# Patient Record
Sex: Female | Born: 1935 | Race: White | Hispanic: No | Smoking: Former smoker
Health system: Southern US, Community
[De-identification: ages and names within clinical notes are randomized; demographics above are authoritative.]

## PROBLEM LIST (undated history)

## (undated) DIAGNOSIS — K279 Peptic ulcer, site unspecified, unspecified as acute or chronic, without hemorrhage or perforation: Secondary | ICD-10-CM

## (undated) DIAGNOSIS — I639 Cerebral infarction, unspecified: Secondary | ICD-10-CM

## (undated) DIAGNOSIS — M4726 Other spondylosis with radiculopathy, lumbar region: Secondary | ICD-10-CM

## (undated) DIAGNOSIS — K449 Diaphragmatic hernia without obstruction or gangrene: Secondary | ICD-10-CM

## (undated) DIAGNOSIS — K219 Gastro-esophageal reflux disease without esophagitis: Secondary | ICD-10-CM

## (undated) DIAGNOSIS — M797 Fibromyalgia: Secondary | ICD-10-CM

## (undated) DIAGNOSIS — M1990 Unspecified osteoarthritis, unspecified site: Secondary | ICD-10-CM

## (undated) DIAGNOSIS — H409 Unspecified glaucoma: Secondary | ICD-10-CM

## (undated) DIAGNOSIS — IMO0001 Reserved for inherently not codable concepts without codable children: Secondary | ICD-10-CM

## (undated) DIAGNOSIS — R42 Dizziness and giddiness: Secondary | ICD-10-CM

## (undated) DIAGNOSIS — M4800 Spinal stenosis, site unspecified: Secondary | ICD-10-CM

## (undated) DIAGNOSIS — G459 Transient cerebral ischemic attack, unspecified: Secondary | ICD-10-CM

## (undated) HISTORY — DX: Spinal stenosis, site unspecified: M48.00

## (undated) HISTORY — DX: Reserved for inherently not codable concepts without codable children: IMO0001

## (undated) HISTORY — DX: Dizziness and giddiness: R42

## (undated) HISTORY — DX: Peptic ulcer, site unspecified, unspecified as acute or chronic, without hemorrhage or perforation: K27.9

## (undated) HISTORY — DX: Transient cerebral ischemic attack, unspecified: G45.9

## (undated) HISTORY — DX: Other spondylosis with radiculopathy, lumbar region: M47.26

## (undated) HISTORY — PX: APPENDECTOMY: SHX54

## (undated) HISTORY — PX: ABDOMINAL HYSTERECTOMY: SHX81

## (undated) HISTORY — DX: Diaphragmatic hernia without obstruction or gangrene: K44.9

## (undated) HISTORY — DX: Fibromyalgia: M79.7

## (undated) HISTORY — DX: Gastro-esophageal reflux disease without esophagitis: K21.9

## (undated) HISTORY — DX: Unspecified glaucoma: H40.9

## (undated) HISTORY — DX: Cerebral infarction, unspecified: I63.9

## (undated) HISTORY — DX: Unspecified osteoarthritis, unspecified site: M19.90

---

## 2006-01-07 ENCOUNTER — Ambulatory Visit: Payer: Self-pay | Admitting: Family Medicine

## 2007-11-16 ENCOUNTER — Ambulatory Visit: Payer: Self-pay | Admitting: Family Medicine

## 2008-05-18 ENCOUNTER — Ambulatory Visit: Payer: Self-pay | Admitting: Family Medicine

## 2008-07-12 ENCOUNTER — Ambulatory Visit: Payer: Self-pay | Admitting: Pain Medicine

## 2008-07-20 ENCOUNTER — Ambulatory Visit: Payer: Self-pay | Admitting: Pain Medicine

## 2008-08-03 ENCOUNTER — Ambulatory Visit: Payer: Self-pay | Admitting: Physician Assistant

## 2008-08-10 ENCOUNTER — Ambulatory Visit: Payer: Self-pay | Admitting: Pain Medicine

## 2008-08-30 ENCOUNTER — Ambulatory Visit: Payer: Self-pay | Admitting: Physician Assistant

## 2008-08-31 ENCOUNTER — Ambulatory Visit: Payer: Self-pay | Admitting: Pain Medicine

## 2008-09-20 ENCOUNTER — Ambulatory Visit: Payer: Self-pay | Admitting: Physician Assistant

## 2009-03-21 ENCOUNTER — Ambulatory Visit: Payer: Self-pay | Admitting: Family Medicine

## 2010-01-15 ENCOUNTER — Emergency Department: Payer: Self-pay | Admitting: Internal Medicine

## 2010-05-17 ENCOUNTER — Ambulatory Visit: Payer: Self-pay | Admitting: Family Medicine

## 2010-07-04 ENCOUNTER — Ambulatory Visit: Payer: Self-pay

## 2010-07-17 ENCOUNTER — Ambulatory Visit: Payer: Self-pay | Admitting: Pain Medicine

## 2010-07-18 ENCOUNTER — Ambulatory Visit: Payer: Self-pay | Admitting: Pain Medicine

## 2010-08-05 ENCOUNTER — Ambulatory Visit: Payer: Self-pay | Admitting: Pain Medicine

## 2010-08-08 ENCOUNTER — Ambulatory Visit: Payer: Self-pay | Admitting: Pain Medicine

## 2010-09-16 ENCOUNTER — Ambulatory Visit: Payer: Self-pay | Admitting: Pain Medicine

## 2010-09-19 ENCOUNTER — Ambulatory Visit: Payer: Self-pay | Admitting: Pain Medicine

## 2010-10-07 ENCOUNTER — Ambulatory Visit: Payer: Self-pay | Admitting: Pain Medicine

## 2010-12-10 ENCOUNTER — Ambulatory Visit: Payer: Self-pay | Admitting: Pain Medicine

## 2010-12-23 ENCOUNTER — Ambulatory Visit: Payer: Self-pay | Admitting: Pain Medicine

## 2010-12-31 ENCOUNTER — Ambulatory Visit: Payer: Self-pay | Admitting: Pain Medicine

## 2011-01-28 ENCOUNTER — Ambulatory Visit: Payer: Self-pay | Admitting: Pain Medicine

## 2011-02-10 ENCOUNTER — Ambulatory Visit: Payer: Self-pay | Admitting: Pain Medicine

## 2011-04-22 ENCOUNTER — Ambulatory Visit: Payer: Self-pay | Admitting: Pain Medicine

## 2011-05-20 ENCOUNTER — Ambulatory Visit: Payer: Self-pay | Admitting: Pain Medicine

## 2011-10-08 ENCOUNTER — Ambulatory Visit: Payer: Self-pay | Admitting: Pain Medicine

## 2011-10-09 ENCOUNTER — Ambulatory Visit: Payer: Self-pay | Admitting: Pain Medicine

## 2011-10-27 ENCOUNTER — Ambulatory Visit: Payer: Self-pay | Admitting: Pain Medicine

## 2011-10-30 ENCOUNTER — Ambulatory Visit: Payer: Self-pay | Admitting: Pain Medicine

## 2011-11-26 ENCOUNTER — Ambulatory Visit: Payer: Self-pay | Admitting: Pain Medicine

## 2011-12-04 ENCOUNTER — Ambulatory Visit: Payer: Self-pay | Admitting: Pain Medicine

## 2011-12-22 ENCOUNTER — Ambulatory Visit: Payer: Self-pay | Admitting: Pain Medicine

## 2011-12-30 ENCOUNTER — Inpatient Hospital Stay: Payer: Self-pay | Admitting: Internal Medicine

## 2011-12-30 LAB — PROTIME-INR: Prothrombin Time: 13.9 secs (ref 11.5–14.7)

## 2011-12-30 LAB — COMPREHENSIVE METABOLIC PANEL
Anion Gap: 14 (ref 7–16)
BUN: 14 mg/dL (ref 7–18)
Bilirubin,Total: 0.3 mg/dL (ref 0.2–1.0)
Chloride: 105 mmol/L (ref 98–107)
Co2: 25 mmol/L (ref 21–32)
Creatinine: 0.89 mg/dL (ref 0.60–1.30)
EGFR (African American): >60
Osmolality: 288 (ref 275–301)
Potassium: 3.5 mmol/L (ref 3.5–5.1)
SGPT (ALT): 14 U/L
Total Protein: 7.1 g/dL (ref 6.4–8.2)

## 2011-12-30 LAB — URINALYSIS, COMPLETE
Bacteria: NONE SEEN
Bilirubin,UR: NEGATIVE
Blood: NEGATIVE
Glucose,UR: NEGATIVE mg/dL (ref 0–75)
Ketone: NEGATIVE
Nitrite: NEGATIVE
Ph: 5 (ref 4.5–8.0)
Specific Gravity: 1.029 (ref 1.003–1.030)
Squamous Epithelial: 9
WBC UR: 6 /HPF (ref 0–5)

## 2011-12-30 LAB — TROPONIN I: Troponin-I: <0.02 ng/mL

## 2011-12-30 LAB — CBC
HCT: 28.7 % — ABNORMAL LOW (ref 35.0–47.0)
HGB: 9.1 g/dL — ABNORMAL LOW (ref 12.0–16.0)
MCH: 23.5 pg — ABNORMAL LOW (ref 26.0–34.0)
MCHC: 31.8 g/dL — ABNORMAL LOW (ref 32.0–36.0)
MCV: 74 fL — ABNORMAL LOW (ref 80–100)
Platelet: 334 10*3/uL (ref 150–440)
RBC: 3.88 10*6/uL (ref 3.80–5.20)
WBC: 9.1 10*3/uL (ref 3.6–11.0)

## 2011-12-30 LAB — LIPASE, BLOOD: Lipase: 139 U/L (ref 73–393)

## 2011-12-30 LAB — HEMOGLOBIN: HGB: 7.9 g/dL — ABNORMAL LOW (ref 12.0–16.0)

## 2011-12-31 LAB — BASIC METABOLIC PANEL
Anion Gap: 11 (ref 7–16)
BUN: 8 mg/dL (ref 7–18)
Creatinine: 0.84 mg/dL (ref 0.60–1.30)
EGFR (African American): >60
EGFR (Non-African Amer.): >60
Glucose: 89 mg/dL (ref 65–99)
Osmolality: 283 (ref 275–301)

## 2011-12-31 LAB — CBC WITH DIFFERENTIAL/PLATELET
Basophil %: 0.4 %
Eosinophil #: 0.4 10*3/uL (ref 0.0–0.7)
HGB: 8.9 g/dL — ABNORMAL LOW (ref 12.0–16.0)
Lymphocyte %: 28.8 %
MCH: 24.4 pg — ABNORMAL LOW (ref 26.0–34.0)
Monocyte #: 0.8 10*3/uL — ABNORMAL HIGH (ref 0.0–0.7)
Neutrophil %: 54.6 %
Platelet: 273 10*3/uL (ref 150–440)
RBC: 3.65 10*6/uL — ABNORMAL LOW (ref 3.80–5.20)
WBC: 7.4 10*3/uL (ref 3.6–11.0)

## 2011-12-31 LAB — MAGNESIUM: Magnesium: 1.7 mg/dL — ABNORMAL LOW

## 2012-01-02 LAB — PATHOLOGY REPORT

## 2012-02-16 ENCOUNTER — Ambulatory Visit: Payer: Self-pay | Admitting: Pain Medicine

## 2012-02-19 ENCOUNTER — Ambulatory Visit: Payer: Self-pay | Admitting: Pain Medicine

## 2012-03-04 ENCOUNTER — Ambulatory Visit: Payer: Self-pay | Admitting: Family Medicine

## 2012-03-10 ENCOUNTER — Ambulatory Visit: Payer: Self-pay | Admitting: Pain Medicine

## 2012-04-29 ENCOUNTER — Ambulatory Visit: Payer: Self-pay | Admitting: Pain Medicine

## 2012-05-24 ENCOUNTER — Ambulatory Visit: Payer: Self-pay | Admitting: Pain Medicine

## 2012-09-08 ENCOUNTER — Ambulatory Visit: Payer: Self-pay | Admitting: Pain Medicine

## 2012-09-14 ENCOUNTER — Ambulatory Visit: Payer: Self-pay | Admitting: Pain Medicine

## 2012-10-13 ENCOUNTER — Ambulatory Visit: Payer: Self-pay | Admitting: Pain Medicine

## 2012-11-01 ENCOUNTER — Ambulatory Visit: Payer: Self-pay | Admitting: Internal Medicine

## 2013-03-03 ENCOUNTER — Ambulatory Visit: Payer: Self-pay | Admitting: Pain Medicine

## 2013-03-09 ENCOUNTER — Ambulatory Visit: Payer: Self-pay | Admitting: Pain Medicine

## 2013-06-22 ENCOUNTER — Ambulatory Visit: Payer: Self-pay | Admitting: Gastroenterology

## 2013-07-01 ENCOUNTER — Ambulatory Visit: Payer: Self-pay | Admitting: Pain Medicine

## 2013-07-14 ENCOUNTER — Ambulatory Visit: Payer: Self-pay | Admitting: Pain Medicine

## 2013-08-08 ENCOUNTER — Ambulatory Visit: Payer: Self-pay | Admitting: Pain Medicine

## 2013-10-27 ENCOUNTER — Ambulatory Visit: Payer: Self-pay | Admitting: Pain Medicine

## 2013-12-02 ENCOUNTER — Ambulatory Visit: Payer: Self-pay | Admitting: Pain Medicine

## 2013-12-15 ENCOUNTER — Ambulatory Visit: Payer: Self-pay | Admitting: Pain Medicine

## 2013-12-26 ENCOUNTER — Ambulatory Visit: Payer: Self-pay | Admitting: Pain Medicine

## 2014-03-16 ENCOUNTER — Ambulatory Visit: Payer: Self-pay | Admitting: Pain Medicine

## 2014-03-24 ENCOUNTER — Ambulatory Visit: Payer: Self-pay | Admitting: Pain Medicine

## 2014-04-27 ENCOUNTER — Inpatient Hospital Stay: Payer: Self-pay | Admitting: Internal Medicine

## 2014-04-27 LAB — HEMOGLOBIN
HGB: 12.3 g/dL (ref 12.0–16.0)
HGB: 11.5 g/dL — ABNORMAL LOW (ref 12.0–16.0)

## 2014-04-27 LAB — URINALYSIS, COMPLETE
Bilirubin,UR: NEGATIVE
Blood: NEGATIVE
Glucose,UR: NEGATIVE mg/dL (ref 0–75)
KETONE: NEGATIVE
LEUKOCYTE ESTERASE: NEGATIVE
Nitrite: NEGATIVE
PROTEIN: NEGATIVE
Ph: 7 (ref 4.5–8.0)
RBC,UR: 1 /HPF (ref 0–5)
Specific Gravity: 1.017 (ref 1.003–1.030)
Squamous Epithelial: <1
WBC UR: NONE SEEN /HPF (ref 0–5)

## 2014-04-27 LAB — COMPREHENSIVE METABOLIC PANEL
ANION GAP: 8 (ref 7–16)
AST: 22 U/L (ref 15–37)
Albumin: 3.2 g/dL — ABNORMAL LOW (ref 3.4–5.0)
Alkaline Phosphatase: 92 U/L
BUN: 30 mg/dL — ABNORMAL HIGH (ref 7–18)
Bilirubin,Total: 0.4 mg/dL (ref 0.2–1.0)
CALCIUM: 9.6 mg/dL (ref 8.5–10.1)
CO2: 26 mmol/L (ref 21–32)
Chloride: 106 mmol/L (ref 98–107)
Creatinine: 0.91 mg/dL (ref 0.60–1.30)
EGFR (African American): >60
Glucose: 123 mg/dL — ABNORMAL HIGH (ref 65–99)
OSMOLALITY: 287 (ref 275–301)
POTASSIUM: 4.3 mmol/L (ref 3.5–5.1)
SGPT (ALT): 12 U/L (ref 12–78)
Sodium: 140 mmol/L (ref 136–145)
TOTAL PROTEIN: 7.1 g/dL (ref 6.4–8.2)

## 2014-04-27 LAB — LIPASE, BLOOD: LIPASE: 139 U/L (ref 73–393)

## 2014-04-27 LAB — CBC WITH DIFFERENTIAL/PLATELET
BASOS PCT: 0.6 %
Basophil #: 0.1 10*3/uL (ref 0.0–0.1)
Eosinophil #: 0.3 10*3/uL (ref 0.0–0.7)
Eosinophil %: 1.7 %
HCT: 39.8 % (ref 35.0–47.0)
HGB: 12.9 g/dL (ref 12.0–16.0)
Lymphocyte #: 3.2 10*3/uL (ref 1.0–3.6)
Lymphocyte %: 21.1 %
MCH: 27.2 pg (ref 26.0–34.0)
MCHC: 32.5 g/dL (ref 32.0–36.0)
MCV: 84 fL (ref 80–100)
MONOS PCT: 7.1 %
Monocyte #: 1.1 x10 3/mm — ABNORMAL HIGH (ref 0.2–0.9)
Neutrophil #: 10.5 10*3/uL — ABNORMAL HIGH (ref 1.4–6.5)
Neutrophil %: 69.5 %
Platelet: 341 10*3/uL (ref 150–440)
RBC: 4.75 10*6/uL (ref 3.80–5.20)
RDW: 15.4 % — ABNORMAL HIGH (ref 11.5–14.5)
WBC: 15.1 10*3/uL — AB (ref 3.6–11.0)

## 2014-04-27 LAB — HEMATOCRIT
HCT: 38.1 % (ref 35.0–47.0)
HCT: 38.1 % (ref 35.0–47.0)

## 2014-04-27 LAB — TROPONIN I: Troponin-I: <0.02 ng/mL

## 2014-04-28 LAB — BASIC METABOLIC PANEL
Anion Gap: 5 — ABNORMAL LOW (ref 7–16)
BUN: 21 mg/dL — ABNORMAL HIGH (ref 7–18)
CHLORIDE: 115 mmol/L — AB (ref 98–107)
CO2: 23 mmol/L (ref 21–32)
Calcium, Total: 8 mg/dL — ABNORMAL LOW (ref 8.5–10.1)
Creatinine: 0.89 mg/dL (ref 0.60–1.30)
Glucose: 113 mg/dL — ABNORMAL HIGH (ref 65–99)
Osmolality: 289 (ref 275–301)
Potassium: 4 mmol/L (ref 3.5–5.1)
Sodium: 143 mmol/L (ref 136–145)

## 2014-04-28 LAB — CBC WITH DIFFERENTIAL/PLATELET
BASOS ABS: 0.1 10*3/uL (ref 0.0–0.1)
Basophil %: 0.6 %
Eosinophil #: 0.4 10*3/uL (ref 0.0–0.7)
Eosinophil %: 4.3 %
HCT: 30.3 % — AB (ref 35.0–47.0)
HGB: 10 g/dL — AB (ref 12.0–16.0)
LYMPHS PCT: 32.9 %
Lymphocyte #: 2.9 10*3/uL (ref 1.0–3.6)
MCH: 28.1 pg (ref 26.0–34.0)
MCHC: 33 g/dL (ref 32.0–36.0)
MCV: 85 fL (ref 80–100)
MONOS PCT: 5.8 %
Monocyte #: 0.5 x10 3/mm (ref 0.2–0.9)
NEUTROS ABS: 4.9 10*3/uL (ref 1.4–6.5)
Neutrophil %: 56.4 %
PLATELETS: 257 10*3/uL (ref 150–440)
RBC: 3.56 10*6/uL — AB (ref 3.80–5.20)
RDW: 16 % — AB (ref 11.5–14.5)
WBC: 8.7 10*3/uL (ref 3.6–11.0)

## 2014-06-29 ENCOUNTER — Ambulatory Visit: Payer: Self-pay | Admitting: Pain Medicine

## 2014-07-21 ENCOUNTER — Ambulatory Visit: Payer: Self-pay | Admitting: Pain Medicine

## 2014-09-28 ENCOUNTER — Ambulatory Visit: Payer: Self-pay | Admitting: Pain Medicine

## 2015-03-03 NOTE — H&P (Signed)
PATIENT NAME:  Cynthia Bennett, Cynthia Bennett MR#:  [id number] DATE OF BIRTH:  23-Feb-1933  DATE OF ADMISSION:  04/27/2014  PRIMARY CARE PHYSICIAN: Rhona LeavensJames F. Burnett ShengHedrick, MD   REFERRING PHYSICIAN: Enedina Finnerandolph N. Manson PasseyBrown, MD  CHIEF COMPLAINT: Coffee-ground emesis and melena.   HISTORY OF PRESENT ILLNESS: The patient is a 81 year old female with past medical history of hyperlipidemia, fibromyalgia, diagnosed with reflux esophagitis and GERD in August 2014 via EGD, status post colonoscopy, who is presenting to the ED with a chief complaint of epigastric discomfort associated with burning and soreness and started having coffee-ground emesis and dark-colored stool. Last night, she had 2 episodes of coffee-ground emesis, one at 10:00 p.m. and the other one at 12:00 midnight. The patient and daughter were concerned, and the patient was brought into the ED. She has been diagnosed with upper GI bleed. Initial hemoglobin is stable at around 12. Protonix bolus was given, and Protonix drip is initiated. The patient is started on IV fluids. Hospitalist team is called to admit the patient. During my examination, the patient is resting comfortably. Denies any fresh blood in her stool or vomit. No fevers. Denies any dizziness or loss of consciousness. No other complaints.   PAST MEDICAL HISTORY: Chronic history of glaucoma, osteoarthritis, fibromyalgia, history of TIA.   PAST SURGICAL HISTORY: Appendectomy, partial hysterectomy.   ALLERGIES: No known drug allergies.  PSYCHOSOCIAL HISTORY: Lives at home, lives alone. No history of smoking, alcohol or illicit drug usage.  FAMILY HISTORY: Hyperlipidemia runs in her family.   HOME MEDICATIONS:  1. Atorvastatin 20 mg p.o. at bedtime. 2. Omeprazole 20 mg p.o. Bennett.i.d. 3. Norco 325/5 one tablet p.o. 1 to 3 times a day as needed.  4. Doxepin 100 mg 1 capsule p.o. once daily.    REVIEW OF SYSTEMS: CONSTITUTIONAL: Denies any fevers. Complaining of fatigue and weakness.  EYES: Denies blurry  vision, double vision. Has history of glaucoma. ENT: Denies epistaxis, discharge, tinnitus.  RESPIRATION: Denies cough, COPD.  CARDIOVASCULAR: Denies any chest pain, palpitations, dizziness or peripheral edema.  GASTROINTESTINAL: Complaining of nausea, vomiting coffee-ground blood. Having epigastric abdominal discomfort and noticed black stool.  NEUROLOGIC: Denies any vertigo. Had past history of TIA. No history of stroke.  MUSCULOSKELETAL: Has chronic history of osteoarthritis. Denies any joint pain, swelling. Has chronic pain syndrome.  SKIN: Warm to touch. No rashes and no lesions.  PSYCHIATRIC: Normal mood and affect.   PHYSICAL EXAMINATION:  VITAL SIGNS: Temperature 98.2, pulse 100, respirations 20, blood pressure 124/72, pulse oximetry is 96%.  GENERAL APPEARANCE: Not under acute distress. Moderately built and nourished.  HEENT: Normocephalic, atraumatic. Pupils are equally reacting to light and accommodation. No scleral icterus. No conjunctival injection. No sinus tenderness. No postnasal drip. Moist mucous membranes.  NECK: Supple. No JVD. No thyromegaly. Range of motion is intact.  LUNGS: Clear to auscultation bilaterally. No accessory muscle usage. No anterior chest Gillyard tenderness on palpation.  CARDIAC: S1, S2 normal. Regular rate and rhythm. No murmurs. GASTROINTESTINAL: Soft. Bowel sounds are positive in all 4 quadrants. Minimal tenderness is present in the epigastric area. No rebound tenderness. No masses felt.  NEUROLOGIC: Awake, alert, oriented x3. Cranial nerves II through XI are intact. Motor and sensory are intact. Reflexes are 2+.  EXTREMITIES: No edema. No cyanosis. No clubbing.  SKIN: Warm to touch. Normal turgor. No rashes. No lesions  MUSCULOSKELETAL: No joint effusion, tenderness, erythema.  PSYCHIATRIC: Normal mood and affect.  LABORATORY AND IMAGING STUDIES: Troponin less than 0.02. LFTs are normal except albumin at  3.2. Chem-8: Glucose 123, BUN at 30, the rest of  the Chem-8 is normal. CBC: WBC is elevated at 15.1. The rest of the CBC is normal.  ASSESSMENT AND PLAN: A 81 year old female coming to the ER with a chief complaint of epigastric discomfort associated with coffee-ground emesis and black tarry stool. Will be admitted with the following assessment and plan.   1. Upper gastrointestinal bleed, probably from peptic ulcer disease, status post EGD and colonoscopy in August 2014 which has revealed reflux esophagitis. Will keep her n.p.o. Provide IV fluids and proton pump inhibitor gtt and GI consult. Monitor hemoglobin and hematocrit q.6 hours. Typing and screening is done.  2. History of hyperlipidemia. The patient being n.p.o., home medications are on hold.  3. History of transient ischemic attack. No residual defects. Will hold off aspirin in view of gastrointestinal bleed.  4. Fibromyalgia. Will provide pain management as-needed basis, holding all p.o. medications. 5. Will provide gastrointestinal prophylaxis with Protonix and deep vein thrombosis prophylaxis with SCDs.   CODE STATUS: She is full code. Daughter is the medical power of attorney.   Plan of care discussed in detail with the patient and her daughter at bedside. They both verbalized understanding of the plan.   TOTAL TIME SPENT ON ADMISSION: 50 minutes.   ____________________________ Ramonita Lab, MD ag:lb D: 04/27/2014 06:57:00 ET T: 04/27/2014 07:26:32 ET JOB#: [id number]  cc: Ramonita Lab, MD, <Dictator> Ramonita Lab MD ELECTRONICALLY SIGNED 04/30/2014 0:59

## 2015-03-03 NOTE — Discharge Summary (Signed)
PATIENT NAME:  Cynthia Bennett, Cynthia Bennett MR#:  [id number] DATE OF BIRTH:  06/22/1933  DATE OF ADMISSION:  04/27/2014 DATE OF DISCHARGE:  04/28/2014  ADMITTING DIAGNOSIS:  Hematemesis.   DISCHARGE DIAGNOSES:  1.  Hematemesis status post esophagogastroduodenoscopy on 04/27/2014 by Dr. Bluford Kaufmannh; LA grade C reflux esophagitis noted.  2.  Acute posthemorrhagic anemia. 3.  Hypovolemia, resolved on intravenous fluids.  4.  Hyperlipidemia.   5.  History of glaucoma.  6.  Osteoarthritis. 7.  Fibromyalgia.  8.  Transient ischemic attack. 9.  Upper gastrointestinal bleed in the past. 10.  Colonic polyps.  11.  Diverticulosis.  12.  Spinal stenosis.  13.  History of Barrett's esophagus.  The patient was recommended to repeat esophagogastroduodenoscopy in three years.    MEDICATIONS:  1.  Sertraline 100 mg by mouth twice daily. 2.  Doxepin 100 mg by mouth at bedtime.  3.  Atorvastatin 20 mg by mouth at bedtime.  4.  Dorzolamide/Timolol ophthalmic solution one drop to each affected eye twice daily.  5.  Norco 325/5 mg 1 tablet 3 times daily as needed.  6.  Omeprazole 40 mg by mouth twice daily.  7.  Carafate 1 gram 4 times daily before meals and at bedtime.   HOME OXYGEN:  None.   DIET:  2 gram salt, low-fat, low-cholesterol, mechanical soft.   ACTIVITY LIMITATIONS:  As tolerated.    FOLLOWUP APPOINTMENT:  With Dr. Burnett ShengHedrick in two days after discharge, Dr. Bluford Kaufmannh in one week after discharge.  CONSULTANTS:  Dr. Bluford Kaufmannh.   RADIOLOGIC STUDIES:  None.   HOSPITAL COURSE:  The patient is a 81 year old Caucasian female with past medical history significant for history of GI bleed in the past, dilated esophagus, who presents to the hospital with coffee-ground emesis as well as melena.  Please refer to Dr. Rob HickmanGouru's admission note on 04/27/2014.  On arrival to the hospital, the patient's vital signs, temperature was 98.2, pulse was 100, respiratory rate was 20, blood pressure 124/72, saturation was 96% on room air.  Physical  exam was unremarkable.  The patient's lab data done on arrival to the hospital revealed mild elevation of BUN to 30, glucose 123.  Otherwise BMP was unremarkable.  Lipase level was 139.  Liver enzymes:  Albumin level of 3.2, otherwise unremarkable.  Troponin level was less than 0.02.  White blood cell count was elevated to 15.1, hemoglobin was 12.9, platelet count was 341, absolute neutrophil count was 10.5.  Urinalysis was unremarkable.  EKG showed sinus tach at 119 beats per minute, left axis deviation, nonspecific ST-T changes.  The patient was admitted to the hospital for further evaluation.  Her hemoglobin level was cycled and the patient was rehydrated with rehydration.  Her hemoglobin level dropped down to 10.0 by 04/28/2014.  The patient was evaluated by Dr. Bluford Kaufmannh who felt that the patient would benefit from EGD.  The patient underwent EGD on 04/27/2014 which revealed as mentioned above, LA grade C reflux esophagitis.  The examination was otherwise normal.  Hiatal hernia was noted and normal examined duodenum.  The patient was recommended to resume high doses of proton pump inhibitor to be taken twice a day.  She is to follow up with her primary care physician Dr. Burnett ShengHedrick in the next few days after discharge as well as Dr. Bluford Kaufmannh.  On the day of discharge, the patient's vital signs, temperature was 98.3, pulse of 86, respiration rate was 17, blood pressure 116/80, saturation was 97% to 100% on room air at rest.  TIME SPENT:  40 minutes.     ____________________________ Katharina Caper, MD rv:ea D: 04/28/2014 19:18:04 ET T: 04/29/2014 05:01:06 ET JOB#: [id number]  cc: Katharina Caper, MD, <Dictator> Rhona Leavens. Burnett Sheng, MD Katharina Caper MD ELECTRONICALLY SIGNED 05/06/2014 18:48

## 2015-03-03 NOTE — Consult Note (Signed)
PATIENT NAME:  Cynthia Bennett, Aisling B MR#:  [id number] DATE OF BIRTH:  [birth year]  DATE OF CONSULTATION:  04/27/2014  REFERRING PHYSICIAN:   CONSULTING PHYSICIAN:  Rodman Keyawn S. Harrison, NP  PRIMARY CARE PHYSICIAN:  Dr. Jerl MinaJames Hedrick.  ATTENDING PHYSICIAN: Dr. Almetta LovelyVickute.  DEPARTMENT CONSULTING:  [facility], Rodman Keyawn S. Harrison, NP and Dr. Lutricia FeilPaul Oh.  REASON FOR CONSULTATION: Melena.   HISTORY OF PRESENT ILLNESS:  [name] is a very pleasant 81 year old Caucasian female with a past medical history of glaucoma, osteoarthritis, fibromyalgia, TIA, and upper GI bleed.   [facility] Room after the occurrence of two episodes of coffee-ground-like emesis yesterday evening and one occurrence this morning. This morning's episode was about 100 mL. She denies taking iron or Pepto-Bismol. She is currently not taking any anti-inflammatory therapy. That has been held for several years since previously been told that she had an upper GI bleed. A review of EMR, she had an EGD, as well as a colonoscopy that was done August 2014. EGD was unremarkable. Colonoscopy revealed colonic polyps, as well as diverticulosis.   The patient does states that she ran out of omeprazole 20 mg, which she was taking again twice a day for about 48 hours prior to the onset of symptoms. She was experiencing heartburn, as well as epigastric discomfort. Bowels normally move on average every day. Last bowel movement was yesterday morning. She has had a 40 pound weight loss over the past couple of years. The patient's family members and daughters who are present feel it is possibly related to grieving as she lost her husband a couple of years ago. She does continue to consume foods which are known triggers to reflux. Eats chocolate on a daily basis.   Currently complaining of being fatigued. No chest pain. No shortness of breath and no dizziness.  CURRENT LABORATORY DATA: The patient's hemoglobin has remained stable. In trending, 12.9 on admission  with hematocrit of 39.8. Currently 12.3 with hematocrit of 38.1. White count was elevated at 15.1 on admission.   PAST MEDICAL HISTORY: Chronic, glaucoma, osteoarthritis, fibromyalgia, upper GI bleed, TIA, colonic polyps, diverticulosis, and spinal stenosis.   PAST SURGICAL HISTORY: Appendectomy, partial hysterectomy, colonoscopy August 2014, as well as an upper endoscopy at that time.   ALLERGIES: NONE.   HOME MEDICATIONS: Zoloft 100 mg twice a day, omeprazole 20 mg twice a day,  Norco 325/5 mg 1 tablet 1-3 times a day as needed, average, and it has been twice a day recently. Doxepin 100 mg 1 capsule daily and atorvastatin 20 mg at bedtime.   SOCIAL HISTORY: Resides at home, widow. No tobacco and no alcohol use.   FAMILY HISTORY: Hyperlipidemia.   REVIEW OF SYSTEMS: CONSTITUTIONAL: Significant for fatigue and weakness. No fevers.  EYES: No blurred vision, double vision. History of glaucoma.  ENT: No epistaxis, discharge, or tinnitus.  PULMONARY: No shortness of breath, coughing, or wheezing.  CARDIOVASCULAR: No chest pain or heart palpitations. No syncopal episodes.  GASTROINTESTINAL: See history of present illness.  GENITOURINARY: No dysuria, hematuria, or nocturia.  NEUROLOGIC: No history of CVA. History of TIA. No history of seizure disorder.  MUSCULOSKELETAL: Chronic back pain. History of spinal stenosis.  SKIN: No lesions. No rashes.  PSYCHOLOGY: No anxiety. Possible depression related to grieving process.   PHYSICAL EXAMINATION:  VITAL SIGNS: Temperature is 98.1 with a pulse of 111, respirations are 18, blood pressure is 120/76, pulse oximetry is 97% on room air.  GENERAL: Well-developed, well-nourished, 81 year old Caucasian female in no  acute distress noted. Pleasant.  HEENT: Normocephalic, atraumatic. Pupils equal and reactive to light. Conjunctivae clear. Sclerae anicteric.  NECK: Supple. Trachea midline. No lymphadenopathy or thyromegaly.  PULMONARY: Symmetric rise and  fall of chest. Clear to auscultation throughout.  CARDIOVASCULAR: Regular rhythm, S1, S2. No murmurs, no gallops.  ABDOMEN: Soft, nondistended. Bowel sounds in four quadrants. No bruits. No masses.  RECTAL: Deferred.  MUSCULOSKELETAL: Moving all four extremities. No contractures. No clubbing.  EXTREMITIES: No edema.  PSYCHIATRIC: Alert and oriented x 4. Memory grossly intact. Appropriate affect and mood.  NEUROLOGICAL: No gross neurological deficits.   LABORATORY AND DIAGNOSTIC DATA: CBC results as noted above. Again, white count was elevated at 15.1 on admission. Hepatic panel: Albumin low at 3.2, otherwise, within normal limits. Troponin less than 0.02. Chemistry glucose 123 with a BUN elevated at 30, otherwise within normal limits. Antibody screen is negative. ABO group plus Rh type is O+.   IMPRESSION:  1.  Melena. Known history of upper gastrointestinal bleed.  2.  Known history of reflux and epigastric abdominal discomfort.  PLAN: The patient's presentation will be discussed with Dr. Lutricia Feil. Recommendation to follow in addendum form.   The patient was seen in collaboration with  Dr. Lutricia Feil.     ____________________________ Rodman Key, NP dsh:ts D: 04/27/2014 12:58:34 ET T: 04/27/2014 13:52:15 ET JOB#: [id number]  cc: Rodman Key, NP, <Dictator> Rodman Key MD ELECTRONICALLY SIGNED 04/27/2014 17:23

## 2015-03-03 NOTE — Consult Note (Signed)
Pt seen and examined. See Dawn Harrison's notes. EGD done. No active bleeding. Grade 3-4 reflux esophagitis. Hiatal hernia seen. No bleeding ulcers. Full liquid diet ordered. Make sure to keep patient on PPI BID even after discharge. Due to hx of Barrett's, repeat EGD in 3 yrs. thanks  Electronic Signatures: Lutricia Feilh, Paul (MD)  (Signed on 18-Jun-15 14:22)  Authored  Last Updated: 18-Jun-15 14:22 by Lutricia Feilh, Paul (MD)

## 2015-03-03 NOTE — Consult Note (Signed)
Chief Complaint:  Subjective/Chief Complaint Doing well. No further vomiting. No abd pain. No melena. Normal color stool this AM.   VITAL SIGNS/ANCILLARY NOTES: **Vital Signs.:   19-Jun-15 07:48  Vital Signs Type Q 8hr  Temperature Temperature (F) 98.3  Celsius 36.8  Temperature Source oral  Pulse Pulse 86  Respirations Respirations 17  Systolic BP Systolic BP 244  Diastolic BP (mmHg) Diastolic BP (mmHg) 80  Mean BP 92  Pulse Ox % Pulse Ox % 97  Pulse Ox Activity Level  At rest  Oxygen Delivery Room Air/ 21 %   Brief Assessment:  GEN no acute distress   Cardiac Regular   Respiratory clear BS   Gastrointestinal Normal   Lab Results: Routine Chem:  19-Jun-15 05:41   Glucose, Serum  113  BUN  21  Creatinine (comp) 0.89  Sodium, Serum 143  Potassium, Serum 4.0  Chloride, Serum  115  CO2, Serum 23  Calcium (Total), Serum  8.0  Anion Gap  5  Osmolality (calc) 289  eGFR (African American) >60  eGFR (Non-African American) >60 (eGFR values <93m/min/1.73 m2 may be an indication of chronic kidney disease (CKD). Calculated eGFR is useful in patients with stable renal function. The eGFR calculation will not be reliable in acutely ill patients when serum creatinine is changing rapidly. It is not useful in  patients on dialysis. The eGFR calculation may not be applicable to patients at the low and high extremes of body sizes, pregnant women, and vegetarians.)  Routine Hem:  19-Jun-15 05:41   WBC (CBC) 8.7  RBC (CBC)  3.56  Hemoglobin (CBC)  10.0  Hematocrit (CBC)  30.3  Platelet Count (CBC) 257  MCV 85  MCH 28.1  MCHC 33.0  RDW  16.0  Neutrophil % 56.4  Lymphocyte % 32.9  Monocyte % 5.8  Eosinophil % 4.3  Basophil % 0.6  Neutrophil # 4.9  Lymphocyte # 2.9  Monocyte # 0.5  Eosinophil # 0.4  Basophil # 0.1 (Result(s) reported on 28 Apr 2014 at 06:29AM.)   Assessment/Plan:  Assessment/Plan:  Assessment Esophagitis. Though hgb did drop today, no signs of  further bleeding.   Plan Stable for discharge from GI point of view. Make sure patient stays on PPI BID. Will sign off. Thanks.   Electronic Signatures: OVerdie Shire(MD)  (Signed 19-Jun-15 11:38)  Authored: Chief Complaint, VITAL SIGNS/ANCILLARY NOTES, Brief Assessment, Lab Results, Assessment/Plan   Last Updated: 19-Jun-15 11:38 by OVerdie Shire(MD)

## 2015-03-03 NOTE — Consult Note (Signed)
Brief Consult Note: Diagnosis: Melena, History of upper GI bleed,  Reflux and epigastric abdominal pain.   Consult note dictated.   Comments: Patient's presentation will be discussed with Dr. Lutricia FeilPaul Oh.  Addendum to follow.  Addendum:  Spoke with Dr. Bluford Kaufmannh and plan is to proceed with EGD this afternoon to allow direct luminal evaluation of upper GI tract to assess for source of blood loss.  Procedure, risks versus benefits discussed.  Scheduled for this afternoon.  Order placed.  Remain NPO at this time.  Continue with Protonix gtt. Will continue to monitor condition.  Recommend continued serial monitoring of hemoglobin and transfuse as necessary.  Note error in documented EGD report in H &P. EGD did reveal evidence of mild severe esophagitis..  Electronic Signatures: Rodman KeyHarrison, Dawn S (NP)  (Signed 18-Jun-15 13:24)  Authored: Brief Consult Note   Last Updated: 18-Jun-15 13:24 by Rodman KeyHarrison, Dawn S (NP)

## 2015-03-04 NOTE — Consult Note (Signed)
PATIENT NAME:  Cynthia Bennett, Cynthia Bennett MR#:  [id number] DATE OF BIRTH:  04/14/1933  DATE OF CONSULTATION:  12/30/2011  REFERRING PHYSICIAN:  Dr. Luberta Mutter  CONSULTING PHYSICIAN:  Lurline Del, MD  PRIMARY CARE PHYSICIAN: Jerl Mina, MD   REASON FOR CONSULTATION: Melena, anemia.   HISTORY OF PRESENT ILLNESS: This is a 81 year old female with history of chronic pain, fibromyalgia, chronic anemia, asthma, arthritis, history of transient ischemic attacks, GI bleed in 2008 secondary to ibuprofen use. The patient came to the Emergency Room yesterday with a 2 to 3 day history of black stools as well as coffee-ground emesis. According to the patient, she was in her normal state of health until about four days ago when she started to notice dark black tarry stools. She has recently started to use Mobic. She was seen at [facility] and hemoglobin was 9.3 which was lower than her baseline of 11. Her Mobic was discontinued. She was started on ferrous sulfate and Phenergan and she also takes omeprazole at home. She continued to have dark stool and started to have some nausea with dark vomitus and was admitted with a diagnosis of acute upper GI bleed. The patient has not had any bowel movements since yesterday and the bowel movement she had yesterday was reported to be less dark than before. Apparently she vomited a small amount of coffee-ground material early this morning but does not feel nauseated and has not had any further vomiting since then. She is complaining of a significant amount of burning in the lower retrosternal area and epigastric region. Denies any other significant symptoms.   PAST MEDICAL HISTORY:  1. Chronic anemia. Apparently she had a colonoscopy three to four years ago probably at [facility] and, according to her, nothing was found.  2. History of arthritis. 3. Fibromyalgia. 4. Hemorrhoids. 5. History of pneumonia in the past. 6. Gastroesophageal reflux disease. 7. Transient ischemic  attacks.  8. Glaucoma.  9. Hyperlipidemia.   PAST SURGICAL HISTORY:  1. Appendectomy. 2. Bilateral tubal ligation.  3. Hysterectomy.   4. Lumbar decompression.   ALLERGIES: None.   MEDICATIONS AT HOME:  1. Aspirin 81 mg a day. 2. Doxepin 100 mg a day. 3. Duragesic patch. 4. Ferrous sulfate 325 mg b.i.d.  5. Phenergan as needed.  6. Mobic 15 mg a day which was recently discontinued.  7. Norco p.r.n.   8. Omeprazole 40 mg a day. 9. Sertraline 100 mg b.i.d.  10. Sucralfate. 11. Zolpidem 10 mg at bedtime.   FAMILY HISTORY: Positive for MI and stroke.   SOCIAL HISTORY: She does not smoke or drink.   REVIEW OF SYSTEMS: Quite unremarkable. She is feeling weak. She denies any chest pain or shortness of breath. She did report a presyncopal episode but no syncope. Review of systems is grossly negative.   PHYSICAL EXAMINATION:   GENERAL: Pale appearing female fully awake and alert. Does not appear to be in any acute distress.   VITAL SIGNS: Temperature 98.4, pulse 85, respirations 113/68.   LUNGS: Grossly clear to auscultation bilaterally with fair air entry.   CARDIOVASCULAR: Regular rate and rhythm.   ABDOMEN: Soft. Bowel sounds are positive. Mild epigastric tenderness was noted. No rebound or guarding. No hepatosplenomegaly or ascites.   EXTREMITIES: No edema.   NEUROLOGIC: She is fully awake, alert, and oriented and neurological examination appears to be unremarkable.   LABORATORY, DIAGNOSTIC, AND RADIOLOGICAL DATA: Hemoglobin was 9.1 on admission, was 8.1 this morning and 8 at around lunchtime. Another hemoglobin  is pending for 3 o'clock this afternoon. PT and INR within normal limits. Troponin less than 0.02. BUN and creatinine normal. Serum lipase normal at 139.   ASSESSMENT AND PLAN: The patient is with probable upper GI bleed most likely secondary to Mobic-induced gastric mucosal damage and peptic ulcer disease. There are no signs of active GI bleeding and her  hemoglobin and hematocrit seem to be stable after the initial drop. The patient has been started on IV Protonix. The patient is on a clear liquid diet. I agree with IV PPI. Follow hemoglobin and hematocrit and if her hemoglobin drops any further, I would recommend blood transfusion. Will plan on performing an upper GI endoscopy tomorrow morning as there are no signs of active GI bleeding and I would probably prefer her to have blood transfusion prior to endoscopy as her hemoglobin is expected to drop further. The procedure of upper GI endoscopy has been discussed with the patient in detail and she is in full agreement. Further recommendations to follow. Plan has been discussed with Dr. Luberta MutterKonidena.    Will follow.   ____________________________ Lurline DelShaukat Dezirae Service, MD si:drc D: 12/30/2011 11:59:41 ET T: 12/30/2011 12:09:52 ET JOB#: [id number]  cc: Lurline DelShaukat Odessie Polzin, MD, <Dictator> Rhona LeavensJames F. Burnett ShengHedrick, MD Lurline DelSHAUKAT Ninfa Giannelli MD ELECTRONICALLY SIGNED 12/31/2011 18:37

## 2015-03-04 NOTE — Consult Note (Signed)
Chief Complaint:   Subjective/Chief Complaint EGD showed severe ulcerative esophagitis. No blood or bleeding.  Recommendations: Soft diet. Continue PPI as OP. Anti reflux measures. Avoid Mobic or other NSAID's. Follow up with me in 2 weeks (order written). No further recommendations. Will sign off. Thanks.   Electronic Signatures: Lurline DelIftikhar, Bryceson Grape (MD)  (Signed [phone]-Feb-13 12:02)  Authored: Chief Complaint   Last Updated: 20-Feb-13 12:02 by Lurline DelIftikhar, Rosezetta Balderston (MD)

## 2015-03-04 NOTE — Discharge Summary (Signed)
PATIENT NAME:  Cynthia Bennett, Cynthia Bennett MR#:  [id number] DATE OF BIRTH:  03-23-1933  DATE OF ADMISSION:  12/30/2011 DATE OF DISCHARGE:  12/31/2011  PRIMARY CARE PHYSICIAN: Jerl MinaJames Hedrick, MD   GI DOCTOR: Dr. Niel HummerIftikhar   DISCHARGE DIAGNOSIS: Gastrointestinal bleed, likely upper GI in the setting of nonsteroidal anti-inflammatory use with EGD showing severe ulcerative esophagitis. She was recommended to stop NSAIDs and was started on PPI.   SECONDARY DIAGNOSES:  1. Asthma.  2. Anemia. 3. Arthritis.  4. Fibromyalgia.  5. History of hemorrhoids.  6. Gastroesophageal reflux disease.  7. TIA.  8. Glaucoma.  9. History of GI bleed. 10. Chronic pain.  11. Hyperlipidemia.   CONSULTATION: Dr. Niel HummerIftikhar from GI  PROCEDURE/RADIOLOGY: EGD on February 20th by Dr. Niel HummerIftikhar showed severe ulcerative esophagitis. No bleeding or obvious blood.   MAJOR LABORATORY PANEL: Urinalysis on admission was negative.   HISTORY AND SHORT HOSPITAL COURSE: The patient is a 81 year old female with above-mentioned medical problems who was admitted for possible GI bleed and anemia. She was found to have coffee-ground emesis and melena which was thought to be secondary to underlying acute GI bleed. GI consultation was obtained with Dr. Niel HummerIftikhar who recommended EGD which was performed on February 20th which showed severe erosive esophagitis which was thought to be secondary to her use of Mobic and other nonsteroidal anti-inflammatories for her chronic pain. She was recommended to stop all of those and she was started on twice a day proton pump inhibitor. She tolerated diet well and is being discharged home in stable condition.   On February 20th on the date of discharge, her vital signs are as follows: Temperature 98, heart rate 85 per minute, respirations 18 per minute, blood pressure 140/83 mmHg. She was saturating 93% on room air.   PERTINENT PHYSICAL EXAMINATION: CARDIOVASCULAR: S1, S2 normal. No murmur, rubs, or gallop. LUNGS:  Clear to auscultation bilaterally. No wheezing, rales, rhonchi, or crepitation. ABDOMEN: Soft, benign. NEUROLOGIC: Nonfocal examination. All other physical examination remained at baseline.   DISCHARGE MEDICATIONS:  1. Duragesic 12 one patch transdermally every 72 hours.  2. Norco 325/5 one tablet 1 to 3 times a day as needed.  3. Zolpidem 10 mg p.o. daily.  4. Doxepin 100 mg p.o. daily.  5. Iron sulfate 325 mg p.o. Bennett.i.d.  6. Sertraline 100 mg p.o. Bennett.i.d.  7. Sucralfate 1 gram p.o. 4 times a day.  8. Protonix 40 mg p.o. Bennett.i.d.    DISCHARGE DIET: Low sodium. Eat soft and light diet for the first meal. Avoid any fried and spicy food.   DISCHARGE ACTIVITY: As tolerated.   DISCHARGE INSTRUCTIONS AND FOLLOW-UP:  1. The patient was instructed to avoid any kind of nonsteroidal anti-inflammatory products including aspirin and Mobic until evaluated by her primary care physician or GI doctor, Dr. Niel HummerIftikhar, on follow-up visit. She was also instructed to hold omeprazole while taking Protonix.  2. She will need follow-up with Dr. Burnett ShengHedrick in 1 to 2 weeks and with Dr. Niel HummerIftikhar in 2 to 3 weeks.     TOTAL TIME TAKING CARE OF THIS PATIENT: 55 minutes.   ____________________________ Ellamae SiaVipul S. Sherryll BurgerShah, MD vss:drc D: 12/31/2011 22:34:00 ET T: 01/01/2012 10:58:18 ET JOB#: [id number]  cc: Jewell Ryans S. Sherryll BurgerShah, MD, <Dictator> Rhona LeavensJames F. Burnett ShengHedrick, MD Lurline DelShaukat Iftikhar, MD Ellamae SiaVIPUL S [facility] MD ELECTRONICALLY SIGNED 01/01/2012 18:34

## 2015-03-04 NOTE — H&P (Signed)
PATIENT NAME:  Cynthia Bennett, HEIGHT MR#:  [id number] DATE OF BIRTH:  29-Jun-1933  DATE OF ADMISSION:  12/30/2011  REFERRING PHYSICIAN: Dr. Marilynne Halsted.   PRIMARY CARE PHYSICIAN: Dr. Burnett Sheng.   PRESENTING COMPLAINT: Pain between her breasts and epigastric pain with nausea, black, tarry stools with progression to brown vomitus.   HISTORY OF PRESENT ILLNESS: [name] is a pleasant 81 year old woman with history of chronic pain, fibromyalgia, anemia, asthma, arthritis, history of transient ischemic attack and prior presumed gastrointestinal bleed in 2008 thought to be related to ibuprofen use, who presents with daughter in the room. It appears approximately one week ago the patient was started on Mobic to help with pain by pain clinic. On day four she became weak with increased pallor and the following day developed nausea with black tarry stools. She also reports pain that developed between her breasts and in her epigastric area, worsening with eating or drinking and sometimes with taking deep breaths. The patient presented to [facility] on 12/28/2011. There are concerns of gastrointestinal bleed related to Mobic use as her hemoglobin dropped from baseline of 11 to 9.3. Dr. Silver Huguenin discontinued the Mobic, wrote a prescription for Phenergan and ferrous sulfate and instructed the patient to continue to monitor symptoms. She had a repeat hemoglobin on 02/18 of 9.2. However, this evening she reports worsening symptoms where instead of nausea she had dark vomitus with worsening pain, still having the black, tarry stools. The patient endorses presyncope, but no syncope. No palpitations or shortness of breath.   PAST MEDICAL HISTORY:  1. Asthma.  2. Anemia.  3. Arthritis.  4. Fibromyalgia.  5. Hemorrhoids.  6. Pneumonia in 2000.  7. Gastroesophageal reflux disease.  8. Transient ischemic attack in 1997.  9. Glaucoma.  10. Presumed gastrointestinal bleed in 2008, thought to be related to ibuprofen.   11. Chronic pain followed at the pain clinic.  12. Hyperlipidemia.   PAST SURGICAL HISTORY:  1. Appendectomy.  2. Bilateral tubal ligation.  3. Hysterectomy.  4. Bilateral cataract surgery.  5. Lumbar decompression.   ALLERGIES: No known drug allergies.   MEDICATIONS:  1. Aspirin 81 mg daily.  2. Doxepin 100 mg daily.  3. Duragesic patch 12 mcg every three days.  4. Ferrous sulfate 325 mg b.i.d.  5. Phenergan as needed.  6. Mobic 15 mg daily.  7. Norco 5/325, 1 tablet 1 to 3 times daily as needed.  8. Omeprazole 40 mg daily.  9. Sertraline 100 mg b.i.d.  10. Sucralfate 1 gram q.i.d. before meals and at bedtime. This was also written by Dr. Silver Huguenin on the visit on 12/28/2011.  11. Zolpidem 10 mg at bedtime.   FAMILY HISTORY: Denies any stroke, myocardial infarction or diabetes.   SOCIAL HISTORY: She lives in [place] alone. She has been widowed since October 2012. Denies any tobacco, alcohol or drug use.  REVIEW OF SYSTEMS: CONSTITUTIONAL: No fevers. She reports developing chills today. She has had poor p.o. intake due to her gastrointestinal symptoms. EYES: History of glaucoma and cataracts. ENT: No epistaxis or discharge. She endorses pain with swallowing food and drink. RESPIRATORY: No cough, wheezing, or hemoptysis. CARDIOVASCULAR: Reports pain in between her breasts. No palpitations. She endorses presyncope, but no syncope. GASTROINTESTINAL: Nausea and vomiting as per history of present illness and epigastric abdominal pain. Reports dark vomitus and black, tarry stools. GU: No dysuria or hematuria. ENDOCRINE: No polyuria or polydipsia. HEMATOLOGIC: No easy bleeding. SKIN: No ulcers. MUSCULOSKELETAL: She has chronic back pain  and fibromyalgia. NEUROLOGIC: No one-sided weakness or numbness. History of transient ischemic attacks. PSYCH: Denies any suicidal ideation. She reports depression.   PHYSICAL EXAMINATION:  VITAL SIGNS: Temperature 98.3, pulse 94, respiratory rate 18,  blood pressure 138/66, sating at 94% on room air.   GENERAL: Lying in bed in no apparent distress.   HEENT: Normocephalic, atraumatic. Pupils equal, symmetric. Nares without discharge. Moist mucous membrane.   NECK: Soft and supple. No adenopathy or JVP.   CARDIOVASCULAR: Non-tachy. No murmurs, rubs, or gallops.   LUNGS: Clear to auscultation bilaterally. No use of accessory muscles or increased respiratory effort.   ABDOMEN: Soft. She has tenderness on palpation of the epigastric region and also palpation of chest Cipollone between her breasts.   EXTREMITIES: No edema. Dorsal pedis pulses intact.   MUSCULOSKELETAL: No joint effusion.   SKIN: No ulcers.   NEUROLOGIC: Symmetrical strength. No focal deficits.   PSYCH: She is alert and oriented. The patient is cooperative.   PERTINENT LABS AND STUDIES: WBC 9.1, hemoglobin 9.1, hematocrit 28.7, platelets 334, MCV 74, lipase 139, glucose 107, BUN 14, creatinine 0.89, sodium 144, potassium 3.5, chloride 105, carbon dioxide 25, calcium 8.5. LFTs within normal limits. INR 1, troponin less than 0.02. EKG was normal sinus rate of 88. There is T wave inversion in V1, V2, V3 and aVR. No ST changes.   ASSESSMENT AND PLAN: [name] is a 81 year old woman with history of anemia, transient ischemic attack, asthma, glaucoma, history of prior gastrointestinal bleed, chronic pain and fibromyalgia, who was recently started on Mobic, presenting with pain between her breasts and epigastric pain with p.o. intake, presyncope, black, tarry stools and nausea with progression to brown vomitus.  1. Gastrointestinal bleed, likely upper in the setting of NSAID use, [facility] and had initial hemoglobin of 9.3 with repeat on 02/18 of 9.2. Her presenting hemoglobin is 9.1. Given progression to a dark vomitus, concerning for continued bleeding. Her hemoglobin may not reflect her actual blood count. Will continue to cycle hemoglobin every six hours.  Keep the patient on a clear diet. Continue Protonix drip, Zofran as needed, IV fluids. Type and screen and transfuse if needed. Will obtain gastroenterology consultation.  2. Chronic pain as above. Continue to hold Mobic. Restart Duragesic patch, Norco as needed. Also restart her doxepin and sertraline. The patient discontinued medication since Friday when her symptoms began.  3. History of transient ischemic attacks. Hold aspirin in the setting of gastrointestinal bleed.  4. Glaucoma. Restart her eyedrops.  5. Prophylaxis with Protonix drip, TEDs and SCDs.   TIME SPENT: Approximately 45 minutes were spent on patient care.   ____________________________ Reuel DerbyAlounthith Jacques Willingham, MD ap:ap D: 12/30/2011 04:13:08 ET T: 12/30/2011 07:38:08 ET JOB#: [id number]  cc: Pearlean BrownieAlounthith Takeila Thayne, MD, <Dictator> Rhona LeavensJames F. Burnett ShengHedrick, MD Reuel DerbyALOUNTHITH Presleigh Feldstein MD ELECTRONICALLY SIGNED 01/06/2012 0:49

## 2015-03-04 NOTE — Consult Note (Signed)
Brief Consult Note: Diagnosis: GI bleed and anemia.   Patient was seen by consultant.   Consult note dictated.   Discussed with Attending MD.   Comments: Coffee ground emesis and melena most likely secondarry to PUD caused by NSAID use. No signs of active GI bleed.  Recomendations: Continue PPI infusion. Follow H and H and transfuse if hemoglobin frops any further. EGD in am. Procedure discussed withe her in detail. She is in full agreement. Will follow.  Electronic Signatures: Lurline DelIftikhar, Ethelwyn Gilbertson (MD)  (Signed 19-Feb-13 11:54)  Authored: Brief Consult Note   Last Updated: 19-Feb-13 11:54 by Lurline DelIftikhar, Brody Bonneau (MD)

## 2015-04-16 ENCOUNTER — Ambulatory Visit: Payer: Commercial Managed Care - HMO | Admitting: Anesthesiology

## 2015-04-16 ENCOUNTER — Encounter
Admission: RE | Disposition: A | Discharge status: Home / Self Care | Payer: Self-pay | Source: Ambulatory Visit | Attending: Gastroenterology

## 2015-04-16 ENCOUNTER — Ambulatory Visit
Admission: RE | Admit: 2015-04-16 | Discharge: 2015-04-16 | Disposition: A | Discharge status: Home / Self Care | Payer: Commercial Managed Care - HMO | Source: Ambulatory Visit | Attending: Gastroenterology | Admitting: Gastroenterology

## 2015-04-16 ENCOUNTER — Encounter: Payer: Self-pay | Admitting: Anesthesiology

## 2015-04-16 DIAGNOSIS — M1990 Unspecified osteoarthritis, unspecified site: Secondary | ICD-10-CM | POA: Diagnosis not present

## 2015-04-16 DIAGNOSIS — Z86010 Personal history of colonic polyps: Secondary | ICD-10-CM | POA: Insufficient documentation

## 2015-04-16 DIAGNOSIS — M797 Fibromyalgia: Secondary | ICD-10-CM | POA: Insufficient documentation

## 2015-04-16 DIAGNOSIS — E785 Hyperlipidemia, unspecified: Secondary | ICD-10-CM | POA: Insufficient documentation

## 2015-04-16 DIAGNOSIS — G4700 Insomnia, unspecified: Secondary | ICD-10-CM | POA: Diagnosis not present

## 2015-04-16 DIAGNOSIS — R1310 Dysphagia, unspecified: Secondary | ICD-10-CM | POA: Insufficient documentation

## 2015-04-16 DIAGNOSIS — Z79899 Other long term (current) drug therapy: Secondary | ICD-10-CM | POA: Diagnosis not present

## 2015-04-16 DIAGNOSIS — K5900 Constipation, unspecified: Secondary | ICD-10-CM | POA: Diagnosis not present

## 2015-04-16 DIAGNOSIS — K219 Gastro-esophageal reflux disease without esophagitis: Secondary | ICD-10-CM | POA: Diagnosis present

## 2015-04-16 DIAGNOSIS — K2270 Barrett's esophagus without dysplasia: Secondary | ICD-10-CM | POA: Insufficient documentation

## 2015-04-16 DIAGNOSIS — H409 Unspecified glaucoma: Secondary | ICD-10-CM | POA: Diagnosis not present

## 2015-04-16 DIAGNOSIS — I2510 Atherosclerotic heart disease of native coronary artery without angina pectoris: Secondary | ICD-10-CM | POA: Insufficient documentation

## 2015-04-16 DIAGNOSIS — F17200 Nicotine dependence, unspecified, uncomplicated: Secondary | ICD-10-CM | POA: Diagnosis not present

## 2015-04-16 DIAGNOSIS — Z8673 Personal history of transient ischemic attack (TIA), and cerebral infarction without residual deficits: Secondary | ICD-10-CM | POA: Diagnosis not present

## 2015-04-16 DIAGNOSIS — H209 Unspecified iridocyclitis: Secondary | ICD-10-CM | POA: Insufficient documentation

## 2015-04-16 DIAGNOSIS — Z7901 Long term (current) use of anticoagulants: Secondary | ICD-10-CM | POA: Diagnosis not present

## 2015-04-16 DIAGNOSIS — K5790 Diverticulosis of intestine, part unspecified, without perforation or abscess without bleeding: Secondary | ICD-10-CM | POA: Insufficient documentation

## 2015-04-16 DIAGNOSIS — E669 Obesity, unspecified: Secondary | ICD-10-CM | POA: Insufficient documentation

## 2015-04-16 DIAGNOSIS — J45909 Unspecified asthma, uncomplicated: Secondary | ICD-10-CM | POA: Insufficient documentation

## 2015-04-16 DIAGNOSIS — Z6832 Body mass index (BMI) 32.0-32.9, adult: Secondary | ICD-10-CM | POA: Insufficient documentation

## 2015-04-16 HISTORY — PX: ESOPHAGOGASTRODUODENOSCOPY: SHX5428

## 2015-04-16 SURGERY — EGD (ESOPHAGOGASTRODUODENOSCOPY)
Anesthesia: General

## 2015-04-16 MED ORDER — PROPOFOL INFUSION 10 MG/ML OPTIME
INTRAVENOUS | Status: DC | PRN
  Administered 2015-04-16: 50 ug/kg/min via INTRAVENOUS

## 2015-04-16 MED ORDER — PROPOFOL 10 MG/ML IV BOLUS
INTRAVENOUS | Status: DC | PRN
  Administered 2015-04-16: 50 mg via INTRAVENOUS

## 2015-04-16 MED ORDER — GLYCOPYRROLATE 0.2 MG/ML IJ SOLN
INTRAMUSCULAR | Status: DC | PRN
  Administered 2015-04-16: 0.2 mg via INTRAVENOUS

## 2015-04-16 MED ORDER — SODIUM CHLORIDE 0.9 % IV SOLN
INTRAVENOUS | Status: DC
  Administered 2015-04-16 (×2): via INTRAVENOUS

## 2015-04-16 NOTE — H&P (Signed)
  Date of Initial H&P:04/13/2015  History reviewed, patient examined, no change in status, stable for surgery. 

## 2015-04-16 NOTE — Anesthesia Postprocedure Evaluation (Signed)
  Anesthesia Post-op Note  Patient: Cynthia Bennett  Procedure(s) Performed: Procedure(s): ESOPHAGOGASTRODUODENOSCOPY (EGD) (N/A)  Anesthesia type:General  Patient location: PACU  Post pain: Pain level controlled  Post assessment: Post-op Vital signs reviewed, Patient's Cardiovascular Status Stable, Respiratory Function Stable, Patent Airway and No signs of Nausea or vomiting  Post vital signs: Reviewed and stable  Last Vitals:  Filed Vitals:   04/16/15 1223  BP: 112/67  Pulse:   Temp:   Resp:     Level of consciousness: awake, alert  and patient cooperative  Complications: No apparent anesthesia complications

## 2015-04-16 NOTE — Anesthesia Preprocedure Evaluation (Addendum)
Anesthesia Evaluation  Patient identified by MRN, date of birth, ID band Patient awake    Reviewed: Allergy & Precautions, NPO status , Patient's Chart, lab work & pertinent test results  Airway Mallampati: III  TM Distance: <3 FB Neck ROM: Full    Dental  (+) Partial Lower, Caps   Pulmonary neg pulmonary ROS,  breath sounds clear to auscultation  Pulmonary exam normal       Cardiovascular negative cardio ROS Normal cardiovascular examRhythm:Regular Rate:Normal     Neuro/Psych negative neurological ROS  negative psych ROS   GI/Hepatic Neg liver ROS, CA of tonsil   Endo/Other  negative endocrine ROS  Renal/GU negative Renal ROS  negative genitourinary   Musculoskeletal negative musculoskeletal ROS (+)   Abdominal Normal abdominal exam  (+)   Peds negative pediatric ROS (+)  Hematology negative hematology ROS (+)   Anesthesia Other Findings   Reproductive/Obstetrics                             Anesthesia Physical Anesthesia Plan  ASA: II  Anesthesia Plan: General   Post-op Pain Management:    Induction: Intravenous  Airway Management Planned: Nasal Cannula  Additional Equipment:   Intra-op Plan:   Post-operative Plan:   Informed Consent: I have reviewed the patients History and Physical, chart, labs and discussed the procedure including the risks, benefits and alternatives for the proposed anesthesia with the patient or authorized representative who has indicated his/her understanding and acceptance.   Dental advisory given  Plan Discussed with:   Anesthesia Plan Comments:         Anesthesia Quick Evaluation

## 2015-04-16 NOTE — Transfer of Care (Signed)
Immediate Anesthesia Transfer of Care Note  Patient: Cynthia Bennett  Procedure(s) Performed: Procedure(s): ESOPHAGOGASTRODUODENOSCOPY (EGD) (N/A)  Patient Location: PACU/ENDO  Anesthesia Type:General  Level of Consciousness: Alert, Awake, Oriented  Airway & Oxygen Therapy: Patient Spontanous Breathing  Post-op Assessment: Report given to RN  Post vital signs: Reviewed and stable  Last Vitals:  Filed Vitals:   04/16/15 1201  BP: 99/50  Pulse: 68  Temp: 36.2 C  Resp: 16    Complications: No apparent anesthesia complications

## 2015-04-16 NOTE — Op Note (Signed)
[facility] Gastroenterology Patient Name: Cynthia Bennett Procedure Date: 04/16/2015 11:44 AM MRN: [id number] Account #: [id number] Date of Birth: May 02, 1933 Admit Type: Outpatient Age: 81 Room: [facility] ENDO ROOM 4 Gender: Female Note Status: Finalized Procedure:         Upper GI endoscopy Indications:       Dysphagia, Suspected esophageal reflux, Follow-up of                     Barrett's esophagus Providers:         Ezzard Standing. Bluford Kaufmann, MD Referring MD:      Rhona Leavens. Burnett Sheng, MD (Referring MD) Medicines:         Monitored Anesthesia Care Complications:     No immediate complications. Procedure:         Pre-Anesthesia Assessment:                    - Prior to the procedure, a History and Physical was                     performed, and patient medications, allergies and                     sensitivities were reviewed. The patient's tolerance of                     previous anesthesia was reviewed.                    - The risks and benefits of the procedure and the sedation                     options and risks were discussed with the patient. All                     questions were answered and informed consent was obtained.                    - After reviewing the risks and benefits, the patient was                     deemed in satisfactory condition to undergo the procedure.                    After obtaining informed consent, the endoscope was passed                     under direct vision. Throughout the procedure, the                     patient's blood pressure, pulse, and oxygen saturations                     were monitored continuously. The Olympus GIF-160 endoscope                     (S#. [id number]) was introduced through the mouth, and                     advanced to the second part of duodenum. The upper GI                     endoscopy was accomplished without difficulty. The patient  tolerated the procedure well. Findings:      There were  esophageal mucosal changes consistent with short-segment       Barrett's esophagus present in the lower third of the esophagus. The       maximum longitudinal extent of these mucosal changes was 2 cm in length.       Mucosa was biopsied with a cold forceps for histology. One specimen       bottle was sent to pathology. Mucosa was biopsied with a cold forceps       for histology in 4 quadrants in the lower third of the esophagus. One       specimen bottle was sent to pathology. The scope was withdrawn. Dilation       was performed with a Maloney dilator with mild resistance at 54 Fr.      The exam was otherwise without abnormality.      The entire examined stomach was normal.      The examined duodenum was normal. Impression:        - Esophageal mucosal changes consistent with short-segment                     Barrett's esophagus. Biopsied. Dilated.                    - The examination was otherwise normal.                    - Normal stomach.                    - Normal examined duodenum. Recommendation:    - Discharge patient to home.                    - Observe patient's clinical course.                    - Await pathology results.                    - Continue present medications.                    - The findings and recommendations were discussed with the                     patient. Procedure Code(s): --- Professional ---                    [phone], Esophagogastroduodenoscopy, flexible, transoral;                     with biopsy, single or multiple                    43450, Dilation of esophagus, by unguided sound or bougie,                     single or multiple passes Diagnosis Code(s): --- Professional ---                    K22.70, Barrett's esophagus without dysplasia                    R13.10, Dysphagia, unspecified CPT copyright 2014 American Medical Association. All rights reserved. The codes documented in this report are preliminary and upon coder review may  be revised  to meet current compliance requirements. Renae Fickle  Guadelupe SabinY Iyana Topor, MD 04/16/2015 11:59:10 AM This report has been signed electronically. Number of Addenda: 0 Note Initiated On: 04/16/2015 11:44 AM      [facility]

## 2015-04-17 LAB — SURGICAL PATHOLOGY

## 2015-04-19 ENCOUNTER — Encounter: Payer: Self-pay | Admitting: Gastroenterology

## 2015-05-07 ENCOUNTER — Other Ambulatory Visit: Payer: Self-pay | Admitting: Family Medicine

## 2015-05-07 DIAGNOSIS — Z1231 Encounter for screening mammogram for malignant neoplasm of breast: Secondary | ICD-10-CM

## 2015-05-17 ENCOUNTER — Ambulatory Visit
Admission: RE | Admit: 2015-05-17 | Discharge: 2015-05-17 | Disposition: A | Discharge status: Home / Self Care | Payer: Commercial Managed Care - HMO | Source: Ambulatory Visit | Attending: Family Medicine | Admitting: Family Medicine

## 2015-05-17 ENCOUNTER — Other Ambulatory Visit: Payer: Self-pay | Admitting: Family Medicine

## 2015-05-17 DIAGNOSIS — Z1231 Encounter for screening mammogram for malignant neoplasm of breast: Secondary | ICD-10-CM

## 2015-08-14 ENCOUNTER — Encounter: Payer: Self-pay | Admitting: Pain Medicine

## 2015-08-14 ENCOUNTER — Ambulatory Visit: Payer: Commercial Managed Care - HMO | Attending: Pain Medicine | Admitting: Pain Medicine

## 2015-08-14 VITALS — BP 132/62 | HR 73 | Temp 98.5°F | Resp 26 | Ht 63.0 in | Wt 180.0 lb

## 2015-08-14 DIAGNOSIS — M797 Fibromyalgia: Secondary | ICD-10-CM | POA: Insufficient documentation

## 2015-08-14 DIAGNOSIS — M4726 Other spondylosis with radiculopathy, lumbar region: Secondary | ICD-10-CM

## 2015-08-14 DIAGNOSIS — F17200 Nicotine dependence, unspecified, uncomplicated: Secondary | ICD-10-CM | POA: Diagnosis not present

## 2015-08-14 DIAGNOSIS — E669 Obesity, unspecified: Secondary | ICD-10-CM | POA: Insufficient documentation

## 2015-08-14 DIAGNOSIS — M5442 Lumbago with sciatica, left side: Secondary | ICD-10-CM | POA: Diagnosis not present

## 2015-08-14 DIAGNOSIS — G894 Chronic pain syndrome: Secondary | ICD-10-CM

## 2015-08-14 DIAGNOSIS — M549 Dorsalgia, unspecified: Secondary | ICD-10-CM | POA: Diagnosis present

## 2015-08-14 DIAGNOSIS — M4727 Other spondylosis with radiculopathy, lumbosacral region: Secondary | ICD-10-CM | POA: Diagnosis not present

## 2015-08-14 DIAGNOSIS — Z720 Tobacco use: Secondary | ICD-10-CM | POA: Insufficient documentation

## 2015-08-14 HISTORY — DX: Other spondylosis with radiculopathy, lumbar region: M47.26

## 2015-08-14 MED ORDER — SODIUM CHLORIDE 0.9 % IJ SOLN
INTRAMUSCULAR | Status: AC
  Administered 2015-08-14: 14:00:00
  Filled 2015-08-14: qty 10

## 2015-08-14 MED ORDER — IOHEXOL 180 MG/ML  SOLN
INTRAMUSCULAR | Status: AC
Start: 2015-08-14 — End: 2015-08-14
  Administered 2015-08-14: 14:00:00
  Filled 2015-08-14: qty 20

## 2015-08-14 MED ORDER — TRIAMCINOLONE ACETONIDE 40 MG/ML IJ SUSP
INTRAMUSCULAR | Status: AC
  Administered 2015-08-14: 14:00:00
  Filled 2015-08-14: qty 1

## 2015-08-14 MED ORDER — ROPIVACAINE HCL 2 MG/ML IJ SOLN
INTRAMUSCULAR | Status: AC
  Administered 2015-08-14: 14:00:00
  Filled 2015-08-14: qty 10

## 2015-08-14 MED ORDER — LIDOCAINE HCL (PF) 1 % IJ SOLN
INTRAMUSCULAR | Status: AC
  Administered 2015-08-14: 14:00:00
  Filled 2015-08-14: qty 5

## 2015-08-14 NOTE — Patient Instructions (Signed)

## 2015-08-14 NOTE — Progress Notes (Signed)
Safety precautions to be maintained throughout the outpatient stay will include: orient to surroundings, keep bed in low position, maintain call bell within reach at all times, provide assistance with transfer out of bed and ambulation.  

## 2015-08-15 ENCOUNTER — Telehealth: Payer: Self-pay | Admitting: *Deleted

## 2015-08-15 NOTE — Telephone Encounter (Signed)
Patient verbalizes no complications from procedure.  

## 2015-08-15 NOTE — Telephone Encounter (Signed)
Patient states she is doing well after procedure, no complications or concerns.

## 2015-08-19 DIAGNOSIS — G894 Chronic pain syndrome: Secondary | ICD-10-CM | POA: Insufficient documentation

## 2015-08-19 NOTE — Progress Notes (Signed)
Patient's Name: Cynthia Bennett MRN: [id number] DOB: July 01, 1933 DOS: 08/14/2015  Primary Reason(s) for Visit: Interventional Pain Management Treatment. CC: Back Pain   Pre-Procedure Assessment: Ms. Mathes is a 81 y.o. year old, female patient, seen today for interventional treatment. She has Fibromyalgia; Adiposity; Current tobacco use; Bilateral low back pain with left-sided sciatica; Osteoarthritis of spine with radiculopathy, lumbar region; and Chronic pain syndrome on her problem list.. Her primarily concern today is the Back Pain Verification of the correct person, correct site (including marking of site), and correct procedure were performed and confirmed by the patient. Today's Vitals   08/14/15 1359 08/14/15 1404 08/14/15 1409 08/14/15 1413  BP: 138/65 137/66 140/67 132/62  Pulse: 87 74 75 73  Temp:      TempSrc:      Resp: _0 Height:      Weight:      SpO2: 98% 97% 94% 95%  Calculated BMI: Body mass index is 31.89 kg/(m^2). Allergies: She is allergic to nsaids.. Primary Diagnosis: Bilateral low back pain with left-sided sciatica [M54.42]  Procedure: Type: Palliative Inter-Laminar Epidural Steroid Injection Region: Lumbar Level: L4-5 Level. Laterality: Left Paramedial  Indications: Spondylosis, Lumbosacral Region  Consent: Secured. Under the influence of no sedatives a written informed consent was obtained, after having provided information on the risks and possible complications. To fulfill our ethical and legal obligations, as recommended by the American Medical Association's Code of Ethics, we have provided information to the patient about our clinical impression; the nature and purpose of the treatment or procedure; the risks, benefits, and possible complications of the intervention; alternatives; the risk(s) and benefit(s) of the alternative treatment(s) or procedure(s); and the risk(s) and benefit(s) of doing nothing. The patient was provided information about the  risks and possible complications associated with the procedure. In the case of spinal procedures these may include, but are not limited to, failure to achieve desired goals, infection, bleeding, organ or nerve damage, allergic reactions, paralysis, and death. In addition, the patient was informed that Medicine is not an exact science; therefore, there is also the possibility of unforeseen risks and possible complications that may result in a catastrophic outcome. The patient indicated having understood very clearly. We have given the patient no guarantees and we have made no promises. Enough time was given to the patient to ask questions, all of which were answered to the patient's satisfaction.  Pre-Procedure Preparation: Safety Precautions: Allergies reviewed. Appropriate site, procedure, and patient were confirmed by following the Joint Commission's Universal Protocol (UP.01.01.01), in the form of a "Time Out". The patient was asked to confirm marked site and procedure, before commencing. The patient was asked about blood thinners, or active infections, both of which were denied. Patient was assessed for positional comfort and all pressure points were checked before starting procedure. Monitoring:  As per clinic protocol. Infection Control Precautions: Sterile technique used. Standard Universal Precautions were taken as recommended by the Department of [facility] for Disease Control and Prevention (CDC). Standard pre-surgical skin prep was conducted. Respiratory hygiene and cough etiquette was practiced. Hand hygiene observed. Safe injection practices and needle disposal techniques followed. SDV (single dose vial) medications used. Medications properly checked for expiration dates and contaminants. Personal protective equipment (PPE) used: Surgical mask. Sterile double glove technique. Radiation resistant gloves. Sterile surgical gloves.  Anesthesia, Analgesia, Anxiolysis: Type: Local  Anesthesia Local Anesthetic(s): Lidocaine 1% Route: Subcutaneous IV Access: Declined. Sedation: Declined. Indication(s):Not applicable.  Description of Procedure Process:  Time-out: "Time-out"  completed before starting procedure, as per protocol. Position: Prone Target Area: For Epidural Steroid injections, the target area is the  interlaminar space, initially targeting the lower border of the superior vertebral body lamina. Approach: Posterior approach. Area Prepped: Entire Posterior Lumbosacral Region Prepping solution: ChloraPrep (2% chlorhexidine gluconate and 70% isopropyl alcohol) Safety Precautions: Aspiration looking for blood return was conducted prior to all injections. At no point did we inject any substances, as a needle was being advanced. No attempts were made at seeking any paresthesias. Safe injection practices and needle disposal techniques used. Medications properly checked for expiration dates. SDV (single dose vial) medications used. Description of the Procedure: Protocol guidelines were followed. The patient was placed in position over the fluoroscopy table. The target area was identified and the area prepped in the usual manner. Skin desensitized using vapocoolant spray. Skin & deeper tissues infiltrated with local anesthetic. Appropriate amount of time allowed to pass for local anesthetics to take effect. The procedure needle was introduced through the skin, ipsilateral to the reported pain, and advanced to the target area. Bone was contacted and the needle walked caudad, until the lamina was cleared. The epidural space was identified using "loss-of-resistance technique" with 2-3 ml of PF-NaCl (0.9% NSS), in a 5cc LOR glass syringe. Proper needle placement secured. Negative aspiration confirmed. Solution injected in intermittent fashion, asking for systemic symptoms every 0.5cc of injectate. The needles were then removed and the area cleansed, making sure to leave some of the  prepping solution back to take advantage of its long term bactericidal properties. EBL: None Materials & Medications Used:  Needle(s) Used: 20g - 10cm, Tuohy-style epidural needle Medications Administered today: We administered ropivacaine (PF) 2 mg/ml (0.2%), iohexol, lidocaine (PF), sodium chloride, and triamcinolone acetonide.Please see chart orders for dosing details.  Imaging Guidance:  Type of Imaging Technique: Fluoroscopy Guidance (Spinal) Indication(s): Assistance in needle guidance and placement for procedures requiring needle placement in or near specific anatomical locations not easily accessible without such assistance. Exposure Time: Please see nurses notes. Contrast: Before injecting any contrast, we confirmed that the patient did not have an allergy to iodine, shellfish, or radiological contrast. Once satisfactory needle placement was completed at the desired level, radiological contrast was injected. Injection was conducted under continuous fluoroscopic guidance. Injection of contrast accomplished without complications. See chart for type and volume of contrast used. Fluoroscopic Guidance: I was personally present in the fluoroscopy suite, where the patient was placed in position for the procedure, over the fluoroscopy-compatible table. Fluoroscopy was manipulated, using "Tunnel Vision Technique", to obtain the best possible view of the target area, on the affected side. Parallax error was corrected before commencing the procedure. A "direction-depth-direction" technique was used to introduce the needle under continuous pulsed fluoroscopic guidance. Once the target was reached, antero-posterior, oblique, and lateral fluoroscopic projection views were taken to confirm needle placement in all planes. Permanently recorded images stored by scanning into EMR. Interpretation: Intraoperative imaging interpretation by performing Physician. Adequate needle placement confirmed. Adequate needle  placement confirmed in AP, lateral, & Oblique Views. Appropriate spread of contrast to desired area. No evidence of afferent or efferent intravascular uptake. No intrathecal or subarachnoid spread observed.  Antibiotics:  Type:  Antibiotics Given (last 72 hours)    None      Indication(s): None reported.  Post-operative Assessment:  Complications: No immediate post-treatment complications were observed. Relevant Post-operative Information:  Disposition: Return to clinic for follow-up evaluation. The patient tolerated the entire procedure well. A repeat set of vitals were  taken after the procedure and the patient was kept under observation following institutional policy, for this procedure. Post-procedural neurological assessment was performed, showing return to baseline, prior to discharge. The patient was discharged home, once institutional criteria were met. The patient was provided with post-procedure discharge instructions, including a section on how to identify potential problems. Should any problems arise concerning this procedure, the patient was given instructions to immediately contact us, at any time, without hesitation. In any case, we plan to contact the patient by telephone for a follow-up status report regarding this interventional procedure. Comments:  No additional relevant information.  Primary Care Physician: [name], MD Location: [facility] Outpatient Pain Management Facility Note by: Kathlen Brunswick. Dossie Arbour, M.D, DABA, DABAPM, DABPM, DABIPP, FIPP  Disclaimer:  Medicine is not an exact science. The only guarantee in medicine is that nothing is guaranteed. It is important to note that the decision to proceed with this intervention was based on the information collected from the patient. The Data and conclusions were drawn from the patient's questionnaire, the interview, and the physical examination. Because the information was provided in large part by the patient, it cannot be  guaranteed that it has not been purposely or unconsciously manipulated. Every effort has been made to obtain as much relevant data as possible for this evaluation. It is important to note that the conclusions that lead to this procedure are derived in large part from the available data. Always take into account that the treatment will also be dependent on availability of resources and existing treatment guidelines, considered by other Pain Management Practitioners as being common knowledge and practice, at the time of the intervention. For Medico-Legal purposes, it is also important to point out that variation in procedural techniques and pharmacological choices are the acceptable norm. The indications, contraindications, technique, and results of the above procedure should only be interpreted and judged by a Board-Certified Interventional Pain Specialist with extensive familiarity and expertise in the same exact procedure and technique. Attempts at providing opinions without similar or greater experience and expertise than that of the treating physician will be considered as inappropriate and unethical, and shall result in a formal complaint to the state medical board and applicable specialty societies.

## 2015-08-20 ENCOUNTER — Other Ambulatory Visit: Payer: Self-pay | Admitting: Pain Medicine

## 2015-08-30 ENCOUNTER — Encounter: Payer: Self-pay | Admitting: Pain Medicine

## 2015-08-30 ENCOUNTER — Ambulatory Visit: Payer: Commercial Managed Care - HMO | Attending: Pain Medicine | Admitting: Pain Medicine

## 2015-08-30 VITALS — BP 148/75 | HR 59 | Temp 98.7°F | Resp 16 | Ht 63.0 in | Wt 175.0 lb

## 2015-08-30 DIAGNOSIS — M5416 Radiculopathy, lumbar region: Secondary | ICD-10-CM | POA: Diagnosis not present

## 2015-08-30 DIAGNOSIS — H401190 Primary open-angle glaucoma, unspecified eye, stage unspecified: Secondary | ICD-10-CM | POA: Insufficient documentation

## 2015-08-30 DIAGNOSIS — K219 Gastro-esophageal reflux disease without esophagitis: Secondary | ICD-10-CM | POA: Insufficient documentation

## 2015-08-30 DIAGNOSIS — Z79899 Other long term (current) drug therapy: Secondary | ICD-10-CM

## 2015-08-30 DIAGNOSIS — M47896 Other spondylosis, lumbar region: Secondary | ICD-10-CM | POA: Diagnosis not present

## 2015-08-30 DIAGNOSIS — G459 Transient cerebral ischemic attack, unspecified: Secondary | ICD-10-CM | POA: Insufficient documentation

## 2015-08-30 DIAGNOSIS — M797 Fibromyalgia: Secondary | ICD-10-CM | POA: Diagnosis not present

## 2015-08-30 DIAGNOSIS — Z87891 Personal history of nicotine dependence: Secondary | ICD-10-CM | POA: Diagnosis not present

## 2015-08-30 DIAGNOSIS — M4726 Other spondylosis with radiculopathy, lumbar region: Secondary | ICD-10-CM

## 2015-08-30 DIAGNOSIS — G894 Chronic pain syndrome: Secondary | ICD-10-CM

## 2015-08-30 DIAGNOSIS — F1120 Opioid dependence, uncomplicated: Secondary | ICD-10-CM

## 2015-08-30 DIAGNOSIS — G4700 Insomnia, unspecified: Secondary | ICD-10-CM | POA: Insufficient documentation

## 2015-08-30 DIAGNOSIS — N951 Menopausal and female climacteric states: Secondary | ICD-10-CM | POA: Insufficient documentation

## 2015-08-30 DIAGNOSIS — F1190 Opioid use, unspecified, uncomplicated: Secondary | ICD-10-CM | POA: Diagnosis not present

## 2015-08-30 DIAGNOSIS — E785 Hyperlipidemia, unspecified: Secondary | ICD-10-CM | POA: Insufficient documentation

## 2015-08-30 DIAGNOSIS — I6782 Cerebral ischemia: Secondary | ICD-10-CM | POA: Insufficient documentation

## 2015-08-30 DIAGNOSIS — M549 Dorsalgia, unspecified: Secondary | ICD-10-CM | POA: Insufficient documentation

## 2015-08-30 DIAGNOSIS — G939 Disorder of brain, unspecified: Secondary | ICD-10-CM | POA: Insufficient documentation

## 2015-08-30 DIAGNOSIS — Z5181 Encounter for therapeutic drug level monitoring: Secondary | ICD-10-CM

## 2015-08-30 DIAGNOSIS — Z79891 Long term (current) use of opiate analgesic: Secondary | ICD-10-CM

## 2015-08-30 MED ORDER — HYDROCODONE-ACETAMINOPHEN 5-325 MG PO TABS: 1.0000 | ORAL_TABLET | Freq: Four times a day (QID) | ORAL | Status: DC | PRN

## 2015-08-30 NOTE — Progress Notes (Signed)
Patient's Name: Cynthia Bennett MRN: [id number] DOB: [birth year] DOS: 08/30/2015  Primary Reason(s) for Visit: Encounter for Medication Management. CC: Back Pain   HPI:   Cynthia Bennett is a 81 y.o. year old, female patient, who returns today as an established patient. She has Fibromyalgia; Adiposity; Current tobacco use; Bilateral low back pain with left-sided sciatica; Osteoarthritis of spine with radiculopathy, lumbar region; Chronic pain syndrome; Lumbar radicular pain; Encounter for therapeutic drug level monitoring; Long term current use of opiate analgesic; Long term prescription opiate use; Uncomplicated opioid dependence (HCC); Opiate use; Acid reflux; HLD (hyperlipidemia); Cannot sleep; Primary open angle glaucoma; Post menopausal syndrome; and Temporary cerebral vascular dysfunction on her problem list.. Her primarily concern today is the Back Pain   the patient returns today indicating that the left leg pain is completely gone. However, it is down the right leg that is giving her problems. The pain is going all the way down to her big toe following the L5 dermatomal distribution. In view of this, we will go I'll schedule her to come back as soon as possible for a right-sided L5-S1 lumbar epidural steroid injection under fluoroscopic guidance without sedation. With the rest of her medications, I have provided her with enough refills to last until 11/28/15.  Pharmacotherapy Review: Side-effects or Adverse reactions: None reported. Effectiveness: Described as relatively effective, allowing for increase in activities of daily living (ADL). Onset of action: Within expected pharmacological parameters. Duration of action: Within normal limits for medication. Peak effect: Timing and results are as within normal expected parameters. [place] PMP: Compliant with practice rules and regulations. DST: Compliant with practice rules and regulations. Lab work: No new labs ordered by our practice. Treatment  compliance: Compliant. Substance Use Disorder (SUD) Risk Level: Low Planned course of action: Continue therapy as is.  Post-Procedure Assessment: Side-effects or Adverse reactions: No significant issues reported. Sedation: No sedation used during procedure. Ultra-Short Term Relief (Initial 30-60 minutes after procedure):  100% Short-Term Relief (Initial 6 hours after procedure):  100% Long-Term Relief (Initial 2 weeks after procedure):  100% Current Relief (Now):  100% Interpretation of Results: Non-physiological response to therapy. Ultra-short term relief is a normal physiological response to analgesics and anesthetics provided during the procedure. Short-term relief confirms injected site as etiology of pain. Long term relief is possibly due to sympathetic blockade, or the effects of steroids, if administered during procedure. Persistent relief would suggest effective anti-inflammatory effects from steroids. No short or long term benefit would suggest etiology of pain to be in a different location. No long term benefit would suggest etiology to be non-inflammatory, possibly compressive.  Allergies: Cynthia Bennett is allergic to nsaids.  Meds: The patient has a current medication list which includes the following prescription(s): bupropion, dorzolamide-timolol, doxepin, hydrocodone-acetaminophen, multivitamin with minerals, omeprazole, sertraline, zolpidem, hydrocodone-acetaminophen, and hydrocodone-acetaminophen. Requested Prescriptions   Signed Prescriptions Disp Refills  . HYDROcodone-acetaminophen (NORCO/VICODIN) 5-325 MG tablet 120 tablet 0    Sig: Take 1 tablet by mouth every 6 (six) hours as needed for moderate pain.  Marland Kitchen. HYDROcodone-acetaminophen (NORCO/VICODIN) 5-325 MG tablet 120 tablet 0    Sig: Take 1 tablet by mouth every 6 (six) hours as needed for moderate pain.  Marland Kitchen. HYDROcodone-acetaminophen (NORCO/VICODIN) 5-325 MG tablet 120 tablet 0    Sig: Take 1 tablet by mouth every 6 (six)  hours as needed for moderate pain.    ROS: Constitutional: Afebrile, no chills, well hydrated and well nourished Gastrointestinal: negative Musculoskeletal:negative Neurological: negative Behavioral/Psych: negative  PFSH: Medical:  [name]  has a past medical history of TIA (transient ischemic attack); Spinal stenosis; Osteoarthritis; Fibromyalgia; Glaucoma; Peptic ulcer disease; Stroke (HCC); Reflux; and Hiatal hernia. Family: family history includes COPD in her father; Mental illness in her mother. Surgical:  has past surgical history that includes Esophagogastroduodenoscopy (N/A, 04/16/2015); Appendectomy; and Abdominal hysterectomy. Tobacco:  reports that she has quit smoking. She does not have any smokeless tobacco history on file. Alcohol:  reports that she does not drink alcohol. Drug:  reports that she does not use illicit drugs.  Physical Exam: Vitals:  Today's Vitals   08/30/15 1119  BP: 148/75  Pulse: 59  Temp: 98.7 F (37.1 C)  Resp: 16  Height:  (1.6 m)  Weight: 175 lb (79.379 kg)  SpO2: 95%  PainSc: 8   PainLoc: Back  Calculated BMI: Body mass index is 31.01 kg/(m^2). General appearance: alert, cooperative, appears stated age, mild distress and morbidly obese Eyes: conjunctivae/corneas clear. PERRL, EOM's intact. Fundi benign. Lungs: No evidence respiratory distress, no audible rales or ronchi and no use of accessory muscles of respiration Neck: no adenopathy, no carotid bruit, no JVD, supple, symmetrical, trachea midline and thyroid not enlarged, symmetric, no tenderness/mass/nodules Back: symmetric, no curvature. ROM normal. No CVA tenderness. Extremities: extremities normal, atraumatic, no cyanosis or edema Pulses: 2+ and symmetric Skin: Skin color, texture, turgor normal. No rashes or lesions Neurologic: Gait: Antalgic    Assessment: Encounter Diagnosis:  Primary Diagnosis: Chronic pain syndrome [G89.4]  Plan: Cynthia Bennett was seen today for back  pain.  Diagnoses and all orders for this visit:  Chronic pain syndrome -     HYDROcodone-acetaminophen (NORCO/VICODIN) 5-325 MG tablet; Take 1 tablet by mouth every 6 (six) hours as needed for moderate pain. -     HYDROcodone-acetaminophen (NORCO/VICODIN) 5-325 MG tablet; Take 1 tablet by mouth every 6 (six) hours as needed for moderate pain. -     HYDROcodone-acetaminophen (NORCO/VICODIN) 5-325 MG tablet; Take 1 tablet by mouth every 6 (six) hours as needed for moderate pain.  Osteoarthritis of spine with radiculopathy, lumbar region  Lumbar radicular pain Comments: Bilateral Orders: -     LUMBAR EPIDURAL STEROID INJECTION; Future  Opiate use  Uncomplicated opioid dependence (HCC)  Long term prescription opiate use  Long term current use of opiate analgesic -     Drugs of abuse screen w/o alc, rtn urine-sln; Future  Encounter for therapeutic drug level monitoring     Patient Instructions  Epidural Steroid Injection Patient Information  Description: The epidural space surrounds the nerves as they exit the spinal cord.  In some patients, the nerves can be compressed and inflamed by a bulging disc or a tight spinal canal (spinal stenosis).  By injecting steroids into the epidural space, we can bring irritated nerves into direct contact with a potentially helpful medication.  These steroids act directly on the irritated nerves and can reduce swelling and inflammation which often leads to decreased pain.  Epidural steroids may be injected anywhere along the spine and from the neck to the low back depending upon the location of your pain.   After numbing the skin with local anesthetic (like Novocaine), a small needle is passed into the epidural space slowly.  You may experience a sensation of pressure while this is being done.  The entire block usually last less than 10 minutes.  Conditions which may be treated by epidural steroids:   Low back and leg pain  Neck and arm  pain  Spinal stenosis  Post-laminectomy  syndrome  Herpes zoster (shingles) pain  Pain from compression fractures  Preparation for the injection:  1. Do not eat any solid food or dairy products within 6 hours of your appointment.  2. You may drink clear liquids up to 2 hours before appointment.  Clear liquids include water, black coffee, juice or soda.  No milk or cream please. 3. You may take your regular medication, including pain medications, with a sip of water before your appointment  Diabetics should hold regular insulin (if taken separately) and take 1/2 normal NPH dos the morning of the procedure.  Carry some sugar containing items with you to your appointment. 4. A driver must accompany you and be prepared to drive you home after your procedure.  5. Bring all your current medications with your. 6. An IV may be inserted and sedation may be given at the discretion of the physician.   7. A blood pressure cuff, EKG and other monitors will often be applied during the procedure.  Some patients may need to have extra oxygen administered for a short period. 8. You will be asked to provide medical information, including your allergies, prior to the procedure.  We must know immediately if you are taking blood thinners (like Coumadin/Warfarin)  Or if you are allergic to IV iodine contrast (dye). We must know if you could possible be pregnant.  Possible side-effects:  Bleeding from needle site  Infection (rare, may require surgery)  Nerve injury (rare)  Numbness & tingling (temporary)  Difficulty urinating (rare, temporary)  Spinal headache ( a headache worse with upright posture)  Light -headedness (temporary)  Pain at injection site (several days)  Decreased blood pressure (temporary)  Weakness in arm/leg (temporary)  Pressure sensation in back/neck (temporary)  Call if you experience:  Fever/chills associated with headache or increased back/neck pain.  Headache  worsened by an upright position.  New onset weakness or numbness of an extremity below the injection site  Hives or difficulty breathing (go to the emergency room)  Inflammation or drainage at the infection site  Severe back/neck pain  Any new symptoms which are concerning to you  Please note:  Although the local anesthetic injected can often make your back or neck feel good for several hours after the injection, the pain will likely return.  It takes 3-7 days for steroids to work in the epidural space.  You may not notice any pain relief for at least that one week.  If effective, we will often do a series of three injections spaced 3-6 weeks apart to maximally decrease your pain.  After the initial series, we generally will wait several months before considering a repeat injection of the same type.  If you have any questions, please call [phone] [facility]   Medications discontinued today:  Medications Discontinued During This Encounter  Medication Reason  . HYDROcodone-acetaminophen (NORCO/VICODIN) 5-325 MG per tablet Reorder   Medications administered today:  Cynthia Bennett had no medications administered during this visit.  Primary Care Physician: Jerl Mina, MD Location: [facility] Out[facility] Management Facility Note by: Sydnee Levans. Laban Emperor, M.D, DABA, DABAPM, DABPM, DABIPP, FIPP

## 2015-08-30 NOTE — Patient Instructions (Signed)
Epidural Steroid Injection Patient Information  Description: The epidural space surrounds the nerves as they exit the spinal cord.  In some patients, the nerves can be compressed and inflamed by a bulging disc or a tight spinal canal (spinal stenosis).  By injecting steroids into the epidural space, we can bring irritated nerves into direct contact with a potentially helpful medication.  These steroids act directly on the irritated nerves and can reduce swelling and inflammation which often leads to decreased pain.  Epidural steroids may be injected anywhere along the spine and from the neck to the low back depending upon the location of your pain.   After numbing the skin with local anesthetic (like Novocaine), a small needle is passed into the epidural space slowly.  You may experience a sensation of pressure while this is being done.  The entire block usually last less than 10 minutes.  Conditions which may be treated by epidural steroids:   Low back and leg pain  Neck and arm pain  Spinal stenosis  Post-laminectomy syndrome  Herpes zoster (shingles) pain  Pain from compression fractures  Preparation for the injection:  1. Do not eat any solid food or dairy products within 6 hours of your appointment.  2. You may drink clear liquids up to 2 hours before appointment.  Clear liquids include water, black coffee, juice or soda.  No milk or cream please. 3. You may take your regular medication, including pain medications, with a sip of water before your appointment  Diabetics should hold regular insulin (if taken separately) and take 1/2 normal NPH dos the morning of the procedure.  Carry some sugar containing items with you to your appointment. 4. A driver must accompany you and be prepared to drive you home after your procedure.  5. Bring all your current medications with your. 6. An IV may be inserted and sedation may be given at the discretion of the physician.   7. A blood pressure  cuff, EKG and other monitors will often be applied during the procedure.  Some patients may need to have extra oxygen administered for a short period. 8. You will be asked to provide medical information, including your allergies, prior to the procedure.  We must know immediately if you are taking blood thinners (like Coumadin/Warfarin)  Or if you are allergic to IV iodine contrast (dye). We must know if you could possible be pregnant.  Possible side-effects:  Bleeding from needle site  Infection (rare, may require surgery)  Nerve injury (rare)  Numbness & tingling (temporary)  Difficulty urinating (rare, temporary)  Spinal headache ( a headache worse with upright posture)  Light -headedness (temporary)  Pain at injection site (several days)  Decreased blood pressure (temporary)  Weakness in arm/leg (temporary)  Pressure sensation in back/neck (temporary)  Call if you experience:  Fever/chills associated with headache or increased back/neck pain.  Headache worsened by an upright position.  New onset weakness or numbness of an extremity below the injection site  Hives or difficulty breathing (go to the emergency room)  Inflammation or drainage at the infection site  Severe back/neck pain  Any new symptoms which are concerning to you  Please note:  Although the local anesthetic injected can often make your back or neck feel good for several hours after the injection, the pain will likely return.  It takes 3-7 days for steroids to work in the epidural space.  You may not notice any pain relief for at least that one week.    If effective, we will often do a series of three injections spaced 3-6 weeks apart to maximally decrease your pain.  After the initial series, we generally will wait several months before considering a repeat injection of the same type.  If you have any questions, please call [phone] [facility] 

## 2015-09-04 DIAGNOSIS — M4316 Spondylolisthesis, lumbar region: Secondary | ICD-10-CM | POA: Insufficient documentation

## 2015-09-04 DIAGNOSIS — K279 Peptic ulcer, site unspecified, unspecified as acute or chronic, without hemorrhage or perforation: Secondary | ICD-10-CM | POA: Insufficient documentation

## 2015-09-04 DIAGNOSIS — M25552 Pain in left hip: Secondary | ICD-10-CM

## 2015-09-04 DIAGNOSIS — M5126 Other intervertebral disc displacement, lumbar region: Secondary | ICD-10-CM | POA: Insufficient documentation

## 2015-09-04 DIAGNOSIS — K219 Gastro-esophageal reflux disease without esophagitis: Secondary | ICD-10-CM | POA: Insufficient documentation

## 2015-09-04 DIAGNOSIS — M533 Sacrococcygeal disorders, not elsewhere classified: Secondary | ICD-10-CM

## 2015-09-04 DIAGNOSIS — G8929 Other chronic pain: Secondary | ICD-10-CM | POA: Insufficient documentation

## 2015-09-04 DIAGNOSIS — M47816 Spondylosis without myelopathy or radiculopathy, lumbar region: Secondary | ICD-10-CM | POA: Insufficient documentation

## 2015-09-04 DIAGNOSIS — M545 Low back pain: Secondary | ICD-10-CM

## 2015-09-27 ENCOUNTER — Encounter: Payer: Self-pay | Admitting: Pain Medicine

## 2015-09-27 ENCOUNTER — Other Ambulatory Visit: Payer: Self-pay | Admitting: Pain Medicine

## 2015-09-27 ENCOUNTER — Ambulatory Visit: Payer: Commercial Managed Care - HMO | Attending: Pain Medicine | Admitting: Pain Medicine

## 2015-09-27 VITALS — BP 126/58 | HR 80 | Temp 98.0°F | Resp 16 | Ht 63.0 in | Wt 175.0 lb

## 2015-09-27 DIAGNOSIS — M48061 Spinal stenosis, lumbar region without neurogenic claudication: Secondary | ICD-10-CM

## 2015-09-27 DIAGNOSIS — M797 Fibromyalgia: Secondary | ICD-10-CM | POA: Insufficient documentation

## 2015-09-27 DIAGNOSIS — M4806 Spinal stenosis, lumbar region: Secondary | ICD-10-CM

## 2015-09-27 DIAGNOSIS — M47812 Spondylosis without myelopathy or radiculopathy, cervical region: Secondary | ICD-10-CM | POA: Insufficient documentation

## 2015-09-27 DIAGNOSIS — M5416 Radiculopathy, lumbar region: Secondary | ICD-10-CM

## 2015-09-27 DIAGNOSIS — M4316 Spondylolisthesis, lumbar region: Secondary | ICD-10-CM | POA: Insufficient documentation

## 2015-09-27 DIAGNOSIS — F17200 Nicotine dependence, unspecified, uncomplicated: Secondary | ICD-10-CM | POA: Diagnosis not present

## 2015-09-27 DIAGNOSIS — M47817 Spondylosis without myelopathy or radiculopathy, lumbosacral region: Secondary | ICD-10-CM | POA: Insufficient documentation

## 2015-09-27 DIAGNOSIS — E785 Hyperlipidemia, unspecified: Secondary | ICD-10-CM | POA: Insufficient documentation

## 2015-09-27 DIAGNOSIS — K219 Gastro-esophageal reflux disease without esophagitis: Secondary | ICD-10-CM | POA: Diagnosis not present

## 2015-09-27 DIAGNOSIS — M4726 Other spondylosis with radiculopathy, lumbar region: Secondary | ICD-10-CM

## 2015-09-27 DIAGNOSIS — G8929 Other chronic pain: Secondary | ICD-10-CM | POA: Insufficient documentation

## 2015-09-27 DIAGNOSIS — M47816 Spondylosis without myelopathy or radiculopathy, lumbar region: Secondary | ICD-10-CM | POA: Insufficient documentation

## 2015-09-27 DIAGNOSIS — M549 Dorsalgia, unspecified: Secondary | ICD-10-CM | POA: Diagnosis present

## 2015-09-27 DIAGNOSIS — R937 Abnormal findings on diagnostic imaging of other parts of musculoskeletal system: Secondary | ICD-10-CM

## 2015-09-27 DIAGNOSIS — Z79891 Long term (current) use of opiate analgesic: Secondary | ICD-10-CM | POA: Insufficient documentation

## 2015-09-27 MED ORDER — LIDOCAINE HCL (PF) 1 % IJ SOLN: 10.0000 mL | Freq: Once | INTRAMUSCULAR | Status: DC

## 2015-09-27 MED ORDER — SODIUM CHLORIDE 0.9 % IJ SOLN: 2.0000 mL | Freq: Once | INTRAMUSCULAR | Status: DC

## 2015-09-27 MED ORDER — LIDOCAINE HCL (PF) 1 % IJ SOLN
INTRAMUSCULAR | Status: AC
  Administered 2015-09-27: 09:00:00
  Filled 2015-09-27: qty 5

## 2015-09-27 MED ORDER — ROPIVACAINE HCL 2 MG/ML IJ SOLN: 2.0000 mL | Freq: Once | INTRAMUSCULAR | Status: DC

## 2015-09-27 MED ORDER — TRIAMCINOLONE ACETONIDE 40 MG/ML IJ SUSP: 40.0000 mg | Freq: Once | INTRAMUSCULAR | Status: DC

## 2015-09-27 MED ORDER — ROPIVACAINE HCL 2 MG/ML IJ SOLN
INTRAMUSCULAR | Status: AC
  Administered 2015-09-27: 09:00:00
  Filled 2015-09-27: qty 10

## 2015-09-27 MED ORDER — SODIUM CHLORIDE 0.9 % IJ SOLN
INTRAMUSCULAR | Status: AC
  Administered 2015-09-27: 09:00:00
  Filled 2015-09-27: qty 10

## 2015-09-27 MED ORDER — TRIAMCINOLONE ACETONIDE 40 MG/ML IJ SUSP
INTRAMUSCULAR | Status: AC
  Administered 2015-09-27: 09:00:00
  Filled 2015-09-27: qty 1

## 2015-09-27 NOTE — Progress Notes (Signed)
Patient's Name: Cynthia Bennett MRN: [id number] DOB: [birth year] DOS: 09/27/2015  Primary Reason(s) for Visit: Interventional Pain Management Treatment. CC: Back Pain   Pre-Procedure Assessment: Ms. Bochicchio is a 81 y.o. year old, female patient, seen today for interventional treatment. She has Fibromyalgia; Adiposity; Current tobacco use; Bilateral low back pain with left-sided sciatica; Osteoarthritis of spine with radiculopathy, lumbar region; Chronic pain syndrome; Lumbar radicular pain (Bilateral) (R>L); Encounter for therapeutic drug level monitoring; Long term current use of opiate analgesic; Long term prescription opiate use; Uncomplicated opioid dependence ([place]); Opiate use; Acid reflux; HLD (hyperlipidemia); Cannot sleep; Primary open angle glaucoma; Post menopausal syndrome; Temporary cerebral vascular dysfunction; Chronic low back pain; Grade 1 spondylolisthesis of L4 over L5; Severe (5-6 mm) L4-5 lumbar spinal stenosis and (8 mm) L3-4 spinal stenosis.; Lumbar spine pain; Lumbar facet arthropathy (Bilateral); Lumbar discogenic pain syndrome; Lumbar facet syndrome (Bilateral); Lumbar facet hypertrophy (Bilateral); Chronic left sacroiliac joint pain; Chronic left hip pain; Arthropathy of left hip; Peptic ulcer disease; Morbid obesity ([place]); GERD (gastroesophageal reflux disease); Chronic pain; Encounter for chronic pain management; Opiate analgesic contract exists ([facility]); Lumbar spondylosis; Abnormal MRI, lumbar spine (03/20/2000); and Cervical spondylosis (Anterolisthesis of C5 over C6) on her problem list.. Her primarily concern today is the Back Pain Verification of the correct person, correct site (including marking of site), and correct procedure were performed and confirmed by the patient. Today's Vitals   09/27/15 0837 09/27/15 0838  BP: 129/80   Pulse: 94   Temp: 98 F (36.7 C)   Resp: 16   Height: 5' 3" (1.6 m)   Weight: 175 lb (79.379 kg)   SpO2:  97%   PainSc: 10-Worst pain ever 10-Worst pain ever  Calculated BMI: Body mass index is 31.01 kg/(m^2). Allergies: She is allergic to nsaids.. Primary Diagnosis: Spinal stenosis of lumbar region [M48.06]  Procedure: Type: Palliative Inter-Laminar Epidural Steroid Injection Region: Lumbar Level: L5-S1 Level. Laterality: Right-Sided Paramedial  Indications: Spondylosis, Lumbosacral Region  Consent: Secured. Under the influence of no sedatives a written informed consent was obtained, after having provided information on the risks and possible complications. To fulfill our ethical and legal obligations, as recommended by the American Medical Association's Code of Ethics, we have provided information to the patient about our clinical impression; the nature and purpose of the treatment or procedure; the risks, benefits, and possible complications of the intervention; alternatives; the risk(s) and benefit(s) of the alternative treatment(s) or procedure(s); and the risk(s) and benefit(s) of doing nothing. The patient was provided information about the risks and possible complications associated with the procedure. In the case of spinal procedures these may include, but are not limited to, failure to achieve desired goals, infection, bleeding, organ or nerve damage, allergic reactions, paralysis, and death. In addition, the patient was informed that Medicine is not an exact science; therefore, there is also the possibility of unforeseen risks and possible complications that may result in a catastrophic outcome. The patient indicated having understood very clearly. We have given the patient no guarantees and we have made no promises. Enough time was given to the patient to ask questions, all of which were answered to the patient's satisfaction.  Pre-Procedure Preparation: Safety Precautions: Allergies reviewed. Appropriate site, procedure, and patient were confirmed by following the Joint Commission's  Universal Protocol (UP.01.01.01), in the form of a "Time Out". The patient was asked to confirm marked site and procedure, before commencing. The patient was asked about blood thinners, or active infections, both  of which were denied. Patient was assessed for positional comfort and all pressure points were checked before starting procedure. Monitoring:  As per clinic protocol. Infection Control Precautions: Sterile technique used. Standard Universal Precautions were taken as recommended by the Department of [facility] for Disease Control and Prevention (CDC). Standard pre-surgical skin prep was conducted. Respiratory hygiene and cough etiquette was practiced. Hand hygiene observed. Safe injection practices and needle disposal techniques followed. SDV (single dose vial) medications used. Medications properly checked for expiration dates and contaminants. Personal protective equipment (PPE) used: Surgical mask. Sterile double glove technique. Radiation resistant gloves. Sterile surgical gloves.  Anesthesia, Analgesia, Anxiolysis: Type: Local Anesthesia Local Anesthetic(s): Lidocaine 1% Route: Subcutaneous IV Access: Declined. Sedation: Declined. Indication(s):Not applicable.  Description of Procedure Process:  Time-out: "Time-out" completed before starting procedure, as per protocol. Position: Prone Target Area: For Epidural Steroid injections, the target area is the  interlaminar space, initially targeting the lower border of the superior vertebral body lamina. Approach: Posterior approach. Area Prepped: Entire Posterior Lumbosacral Region Prepping solution: ChloraPrep (2% chlorhexidine gluconate and 70% isopropyl alcohol) Safety Precautions: Aspiration looking for blood return was conducted prior to all injections. At no point did we inject any substances, as a needle was being advanced. No attempts were made at seeking any paresthesias. Safe injection practices and needle disposal  techniques used. Medications properly checked for expiration dates. SDV (single dose vial) medications used. Description of the Procedure: Protocol guidelines were followed. The patient was placed in position over the fluoroscopy table. The target area was identified and the area prepped in the usual manner. Skin desensitized using vapocoolant spray. Skin & deeper tissues infiltrated with local anesthetic. Appropriate amount of time allowed to pass for local anesthetics to take effect. The procedure needle was introduced through the skin, ipsilateral to the reported pain, and advanced to the target area. Bone was contacted and the needle walked caudad, until the lamina was cleared. The epidural space was identified using "loss-of-resistance technique" with 2-3 ml of PF-NaCl (0.9% NSS), in a 5cc LOR glass syringe. Proper needle placement secured. Negative aspiration confirmed. Solution injected in intermittent fashion, asking for systemic symptoms every 0.5cc of injectate. The needles were then removed and the area cleansed, making sure to leave some of the prepping solution back to take advantage of its long term bactericidal properties. EBL: None Materials & Medications Used:  Needle(s) Used: 20g - 10cm, Tuohy-style epidural needle Medications Administered today: Ms. Baumbach had no medications administered during this visit.Please see chart orders for dosing details.  Imaging Guidance:  Type of Imaging Technique: Fluoroscopy Guidance (Spinal) Indication(s): Assistance in needle guidance and placement for procedures requiring needle placement in or near specific anatomical locations not easily accessible without such assistance. Exposure Time: Please see nurses notes. Contrast: None used. Fluoroscopic Guidance: I was personally present in the fluoroscopy suite, where the patient was placed in position for the procedure, over the fluoroscopy-compatible table. Fluoroscopy was manipulated, using "Tunnel Vision  Technique", to obtain the best possible view of the target area, on the affected side. Parallax error was corrected before commencing the procedure. A "direction-depth-direction" technique was used to introduce the needle under continuous pulsed fluoroscopic guidance. Once the target was reached, antero-posterior, oblique, and lateral fluoroscopic projection views were taken to confirm needle placement in all planes. Permanently recorded images stored by scanning into EMR. Interpretation: Intraoperative imaging interpretation by performing Physician. Adequate needle placement confirmed. Adequate needle placement confirmed in AP, lateral, & Oblique Views. No contrast injected.  Antibiotics:  Type:  Antibiotics Given (last 72 hours)    None      Indication(s): No indications identified.  Post-operative Assessment:  Complications: No immediate post-treatment complications were observed. Relevant Post-operative Information:  Disposition: Return to clinic for follow-up evaluation. The patient tolerated the entire procedure well. A repeat set of vitals were taken after the procedure and the patient was kept under observation following institutional policy, for this procedure. Post-procedural neurological assessment was performed, showing return to baseline, prior to discharge. The patient was discharged home, once institutional criteria were met. The patient was provided with post-procedure discharge instructions, including a section on how to identify potential problems. Should any problems arise concerning this procedure, the patient was given instructions to immediately contact us, at any time, without hesitation. In any case, we plan to contact the patient by telephone for a follow-up status report regarding this interventional procedure. Comments:  No additional relevant information.  Primary Care Physician: [name], MD Location: [facility] Note by: Kathlen Brunswick.  Dossie Arbour, M.D, DABA, DABAPM, DABPM, DABIPP, FIPP  Disclaimer:  Medicine is not an exact science. The only guarantee in medicine is that nothing is guaranteed. It is important to note that the decision to proceed with this intervention was based on the information collected from the patient. The Data and conclusions were drawn from the patient's questionnaire, the interview, and the physical examination. Because the information was provided in large part by the patient, it cannot be guaranteed that it has not been purposely or unconsciously manipulated. Every effort has been made to obtain as much relevant data as possible for this evaluation. It is important to note that the conclusions that lead to this procedure are derived in large part from the available data. Always take into account that the treatment will also be dependent on availability of resources and existing treatment guidelines, considered by other Pain Management Practitioners as being common knowledge and practice, at the time of the intervention. For Medico-Legal purposes, it is also important to point out that variation in procedural techniques and pharmacological choices are the acceptable norm. The indications, contraindications, technique, and results of the above procedure should only be interpreted and judged by a Board-Certified Interventional Pain Specialist with extensive familiarity and expertise in the same exact procedure and technique. Attempts at providing opinions without similar or greater experience and expertise than that of the treating physician will be considered as inappropriate and unethical, and shall result in a formal complaint to the state medical board and applicable specialty societies.

## 2015-09-27 NOTE — Progress Notes (Signed)
Safety precautions to be maintained throughout the outpatient stay will include: orient to surroundings, keep bed in low position, maintain call bell within reach at all times, provide assistance with transfer out of bed and ambulation.  

## 2015-09-28 ENCOUNTER — Telehealth: Payer: Self-pay

## 2015-09-28 NOTE — Telephone Encounter (Signed)
Patient states she is doing OK 

## 2015-10-11 ENCOUNTER — Ambulatory Visit: Payer: Commercial Managed Care - HMO | Attending: Pain Medicine | Admitting: Pain Medicine

## 2015-10-11 ENCOUNTER — Encounter: Payer: Self-pay | Admitting: Pain Medicine

## 2015-10-11 VITALS — BP 113/60 | HR 100 | Temp 98.8°F | Resp 18 | Ht 63.0 in | Wt 170.0 lb

## 2015-10-11 DIAGNOSIS — M797 Fibromyalgia: Secondary | ICD-10-CM | POA: Insufficient documentation

## 2015-10-11 DIAGNOSIS — F17200 Nicotine dependence, unspecified, uncomplicated: Secondary | ICD-10-CM | POA: Insufficient documentation

## 2015-10-11 DIAGNOSIS — E785 Hyperlipidemia, unspecified: Secondary | ICD-10-CM | POA: Insufficient documentation

## 2015-10-11 DIAGNOSIS — M549 Dorsalgia, unspecified: Secondary | ICD-10-CM | POA: Diagnosis present

## 2015-10-11 DIAGNOSIS — K219 Gastro-esophageal reflux disease without esophagitis: Secondary | ICD-10-CM | POA: Insufficient documentation

## 2015-10-11 DIAGNOSIS — G8929 Other chronic pain: Secondary | ICD-10-CM

## 2015-10-11 DIAGNOSIS — K2270 Barrett's esophagus without dysplasia: Secondary | ICD-10-CM | POA: Diagnosis not present

## 2015-10-11 DIAGNOSIS — F1190 Opioid use, unspecified, uncomplicated: Secondary | ICD-10-CM | POA: Diagnosis not present

## 2015-10-11 DIAGNOSIS — M48061 Spinal stenosis, lumbar region without neurogenic claudication: Secondary | ICD-10-CM

## 2015-10-11 DIAGNOSIS — M4316 Spondylolisthesis, lumbar region: Secondary | ICD-10-CM

## 2015-10-11 DIAGNOSIS — M5416 Radiculopathy, lumbar region: Secondary | ICD-10-CM | POA: Diagnosis not present

## 2015-10-11 DIAGNOSIS — M4806 Spinal stenosis, lumbar region: Secondary | ICD-10-CM | POA: Diagnosis not present

## 2015-10-11 DIAGNOSIS — M545 Low back pain: Secondary | ICD-10-CM | POA: Insufficient documentation

## 2015-10-11 NOTE — Progress Notes (Signed)
Safety precautions to be maintained throughout the outpatient stay will include: orient to surroundings, keep bed in low position, maintain call bell within reach at all times, provide assistance with transfer out of bed and ambulation.  

## 2015-10-11 NOTE — Progress Notes (Signed)
Patient's Name: Cynthia Bennett MRN: [id number] DOB: 1933/06/06 DOS: 10/11/2015  Primary Reason(s) for Visit: Post-Procedure evaluation CC: Back Pain   HPI:   Cynthia Bennett is a 81 y.o. year old, female patient, who returns today as an established patient. She has Fibromyalgia; Adiposity; Current tobacco use; Bilateral low back pain with left-sided sciatica; Osteoarthritis of spine with radiculopathy, lumbar region; Chronic pain syndrome; Lumbar radicular pain (Bilateral) (R>L); Encounter for therapeutic drug level monitoring; Long term current use of opiate analgesic; Long term prescription opiate use; Uncomplicated opioid dependence ([facility]); Opiate use; Acid reflux; HLD (hyperlipidemia); Cannot sleep; Primary open angle glaucoma; Post menopausal syndrome; Temporary cerebral vascular dysfunction; Chronic low back pain; Grade 1 spondylolisthesis of L4 over L5; Severe (5-6 mm) L4-5 lumbar spinal stenosis and (8 mm) L3-4 spinal stenosis.; Lumbar spine pain; Lumbar facet arthropathy (Bilateral); Lumbar discogenic pain syndrome; Lumbar facet syndrome (Bilateral); Lumbar facet hypertrophy (Bilateral); Chronic left sacroiliac joint pain; Chronic left hip pain; Arthropathy of left hip; Peptic ulcer disease; Morbid obesity ([facility]); GERD (gastroesophageal reflux disease); Chronic pain; Encounter for chronic pain management; Opiate analgesic contract exists [facility]); Lumbar spondylosis; Abnormal MRI, lumbar spine (03/20/2000); and Cervical spondylosis (Anterolisthesis of C5 over C6) on her problem list.. Her primarily concern today is the Back Pain     The patient returns to the clinic today after having had a right-sided L5-S1 lumbar epidural steroid injection under fluoroscopic guidance, without IV sedation. She indicates feeling much better.  Today's Pain Score: 2  Reported level of pain is compatible with clinical observation Pain Type: Chronic pain Pain Location: Back Pain  Orientation: Lower Pain Descriptors / Indicators: Aching, Constant Pain Frequency: Constant  Date of Last Visit: 09/27/15 Service Provided on Last Visit:  (lesi)  Pharmacotherapy Review:   Side-effects or Adverse reactions: None reported. Effectiveness: Described as relatively effective, allowing for increase in activities of daily living (ADL). Onset of action: Within expected pharmacological parameters. Duration of action: Within normal limits for medication. Peak effect: Timing and results are as within normal expected parameters. [place] PMP: Compliant with practice rules and regulations. Medication Assessment Form: Reviewed. Patient indicates being compliant with therapy Treatment compliance: Compliant. Substance Use Disorder (SUD) Risk Level: Low Pharmacologic Plan: Continue therapy as is.  Last Available Lab Work: No visits with results within 3 Month(s) from this visit. Latest known visit with results is:  Admission on 04/16/2015, Discharged on 04/16/2015  Component Date Value Ref Range Status  . SURGICAL PATHOLOGY 04/16/2015    Final                   Value:Surgical Pathology CASE: [id number] PATIENT: Cynthia Bennett Surgical Pathology Report     SPECIMEN SUBMITTED: A. GEJ, cbx  CLINICAL HISTORY: None provided  PRE-OPERATIVE DIAGNOSIS: Dysphagia, Barrett's esophagus without dysplasia, GERD  POST-OPERATIVE DIAGNOSIS: Barrett's esophagus     DIAGNOSIS: A. GE JUNCTION; COLD BIOPSY: - SQUAMOCOLUMNAR MUCOSA WITH REFLUX GASTROESOPHAGITIS. - COLUMNAR MUCOSA WITH GOBLET CELLS. - NEGATIVE FOR DYSPLASIA AND MALIGNANCY.  Note If a characteristic lesion was identified endoscopically within the GE junction, the histologic findings fulfill criteria for Barrett's esophagus.     GROSS DESCRIPTION:  A. Labeled: gej C biopsy Tissue Fragment(s): 4 Measurement: 0.1-0.4 cm Comment: pink  Entirely submitted in cassette(s): 1   Final Diagnosis performed by Glenice Bow, MD.  Electronically signed 04/17/2015 1:59:03PM    The electronic signature indicates that the named Attending Pathologist has evaluated the specime  Chiropractor component performed at [place], [address], [place], [place] [address] Lab: [phone] Dir: Titus Dubin. Cato Mulligan, MD  Professional component performed at [facility], [facility], [address] [place], [place], [place] [address] Lab: [phone] Dir: Georgiann Cocker. Rubinas, MD     Procedure Assessment:  Procedure done on last visit: Right-sided L5-S1 lumbar epidural steroid injection under fluoroscopic guidance, without sedation. Side-effects or Adverse reactions: None reported. Sedation: No sedation used during procedure.  Results: Ultra-Short Term Relief (First 1 hour after procedure): 100 %  Ultra-short term relief is a normal physiological response to analgesics and anesthetics provided during the procedure. Short Term Relief (Initial 4-6 hrs after procedure): 100 % Short-term relief confirms injected site as etiology of pain. Long Term Relief : 90 % (ongoing) Long term relief is possibly due to sympathetic blockade, or the anti-inflammatory effects of steroids, if administered during procedure. Current Relief (Now):  90% Persistent relief would suggest effective anti-inflammatory effects from steroids. Interpretation of Results: Excellent response to the palliative treatment. We will repeat, as needed.  Allergies: Cynthia Bennett is allergic to nsaids.  Meds: The patient has a current medication list which includes the following prescription(s): bupropion, dorzolamide-timolol, doxepin, hydrocodone-acetaminophen, hydrocodone-acetaminophen, hydrocodone-acetaminophen, multivitamin with minerals, omeprazole, sertraline, and zolpidem, and the following Facility-Administered Medications: lidocaine (pf), ropivacaine (pf) 2 mg/ml (0.2%), sodium chloride, and triamcinolone acetonide. Requested  Prescriptions    No prescriptions requested or ordered in this encounter    ROS: Constitutional: Afebrile, no chills, well hydrated and well nourished Gastrointestinal: negative Musculoskeletal:negative Neurological: negative Behavioral/Psych: negative  PFSH: Medical:  Cynthia Bennett  has a past medical history of TIA (transient ischemic attack); Spinal stenosis; Osteoarthritis; Fibromyalgia; Glaucoma; Peptic ulcer disease; Stroke (HCC); Reflux; and Hiatal hernia. Family: family history includes COPD in her father; Mental illness in her mother. Surgical:  has past surgical history that includes Esophagogastroduodenoscopy (N/A, 04/16/2015); Appendectomy; and Abdominal hysterectomy. Tobacco:  reports that she has quit smoking. She does not have any smokeless tobacco history on file. Alcohol:  reports that she does not drink alcohol. Drug:  reports that she does not use illicit drugs.  Physical Exam: Vitals:  Today's Vitals   10/11/15 1048 10/11/15 1050  BP:  113/60  Pulse: 100   Temp: 98.8 F (37.1 C)   Resp: 18   Height:  (1.6 m)   Weight: 170 lb (77.111 kg)   SpO2: 98%   PainSc: 2  2   PainLoc: Back   Calculated BMI: Body mass index is 30.12 kg/(m^2). General appearance: alert, cooperative, appears stated age, no distress and morbidly obese Eyes: conjunctivae/corneas clear. PERRL, EOM's intact. Fundi benign. Lungs: No evidence respiratory distress, no audible rales or ronchi and no use of accessory muscles of respiration Neck: no adenopathy, no carotid bruit, no JVD, supple, symmetrical, trachea midline and thyroid not enlarged, symmetric, no tenderness/mass/nodules Lumbar Spine Palpable Trigger Points: Negative bilaterally Lumbar Hyperextension and rotation: Negative bilaterally Patrick's Maneuver: Negative bilaterally Lower Extremities ROM: Adequate bilaterally Gait: WNL PT & DP Pulses: Palpable bilaterally Skin: Skin color, texture, turgor normal. No rashes or  lesions Neurologic: Grossly normal    Assessment: Encounter Diagnosis:  Primary Diagnosis: Chronic pain [G89.29]  Plan: Cynthia Bennett was seen today for back pain.  Diagnoses and all orders for this visit:  Chronic pain  Grade 1 spondylolisthesis of L4 over L5  Lumbar radicular pain (Bilateral) (R>L)  Severe (5-6 mm) L4-5 lumbar spinal stenosis and (8 mm) L3-4 spinal stenosis.     There are no  Patient Instructions on file for this visit. Medications discontinued today:  There are no discontinued medications. Medications administered today:  Cynthia Bennett had no medications administered during this visit.  Primary Care Physician: Jerl MinaHEDRICK, JAMES, MD [facility] Note by: Sydnee LevansFrancisco A. Laban EmperorNaveira, M.D, DABA, DABAPM, DABPM, DABIPP, FIPP

## 2015-11-13 DIAGNOSIS — F5104 Psychophysiologic insomnia: Secondary | ICD-10-CM | POA: Diagnosis not present

## 2015-11-13 DIAGNOSIS — K219 Gastro-esophageal reflux disease without esophagitis: Secondary | ICD-10-CM | POA: Diagnosis not present

## 2015-11-13 DIAGNOSIS — F329 Major depressive disorder, single episode, unspecified: Secondary | ICD-10-CM | POA: Diagnosis not present

## 2015-11-13 DIAGNOSIS — E785 Hyperlipidemia, unspecified: Secondary | ICD-10-CM | POA: Diagnosis not present

## 2015-11-14 DIAGNOSIS — N390 Urinary tract infection, site not specified: Secondary | ICD-10-CM | POA: Diagnosis not present

## 2015-11-22 DIAGNOSIS — N390 Urinary tract infection, site not specified: Secondary | ICD-10-CM | POA: Diagnosis not present

## 2015-11-28 ENCOUNTER — Encounter: Payer: Commercial Managed Care - HMO | Admitting: Pain Medicine

## 2016-01-23 ENCOUNTER — Ambulatory Visit: Payer: PPO | Attending: Pain Medicine | Admitting: Pain Medicine

## 2016-01-23 ENCOUNTER — Encounter: Payer: Self-pay | Admitting: Pain Medicine

## 2016-01-23 VITALS — BP 168/57 | HR 118 | Temp 98.4°F | Resp 16 | Ht 62.0 in | Wt 175.0 lb

## 2016-01-23 DIAGNOSIS — Z5181 Encounter for therapeutic drug level monitoring: Secondary | ICD-10-CM | POA: Diagnosis not present

## 2016-01-23 DIAGNOSIS — M47816 Spondylosis without myelopathy or radiculopathy, lumbar region: Secondary | ICD-10-CM | POA: Insufficient documentation

## 2016-01-23 DIAGNOSIS — Z87891 Personal history of nicotine dependence: Secondary | ICD-10-CM | POA: Insufficient documentation

## 2016-01-23 DIAGNOSIS — K219 Gastro-esophageal reflux disease without esophagitis: Secondary | ICD-10-CM | POA: Insufficient documentation

## 2016-01-23 DIAGNOSIS — M79605 Pain in left leg: Secondary | ICD-10-CM | POA: Diagnosis not present

## 2016-01-23 DIAGNOSIS — M533 Sacrococcygeal disorders, not elsewhere classified: Secondary | ICD-10-CM | POA: Diagnosis not present

## 2016-01-23 DIAGNOSIS — M5442 Lumbago with sciatica, left side: Secondary | ICD-10-CM

## 2016-01-23 DIAGNOSIS — K279 Peptic ulcer, site unspecified, unspecified as acute or chronic, without hemorrhage or perforation: Secondary | ICD-10-CM | POA: Insufficient documentation

## 2016-01-23 DIAGNOSIS — M25552 Pain in left hip: Secondary | ICD-10-CM | POA: Diagnosis not present

## 2016-01-23 DIAGNOSIS — M4316 Spondylolisthesis, lumbar region: Secondary | ICD-10-CM | POA: Diagnosis not present

## 2016-01-23 DIAGNOSIS — N319 Neuromuscular dysfunction of bladder, unspecified: Secondary | ICD-10-CM | POA: Diagnosis not present

## 2016-01-23 DIAGNOSIS — M797 Fibromyalgia: Secondary | ICD-10-CM | POA: Insufficient documentation

## 2016-01-23 DIAGNOSIS — M4806 Spinal stenosis, lumbar region: Secondary | ICD-10-CM | POA: Insufficient documentation

## 2016-01-23 DIAGNOSIS — M5416 Radiculopathy, lumbar region: Secondary | ICD-10-CM

## 2016-01-23 DIAGNOSIS — H409 Unspecified glaucoma: Secondary | ICD-10-CM | POA: Insufficient documentation

## 2016-01-23 DIAGNOSIS — M1612 Unilateral primary osteoarthritis, left hip: Secondary | ICD-10-CM | POA: Diagnosis not present

## 2016-01-23 DIAGNOSIS — Z8673 Personal history of transient ischemic attack (TIA), and cerebral infarction without residual deficits: Secondary | ICD-10-CM | POA: Diagnosis not present

## 2016-01-23 DIAGNOSIS — M48061 Spinal stenosis, lumbar region without neurogenic claudication: Secondary | ICD-10-CM

## 2016-01-23 DIAGNOSIS — M545 Low back pain: Secondary | ICD-10-CM | POA: Diagnosis not present

## 2016-01-23 DIAGNOSIS — M47812 Spondylosis without myelopathy or radiculopathy, cervical region: Secondary | ICD-10-CM | POA: Insufficient documentation

## 2016-01-23 DIAGNOSIS — E785 Hyperlipidemia, unspecified: Secondary | ICD-10-CM | POA: Diagnosis not present

## 2016-01-23 DIAGNOSIS — G894 Chronic pain syndrome: Secondary | ICD-10-CM

## 2016-01-23 DIAGNOSIS — M549 Dorsalgia, unspecified: Secondary | ICD-10-CM | POA: Diagnosis not present

## 2016-01-23 DIAGNOSIS — Z79891 Long term (current) use of opiate analgesic: Secondary | ICD-10-CM | POA: Diagnosis not present

## 2016-01-23 DIAGNOSIS — M79602 Pain in left arm: Secondary | ICD-10-CM

## 2016-01-23 DIAGNOSIS — G8929 Other chronic pain: Secondary | ICD-10-CM

## 2016-01-23 DIAGNOSIS — R39198 Other difficulties with micturition: Secondary | ICD-10-CM

## 2016-01-23 MED ORDER — HYDROCODONE-ACETAMINOPHEN 5-325 MG PO TABS: 1.0000 | ORAL_TABLET | Freq: Two times a day (BID) | ORAL | Status: DC | PRN

## 2016-01-23 NOTE — Progress Notes (Signed)
Patient's Name: Cynthia Bennett MRN: [id number] DOB: 1933-10-19 DOS: 01/23/2016  Primary Reason(s) for Visit: Post-Procedure evaluation and pharmacological management of her chronic pain. CC: Back Pain   HPI  Ms. Mcclenathan is a 82 y.o. year old, female patient, who returns today as an established patient. She has Fibromyalgia; Adiposity; Current tobacco use; Bilateral low back pain with left-sided sciatica; Chronic pain syndrome; Lumbar radicular pain (Bilateral) (R>L); Encounter for therapeutic drug level monitoring; Long term current use of opiate analgesic; Long term prescription opiate use; Uncomplicated opioid dependence (HCC); Opiate use; Acid reflux; HLD (hyperlipidemia); Cannot sleep; Primary open angle glaucoma; Post menopausal syndrome; Temporary cerebral vascular dysfunction; Chronic low back pain (Location of Primary Source of Pain) (Bilateral) (L>R); Grade 1 spondylolisthesis of L4 over L5; Severe (5-6 mm) L4-5 lumbar spinal stenosis and (8 mm) L3-4 spinal stenosis.; Lumbar discogenic pain syndrome; Lumbar facet syndrome (Location of Primary Source of Pain) (Bilateral) (L>R); Lumbar facet hypertrophy (Bilateral); Chronic sacroiliac joint pain (Left); Chronic hip pain (Left); Peptic ulcer disease; Morbid obesity (HCC); GERD (gastroesophageal reflux disease); Chronic pain; Encounter for chronic pain management; Opiate analgesic contract exists [employer] [facility]); Lumbar spondylosis; Abnormal MRI, lumbar spine (03/20/2000); Cervical spondylosis (Anterolisthesis of C5 over C6); Difficulty voiding; Neurogenic bladder; Chronic lower extremity pain (Location of Secondary source of pain) (Left); Chronic lumbar radicular pain (Location of Secondary source of pain) (Left) (L5 Dermatome); and Osteoarthritis of hip (Left) on her problem list.. Her primarily concern today is the Back Pain   The patient returns to the clinics today after having had a right-sided L5-S1 lumbar epidural  steroid injection under fluoroscopic guidance, without sedation on 09/27/2015. She comes in today for pharmacological management of her chronic pain.  Pain Assessment: Self-Reported Pain Score: 8 , clinically she looks like a 4-5/10. Reported level is compatible with observation Pain Type: Chronic pain Pain Location: Back Pain Orientation: Right, Left, Lower Pain Descriptors / Indicators: Sharp, Aching Pain Frequency: Constant  Date of Last Visit: 10/11/15 Service Provided on Last Visit: Med Refill  Controlled Substance Pharmacotherapy Assessment  Analgesic: Hydrocodone/APAP 5/325 one tablet every 6 hours (20 mg/day) MME/day: 30 mg/day Pharmacokinetics: Onset of action (Liberation/Absorption): Within expected pharmacological parameters Time to Peak effect (Distribution): Timing and results are as within normal expected parameters Duration of action (Metabolism/Excretion): Within normal limits for medication Pharmacodynamics: Analgesic Effect: More than 50% Activity Facilitation: Medication(s) allow patient to sit, stand, walk, and do the basic ADLs Perceived Effectiveness: Described as relatively effective, allowing for increase in activities of daily living (ADL) Side-effects or Adverse reactions: None reported Monitoring: [place] PMP: Compliant with practice rules and regulations UDS Results/interpretation: The patient's last UDS was done on 08/30/2015 and it came back within normal limits with no unexpected results. Medication Assessment Form: Reviewed. Patient indicates being compliant with therapy Treatment compliance: Compliant Risk Assessment: Aberrant Behavior: None observed today Substance Use Disorder (SUD) Risk Level: Low Opioid Risk Tool (ORT) Score: Total Score: 1 Low Risk for SUD (Score <3) Depression Scale Score: PHQ-2: PHQ-2 Total Score: 0 No depression (0) PHQ-9: PHQ-9 Total Score: 0 No depression (0-4)  Pharmacologic Plan: No change in therapy, at this  time   Laboratory Workup  Last ED UDS: No results found for: THCU, COCAINSCRNUR, PCPSCRNUR, MDMA, AMPHETMU, METHADONE, ETOH  Inflammation Markers No results found for: ESRSEDRATE, CRP  Renal Function Lab Results  Component Value Date   BUN 21* 04/28/2014   CREATININE 0.89 04/28/2014   GFRAA >60 04/28/2014   GFRNONAA >60 04/28/2014  Hepatic Function Lab Results  Component Value Date   AST 22 04/27/2014   ALT 12 04/27/2014   ALBUMIN 3.2* 04/27/2014    Electrolytes Lab Results  Component Value Date   NA 143 04/28/2014   K 4.0 04/28/2014   CL 115* 04/28/2014   CALCIUM 8.0* 04/28/2014   MG 1.7* 12/31/2011    Allergies  Ms. Winemiller is allergic to ibuprofen and nsaids.  Meds  The patient has a current medication list which includes the following prescription(s): bupropion, doxepin, hydrocodone-acetaminophen, hydrocodone-acetaminophen, hydrocodone-acetaminophen, multivitamin with minerals, pantoprazole, sertraline, and zolpidem.  Current Outpatient Prescriptions on File Prior to Visit  Medication Sig  . buPROPion (WELLBUTRIN XL) 150 MG 24 hr tablet Take 150 mg by mouth daily.  Marland Kitchen doxepin (SINEQUAN) 100 MG capsule Take 100 mg by mouth at bedtime.  . Multiple Vitamin (MULTIVITAMIN WITH MINERALS) TABS tablet Take 1 tablet by mouth daily.  . sertraline (ZOLOFT) 100 MG tablet Take 100 mg by mouth daily.   No current facility-administered medications on file prior to visit.    ROS  Constitutional: Afebrile, no chills, well hydrated and well nourished Gastrointestinal: negative Musculoskeletal:negative Neurological: negative Behavioral/Psych: negative  PFSH  Medical:  Ms. Orellana  has a past medical history of TIA (transient ischemic attack); Spinal stenosis; Osteoarthritis; Fibromyalgia; Glaucoma; Peptic ulcer disease; Stroke (HCC); Reflux; Hiatal hernia; and Osteoarthritis of spine with radiculopathy, lumbar region (08/14/2015). Family: family history includes COPD in her  father; Mental illness in her mother. Surgical:  has past surgical history that includes Esophagogastroduodenoscopy (N/A, 04/16/2015); Appendectomy; and Abdominal hysterectomy. Tobacco:  reports that she has quit smoking. She does not have any smokeless tobacco history on file. Alcohol:  reports that she does not drink alcohol. Drug:  reports that she does not use illicit drugs.  Physical Exam  Vitals:  Today's Vitals   01/23/16 1023 01/23/16 1024  BP:  168/57  Pulse: 118   Temp: 98.4 F (36.9 C)   Resp: 16   Height: 5\' 2"  (1.575 m)   Weight: 175 lb (79.379 kg)   SpO2: 98%   PainSc: 8  8   PainLoc: Back     Calculated BMI: Body mass index is 32 kg/(m^2). Obese (Class I) (30-34.9 kg/m2) - 68% higher incidence of chronic pain  General appearance: alert, cooperative, appears stated age, mild distress and moderately obese Eyes: PERLA Respiratory: No evidence respiratory distress, no audible rales or ronchi and no use of accessory muscles of respiration  Cervical Spine Inspection: Normal anatomy Alignment: Symetrical ROM: Adequate  Upper Extremities Inspection: No gross anomalies detected ROM: Adequate Sensory: Normal Motor: Unremarkable  Thoracic Spine Inspection: No gross anomalies detected Alignment: Symetrical ROM: Adequate  Lumbar Spine Inspection: No gross anomalies detected Alignment: Symetrical ROM: Decreased  Gait: Antalgic (limping)  Lower Extremities Inspection: No gross anomalies detected ROM: Adequate Sensory:  Normal Motor: Unremarkable  Assessment & Plan  Primary Diagnosis & Pertinent Problem List: The primary encounter diagnosis was Chronic pain. Diagnoses of Encounter for therapeutic drug level monitoring, Long term current use of opiate analgesic, Bilateral low back pain with left-sided sciatica, Chronic pain syndrome, Grade 1 spondylolisthesis of L4 over L5, Severe (5-6 mm) L4-5 lumbar spinal stenosis and (8 mm) L3-4 spinal stenosis., Difficulty  voiding, Neurogenic bladder, Chronic pain of lower extremity, left, Chronic lower extremity pain (Location of Secondary source of pain) (Left), Chronic lumbar radicular pain (Location of Secondary source of pain) (Left) (L5 Dermatome), and Primary osteoarthritis of left hip were also pertinent to this visit.  Visit Diagnosis: 1. Chronic pain   2. Encounter for therapeutic drug level monitoring   3. Long term current use of opiate analgesic   4. Bilateral low back pain with left-sided sciatica   5. Chronic pain syndrome   6. Grade 1 spondylolisthesis of L4 over L5   7. Severe (5-6 mm) L4-5 lumbar spinal stenosis and (8 mm) L3-4 spinal stenosis.   8. Difficulty voiding   9. Neurogenic bladder   10. Chronic pain of lower extremity, left   11. Chronic lower extremity pain (Location of Secondary source of pain) (Left)   12. Chronic lumbar radicular pain (Location of Secondary source of pain) (Left) (L5 Dermatome)   13. Primary osteoarthritis of left hip     Problem-specific Plan(s): No problem-specific assessment & plan notes found for this encounter.   Plan of Care  Pharmacotherapy (Medications Ordered): Meds ordered this encounter  Medications  . HYDROcodone-acetaminophen (NORCO/VICODIN) 5-325 MG tablet    Sig: Take 1 tablet by mouth every 12 (twelve) hours as needed for moderate pain or severe pain.    Dispense:  60 tablet    Refill:  0    Do not place this medication, or any other prescription from our practice, on "Automatic Refill". Patient may have prescription filled one day early if pharmacy is closed on scheduled refill date. Do not fill until: 01/23/16 To last until: 02/22/16  . HYDROcodone-acetaminophen (NORCO/VICODIN) 5-325 MG tablet    Sig: Take 1 tablet by mouth every 12 (twelve) hours as needed for moderate pain or severe pain.    Dispense:  60 tablet    Refill:  0    Do not place this medication, or any other prescription from our practice, on "Automatic Refill".  Patient may have prescription filled one day early if pharmacy is closed on scheduled refill date. Do not fill until: 02/22/16 To last until: 03/23/16  . HYDROcodone-acetaminophen (NORCO/VICODIN) 5-325 MG tablet    Sig: Take 1 tablet by mouth every 12 (twelve) hours as needed for moderate pain or severe pain.    Dispense:  60 tablet    Refill:  0    Do not place this medication, or any other prescription from our practice, on "Automatic Refill". Patient may have prescription filled one day early if pharmacy is closed on scheduled refill date. Do not fill until: 03/23/16 To last until: 04/22/16    [facility] & Procedure Ordered: Orders Placed This Encounter  Procedures  . LUMBAR EPIDURAL STEROID INJECTION    Standing Status: Future     Number of Occurrences:      Standing Expiration Date: 01/22/2017    Scheduling Instructions:     Side: Left-sided     Sedation: No Sedation.     Timeframe: ASAP    Order Specific Question:  Where will this procedure be performed?    Answer:  [facility]  . LUMBAR FACET(MEDIAL BRANCH NERVE BLOCK) MBNB    Standing Status: Standing     Number of Occurrences: 1     Standing Expiration Date: 01/22/2017    Scheduling Instructions:     Side: Bilateral     Level: L2, L3, L4, L5, & S1 Medial Branch Nerve     Sedation: With Sedation.     Timeframe: PRN Procedure. Patient will call to schedule.    Order Specific Question:  Where will this procedure be performed?    Answer:  [facility]  . ToxASSURE Select 13 (MW), Urine    Volume: 30  ml(s). Minimum 3 ml of urine is needed. Document temperature of fresh sample. Indications: Long term (current) use of opiate analgesic (Z79.891)  . Comprehensive metabolic panel    Standing Status: Future     Number of Occurrences:      Standing Expiration Date: 02/22/2016    Order Specific Question:  Has the patient fasted?    Answer:  No  . C-reactive protein    Standing Status: Future     Number of  Occurrences:      Standing Expiration Date: 02/22/2016  . Magnesium    Standing Status: Future     Number of Occurrences:      Standing Expiration Date: 02/22/2016  . Sedimentation rate    Standing Status: Future     Number of Occurrences:      Standing Expiration Date: 02/22/2016  . Vitamin D pnl(25-hydrxy+1,25-dihy)-bld    Standing Status: Future     Number of Occurrences:      Standing Expiration Date: 02/22/2016  . Ambulatory referral to Urology    Referral Priority:  Routine    Referral Type:  Consultation    Referral Reason:  Specialty Services Required    Requested Specialty:  Urology    Number of Visits Requested:  1    Imaging Ordered: AMB REFERRAL TO UROLOGY  Interventional Therapies: Scheduled: Left L5-S1 lumbar epidural steroid injection under fluoroscopic guidance, no sedation. PRN Procedures: Diagnostic bilateral lumbar facet block under fluoroscopic guidance and IV sedation.    Referral(s) or Consult(s): Urology consult to ever relate for neurogenic bladder and difficulty voiding. Were asking them to consider urodynamic studies.  Medications administered during this visit: [name] had no medications administered during this visit.  Future Appointments Date Time Provider Department Center  04/23/2016 10:40 AM Delano MetzFrancisco Rande Dario, [facility]    Primary Care Physician: Jerl MinaHEDRICK, JAMES, MD [facility] Note by: Sydnee LevansFrancisco A. Laban EmperorNaveira, M.D, DABA, DABAPM, DABPM, DABIPP, FIPP  Pain Score Disclaimer: We use the NRS-11 scale. This is a self-reported, subjective measurement of pain severity with only modest accuracy. It is used primarily to identify changes within a particular patient. It must be understood that outpatient pain scales are significantly less accurate that those used for research, where they can be applied under ideal controlled circumstances with minimal exposure to variables. In reality, the score is likely to be a  combination of pain intensity and pain affect, where pain affect describes the degree of emotional arousal or changes in action readiness caused by the sensory experience of pain. Factors such as social and work situation, setting, emotional state, anxiety levels, expectation, and prior pain experience may influence pain perception and show large inter-individual differences that may also be affected by time variables.

## 2016-01-23 NOTE — Patient Instructions (Signed)
In[facility]

## 2016-01-23 NOTE — Progress Notes (Signed)
Safety precautions to be maintained throughout the outpatient stay will include: orient to surroundings, keep bed in low position, maintain call bell within reach at all times, provide assistance with transfer out of bed and ambulation.  

## 2016-01-28 DIAGNOSIS — H401133 Primary open-angle glaucoma, bilateral, severe stage: Secondary | ICD-10-CM | POA: Diagnosis not present

## 2016-01-28 LAB — TOXASSURE SELECT 13 (MW), URINE: PDF: 0

## 2016-01-31 DIAGNOSIS — F5104 Psychophysiologic insomnia: Secondary | ICD-10-CM | POA: Diagnosis not present

## 2016-02-05 ENCOUNTER — Encounter: Payer: Self-pay | Admitting: Pain Medicine

## 2016-02-05 ENCOUNTER — Ambulatory Visit: Payer: PPO | Attending: Pain Medicine | Admitting: Pain Medicine

## 2016-02-05 VITALS — BP 119/70 | HR 57 | Temp 98.7°F | Resp 16 | Ht 62.0 in | Wt 175.0 lb

## 2016-02-05 DIAGNOSIS — N319 Neuromuscular dysfunction of bladder, unspecified: Secondary | ICD-10-CM | POA: Diagnosis not present

## 2016-02-05 DIAGNOSIS — M47896 Other spondylosis, lumbar region: Secondary | ICD-10-CM | POA: Insufficient documentation

## 2016-02-05 DIAGNOSIS — F17200 Nicotine dependence, unspecified, uncomplicated: Secondary | ICD-10-CM | POA: Insufficient documentation

## 2016-02-05 DIAGNOSIS — M797 Fibromyalgia: Secondary | ICD-10-CM | POA: Insufficient documentation

## 2016-02-05 DIAGNOSIS — M1612 Unilateral primary osteoarthritis, left hip: Secondary | ICD-10-CM | POA: Diagnosis not present

## 2016-02-05 DIAGNOSIS — M5442 Lumbago with sciatica, left side: Secondary | ICD-10-CM

## 2016-02-05 DIAGNOSIS — G8929 Other chronic pain: Secondary | ICD-10-CM | POA: Insufficient documentation

## 2016-02-05 DIAGNOSIS — E669 Obesity, unspecified: Secondary | ICD-10-CM | POA: Diagnosis not present

## 2016-02-05 DIAGNOSIS — Z79891 Long term (current) use of opiate analgesic: Secondary | ICD-10-CM | POA: Insufficient documentation

## 2016-02-05 DIAGNOSIS — Z780 Asymptomatic menopausal state: Secondary | ICD-10-CM | POA: Insufficient documentation

## 2016-02-05 DIAGNOSIS — E785 Hyperlipidemia, unspecified: Secondary | ICD-10-CM | POA: Diagnosis not present

## 2016-02-05 DIAGNOSIS — K219 Gastro-esophageal reflux disease without esophagitis: Secondary | ICD-10-CM | POA: Insufficient documentation

## 2016-02-05 DIAGNOSIS — M25552 Pain in left hip: Secondary | ICD-10-CM | POA: Insufficient documentation

## 2016-02-05 DIAGNOSIS — M549 Dorsalgia, unspecified: Secondary | ICD-10-CM | POA: Diagnosis not present

## 2016-02-05 DIAGNOSIS — M4806 Spinal stenosis, lumbar region: Secondary | ICD-10-CM | POA: Diagnosis not present

## 2016-02-05 DIAGNOSIS — M48061 Spinal stenosis, lumbar region without neurogenic claudication: Secondary | ICD-10-CM

## 2016-02-05 DIAGNOSIS — M4726 Other spondylosis with radiculopathy, lumbar region: Secondary | ICD-10-CM | POA: Diagnosis not present

## 2016-02-05 DIAGNOSIS — M5440 Lumbago with sciatica, unspecified side: Secondary | ICD-10-CM | POA: Insufficient documentation

## 2016-02-05 DIAGNOSIS — M5416 Radiculopathy, lumbar region: Secondary | ICD-10-CM | POA: Diagnosis not present

## 2016-02-05 DIAGNOSIS — M4316 Spondylolisthesis, lumbar region: Secondary | ICD-10-CM | POA: Diagnosis not present

## 2016-02-05 MED ORDER — TRIAMCINOLONE ACETONIDE 40 MG/ML IJ SUSP
INTRAMUSCULAR | Status: AC
  Administered 2016-02-05: 09:00:00
  Filled 2016-02-05: qty 1

## 2016-02-05 MED ORDER — LIDOCAINE HCL (PF) 1 % IJ SOLN
INTRAMUSCULAR | Status: AC
Start: 2016-02-05 — End: 2016-02-05
  Administered 2016-02-05: 09:00:00
  Filled 2016-02-05: qty 5

## 2016-02-05 MED ORDER — TRIAMCINOLONE ACETONIDE 40 MG/ML IJ SUSP: 40.0000 mg | Freq: Once | INTRAMUSCULAR | Status: DC

## 2016-02-05 MED ORDER — ROPIVACAINE HCL 2 MG/ML IJ SOLN: 2.0000 mL | Freq: Once | INTRAMUSCULAR | Status: DC

## 2016-02-05 MED ORDER — IOPAMIDOL (ISOVUE-M 200) INJECTION 41%
INTRAMUSCULAR | Status: AC
  Administered 2016-02-05: 09:00:00
  Filled 2016-02-05: qty 10

## 2016-02-05 MED ORDER — IOPAMIDOL (ISOVUE-M 200) INJECTION 41%: 10.0000 mL | Freq: Once | INTRAMUSCULAR | Status: DC | PRN

## 2016-02-05 MED ORDER — ROPIVACAINE HCL 2 MG/ML IJ SOLN
INTRAMUSCULAR | Status: AC
Start: 2016-02-05 — End: 2016-02-05
  Administered 2016-02-05: 09:00:00
  Filled 2016-02-05: qty 10

## 2016-02-05 MED ORDER — LIDOCAINE HCL (PF) 1 % IJ SOLN: 10.0000 mL | Freq: Once | INTRAMUSCULAR | Status: DC

## 2016-02-05 MED ORDER — SODIUM CHLORIDE 0.9 % IJ SOLN
INTRAMUSCULAR | Status: AC
Start: 2016-02-05 — End: 2016-02-05
  Administered 2016-02-05: 09:00:00
  Filled 2016-02-05: qty 10

## 2016-02-05 MED ORDER — SODIUM CHLORIDE 0.9% FLUSH: 2.0000 mL | Freq: Once | INTRAVENOUS | Status: DC

## 2016-02-05 NOTE — Progress Notes (Signed)
Safety precautions to be maintained throughout the outpatient stay will include: orient to surroundings, keep bed in low position, maintain call bell within reach at all times, provide assistance with transfer out of bed and ambulation.  

## 2016-02-05 NOTE — Patient Instructions (Signed)
Epidural Steroid Injection Patient Information  Description: The epidural space surrounds the nerves as they exit the spinal cord.  In some patients, the nerves can be compressed and inflamed by a bulging disc or a tight spinal canal (spinal stenosis).  By injecting steroids into the epidural space, we can bring irritated nerves into direct contact with a potentially helpful medication.  These steroids act directly on the irritated nerves and can reduce swelling and inflammation which often leads to decreased pain.  Epidural steroids may be injected anywhere along the spine and from the neck to the low back depending upon the location of your pain.   After numbing the skin with local anesthetic (like Novocaine), a small needle is passed into the epidural space slowly.  You may experience a sensation of pressure while this is being done.  The entire block usually last less than 10 minutes.  Conditions which may be treated by epidural steroids:   Low back and leg pain  Neck and arm pain  Spinal stenosis  Post-laminectomy syndrome  Herpes zoster (shingles) pain  Pain from compression fractures  Preparation for the injection:  1. Do not eat any solid food or dairy products within 8 hours of your appointment.  2. You may drink clear liquids up to 3 hours before appointment.  Clear liquids include water, black coffee, juice or soda.  No milk or cream please. 3. You may take your regular medication, including pain medications, with a sip of water before your appointment  Diabetics should hold regular insulin (if taken separately) and take 1/2 normal NPH dos the morning of the procedure.  Carry some sugar containing items with you to your appointment. 4. A driver must accompany you and be prepared to drive you home after your procedure.  5. Bring all your current medications with your. 6. An IV may be inserted and sedation may be given at the discretion of the physician.   7. A blood pressure  cuff, EKG and other monitors will often be applied during the procedure.  Some patients may need to have extra oxygen administered for a short period. 8. You will be asked to provide medical information, including your allergies, prior to the procedure.  We must know immediately if you are taking blood thinners (like Coumadin/Warfarin)  Or if you are allergic to IV iodine contrast (dye). We must know if you could possible be pregnant.  Possible side-effects:  Bleeding from needle site  Infection (rare, may require surgery)  Nerve injury (rare)  Numbness & tingling (temporary)  Difficulty urinating (rare, temporary)  Spinal headache ( a headache worse with upright posture)  Light -headedness (temporary)  Pain at injection site (several days)  Decreased blood pressure (temporary)  Weakness in arm/leg (temporary)  Pressure sensation in back/neck (temporary)  Call if you experience:  Fever/chills associated with headache or increased back/neck pain.  Headache worsened by an upright position.  New onset weakness or numbness of an extremity below the injection site  Hives or difficulty breathing (go to the emergency room)  Inflammation or drainage at the infection site  Severe back/neck pain  Any new symptoms which are concerning to you  Please note:  Although the local anesthetic injected can often make your back or neck feel good for several hours after the injection, the pain will likely return.  It takes 3-7 days for steroids to work in the epidural space.  You may not notice any pain relief for at least that one week.    If effective, we will often do a series of three injections spaced 3-6 weeks apart to maximally decrease your pain.  After the initial series, we generally will wait several months before considering a repeat injection of the same type.  If you have any questions, please call [phone] [facility]  Discharge Instructions  General Discharge Instructions :  If you need to reach your doctor call: Monday-Friday 8:00 am - 4:00 pm at [phone] or toll free [phone].  After clinic hours [phone] to have operator reach doctor.  Bring all of your medication bottles to all your appointments in the pain clinic.  To cancel or reschedule your appointment with Pain Management please remember to call 24 hours in advance to avoid a fee.  Refer to the educational materials which you have been given on: General Risks, I had my Procedure. Discharge Instructions, Post Sedation.  Post Procedure Instructions:  The drugs you were given will stay in your system until tomorrow, so for the next 24 hours you should not drive, make any legal decisions or drink any alcoholic beverages.  You may eat anything you prefer, but it is better to start with liquids then soups and crackers, and gradually work up to solid foods.  Please notify your doctor immediately if you have any unusual bleeding, trouble breathing or pain that is not related to your normal pain.  Depending on the type of procedure that was done, some parts of your body may feel week and/or numb.  This usually clears up by tonight or the next day.  Walk with the use of an assistive device or accompanied by an adult for the 24 hours.  You may use ice on the affected area for the first 24 hours.  Put ice in a Ziploc bag and cover with a towel and place against area 15 minutes on 15 minutes off.  You may switch to heat after 24 hours. 

## 2016-02-05 NOTE — Progress Notes (Signed)
Patient's Name: Cynthia Bennett MRN: [id number] DOB: [birth year] DOS: 02/05/2016  Primary Reason(s) for Visit: Interventional Pain Management Treatment. CC: Back Pain   Pre-Procedure Assessment:  Cynthia Bennett is a 82 y.o. year old, female patient, seen today for interventional treatment. She has Fibromyalgia; Adiposity; Current tobacco use; Bilateral low back pain with left-sided sciatica; Chronic pain syndrome; Lumbar radicular pain (Bilateral) (R>L); Encounter for therapeutic drug level monitoring; Long term current use of opiate analgesic; Long term prescription opiate use; Uncomplicated opioid dependence ([place]); Opiate use; Acid reflux; HLD (hyperlipidemia); Cannot sleep; Primary open angle glaucoma; Post menopausal syndrome; Temporary cerebral vascular dysfunction; Chronic low back pain (Location of Primary Source of Pain) (Bilateral) (L>R); Grade 1 spondylolisthesis of L4 over L5; Severe (5-6 mm) L4-5 lumbar spinal stenosis and (8 mm) L3-4 spinal stenosis.; Lumbar discogenic pain syndrome; Lumbar facet syndrome (Location of Primary Source of Pain) (Bilateral) (L>R); Lumbar facet hypertrophy (Bilateral); Chronic sacroiliac joint pain (Left); Chronic hip pain (Left); Peptic ulcer disease; Morbid obesity ([place]); GERD (gastroesophageal reflux disease); Chronic pain; Encounter for chronic pain management; Opiate analgesic contract exists ([facility]); Lumbar spondylosis; Abnormal MRI, lumbar spine (03/20/2000); Cervical spondylosis (Anterolisthesis of C5 over C6); Difficulty voiding; Neurogenic bladder; Chronic lower extremity pain (Location of Secondary source of pain) (Left); Chronic lumbar radicular pain (Location of Secondary source of pain) (Left) (L5 Dermatome); and Osteoarthritis of hip (Left) on her problem list.. Her primarily concern today is the Back Pain   Today's Initial Pain Score: 9/10 Reported level of pain is incompatible with clinical obrservations. This may be  secondary to a possible lack of understanding on how the pain scale works. Pain Type: Chronic pain Pain Location: Back Pain Orientation: Left, Lower Pain Descriptors / Indicators: Sharp Pain Frequency: Constant  Post-procedure Pain Score: 0-No pain  Date of Last Visit: 01/23/16 Service Provided on Last Visit: Evaluation  Verification of the correct person, correct site (including marking of site), and correct procedure were performed and confirmed by the patient.  Today's Vitals   02/05/16 0927 02/05/16 0932 02/05/16 0934 02/05/16 0937  BP: 133/64  114/56 119/70  Pulse: 64 60 57 57  Temp:      Resp: 18 19 16 16   Height:      Weight:      SpO2: 95% 97% 96% 96%  PainSc:    0-No pain  PainLoc:      Calculated BMI: Body mass index is 32 kg/(m^2). Allergies: She is allergic to ibuprofen and nsaids.. Primary Diagnosis: Chronic radicular lumbar pain [M54.16, G89.29]  Procedure:  Type: Therapeutic Inter-Laminar Epidural Steroid Injection Region: Lumbar Level: L5-S1 Level. Laterality: Left Paramedial  Indications: 1. Chronic lumbar radicular pain (Location of Secondary source of pain) (Left) (L5 Dermatome)   2. Severe (5-6 mm) L4-5 lumbar spinal stenosis and (8 mm) L3-4 spinal stenosis.   3. Bilateral low back pain with left-sided sciatica   4. Osteoarthritis of spine with radiculopathy, lumbar region     In addition, Cynthia Bennett has Fibromyalgia; Bilateral low back pain with left-sided sciatica; Chronic pain syndrome; Lumbar radicular pain (Bilateral) (R>L); Chronic low back pain (Location of Primary Source of Pain) (Bilateral) (L>R); Grade 1 spondylolisthesis of L4 over L5; Severe (5-6 mm) L4-5 lumbar spinal stenosis and (8 mm) L3-4 spinal stenosis.; Lumbar discogenic pain syndrome; Lumbar facet syndrome (Location of Primary Source of Pain) (Bilateral) (L>R); Lumbar facet hypertrophy (Bilateral); Chronic sacroiliac joint pain (Left); Chronic hip pain (Left); Chronic pain; Lumbar  spondylosis; Abnormal MRI, lumbar spine (  03/20/2000); Cervical spondylosis (Anterolisthesis of C5 over C6); Chronic lower extremity pain (Location of Secondary source of pain) (Left); Chronic lumbar radicular pain (Location of Secondary source of pain) (Left) (L5 Dermatome); and Osteoarthritis of hip (Left) on her pertinent problem list.  Consent: Secured. Under the influence of no sedatives a written informed consent was obtained, after having provided information on the risks and possible complications. To fulfill our ethical and legal obligations, as recommended by the American Medical Association's Code of Ethics, we have provided information to the patient about our clinical impression; the nature and purpose of the treatment or procedure; the risks, benefits, and possible complications of the intervention; alternatives; the risk(s) and benefit(s) of the alternative treatment(s) or procedure(s); and the risk(s) and benefit(s) of doing nothing. The patient was provided information about the risks and possible complications associated with the procedure. In the case of spinal procedures these may include, but are not limited to, failure to achieve desired goals, infection, bleeding, organ or nerve damage, allergic reactions, paralysis, and death. In addition, the patient was informed that Medicine is not an exact science; therefore, there is also the possibility of unforeseen risks and possible complications that may result in a catastrophic outcome. The patient indicated having understood very clearly. We have given the patient no guarantees and we have made no promises. Enough time was given to the patient to ask questions, all of which were answered to the patient's satisfaction.  Pre-Procedure Preparation: Safety Precautions: Allergies reviewed. Appropriate site, procedure, and patient were confirmed by following the Joint Commission's Universal Protocol (UP.01.01.01), in the form of a "Time Out". The  patient was asked to confirm marked site and procedure, before commencing. The patient was asked about blood thinners, or active infections, both of which were denied. Patient was assessed for positional comfort and all pressure points were checked before starting procedure. Monitoring:  As per clinic protocol. Infection Control Precautions: Sterile technique used. Standard Universal Precautions were taken as recommended by the Department of [facility] for Disease Control and Prevention (CDC). Standard pre-surgical skin prep was conducted. Respiratory hygiene and cough etiquette was practiced. Hand hygiene observed. Safe injection practices and needle disposal techniques followed. SDV (single dose vial) medications used. Medications properly checked for expiration dates and contaminants. Personal protective equipment (PPE) used: Surgical mask. Sterile double glove technique. Radiation resistant gloves. Sterile surgical gloves.  Anesthesia, Analgesia, Anxiolysis: Type: Local Anesthesia Local Anesthetic: Lidocaine 1% Route: Infiltration ([place]/IM) IV Access: Declined Sedation: Declined  Indication(s): Analgesia    Description of Procedure Process:  Time-out: "Time-out" completed before starting procedure, as per protocol. Position: Prone Target Area: For Epidural Steroid injections, the target area is the  interlaminar space, initially targeting the lower border of the superior vertebral body lamina. Approach: Posterior approach. Area Prepped: Entire Posterior Lumbosacral Region Prepping solution: ChloraPrep (2% chlorhexidine gluconate and 70% isopropyl alcohol) Safety Precautions: Aspiration looking for blood return was conducted prior to all injections. At no point did we inject any substances, as a needle was being advanced. No attempts were made at seeking any paresthesias. Safe injection practices and needle disposal techniques used. Medications properly checked for expiration dates.  SDV (single dose vial) medications used.   Description of the Procedure: Protocol guidelines were followed. The patient was placed in position over the fluoroscopy table. The target area was identified and the area prepped in the usual manner. Skin desensitized using vapocoolant spray. Skin & deeper tissues infiltrated with local anesthetic. Appropriate amount of time allowed to  pass for local anesthetics to take effect. The procedure needle was introduced through the skin, ipsilateral to the reported pain, and advanced to the target area. Bone was contacted and the needle walked caudad, until the lamina was cleared. The epidural space was identified using "loss-of-resistance technique" with 2-3 ml of PF-NaCl (0.9% NSS), in a 5cc LOR glass syringe. Proper needle placement secured. Negative aspiration confirmed. Solution injected in intermittent fashion, asking for systemic symptoms every 0.5cc of injectate. The needles were then removed and the area cleansed, making sure to leave some of the prepping solution back to take advantage of its long term bactericidal properties. EBL: None Materials & Medications Used:  Needle(s) Used: 20g - 10cm, Tuohy-style epidural needle Medication(s): Please see chart orders for medication and dosing details.  Imaging Guidance:  Type of Imaging Technique: Fluoroscopy Guidance (Spinal) Indication(s): Assistance in needle guidance and placement for procedures requiring needle placement in or near specific anatomical locations not easily accessible without such assistance. Exposure Time: Please see nurses notes. Contrast: Before injecting any contrast, we confirmed that the patient did not have an allergy to iodine, shellfish, or radiological contrast. Once satisfactory needle placement was completed at the desired level, radiological contrast was injected. Injection was conducted under continuous fluoroscopic guidance. Injection of contrast accomplished without complications.  See chart for type and volume of contrast used. Fluoroscopic Guidance: I was personally present in the fluoroscopy suite, where the patient was placed in position for the procedure, over the fluoroscopy-compatible table. Fluoroscopy was manipulated, using "Tunnel Vision Technique", to obtain the best possible view of the target area, on the affected side. Parallax error was corrected before commencing the procedure. A "direction-depth-direction" technique was used to introduce the needle under continuous pulsed fluoroscopic guidance. Once the target was reached, antero-posterior, oblique, and lateral fluoroscopic projection views were taken to confirm needle placement in all planes. Permanently recorded images stored by scanning into EMR. Interpretation: Intraoperative imaging interpretation by performing Physician. Adequate needle placement confirmed in AP, lateral, & Oblique Views. Appropriate spread of contrast to desired area. No evidence of afferent or efferent intravascular uptake. No intrathecal or subarachnoid spread observed. Permanent hardcopy images in multiple planes scanned into the patient's record.  Antibiotic Prophylaxis:  Indication(s): No indications identified. Type:  Antibiotics Given (last 72 hours)    None       Post-operative Assessment:  Complications: No immediate post-treatment complications were observed. Relevant Post-operative Information:  Disposition: Return to clinic for follow-up evaluation. The patient tolerated the entire procedure well. A repeat set of vitals were taken after the procedure and the patient was kept under observation following institutional policy, for this procedure. Post-procedural neurological assessment was performed, showing return to baseline, prior to discharge. The patient was discharged home, once practice criteria were met. The patient was provided with post-procedure discharge instructions, including a section on how to identify potential  problems. Should any problems arise concerning this procedure, the patient was given instructions to immediately contact us, at any time, without hesitation. In any case, we plan to contact the patient by telephone for a follow-up status report regarding this interventional procedure. Comments:  No additional relevant information.  Medications administered during this visit: We administered ropivacaine (PF) 2 mg/ml (0.2%), iopamidol, lidocaine (PF), sodium chloride, and triamcinolone acetonide.  Prescriptions ordered during this visit: New Prescriptions   No medications on file    Future Appointments Date Time Provider [place]  02/18/2016 8:00 AM Milinda Pointer, MD [facility] None  04/23/2016 10:40 AM Milinda Pointer,  MD [facility] None    Primary Care Physician: [name], MD Location: [facility] Note by: Kathlen Brunswick. Dossie Arbour, M.D, DABA, DABAPM, DABPM, DABIPP, FIPP  Disclaimer:  Medicine is not an exact science. The only guarantee in medicine is that nothing is guaranteed. It is important to note that the decision to proceed with this intervention was based on the information collected from the patient. The Data and conclusions were drawn from the patient's questionnaire, the interview, and the physical examination. Because the information was provided in large part by the patient, it cannot be guaranteed that it has not been purposely or unconsciously manipulated. Every effort has been made to obtain as much relevant data as possible for this evaluation. It is important to note that the conclusions that lead to this procedure are derived in large part from the available data. Always take into account that the treatment will also be dependent on availability of resources and existing treatment guidelines, considered by other Pain Management Practitioners as being common knowledge and practice, at the time of the intervention. For Medico-Legal  purposes, it is also important to point out that variation in procedural techniques and pharmacological choices are the acceptable norm. The indications, contraindications, technique, and results of the above procedure should only be interpreted and judged by a Board-Certified Interventional Pain Specialist with extensive familiarity and expertise in the same exact procedure and technique. Attempts at providing opinions without similar or greater experience and expertise than that of the treating physician will be considered as inappropriate and unethical, and shall result in a formal complaint to the state medical board and applicable specialty societies.

## 2016-02-06 ENCOUNTER — Telehealth: Payer: Self-pay

## 2016-02-06 NOTE — Telephone Encounter (Signed)
Post procedure phone call.  States she is doing OK.   

## 2016-02-18 ENCOUNTER — Ambulatory Visit: Payer: PPO | Attending: Pain Medicine | Admitting: Pain Medicine

## 2016-02-18 ENCOUNTER — Encounter: Payer: Self-pay | Admitting: Pain Medicine

## 2016-02-18 VITALS — BP 110/64 | HR 78 | Temp 98.9°F | Resp 18 | Ht 62.0 in | Wt 175.0 lb

## 2016-02-18 DIAGNOSIS — K219 Gastro-esophageal reflux disease without esophagitis: Secondary | ICD-10-CM | POA: Insufficient documentation

## 2016-02-18 DIAGNOSIS — M1612 Unilateral primary osteoarthritis, left hip: Secondary | ICD-10-CM | POA: Insufficient documentation

## 2016-02-18 DIAGNOSIS — M5442 Lumbago with sciatica, left side: Secondary | ICD-10-CM | POA: Diagnosis not present

## 2016-02-18 DIAGNOSIS — H401190 Primary open-angle glaucoma, unspecified eye, stage unspecified: Secondary | ICD-10-CM | POA: Insufficient documentation

## 2016-02-18 DIAGNOSIS — H409 Unspecified glaucoma: Secondary | ICD-10-CM | POA: Insufficient documentation

## 2016-02-18 DIAGNOSIS — G8929 Other chronic pain: Secondary | ICD-10-CM | POA: Diagnosis not present

## 2016-02-18 DIAGNOSIS — Z87891 Personal history of nicotine dependence: Secondary | ICD-10-CM | POA: Diagnosis not present

## 2016-02-18 DIAGNOSIS — M5416 Radiculopathy, lumbar region: Secondary | ICD-10-CM | POA: Insufficient documentation

## 2016-02-18 DIAGNOSIS — E785 Hyperlipidemia, unspecified: Secondary | ICD-10-CM | POA: Insufficient documentation

## 2016-02-18 DIAGNOSIS — M4316 Spondylolisthesis, lumbar region: Secondary | ICD-10-CM | POA: Diagnosis not present

## 2016-02-18 DIAGNOSIS — N319 Neuromuscular dysfunction of bladder, unspecified: Secondary | ICD-10-CM | POA: Diagnosis not present

## 2016-02-18 DIAGNOSIS — M47812 Spondylosis without myelopathy or radiculopathy, cervical region: Secondary | ICD-10-CM | POA: Diagnosis not present

## 2016-02-18 DIAGNOSIS — M79605 Pain in left leg: Secondary | ICD-10-CM

## 2016-02-18 DIAGNOSIS — M47816 Spondylosis without myelopathy or radiculopathy, lumbar region: Secondary | ICD-10-CM | POA: Insufficient documentation

## 2016-02-18 DIAGNOSIS — M545 Low back pain: Secondary | ICD-10-CM | POA: Insufficient documentation

## 2016-02-18 DIAGNOSIS — Z8673 Personal history of transient ischemic attack (TIA), and cerebral infarction without residual deficits: Secondary | ICD-10-CM | POA: Insufficient documentation

## 2016-02-18 DIAGNOSIS — M549 Dorsalgia, unspecified: Secondary | ICD-10-CM | POA: Diagnosis not present

## 2016-02-18 DIAGNOSIS — M4806 Spinal stenosis, lumbar region: Secondary | ICD-10-CM | POA: Insufficient documentation

## 2016-02-18 DIAGNOSIS — M797 Fibromyalgia: Secondary | ICD-10-CM | POA: Insufficient documentation

## 2016-02-18 DIAGNOSIS — Z79891 Long term (current) use of opiate analgesic: Secondary | ICD-10-CM | POA: Diagnosis not present

## 2016-02-18 DIAGNOSIS — M25552 Pain in left hip: Secondary | ICD-10-CM | POA: Insufficient documentation

## 2016-02-18 DIAGNOSIS — M533 Sacrococcygeal disorders, not elsewhere classified: Secondary | ICD-10-CM | POA: Diagnosis not present

## 2016-02-18 NOTE — Assessment & Plan Note (Signed)
Pain seems to be completely gone after a left-sided L5-S1 lumbar epidural steroid injection under fluoroscopic guidance and no sedation.

## 2016-02-18 NOTE — Progress Notes (Signed)
Safety precautions to be maintained throughout the outpatient stay will include: orient to surroundings, keep bed in low position, maintain call bell within reach at all times, provide assistance with transfer out of bed and ambulation.  

## 2016-02-18 NOTE — Progress Notes (Signed)
Patient's Name: Cynthia Bennett  Patient type: Established  MRN: [id number]  Service setting: Ambulatory outpatient  DOB: December 15, 1932  Location: [facility]  DOS: 02/18/2016  Primary Care Physician: Jerl Mina, MD  Note by: Sydnee Levans. Laban Emperor, M.D, DABA, DABAPM, DABPM, DABIPP, FIPP  Referring Physician: Jerl Mina, MD  Specialty: Board-Certified Interventional Pain Management     Primary Reason(s) for Visit: Encounter for post-procedure evaluation of chronic illness with mild to moderate exacerbation CC: Back Pain   HPI  Cynthia Bennett is an 82 y.o. year old, female patient, who returns today as an established patient. Cynthia Bennett has Fibromyalgia; Adiposity; Current tobacco use; Bilateral low back pain with left-sided sciatica; Chronic pain syndrome; Lumbar radicular pain (Bilateral) (R>L); Encounter for therapeutic drug level monitoring; Long term current use of opiate analgesic; Long term prescription opiate use; Uncomplicated opioid dependence (HCC); Opiate use; Acid reflux; HLD (hyperlipidemia); Cannot sleep; Primary open angle glaucoma; Post menopausal syndrome; Temporary cerebral vascular dysfunction; Chronic low back pain (Location of Primary Source of Pain) (Bilateral) (L>R); Grade 1 spondylolisthesis of L4 over L5; Severe (5-6 mm) L4-5 lumbar spinal stenosis and (8 mm) L3-4 spinal stenosis.; Lumbar discogenic pain syndrome; Lumbar facet syndrome (Location of Primary Source of Pain) (Bilateral) (L>R); Lumbar facet hypertrophy (Bilateral); Chronic sacroiliac joint pain (Left); Chronic hip pain (Left); Peptic ulcer disease; Morbid obesity (HCC); GERD (gastroesophageal reflux disease); Chronic pain; Encounter for chronic pain management; Opiate analgesic contract exists [employer] [facility]); Lumbar spondylosis; Abnormal MRI, lumbar spine (03/20/2000); Cervical spondylosis (Anterolisthesis of C5 over C6); Difficulty voiding; Neurogenic bladder; Chronic lower  extremity pain (Location of Secondary source of pain) (Left); Chronic lumbar radicular pain (Location of Secondary source of pain) (Left) (L5 Dermatome); and Osteoarthritis of hip (Left) on her problem list.. Her primarily concern today is the Back Pain   Pain Assessment: Self-Reported Pain Score: 0-No pain Reported level is compatible with observation Pain Type: Chronic pain Pain Location: Back Pain Orientation: Left, Lower Pain Descriptors / Indicators:  (no pain today) Pain Frequency:  (no pain today)  The patient comes in today clinics today after having had a left-sided L5-S1 lumbar epidural steroid injection under fluoroscopic guidance, without IV sedation on 02/05/2016. Cynthia Bennett indicates 100% relief of the pain.  Date of Last Visit: 02/05/16 Service Provided on Last Visit: Procedure (lesi)  Post-Procedure Assessment  Procedure done on last visit: Left sided L5-S1 lumbar epidural steroid injection under fluoroscopic guidance, without IV sedation. Side-effects or Adverse reactions: None reported Sedation: No sedation used  Results: Ultra-Short Term Relief (First 1 hour after procedure): 100 %  Analgesia during this period is likely to be Local Anesthetic and/or IV Sedative (Analgesic/Anxiolitic) related Short Term Relief (Initial 4-6 hrs after procedure): 100 % Complete relief confirms area to be the source of pain Long Term Relief : 100 % Long-term benefit would suggest an inflammatory etiology to the pain   Current Relief (Now): 100%  Persistent relief would suggest effective anti-inflammatory effects from steroids Interpretation of Results: Clearly this is a good palliative alternative treating this pain.  Laboratory Chemistry  Inflammation Markers No results found for: ESRSEDRATE, CRP  Renal Function Lab Results  Component Value Date   BUN 21* 04/28/2014   CREATININE 0.89 04/28/2014   GFRAA >60 04/28/2014   GFRNONAA >60 04/28/2014    Hepatic Function Lab Results   Component Value Date   AST 22 04/27/2014   ALT 12 04/27/2014   ALBUMIN 3.2* 04/27/2014    Electrolytes Lab Results  Component Value Date   NA 143 04/28/2014   K 4.0 04/28/2014   CL 115* 04/28/2014   CALCIUM 8.0* 04/28/2014   MG 1.7* 12/31/2011    Pain Modulating Vitamins No results found for: VD25OH, VD125OH2TOT, EA5409WJ1, BJ4782NF6, VITAMINB12  Coagulation Parameters Lab Results  Component Value Date   INR 1.0 12/30/2011   LABPROT 13.9 12/30/2011    Note: I personally reviewed the above data. Results shared with patient.  Meds  The patient has a current medication list which includes the following prescription(s): bupropion, bupropion, dorzolamide-timolol, doxepin, doxepin, hydrocodone-acetaminophen, hydrocodone-acetaminophen, hydrocodone-acetaminophen, pantoprazole, pantoprazole, sertraline, sertraline, zolpidem, zolpidem, and bupropion.  Current Outpatient Prescriptions on File Prior to Visit  Medication Sig  . buPROPion (WELLBUTRIN SR) 150 MG 12 hr tablet Take by mouth. Reported on 02/05/2016  . buPROPion (WELLBUTRIN SR) 150 MG 12 hr tablet Reported on 02/05/2016  . dorzolamide-timolol (COSOPT) 22.3-6.8 MG/ML ophthalmic solution INT 1 GTT IN OU BID  . doxepin (SINEQUAN) 100 MG capsule Take 100 mg by mouth at bedtime.  Marland Kitchen doxepin (SINEQUAN) 100 MG capsule Take by mouth. Reported on 02/05/2016  . HYDROcodone-acetaminophen (NORCO/VICODIN) 5-325 MG tablet Take 1 tablet by mouth every 12 (twelve) hours as needed for moderate pain or severe pain.  Marland Kitchen HYDROcodone-acetaminophen (NORCO/VICODIN) 5-325 MG tablet Take 1 tablet by mouth every 12 (twelve) hours as needed for moderate pain or severe pain.  Marland Kitchen HYDROcodone-acetaminophen (NORCO/VICODIN) 5-325 MG tablet Take 1 tablet by mouth every 12 (twelve) hours as needed for moderate pain or severe pain.  . pantoprazole (PROTONIX) 40 MG tablet Reported on 02/05/2016  . pantoprazole (PROTONIX) 40 MG tablet Take 40 mg by mouth daily.   .  sertraline (ZOLOFT) 100 MG tablet Take 100 mg by mouth daily.  . sertraline (ZOLOFT) 100 MG tablet Take by mouth. Reported on 02/05/2016  . zolpidem (AMBIEN) 10 MG tablet Take 10 mg by mouth at bedtime.   Marland Kitchen zolpidem (AMBIEN) 10 MG tablet Reported on 02/05/2016  . buPROPion (WELLBUTRIN XL) 150 MG 24 hr tablet Take 150 mg by mouth daily.   No current facility-administered medications on file prior to visit.    ROS  Constitutional: Afebrile, no chills, well hydrated and well nourished Gastrointestinal: No upper or lower GI bleeding, no nausea, no vomiting and no acute GI distress Musculoskeletal: No acute joint swelling or redness, no acute loss of range of motion and no acute onset weakness Neurological: Denies any acute onset apraxia, no episodes of paralysis, no acute loss of coordination, no acute loss of consciousness and no acute onset aphasia, dysarthria, agnosia, or amnesia  Allergies  Cynthia Bennett is allergic to ibuprofen and nsaids.  PFSH  Medical:  Cynthia Bennett  has a past medical history of TIA (transient ischemic attack); Spinal stenosis; Osteoarthritis; Fibromyalgia; Glaucoma; Peptic ulcer disease; Stroke (HCC); Reflux; Hiatal hernia; and Osteoarthritis of spine with radiculopathy, lumbar region (08/14/2015). Family: family history includes COPD in her father; Mental illness in her mother. Surgical:  has past surgical history that includes Esophagogastroduodenoscopy (N/A, 04/16/2015); Appendectomy; and Abdominal hysterectomy. Tobacco:  reports that Cynthia Bennett has quit smoking. Cynthia Bennett does not have any smokeless tobacco history on file. Alcohol:  reports that Cynthia Bennett does not drink alcohol. Drug:  reports that Cynthia Bennett does not use illicit drugs.  Physical Examination  Constitutional Vitals:  Today's Vitals   02/18/16 0819 02/18/16 0823  BP: 110/64   Pulse: 78   Temp: 98.9 F (37.2 C)   TempSrc: Oral   Resp: 18   Height:  (  1.575 m)   Weight: 175 lb (79.379 kg)   SpO2: 97%   PainSc: 0-No pain  0-No pain  PainLoc: Back     Calculated BMI: Body mass index is 32 kg/(m^2). Obese (Class I) (30-34.9 kg/m2) - 68% higher incidence of chronic pain General appearance: alert, cooperative, appears stated age, no distress and morbidly obese Eyes: PERLA Respiratory: No evidence respiratory distress, no audible rales or ronchi and no use of accessory muscles of respiration  Cervical Spine Exam  Inspection: Normal anatomy, no anomalies observed Cervical Lordosis: Normal Alignment: Symetrical Functional ROM: Within functional limits (WFL) AROM: WFL Sensory: No sensory abnormalities reported  Upper Extremity Exam    Right  Left  Inspection: No gross anomalies detected  Inspection: No gross anomalies detected  Functional ROM: Adequate  Functional ROM: Adequate  AROM: Adequate  AROM: Adequate  Sensory: Normal  No sensory abnormalities reported  Sensory: Normal  No sensory abnormalities reported  Motor: Unremarkable  Motor: Unremarkable  Vascular: Normal skin color, temperature, and hair growth. No peripheral edema or cyanosis  Vascular: Normal skin color, temperature, and hair growth. No peripheral edema or cyanosis   Thoracic Spine  Inspection: No gross anomalies detected Alignment: Symetrical Functional ROM: Within functional limits [facility](WFL) AROM: Adequate Palpation: WNL  Lumbar Spine  Inspection: No gross anomalies detected Alignment: Symetrical Functional ROM: Within functional limits (WFL) AROM: Decreased Palpation: WNL Provocative Tests: Lumbar Hyperextension and rotation test: deferred Patrick's Maneuver: deferred  Gait Assessment  Gait: Antalgic (limping)  Lower Extremities    Right  Left  Inspection: No gross anomalies detected  Inspection: No gross anomalies detected  Functional ROM: Within functional limits [facility](WFL)  Functional ROM: Within functional limits (WFL)  AROM: Adequate  AROM: Adequate  Sensory: Normal  Sensory: Normal  Motor: Unremarkable  Motor: Unremarkable    Assessment & Plan  Primary Diagnosis & Pertinent Problem List: The primary encounter diagnosis was Chronic low back pain (Location of Primary Source of Pain) (Bilateral) (L>R). Diagnoses of Chronic lower extremity pain (Location of Secondary source of pain) (Left) and Chronic lumbar radicular pain (Location of Secondary source of pain) (Left) (L5 Dermatome) were also pertinent to this visit.  Visit Diagnosis: 1. Chronic low back pain (Location of Primary Source of Pain) (Bilateral) (L>R)   2. Chronic lower extremity pain (Location of Secondary source of pain) (Left)   3. Chronic lumbar radicular pain (Location of Secondary source of pain) (Left) (L5 Dermatome)     Problem-specific Plan(s): Chronic lumbar radicular pain (Location of Secondary source of pain) (Left) (L5 Dermatome) Pain seems to be completely gone after a left-sided L5-S1 lumbar epidural steroid injection under fluoroscopic guidance and no sedation.    Plan of Care   Problem List Items Addressed This Visit      High   Chronic lumbar radicular pain (Location of Secondary source of pain) (Left) (L5 Dermatome) (Chronic)    Pain seems to be completely gone after a left-sided L5-S1 lumbar epidural steroid injection under fluoroscopic guidance and no sedation.      Relevant Orders   LUMBAR EPIDURAL STEROID INJECTION   Chronic lower extremity pain (Location of Secondary source of pain) (Left) (Chronic)   Chronic low back pain (Location of Primary Source of Pain) (Bilateral) (L>R) - Primary (Chronic)       Pharmacotherapy (Medications Ordered): No orders of the defined types were placed in this encounter.    Lab-work & Procedure Ordered: Orders Placed This Encounter  Procedures  . LUMBAR EPIDURAL STEROID INJECTION  Standing Status: Standing     Number of Occurrences: 1     Standing Expiration Date: 02/17/2017    Scheduling Instructions:     Side: Left-sided (L5-S1)     Sedation: No Sedation.     Timeframe: PRN  Procedure. Patient will call to schedule.    Order Specific Question:  Where will this procedure be performed?    Answer:  [facility]    Imaging Ordered: None  Interventional Therapies: Scheduled:  None at this point.    Considering:  None at this point.    PRN Procedures:  Palliative left-sided L5-S1 lumbar epidural steroid injection under fluoroscopic guidance, without IV sedation.    Referral(s) or Consult(s): None at this time.  Medications administered during this visit: Cynthia Bennett had no medications administered during this visit.  Future Appointments Date Time Provider Department Center  04/23/2016 10:40 AM Delano Metz, MD [facility] None    Primary Care Physician: Jerl Mina, MD Location: [facility] Note by: Sydnee Levans. Laban Emperor, M.D, DABA, DABAPM, DABPM, DABIPP, FIPP  Pain Score Disclaimer: We use the NRS-11 scale. This is a self-reported, subjective measurement of pain severity with only modest accuracy. It is used primarily to identify changes within a particular patient. It must be understood that outpatient pain scales are significantly less accurate that those used for research, where they can be applied under ideal controlled circumstances with minimal exposure to variables. In reality, the score is likely to be a combination of pain intensity and pain affect, where pain affect describes the degree of emotional arousal or changes in action readiness caused by the sensory experience of pain. Factors such as social and work situation, setting, emotional state, anxiety levels, expectation, and prior pain experience may influence pain perception and show large inter-individual differences that may also be affected by time variables.

## 2016-03-04 DIAGNOSIS — N390 Urinary tract infection, site not specified: Secondary | ICD-10-CM | POA: Diagnosis not present

## 2016-03-28 DIAGNOSIS — H401123 Primary open-angle glaucoma, left eye, severe stage: Secondary | ICD-10-CM | POA: Diagnosis not present

## 2016-04-17 ENCOUNTER — Ambulatory Visit: Payer: PPO | Admitting: Pain Medicine

## 2016-04-23 ENCOUNTER — Encounter: Payer: PPO | Admitting: Pain Medicine

## 2016-04-29 ENCOUNTER — Encounter: Payer: Self-pay | Admitting: Pain Medicine

## 2016-04-29 ENCOUNTER — Ambulatory Visit: Payer: PPO | Attending: Pain Medicine | Admitting: Pain Medicine

## 2016-04-29 ENCOUNTER — Other Ambulatory Visit: Payer: Self-pay | Admitting: Pain Medicine

## 2016-04-29 VITALS — BP 155/76 | HR 64 | Temp 98.4°F | Resp 16 | Ht 63.0 in | Wt 175.0 lb

## 2016-04-29 DIAGNOSIS — M533 Sacrococcygeal disorders, not elsewhere classified: Secondary | ICD-10-CM | POA: Diagnosis not present

## 2016-04-29 DIAGNOSIS — M25552 Pain in left hip: Secondary | ICD-10-CM

## 2016-04-29 DIAGNOSIS — M4316 Spondylolisthesis, lumbar region: Secondary | ICD-10-CM | POA: Diagnosis not present

## 2016-04-29 DIAGNOSIS — K219 Gastro-esophageal reflux disease without esophagitis: Secondary | ICD-10-CM | POA: Diagnosis not present

## 2016-04-29 DIAGNOSIS — M1612 Unilateral primary osteoarthritis, left hip: Secondary | ICD-10-CM

## 2016-04-29 DIAGNOSIS — H401190 Primary open-angle glaucoma, unspecified eye, stage unspecified: Secondary | ICD-10-CM | POA: Diagnosis not present

## 2016-04-29 DIAGNOSIS — M48061 Spinal stenosis, lumbar region without neurogenic claudication: Secondary | ICD-10-CM

## 2016-04-29 DIAGNOSIS — M5442 Lumbago with sciatica, left side: Secondary | ICD-10-CM | POA: Diagnosis not present

## 2016-04-29 DIAGNOSIS — M4806 Spinal stenosis, lumbar region: Secondary | ICD-10-CM | POA: Insufficient documentation

## 2016-04-29 DIAGNOSIS — M545 Low back pain: Secondary | ICD-10-CM | POA: Insufficient documentation

## 2016-04-29 DIAGNOSIS — G8929 Other chronic pain: Secondary | ICD-10-CM

## 2016-04-29 DIAGNOSIS — F17210 Nicotine dependence, cigarettes, uncomplicated: Secondary | ICD-10-CM | POA: Diagnosis not present

## 2016-04-29 DIAGNOSIS — M5416 Radiculopathy, lumbar region: Secondary | ICD-10-CM | POA: Diagnosis not present

## 2016-04-29 DIAGNOSIS — K279 Peptic ulcer, site unspecified, unspecified as acute or chronic, without hemorrhage or perforation: Secondary | ICD-10-CM | POA: Diagnosis not present

## 2016-04-29 DIAGNOSIS — E785 Hyperlipidemia, unspecified: Secondary | ICD-10-CM | POA: Insufficient documentation

## 2016-04-29 DIAGNOSIS — Z79891 Long term (current) use of opiate analgesic: Secondary | ICD-10-CM | POA: Insufficient documentation

## 2016-04-29 DIAGNOSIS — M549 Dorsalgia, unspecified: Secondary | ICD-10-CM | POA: Diagnosis not present

## 2016-04-29 DIAGNOSIS — M797 Fibromyalgia: Secondary | ICD-10-CM | POA: Diagnosis not present

## 2016-04-29 DIAGNOSIS — M47816 Spondylosis without myelopathy or radiculopathy, lumbar region: Secondary | ICD-10-CM

## 2016-04-29 MED ORDER — TRIAMCINOLONE ACETONIDE 40 MG/ML IJ SUSP
INTRAMUSCULAR | Status: AC
  Administered 2016-04-29: 15:00:00
  Filled 2016-04-29: qty 1

## 2016-04-29 MED ORDER — LIDOCAINE HCL (PF) 1 % IJ SOLN: 10.0000 mL | Freq: Once | INTRAMUSCULAR | Status: DC

## 2016-04-29 MED ORDER — SODIUM CHLORIDE 0.9 % IJ SOLN
INTRAMUSCULAR | Status: AC
  Administered 2016-04-29: 15:00:00
  Filled 2016-04-29: qty 10

## 2016-04-29 MED ORDER — ROPIVACAINE HCL 2 MG/ML IJ SOLN: 2.0000 mL | Freq: Once | INTRAMUSCULAR | Status: DC

## 2016-04-29 MED ORDER — IOPAMIDOL (ISOVUE-M 200) INJECTION 41%
10.0000 mL | Freq: Once | INTRAMUSCULAR | Status: DC
  Filled 2016-04-29: qty 10

## 2016-04-29 MED ORDER — ROPIVACAINE HCL 2 MG/ML IJ SOLN
INTRAMUSCULAR | Status: AC
  Administered 2016-04-29: 15:00:00
  Filled 2016-04-29: qty 10

## 2016-04-29 MED ORDER — HYDROCODONE-ACETAMINOPHEN 5-325 MG PO TABS: 1.0000 | ORAL_TABLET | Freq: Two times a day (BID) | ORAL | Status: DC | PRN

## 2016-04-29 MED ORDER — TRIAMCINOLONE ACETONIDE 40 MG/ML IJ SUSP: 40.0000 mg | Freq: Once | INTRAMUSCULAR | Status: DC

## 2016-04-29 MED ORDER — SODIUM CHLORIDE 0.9% FLUSH: 2.0000 mL | Freq: Once | INTRAVENOUS | Status: DC

## 2016-04-29 MED ORDER — LIDOCAINE HCL (PF) 1 % IJ SOLN
INTRAMUSCULAR | Status: AC
  Administered 2016-04-29: 15:00:00
  Filled 2016-04-29: qty 5

## 2016-04-29 NOTE — Patient Instructions (Addendum)
Pain Management Discharge Instructions  General Discharge Instructions :  If you need to reach your doctor call: Monday-Friday 8:00 am - 4:00 pm at [phone] or toll free [phone].  After clinic hours [phone] to have operator reach doctor.  Bring all of your medication bottles to all your appointments in the pain clinic.  To cancel or reschedule your appointment with Pain Management please remember to call 24 hours in advance to avoid a fee.  Refer to the educational materials which you have been given on: General Risks, I had my Procedure. Discharge Instructions, Post Sedation.  Post Procedure Instructions:  The drugs you were given will stay in your system until tomorrow, so for the next 24 hours you should not drive, make any legal decisions or drink any alcoholic beverages.  You may eat anything you prefer, but it is better to start with liquids then soups and crackers, and gradually work up to solid foods.  Please notify your doctor immediately if you have any unusual bleeding, trouble breathing or pain that is not related to your normal pain.  Depending on the type of procedure that was done, some parts of your body may feel week and/or numb.  This usually clears up by tonight or the next day.  Walk with the use of an assistive device or accompanied by an adult for the 24 hours.  You may use ice on the affected area for the first 24 hours.  Put ice in a Ziploc bag and cover with a towel and place against area 15 minutes on 15 minutes off.  You may switch to heat after 24 hours.Epidural Steroid Injection An epidural steroid injection is given to relieve pain in your neck, back, or legs that is caused by the irritation or swelling of a nerve root. This procedure involves injecting a steroid and numbing medicine (anesthetic) into the epidural space. The epidural space is the space between the outer covering of your spinal cord and the bones that form your backbone  (vertebra).  [facility] KNOW ABOUT:  1. Any allergies you have. 2. All medicines you are taking, including vitamins, herbs, eye drops, creams, and over-the-counter medicines such as aspirin. 3. Previous problems you or members of your family have had with the use of anesthetics. 4. Any blood disorders or blood clotting disorders you have. 5. Previous surgeries you have had. 6. Medical conditions you have. RISKS AND COMPLICATIONS Generally, this is a safe procedure. However, as with any procedure, complications can occur. Possible complications of epidural steroid injection include:  Headache.  Bleeding.  Infection.  Allergic reaction to the medicines.  Damage to your nerves. The response to this procedure depends on the underlying cause of the pain and its duration. People who have long-term (chronic) pain are less likely to benefit from epidural steroids than are those people whose pain comes on strong and suddenly. BEFORE THE PROCEDURE   Ask your health care provider about changing or stopping your regular medicines. You may be advised to stop taking blood-thinning medicines a few days before the procedure.  You may be given medicines to reduce anxiety.  Arrange for someone to take you home after the procedure. PROCEDURE   You will remain awake during the procedure. You may receive medicine to make you relaxed.  You will be asked to lie on your stomach.  The injection site will be cleaned.  The injection site will be numbed with a medicine (local anesthetic).  A needle will be  injected through your skin into the epidural space.  Your health care provider will use an X-ray machine to ensure that the steroid is delivered closest to the affected nerve. You may have minimal discomfort at this time.  Once the needle is in the right position, the local anesthetic and the steroid will be injected into the epidural space.  The needle will then be removed and a  bandage will be applied to the injection site. AFTER THE PROCEDURE  1. You may be monitored for a short time before you go home. 2. You may feel weakness or numbness in your arm or leg, which disappears within hours. 3. You may be allowed to eat, drink, and take your regular medicine. 4. You may have soreness at the site of the injection.   This information is not intended to replace advice given to you by your health care provider. Make sure you discuss any questions you have with your health care provider.   Document Released: 02/03/2008 Document Revised: 06/29/2013 Document Reviewed: 04/15/2013 Else[employer] Interactive Patient Education 2016 [employer] GENERAL RISKS AND COMPLICATIONS  What are the risk, side effects and possible complications? Generally speaking, most procedures are safe.  However, with any procedure there are risks, side effects, and the possibility of complications.  The risks and complications are dependent upon the sites that are lesioned, or the type of nerve block to be performed.  The closer the procedure is to the spine, the more serious the risks are.  Great care is taken when placing the radio frequency needles, block needles or lesioning probes, but sometimes complications can occur. 1. Infection: Any time there is an injection through the skin, there is a risk of infection.  This is why sterile conditions are used for these blocks.  There are four possible types of infection. 1. Localized skin infection. 2. Central Nervous System Infection-This can be in the form of Meningitis, which can be deadly. 3. Epidural Infections-This can be in the form of an epidural abscess, which can cause pressure inside of the spine, causing compression of the spinal cord with subsequent paralysis. This would require an emergency surgery to decompress, and there are no guarantees that the patient would recover from the paralysis. 4. Discitis-This is an infection of the intervertebral  discs.  It occurs in about 1% of discography procedures.  It is difficult to treat and it may lead to surgery.        2. Pain: the needles have to go through skin and soft tissues, will cause soreness.       3. Damage to internal structures:  The nerves to be lesioned may be near blood vessels or    other nerves which can be potentially damaged.       4. Bleeding: Bleeding is more common if the patient is taking blood thinners such as  aspirin, Coumadin, Ticiid, Plavix, etc., or if he/she have some genetic predisposition  such as hemophilia. Bleeding into the spinal canal can cause compression of the spinal  cord with subsequent paralysis.  This would require an emergency surgery to  decompress and there are no guarantees that the patient would recover from the  paralysis.       5. Pneumothorax:  Puncturing of a lung is a possibility, every time a needle is introduced in  the area of the chest or upper back.  Pneumothorax refers to free air around the  collapsed lung(s), inside of the thoracic cavity (chest cavity).  Another two  possible  complications related to a similar event would include: Hemothorax and Chylothorax.   These are variations of the Pneumothorax, where instead of air around the collapsed  lung(s), you may have blood or chyle, respectively.       6. Spinal headaches: They may occur with any procedures in the area of the spine.       7. Persistent CSF (Cerebro-Spinal Fluid) leakage: This is a rare problem, but may occur  with prolonged intrathecal or epidural catheters either due to the formation of a fistulous  track or a dural tear.       8. Nerve damage: By working so close to the spinal cord, there is always a possibility of  nerve damage, which could be as serious as a permanent spinal cord injury with  paralysis.       9. Death:  Although rare, severe deadly allergic reactions known as "Anaphylactic  reaction" can occur to any of the medications used.      10. Worsening of the  symptoms:  We can always make thing worse.  What are the chances of something like this happening? Chances of any of this occuring are extremely low.  By statistics, you have more of a chance of getting killed in a motor vehicle accident: while driving to the hospital than any of the above occurring .  Nevertheless, you should be aware that they are possibilities.  In general, it is similar to taking a shower.  Everybody knows that you can slip, hit your head and get killed.  Does that mean that you should not shower again?  Nevertheless always keep in mind that statistics do not mean anything if you happen to be on the wrong side of them.  Even if a procedure has a 1 (one) in a 1,000,000 (million) chance of going wrong, it you happen to be that one..Also, keep in mind that by statistics, you have more of a chance of having something go wrong when taking medications.  Who should not have this procedure? If you are on a blood thinning medication (e.g. Coumadin, Plavix, see list of "Blood Thinners"), or if you have an active infection going on, you should not have the procedure.  If you are taking any blood thinners, please inform your physician.  How should I prepare for this procedure?  Do not eat or drink anything at least six hours prior to the procedure.  Bring a driver with you .  It cannot be a taxi.  Come accompanied by an adult that can drive you back, and that is strong enough to help you if your legs get weak or numb from the local anesthetic.  Take all of your medicines the morning of the procedure with just enough water to swallow them.  If you have diabetes, make sure that you are scheduled to have your procedure done first thing in the morning, whenever possible.  If you have diabetes, take only half of your insulin dose and notify our nurse that you have done so as soon as you arrive at the clinic.  If you are diabetic, but only take blood sugar pills (oral hypoglycemic), then do  not take them on the morning of your procedure.  You may take them after you have had the procedure.  Do not take aspirin or any aspirin-containing medications, at least eleven (11) days prior to the procedure.  They may prolong bleeding.  Wear loose fitting clothing that may be easy to take off  and that you would not mind if it got stained with Betadine or blood.  Do not wear any jewelry or perfume  Remove any nail coloring.  It will interfere with some of our monitoring equipment.  NOTE: Remember that this is not meant to be interpreted as a complete list of all possible complications.  Unforeseen problems may occur.  BLOOD THINNERS The following drugs contain aspirin or other products, which can cause increased bleeding during surgery and should not be taken for 2 weeks prior to and 1 week after surgery.  If you should need take something for relief of minor pain, you may take acetaminophen which is found in Tylenol,m Datril, Anacin-3 and Panadol. It is not blood thinner. The products listed below are.  Do not take any of the products listed below in addition to any listed on your instruction sheet.  A.P.C or A.P.C with Codeine Codeine Phosphate Capsules #3 Ibuprofen Ridaura  ABC compound Congesprin Imuran rimadil  Advil Cope Indocin Robaxisal  Alka-Seltzer Effervescent Pain Reliever and Antacid Coricidin or Coricidin-D  Indomethacin Rufen  Alka-Seltzer plus Cold Medicine Cosprin Ketoprofen S-A-C Tablets  Anacin Analgesic Tablets or Capsules Coumadin Korlgesic Salflex  Anacin Extra Strength Analgesic tablets or capsules CP-2 Tablets Lanoril Salicylate  Anaprox Cuprimine Capsules Levenox Salocol  Anexsia-D Dalteparin Magan Salsalate  Anodynos Darvon compound Magnesium Salicylate Sine-off  Ansaid Dasin Capsules Magsal Sodium Salicylate  Anturane Depen Capsules Marnal Soma  APF Arthritis pain formula Dewitt's Pills Measurin Stanback  Argesic Dia-Gesic Meclofenamic Sulfinpyrazone   Arthritis Bayer Timed Release Aspirin Diclofenac Meclomen Sulindac  Arthritis pain formula Anacin Dicumarol Medipren Supac  Analgesic (Safety coated) Arthralgen Diffunasal Mefanamic Suprofen  Arthritis Strength Bufferin Dihydrocodeine Mepro Compound Suprol  Arthropan liquid Dopirydamole Methcarbomol with Aspirin Synalgos  ASA tablets/Enseals Disalcid Micrainin Tagament  Ascriptin Doan's Midol Talwin  Ascriptin A/D Dolene Mobidin Tanderil  Ascriptin Extra Strength Dolobid Moblgesic Ticlid  Ascriptin with Codeine Doloprin or Doloprin with Codeine Momentum Tolectin  Asperbuf Duoprin Mono-gesic Trendar  Aspergum Duradyne Motrin or Motrin IB Triminicin  Aspirin plain, buffered or enteric coated Durasal Myochrisine Trigesic  Aspirin Suppositories Easprin Nalfon Trillsate  Aspirin with Codeine Ecotrin Regular or Extra Strength Naprosyn Uracel  Atromid-S Efficin Naproxen Ursinus  Auranofin Capsules Elmiron Neocylate Vanquish  Axotal Emagrin Norgesic Verin  Azathioprine Empirin or Empirin with Codeine Normiflo Vitamin E  Azolid Emprazil Nuprin Voltaren  Bayer Aspirin plain, buffered or children's or timed BC Tablets or powders Encaprin Orgaran Warfarin Sodium  Buff-a-Comp Enoxaparin Orudis Zorpin  Buff-a-Comp with Codeine Equegesic Os-Cal-Gesic   Buffaprin Excedrin plain, buffered or Extra Strength Oxalid   Bufferin Arthritis Strength Feldene Oxphenbutazone   Bufferin plain or Extra Strength Feldene Capsules Oxycodone with Aspirin   Bufferin with Codeine Fenoprofen Fenoprofen Pabalate or Pabalate-SF   Buffets II Flogesic Panagesic   Buffinol plain or Extra Strength Florinal or Florinal with Codeine Panwarfarin   Buf-Tabs Flurbiprofen Penicillamine   Butalbital Compound Four-way cold tablets Penicillin   Butazolidin Fragmin Pepto-Bismol   Carbenicillin Geminisyn Percodan   Carna Arthritis Reliever Geopen Persantine   Carprofen Gold's salt Persistin   Chloramphenicol Goody's Phenylbutazone    Chloromycetin Haltrain Piroxlcam   Clmetidine heparin Plaquenil   Cllnoril Hyco-pap Ponstel   Clofibrate Hydroxy chloroquine Propoxyphen         Before stopping any of these medications, be sure to consult the physician who ordered them.  Some, such as Coumadin (Warfarin) are ordered to prevent or treat serious conditions such as "deep thrombosis", "pumonary embolisms", and  other heart problems.  The amount of time that you may need off of the medication may also vary with the medication and the reason for which you were taking it.  If you are taking any of these medications, please make sure you notify your pain physician before you undergo any procedures.          

## 2016-04-29 NOTE — Progress Notes (Signed)
Patient's Name: Cynthia Bennett  Patient type: Established  MRN: [id number]  Service setting: Ambulatory outpatient  DOB: 09-05-1933  Location: [facility]  DOS: 04/29/2016  Primary Care Physician: [name], MD  Note by: Kathlen Brunswick. Dossie Arbour, M.D, DABA, DABAPM, DABPM, Milagros Evener, FIPP  Referring Physician: Milinda Pointer, MD  Specialty: Board-Certified Interventional Pain Management  Last Visit to Pain Management: 02/18/2016   Primary Reason(s) for Visit: Interventional Pain Management Treatment. CC: Back Pain  Primary Diagnosis: Chronic radicular lumbar pain [M54.16, G89.29]   Procedure:  Anesthesia, Analgesia, Anxiolysis:  Type: Therapeutic Inter-Laminar Epidural Steroid Injection Region: Lumbar Level: L4-5 Level. Laterality: Left Paramedial  Indications: 1. Chronic lumbar radicular pain (Location of Secondary source of pain) (Left) (L5 Dermatome)   2. Chronic hip pain (Left)   3. Chronic sacroiliac joint pain (Left)   4. Primary osteoarthritis of left hip   5. Chronic pain   6. Severe (5-6 mm) L4-5 lumbar spinal stenosis and (8 mm) L3-4 spinal stenosis.     Pre-procedure Pain Score: 8/10  Reported level of pain is compatible with clinical observations Post-procedure Pain Score: 0-No pain  Type: Local Anesthesia Local Anesthetic: Lidocaine 1% Route: Infiltration ([place]/IM) IV Access: Declined Sedation: Declined  Indication(s): Analgesia          Pre-Procedure Assessment:  Cynthia Bennett is a 82 y.o. year old, female patient, seen today for interventional treatment. She has Fibromyalgia; Adiposity; Current tobacco use; Bilateral low back pain with left-sided sciatica; Chronic pain syndrome; Lumbar radicular pain (Bilateral) (R>L); Encounter for therapeutic drug level monitoring; Long term current use of opiate analgesic; Long term prescription opiate use; Uncomplicated opioid dependence ([place]); Opiate use; Acid reflux; HLD (hyperlipidemia); Cannot sleep; Primary  open angle glaucoma; Post menopausal syndrome; Temporary cerebral vascular dysfunction; Chronic low back pain (Location of Primary Source of Pain) (Bilateral) (L>R); Grade 1 spondylolisthesis of L4 over L5; Severe (5-6 mm) L4-5 lumbar spinal stenosis and (8 mm) L3-4 spinal stenosis.; Lumbar discogenic pain syndrome; Lumbar facet syndrome (Location of Primary Source of Pain) (Bilateral) (L>R); Lumbar facet hypertrophy (Bilateral); Chronic sacroiliac joint pain (Left); Chronic hip pain (Left); Peptic ulcer disease; Morbid obesity ([place]); GERD (gastroesophageal reflux disease); Chronic pain; Encounter for chronic pain management; Opiate analgesic contract exists ([facility]); Lumbar spondylosis; Abnormal MRI, lumbar spine (03/20/2000); Cervical spondylosis (Anterolisthesis of C5 over C6); Difficulty voiding; Neurogenic bladder; Chronic lower extremity pain (Location of Secondary source of pain) (Left); Chronic lumbar radicular pain (Location of Secondary source of pain) (Left) (L5 Dermatome); and Osteoarthritis of hip (Left) on her problem list.. Her primarily concern today is the Back Pain   Pain Type: Chronic pain Pain Location: Back Pain Orientation: Lower, Right, Left Pain Descriptors / Indicators: Aching, Burning Pain Frequency: Intermittent  Date of Last Visit: 02/18/16 Service Provided on Last Visit: Evaluation   Today's physical exam was positive for bilateral sacroiliac joint pain (L>R) and left hip pain, as well as lumbar facet pain. However, she also presented with numbness of the left lower extremity all the way down into her foot, weakness of the left lower extremity, and pain that was alleviated by flexion of the lumbar spine suggesting a flareup of her severe L4-5 lumbar spinal stenosis. Because of this, today we have decided to proceed with a left medial L4-5 lumbar epidural steroid injection and we will have her return in 2 weeks to reevaluate the left hip  and left SI joint pain. If still active, then we'll proceed with a  diagnostic left intra-articular hip and sacroiliac joint injection under fluoroscopic guidance, no sedation.  Coagulation Parameters Lab Results  Component Value Date   INR 1.0 12/30/2011   LABPROT 13.9 12/30/2011   PLT 257 04/28/2014    Verification of the correct person, correct site (including marking of site), and correct procedure were performed and confirmed by the patient.  Consent: Secured. Under the influence of no sedatives a written informed consent was obtained, after having provided information on the risks and possible complications. To fulfill our ethical and legal obligations, as recommended by the American Medical Association's Code of Ethics, we have provided information to the patient about our clinical impression; the nature and purpose of the treatment or procedure; the risks, benefits, and possible complications of the intervention; alternatives; the risk(s) and benefit(s) of the alternative treatment(s) or procedure(s); and the risk(s) and benefit(s) of doing nothing. The patient was provided information about the risks and possible complications associated with the procedure. These include, but are not limited to, failure to achieve desired goals, infection, bleeding, organ or nerve damage, allergic reactions, paralysis, and death. In the case of spinal procedures these may include, but are not limited to, failure to achieve desired goals, infection, bleeding, organ or nerve damage, allergic reactions, paralysis, and death. In addition, the patient was informed that Medicine is not an exact science; therefore, there is also the possibility of unforeseen risks and possible complications that may result in a catastrophic outcome. The patient indicated having understood very clearly. We have given the patient no guarantees and we have made no promises. Enough time was given to the patient to ask questions, all of  which were answered to the patient's satisfaction.  Consent Attestation: I, the ordering provider, attest that I have discussed with the patient the benefits, risks, side-effects, alternatives, likelihood of achieving goals, and potential problems during recovery for the procedure that I have provided informed consent.  Pre-Procedure Preparation: Safety Precautions: Allergies reviewed. Appropriate site, procedure, and patient were confirmed by following the Joint Commission's Universal Protocol (UP.01.01.01), in the form of a "Time Out". The patient was asked to confirm marked site and procedure, before commencing. The patient was asked about blood thinners, or active infections, both of which were denied. Patient was assessed for positional comfort and all pressure points were checked before starting procedure. Allergies: She is allergic to ibuprofen and nsaids.. Infection Control Precautions: Sterile technique used. Standard Universal Precautions were taken as recommended by the Department of [facility] for Disease Control and Prevention (CDC). Standard pre-surgical skin prep was conducted. Respiratory hygiene and cough etiquette was practiced. Hand hygiene observed. Safe injection practices and needle disposal techniques followed. SDV (single dose vial) medications used. Medications properly checked for expiration dates and contaminants. Personal protective equipment (PPE) used: Surgical mask. Sterile Radiation-resistant gloves. Monitoring:  As per clinic protocol. Filed Vitals:   04/29/16 1259 04/29/16 1445 04/29/16 1453 04/29/16 1500  BP: 118/70 152/74 140/80 155/76  Pulse: 82 70 55 64  Temp: 98.4 F (36.9 C)     TempSrc: Oral     Resp: _0 Height: _1  (1.6 m)     Weight: 175 lb (79.379 kg)     SpO2: 97% 95% 97% 98%  Calculated BMI: Body mass index is 31.01 kg/(m^2).  Description of Procedure Process:  Time-out: "Time-out" completed before starting procedure, as per  protocol. Position: Prone Target Area: For Epidural Steroid injections, the target area is the  interlaminar space, initially targeting  the lower border of the superior vertebral body lamina. Approach: Posterior approach. Area Prepped: Entire Posterior Lumbosacral Region Prepping solution: ChloraPrep (2% chlorhexidine gluconate and 70% isopropyl alcohol) Safety Precautions: Aspiration looking for blood return was conducted prior to all injections. At no point did we inject any substances, as a needle was being advanced. No attempts were made at seeking any paresthesias. Safe injection practices and needle disposal techniques used. Medications properly checked for expiration dates. SDV (single dose vial) medications used.         Description of the Procedure: Protocol guidelines were followed. The patient was placed in position over the fluoroscopy table. The target area was identified and the area prepped in the usual manner. Skin desensitized using vapocoolant spray. Skin & deeper tissues infiltrated with local anesthetic. Appropriate amount of time allowed to pass for local anesthetics to take effect. The procedure needle was introduced through the skin, ipsilateral to the reported pain, and advanced to the target area. Bone was contacted and the needle walked caudad, until the lamina was cleared. The epidural space was identified using "loss-of-resistance technique" with 2-3 ml of PF-NaCl (0.9% NSS), in a 5cc LOR glass syringe. Proper needle placement secured. Negative aspiration confirmed. Solution injected in intermittent fashion, asking for systemic symptoms every 0.5cc of injectate. The needles were then removed and the area cleansed, making sure to leave some of the prepping solution back to take advantage of its long term bactericidal properties. EBL: None Materials & Medications Used:  Needle(s) Used: 20g - 10cm, Tuohy-style epidural needle Medication(s): Please see chart orders for medication  and dosing details.  Imaging Guidance:  Type of Imaging Technique: Fluoroscopy Guidance (Spinal) Indication(s): Assistance in needle guidance and placement for procedures requiring needle placement in or near specific anatomical locations not easily accessible without such assistance. Exposure Time: Please see nurses notes. Contrast: Before injecting any contrast, we confirmed that the patient did not have an allergy to iodine, shellfish, or radiological contrast. Once satisfactory needle placement was completed at the desired level, radiological contrast was injected. Injection was conducted under continuous fluoroscopic guidance. Injection of contrast accomplished without complications. See chart for type and volume of contrast used. Fluoroscopic Guidance: I was personally present in the fluoroscopy suite, where the patient was placed in position for the procedure, over the fluoroscopy-compatible table. Fluoroscopy was manipulated, using "Tunnel Vision Technique", to obtain the best possible view of the target area, on the affected side. Parallax error was corrected before commencing the procedure. A "direction-depth-direction" technique was used to introduce the needle under continuous pulsed fluoroscopic guidance. Once the target was reached, antero-posterior, oblique, and lateral fluoroscopic projection views were taken to confirm needle placement in all planes. Permanently recorded images stored by scanning into EMR. Interpretation: Intraoperative imaging interpretation by performing Physician. Adequate needle placement confirmed in AP & Oblique Views. Appropriate spread of contrast to desired area. No evidence of afferent or efferent intravascular uptake. No intrathecal or subarachnoid spread observed. Permanent images scanned into the patient's record.  Antibiotic Prophylaxis:  Indication(s): No indications identified. Type:  Antibiotics Given (last 72 hours)    None       Post-operative  Assessment:   Complications: No immediate post-treatment complications were observed. Relevant Post-operative Information:  Disposition: Return to clinic for follow-up evaluation. The patient tolerated the entire procedure well. A repeat set of vitals were taken after the procedure and the patient was kept under observation following institutional policy, for this procedure. Post-procedural neurological assessment was performed, showing return to baseline,  prior to discharge. The patient was discharged home, once institutional criteria were met. The patient was provided with post-procedure discharge instructions, including a section on how to identify potential problems. Should any problems arise concerning this procedure, the patient was given instructions to immediately contact us, at any time, without hesitation. In any case, we plan to contact the patient by telephone for a follow-up status report regarding this interventional procedure. Comments:  No additional relevant information.  Medications administered during this visit: We administered ropivacaine (PF) 2 mg/ml (0.2%), lidocaine (PF), sodium chloride, and triamcinolone acetonide.  Prescriptions ordered during this visit: New Prescriptions   No medications on file    Future Appointments Date Time Provider [place]  05/29/2016 1:45 PM Milinda Pointer, MD [facility] None    Primary Care Physician: [name], MD Location: [facility] Note by: Harriett Azar A. Dossie Arbour, M.D, DABA, DABAPM, DABPM, DABIPP, FIPP  Disclaimer:  Medicine is not an exact science. The only guarantee in medicine is that nothing is guaranteed. It is important to note that the decision to proceed with this intervention was based on the information collected from the patient. The Data and conclusions were drawn from the patient's questionnaire, the interview, and the physical examination. Because the information was provided in  large part by the patient, it cannot be guaranteed that it has not been purposely or unconsciously manipulated. Every effort has been made to obtain as much relevant data as possible for this evaluation. It is important to note that the conclusions that lead to this procedure are derived in large part from the available data. Always take into account that the treatment will also be dependent on availability of resources and existing treatment guidelines, considered by other Pain Management Practitioners as being common knowledge and practice, at the time of the intervention. For Medico-Legal purposes, it is also important to point out that variation in procedural techniques and pharmacological choices are the acceptable norm. The indications, contraindications, technique, and results of the above procedure should only be interpreted and judged by a Board-Certified Interventional Pain Specialist with extensive familiarity and expertise in the same exact procedure and technique. Attempts at providing opinions without similar or greater experience and expertise than that of the treating physician will be considered as inappropriate and unethical, and shall result in a formal complaint to the state medical board and applicable specialty societies.

## 2016-04-30 ENCOUNTER — Telehealth: Payer: Self-pay | Admitting: *Deleted

## 2016-04-30 NOTE — Telephone Encounter (Signed)
No problems post procedure. 

## 2016-05-15 DIAGNOSIS — F5104 Psychophysiologic insomnia: Secondary | ICD-10-CM | POA: Diagnosis not present

## 2016-05-15 DIAGNOSIS — K219 Gastro-esophageal reflux disease without esophagitis: Secondary | ICD-10-CM | POA: Diagnosis not present

## 2016-05-29 ENCOUNTER — Ambulatory Visit: Payer: PPO | Attending: Pain Medicine | Admitting: Pain Medicine

## 2016-05-29 ENCOUNTER — Encounter: Payer: Self-pay | Admitting: Pain Medicine

## 2016-05-29 VITALS — BP 108/53 | HR 62 | Temp 97.0°F | Resp 16 | Ht 63.0 in | Wt 180.0 lb

## 2016-05-29 DIAGNOSIS — M4316 Spondylolisthesis, lumbar region: Secondary | ICD-10-CM | POA: Insufficient documentation

## 2016-05-29 DIAGNOSIS — E785 Hyperlipidemia, unspecified: Secondary | ICD-10-CM | POA: Insufficient documentation

## 2016-05-29 DIAGNOSIS — M25559 Pain in unspecified hip: Secondary | ICD-10-CM

## 2016-05-29 DIAGNOSIS — M797 Fibromyalgia: Secondary | ICD-10-CM | POA: Insufficient documentation

## 2016-05-29 DIAGNOSIS — M5416 Radiculopathy, lumbar region: Secondary | ICD-10-CM | POA: Diagnosis not present

## 2016-05-29 DIAGNOSIS — G8929 Other chronic pain: Secondary | ICD-10-CM | POA: Diagnosis not present

## 2016-05-29 DIAGNOSIS — M545 Low back pain: Secondary | ICD-10-CM | POA: Insufficient documentation

## 2016-05-29 DIAGNOSIS — M79605 Pain in left leg: Secondary | ICD-10-CM | POA: Diagnosis not present

## 2016-05-29 DIAGNOSIS — N319 Neuromuscular dysfunction of bladder, unspecified: Secondary | ICD-10-CM | POA: Insufficient documentation

## 2016-05-29 DIAGNOSIS — Z720 Tobacco use: Secondary | ICD-10-CM | POA: Insufficient documentation

## 2016-05-29 DIAGNOSIS — Z79891 Long term (current) use of opiate analgesic: Secondary | ICD-10-CM | POA: Diagnosis not present

## 2016-05-29 DIAGNOSIS — M4726 Other spondylosis with radiculopathy, lumbar region: Secondary | ICD-10-CM | POA: Insufficient documentation

## 2016-05-29 DIAGNOSIS — K219 Gastro-esophageal reflux disease without esophagitis: Secondary | ICD-10-CM | POA: Insufficient documentation

## 2016-05-29 DIAGNOSIS — M4806 Spinal stenosis, lumbar region: Secondary | ICD-10-CM | POA: Insufficient documentation

## 2016-05-29 DIAGNOSIS — M4312 Spondylolisthesis, cervical region: Secondary | ICD-10-CM | POA: Diagnosis not present

## 2016-05-29 DIAGNOSIS — M160 Bilateral primary osteoarthritis of hip: Secondary | ICD-10-CM | POA: Insufficient documentation

## 2016-05-29 DIAGNOSIS — E789 Disorder of lipoprotein metabolism, unspecified: Secondary | ICD-10-CM | POA: Insufficient documentation

## 2016-05-29 DIAGNOSIS — M47816 Spondylosis without myelopathy or radiculopathy, lumbar region: Secondary | ICD-10-CM | POA: Insufficient documentation

## 2016-05-29 DIAGNOSIS — M533 Sacrococcygeal disorders, not elsewhere classified: Secondary | ICD-10-CM | POA: Insufficient documentation

## 2016-05-29 DIAGNOSIS — F17210 Nicotine dependence, cigarettes, uncomplicated: Secondary | ICD-10-CM | POA: Diagnosis not present

## 2016-05-29 DIAGNOSIS — K279 Peptic ulcer, site unspecified, unspecified as acute or chronic, without hemorrhage or perforation: Secondary | ICD-10-CM | POA: Diagnosis not present

## 2016-05-29 DIAGNOSIS — M5442 Lumbago with sciatica, left side: Secondary | ICD-10-CM | POA: Insufficient documentation

## 2016-05-29 MED ORDER — LIDOCAINE HCL (PF) 1 % IJ SOLN: 10.0000 mL | Freq: Once | INTRAMUSCULAR | Status: DC

## 2016-05-29 MED ORDER — METHYLPREDNISOLONE ACETATE 80 MG/ML IJ SUSP: 80.0000 mg | Freq: Once | INTRAMUSCULAR | Status: DC

## 2016-05-29 MED ORDER — ROPIVACAINE HCL 2 MG/ML IJ SOLN: 4.0000 mL | Freq: Once | INTRAMUSCULAR | Status: DC

## 2016-05-29 MED ORDER — METHYLPREDNISOLONE ACETATE 80 MG/ML IJ SUSP
INTRAMUSCULAR | Status: AC
  Administered 2016-05-29: 15:00:00
  Filled 2016-05-29: qty 1

## 2016-05-29 MED ORDER — ROPIVACAINE HCL 2 MG/ML IJ SOLN
INTRAMUSCULAR | Status: AC
  Administered 2016-05-29: 15:00:00
  Filled 2016-05-29: qty 10

## 2016-05-29 NOTE — Progress Notes (Signed)
Patient's Name: Cynthia Bennett  Patient type: Established  MRN: [id number]  Service setting: Ambulatory outpatient  DOB: 1933/04/28  Location: [facility]  DOS: 05/29/2016  Primary Care Physician: [name], MD  Note by: Kathlen Brunswick. Dossie Arbour, M.D, DABA, DABAPM, DABPM, Milagros Evener, FIPP  Referring Physician: Milinda Pointer, MD  Specialty: Board-Certified Interventional Pain Management  Last Visit to Pain Management: 04/30/2016   Primary Reason(s) for Visit: Interventional Pain Management Treatment. CC: Back Pain  Primary Diagnosis: Chronic sacroiliac joint pain [M53.3, G89.29]   Procedure:  Anesthesia, Analgesia, Anxiolysis:  Type: Diagnostic Sacroiliac Joint Steroid Injection Region: Superior Lumbosacral Region Level: PSIS (Posterior Superior Iliac Spine) Laterality: Right-Sided  Indications: 1. Chronic sacroiliac joint pain (Bilateral) (R>L)   2. Chronic sacroiliac joint pain (Left)   3. Chronic hip pain, unspecified laterality   4. Chronic lumbar radicular pain (Location of Secondary source of pain) (Left) (L5 Dermatome)   5. Lumbar radicular pain (Bilateral) (R>L)     Pre-procedure Pain Score: 8/10  Reported level of pain is compatible with clinical observations Post-procedure Pain Score: 0-No pain  Type: Local Anesthesia Local Anesthetic: Lidocaine 1% Route: Infiltration ([place]/IM) IV Access: Declined Sedation: Declined  Indication(s): Analgesia     Pre-Procedure Assessment:  Cynthia Bennett is a 82 y.o. year old, female patient, seen today for interventional treatment. She has Fibromyalgia; Adiposity; Current tobacco use; Bilateral low back pain with left-sided sciatica; Chronic pain syndrome; Lumbar radicular pain (Bilateral) (R>L); Encounter for therapeutic drug level monitoring; Long term current use of opiate analgesic; Long term prescription opiate use; Uncomplicated opioid dependence ([place]); Opiate use; Acid reflux; HLD (hyperlipidemia); Cannot sleep;  Primary open angle glaucoma; Post menopausal syndrome; Temporary cerebral vascular dysfunction; Chronic low back pain (Location of Primary Source of Pain) (Bilateral) (L>R); Grade 1 spondylolisthesis of L4 over L5; Severe (5-6 mm) L4-5 lumbar spinal stenosis and (8 mm) L3-4 spinal stenosis.; Lumbar discogenic pain syndrome; Lumbar facet syndrome (Location of Primary Source of Pain) (Bilateral) (L>R); Lumbar facet hypertrophy (Bilateral); Chronic sacroiliac joint pain (Left); Chronic hip pain (Left); Peptic ulcer disease; Morbid obesity ([place]); GERD (gastroesophageal reflux disease); Chronic pain; Encounter for chronic pain management; Opiate analgesic contract exists ([facility]); Lumbar spondylosis; Abnormal MRI, lumbar spine (03/20/2000); Cervical spondylosis (Anterolisthesis of C5 over C6); Difficulty voiding; Neurogenic bladder; Chronic lower extremity pain (Location of Secondary source of pain) (Left); Chronic lumbar radicular pain (Location of Secondary source of pain) (Left) (L5 Dermatome); Osteoarthritis of hip (Left); Chronic hip pain (Bilateral) (R>L); and Chronic sacroiliac joint pain (Bilateral) (R>L) on her problem list.. Her primarily concern today is the Back Pain   Pain Type: Chronic pain Pain Location: Back Pain Orientation: Right, Upper Pain Descriptors / Indicators: Sore, Burning Pain Frequency: Intermittent  Date of Last Visit: 04/29/16 Service Provided on Last Visit: Procedure  Coagulation Parameters Lab Results  Component Value Date   INR 1.0 12/30/2011   LABPROT 13.9 12/30/2011   PLT 257 04/28/2014    Verification of the correct person, correct site (including marking of site), and correct procedure were performed and confirmed by the patient.  Consent: Secured. Under the influence of no sedatives a written informed consent was obtained, after having provided information on the risks and possible complications. To fulfill our ethical and  legal obligations, as recommended by the American Medical Association's Code of Ethics, we have provided information to the patient about our clinical impression; the nature and purpose of the treatment or procedure; the risks, benefits, and possible  complications of the intervention; alternatives; the risk(s) and benefit(s) of the alternative treatment(s) or procedure(s); and the risk(s) and benefit(s) of doing nothing. The patient was provided information about the risks and possible complications associated with the procedure. These include, but are not limited to, failure to achieve desired goals, infection, bleeding, organ or nerve damage, allergic reactions, paralysis, and death. In the case of intra- or periarticular procedures these may include, but are not limited to, failure to achieve desired goals, infection, bleeding (hemarthrosis), organ or nerve damage, allergic reactions, and death. In addition, the patient was informed that Medicine is not an exact science; therefore, there is also the possibility of unforeseen risks and possible complications that may result in a catastrophic outcome. The patient indicated having understood very clearly. We have given the patient no guarantees and we have made no promises. Enough time was given to the patient to ask questions, all of which were answered to the patient's satisfaction.  Consent Attestation: I, the ordering provider, attest that I have discussed with the patient the benefits, risks, side-effects, alternatives, likelihood of achieving goals, and potential problems during recovery for the procedure that I have provided informed consent.  Pre-Procedure Preparation: Safety Precautions: Allergies reviewed. Appropriate site, procedure, and patient were confirmed by following the Joint Commission's Universal Protocol (UP.01.01.01), in the form of a "Time Out". The patient was asked to confirm marked site and procedure, before commencing. The patient  was asked about blood thinners, or active infections, both of which were denied. Patient was assessed for positional comfort and all pressure points were checked before starting procedure. Allergies: She is allergic to ibuprofen and nsaids.. Infection Control Precautions: Sterile technique used. Standard Universal Precautions were taken as recommended by the Department of [facility] for Disease Control and Prevention (CDC). Standard pre-surgical skin prep was conducted. Respiratory hygiene and cough etiquette was practiced. Hand hygiene observed. Safe injection practices and needle disposal techniques followed. SDV (single dose vial) medications used. Medications properly checked for expiration dates and contaminants. Personal protective equipment (PPE) used: Sterile Radiation-resistant gloves. Monitoring:  As per clinic protocol. Filed Vitals:   05/29/16 1448 05/29/16 1451 05/29/16 1456 05/29/16 1459  BP: 109/60 108/53  108/53  Pulse: 60 63 61 62  Temp:      TempSrc:      Resp: 15 17 16 16   Height:      Weight:      SpO2: 96% 94% 99% 96%  Calculated BMI: Body mass index is 31.89 kg/(m^2).  Description of Procedure Process:  Time-out: "Time-out" completed before starting procedure, as per protocol. Position: Prone Target Area: Superior, posterior, aspect of the sacroiliac fissure Approach: Posterior, paraspinal, ipsilateral approach. Area Prepped: Entire Lower Lumbosacral Region Prepping solution: ChloraPrep (2% chlorhexidine gluconate and 70% isopropyl alcohol) Safety Precautions: Aspiration looking for blood return was conducted prior to all injections. At no point did we inject any substances, as a needle was being advanced. No attempts were made at seeking any paresthesias. Safe injection practices and needle disposal techniques used. Medications properly checked for expiration dates. SDV (single dose vial) medications used.   Description of the Procedure: Protocol guidelines  were followed. The patient was placed in position over the procedure table. The target area was identified and the area prepped in the usual manner. Skin & deeper tissues infiltrated with local anesthetic. Appropriate amount of time allowed to pass for local anesthetics to take effect. The procedure needle was advanced under fluoroscopic guidance into the sacroiliac joint until a  firm endpoint was obtained. Proper needle placement secured. Negative aspiration confirmed. Solution injected in intermittent fashion, asking for systemic symptoms every 0.5cc of injectate. The needles were then removed and the area cleansed, making sure to leave some of the prepping solution back to take advantage of its long term bactericidal properties. EBL: None Materials & Medications Used:  Needle(s) Used: 22g - 3.5" Spinal Needle(s) Solution Injected: 0.2% PF-Ropivacaine (12m) + SDV-DepoMedrol 80 mg/ml (183m Medications Administered today: We administered ropivacaine (PF) 2 mg/ml (0.2%) and methylPREDNISolone acetate.Please see chart orders for dosing details.  Imaging Guidance  Type of Imaging Technique: Fluoroscopy Guidance (Spinal) Indication(s): Assistance in needle guidance and placement for procedures requiring needle placement in or near specific anatomical locations not easily accessible without such assistance. Exposure Time: Please see nurses notes. Contrast: None used. Fluoroscopic Guidance: I was personally present in the fluoroscopy suite, where the patient was placed in position for the procedure, over the fluoroscopy-compatible table. Fluoroscopy was manipulated, using "Tunnel Vision Technique", to obtain the best possible view of the target area, on the affected side. Parallax error was corrected before commencing the procedure. A "direction-depth-direction" technique was used to introduce the needle under continuous pulsed fluoroscopic guidance. Once the target was reached, antero-posterior, oblique, and  lateral fluoroscopic projection views were taken to confirm needle placement in all planes. Permanently recorded images stored by scanning into EMR. Interpretation: No contrast injected. Intraoperative imaging interpretation by performing Physician. Adequate needle placement confirmed. Needle placement confirmed in AP, lateral, & Oblique Views. Permanent hardcopy images in multiple planes scanned into the patient's record.  Antibiotic Prophylaxis:  Indication(s): No indications identified. Type:  Antibiotics Given (last 72 hours)    None       Post-operative Assessment  Complications: No immediate post-treatment complications were observed. Disposition: The patient was discharged home, once institutional criteria were met. Return to clinic in 2 weeks for follow-up evaluation and interpretation of results. The patient tolerated the entire procedure well. A repeat set of vitals were taken after the procedure and the patient was kept under observation following institutional policy, for this type of procedure. Post-procedural neurological assessment was performed, showing return to baseline, prior to discharge. The patient was provided with post-procedure discharge instructions, including a section on how to identify potential problems. Should any problems arise concerning this procedure, the patient was given instructions to immediately contact us[place]oreaat any time, without hesitation. In any case, we plan to contact the patient by telephone for a follow-up status report regarding this interventional procedure. Comments:  No additional relevant information.  Medications administered during this visit: We administered ropivacaine (PF) 2 mg/ml (0.2%) and methylPREDNISolone acetate.  Prescriptions ordered during this visit: New Prescriptions   No medications on file    Requested PM Follow-up: Return for Keep prior appointment.  Future Appointments Date Time Provider [place]8/15/2017  10:00 AM [facility]Milinda [facility]    Primary Care Physician: JaMaryland PinkMD [facility] Note by: Fares Ramthun A. NaDossie ArbourM.D, DABA, DABAPM, DABPM, DABIPP, FIPP  Disclaimer:  Medicine is not an exact science. The only guarantee in medicine is that nothing is guaranteed. It is important to note that the decision to proceed with this intervention was based on the information collected from the patient. The Data and conclusions were drawn from the patient's questionnaire, the interview, and the physical examination. Because the information was provided in large part by the patient, it cannot be guaranteed that it has not been purposely  or unconsciously manipulated. Every effort has been made to obtain as much relevant data as possible for this evaluation. It is important to note that the conclusions that lead to this procedure are derived in large part from the available data. Always take into account that the treatment will also be dependent on availability of resources and existing treatment guidelines, considered by other Pain Management Practitioners as being common knowledge and practice, at the time of the intervention. For Medico-Legal purposes, it is also important to point out that variation in procedural techniques and pharmacological choices are the acceptable norm. The indications, contraindications, technique, and results of the above procedure should only be interpreted and judged by a Board-Certified Interventional Pain Specialist with extensive familiarity and expertise in the same exact procedure and technique. Attempts at providing opinions without similar or greater experience and expertise than that of the treating physician will be considered as inappropriate and unethical, and shall result in a formal complaint to the state medical board and applicable specialty societies.

## 2016-05-29 NOTE — Progress Notes (Signed)
Safety precautions to be maintained throughout the outpatient stay will include: orient to surroundings, keep bed in low position, maintain call bell within reach at all times, provide assistance with transfer out of bed and ambulation.  

## 2016-05-29 NOTE — Patient Instructions (Signed)
Sacroiliac (SI) Joint Injection Patient Information  Description: The sacroiliac joint connects the scrum (very low back and tailbone) to the ilium (a pelvic bone which also forms half of the hip joint).  Normally this joint experiences very little motion.  When this joint becomes inflamed or unstable low back and or hip and pelvis pain may result.  Injection of this joint with local anesthetics (numbing medicines) and steroids can provide diagnostic information and reduce pain.  This injection is performed with the aid of x-ray guidance into the tailbone area while you are lying on your stomach.   You may experience an electrical sensation down the leg while this is being done.  You may also experience numbness.  We also may ask if we are reproducing your normal pain during the injection.  Conditions which may be treated SI injection:   Low back, buttock, hip or leg pain  Preparation for the Injection:  1. Do not eat any solid food or dairy products within 8 hours of your appointment.  2. You may drink clear liquids up to 3 hours before appointment.  Clear liquids include water, black coffee, juice or soda.  No milk or cream please. 3. You may take your regular medications, including pain medications with a sip of water before your appointment.  Diabetics should hold regular insulin (if take separately) and take 1/2 normal NPH dose the morning of the procedure.  Carry some sugar containing items with you to your appointment. 4. A driver must accompany you and be prepared to drive you home after your procedure. 5. Bring all of your current medications with you. 6. An IV may be inserted and sedation may be given at the discretion of the physician. 7. A blood pressure cuff, EKG and other monitors will often be applied during the procedure.  Some patients may need to have extra oxygen administered for a short period.  8. You will be asked to provide medical information, including your allergies,  prior to the procedure.  We must know immediately if you are taking blood thinners (like Coumadin/Warfarin) or if you are allergic to IV iodine contrast (dye).  We must know if you could possible be pregnant.  Possible side effects:   Bleeding from needle site  Infection (rare, may require surgery)  Nerve injury (rare)  Numbness & tingling (temporary)  A brief convulsion or seizure  Light-headedness (temporary)  Pain at injection site (several days)  Decreased blood pressure (temporary)  Weakness in the leg (temporary)   Call if you experience:   New onset weakness or numbness of an extremity below the injection site that last more than 8 hours.  Hives or difficulty breathing ( go to the emergency room)  Inflammation or drainage at the injection site  Any new symptoms which are concerning to you  Please note:  Although the local anesthetic injected can often make your back/ hip/ buttock/ leg feel good for several hours after the injections, the pain will likely return.  It takes 3-7 days for steroids to work in the sacroiliac area.  You may not notice any pain relief for at least that one week.  If effective, we will often do a series of three injections spaced 3-6 weeks apart to maximally decrease your pain.  After the initial series, we generally will wait some months before a repeat injection of the same type.  If you have any questions, please call [phone] [facility]  Pain Management Discharge   Instructions  General Discharge Instructions :  If you need to reach your doctor call: Monday-Friday 8:00 am - 4:00 pm at [phone] or toll free [phone].  After clinic hours [phone] to have operator reach doctor.  Bring all of your medication bottles to all your appointments in the pain clinic.  To cancel or reschedule your appointment with Pain Management please remember to call 24 hours in advance to avoid a  fee.  Refer to the educational materials which you have been given on: General Risks, I had my Procedure. Discharge Instructions, Post Sedation.  Post Procedure Instructions:  The drugs you were given will stay in your system until tomorrow, so for the next 24 hours you should not drive, make any legal decisions or drink any alcoholic beverages.  You may eat anything you prefer, but it is better to start with liquids then soups and crackers, and gradually work up to solid foods.  Please notify your doctor immediately if you have any unusual bleeding, trouble breathing or pain that is not related to your normal pain.  Depending on the type of procedure that was done, some parts of your body may feel week and/or numb.  This usually clears up by tonight or the next day.  Walk with the use of an assistive device or accompanied by an adult for the 24 hours.  You may use ice on the affected area for the first 24 hours.  Put ice in a Ziploc bag and cover with a towel and place against area 15 minutes on 15 minutes off.  You may switch to heat after 24 hours. 

## 2016-05-30 ENCOUNTER — Telehealth: Payer: Self-pay | Admitting: *Deleted

## 2016-05-30 NOTE — Telephone Encounter (Signed)
Spoke with patient re; procedure on yesterday, denies any concerns.

## 2016-06-24 ENCOUNTER — Other Ambulatory Visit
Admission: RE | Admit: 2016-06-24 | Discharge: 2016-06-24 | Disposition: A | Discharge status: Home / Self Care | Payer: PPO | Source: Ambulatory Visit | Attending: Pain Medicine | Admitting: Pain Medicine

## 2016-06-24 ENCOUNTER — Encounter: Payer: Self-pay | Admitting: Pain Medicine

## 2016-06-24 ENCOUNTER — Ambulatory Visit (HOSPITAL_BASED_OUTPATIENT_CLINIC_OR_DEPARTMENT_OTHER): Payer: PPO | Admitting: Pain Medicine

## 2016-06-24 VITALS — BP 149/77 | HR 94 | Temp 98.6°F | Resp 18 | Ht 63.0 in | Wt 175.0 lb

## 2016-06-24 DIAGNOSIS — F1190 Opioid use, unspecified, uncomplicated: Secondary | ICD-10-CM

## 2016-06-24 DIAGNOSIS — M5416 Radiculopathy, lumbar region: Secondary | ICD-10-CM

## 2016-06-24 DIAGNOSIS — Z5181 Encounter for therapeutic drug level monitoring: Secondary | ICD-10-CM | POA: Diagnosis not present

## 2016-06-24 DIAGNOSIS — M545 Low back pain, unspecified: Secondary | ICD-10-CM

## 2016-06-24 DIAGNOSIS — M47816 Spondylosis without myelopathy or radiculopathy, lumbar region: Secondary | ICD-10-CM

## 2016-06-24 DIAGNOSIS — Z79891 Long term (current) use of opiate analgesic: Secondary | ICD-10-CM

## 2016-06-24 DIAGNOSIS — G8929 Other chronic pain: Secondary | ICD-10-CM

## 2016-06-24 LAB — CBC
HEMATOCRIT: 38.6 % (ref 35.0–47.0)
Hemoglobin: 12.7 g/dL (ref 12.0–16.0)
MCH: 25.9 pg — ABNORMAL LOW (ref 26.0–34.0)
MCHC: 32.9 g/dL (ref 32.0–36.0)
MCV: 78.7 fL — AB (ref 80.0–100.0)
PLATELETS: 291 10*3/uL (ref 150–440)
RBC: 4.91 MIL/uL (ref 3.80–5.20)
RDW: 20.4 % — ABNORMAL HIGH (ref 11.5–14.5)
WBC: 9 10*3/uL (ref 3.6–11.0)

## 2016-06-24 LAB — COMPREHENSIVE METABOLIC PANEL
ALBUMIN: 3.9 g/dL (ref 3.5–5.0)
ALK PHOS: 70 U/L (ref 38–126)
ALT: 11 U/L — ABNORMAL LOW (ref 14–54)
ANION GAP: 8 (ref 5–15)
AST: 17 U/L (ref 15–41)
BILIRUBIN TOTAL: 0.5 mg/dL (ref 0.3–1.2)
BUN: 13 mg/dL (ref 6–20)
CALCIUM: 9.1 mg/dL (ref 8.9–10.3)
CO2: 22 mmol/L (ref 22–32)
Chloride: 107 mmol/L (ref 101–111)
Creatinine, Ser: 0.95 mg/dL (ref 0.44–1.00)
GFR, EST NON AFRICAN AMERICAN: 55 mL/min — AB (ref >60)
Glucose, Bld: 84 mg/dL (ref 65–99)
POTASSIUM: 4.3 mmol/L (ref 3.5–5.1)
Sodium: 137 mmol/L (ref 135–145)
TOTAL PROTEIN: 7 g/dL (ref 6.5–8.1)

## 2016-06-24 LAB — VITAMIN B12: Vitamin B-12: 240 pg/mL (ref 180–914)

## 2016-06-24 LAB — PROTIME-INR
INR: 0.96
Prothrombin Time: 12.8 seconds (ref 11.4–15.2)

## 2016-06-24 LAB — SEDIMENTATION RATE: Sed Rate: 23 mm/hr (ref 0–30)

## 2016-06-24 LAB — MAGNESIUM: MAGNESIUM: 2.2 mg/dL (ref 1.7–2.4)

## 2016-06-24 LAB — C-REACTIVE PROTEIN: CRP: 0.6 mg/dL (ref <1.0)

## 2016-06-24 MED ORDER — HYDROCODONE-ACETAMINOPHEN 5-325 MG PO TABS: 1.0000 | ORAL_TABLET | Freq: Two times a day (BID) | ORAL | 0 refills | Status: DC | PRN

## 2016-06-24 NOTE — Progress Notes (Signed)
Patient does not have medications for count today.

## 2016-06-24 NOTE — Progress Notes (Signed)
Patient's Name: Cynthia Bennett  Patient type: Established  MRN: [id number]  Service setting: Ambulatory outpatient  DOB: [birth year]  Location: [facility]  DOS: 06/24/2016  Primary Care Physician: Jerl Mina, MD  Note by: Sydnee Levans. Laban Emperor, M.D, DABA, DABAPM, DABPM, Olga Coaster, FIPP  Referring Physician: Jerl Mina, MD  Specialty: Board-Certified Interventional Pain Management  Last Bennett to Pain Management: 05/30/2016   Primary Reason(s) for Bennett: Encounter for prescription drug management & post-procedure evaluation of chronic illness with mild to moderate exacerbation(Level of risk: moderate) CC: Hip Pain (hip)   HPI  Cynthia Bennett is a 82 y.o. year old, female patient, who returns today as an established patient. She has Fibromyalgia; Adiposity; Current tobacco use; Bilateral low back pain with left-sided sciatica; Chronic pain syndrome; Lumbar radicular pain (Bilateral) (R>L); Encounter for therapeutic drug level monitoring; Long term current use of opiate analgesic; Long term prescription opiate use; Uncomplicated opioid dependence (HCC); Opiate use; Acid reflux; HLD (hyperlipidemia); Cannot sleep; Primary open angle glaucoma; Post menopausal syndrome; Temporary cerebral vascular dysfunction; Chronic low back pain (Location of Primary Source of Pain) (Bilateral) (L>R); Grade 1 spondylolisthesis of L4 over L5; Severe (5-6 mm) L4-5 lumbar spinal stenosis and (8 mm) L3-4 spinal stenosis.; Lumbar discogenic pain syndrome; Lumbar facet syndrome (Location of Primary Source of Pain) (Bilateral) (L>R); Lumbar facet hypertrophy (Bilateral); Chronic sacroiliac joint pain (Left); Chronic hip pain (Left); Peptic ulcer disease; Morbid obesity (HCC); GERD (gastroesophageal reflux disease); Chronic pain; Encounter for chronic pain management; Opiate analgesic contract exists [employer] [facility]); Lumbar spondylosis; Abnormal MRI, lumbar spine (03/20/2000);  Cervical spondylosis (Anterolisthesis of C5 over C6); Difficulty voiding; Neurogenic bladder; Chronic lower extremity pain (Location of Secondary source of pain) (Left); Chronic lumbar radicular pain (Location of Secondary source of pain) (Left) (L5 Dermatome); Osteoarthritis of hip (Left); Chronic hip pain (Bilateral) (R>L); and Chronic sacroiliac joint pain (Bilateral) (R>L) on her problem list.. Her primarily concern today is the Hip Pain (hip)   Pain Assessment: Self-Reported Pain Score: 2              Reported level is compatible with observation       Pain Type: Chronic pain Pain Location: Hip Pain Orientation: Right Pain Descriptors / Indicators: Sore Pain Frequency: Intermittent  The patient comes into the clinics today for post-procedure evaluation on the interventional treatment done on 05/29/2016. In addition, she comes in today for pharmacological management of her chronic pain.  The patient  reports that she does not use drugs.  Date of Last Bennett: 05/29/16 Service Provided on Last Bennett: Procedure  Controlled Substance Pharmacotherapy Assessment & REMS (Risk Evaluation and Mitigation Strategy)  Analgesic: Hydrocodone/APAP 5/325 one tablet every 12 hours (10 mg/day) MME/day: 10 mg/day Pill Count: The patient did not bring her medications today to be counted. Pharmacokinetics: Onset of action (Liberation/Absorption): Within expected pharmacological parameters Time to Peak effect (Distribution): Timing and results are as within normal expected parameters Duration of action (Metabolism/Excretion): Within normal limits for medication Pharmacodynamics: Analgesic Effect: More than 50% Activity Facilitation: Medication(s) allow patient to sit, stand, walk, and do the basic ADLs Perceived Effectiveness: Described as relatively effective, allowing for increase in activities of daily living (ADL) Side-effects or Adverse reactions: None reported Monitoring: [place] PMP: Online review of  the past 6-month period conducted. Compliant with practice rules and regulations Last UDS on record: ToxAssure Select 13  Date Value Ref Range Status  01/23/2016 FINAL  Final    Comment:    ====================================================================  TOXASSURE SELECT 13 (MW) ==================================================================== Test                             Result       Flag       Units Drug Present and Declared for Prescription Verification   Hydrocodone                    1076         EXPECTED   ng/mg creat   Dihydrocodeine                 190          EXPECTED   ng/mg creat   Norhydrocodone                 1510         EXPECTED   ng/mg creat    Sources of hydrocodone include scheduled prescription    medications. Dihydrocodeine and norhydrocodone are expected    metabolites of hydrocodone. Dihydrocodeine is also available as a    scheduled prescription medication. ==================================================================== Test                      Result    Flag   Units      Ref Range   Creatinine              58               mg/dL      >=81 ==================================================================== Declared Medications:  The flagging and interpretation on this report are based on the  following declared medications.  Unexpected results may arise from  inaccuracies in the declared medications.  **Note: The testing scope of this panel includes these medications:  Hydrocodone (Norco)  Hydrocodone (Vicodin)  **Note: The testing scope of this panel does not include following  reported medications:  Acetaminophen (Norco)  Acetaminophen (Vicodin)  Bupropion (Wellbutrin)  Dorzolamide  Doxepin  Multivitamin  Omeprazole (Prilosec)  Pantoprazole (Protonix)  Sertraline (Zoloft)  Zolpidem (Ambien) ==================================================================== For clinical consultation, please call [phone]. ====================================================================    UDS interpretation: Compliant          Medication Assessment Form: Reviewed. Patient indicates being compliant with therapy Treatment compliance: Compliant Risk Assessment: Aberrant Behavior: None observed today Substance Use Disorder (SUD) Risk Level: Low Risk of opioid abuse or dependence: 0.7-3.0% with doses ? 36 MME/day and 6.1-26% with doses ? 120 MME/day. Opioid Risk Tool (ORT) Score:  0 Low Risk for SUD (Score <3) Depression Scale Score: PHQ-2: PHQ-2 Total Score: 0 No depression (0) PHQ-9: PHQ-9 Total Score: 0 No depression (0-4)  Pharmacologic Plan: No change in therapy, at this time  Post-Procedure Assessment  Procedure done on last Bennett: Right SI joint block on 05/29/2016 and left L4-5 lumbar epidural steroid injection on 04/29/2016. Side-effects or Adverse reactions: None reported Sedation: No sedation used  Results: Ultra-Short Term Relief (First 1 hour after procedure): 75 %  No IV Analgesics or Anxiolytics given, therefore the benefit is completely due to Local Anesthetics Short Term Relief (Initial 4-6 hrs after procedure): 75 % Complete relief confirms area to be the source of pain Long Term Relief : 90 % Long-term benefit would suggest an inflammatory etiology to the pain   Current Relief (Now): 90%  Persistent relief would suggest effective anti-inflammatory effects from steroids Interpretation of Results: This is primarily the results of the SI joint  block suggesting that this may be involved in her usual pain on the right side.  Laboratory Chemistry  Inflammation Markers Lab Results  Component Value Date   ESRSEDRATE 23 06/24/2016   CRP 0.6 06/24/2016    Renal Function Lab Results  Component Value Date   BUN 13 06/24/2016   CREATININE 0.95 06/24/2016   GFRAA >60 06/24/2016   GFRNONAA 55 (L) 06/24/2016    Hepatic Function Lab Results  Component Value Date   AST 17  06/24/2016   ALT 11 (L) 06/24/2016   ALBUMIN 3.9 06/24/2016    Electrolytes Lab Results  Component Value Date   NA 137 06/24/2016   K 4.3 06/24/2016   CL 107 06/24/2016   CALCIUM 9.1 06/24/2016   MG 2.2 06/24/2016    Pain Modulating Vitamins Lab Results  Component Value Date   VITAMINB12 240 06/24/2016    Coagulation Parameters Lab Results  Component Value Date   INR 0.96 06/24/2016   LABPROT 12.8 06/24/2016   PLT 291 06/24/2016    Cardiovascular Lab Results  Component Value Date   HGB 12.7 06/24/2016   HCT 38.6 06/24/2016    Note: Lab results reviewed.  Recent Diagnostic Imaging  Mm Screening Breast Tomo Bilateral  Result Date: 05/18/2015 CLINICAL DATA:  Screening. EXAM: DIGITAL SCREENING BILATERAL MAMMOGRAM WITH 3D TOMO WITH CAD COMPARISON:  Previous exam(s). ACR Breast Density Category b: There are scattered areas of fibroglandular density. FINDINGS: There are no findings suspicious for malignancy. Images were processed with CAD. IMPRESSION: No mammographic evidence of malignancy. A result letter of this screening mammogram will be mailed directly to the patient. RECOMMENDATION: Screening mammogram in one year. (Code:[id number]-B-01Y) BI-RADS CATEGORY  1: Negative. Electronically Signed   By: Beckie Salts M.D.   On: 05/18/2015 12:56    Meds  The patient has a current medication list which includes the following prescription(s): bupropion, dorzolamide-timolol, doxepin, hydrocodone-acetaminophen, hydrocodone-acetaminophen, hydrocodone-acetaminophen, pantoprazole, sertraline, zolpidem, and multi-vitamins.  Current Outpatient Prescriptions on File Prior to Bennett  Medication Sig  . buPROPion (WELLBUTRIN XL) 150 MG 24 hr tablet Take 150 mg by mouth daily.  . dorzolamide-timolol (COSOPT) 22.3-6.8 MG/ML ophthalmic solution INT 1 GTT IN OU BID  . doxepin (SINEQUAN) 100 MG capsule Take 100 mg by mouth at bedtime.  . pantoprazole (PROTONIX) 40 MG tablet Take 40 mg by mouth daily.    . sertraline (ZOLOFT) 100 MG tablet Take 100 mg by mouth daily.  Marland Kitchen zolpidem (AMBIEN) 10 MG tablet Take 10 mg by mouth at bedtime.    No current facility-administered medications on file prior to Bennett.     ROS  Constitutional: Denies any fever or chills Gastrointestinal: No reported hemesis, hematochezia, vomiting, or acute GI distress Musculoskeletal: Denies any acute onset joint swelling, redness, loss of ROM, or weakness Neurological: No reported episodes of acute onset apraxia, aphasia, dysarthria, agnosia, amnesia, paralysis, loss of coordination, or loss of consciousness  Allergies  Cynthia Bennett is allergic to ibuprofen and nsaids.  PFSH  Medical:  Cynthia Bennett  has a past medical history of Fibromyalgia; Glaucoma; Hiatal hernia; Osteoarthritis; Osteoarthritis of spine with radiculopathy, lumbar region (08/14/2015); Peptic ulcer disease; Reflux; Spinal stenosis; Stroke [facility]); and TIA (transient ischemic attack). Family: family history includes COPD in her father; Mental illness in her mother. Surgical:  has a past surgical history that includes Esophagogastroduodenoscopy (N/A, 04/16/2015); Appendectomy; and Abdominal hysterectomy. Tobacco:  reports that she has quit smoking. She does not have any smokeless tobacco history on file. Alcohol:  reports that  she does not drink alcohol. Drug:  reports that she does not use drugs.  Constitutional Exam  Vitals: Blood pressure (!) 149/77, pulse 94, temperature 98.6 F (37 C), temperature source Oral, resp. rate 18, height 5\' 3"  (1.6 m), weight 175 lb (79.4 kg), SpO2 98 %. General appearance: Well nourished, well developed, and well hydrated. In no acute distress Calculated BMI/Body habitus: Body mass index is 31 kg/m.       Psych/Mental status: Alert and oriented x 3 (person, place, & time) Eyes: PERLA Respiratory: No evidence of acute respiratory distress  Cervical Spine Exam  Inspection: No masses, redness, or swelling Alignment:  Symmetrical Functional ROM: ROM appears unrestricted Stability: No instability detected Muscle strength & Tone: Functionally intact Sensory: Unimpaired Palpation: Non-contributory  Upper Extremity (UE) Exam    Side: Right upper extremity  Side: Left upper extremity  Inspection: No masses, redness, swelling, or asymmetry  Inspection: No masses, redness, swelling, or asymmetry  Functional ROM: ROM appears unrestricted  Functional ROM: ROM appears unrestricted  Muscle strength & Tone: Functionally intact  Muscle strength & Tone: Functionally intact  Sensory: Unimpaired  Sensory: Unimpaired  Palpation: Non-contributory  Palpation: Non-contributory   Thoracic Spine Exam  Inspection: No masses, redness, or swelling Alignment: Symmetrical Functional ROM: ROM appears unrestricted Stability: No instability detected Sensory: Unimpaired Muscle strength & Tone: Functionally intact Palpation: Non-contributory  Lumbar Spine Exam  Inspection: No masses, redness, or swelling Alignment: Symmetrical Functional ROM: ROM appears unrestricted Stability: No instability detected Muscle strength & Tone: Functionally intact Sensory: Unimpaired Palpation: Non-contributory Provocative Tests: Lumbar Hyperextension and rotation test: evaluation deferred today       Patrick's Maneuver: evaluation deferred today              Gait & Posture Assessment  Ambulation: Unassisted Gait: Relatively normal for age and body habitus Posture: WNL   Lower Extremity Exam    Side: Right lower extremity  Side: Left lower extremity  Inspection: No masses, redness, swelling, or asymmetry  Inspection: No masses, redness, swelling, or asymmetry  Functional ROM: ROM appears unrestricted  Functional ROM: ROM appears unrestricted  Muscle strength & Tone: Functionally intact  Muscle strength & Tone: Functionally intact  Sensory: Unimpaired  Sensory: Unimpaired  Palpation: Non-contributory  Palpation: Non-contributory     Assessment & Plan  Primary Diagnosis & Pertinent Problem List: The primary encounter diagnosis was Chronic pain. Diagnoses of Long term current use of opiate analgesic, Opiate use, Encounter for therapeutic drug level monitoring, Chronic low back pain (Location of Primary Source of Pain) (Bilateral) (L>R), Chronic lumbar radicular pain (Location of Secondary source of pain) (Left) (L5 Dermatome), and Lumbar facet syndrome (Location of Primary Source of Pain) (Bilateral) (L>R) were also pertinent to this Bennett.  Bennett Diagnosis: 1. Chronic pain   2. Long term current use of opiate analgesic   3. Opiate use   4. Encounter for therapeutic drug level monitoring   5. Chronic low back pain (Location of Primary Source of Pain) (Bilateral) (L>R)   6. Chronic lumbar radicular pain (Location of Secondary source of pain) (Left) (L5 Dermatome)   7. Lumbar facet syndrome (Location of Primary Source of Pain) (Bilateral) (L>R)     Problems updated and reviewed during this Bennett: No problems updated.  Problem-specific Plan(s): No problem-specific Assessment & Plan notes found for this encounter.  No new Assessment & Plan notes have been filed under this hospital service since the last note was generated. Service: Pain Management   Plan of Care  Problem List Items Addressed This Bennett      High   Chronic low back pain (Location of Primary Source of Pain) (Bilateral) (L>R) (Chronic)   Relevant Medications   HYDROcodone-acetaminophen (NORCO/VICODIN) 5-325 MG tablet   HYDROcodone-acetaminophen (NORCO/VICODIN) 5-325 MG tablet   HYDROcodone-acetaminophen (NORCO/VICODIN) 5-325 MG tablet   Chronic lumbar radicular pain (Location of Secondary source of pain) (Left) (L5 Dermatome) (Chronic)   Chronic pain - Primary (Chronic)   Relevant Medications   HYDROcodone-acetaminophen (NORCO/VICODIN) 5-325 MG tablet   HYDROcodone-acetaminophen (NORCO/VICODIN) 5-325 MG tablet   HYDROcodone-acetaminophen  (NORCO/VICODIN) 5-325 MG tablet   Other Relevant Orders   Comprehensive metabolic panel (Completed)   C-reactive protein (Completed)   Magnesium (Completed)   Sedimentation rate (Completed)   Vitamin B12 (Completed)   25-Hydroxyvitamin D Lcms D2+D3   CBC (Completed)   Protime-INR (Completed)   Platelet count   Lumbar facet syndrome (Location of Primary Source of Pain) (Bilateral) (L>R) (Chronic)   Relevant Medications   HYDROcodone-acetaminophen (NORCO/VICODIN) 5-325 MG tablet   HYDROcodone-acetaminophen (NORCO/VICODIN) 5-325 MG tablet   HYDROcodone-acetaminophen (NORCO/VICODIN) 5-325 MG tablet     Medium   Encounter for therapeutic drug level monitoring   Long term current use of opiate analgesic (Chronic)   Opiate use (Chronic)    Other Bennett Diagnoses   None.      Pharmacotherapy (Medications Ordered): Meds ordered this encounter  Medications  . HYDROcodone-acetaminophen (NORCO/VICODIN) 5-325 MG tablet    Sig: Take 1 tablet by mouth every 12 (twelve) hours as needed for severe pain.    Dispense:  60 tablet    Refill:  0    Do not place this medication, or any other prescription from our practice, on "Automatic Refill". Patient may have prescription filled one day early if pharmacy is closed on scheduled refill date. Do not fill until: 07/28/16 To last until: 08/27/16  . HYDROcodone-acetaminophen (NORCO/VICODIN) 5-325 MG tablet    Sig: Take 1 tablet by mouth every 12 (twelve) hours as needed for severe pain.    Dispense:  60 tablet    Refill:  0    Do not place this medication, or any other prescription from our practice, on "Automatic Refill". Patient may have prescription filled one day early if pharmacy is closed on scheduled refill date. Do not fill until: 08/27/16 To last until: 09/26/16  . HYDROcodone-acetaminophen (NORCO/VICODIN) 5-325 MG tablet    Sig: Take 1 tablet by mouth every 12 (twelve) hours as needed for severe pain.    Dispense:  60 tablet    Refill:   0    Do not place this medication, or any other prescription from our practice, on "Automatic Refill". Patient may have prescription filled one day early if pharmacy is closed on scheduled refill date. Do not fill until: 09/26/16 To last until: 10/26/16    [facility] & Procedure Ordered: Orders Placed This Encounter  Procedures  . Comprehensive metabolic panel  . C-reactive protein  . Magnesium  . Sedimentation rate  . Vitamin B12  . 25-Hydroxyvitamin D Lcms D2+D3  . CBC  . Protime-INR  . Platelet count    Imaging Ordered: None  Interventional Therapies: Scheduled:  None at this point.    Considering:  None at this point.    PRN Procedures:  Palliative left-sided L5-S1 lumbar epidural steroid injection under fluoroscopic guidance, without IV sedation.    Referral(s) or Consult(s): None at this time.  New Prescriptions   No medications on file    Medications administered during  this Bennett: Cynthia Bennett.  Requested PM Follow-up: Return in 3 months (on 10/08/2016) for Med-Mgmt.  Future Appointments Date Time Provider Department Center  06/25/2016 11:45 AM [facility]  10/08/2016 10:00 AM Delano MetzFrancisco Cj Beecher[facility] None    Primary Care Physician: Jerl MinaJames Hedrick, MD [facility] Note by: Sydnee LevansFrancisco A. Laban EmperorNaveira, M.D, DABA, DABAPM, DABPM, DABIPP, FIPP  Pain Score Disclaimer: We use the NRS-11 scale. This is a self-reported, subjective measurement of pain severity with only modest accuracy. It is used primarily to identify changes within a particular patient. It must be understood that outpatient pain scales are significantly less accurate that those used for research, where they can be applied under ideal controlled circumstances with minimal exposure to variables. In reality, the score is likely to be a combination of pain intensity and pain affect, where pain affect  describes the degree of emotional arousal or changes in action readiness caused by the sensory experience of pain. Factors such as social and work situation, setting, emotional state, anxiety levels, expectation, and prior pain experience may influence pain perception and show large inter-individual differences that may also be affected by time variables.  Patient instructions provided at this appointment:: There are no Patient Instructions on file for this Bennett.

## 2016-06-25 ENCOUNTER — Encounter: Payer: Self-pay | Admitting: Pain Medicine

## 2016-06-25 ENCOUNTER — Ambulatory Visit
Admission: RE | Admit: 2016-06-25 | Discharge: 2016-06-25 | Disposition: A | Discharge status: Home / Self Care | Payer: PPO | Source: Ambulatory Visit | Attending: Pain Medicine | Admitting: Pain Medicine

## 2016-06-25 DIAGNOSIS — D631 Anemia in chronic kidney disease: Secondary | ICD-10-CM | POA: Insufficient documentation

## 2016-06-25 DIAGNOSIS — M5126 Other intervertebral disc displacement, lumbar region: Secondary | ICD-10-CM | POA: Insufficient documentation

## 2016-06-25 DIAGNOSIS — N183 Chronic kidney disease, stage 3 (moderate): Secondary | ICD-10-CM

## 2016-06-25 DIAGNOSIS — M48061 Spinal stenosis, lumbar region without neurogenic claudication: Secondary | ICD-10-CM

## 2016-06-25 DIAGNOSIS — M4806 Spinal stenosis, lumbar region: Secondary | ICD-10-CM | POA: Diagnosis not present

## 2016-06-25 DIAGNOSIS — N1832 Chronic kidney disease, stage 3b: Secondary | ICD-10-CM | POA: Insufficient documentation

## 2016-06-25 NOTE — Progress Notes (Signed)
While most low ALT level results indicate a normal healthy liver, that may not always be the case. A low-functioning or non-functioning liver, lacking normal levels of ALT activity to begin with, would not release a lot of ALT into the blood when damaged. People infected with the hepatitis C virus initially show high ALT levels in their blood, but these levels fall over time. Because the ALT test measures ALT levels at only one point in time, people with chronic hepatitis C infection may already have experienced the ALT peak well before blood was drawn for the ALT test. Urinary tract infections or malnutrition may also cause low blood ALT levels.  eGFR (Estimated Glomerular Filtration Rate) results are reported as milliliters/minute/1.41m (mL/min/1.754m. Because some laboratories do not collect information on a patient's race when the sample is collected for testing, they may report calculated results for both African Americans and non-African Americans.  The NaNation[facility] Systemsuggests only reporting actual results once values are < 60 mL/min. 1. Normal values: 90-120 mL/min 2. Below 60 mL/min suggests that some kidney damage has occurred. 3. Between 5940nd 30 indicate (Moderate) Stage 3 kidney disease. 4. Between 29 and 15 represent (Severe) Stage 4 kidney disease. 5. Less than 15 is considered (Kidney Failure) Stage 5.

## 2016-06-25 NOTE — Progress Notes (Signed)
Results were reviewed and found to be: abnormal, but not significant    Review would suggest the patient to be a possible candidate for interventional pain management options  Surgical consultation may be of benefit

## 2016-06-25 NOTE — Progress Notes (Signed)
Red cell distribution width (RDW) is a calculation of the variation in the size of RBCs. High - Indicates mixed population of small and large RBCs; young RBCs tend to be larger. For example, in iron deficiency anemia or pernicious anemia, there is high variation (anisocytosis) in RBC size (along with variation in shape - poikilocytosis), causing an increase in the RDW.  Mean corpuscular volume (MCV) is a measurement of the average size of a single red blood cell.  Normal levels: 80.0 - 100.0 fL Low values indicate that RBCs are smaller than normal (microcytic); caused by iron deficiency anemia or thalassemias, for example.  Mean corpuscular hemoglobin [facility]) is a calculation of the average amount of hemoglobin inside a single red blood cell. Normal Levels: 26.0 - 34.0 pg Low levels mirrors MCV results; small red cells would have a lower value. Indicates RBCs are smaller than normal (microcytic); caused by iron deficiency anemia or thalassemias, for example.

## 2016-06-29 LAB — 25-HYDROXYVITAMIN D LCMS D2+D3: 25-HYDROXY, VITAMIN D-2: 5.6 ng/mL

## 2016-06-29 LAB — 25-HYDROXY VITAMIN D LCMS D2+D3
25-Hydroxy, Vitamin D-3: 20 ng/mL
25-Hydroxy, Vitamin D: 26 ng/mL — ABNORMAL LOW

## 2016-07-20 NOTE — Progress Notes (Signed)
Low Vitamin D Results Normal levels: between 30 and 100 ng/mL. Vitamin D Insufficiency: Levels between 20-30 ng/ml are defined as a "Vitamin D insufficiency". Vitamin D Deficiency: Levels below 20 ng/ml, is diagnosed as a "Vitamin D Deficiency".  Common causes include: dietary insufficiency; inadequate sun exposure; inability to absorb vitamin D from the intestines; or inability to process it due to kidney or liver disease. Low 25-hydroxyvitamin D: A low blood level of 25-hydroxyvitamin D may mean that a person is not getting enough exposure to sunlight or enough dietary vitamin D to meet his or her body's demand or that there is a problem with its absorption from the intestines. Occasionally, drugs used to treat seizures, particularly phenytoin (Dilantin), can interfere with the production of 25-hydroxyvitamin D in the liver. There is some evidence that vitamin D deficiency may increase the risk of some cancers, immune diseases, and cardiovascular disease. Low 1,25-dihydroxyvitamin D: A low level of 1,25-dihydroxyvitamin D can be seen in kidney disease and is one of the earliest changes to occur in persons with early kidney failure. Associated complications may include: hypocalcemia, hypophosphatemia, and reduced bone density. Associated symptoms: Vitamin D deficiencies and insufficiencies may be associated with fatigue, weakness, bone pain, joint pain, and muscle pain. Recommendations: Patient may benefit from taking over-the-counter Vitamin D3 supplements. I recommend a vitamin D + Calcium supplements. "Natures Bounty", a brand easily found in most pharmacies, has a formulation containing Calcium 1200 mg plus Vitamin D3 1000 IU, in Softgels capsules that are easy to swallow. This should be taken once a day, preferably in the morning as vitamin D will increase energy levels and make it difficult to fall asleep, if taken at night. Patients with levels lower than 20 ng/ml should contact their primary care  physicians to receive replacement therapy. Vitamin D3 can be obtained over-the-counter, without a prescription. Vitamin D2 requires a prescription and it is used for replacement therapy.   

## 2016-07-28 DIAGNOSIS — H401123 Primary open-angle glaucoma, left eye, severe stage: Secondary | ICD-10-CM | POA: Diagnosis not present

## 2016-10-08 ENCOUNTER — Encounter: Payer: PPO | Admitting: Pain Medicine

## 2016-10-09 ENCOUNTER — Ambulatory Visit: Payer: PPO | Attending: Pain Medicine | Admitting: Pain Medicine

## 2016-10-09 ENCOUNTER — Encounter: Payer: Self-pay | Admitting: Pain Medicine

## 2016-10-09 VITALS — BP 142/77 | HR 108 | Resp 16 | Ht 62.5 in | Wt 170.0 lb

## 2016-10-09 DIAGNOSIS — M4316 Spondylolisthesis, lumbar region: Secondary | ICD-10-CM | POA: Diagnosis not present

## 2016-10-09 DIAGNOSIS — Z825 Family history of asthma and other chronic lower respiratory diseases: Secondary | ICD-10-CM | POA: Insufficient documentation

## 2016-10-09 DIAGNOSIS — N183 Chronic kidney disease, stage 3 (moderate): Secondary | ICD-10-CM | POA: Insufficient documentation

## 2016-10-09 DIAGNOSIS — Z79891 Long term (current) use of opiate analgesic: Secondary | ICD-10-CM | POA: Insufficient documentation

## 2016-10-09 DIAGNOSIS — G8929 Other chronic pain: Secondary | ICD-10-CM

## 2016-10-09 DIAGNOSIS — G894 Chronic pain syndrome: Secondary | ICD-10-CM | POA: Diagnosis not present

## 2016-10-09 DIAGNOSIS — M47816 Spondylosis without myelopathy or radiculopathy, lumbar region: Secondary | ICD-10-CM | POA: Insufficient documentation

## 2016-10-09 DIAGNOSIS — E785 Hyperlipidemia, unspecified: Secondary | ICD-10-CM | POA: Insufficient documentation

## 2016-10-09 DIAGNOSIS — Z79899 Other long term (current) drug therapy: Secondary | ICD-10-CM | POA: Diagnosis not present

## 2016-10-09 DIAGNOSIS — F1190 Opioid use, unspecified, uncomplicated: Secondary | ICD-10-CM | POA: Diagnosis not present

## 2016-10-09 DIAGNOSIS — M5442 Lumbago with sciatica, left side: Secondary | ICD-10-CM | POA: Diagnosis not present

## 2016-10-09 DIAGNOSIS — K219 Gastro-esophageal reflux disease without esophagitis: Secondary | ICD-10-CM | POA: Diagnosis not present

## 2016-10-09 DIAGNOSIS — Z888 Allergy status to other drugs, medicaments and biological substances status: Secondary | ICD-10-CM | POA: Diagnosis not present

## 2016-10-09 DIAGNOSIS — M797 Fibromyalgia: Secondary | ICD-10-CM | POA: Insufficient documentation

## 2016-10-09 DIAGNOSIS — M4312 Spondylolisthesis, cervical region: Secondary | ICD-10-CM | POA: Diagnosis not present

## 2016-10-09 DIAGNOSIS — M1612 Unilateral primary osteoarthritis, left hip: Secondary | ICD-10-CM | POA: Insufficient documentation

## 2016-10-09 DIAGNOSIS — M47812 Spondylosis without myelopathy or radiculopathy, cervical region: Secondary | ICD-10-CM | POA: Diagnosis not present

## 2016-10-09 DIAGNOSIS — Z818 Family history of other mental and behavioral disorders: Secondary | ICD-10-CM | POA: Diagnosis not present

## 2016-10-09 DIAGNOSIS — M48061 Spinal stenosis, lumbar region without neurogenic claudication: Secondary | ICD-10-CM | POA: Diagnosis not present

## 2016-10-09 DIAGNOSIS — Z6830 Body mass index (BMI) 30.0-30.9, adult: Secondary | ICD-10-CM | POA: Insufficient documentation

## 2016-10-09 DIAGNOSIS — Z8673 Personal history of transient ischemic attack (TIA), and cerebral infarction without residual deficits: Secondary | ICD-10-CM | POA: Insufficient documentation

## 2016-10-09 DIAGNOSIS — M545 Low back pain: Secondary | ICD-10-CM | POA: Diagnosis not present

## 2016-10-09 MED ORDER — HYDROCODONE-ACETAMINOPHEN 5-325 MG PO TABS: 1.0000 | ORAL_TABLET | Freq: Two times a day (BID) | ORAL | 0 refills | Status: DC | PRN

## 2016-10-09 NOTE — Progress Notes (Signed)
Nursing Pain Medication Assessment:  Safety precautions to be maintained throughout the outpatient stay will include: orient to surroundings, keep bed in low position, maintain call bell within reach at all times, provide assistance with transfer out of bed and ambulation.  Medication Inspection Compliance: Pill count conducted under aseptic conditions, in front of the patient. Neither the pills nor the bottle was removed from the patient's sight at any time. Once count was completed pills were immediately returned to the patient in their original bottle. Pill Count: 45 of 60 pills remain Bottle Appearance: Standard pharmacy container. Clearly labeled. Medication: See above Filled Date: 11 / 16 / 2017  Last intake 10/08/16 1 pill

## 2016-10-09 NOTE — Progress Notes (Signed)
Patient's Name: Cynthia Bennett  MRN: [id number]  Referring Provider: [name], MD  DOB: Sep 24, 1933  PCP: Cynthia Pink, MD  DOS: 10/09/2016  Note by: Cynthia Bennett. Cynthia Arbour, MD  Service setting: Ambulatory outpatient  Specialty: Interventional Pain Management  Location: [facility] (AMB) Pain Management Facility    Patient type: Established   Primary Reason(s) for Visit: Encounter for prescription drug management (Level of risk: moderate) CC: Back Pain (lower)  HPI  Cynthia Bennett is a 82 y.o. year old, female patient, who comes today for a medication management evaluation. She has Fibromyalgia; Adiposity; Current tobacco use; Bilateral low back pain with left-sided sciatica; Chronic pain syndrome; Lumbar radicular pain (Bilateral) (R>L); Encounter for therapeutic drug level monitoring; Long term current use of opiate analgesic; Long term prescription opiate use; Uncomplicated opioid dependence ([place]); Opiate use; Acid reflux; HLD (hyperlipidemia); Cannot sleep; Primary open angle glaucoma; Post menopausal syndrome; Temporary cerebral vascular dysfunction; Chronic low back pain (Location of Primary Source of Pain) (Bilateral) (L>R); Grade 1 spondylolisthesis of L4 over L5; Severe (5-6 mm) L4-5 lumbar spinal stenosis and (8 mm) L3-4 spinal stenosis.; Lumbar discogenic pain syndrome; Lumbar facet syndrome (Location of Primary Source of Pain) (Bilateral) (L>R); Lumbar facet hypertrophy (Bilateral); Chronic sacroiliac joint pain (Left); Chronic hip pain (Left); Peptic ulcer disease; Morbid obesity ([place]); GERD (gastroesophageal reflux disease); Encounter for chronic pain management; Opiate analgesic contract exists ([facility]); Lumbar spondylosis; Abnormal MRI, lumbar spine (03/20/2000); Cervical spondylosis (Anterolisthesis of C5 over C6); Difficulty voiding; Neurogenic bladder; Chronic lower extremity pain (Location of Secondary source of pain) (Left); Chronic lumbar radicular pain  (Location of Secondary source of pain) (Left) (L5 Dermatome); Osteoarthritis of hip (Left); Chronic hip pain (Bilateral) (R>L); Chronic sacroiliac joint pain (Bilateral) (R>L); and Kidney disease, chronic, stage III (GFR 30-59 ml/min) on her problem list. Her primarily concern today is the Back Pain (lower)  Pain Assessment: Self-Reported Pain Score: 2 /10             Reported level is compatible with observation.       Pain Type: Chronic pain Pain Location: Back Pain Orientation: Lower, Right Pain Descriptors / Indicators: Discomfort, Aching Pain Frequency: Constant  Cynthia Bennett was last seen on 06/24/2016 for medication management. During today's appointment we reviewed Cynthia Bennett's chronic pain status, as well as her outpatient medication regimen.  The patient  reports that she does not use drugs. Her body mass index is 30.6 kg/m.  Further details on both, my assessment(s), as well as the proposed treatment plan, please see below.  Controlled Substance Pharmacotherapy Assessment REMS (Risk Evaluation and Mitigation Strategy)  Analgesic:Hydrocodone/APAP 5/325 one tablet every 12 hours (10 mg/day) MME/day:10 mg/day  Cynthia Specking, RN  10/09/2016 10:19 AM  Sign at close encounter Nursing Pain Medication Assessment:  Safety precautions to be maintained throughout the outpatient stay will include: orient to surroundings, keep bed in low position, maintain call bell within reach at all times, provide assistance with transfer out of bed and ambulation.  Medication Inspection Compliance: Pill count conducted under aseptic conditions, in front of the patient. Neither the pills nor the bottle was removed from the patient's sight at any time. Once count was completed pills were immediately returned to the patient in their original bottle. Pill Count: 45 of 60 pills remain Bottle Appearance: Standard pharmacy container. Clearly labeled. Medication: See above Filled Date: 11 / 16 / 2017  Last  intake 10/08/16 1 pill   Pharmacokinetics: Liberation and absorption (onset of action):  WNL Distribution (time to peak effect): WNL Metabolism and excretion (duration of action): WNL         Pharmacodynamics: Desired effects: Analgesia: Cynthia Bennett reports >50% benefit. Functional ability: Patient reports that medication allows her to accomplish basic ADLs Clinically meaningful improvement in function (CMIF): Sustained CMIF goals met Perceived effectiveness: Described as relatively effective, allowing for increase in activities of daily living (ADL) Undesirable effects: Side-effects or Adverse reactions: None reported Monitoring: [place] PMP: Online review of the past 61-monthperiod conducted. Compliant with practice rules and regulations List of all UDS test(s) done:  Lab Results  Component Value Date   TOXASSSELUR FINAL 01/23/2016   Last UDS on record: [employer]ox[employer] Select 13  Date Value Ref Range Status  01/23/2016 FINAL  Final    Comment:    ==================================================================== TOXASSURE SELECT 13 (MW) ==================================================================== Test                             Result       Flag       Units Drug Present and Declared for Prescription Verification   Hydrocodone                    1076         EXPECTED   ng/mg creat   Dihydrocodeine                 190          EXPECTED   ng/mg creat   Norhydrocodone                 1510         EXPECTED   ng/mg creat    Sources of hydrocodone include scheduled prescription    medications. Dihydrocodeine and norhydrocodone are expected    metabolites of hydrocodone. Dihydrocodeine is also available as a    scheduled prescription medication. ==================================================================== Test                      Result    Flag   Units      Ref Range   Creatinine              58               mg/dL       >=20 ==================================================================== Declared Medications:  The flagging and interpretation on this report are based on the  following declared medications.  Unexpected results may arise from  inaccuracies in the declared medications.  **Note: The testing scope of this panel includes these medications:  Hydrocodone (Norco)  Hydrocodone (Vicodin)  **Note: The testing scope of this panel does not include following  reported medications:  Acetaminophen (Norco)  Acetaminophen (Vicodin)  Bupropion (Wellbutrin)  Dorzolamide  Doxepin  Multivitamin  Omeprazole (Prilosec)  Pantoprazole (Protonix)  Sertraline (Zoloft)  Zolpidem (Ambien) ==================================================================== For clinical consultation, please call [phone] ====================================================================    UDS interpretation: Compliant          Medication Assessment Form: Reviewed. Patient indicates being compliant with therapy Treatment compliance: Compliant Risk Assessment Profile: Aberrant behavior: See prior evaluations. None observed or detected today Comorbid factors increasing risk of overdose: See prior notes. No additional risks detected today Risk of substance use disorder (SUD): Low Opioid Risk Tool (ORT) Total Score: 0  Interpretation Table:  Score <3 = Low Risk for SUD  Score between 4-7 = Moderate Risk for SUD  Score >8 = High Risk for Opioid Abuse   Risk Mitigation Strategies:  Patient Counseling: Covered Patient-Prescriber Agreement (PPA): Present and active  Notification to other healthcare providers: Done  Pharmacologic Plan: No change in therapy, at this time  Laboratory Chemistry  Inflammation Markers Lab Results  Component Value Date   ESRSEDRATE 23 06/24/2016   CRP 0.6 06/24/2016   Renal Function Lab Results  Component Value Date   BUN 13 06/24/2016   CREATININE 0.95 06/24/2016   GFRAA  >60 06/24/2016   GFRNONAA 55 (L) 06/24/2016   Hepatic Function Lab Results  Component Value Date   AST 17 06/24/2016   ALT 11 (L) 06/24/2016   ALBUMIN 3.9 06/24/2016   Electrolytes Lab Results  Component Value Date   NA 137 06/24/2016   K 4.3 06/24/2016   CL 107 06/24/2016   CALCIUM 9.1 06/24/2016   MG 2.2 06/24/2016   Pain Modulating Vitamins Lab Results  Component Value Date   25OHVITD1 26 (L) 06/24/2016   25OHVITD2 5.6 06/24/2016   25OHVITD3 20 06/24/2016   VITAMINB12 240 06/24/2016   Coagulation Parameters Lab Results  Component Value Date   INR 0.96 06/24/2016   LABPROT 12.8 06/24/2016   PLT 291 06/24/2016   Cardiovascular Lab Results  Component Value Date   HGB 12.7 06/24/2016   HCT 38.6 06/24/2016   Note: Lab results reviewed.  Recent Diagnostic Imaging Review  Mr Lumbar Spine Wo Contrast  Result Date: 06/25/2016 CLINICAL DATA:  Chronic low back pain, bilateral radicular pain and severe spinal stenosis EXAM: MRI LUMBAR SPINE WITHOUT CONTRAST TECHNIQUE: Multiplanar, multisequence MR imaging of the lumbar spine was performed. No intravenous contrast was administered. COMPARISON:  07/04/2010 FINDINGS: Segmentation:  Standard. Alignment: Levoscoliosis of the lumbar spine. 3 mm of anterolisthesis of L4 on L5. Vertebrae:  No fracture, evidence of discitis, or bone lesion. Conus medullaris: Extends to the T12-L1 level and appears normal. Paraspinal and other soft tissues: No focal paraspinal abnormality. Disc levels: Disc spaces: Degenerative disc disease with disc height loss at L2-3, L3-4 and L4-5. T12-L1: Mild broad-based disc bulge. No evidence of neural foraminal stenosis. No central canal stenosis. L1-L2: Mild broad-based disc bulge. Mild bilateral facet arthropathy. Mild right lateral recess stenosis. No evidence of neural foraminal stenosis. No central canal stenosis. L2-L3: Mild broad-based disc bulge. Moderate right and mild left facet arthropathy. No evidence  of neural foraminal stenosis. Mild spinal stenosis. L3-L4: Mild broad-based disc bulge. Moderate bilateral facet arthropathy. Mild spinal stenosis and right lateral recess stenosis. No evidence of neural foraminal stenosis. L4-L5: Moderate broad-based disc bulge. Severe bilateral facet arthropathy with ligamentum flavum infolding resulting in severe left lateral recess stenosis and moderate-severe spinal stenosis. No evidence of neural foraminal stenosis. L5-S1: No significant disc bulge. Mild bilateral facet arthropathy. No evidence of neural foraminal stenosis. No central canal stenosis. IMPRESSION: 1. At L4-5 there is a moderate broad-based disc bulge. Severe bilateral facet arthropathy with ligamentum flavum infolding resulting in severe left lateral recess stenosis and moderate-severe spinal stenosis. 2. At L3-4 there is a mild broad-based disc bulge. Moderate bilateral facet arthropathy. Mild spinal stenosis and right lateral recess stenosis. 3. At L2-3 there is a mild broad-based disc bulge. Mild bilateral facet arthropathy. Mild right lateral recess stenosis. 4. Overall, there is no significant change compared with the prior exam of 02/01/2010. [place] Electronically Signed   By: Kathreen Devoid   On: 06/25/2016 13:22   Note: Imaging results reviewed.  Meds  The patient has a current medication  list which includes the following prescription(s): bupropion, dorzolamide-timolol, doxepin, hydrocodone-acetaminophen, multi-vitamins, pantoprazole, sertraline, and zolpidem.  Current Outpatient Prescriptions on File Prior to Visit  Medication Sig  . buPROPion (WELLBUTRIN XL) 150 MG 24 hr tablet Take 150 mg by mouth daily.  . dorzolamide-timolol (COSOPT) 22.3-6.8 MG/ML ophthalmic solution INT 1 GTT IN OU BID  . doxepin (SINEQUAN) 100 MG capsule Take 100 mg by mouth at bedtime.  . Multiple Vitamin (MULTI-VITAMINS) TABS Take 1 tablet by mouth daily.   . pantoprazole (PROTONIX) 40 MG tablet Take 40 mg by mouth  daily.   . sertraline (ZOLOFT) 100 MG tablet Take 100 mg by mouth daily.  Marland Kitchen zolpidem (AMBIEN) 10 MG tablet Take 10 mg by mouth at bedtime.    No current facility-administered medications on file prior to visit.    ROS  Constitutional: Denies any fever or chills Gastrointestinal: No reported hemesis, hematochezia, vomiting, or acute GI distress Musculoskeletal: Denies any acute onset joint swelling, redness, loss of ROM, or weakness Neurological: No reported episodes of acute onset apraxia, aphasia, dysarthria, agnosia, amnesia, paralysis, loss of coordination, or loss of consciousness  Allergies  Ms. Eastridge is allergic to ibuprofen and nsaids.  PFSH  Drug: Ms. Aller  reports that she does not use drugs. Alcohol:  reports that she does not drink alcohol. Tobacco:  reports that she has quit smoking. She does not have any smokeless tobacco history on file. Medical:  has a past medical history of Fibromyalgia; Glaucoma; Hiatal hernia; Osteoarthritis; Osteoarthritis of spine with radiculopathy, lumbar region (08/14/2015); Peptic ulcer disease; Reflux; Spinal stenosis; Stroke [facility]); and TIA (transient ischemic attack). Family: family history includes COPD in her father; Mental illness in her mother.  Past Surgical History:  Procedure Laterality Date  . ABDOMINAL HYSTERECTOMY    . APPENDECTOMY    . ESOPHAGOGASTRODUODENOSCOPY N/A 04/16/2015   Procedure: ESOPHAGOGASTRODUODENOSCOPY (EGD);  Surgeon: Hulen Luster, MD;  Location: [facility] ENDOSCOPY;  Service: Gastroenterology;  Laterality: N/A;   Constitutional Exam  General appearance: Well nourished, well developed, and well hydrated. In no apparent acute distress Vitals:   10/09/16 1004 10/09/16 1009  BP:  (!) 142/77  Pulse: (!) 108   Resp: 16   SpO2: 98%   Weight: 170 lb (77.1 kg)   Height: 5' 2.5" (1.588 m)    BMI Assessment: Estimated body mass index is 30.6 kg/m as calculated from the following:   Height as of this encounter: 5' 2.5" (1.588  m).   Weight as of this encounter: 170 lb (77.1 kg).  BMI interpretation table: BMI level Category Range association with higher incidence of chronic pain  <18 kg/m2 Underweight   18.5-24.9 kg/m2 Ideal body weight   25-29.9 kg/m2 Overweight Increased incidence by 20%  30-34.9 kg/m2 Obese (Class I) Increased incidence by 68%  35-39.9 kg/m2 Severe obesity (Class II) Increased incidence by 136%  >40 kg/m2 Extreme obesity (Class III) Increased incidence by 254%   BMI Readings from Last 4 Encounters:  10/09/16 30.60 kg/m  06/24/16 31.00 kg/m  05/29/16 31.89 kg/m  04/29/16 31.00 kg/m   Wt Readings from Last 4 Encounters:  10/09/16 170 lb (77.1 kg)  06/24/16 175 lb (79.4 kg)  05/29/16 180 lb (81.6 kg)  04/29/16 175 lb (79.4 kg)  Psych/Mental status: Alert, oriented x 3 (person, place, & time) Eyes: PERLA Respiratory: No evidence of acute respiratory distress  Cervical Spine Exam  Inspection: No masses, redness, or swelling Alignment: Symmetrical Functional ROM: Unrestricted ROM Stability: No instability detected Muscle strength &  Tone: Functionally intact Sensory: Unimpaired Palpation: Non-contributory  Upper Extremity (UE) Exam    Side: Right upper extremity  Side: Left upper extremity  Inspection: No masses, redness, swelling, or asymmetry  Inspection: No masses, redness, swelling, or asymmetry  Functional ROM: Unrestricted ROM          Functional ROM: Unrestricted ROM          Muscle strength & Tone: Functionally intact  Muscle strength & Tone: Functionally intact  Sensory: Unimpaired  Sensory: Unimpaired  Palpation: Non-contributory  Palpation: Non-contributory   Thoracic Spine Exam  Inspection: No masses, redness, or swelling Alignment: Symmetrical Functional ROM: Unrestricted ROM Stability: No instability detected Sensory: Unimpaired Muscle strength & Tone: Functionally intact Palpation: Non-contributory  Lumbar Spine Exam  Inspection: No masses, redness, or  swelling Alignment: Symmetrical Functional ROM: Unrestricted ROM Stability: No instability detected Muscle strength & Tone: Functionally intact Sensory: Unimpaired Palpation: Non-contributory Provocative Tests: Lumbar Hyperextension and rotation test: evaluation deferred today       Patrick's Maneuver: evaluation deferred today              Gait & Posture Assessment  Ambulation: Unassisted Gait: Relatively normal for age and body habitus Posture: WNL   Lower Extremity Exam    Side: Right lower extremity  Side: Left lower extremity  Inspection: No masses, redness, swelling, or asymmetry  Inspection: No masses, redness, swelling, or asymmetry  Functional ROM: Unrestricted ROM          Functional ROM: Unrestricted ROM          Muscle strength & Tone: Functionally intact  Muscle strength & Tone: Functionally intact  Sensory: Unimpaired  Sensory: Unimpaired  Palpation: Non-contributory  Palpation: Non-contributory   Assessment  Primary Diagnosis & Pertinent Problem List: The primary encounter diagnosis was Chronic pain syndrome. Diagnoses of Chronic low back pain (Location of Primary Source of Pain) (Bilateral) (L>R), Opiate use, and Long term prescription opiate use were also pertinent to this visit.  Visit Diagnosis: 1. Chronic pain syndrome   2. Chronic low back pain (Location of Primary Source of Pain) (Bilateral) (L>R)   3. Opiate use   4. Long term prescription opiate use    Plan of Care  Pharmacotherapy (Medications Ordered): Meds ordered this encounter  Medications  . DISCONTD: HYDROcodone-acetaminophen (NORCO/VICODIN) 5-325 MG tablet    Sig: Take 1 tablet by mouth every 12 (twelve) hours as needed for severe pain.    Dispense:  60 tablet    Refill:  0    Do not place this medication, or any other prescription from our practice, on "Automatic Refill". Patient may have prescription filled one day early if pharmacy is closed on scheduled refill date. Do not fill until:  10/09/16 To last until: 11/08/16  . HYDROcodone-acetaminophen (NORCO/VICODIN) 5-325 MG tablet    Sig: Take 1 tablet by mouth every 12 (twelve) hours as needed for severe pain.    Dispense:  60 tablet    Refill:  0    Do not place this medication, or any other prescription from our practice, on "Automatic Refill". Patient may have prescription filled one day early if pharmacy is closed on scheduled refill date. Do not fill until: 10/09/16 To last until: 11/08/16   New Prescriptions   No medications on file   Medications administered today: Ms. Cardin had no medications administered during this visit. Lab-work, procedure(s), and/or referral(s): No orders of the defined types were placed in this encounter.  Imaging and/or referral(s): None  Interventional therapies:  Planned, scheduled, and/or pending:   None at this time    Considering:   Palliative left-sided L5-S1 lumbar epidural steroid injection under fluoroscopic guidance, without IV sedation.    Palliative PRN treatment(s):   Palliative left-sided L5-S1 lumbar epidural steroid injection under fluoroscopic guidance, without IV sedation.    Provider-requested follow-up: Return in about 3 months (around 01/07/2017) for Med-Mgmt, in addition, (PRN) procedure.  Future Appointments Date Time Provider [place]  01/01/2017 10:15 AM Milinda Pointer, MD [facility] None   Primary Care Physician: Cynthia Pink, MD Location: [facility] Outpatient Pain Management Facility Note by: Akhilesh Sassone A. Cynthia Bennett, M.D, DABA, DABAPM, DABPM, DABIPP, FIPP  Pain Score Disclaimer: We use the NRS-11 scale. This is a self-reported, subjective measurement of pain severity with only modest accuracy. It is used primarily to identify changes within a particular patient. It must be understood that outpatient pain scales are significantly less accurate that those used for research, where they can be applied under ideal controlled circumstances with minimal  exposure to variables. In reality, the score is likely to be a combination of pain intensity and pain affect, where pain affect describes the degree of emotional arousal or changes in action readiness caused by the sensory experience of pain. Factors such as social and work situation, setting, emotional state, anxiety levels, expectation, and prior pain experience may influence pain perception and show large inter-individual differences that may also be affected by time variables.  Patient instructions provided during this appointment: There are no Patient Instructions on file for this visit.

## 2016-12-03 ENCOUNTER — Other Ambulatory Visit: Payer: Self-pay | Admitting: Family Medicine

## 2016-12-03 DIAGNOSIS — I739 Peripheral vascular disease, unspecified: Secondary | ICD-10-CM | POA: Diagnosis not present

## 2016-12-03 DIAGNOSIS — F5104 Psychophysiologic insomnia: Secondary | ICD-10-CM | POA: Diagnosis not present

## 2016-12-03 DIAGNOSIS — R69 Illness, unspecified: Secondary | ICD-10-CM | POA: Diagnosis not present

## 2016-12-03 DIAGNOSIS — K219 Gastro-esophageal reflux disease without esophagitis: Secondary | ICD-10-CM | POA: Diagnosis not present

## 2016-12-03 DIAGNOSIS — Z8659 Personal history of other mental and behavioral disorders: Secondary | ICD-10-CM | POA: Diagnosis not present

## 2016-12-08 ENCOUNTER — Ambulatory Visit
Admission: RE | Admit: 2016-12-08 | Discharge: 2016-12-08 | Disposition: A | Discharge status: Home / Self Care | Payer: PPO | Source: Ambulatory Visit | Attending: Family Medicine | Admitting: Family Medicine

## 2016-12-08 DIAGNOSIS — I6523 Occlusion and stenosis of bilateral carotid arteries: Secondary | ICD-10-CM | POA: Insufficient documentation

## 2016-12-08 DIAGNOSIS — I739 Peripheral vascular disease, unspecified: Secondary | ICD-10-CM

## 2016-12-08 DIAGNOSIS — I63233 Cerebral infarction due to unspecified occlusion or stenosis of bilateral carotid arteries: Secondary | ICD-10-CM | POA: Diagnosis not present

## 2017-01-01 ENCOUNTER — Ambulatory Visit: Payer: PPO | Attending: Pain Medicine | Admitting: Pain Medicine

## 2017-01-01 ENCOUNTER — Encounter: Payer: Self-pay | Admitting: Pain Medicine

## 2017-01-01 VITALS — BP 87/74 | HR 89 | Temp 98.2°F | Resp 16 | Ht 63.0 in | Wt 175.0 lb

## 2017-01-01 DIAGNOSIS — M1612 Unilateral primary osteoarthritis, left hip: Secondary | ICD-10-CM | POA: Diagnosis not present

## 2017-01-01 DIAGNOSIS — G894 Chronic pain syndrome: Secondary | ICD-10-CM | POA: Insufficient documentation

## 2017-01-01 DIAGNOSIS — Z79891 Long term (current) use of opiate analgesic: Secondary | ICD-10-CM | POA: Insufficient documentation

## 2017-01-01 DIAGNOSIS — M48061 Spinal stenosis, lumbar region without neurogenic claudication: Secondary | ICD-10-CM | POA: Diagnosis not present

## 2017-01-01 DIAGNOSIS — M1288 Other specific arthropathies, not elsewhere classified, other specified site: Secondary | ICD-10-CM | POA: Diagnosis not present

## 2017-01-01 DIAGNOSIS — K449 Diaphragmatic hernia without obstruction or gangrene: Secondary | ICD-10-CM | POA: Insufficient documentation

## 2017-01-01 DIAGNOSIS — M47816 Spondylosis without myelopathy or radiculopathy, lumbar region: Secondary | ICD-10-CM

## 2017-01-01 DIAGNOSIS — M797 Fibromyalgia: Secondary | ICD-10-CM | POA: Diagnosis not present

## 2017-01-01 DIAGNOSIS — Z6831 Body mass index (BMI) 31.0-31.9, adult: Secondary | ICD-10-CM | POA: Insufficient documentation

## 2017-01-01 DIAGNOSIS — K219 Gastro-esophageal reflux disease without esophagitis: Secondary | ICD-10-CM | POA: Insufficient documentation

## 2017-01-01 DIAGNOSIS — Z8673 Personal history of transient ischemic attack (TIA), and cerebral infarction without residual deficits: Secondary | ICD-10-CM | POA: Diagnosis not present

## 2017-01-01 DIAGNOSIS — G8929 Other chronic pain: Secondary | ICD-10-CM

## 2017-01-01 DIAGNOSIS — N183 Chronic kidney disease, stage 3 (moderate): Secondary | ICD-10-CM | POA: Diagnosis not present

## 2017-01-01 DIAGNOSIS — M545 Low back pain: Secondary | ICD-10-CM

## 2017-01-01 DIAGNOSIS — M79605 Pain in left leg: Secondary | ICD-10-CM

## 2017-01-01 DIAGNOSIS — F1190 Opioid use, unspecified, uncomplicated: Secondary | ICD-10-CM

## 2017-01-01 DIAGNOSIS — E785 Hyperlipidemia, unspecified: Secondary | ICD-10-CM | POA: Diagnosis not present

## 2017-01-01 DIAGNOSIS — Z886 Allergy status to analgesic agent status: Secondary | ICD-10-CM | POA: Insufficient documentation

## 2017-01-01 DIAGNOSIS — M5416 Radiculopathy, lumbar region: Secondary | ICD-10-CM | POA: Diagnosis not present

## 2017-01-01 MED ORDER — HYDROCODONE-ACETAMINOPHEN 5-325 MG PO TABS: 1.0000 | ORAL_TABLET | Freq: Two times a day (BID) | ORAL | 0 refills | Status: DC | PRN

## 2017-01-01 NOTE — Patient Instructions (Signed)

## 2017-01-01 NOTE — Progress Notes (Signed)
Patient's Name: Cynthia Bennett  MRN: [id number]  Referring Provider: [name], MD  DOB: Nov 29, 1932  PCP: Cynthia Pink, MD  DOS: 01/01/2017  Note by: Cynthia Bennett. Cynthia Arbour, MD  Service setting: Ambulatory outpatient  Specialty: Interventional Pain Management  Location: [facility] (AMB) Pain Management Facility    Patient type: Established   Primary Reason(s) for Visit: Encounter for prescription drug management (Level of risk: moderate) CC: Back Pain (lower)  HPI  [name] is a 83 y.o. year old, female patient, who comes today for a medication management evaluation. She has Fibromyalgia; Adiposity; Current tobacco use; Bilateral low back pain with left-sided sciatica; Chronic pain syndrome; Lumbar radicular pain (Bilateral) (R>L); Encounter for therapeutic drug level monitoring; Long term current use of opiate analgesic; Long term prescription opiate use; Uncomplicated opioid dependence (Cynthia Bennett); Opiate use; Acid reflux; HLD (hyperlipidemia); Cannot sleep; Primary open angle glaucoma; Post menopausal syndrome; Temporary cerebral vascular dysfunction; Chronic low back pain (Location of Primary Source of Pain) (Bilateral) (L>R); Grade 1 spondylolisthesis of L4 over L5; Severe (5-6 mm) L4-5 lumbar spinal stenosis and (8 mm) L3-4 spinal stenosis.; Lumbar discogenic pain syndrome; Lumbar facet syndrome (Location of Primary Source of Pain) (Bilateral) (L>R); Lumbar facet hypertrophy (Bilateral); Chronic sacroiliac joint pain (Left); Chronic hip pain (Left); Peptic ulcer disease; Morbid obesity ([place]); GERD (gastroesophageal reflux disease); Encounter for chronic pain management; Opiate analgesic contract exists ([place]); Lumbar spondylosis; Abnormal MRI, lumbar spine (03/20/2000); Cervical spondylosis (Anterolisthesis of C5 over C6); Difficulty voiding; Neurogenic bladder; Chronic lower extremity pain (Location of Secondary source of pain) (Left); Chronic lumbar radicular pain  (Location of Secondary source of pain) (Left) (L5 Dermatome); Osteoarthritis of hip (Left); Chronic hip pain (Bilateral) (R>L); Chronic sacroiliac joint pain (Bilateral) (R>L); and Kidney disease, chronic, stage III (GFR 30-59 ml/min) on her problem list. Her primarily concern today is the Back Pain (lower)  Pain Assessment: Self-Reported Pain Score: 4 /10             Reported level is compatible with observation.       Pain Type: Chronic pain Pain Location: Back Pain Orientation: Lower Pain Descriptors / Indicators: Aching, Burning Pain Frequency: Constant  [name] was last scheduled for an appointment on 10/09/2016 for medication management. During today's appointment we reviewed [name]'s chronic pain status, as well as her outpatient medication regimen.  The patient  reports that she does not use drugs. Her body mass index is 31 kg/m.  Further details on both, my assessment(s), as well as the proposed treatment plan, please see below.  Controlled Substance Pharmacotherapy Assessment REMS (Risk Evaluation and Mitigation Strategy)  Analgesic:Hydrocodone/APAP 5/325 One and a half tablets per day (7.5 mg/day) MME/day:7.22m/day  KZenovia Jarred RN  01/01/2017 11:07 AM  Signed Patient complains of pain in low back.  Denies allergies to essential oils.  Educated on [employer]  Patient states she would love to try inhaling the oil.  Gave patient medicine cup with 1 drop of 50% diluted Lavender oil for inhalation.  Patient states she enjoyed the lavender.  Denies any reaction.  DLandis Martins RN  01/01/2017 10:46 AM  Sign at close encounter Nursing Pain Medication Assessment:  Safety precautions to be maintained throughout the outpatient stay will include: orient to surroundings, keep bed in low position, maintain call bell within reach at all times, provide assistance with transfer out of bed and ambulation.  Medication Inspection Compliance: Pill count conducted under aseptic conditions,  in front of the patient.  Neither the pills nor the bottle was removed from the patient's sight at any time. Once count was completed pills were immediately returned to the patient in their original bottle.  Medication: Hydrocodone/APAP Pill/Patch Count: 22 of 60 pills remain Bottle Appearance: Standard pharmacy container. Clearly labeled. Filled Date: 01 /24/ 2018 Last Medication intake:  Yesterday   Pharmacokinetics: Liberation and absorption (onset of action): WNL Distribution (time to peak effect): WNL Metabolism and excretion (duration of action): WNL         Pharmacodynamics: Desired effects: Analgesia: [name] reports >50% benefit. Functional ability: Patient reports that medication allows her to accomplish basic ADLs Clinically meaningful improvement in function (CMIF): Sustained CMIF goals met Perceived effectiveness: Described as relatively effective, allowing for increase in activities of daily living (ADL) Undesirable effects: Side-effects or Adverse reactions: None reported Monitoring: [place] PMP: Online review of the past 2-monthperiod conducted. Compliant with practice rules and regulations List of all UDS test(s) done:  Lab Results  Component Value Date   TOXASSSELUR FINAL 01/23/2016   Last UDS on record: ToxAssure Select 13  Date Value Ref Range Status  01/23/2016 FINAL  Final    Comment:    ==================================================================== TOXASSURE SELECT 13 (MW) ==================================================================== Test                             Result       Flag       Units Drug Present and Declared for Prescription Verification   Hydrocodone                    1076         EXPECTED   ng/mg creat   Dihydrocodeine                 190          EXPECTED   ng/mg creat   Norhydrocodone                 1510         EXPECTED   ng/mg creat    Sources of hydrocodone include scheduled prescription    medications. Dihydrocodeine and  norhydrocodone are expected    metabolites of hydrocodone. Dihydrocodeine is also available as a    scheduled prescription medication. ==================================================================== Test                      Result    Flag   Units      Ref Range   Creatinine              58               mg/dL      >=20 ==================================================================== Declared Medications:  The flagging and interpretation on this report are based on the  following declared medications.  Unexpected results may arise from  inaccuracies in the declared medications.  **Note: The testing scope of this panel includes these medications:  Hydrocodone (Norco)  Hydrocodone (Vicodin)  **Note: The testing scope of this panel does not include following  reported medications:  Acetaminophen (Norco)  Acetaminophen (Vicodin)  Bupropion (Wellbutrin)  Dorzolamide  Doxepin  Multivitamin  Omeprazole (Prilosec)  Pantoprazole (Protonix)  Sertraline (Zoloft)  Zolpidem (Ambien) ==================================================================== For clinical consultation, please call [phone] ====================================================================    UDS interpretation: Compliant          Medication Assessment Form: Reviewed. Patient indicates being compliant with  therapy Treatment compliance: Compliant Risk Assessment Profile: Aberrant behavior: See prior evaluations. None observed or detected today Comorbid factors increasing risk of overdose: See prior notes. No additional risks detected today Risk of substance use disorder (SUD): Low Opioid Risk Tool (ORT) Total Score: 0  Interpretation Table:  Score <3 = Low Risk for SUD  Score between 4-7 = Moderate Risk for SUD  Score >8 = High Risk for Opioid Abuse   Risk Mitigation Strategies:  Patient Counseling: Covered Patient-Prescriber Agreement (PPA): Present and active  Notification to other  healthcare providers: Done  Pharmacologic Plan: No change in therapy, at this time  Laboratory Chemistry  Inflammation Markers Lab Results  Component Value Date   ESRSEDRATE 23 06/24/2016   CRP 0.6 06/24/2016   Renal Function Lab Results  Component Value Date   BUN 13 06/24/2016   CREATININE 0.95 06/24/2016   GFRAA >60 06/24/2016   GFRNONAA 55 (L) 06/24/2016   Hepatic Function Lab Results  Component Value Date   AST 17 06/24/2016   ALT 11 (L) 06/24/2016   ALBUMIN 3.9 06/24/2016   Electrolytes Lab Results  Component Value Date   NA 137 06/24/2016   K 4.3 06/24/2016   CL 107 06/24/2016   CALCIUM 9.1 06/24/2016   MG 2.2 06/24/2016   Pain Modulating Vitamins Lab Results  Component Value Date   25OHVITD1 26 (L) 06/24/2016   25OHVITD2 5.6 06/24/2016   25OHVITD3 20 06/24/2016   VITAMINB12 240 06/24/2016   Coagulation Parameters Lab Results  Component Value Date   INR 0.96 06/24/2016   LABPROT 12.8 06/24/2016   PLT 291 06/24/2016   Cardiovascular Lab Results  Component Value Date   HGB 12.7 06/24/2016   HCT 38.6 06/24/2016   Note: Lab results reviewed.  Recent Diagnostic Imaging Review  US Carotid Bilateral  Result Date: 12/08/2016 CLINICAL DATA:  PVD. EXAM: BILATERAL CAROTID DUPLEX ULTRASOUND TECHNIQUE: Pearline Cables scale imaging, color Doppler and duplex ultrasound were performed of bilateral carotid and vertebral arteries in the neck. COMPARISON:  No recent prior. FINDINGS: Criteria: Quantification of carotid stenosis is based on velocity parameters that correlate the residual internal carotid diameter with NASCET-based stenosis levels, using the diameter of the distal internal carotid lumen as the denominator for stenosis measurement. The following velocity measurements were obtained: RIGHT ICA:  52/13 cm/sec CCA:  16/10 cm/sec SYSTOLIC ICA/CCA RATIO:  0.6 DIASTOLIC ICA/CCA RATIO:  0.7 ECA:  70 cm/sec LEFT ICA:  195/72 cm/sec CCA:  96/04 cm/sec SYSTOLIC ICA/CCA RATIO:   2.8 DIASTOLIC ICA/CCA RATIO:  4.4 ECA:  119 cm/sec RIGHT CAROTID ARTERY: Mild diffuse intimal thickening right common carotid artery and right proximal bifurcation. No flow limiting stenosis. Degree of stenosis less than 50% RIGHT VERTEBRAL ARTERY:  Patent with antegrade flow. LEFT CAROTID ARTERY: Mild diffuse common carotid artery atherosclerotic vascular disease. Moderate atherosclerotic plaque left carotid bifurcation and proximal ICA. Visually degree of stenosis in the 50-69% range. LEFT VERTEBRAL ARTERY:  Patent with antegrade flow. IMPRESSION: 1. Mild diffuse left common carotid atherosclerotic vascular disease. Moderate atherosclerotic vascular plaque noted at the left carotid bifurcation and proximal ICA. Visually the degree of stenosis in the 50-69% range. Degree of stenosis is most likely in the upper end of this range. Close follow-up suggested. 2. Mild right common carotid artery intimal thickening. No flow limiting stenosis on the right. Degree of stenosis less 50%. 3. Vertebral arteries are patent antegrade flow. Electronically Signed   By: Marcello Moores  Register   On: 12/08/2016 15:00   Note: Imaging  results reviewed.          Meds  The patient has a current medication list which includes the following prescription(s): bupropion, dorzolamide-timolol, doxepin, hydrocodone-acetaminophen, hydrocodone-acetaminophen, hydrocodone-acetaminophen, multi-vitamins, pantoprazole, and zolpidem.  Current Outpatient Prescriptions on File Prior to Visit  Medication Sig  . dorzolamide-timolol (COSOPT) 22.3-6.8 MG/ML ophthalmic solution INT 1 GTT IN OU BID  . doxepin (SINEQUAN) 100 MG capsule Take 100 mg by mouth at bedtime.  . Multiple Vitamin (MULTI-VITAMINS) TABS Take 1 tablet by mouth daily.   . pantoprazole (PROTONIX) 40 MG tablet Take 40 mg by mouth daily.    No current facility-administered medications on file prior to visit.    ROS  Constitutional: Denies any fever or chills Gastrointestinal: No  reported hemesis, hematochezia, vomiting, or acute GI distress Musculoskeletal: Denies any acute onset joint swelling, redness, loss of ROM, or weakness Neurological: No reported episodes of acute onset apraxia, aphasia, dysarthria, agnosia, amnesia, paralysis, loss of coordination, or loss of consciousness  Allergies  Ms. Soucy is allergic to ibuprofen and nsaids.  PFSH  Drug: Ms. Holben  reports that she does not use drugs. Alcohol:  reports that she does not drink alcohol. Tobacco:  reports that she has quit smoking. She has never used smokeless tobacco. Medical:  has a past medical history of Fibromyalgia; Glaucoma; Hiatal hernia; Osteoarthritis; Osteoarthritis of spine with radiculopathy, lumbar region (08/14/2015); Peptic ulcer disease; Reflux; Spinal stenosis; Stroke [facility]); and TIA (transient ischemic attack). Family: family history includes COPD in her father; Mental illness in her mother.  Past Surgical History:  Procedure Laterality Date  . ABDOMINAL HYSTERECTOMY    . APPENDECTOMY    . ESOPHAGOGASTRODUODENOSCOPY N/A 04/16/2015   Procedure: ESOPHAGOGASTRODUODENOSCOPY (EGD);  Surgeon: Hulen Luster, MD;  Location: [facility] ENDOSCOPY;  Service: Gastroenterology;  Laterality: N/A;   Constitutional Exam  General appearance: Well nourished, well developed, and well hydrated. In no apparent acute distress Vitals:   01/01/17 1042  BP: (!) 87/74  Pulse: 89  Resp: 16  Temp: 98.2 F (36.8 C)  TempSrc: Oral  SpO2: 98%  Weight: 175 lb (79.4 kg)  Height: 5' 3"  (1.6 m)   BMI Assessment: Estimated body mass index is 31 kg/m as calculated from the following:   Height as of this encounter: 5' 3"  (1.6 m).   Weight as of this encounter: 175 lb (79.4 kg).  BMI interpretation table: BMI level Category Range association with higher incidence of chronic pain  <18 kg/m2 Underweight   18.5-24.9 kg/m2 Ideal body weight   25-29.9 kg/m2 Overweight Increased incidence by 20%  30-34.9 kg/m2 Obese (Class I)  Increased incidence by 68%  35-39.9 kg/m2 Severe obesity (Class II) Increased incidence by 136%  >40 kg/m2 Extreme obesity (Class III) Increased incidence by 254%   BMI Readings from Last 4 Encounters:  01/01/17 31.00 kg/m  10/09/16 30.60 kg/m  06/24/16 31.00 kg/m  05/29/16 31.89 kg/m   Wt Readings from Last 4 Encounters:  01/01/17 175 lb (79.4 kg)  10/09/16 170 lb (77.1 kg)  06/24/16 175 lb (79.4 kg)  05/29/16 180 lb (81.6 kg)  Psych/Mental status: Alert, oriented x 3 (person, place, & time)       Eyes: PERLA Respiratory: No evidence of acute respiratory distress  Cervical Spine Exam  Inspection: No masses, redness, or swelling Alignment: Symmetrical Functional ROM: Unrestricted ROM Stability: No instability detected Muscle strength & Tone: Functionally intact Sensory: Unimpaired Palpation: Non-contributory  Upper Extremity (UE) Exam    Side: Right upper extremity  Side: Left  upper extremity  Inspection: No masses, redness, swelling, or asymmetry. No contractures  Inspection: No masses, redness, swelling, or asymmetry. No contractures  Functional ROM: Unrestricted ROM          Functional ROM: Unrestricted ROM          Muscle strength & Tone: Functionally intact  Muscle strength & Tone: Functionally intact  Sensory: Unimpaired  Sensory: Unimpaired  Palpation: Euthermic  Palpation: Euthermic  Specialized Test(s): Deferred         Specialized Test(s): Deferred          Thoracic Spine Exam  Inspection: No masses, redness, or swelling Alignment: Symmetrical Functional ROM: Unrestricted ROM Stability: No instability detected Sensory: Unimpaired Muscle strength & Tone: Functionally intact Palpation: Non-contributory  Lumbar Spine Exam  Inspection: No masses, redness, or swelling Alignment: Symmetrical Functional ROM: Diminished ROM Stability: No instability detected Muscle strength & Tone: Functionally intact Sensory: Unimpaired Palpation:  Non-contributory Provocative Tests: Lumbar Hyperextension and rotation test: evaluation deferred today       Patrick's Maneuver: evaluation deferred today              Gait & Posture Assessment  Ambulation: Unassisted Gait: Relatively normal for age and body habitus Posture: WNL   Lower Extremity Exam    Side: Right lower extremity  Side: Left lower extremity  Inspection: No masses, redness, swelling, or asymmetry. No contractures  Inspection: No masses, redness, swelling, or asymmetry. No contractures  Functional ROM: Unrestricted ROM          Functional ROM: Unrestricted ROM          Muscle strength & Tone: Functionally intact  Muscle strength & Tone: Functionally intact  Sensory: Unimpaired  Sensory: Unimpaired  Palpation: No palpable anomalies  Palpation: No palpable anomalies   Assessment  Primary Diagnosis & Pertinent Problem List: The primary encounter diagnosis was Chronic pain syndrome. Diagnoses of Chronic low back pain (Location of Primary Source of Pain) (Bilateral) (L>R), Chronic lower extremity pain (Location of Secondary source of pain) (Left), Chronic lumbar radicular pain (Location of Secondary source of pain) (Left) (L5 Dermatome), Lumbar facet syndrome (Location of Primary Source of Pain) (Bilateral) (L>R), Long term current use of opiate analgesic, and Opiate use were also pertinent to this visit.  Status Diagnosis  Controlled Controlled Controlled 1. Chronic pain syndrome   2. Chronic low back pain (Location of Primary Source of Pain) (Bilateral) (L>R)   3. Chronic lower extremity pain (Location of Secondary source of pain) (Left)   4. Chronic lumbar radicular pain (Location of Secondary source of pain) (Left) (L5 Dermatome)   5. Lumbar facet syndrome (Location of Primary Source of Pain) (Bilateral) (L>R)   6. Long term current use of opiate analgesic   7. Opiate use      Plan of Care  Pharmacotherapy (Medications Ordered): Meds ordered this encounter   Medications  . HYDROcodone-acetaminophen (NORCO/VICODIN) 5-325 MG tablet    Sig: Take 1 tablet by mouth every 12 (twelve) hours as needed for severe pain.    Dispense:  45 tablet    Refill:  0    Fill one day early if pharmacy is closed on scheduled refill date. Do not fill until: 02/14/17 To last until: 03/16/17  . HYDROcodone-acetaminophen (NORCO/VICODIN) 5-325 MG tablet    Sig: Take 1 tablet by mouth every 12 (twelve) hours as needed for moderate pain.    Dispense:  45 tablet    Refill:  0    Fill one day early if pharmacy  is closed on scheduled refill date. Do not fill until: 01/15/17 To last until: 02/14/17  . HYDROcodone-acetaminophen (NORCO/VICODIN) 5-325 MG tablet    Sig: Take 1 tablet by mouth every 12 (twelve) hours as needed for severe pain.    Dispense:  45 tablet    Refill:  0    Fill one day early if pharmacy is closed on scheduled refill date. Do not fill until: 03/16/17 To last until: 04/15/17   New Prescriptions   No medications on file   Medications administered today: Ms. Teas had no medications administered during this visit. Lab-work, procedure(s), and/or referral(s): Orders Placed This Encounter  Procedures  . ToxASSURE Select 13 (MW), Urine   Imaging and/or referral(s): None  Interventional therapies: Planned, scheduled, and/or pending:   Not at this time.   Considering:   Palliative left-sided L5-S1 lumbar epidural steroid injection   Palliative PRN treatment(s):   Palliative left-sided L5-S1 lumbar epidural steroid injection   Provider-requested follow-up: Return in about 3 months (around 03/31/2017) for (MD) Med-Mgmt, in addition, (PRN) procedure.  Future Appointments Date Time Provider [place]  03/31/2017 10:00 AM Milinda Pointer, MD [facility] None   Primary Care Physician: Cynthia Pink, MD Location: [facility] Outpatient [facility] Management Facility Note by: Inika Bellanger A. Cynthia Bennett, M.D, DABA, DABAPM, DABPM, DABIPP, FIPP Date: 01/01/2017;  Time: 4:15 PM  Pain Score Disclaimer: We use the NRS-11 scale. This is a self-reported, subjective measurement of pain severity with only modest accuracy. It is used primarily to identify changes within a particular patient. It must be understood that outpatient pain scales are significantly less accurate that those used for research, where they can be applied under ideal controlled circumstances with minimal exposure to variables. In reality, the score is likely to be a combination of pain intensity and pain affect, where pain affect describes the degree of emotional arousal or changes in action readiness caused by the sensory experience of pain. Factors such as social and work situation, setting, emotional state, anxiety levels, expectation, and prior pain experience may influence pain perception and show large inter-individual differences that may also be affected by time variables.  Patient instructions provided during this appointment: Patient Instructions   Pain Score  Introduction: The pain score used by this practice is the Verbal Numerical Rating Scale (VNRS-11). This is an 11-point scale. It is for adults and children 10 years or older. There are significant differences in how the pain score is reported, used, and applied. Forget everything you learned in the past and learn this scoring system.  General Information: The scale should reflect your current level of pain. Unless you are specifically asked for the level of your worst pain, or your average pain. If you are asked for one of these two, then it should be understood that it is over the past 24 hours.  Basic Activities of Daily Living (ADL): Personal hygiene, dressing, eating, transferring, and using restroom.  Instructions: Most patients tend to report their level of pain as a combination of two factors, their physical pain and their psychosocial pain. This last one is also known as "suffering" and it is reflection of how physical  pain affects you socially and psychologically. From now on, report them separately. From this point on, when asked to report your pain level, report only your physical pain. Use the following table for reference.  Pain Bennett Pain Levels (0-5/10)  Pain Level Score Description  No Pain 0   Mild pain 1 Nagging, annoying, but does not interfere with  basic activities of daily living (ADL). Patients are able to eat, bathe, get dressed, toileting (being able to get on and off the toilet and perform personal hygiene functions), transfer (move in and out of bed or a chair without assistance), and maintain continence (able to control bladder and bowel functions). Blood pressure and heart rate are unaffected. A normal heart rate for a healthy adult ranges from 60 to 100 bpm (beats per minute).   Mild to moderate pain 2 Noticeable and distracting. Impossible to hide from other people. More frequent flare-ups. Still possible to adapt and function close to normal. It can be very annoying and may have occasional stronger flare-ups. With discipline, patients may get used to it and adapt.   Moderate pain 3 Interferes significantly with activities of daily living (ADL). It becomes difficult to feed, bathe, get dressed, get on and off the toilet or to perform personal hygiene functions. Difficult to get in and out of bed or a chair without assistance. Very distracting. With effort, it can be ignored when deeply involved in activities.   Moderately severe pain 4 Impossible to ignore for more than a few minutes. With effort, patients may still be able to manage work or participate in some social activities. Very difficult to concentrate. Signs of autonomic nervous system discharge are evident: dilated pupils (mydriasis); mild sweating (diaphoresis); sleep interference. Heart rate becomes elevated (>115 bpm). Diastolic blood pressure (lower number) rises above 100 mmHg. Patients find relief in laying down and not moving.    Severe pain 5 Intense and extremely unpleasant. Associated with frowning face and frequent crying. Pain overwhelms the senses.  Ability to do any activity or maintain social relationships becomes significantly limited. Conversation becomes difficult. Pacing back and forth is common, as getting into a comfortable position is nearly impossible. Pain wakes you up from deep sleep. Physical signs will be obvious: pupillary dilation; increased sweating; goosebumps; brisk reflexes; cold, clammy hands and feet; nausea, vomiting or dry heaves; loss of appetite; significant sleep disturbance with inability to fall asleep or to remain asleep. When persistent, significant weight loss is observed due to the complete loss of appetite and sleep deprivation.  Blood pressure and heart rate becomes significantly elevated. Caution: If elevated blood pressure triggers a pounding headache associated with blurred vision, then the patient should immediately seek attention at an urgent or emergency care unit, as these may be signs of an impending stroke.    Emergency Department Pain Levels (6-10/10)  Emergency Room Pain 6 Severely limiting. Requires emergency care and should not be seen or managed at an outpatient pain management facility. Communication becomes difficult and requires great effort. Assistance to reach the emergency department may be required. Facial flushing and profuse sweating along with potentially dangerous increases in heart rate and blood pressure will be evident.   Distressing pain 7 Self-care is very difficult. Assistance is required to transport, or use restroom. Assistance to reach the emergency department will be required. Tasks requiring coordination, such as bathing and getting dressed become very difficult.   Disabling pain 8 Self-care is no longer possible. At this level, pain is disabling. The individual is unable to do even the most "basic" activities such as walking, eating, bathing, dressing,  transferring to a bed, or toileting. Fine motor skills are lost. It is difficult to think clearly.   Incapacitating pain 9 Pain becomes incapacitating. Thought processing is no longer possible. Difficult to remember your own name. Control of movement and coordination are lost.  The worst pain imaginable 10 At this level, most patients pass out from pain. When this level is reached, collapse of the autonomic nervous system occurs, leading to a sudden drop in blood pressure and heart rate. This in turn results in a temporary and dramatic drop in blood flow to the brain, leading to a loss of consciousness. Fainting is one of the body's self defense mechanisms. Passing out puts the brain in a calmed state and causes it to shut down for a while, in order to begin the healing process.    Summary: 1. Refer to this scale when providing [place] with your pain level. 2. Be accurate and careful when reporting your pain level. This will help with your care. 3. Over-reporting your pain level will lead to loss of credibility. 4. Even a level of 1/10 means that there is pain and will be treated at our facility. 5. High, inaccurate reporting will be documented as "Symptom Exaggeration", leading to loss of credibility and suspicions of possible secondary gains such as obtaining more narcotics, or wanting to appear disabled, for fraudulent reasons. 6. Only pain levels of 5 or below will be seen at our facility. 7. Pain levels of 6 and above will be sent to the Emergency Department and the appointment cancelled. _____________________________________________________________________________________________

## 2017-01-01 NOTE — Progress Notes (Signed)
Patient complains of pain in low back.  Denies allergies to essential oils.  Educated on [employer] oil.  Patient states she would love to try inhaling the oil.  Gave patient medicine cup with 1 drop of 50% diluted Lavender oil for inhalation.  Patient states she enjoyed the lavender.  Denies any reaction.

## 2017-01-01 NOTE — Progress Notes (Signed)
Nursing Pain Medication Assessment:  Safety precautions to be maintained throughout the outpatient stay will include: orient to surroundings, keep bed in low position, maintain call bell within reach at all times, provide assistance with transfer out of bed and ambulation.  Medication Inspection Compliance: Pill count conducted under aseptic conditions, in front of the patient. Neither the pills nor the bottle was removed from the patient's sight at any time. Once count was completed pills were immediately returned to the patient in their original bottle.  Medication: Hydrocodone/APAP Pill/Patch Count: 22 of 60 pills remain Bottle Appearance: Standard pharmacy container. Clearly labeled. Filled Date: 01 /24/ 2018 Last Medication intake:  Burgess EstelleYesterday

## 2017-01-05 DIAGNOSIS — R7989 Other specified abnormal findings of blood chemistry: Secondary | ICD-10-CM | POA: Diagnosis not present

## 2017-01-08 LAB — TOXASSURE SELECT 13 (MW), URINE

## 2017-01-26 DIAGNOSIS — H401123 Primary open-angle glaucoma, left eye, severe stage: Secondary | ICD-10-CM | POA: Diagnosis not present

## 2017-02-02 DIAGNOSIS — H401133 Primary open-angle glaucoma, bilateral, severe stage: Secondary | ICD-10-CM | POA: Diagnosis not present

## 2017-03-30 IMAGING — US US CAROTID DUPLEX BILAT
1 series · 13 of 24 positions shown · non-contrast
Comparison: No recent prior.

CLINICAL DATA: PVD.

EXAM:
BILATERAL CAROTID DUPLEX ULTRASOUND
TECHNIQUE: Gray scale imaging, color Doppler and duplex ultrasound were
performed of bilateral carotid and vertebral arteries in the neck.

[Series 1: us carotid duplex bilat · 0.07mm/px · 13 of 71 slices shown]
[im 1/71]
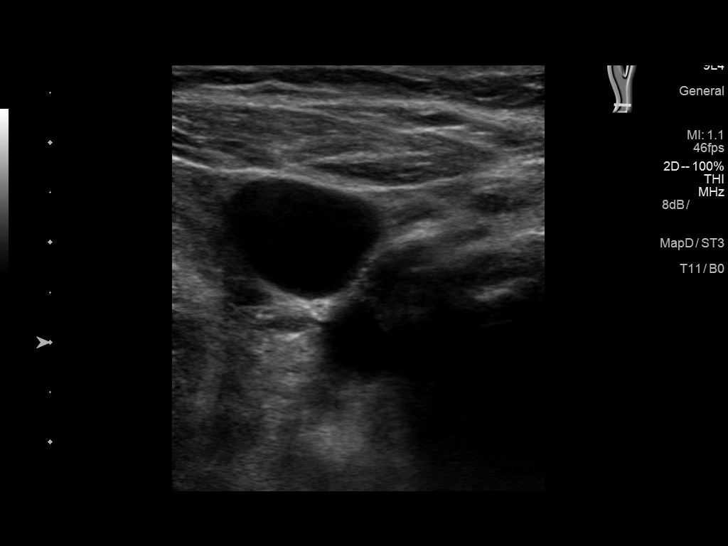
[im 7/71]
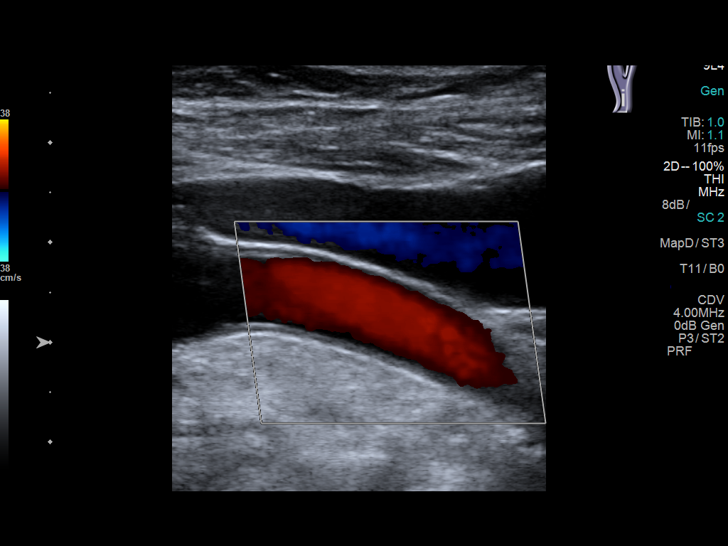
[im 13/71]
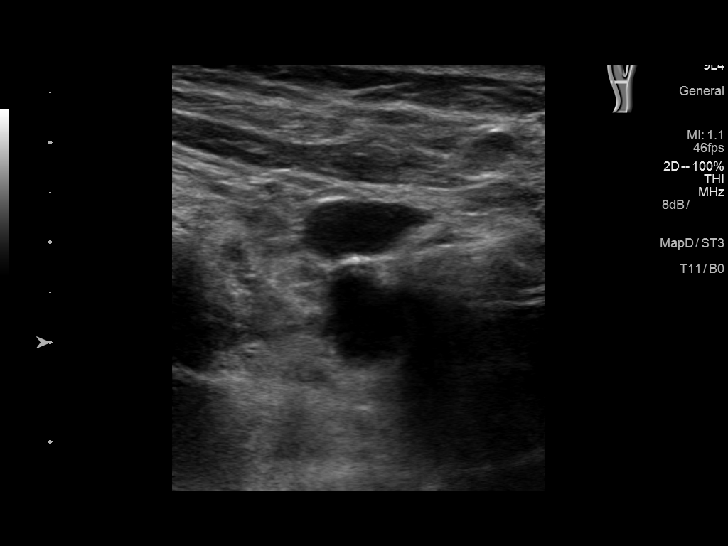
[im 19/71]
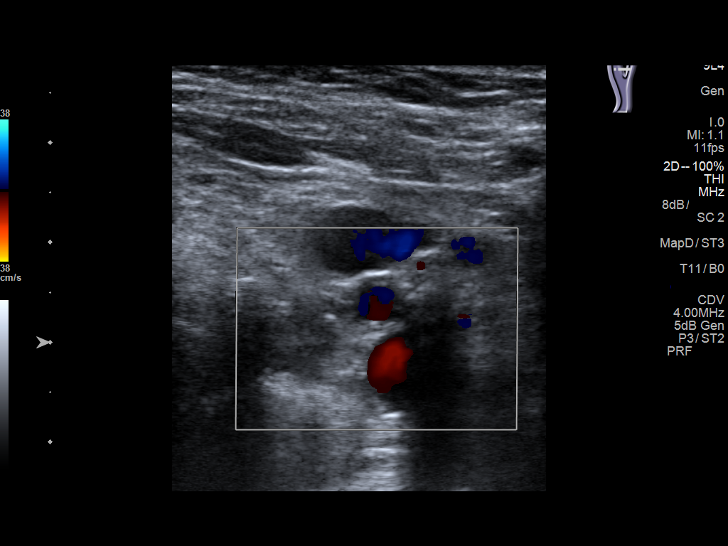
[im 25/71]
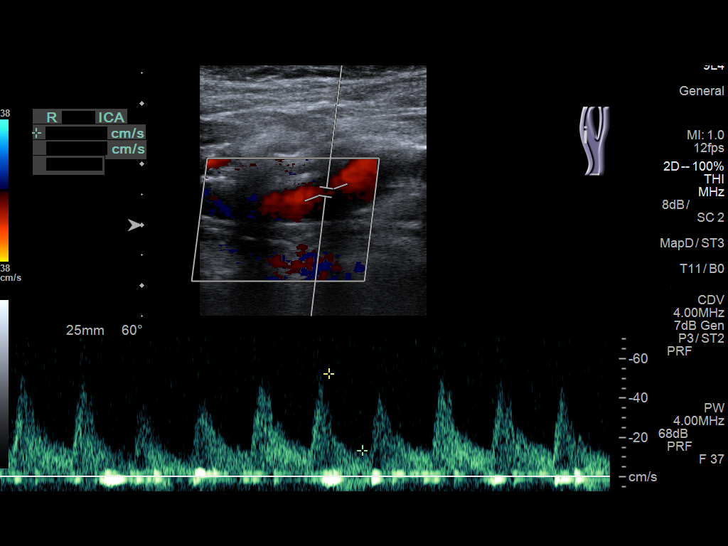
[im 31/71]
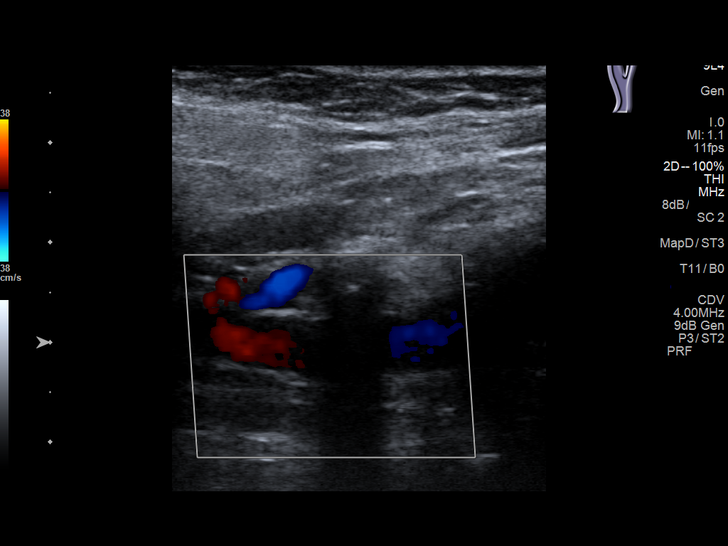
[im 37/71]
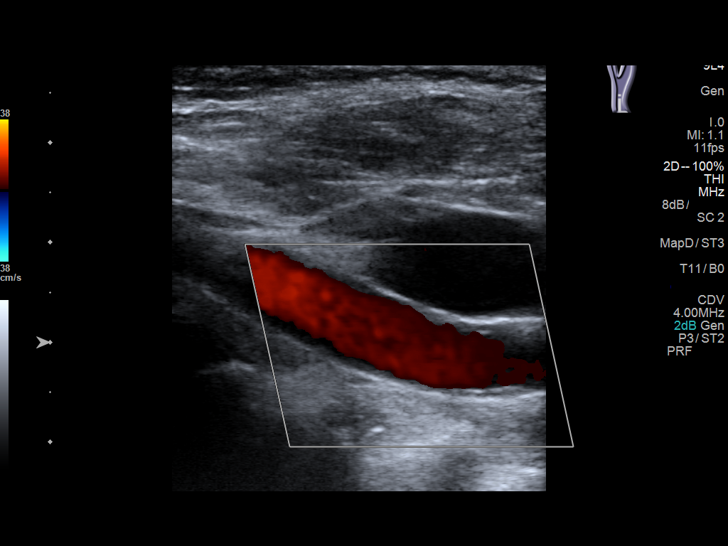
[im 40/71]
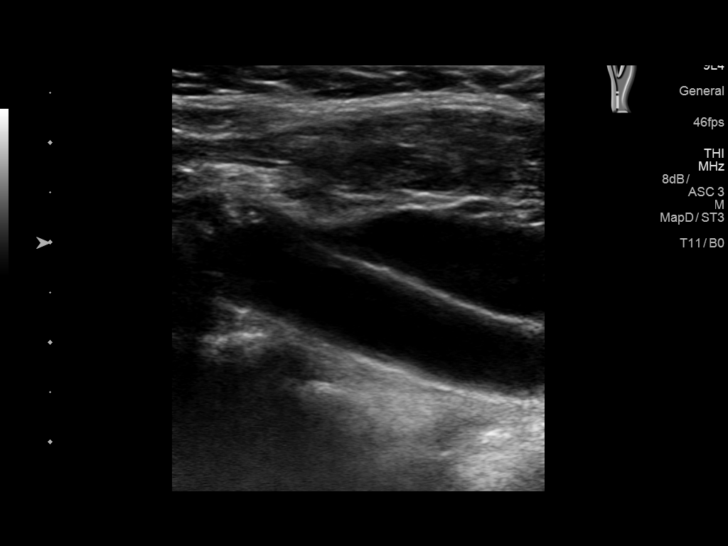
[im 46/71]
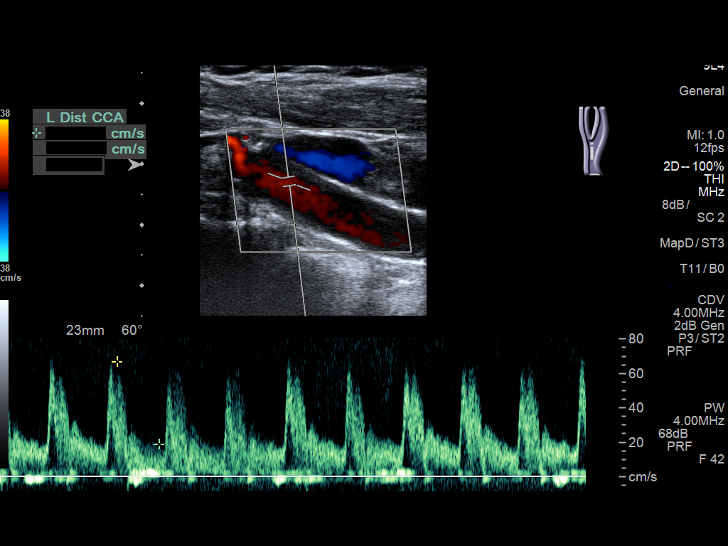
[im 52/71]
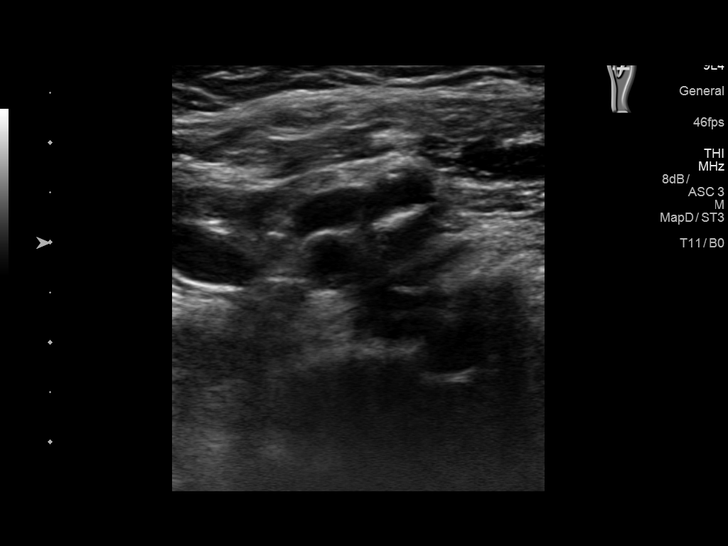
[im 58/71]
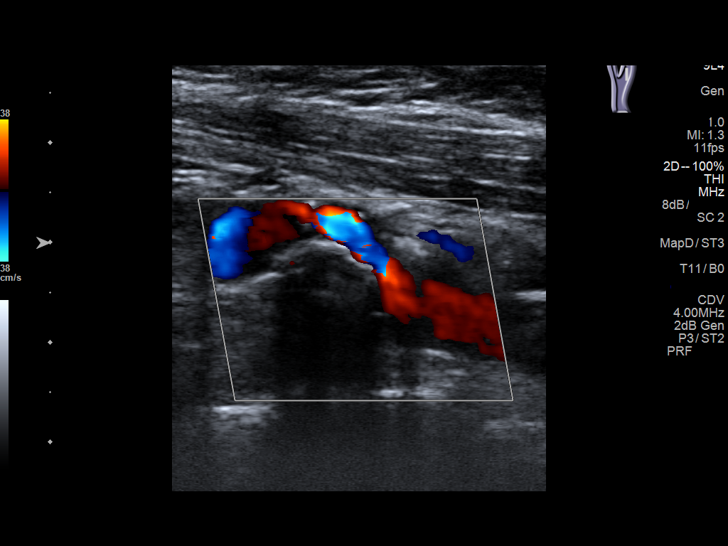
[im 64/71]
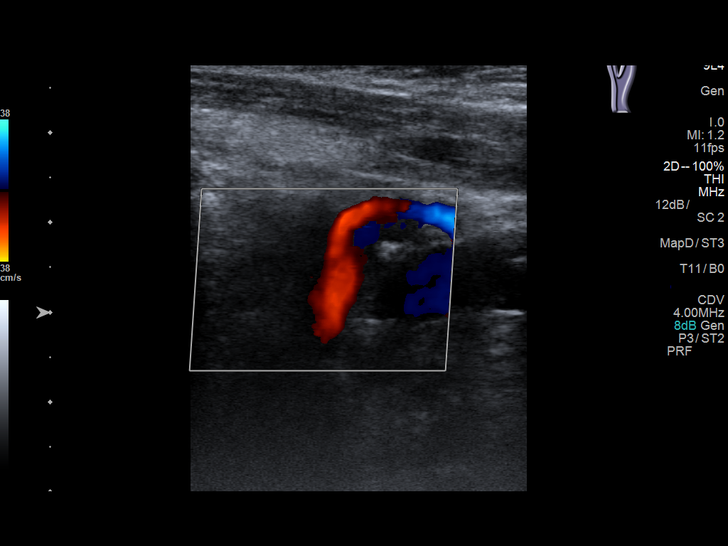
[im 71/71]
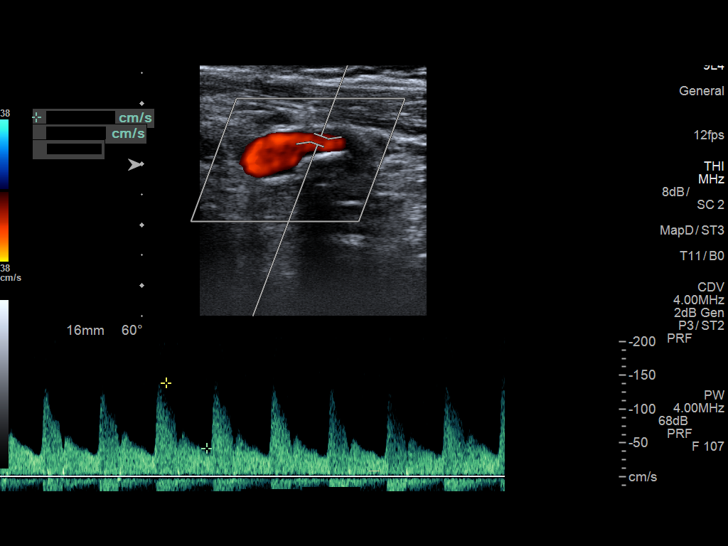

[13 of 24 positions shown; findings below may reference images not displayed]

FINDINGS: Criteria: Quantification of carotid stenosis is based on velocity
parameters that correlate the residual internal carotid diameter
with NASCET-based stenosis levels, using the diameter of the distal
internal carotid lumen as the denominator for stenosis measurement.

The following velocity measurements were obtained:

RIGHT

ICA:  52/13 cm/sec

CCA:  82/19 cm/sec

SYSTOLIC ICA/CCA RATIO:

DIASTOLIC ICA/CCA RATIO:

ECA:  70 cm/sec

LEFT

ICA:  195/72 cm/sec

CCA:  71/17 cm/sec

SYSTOLIC ICA/CCA RATIO:

DIASTOLIC ICA/CCA RATIO:

ECA:  119 cm/sec

RIGHT CAROTID ARTERY: Mild diffuse intimal thickening right common
carotid artery and right proximal bifurcation. No flow limiting
stenosis. Degree of stenosis less than 50%

RIGHT VERTEBRAL ARTERY:  Patent with antegrade flow.

LEFT CAROTID ARTERY: Mild diffuse common carotid artery
atherosclerotic vascular disease. Moderate atherosclerotic plaque
left carotid bifurcation and proximal ICA. Visually degree of
stenosis in the 50-69% range.

LEFT VERTEBRAL ARTERY:  Patent with antegrade flow.
IMPRESSION: 1. Mild diffuse left common carotid atherosclerotic vascular
disease. Moderate atherosclerotic vascular plaque noted at the left
carotid bifurcation and proximal ICA. Visually the degree of
stenosis in the 50-69% range. Degree of stenosis is most likely in
the upper end of this range. Close follow-up suggested.

2. Mild right common carotid artery intimal thickening. No flow
limiting stenosis on the right. Degree of stenosis less 50%.

3. Vertebral arteries are patent antegrade flow.

## 2017-03-31 ENCOUNTER — Ambulatory Visit: Payer: PPO | Attending: Pain Medicine | Admitting: Pain Medicine

## 2017-03-31 ENCOUNTER — Ambulatory Visit
Admission: RE | Admit: 2017-03-31 | Discharge: 2017-03-31 | Disposition: A | Discharge status: Home / Self Care | Payer: PPO | Source: Ambulatory Visit | Attending: Pain Medicine | Admitting: Pain Medicine

## 2017-03-31 ENCOUNTER — Encounter: Payer: Self-pay | Admitting: Pain Medicine

## 2017-03-31 VITALS — BP 111/94 | HR 71 | Temp 98.4°F | Resp 16 | Ht 62.0 in | Wt 175.0 lb

## 2017-03-31 DIAGNOSIS — Z720 Tobacco use: Secondary | ICD-10-CM | POA: Insufficient documentation

## 2017-03-31 DIAGNOSIS — E785 Hyperlipidemia, unspecified: Secondary | ICD-10-CM | POA: Diagnosis not present

## 2017-03-31 DIAGNOSIS — M47896 Other spondylosis, lumbar region: Secondary | ICD-10-CM | POA: Diagnosis not present

## 2017-03-31 DIAGNOSIS — M797 Fibromyalgia: Secondary | ICD-10-CM | POA: Diagnosis not present

## 2017-03-31 DIAGNOSIS — Z79891 Long term (current) use of opiate analgesic: Secondary | ICD-10-CM | POA: Diagnosis not present

## 2017-03-31 DIAGNOSIS — G8929 Other chronic pain: Secondary | ICD-10-CM

## 2017-03-31 DIAGNOSIS — M19012 Primary osteoarthritis, left shoulder: Secondary | ICD-10-CM | POA: Diagnosis not present

## 2017-03-31 DIAGNOSIS — M5416 Radiculopathy, lumbar region: Secondary | ICD-10-CM | POA: Diagnosis not present

## 2017-03-31 DIAGNOSIS — M25562 Pain in left knee: Secondary | ICD-10-CM | POA: Diagnosis not present

## 2017-03-31 DIAGNOSIS — K219 Gastro-esophageal reflux disease without esophagitis: Secondary | ICD-10-CM | POA: Insufficient documentation

## 2017-03-31 DIAGNOSIS — M79605 Pain in left leg: Secondary | ICD-10-CM | POA: Insufficient documentation

## 2017-03-31 DIAGNOSIS — N183 Chronic kidney disease, stage 3 (moderate): Secondary | ICD-10-CM | POA: Insufficient documentation

## 2017-03-31 DIAGNOSIS — Z780 Asymptomatic menopausal state: Secondary | ICD-10-CM | POA: Insufficient documentation

## 2017-03-31 DIAGNOSIS — M79602 Pain in left arm: Secondary | ICD-10-CM | POA: Diagnosis not present

## 2017-03-31 DIAGNOSIS — F1190 Opioid use, unspecified, uncomplicated: Secondary | ICD-10-CM | POA: Diagnosis not present

## 2017-03-31 DIAGNOSIS — M549 Dorsalgia, unspecified: Secondary | ICD-10-CM | POA: Diagnosis not present

## 2017-03-31 DIAGNOSIS — M25512 Pain in left shoulder: Secondary | ICD-10-CM | POA: Insufficient documentation

## 2017-03-31 DIAGNOSIS — G894 Chronic pain syndrome: Secondary | ICD-10-CM | POA: Insufficient documentation

## 2017-03-31 DIAGNOSIS — M545 Low back pain: Secondary | ICD-10-CM

## 2017-03-31 DIAGNOSIS — M48061 Spinal stenosis, lumbar region without neurogenic claudication: Secondary | ICD-10-CM | POA: Insufficient documentation

## 2017-03-31 DIAGNOSIS — M1610 Unilateral primary osteoarthritis, unspecified hip: Secondary | ICD-10-CM | POA: Diagnosis not present

## 2017-03-31 DIAGNOSIS — N319 Neuromuscular dysfunction of bladder, unspecified: Secondary | ICD-10-CM | POA: Insufficient documentation

## 2017-03-31 DIAGNOSIS — M533 Sacrococcygeal disorders, not elsewhere classified: Secondary | ICD-10-CM | POA: Insufficient documentation

## 2017-03-31 DIAGNOSIS — Z886 Allergy status to analgesic agent status: Secondary | ICD-10-CM | POA: Diagnosis not present

## 2017-03-31 MED ORDER — OXYCODONE HCL 5 MG PO TABS: 5.0000 mg | ORAL_TABLET | Freq: Two times a day (BID) | ORAL | 0 refills | Status: DC | PRN

## 2017-03-31 NOTE — Progress Notes (Signed)
Patient's Name: Cynthia Bennett  MRN: [id number]  Referring Provider: [name], MD  DOB: 10-10-1933  PCP: [name], MD  DOS: 03/31/2017  Note by: Kathlen Brunswick. Dossie Arbour, MD  Service setting: Ambulatory outpatient  Specialty: Interventional Pain Management  Location: [facility] (AMB) Pain Management Facility    Patient type: Established   Primary Reason(s) for Visit: Encounter for prescription drug management (Level of risk: moderate) CC: Back Pain (left side ); Shoulder Pain (left ); and Arm Pain (left)  HPI  [name] is a 83 y.o. year old, female patient, who comes today for a medication management evaluation. She has Fibromyalgia; Adiposity; Current tobacco use; Bilateral low back pain with left-sided sciatica; Chronic pain syndrome; Lumbar radicular pain (Bilateral) (R>L); Encounter for therapeutic drug level monitoring; Long term current use of opiate analgesic; Long term prescription opiate use; Uncomplicated opioid dependence ([place]); Opiate use; Acid reflux; HLD (hyperlipidemia); Cannot sleep; Primary open angle glaucoma; Post menopausal syndrome; Temporary cerebral vascular dysfunction; Chronic low back pain (Location of Primary Source of Pain) (Bilateral) (L>R); Grade 1 spondylolisthesis of L4 over L5; Severe (5-6 mm) L4-5 lumbar spinal stenosis and (8 mm) L3-4 spinal stenosis.; Lumbar discogenic pain syndrome; Lumbar facet syndrome (Location of Primary Source of Pain) (Bilateral) (L>R); Lumbar facet hypertrophy (Bilateral); Chronic sacroiliac joint pain (Left); Chronic hip pain (Left); Peptic ulcer disease; Morbid obesity ([place]); GERD (gastroesophageal reflux disease); Encounter for chronic pain management; Opiate analgesic contract exists ([facility]); Lumbar spondylosis; Abnormal MRI, lumbar spine (03/20/2000); Cervical spondylosis (Anterolisthesis of C5 over C6); Difficulty voiding; Neurogenic bladder; Chronic lower extremity pain (Location of Secondary source of  pain) (Left); Chronic lumbar radicular pain (Location of Secondary source of pain) (Left) (L5 Dermatome); Osteoarthritis of hip (Left); Chronic hip pain (Bilateral) (R>L); Chronic sacroiliac joint pain (Bilateral) (R>L); Kidney disease, chronic, stage III (GFR 30-59 ml/min); and Chronic shoulder pain (Left) on her problem list. Her primarily concern today is the Back Pain (left side ); Shoulder Pain (left ); and Arm Pain (left)  Pain Assessment: Self-Reported Pain Score: 7 /10 Clinically the patient looks like a 4/10 Reported level is inconsistent with clinical observations. Information on the proper use of the pain scale provided to the patient today Pain Type: Chronic pain Pain Location: Back Pain Orientation:  (low, left knee) Pain Descriptors / Indicators: Burning Pain Frequency: Intermittent  [name] was last scheduled for an appointment on 01/01/2017 for medication management. During today's appointment we reviewed [name] chronic pain status, as well as her outpatient medication regimen. Today the patient comes in complaining of a new pain in the area of the left shoulder with significant decreased range of motion. She is unable to abduct the shoulder past 90.  The patient  reports that she does not use drugs. Her body mass index is 32.01 kg/m.  Further details on both, my assessment(s), as well as the proposed treatment plan, please see below.  Controlled Substance Pharmacotherapy Assessment REMS (Risk Evaluation and Mitigation Strategy)  Analgesic:Hydrocodone/APAP 5/325 One and a half tablets per day (7.5 mg/day) MME/day:7.46m/day  NMorley Kos RN  03/31/2017 10:20 AM  Signed Safety precautions to be maintained throughout the outpatient stay will include: orient to surroundings, keep bed in low position, maintain call bell within reach at all times, provide assistance with transfer out of bed and ambulation.   NMorley Kos RN  03/31/2017 10:20 AM  Signed Nursing Pain  Medication Assessment:  Safety precautions to be maintained throughout the outpatient stay  will include: orient to surroundings, keep bed in low position, maintain call bell within reach at all times, provide assistance with transfer out of bed and ambulation.  Medication Inspection Compliance: Pill count conducted under aseptic conditions, in front of the patient. Neither the pills nor the bottle was removed from the patient's sight at any time. Once count was completed pills were immediately returned to the patient in their original bottle.  Medication: Hydrocodone/APAP Pill/Patch Count: 3 of 45 pills remain Pill/Patch Appearance: Markings consistent with prescribed medication Bottle Appearance: Standard pharmacy container. Clearly labeled. Filled Date 04 /19 / 2018 Last Medication intake:  Today   Pharmacokinetics: Liberation and absorption (onset of action): WNL Distribution (time to peak effect): WNL Metabolism and excretion (duration of action): WNL         Pharmacodynamics: Desired effects: Analgesia: [name] reports >50% benefit. Functional ability: Patient reports that medication allows her to accomplish basic ADLs Clinically meaningful improvement in function (CMIF): Sustained CMIF goals met Perceived effectiveness: Described as relatively effective, allowing for increase in activities of daily living (ADL) Undesirable effects: Side-effects or Adverse reactions: None reported Monitoring: [place] PMP: Online review of the past 50-monthperiod conducted. Compliant with practice rules and regulations List of all UDS test(s) done:  Lab Results  Component Value Date   TOXASSSELUR FINAL 01/01/2017   TChicoraFINAL 01/23/2016   Last UDS on record: ToxAssure Select 13  Date Value Ref Range Status  01/01/2017 FINAL  Final    Comment:    ==================================================================== TOXASSURE SELECT 13  (MW) ==================================================================== Test                             Result       Flag       Units Drug Present and Declared for Prescription Verification   Hydrocodone                    1447         EXPECTED   ng/mg creat   Hydromorphone                  37           EXPECTED   ng/mg creat   Dihydrocodeine                 209          EXPECTED   ng/mg creat   Norhydrocodone                 2341         EXPECTED   ng/mg creat    Sources of hydrocodone include scheduled prescription    medications. Hydromorphone, dihydrocodeine and norhydrocodone are    expected metabolites of hydrocodone. Hydromorphone and    dihydrocodeine are also available as scheduled prescription    medications. ==================================================================== Test                      Result    Flag   Units      Ref Range   Creatinine              152              mg/dL      >=20 ==================================================================== Declared Medications:  The flagging and interpretation on this report are based on the  following declared medications.  Unexpected results may arise from  inaccuracies in  the declared medications.  **Note: The testing scope of this panel includes these medications:  Hydrocodone (Hydrocodone-Acetaminophen)  **Note: The testing scope of this panel does not include following  reported medications:  Acetaminophen (Hydrocodone-Acetaminophen)  Bupropion (Wellbutrin)  Dorzolamide  Doxepin  Multivitamin (MVI)  Pantoprazole  Timolol  Zolpidem (Ambien) ==================================================================== For clinical consultation, please call [phone]. ====================================================================    UDS interpretation: Compliant          Medication Assessment Form: Reviewed. Patient indicates being compliant with therapy Treatment compliance: Compliant Risk  Assessment Profile: Aberrant behavior: See prior evaluations. None observed or detected today Comorbid factors increasing risk of overdose: See prior notes. No additional risks detected today Risk of substance use disorder (SUD): Low Opioid Risk Tool (ORT) Total Score: 0  Interpretation Table:  Score <3 = Low Risk for SUD  Score between 4-7 = Moderate Risk for SUD  Score >8 = High Risk for Opioid Abuse   Risk Mitigation Strategies:  Patient Counseling: Covered Patient-Prescriber Agreement (PPA): Present and active  Notification to other healthcare providers: Done  Pharmacologic Plan: No change in therapy, at this time  Laboratory Chemistry  Inflammation Markers Lab Results  Component Value Date   CRP 0.6 06/24/2016   ESRSEDRATE 23 06/24/2016   (CRP: Acute Phase) (ESR: Chronic Phase) Renal Function Markers Lab Results  Component Value Date   BUN 13 06/24/2016   CREATININE 0.95 06/24/2016   GFRAA >60 06/24/2016   GFRNONAA 55 (L) 06/24/2016   Hepatic Function Markers Lab Results  Component Value Date   AST 17 06/24/2016   ALT 11 (L) 06/24/2016   ALBUMIN 3.9 06/24/2016   ALKPHOS 70 06/24/2016   Electrolytes Lab Results  Component Value Date   NA 137 06/24/2016   K 4.3 06/24/2016   CL 107 06/24/2016   CALCIUM 9.1 06/24/2016   MG 2.2 06/24/2016   Neuropathy Markers Lab Results  Component Value Date   VITAMINB12 240 06/24/2016   Bone Pathology Markers Lab Results  Component Value Date   ALKPHOS 70 06/24/2016   25OHVITD1 26 (L) 06/24/2016   25OHVITD2 5.6 06/24/2016   25OHVITD3 20 06/24/2016   CALCIUM 9.1 06/24/2016   Coagulation Parameters Lab Results  Component Value Date   INR 0.96 06/24/2016   LABPROT 12.8 06/24/2016   PLT 291 06/24/2016   Cardiovascular Markers Lab Results  Component Value Date   HGB 12.7 06/24/2016   HCT 38.6 06/24/2016   Note: Lab results reviewed.  Recent Diagnostic Imaging Review  US Carotid Bilateral  Result Date:  12/08/2016 CLINICAL DATA:  PVD. EXAM: BILATERAL CAROTID DUPLEX ULTRASOUND TECHNIQUE: Pearline Cables scale imaging, color Doppler and duplex ultrasound were performed of bilateral carotid and vertebral arteries in the neck. COMPARISON:  No recent prior. FINDINGS: Criteria: Quantification of carotid stenosis is based on velocity parameters that correlate the residual internal carotid diameter with NASCET-based stenosis levels, using the diameter of the distal internal carotid lumen as the denominator for stenosis measurement. The following velocity measurements were obtained: RIGHT ICA:  52/13 cm/sec CCA:  03/54 cm/sec SYSTOLIC ICA/CCA RATIO:  0.6 DIASTOLIC ICA/CCA RATIO:  0.7 ECA:  70 cm/sec LEFT ICA:  195/72 cm/sec CCA:  65/68 cm/sec SYSTOLIC ICA/CCA RATIO:  2.8 DIASTOLIC ICA/CCA RATIO:  4.4 ECA:  119 cm/sec RIGHT CAROTID ARTERY: Mild diffuse intimal thickening right common carotid artery and right proximal bifurcation. No flow limiting stenosis. Degree of stenosis less than 50% RIGHT VERTEBRAL ARTERY:  Patent with antegrade flow. LEFT CAROTID ARTERY: Mild diffuse common carotid artery atherosclerotic  vascular disease. Moderate atherosclerotic plaque left carotid bifurcation and proximal ICA. Visually degree of stenosis in the 50-69% range. LEFT VERTEBRAL ARTERY:  Patent with antegrade flow. IMPRESSION: 1. Mild diffuse left common carotid atherosclerotic vascular disease. Moderate atherosclerotic vascular plaque noted at the left carotid bifurcation and proximal ICA. Visually the degree of stenosis in the 50-69% range. Degree of stenosis is most likely in the upper end of this range. Close follow-up suggested. 2. Mild right common carotid artery intimal thickening. No flow limiting stenosis on the right. Degree of stenosis less 50%. 3. Vertebral arteries are patent antegrade flow. Electronically Signed   By: Marcello Moores  Register   On: 12/08/2016 15:00   Note: Imaging results reviewed.          Meds  The patient has a current  medication list which includes the following prescription(s): bupropion, dorzolamide-timolol, doxepin, oxycodone, oxycodone, oxycodone, pantoprazole, sertraline, and zolpidem.  Current Outpatient Prescriptions on File Prior to Visit  Medication Sig  . buPROPion (WELLBUTRIN SR) 150 MG 12 hr tablet TAKE 1 TABLET BY MOUTH ONCE DAILY  . dorzolamide-timolol (COSOPT) 22.3-6.8 MG/ML ophthalmic solution INT 1 GTT IN OU BID  . doxepin (SINEQUAN) 100 MG capsule Take 100 mg by mouth at bedtime.  Marland Kitchen zolpidem (AMBIEN) 10 MG tablet TAKE 1/2 TO 1 TABLET BY MOUTH EVERY NIGHT AT BEDTIME   No current facility-administered medications on file prior to visit.    ROS  Constitutional: Denies any fever or chills Gastrointestinal: No reported hemesis, hematochezia, vomiting, or acute GI distress Musculoskeletal: Denies any acute onset joint swelling, redness, loss of ROM, or weakness Neurological: No reported episodes of acute onset apraxia, aphasia, dysarthria, agnosia, amnesia, paralysis, loss of coordination, or loss of consciousness  Allergies  Ms. Lefferts is allergic to ibuprofen and nsaids.  PFSH  Drug: Ms. Whittingham  reports that she does not use drugs. Alcohol:  reports that she does not drink alcohol. Tobacco:  reports that she has quit smoking. She has never used smokeless tobacco. Medical:  has a past medical history of Fibromyalgia; Glaucoma; Hiatal hernia; Osteoarthritis; Osteoarthritis of spine with radiculopathy, lumbar region (08/14/2015); Peptic ulcer disease; Reflux; Spinal stenosis; Stroke [facility]); and TIA (transient ischemic attack). Family: family history includes COPD in her father; Mental illness in her mother.  Past Surgical History:  Procedure Laterality Date  . ABDOMINAL HYSTERECTOMY    . APPENDECTOMY    . ESOPHAGOGASTRODUODENOSCOPY N/A 04/16/2015   Procedure: ESOPHAGOGASTRODUODENOSCOPY (EGD);  Surgeon: Hulen Luster, MD;  Location[facility] ENDOSCOPY;  Service: Gastroenterology;  Laterality: N/A;    Constitutional Exam  General appearance: Well nourished, well developed, and well hydrated. In no apparent acute distress Vitals:   03/31/17 0959  BP: (!) 111/94  Pulse: 71  Resp: 16  Temp: 98.4 F (36.9 C)  TempSrc: Oral  SpO2: 98%  Weight: 175 lb (79.4 kg)  Height: 5' 2"  (1.575 m)   BMI Assessment: Estimated body mass index is 32.01 kg/m as calculated from the following:   Height as of this encounter: 5' 2"  (1.575 m).   Weight as of this encounter: 175 lb (79.4 kg).  BMI interpretation table: BMI level Category Range association with higher incidence of chronic pain  <18 kg/m2 Underweight   18.5-24.9 kg/m2 Ideal body weight   25-29.9 kg/m2 Overweight Increased incidence by 20%  30-34.9 kg/m2 Obese (Class I) Increased incidence by 68%  35-39.9 kg/m2 Severe obesity (Class II) Increased incidence by 136%  >40 kg/m2 Extreme obesity (Class III) Increased incidence by 254%  BMI Readings from Last 4 Encounters:  03/31/17 32.01 kg/m  01/01/17 31.00 kg/m  10/09/16 30.60 kg/m  06/24/16 31.00 kg/m   Wt Readings from Last 4 Encounters:  03/31/17 175 lb (79.4 kg)  01/01/17 175 lb (79.4 kg)  10/09/16 170 lb (77.1 kg)  06/24/16 175 lb (79.4 kg)  Psych/Mental status: Alert, oriented x 3 (person, place, & time)       Eyes: PERLA Respiratory: No evidence of acute respiratory distress  Cervical Spine Exam  Inspection: No masses, redness, or swelling Alignment: Symmetrical Functional ROM: Unrestricted ROM      Stability: No instability detected Muscle strength & Tone: Functionally intact Sensory: Unimpaired Palpation: No palpable anomalies              Upper Extremity (UE) Exam    Side: Right upper extremity  Side: Left upper extremity  Inspection: No masses, redness, swelling, or asymmetry. No contractures  Inspection: No masses, redness, swelling, or asymmetry. No contractures  Functional ROM: Unrestricted ROM          Functional ROM: Limited ROM for shoulder  Muscle  strength & Tone: Functionally intact  Muscle strength & Tone: Guarding  Sensory: Unimpaired  Sensory: Articular pain pattern  Palpation: No palpable anomalies              Palpation: Complains of area being tender to palpation              Specialized Test(s): Deferred         Specialized Test(s): Deferred          Thoracic Spine Exam  Inspection: No masses, redness, or swelling Alignment: Symmetrical Functional ROM: Unrestricted ROM Stability: No instability detected Sensory: Unimpaired Muscle strength & Tone: No palpable anomalies  Lumbar Spine Exam  Inspection: No masses, redness, or swelling Alignment: Symmetrical Functional ROM: Decreased ROM      Stability: No instability detected Muscle strength & Tone: Functionally intact Sensory: Movement-associated pain Palpation: Complains of area being tender to palpation       Provocative Tests: Lumbar Hyperextension and rotation test: Positive bilaterally for facet joint pain. Patrick's Maneuver: evaluation deferred today                    Gait & Posture Assessment  Ambulation: Unassisted Gait: Antalgic Posture: Antalgic   Lower Extremity Exam    Side: Right lower extremity  Side: Left lower extremity  Inspection: No masses, redness, swelling, or asymmetry. No contractures  Inspection: No masses, redness, swelling, or asymmetry. No contractures  Functional ROM: Unrestricted ROM          Functional ROM: Unrestricted ROM          Muscle strength & Tone: Functionally intact  Muscle strength & Tone: Functionally intact  Sensory: Unimpaired  Sensory: Unimpaired  Palpation: No palpable anomalies  Palpation: No palpable anomalies   Assessment  Primary Diagnosis & Pertinent Problem List: The primary encounter diagnosis was Chronic low back pain (Location of Primary Source of Pain) (Bilateral) (L>R). Diagnoses of Chronic shoulder pain (Left), Chronic lumbar radicular pain (Location of Secondary source of pain) (Left) (L5 Dermatome),  Chronic lower extremity pain (Location of Secondary source of pain) (Left), Chronic pain syndrome, Long term prescription opiate use, and Opiate use were also pertinent to this visit.  Status Diagnosis  Recurring Worsening Controlled 1. Chronic low back pain (Location of Primary Source of Pain) (Bilateral) (L>R)   2. Chronic shoulder pain (Left)   3. Chronic lumbar radicular pain (Location  of Secondary source of pain) (Left) (L5 Dermatome)   4. Chronic lower extremity pain (Location of Secondary source of pain) (Left)   5. Chronic pain syndrome   6. Long term prescription opiate use   7. Opiate use     Problems updated and reviewed during this visit: Problem  Chronic shoulder pain (Left)   Plan of Care  Pharmacotherapy (Medications Ordered): Meds ordered this encounter  Medications  . oxyCODONE (OXY IR/ROXICODONE) 5 MG immediate release tablet    Sig: Take 1 tablet (5 mg total) by mouth every 12 (twelve) hours as needed for severe pain.    Dispense:  60 tablet    Refill:  0    Fill one day early if pharmacy is closed on scheduled refill date. Do not fill until: 04/15/17 To last until: 05/15/17  . oxyCODONE (OXY IR/ROXICODONE) 5 MG immediate release tablet    Sig: Take 1 tablet (5 mg total) by mouth every 12 (twelve) hours as needed for severe pain.    Dispense:  60 tablet    Refill:  0    Fill one day early if pharmacy is closed on scheduled refill date. Do not fill until: 05/15/17 To last until: 06/14/17  . oxyCODONE (OXY IR/ROXICODONE) 5 MG immediate release tablet    Sig: Take 1 tablet (5 mg total) by mouth every 12 (twelve) hours as needed for severe pain.    Dispense:  60 tablet    Refill:  0    Fill one day early if pharmacy is closed on scheduled refill date. Do not fill until: 06/14/17 To last until: 07/14/17   New Prescriptions   OXYCODONE (OXY IR/ROXICODONE) 5 MG IMMEDIATE RELEASE TABLET    Take 1 tablet (5 mg total) by mouth every 12 (twelve) hours as needed for  severe pain.   OXYCODONE (OXY IR/ROXICODONE) 5 MG IMMEDIATE RELEASE TABLET    Take 1 tablet (5 mg total) by mouth every 12 (twelve) hours as needed for severe pain.   OXYCODONE (OXY IR/ROXICODONE) 5 MG IMMEDIATE RELEASE TABLET    Take 1 tablet (5 mg total) by mouth every 12 (twelve) hours as needed for severe pain.   Medications administered today: Ms. Hsia had no medications administered during this visit. Lab-work, procedure(s), and/or referral(s): Orders Placed This Encounter  Procedures  . SHOULDER INJECTION  . Lumbar Epidural Injection  . DG Shoulder Left   Imaging and/or referral(s): None  Interventional therapies: Planned, scheduled, and/or pending:   Palliative left-sided L4-5 vs L5-S1 lumbar epidural steroid injection, without sedation. Diagnostic left intra-articular shoulder joint injection    Considering:   Palliative left-sided L5-S1 lumbar epidural steroid injection   Palliative PRN treatment(s):   Palliative left-sided L5-S1 lumbar epidural steroid injection   Provider-requested follow-up: Return in about 3 months (around 07/01/2017) for procedure (w/ sedation):, by MD, in addition, Med-Mgmt, (3 mo), by NP.  Future Appointments Date Time Provider [place]  07/01/2017 9:30 AM Vevelyn Francois, NP [facility] None   Primary Care Physician: [name], MD Location: [facility] Outpatient Pain Management Facility Note by: Kathlen Brunswick. Dossie Arbour, M.D, DABA, DABAPM, DABPM, DABIPP, FIPP Date: 03/31/2017; Time: 12:00 PM  Patient instructions provided during this appointment: Patient Instructions   You were given 3 prescriptions for oxycodone today.  You were instructed to get xrays today at the medical mall.  You were given pre procedure instructions today for a procedure without sedation at your request.  Dr Dossie Arbour notified of your request.  _______________________________________________________________  Preparing for  Procedure with Sedation Instructions: . Oral  Intake: Do not eat or drink anything for at least 8 hours prior to your procedure. . Transportation: Public transportation is not allowed. Bring an adult driver. The driver must be physically present in our waiting room before any procedure can be started. Marland Kitchen Physical Assistance: Bring an adult physically capable of assisting you, in the event you need help. This adult should keep you company at home for at least 6 hours after the procedure. . Blood Pressure Medicine: Take your blood pressure medicine with a sip of water the morning of the procedure. . Blood thinners:  . Diabetics on insulin: Notify the staff so that you can be scheduled 1st case in the morning. If your diabetes requires high dose insulin, take only  of your normal insulin dose the morning of the procedure and notify the staff that you have done so. . Preventing infections: Shower with an antibacterial soap the morning of your procedure. . Build-up your immune system: Take 1000 mg of Vitamin C with every meal (3 times a day) the day prior to your procedure. Marland Kitchen Antibiotics: Inform the staff if you have a condition or reason that requires you to take antibiotics before dental procedures. . Pregnancy: If you are pregnant, call and cancel the procedure. . Sickness: If you have a cold, fever, or any active infections, call and cancel the procedure. . Arrival: You must be in the facility at least 30 minutes prior to your scheduled procedure. . Children: Do not bring children with you. . Dress appropriately: Bring dark clothing that you would not mind if they get stained. . Valuables: Do not bring any jewelry or valuables. Procedure appointments are reserved for interventional treatments only. Marland Kitchen No Prescription Refills. . No medication changes will be discussed during procedure appointments. . No disability issues will be  discussed. ______________________________________________________________________________________________  ____________________________________________________________________________________________  Medication Rules  Applies to: All patients receiving prescriptions (written or electronic).  Pharmacy of record: Pharmacy where electronic prescriptions will be sent. If written prescriptions are taken to a different pharmacy, please inform the nursing staff. The pharmacy listed in the electronic medical record should be the one where you would like electronic prescriptions to be sent.  Prescription refills: Only during scheduled appointments. Applies to both, written and electronic prescriptions.  NOTE: The following applies primarily to controlled substances (Opioid Pain Medications)  Patient's responsibilities: 1. Pain Pills: Bring all pain pills to every appointment (except for procedure appointments). 2. Pill Bottles: Bring pills in original pharmacy bottle. Always bring newest bottle. Bring bottle, even if empty. 3. Medication refills: You are responsible for knowing and keeping track of what medications you need refilled. The day before your appointment, write a list of all prescriptions that need to be refilled. Bring that list to your appointment and give it to the admitting nurse. Prescriptions will be written only during appointments. If you forget a medication, it will not be "Called in", "Faxed", or "electronically sent". You will need to get another appointment to get these prescribed. 4. Prescription Accuracy: You are responsible for carefully inspecting your prescriptions before leaving our office. Have the discharge nurse carefully go over each prescription with you, before taking them home. Make sure that your name is accurately spelled, that your address is correct. Check the name and dose of your medication to make sure it is accurate. Check the number of pills, and the written  instructions to make sure they are clear and accurate. Make sure that you are  given enough medication to last until your next medication refill appointment. 5. Taking Medication: Take medication as prescribed. Never take more pills than instructed. Never take medication more frequently than prescribed. Taking less pills or less frequently is permitted and encouraged, when it comes to controlled substances (written prescriptions).  6. Inform other Doctors: Always inform, all of your healthcare providers, of all the medications you take. 7. Pain Medication from other Providers: You are not allowed to accept any additional pain medication from any other Doctor or Healthcare provider. There are two exceptions to this rule. (see below) In the event that you require additional pain medication, you are responsible for notifying us, as stated below. 8. Medication Agreement: You are responsible for carefully reading and following our Medication Agreement. This must be signed before receiving any prescriptions from our practice. Safely store a copy of your signed Agreement. Violations to the Agreement will result in no further prescriptions. (Additional copies of our Medication Agreement are available upon request.) 9. Laws, Rules, & Regulations: All patients are expected to follow all Federal and [employer], Rules, [employer]. Ignorance of the Laws does not constitute a valid excuse.  Exceptions: There are only two exceptions to the rule of not receiving pain medications from other Healthcare Providers. 1. Exception #1 (Emergencies): In the event of an emergency (i.e.: accident requiring emergency care), you are allowed to receive additional pain medication. However, you are responsible for: As soon as you are able, call our office [phone], at any time of the day or night, and leave a message stating your name, the date and nature of the emergency, and the name and dose of the medication  prescribed. In the event that your call is answered by a member of our staff, make sure to document and save the date, time, and the name of the person that took your information.  2. Exception #2 (Planned Surgery): In the event that you are scheduled by another doctor or dentist to have any type of surgery or procedure, you are allowed (for a period no longer than 30 days), to receive additional pain medication, for the acute post-op pain. However, in this case, you are responsible for picking up a copy of our "Post-op Pain Management for Surgeons" handout, and giving it to your surgeon or dentist. This document is available at our office, and does not require an appointment to obtain it. Simply go to our office during business hours (Monday-Thursday from 8:00 AM to 4:00 PM) (Friday 8:00 AM to 12:00 Noon) or if you have a scheduled appointment with [place], prior to your surgery, and ask for it by name. In addition, you will need to provide [place] with your name, name of your surgeon, type of surgery, and date of procedure or surgery.  _____________________________________________________________________________________________  Pain Score  Introduction: The pain score used by this practice is the Verbal Numerical Rating Scale (VNRS-11). This is an 11-point scale. It is for adults and children 10 years or older. There are significant differences in how the pain score is reported, used, and applied. Forget everything you learned in the past and learn this scoring system.  General Information: The scale should reflect your current level of pain. Unless you are specifically asked for the level of your worst pain, or your average pain. If you are asked for one of these two, then it should be understood that it is over the past 24 hours.  Basic Activities of Daily Living (ADL): Personal hygiene, dressing,  eating, transferring, and using restroom.  Instructions: Most patients tend to report their level of pain as a  combination of two factors, their physical pain and their psychosocial pain. This last one is also known as "suffering" and it is reflection of how physical pain affects you socially and psychologically. From now on, report them separately. From this point on, when asked to report your pain level, report only your physical pain. Use the following table for reference.  Pain Clinic Pain Levels (0-5/10)  Pain Level Score Description  No Pain 0   Mild pain 1 Nagging, annoying, but does not interfere with basic activities of daily living (ADL). Patients are able to eat, bathe, get dressed, toileting (being able to get on and off the toilet and perform personal hygiene functions), transfer (move in and out of bed or a chair without assistance), and maintain continence (able to control bladder and bowel functions). Blood pressure and heart rate are unaffected. A normal heart rate for a healthy adult ranges from 60 to 100 bpm (beats per minute).   Mild to moderate pain 2 Noticeable and distracting. Impossible to hide from other people. More frequent flare-ups. Still possible to adapt and function close to normal. It can be very annoying and may have occasional stronger flare-ups. With discipline, patients may get used to it and adapt.   Moderate pain 3 Interferes significantly with activities of daily living (ADL). It becomes difficult to feed, bathe, get dressed, get on and off the toilet or to perform personal hygiene functions. Difficult to get in and out of bed or a chair without assistance. Very distracting. With effort, it can be ignored when deeply involved in activities.   Moderately severe pain 4 Impossible to ignore for more than a few minutes. With effort, patients may still be able to manage work or participate in some social activities. Very difficult to concentrate. Signs of autonomic nervous system discharge are evident: dilated pupils (mydriasis); mild sweating (diaphoresis); sleep interference.  Heart rate becomes elevated (>115 bpm). Diastolic blood pressure (lower number) rises above 100 mmHg. Patients find relief in laying down and not moving.   Severe pain 5 Intense and extremely unpleasant. Associated with frowning face and frequent crying. Pain overwhelms the senses.  Ability to do any activity or maintain social relationships becomes significantly limited. Conversation becomes difficult. Pacing back and forth is common, as getting into a comfortable position is nearly impossible. Pain wakes you up from deep sleep. Physical signs will be obvious: pupillary dilation; increased sweating; goosebumps; brisk reflexes; cold, clammy hands and feet; nausea, vomiting or dry heaves; loss of appetite; significant sleep disturbance with inability to fall asleep or to remain asleep. When persistent, significant weight loss is observed due to the complete loss of appetite and sleep deprivation.  Blood pressure and heart rate becomes significantly elevated. Caution: If elevated blood pressure triggers a pounding headache associated with blurred vision, then the patient should immediately seek attention at an urgent or emergency care unit, as these may be signs of an impending stroke.    Emergency Department Pain Levels (6-10/10)  Emergency Room Pain 6 Severely limiting. Requires emergency care and should not be seen or managed at an outpatient pain management facility. Communication becomes difficult and requires great effort. Assistance to reach the emergency department may be required. Facial flushing and profuse sweating along with potentially dangerous increases in heart rate and blood pressure will be evident.   Distressing pain 7 Self-care is very difficult. Assistance is required  to transport, or use restroom. Assistance to reach the emergency department will be required. Tasks requiring coordination, such as bathing and getting dressed become very difficult.   Disabling pain 8 Self-care is no  longer possible. At this level, pain is disabling. The individual is unable to do even the most "basic" activities such as walking, eating, bathing, dressing, transferring to a bed, or toileting. Fine motor skills are lost. It is difficult to think clearly.   Incapacitating pain 9 Pain becomes incapacitating. Thought processing is no longer possible. Difficult to remember your own name. Control of movement and coordination are lost.   The worst pain imaginable 10 At this level, most patients pass out from pain. When this level is reached, collapse of the autonomic nervous system occurs, leading to a sudden drop in blood pressure and heart rate. This in turn results in a temporary and dramatic drop in blood flow to the brain, leading to a loss of consciousness. Fainting is one of the body's self defense mechanisms. Passing out puts the brain in a calmed state and causes it to shut down for a while, in order to begin the healing process.    Summary: 1. Refer to this scale when providing [place] with your pain level. 2. Be accurate and careful when reporting your pain level. This will help with your care. 3. Over-reporting your pain level will lead to loss of credibility. 4. Even a level of 1/10 means that there is pain and will be treated at our facility. 5. High, inaccurate reporting will be documented as "Symptom Exaggeration", leading to loss of credibility and suspicions of possible secondary gains such as obtaining more narcotics, or wanting to appear disabled, for fraudulent reasons. 6. Only pain levels of 5 or below will be seen at our facility. 7. Pain levels of 6 and above will be sent to the Emergency Department and the appointment cancelled. _____________________________________________________________________________________________  Preparing for your procedure (without sedation) Instructions: . Oral Intake: Do not eat or drink anything for at least 3 hours prior to your  procedure. . Transportation: Unless otherwise stated by your physician, you may drive yourself after the procedure. . Blood Pressure Medicine: Take your blood pressure medicine with a sip of water the morning of the procedure. . Insulin: Take only  of your normal insulin dose. . Preventing infections: Shower with an antibacterial soap the morning of your procedure. . Build-up your immune system: Take 1000 mg of Vitamin C with every meal (3 times a day) the day prior to your procedure. . Pregnancy: If you are pregnant, call and cancel the procedure. . Sickness: If you have a cold, fever, or any active infections, call and cancel the procedure. . Arrival: You must be in the facility at least 30 minutes prior to your scheduled procedure. . Children: Do not bring any children with you. . Dress appropriately: Bring dark clothing that you would not mind if they get stained. . Valuables: Do not bring any jewelry or valuables. Procedure appointments are reserved for interventional treatments only. Marland Kitchen No Prescription Refills. . No medication changes will be discussed during procedure appointments. No disability issues will be discussed.Epidural Steroid Injection Patient Information  Description: The epidural space surrounds the nerves as they exit the spinal cord.  In some patients, the nerves can be compressed and inflamed by a bulging disc or a tight spinal canal (spinal stenosis).  By injecting steroids into the epidural space, we can bring irritated nerves into direct contact with a potentially  helpful medication.  These steroids act directly on the irritated nerves and can reduce swelling and inflammation which often leads to decreased pain.  Epidural steroids may be injected anywhere along the spine and from the neck to the low back depending upon the location of your pain.   After numbing the skin with local anesthetic (like Novocaine), a small needle is passed into the epidural space slowly.  You  may experience a sensation of pressure while this is being done.  The entire block usually last less than 10 minutes.  Conditions which may be treated by epidural steroids:  Low back and leg pain Neck and arm pain Spinal stenosis Post-laminectomy syndrome Herpes zoster (shingles) pain Pain from compression fractures  Preparation for the injection:  Do not eat any solid food or dairy products within 8 hours of your appointment.  You may drink clear liquids up to 3 hours before appointment.  Clear liquids include water, black coffee, juice or soda.  No milk or cream please. You may take your regular medication, including pain medications, with a sip of water before your appointment  Diabetics should hold regular insulin (if taken separately) and take 1/2 normal NPH dos the morning of the procedure.  Carry some sugar containing items with you to your appointment. A driver must accompany you and be prepared to drive you home after your procedure.  Bring all your current medications with your. An IV may be inserted and sedation may be given at the discretion of the physician.   A blood pressure cuff, EKG and other monitors will often be applied during the procedure.  Some patients may need to have extra oxygen administered for a short period. You will be asked to provide medical information, including your allergies, prior to the procedure.  We must know immediately if you are taking blood thinners (like Coumadin/Warfarin)  Or if you are allergic to IV iodine contrast (dye). We must know if you could possible be pregnant.  Possible side-effects: Bleeding from needle site Infection (rare, may require surgery) Nerve injury (rare) Numbness & tingling (temporary) Difficulty urinating (rare, temporary) Spinal headache ( a headache worse with upright posture) Light -headedness (temporary) Pain at injection site (several days) Decreased blood pressure (temporary) Weakness in arm/leg  (temporary) Pressure sensation in back/neck (temporary)  Call if you experience: Fever/chills associated with headache or increased back/neck pain. Headache worsened by an upright position. New onset weakness or numbness of an extremity below the injection site Hives or difficulty breathing (go to the emergency room) Inflammation or drainage at the infection site Severe back/neck pain Any new symptoms which are concerning to you  Please note:  Although the local anesthetic injected can often make your back or neck feel good for several hours after the injection, the pain will likely return.  It takes 3-7 days for steroids to work in the epidural space.  You may not notice any pain relief for at least that one week.  If effective, we will often do a series of three injections spaced 3-6 weeks apart to maximally decrease your pain.  After the initial series, we generally will wait several months before considering a repeat injection of the same type.  If you have any questions, please call [phone] . [facility]

## 2017-03-31 NOTE — Progress Notes (Signed)
Nursing Pain Medication Assessment:  Safety precautions to be maintained throughout the outpatient stay will include: orient to surroundings, keep bed in low position, maintain call bell within reach at all times, provide assistance with transfer out of bed and ambulation.  Medication Inspection Compliance: Pill count conducted under aseptic conditions, in front of the patient. Neither the pills nor the bottle was removed from the patient's sight at any time. Once count was completed pills were immediately returned to the patient in their original bottle.  Medication: Hydrocodone/APAP Pill/Patch Count: 3 of 45 pills remain Pill/Patch Appearance: Markings consistent with prescribed medication Bottle Appearance: Standard pharmacy container. Clearly labeled. Filled Date 04 /19 / 2018 Last Medication intake:  Today

## 2017-03-31 NOTE — Patient Instructions (Addendum)
You were given 3 prescriptions for oxycodone today.  [facility]  You were given pre procedure instructions today for a procedure without sedation at your request.  Dr Laban Emperor notified of your request.  _______________________________________________________________  Preparing for Procedure with Sedation Instructions: . Oral Intake: Do not eat or drink anything for at least 8 hours prior to your procedure. . Transportation: Public transportation is not allowed. Bring an adult driver. The driver must be physically present in our waiting room before any procedure can be started. Marland Kitchen Physical Assistance: Bring an adult physically capable of assisting you, in the event you need help. This adult should keep you company at home for at least 6 hours after the procedure. . Blood Pressure Medicine: Take your blood pressure medicine with a sip of water the morning of the procedure. . Blood thinners:  . Diabetics on insulin: Notify the staff so that you can be scheduled 1st case in the morning. If your diabetes requires high dose insulin, take only  of your normal insulin dose the morning of the procedure and notify the staff that you have done so. . Preventing infections: Shower with an antibacterial soap the morning of your procedure. . Build-up your immune system: Take 1000 mg of Vitamin C with every meal (3 times a day) the day prior to your procedure. Marland Kitchen Antibiotics: Inform the staff if you have a condition or reason that requires you to take antibiotics before dental procedures. . Pregnancy: If you are pregnant, call and cancel the procedure. . Sickness: If you have a cold, fever, or any active infections, call and cancel the procedure. . Arrival: You must be in the facility at least 30 minutes prior to your scheduled procedure. . Children: Do not bring children with you. . Dress appropriately: Bring dark clothing that you would not mind if they get  stained. . Valuables: Do not bring any jewelry or valuables. Procedure appointments are reserved for interventional treatments only. Marland Kitchen No Prescription Refills. . No medication changes will be discussed during procedure appointments. . No disability issues will be discussed. ______________________________________________________________________________________________  ____________________________________________________________________________________________  Medication Rules  Applies to: All patients receiving prescriptions (written or electronic).  Pharmacy of record: Pharmacy where electronic prescriptions will be sent. If written prescriptions are taken to a different pharmacy, please inform the nursing staff. The pharmacy listed in the electronic medical record should be the one where you would like electronic prescriptions to be sent.  Prescription refills: Only during scheduled appointments. Applies to both, written and electronic prescriptions.  NOTE: The following applies primarily to controlled substances (Opioid Pain Medications)  Patient's responsibilities: 1. Pain Pills: Bring all pain pills to every appointment (except for procedure appointments). 2. Pill Bottles: Bring pills in original pharmacy bottle. Always bring newest bottle. Bring bottle, even if empty. 3. Medication refills: You are responsible for knowing and keeping track of what medications you need refilled. The day before your appointment, write a list of all prescriptions that need to be refilled. Bring that list to your appointment and give it to the admitting nurse. Prescriptions will be written only during appointments. If you forget a medication, it will not be "Called in", "Faxed", or "electronically sent". You will need to get another appointment to get these prescribed. 4. Prescription Accuracy: You are responsible for carefully inspecting your prescriptions before leaving our office. Have the discharge  nurse carefully go over each prescription with you, before taking them home. Make sure  that your name is accurately spelled, that your address is correct. Check the name and dose of your medication to make sure it is accurate. Check the number of pills, and the written instructions to make sure they are clear and accurate. Make sure that you are given enough medication to last until your next medication refill appointment. 5. Taking Medication: Take medication as prescribed. Never take more pills than instructed. Never take medication more frequently than prescribed. Taking less pills or less frequently is permitted and encouraged, when it comes to controlled substances (written prescriptions).  6. Inform other Doctors: Always inform, all of your healthcare providers, of all the medications you take. 7. Pain Medication from other Providers: You are not allowed to accept any additional pain medication from any other Doctor or Healthcare provider. There are two exceptions to this rule. (see below) In the event that you require additional pain medication, you are responsible for notifying us, as stated below. 8. Medication Agreement: You are responsible for carefully reading and following our Medication Agreement. This must be signed before receiving any prescriptions from our practice. Safely store a copy of your signed Agreement. Violations to the Agreement will result in no further prescriptions. (Additional copies of our Medication Agreement are available upon request.) 9. Laws, Rules, & Regulations: All patients are expected to follow all [address] and [employer], Rules, [place] [employer]. Ignorance of the Laws does not constitute a valid excuse.  Exceptions: There are only two exceptions to the rule of not receiving pain medications from other Healthcare Providers. 1. Exception #1 (Emergencies): In the event of an emergency (i.e.: accident requiring emergency care), you are allowed to receive  additional pain medication. However, you are responsible for: As soon as you are able, call our office [phone], at any time of the day or night, and leave a message stating your name, the date and nature of the emergency, and the name and dose of the medication prescribed. In the event that your call is answered by a member of our staff, make sure to document and save the date, time, and the name of the person that took your information.  2. Exception #2 (Planned Surgery): In the event that you are scheduled by another doctor or dentist to have any type of surgery or procedure, you are allowed (for a period no longer than 30 days), to receive additional pain medication, for the acute post-op pain. However, in this case, you are responsible for picking up a copy of our "Post-op Pain Management for Surgeons" handout, and giving it to your surgeon or dentist. This document is available at our office, and does not require an appointment to obtain it. Simply go to our office during business hours (Monday-Thursday from 8:00 AM to 4:00 PM) (Friday 8:00 AM to 12:00 Noon) or if you have a scheduled appointment with [place], prior to your surgery, and ask for it by name. In addition, you will need to provide [place] with your name, name of your surgeon, type of surgery, and date of procedure or surgery.  _____________________________________________________________________________________________  Pain Score  Introduction: The pain score used by this practice is the Verbal Numerical Rating Scale (VNRS-11). This is an 11-point scale. It is for adults and children 10 years or older. There are significant differences in how the pain score is reported, used, and applied. Forget everything you learned in the past and learn this scoring system.  General Information: The scale should reflect your current level of pain. Unless  you are specifically asked for the level of your worst pain, or your average pain. If you are asked  for one of these two, then it should be understood that it is over the past 24 hours.  Basic Activities of Daily Living (ADL): Personal hygiene, dressing, eating, transferring, and using restroom.  Instructions: Most patients tend to report their level of pain as a combination of two factors, their physical pain and their psychosocial pain. This last one is also known as "suffering" and it is reflection of how physical pain affects you socially and psychologically. From now on, report them separately. From this point on, when asked to report your pain level, report only your physical pain. Use the following table for reference.  Pain Clinic Pain Levels (0-5/10)  Pain Level Score Description  No Pain 0   Mild pain 1 Nagging, annoying, but does not interfere with basic activities of daily living (ADL). Patients are able to eat, bathe, get dressed, toileting (being able to get on and off the toilet and perform personal hygiene functions), transfer (move in and out of bed or a chair without assistance), and maintain continence (able to control bladder and bowel functions). Blood pressure and heart rate are unaffected. A normal heart rate for a healthy adult ranges from 60 to 100 bpm (beats per minute).   Mild to moderate pain 2 Noticeable and distracting. Impossible to hide from other people. More frequent flare-ups. Still possible to adapt and function close to normal. It can be very annoying and may have occasional stronger flare-ups. With discipline, patients may get used to it and adapt.   Moderate pain 3 Interferes significantly with activities of daily living (ADL). It becomes difficult to feed, bathe, get dressed, get on and off the toilet or to perform personal hygiene functions. Difficult to get in and out of bed or a chair without assistance. Very distracting. With effort, it can be ignored when deeply involved in activities.   Moderately severe pain 4 Impossible to ignore for more than a few  minutes. With effort, patients may still be able to manage work or participate in some social activities. Very difficult to concentrate. Signs of autonomic nervous system discharge are evident: dilated pupils (mydriasis); mild sweating (diaphoresis); sleep interference. Heart rate becomes elevated (>115 bpm). Diastolic blood pressure (lower number) rises above 100 mmHg. Patients find relief in laying down and not moving.   Severe pain 5 Intense and extremely unpleasant. Associated with frowning face and frequent crying. Pain overwhelms the senses.  Ability to do any activity or maintain social relationships becomes significantly limited. Conversation becomes difficult. Pacing back and forth is common, as getting into a comfortable position is nearly impossible. Pain wakes you up from deep sleep. Physical signs will be obvious: pupillary dilation; increased sweating; goosebumps; brisk reflexes; cold, clammy hands and feet; nausea, vomiting or dry heaves; loss of appetite; significant sleep disturbance with inability to fall asleep or to remain asleep. When persistent, significant weight loss is observed due to the complete loss of appetite and sleep deprivation.  Blood pressure and heart rate becomes significantly elevated. Caution: If elevated blood pressure triggers a pounding headache associated with blurred vision, then the patient should immediately seek attention at an urgent or emergency care unit, as these may be signs of an impending stroke.    Emergency Department Pain Levels (6-10/10)  Emergency Room Pain 6 Severely limiting. Requires emergency care and should not be seen or managed at an outpatient pain management facility.  Communication becomes difficult and requires great effort. Assistance to reach the emergency department may be required. Facial flushing and profuse sweating along with potentially dangerous increases in heart rate and blood pressure will be evident.   Distressing pain 7  Self-care is very difficult. Assistance is required to transport, or use restroom. Assistance to reach the emergency department will be required. Tasks requiring coordination, such as bathing and getting dressed become very difficult.   Disabling pain 8 Self-care is no longer possible. At this level, pain is disabling. The individual is unable to do even the most "basic" activities such as walking, eating, bathing, dressing, transferring to a bed, or toileting. Fine motor skills are lost. It is difficult to think clearly.   Incapacitating pain 9 Pain becomes incapacitating. Thought processing is no longer possible. Difficult to remember your own name. Control of movement and coordination are lost.   The worst pain imaginable 10 At this level, most patients pass out from pain. When this level is reached, collapse of the autonomic nervous system occurs, leading to a sudden drop in blood pressure and heart rate. This in turn results in a temporary and dramatic drop in blood flow to the brain, leading to a loss of consciousness. Fainting is one of the body's self defense mechanisms. Passing out puts the brain in a calmed state and causes it to shut down for a while, in order to begin the healing process.    Summary: 1. Refer to this scale when providing [place] with your pain level. 2. Be accurate and careful when reporting your pain level. This will help with your care. 3. Over-reporting your pain level will lead to loss of credibility. 4. Even a level of 1/10 means that there is pain and will be treated at our facility. 5. High, inaccurate reporting will be documented as "Symptom Exaggeration", leading to loss of credibility and suspicions of possible secondary gains such as obtaining more narcotics, or wanting to appear disabled, for fraudulent reasons. 6. Only pain levels of 5 or below will be seen at our facility. 7. Pain levels of 6 and above will be sent to the Emergency Department and the appointment  cancelled. _____________________________________________________________________________________________  Preparing for your procedure (without sedation) Instructions: . Oral Intake: Do not eat or drink anything for at least 3 hours prior to your procedure. . Transportation: Unless otherwise stated by your physician, you may drive yourself after the procedure. . Blood Pressure Medicine: Take your blood pressure medicine with a sip of water the morning of the procedure. . Insulin: Take only  of your normal insulin dose. . Preventing infections: Shower with an antibacterial soap the morning of your procedure. . Build-up your immune system: Take 1000 mg of Vitamin C with every meal (3 times a day) the day prior to your procedure. . Pregnancy: If you are pregnant, call and cancel the procedure. . Sickness: If you have a cold, fever, or any active infections, call and cancel the procedure. . Arrival: You must be in the facility at least 30 minutes prior to your scheduled procedure. . Children: Do not bring any children with you. . Dress appropriately: Bring dark clothing that you would not mind if they get stained. . Valuables: Do not bring any jewelry or valuables. Procedure appointments are reserved for interventional treatments only. Marland Kitchen No Prescription Refills. . No medication changes will be discussed during procedure appointments. No disability issues will be discussed.Epidural Steroid Injection Patient Information  Description: The epidural space surrounds the nerves  as they exit the spinal cord.  In some patients, the nerves can be compressed and inflamed by a bulging disc or a tight spinal canal (spinal stenosis).  By injecting steroids into the epidural space, we can bring irritated nerves into direct contact with a potentially helpful medication.  These steroids act directly on the irritated nerves and can reduce swelling and inflammation which often leads to decreased pain.  Epidural  steroids may be injected anywhere along the spine and from the neck to the low back depending upon the location of your pain.   After numbing the skin with local anesthetic (like Novocaine), a small needle is passed into the epidural space slowly.  You may experience a sensation of pressure while this is being done.  The entire block usually last less than 10 minutes.  Conditions which may be treated by epidural steroids:  Low back and leg pain Neck and arm pain Spinal stenosis Post-laminectomy syndrome Herpes zoster (shingles) pain Pain from compression fractures  Preparation for the injection:  Do not eat any solid food or dairy products within 8 hours of your appointment.  You may drink clear liquids up to 3 hours before appointment.  Clear liquids include water, black coffee, juice or soda.  No milk or cream please. You may take your regular medication, including pain medications, with a sip of water before your appointment  Diabetics should hold regular insulin (if taken separately) and take 1/2 normal NPH dos the morning of the procedure.  Carry some sugar containing items with you to your appointment. A driver must accompany you and be prepared to drive you home after your procedure.  Bring all your current medications with your. An IV may be inserted and sedation may be given at the discretion of the physician.   A blood pressure cuff, EKG and other monitors will often be applied during the procedure.  Some patients may need to have extra oxygen administered for a short period. You will be asked to provide medical information, including your allergies, prior to the procedure.  We must know immediately if you are taking blood thinners (like Coumadin/Warfarin)  Or if you are allergic to IV iodine contrast (dye). We must know if you could possible be pregnant.  Possible side-effects: Bleeding from needle site Infection (rare, may require surgery) Nerve injury (rare) Numbness &  tingling (temporary) Difficulty urinating (rare, temporary) Spinal headache ( a headache worse with upright posture) Light -headedness (temporary) Pain at injection site (several days) Decreased blood pressure (temporary) Weakness in arm/leg (temporary) Pressure sensation in back/neck (temporary)  Call if you experience: Fever/chills associated with headache or increased back/neck pain. Headache worsened by an upright position. New onset weakness or numbness of an extremity below the injection site Hives or difficulty breathing (go to the emergency room) Inflammation or drainage at the infection site Severe back/neck pain Any new symptoms which are concerning to you  Please note:  Although the local anesthetic injected can often make your back or neck feel good for several hours after the injection, the pain will likely return.  It takes 3-7 days for steroids to work in the epidural space.  You may not notice any pain relief for at least that one week.  If effective, we will often do a series of three injections spaced 3-6 weeks apart to maximally decrease your pain.  After the initial series, we generally will wait several months before considering a repeat injection of the same type.  If you have  any questions, please call [phone] . [facility]

## 2017-03-31 NOTE — Progress Notes (Signed)
Safety precautions to be maintained throughout the outpatient stay will include: orient to surroundings, keep bed in low position, maintain call bell within reach at all times, provide assistance with transfer out of bed and ambulation.  

## 2017-04-01 NOTE — Progress Notes (Signed)
Results were reviewed and found to be: mildly abnormal  No acute injury or pathology identified  Review would suggest interventional pain management techniques may be of benefit 

## 2017-04-09 DIAGNOSIS — F329 Major depressive disorder, single episode, unspecified: Secondary | ICD-10-CM | POA: Diagnosis not present

## 2017-04-09 DIAGNOSIS — K219 Gastro-esophageal reflux disease without esophagitis: Secondary | ICD-10-CM | POA: Diagnosis not present

## 2017-04-09 DIAGNOSIS — F5104 Psychophysiologic insomnia: Secondary | ICD-10-CM | POA: Diagnosis not present

## 2017-04-15 ENCOUNTER — Ambulatory Visit (HOSPITAL_BASED_OUTPATIENT_CLINIC_OR_DEPARTMENT_OTHER): Payer: PPO | Admitting: Pain Medicine

## 2017-04-15 ENCOUNTER — Ambulatory Visit
Admission: RE | Admit: 2017-04-15 | Discharge: 2017-04-15 | Disposition: A | Discharge status: Home / Self Care | Payer: PPO | Source: Ambulatory Visit | Attending: Pain Medicine | Admitting: Pain Medicine

## 2017-04-15 ENCOUNTER — Encounter: Payer: Self-pay | Admitting: Pain Medicine

## 2017-04-15 VITALS — BP 110/72 | HR 64 | Temp 98.2°F | Resp 17 | Ht 62.0 in | Wt 175.0 lb

## 2017-04-15 DIAGNOSIS — M545 Low back pain: Secondary | ICD-10-CM | POA: Insufficient documentation

## 2017-04-15 DIAGNOSIS — M48062 Spinal stenosis, lumbar region with neurogenic claudication: Secondary | ICD-10-CM | POA: Insufficient documentation

## 2017-04-15 DIAGNOSIS — M5416 Radiculopathy, lumbar region: Secondary | ICD-10-CM | POA: Diagnosis not present

## 2017-04-15 DIAGNOSIS — M19012 Primary osteoarthritis, left shoulder: Secondary | ICD-10-CM | POA: Insufficient documentation

## 2017-04-15 DIAGNOSIS — Z9889 Other specified postprocedural states: Secondary | ICD-10-CM | POA: Diagnosis not present

## 2017-04-15 DIAGNOSIS — M4316 Spondylolisthesis, lumbar region: Secondary | ICD-10-CM

## 2017-04-15 DIAGNOSIS — Z90710 Acquired absence of both cervix and uterus: Secondary | ICD-10-CM | POA: Diagnosis not present

## 2017-04-15 DIAGNOSIS — M25512 Pain in left shoulder: Secondary | ICD-10-CM | POA: Diagnosis not present

## 2017-04-15 DIAGNOSIS — M79605 Pain in left leg: Secondary | ICD-10-CM

## 2017-04-15 DIAGNOSIS — G8929 Other chronic pain: Secondary | ICD-10-CM

## 2017-04-15 DIAGNOSIS — Z886 Allergy status to analgesic agent status: Secondary | ICD-10-CM | POA: Insufficient documentation

## 2017-04-15 MED ORDER — TRIAMCINOLONE ACETONIDE 40 MG/ML IJ SUSP
INTRAMUSCULAR | Status: AC
  Filled 2017-04-15: qty 1

## 2017-04-15 MED ORDER — LIDOCAINE HCL (PF) 1 % IJ SOLN
INTRAMUSCULAR | Status: AC
  Filled 2017-04-15: qty 5

## 2017-04-15 MED ORDER — ROPIVACAINE HCL 2 MG/ML IJ SOLN
2.0000 mL | Freq: Once | INTRAMUSCULAR | Status: AC
  Administered 2017-04-15: 10 mL via EPIDURAL

## 2017-04-15 MED ORDER — TRIAMCINOLONE ACETONIDE 40 MG/ML IJ SUSP
40.0000 mg | Freq: Once | INTRAMUSCULAR | Status: AC
  Administered 2017-04-15: 40 mg

## 2017-04-15 MED ORDER — SODIUM CHLORIDE 0.9 % IJ SOLN
INTRAMUSCULAR | Status: AC
  Filled 2017-04-15: qty 10

## 2017-04-15 MED ORDER — SODIUM CHLORIDE 0.9% FLUSH
2.0000 mL | Freq: Once | INTRAVENOUS | Status: AC
  Administered 2017-04-15: 10 mL

## 2017-04-15 MED ORDER — IOPAMIDOL (ISOVUE-M 200) INJECTION 41%
10.0000 mL | Freq: Once | INTRAMUSCULAR | Status: AC
  Administered 2017-04-15: 20 mL via EPIDURAL
  Filled 2017-04-15: qty 10

## 2017-04-15 MED ORDER — ROPIVACAINE HCL 2 MG/ML IJ SOLN
INTRAMUSCULAR | Status: AC
  Filled 2017-04-15: qty 10

## 2017-04-15 MED ORDER — LIDOCAINE HCL (PF) 1 % IJ SOLN
10.0000 mL | Freq: Once | INTRAMUSCULAR | Status: AC
  Administered 2017-04-15: 5 mL

## 2017-04-15 NOTE — Progress Notes (Signed)
Patient's Name: Cynthia Bennett  MRN: [id number]  Referring Provider: Jerl Mina, MD  DOB: 1933/08/05  PCP: Jerl Mina, MD  DOS: 04/15/2017  Note by: Sydnee Levans. Laban Emperor, MD  Service setting: Ambulatory outpatient  Location: [facility] (AMB) Pain Management Facility  Visit type: Procedure  Specialty: Interventional Pain Management  Patient type: Established   Primary Reason for Visit: Interventional Pain Management Treatment. CC: Back Pain (lower) and Shoulder Pain (left)  Procedure:  Anesthesia, Analgesia, Anxiolysis:  Type: Therapeutic Inter-Laminar Epidural Steroid Injection Region: Lumbar Level: L4-5 Level. Laterality: Left-Sided Paramedial  Type: Local Anesthesia Local Anesthetic: Lidocaine 1% Route: Infiltration ([place]/IM) IV Access: Declined Sedation: Declined  Indication(s): Analgesia          Indications: 1. Chronic lower extremity pain (Location of Secondary source of pain) (Left)   2. Chronic lumbar radicular pain (Location of Secondary source of pain) (Left) (L5 Dermatome)   3. Grade 1 spondylolisthesis of L4 over L5   4. Spinal stenosis of lumbar region with neurogenic claudication   5. Chronic shoulder pain (Left)   6. Osteoarthritis of shoulder (Left)    Pain Score: Pre-procedure: 8 /10 Post-procedure: 0-No pain/10  Pre-op Assessment:  Previous date of service: 04/09/17 Service provided: Evaluation Cynthia Bennett is a 83 y.o. (year old), female patient, seen today for interventional treatment. She  has a past surgical history that includes Esophagogastroduodenoscopy (N/A, 04/16/2015); Appendectomy; and Abdominal hysterectomy. Her primarily concern today is the Back Pain (lower) and Shoulder Pain (left)  Initial Vital Signs: Blood pressure 122/87, pulse 64, temperature 98.2 F (36.8 C), temperature source Oral, resp. rate 18, height 5\' 2"  (1.575 m), weight 175 lb (79.4 kg), SpO2 100 %. BMI: 32.01 kg/m  Risk Assessment: Allergies: Reviewed. She is allergic to ibuprofen and  nsaids.  Allergy Precautions: None required Coagulopathies: Reviewed. None identified.  Blood-thinner therapy: None at this time Active Infection(s): Reviewed. None identified. Cynthia Bennett is afebrile  Site Confirmation: Cynthia Bennett was asked to confirm the procedure and laterality before marking the site Procedure checklist: Completed Consent: Before the procedure and under the influence of no sedative(s), amnesic(s), or anxiolytics, the patient was informed of the treatment options, risks and possible complications. To fulfill our ethical and legal obligations, as recommended by the American Medical Association's Code of Ethics, I have informed the patient of my clinical impression; the nature and purpose of the treatment or procedure; the risks, benefits, and possible complications of the intervention; the alternatives, including doing nothing; the risk(s) and benefit(s) of the alternative treatment(s) or procedure(s); and the risk(s) and benefit(s) of doing nothing. The patient was provided information about the general risks and possible complications associated with the procedure. These may include, but are not limited to: failure to achieve desired goals, infection, bleeding, organ or nerve damage, allergic reactions, paralysis, and death. In addition, the patient was informed of those risks and complications associated to Spine-related procedures, such as failure to decrease pain; infection (i.e.: Meningitis, epidural or intraspinal abscess); bleeding (i.e.: epidural hematoma, subarachnoid hemorrhage, or any other type of intraspinal or peri-dural bleeding); organ or nerve damage (i.e.: Any type of peripheral nerve, nerve root, or spinal cord injury) with subsequent damage to sensory, motor, and/or autonomic systems, resulting in permanent pain, numbness, and/or weakness of one or several areas of the body; allergic reactions; (i.e.: anaphylactic reaction); and/or death. Furthermore, the patient was  informed of those risks and complications associated with the medications. These include, but are not limited to: allergic reactions (i.e.: anaphylactic or anaphylactoid  reaction(s)); adrenal axis suppression; blood sugar elevation that in diabetics may result in ketoacidosis or comma; water retention that in patients with history of congestive heart failure may result in shortness of breath, pulmonary edema, and decompensation with resultant heart failure; weight gain; swelling or edema; medication-induced neural toxicity; particulate matter embolism and blood vessel occlusion with resultant organ, and/or nervous system infarction; and/or aseptic necrosis of one or more joints. Finally, the patient was informed that Medicine is not an exact science; therefore, there is also the possibility of unforeseen or unpredictable risks and/or possible complications that may result in a catastrophic outcome. The patient indicated having understood very clearly. We have given the patient no guarantees and we have made no promises. Enough time was given to the patient to ask questions, all of which were answered to the patient's satisfaction. Cynthia Bennett has indicated that she wanted to continue with the procedure. Attestation: I, the ordering provider, attest that I have discussed with the patient the benefits, risks, side-effects, alternatives, likelihood of achieving goals, and potential problems during recovery for the procedure that I have provided informed consent. Date: 04/15/2017; Time: 1:06 PM  Pre-Procedure Preparation:  Monitoring: As per clinic protocol. Respiration, ETCO2, SpO2, BP, heart rate and rhythm monitor placed and checked for adequate function Safety Precautions: Patient was assessed for positional comfort and pressure points before starting the procedure. Time-out: I initiated and conducted the "Time-out" before starting the procedure, as per protocol. The patient was asked to participate by confirming  the accuracy of the "Time Out" information. Verification of the correct person, site, and procedure were performed and confirmed by me, the nursing staff, and the patient. "Time-out" conducted as per Joint Commission's Universal Protocol (UP.01.01.01). "Time-out" Date & Time: 04/15/2017; 1346 hrs.  Description of Procedure Process:   Position: Prone with head of the table was raised to facilitate breathing. Target Area: The interlaminar space, initially targeting the lower laminar border of the superior vertebral body. Approach: Paramedial approach. Area Prepped: Entire Posterior Lumbar Region Prepping solution: ChloraPrep (2% chlorhexidine gluconate and 70% isopropyl alcohol) Safety Precautions: Aspiration looking for blood return was conducted prior to all injections. At no point did we inject any substances, as a needle was being advanced. No attempts were made at seeking any paresthesias. Safe injection practices and needle disposal techniques used. Medications properly checked for expiration dates. SDV (single dose vial) medications used. Description of the Procedure: Protocol guidelines were followed. The procedure needle was introduced through the skin, ipsilateral to the reported pain, and advanced to the target area. Bone was contacted and the needle walked caudad, until the lamina was cleared. The epidural space was identified using "loss-of-resistance technique" with 2-3 ml of PF-NaCl (0.9% NSS), in a 5cc LOR glass syringe. Vitals:   04/15/17 1333 04/15/17 1347 04/15/17 1352 04/15/17 1354  BP: 112/66 101/74 (!) 103/59 110/72  Pulse:      Resp: 14 17 18 17   Temp:      TempSrc:      SpO2: 99% 96% 98% 95%  Weight:      Height:        Start Time: 1347 hrs. End Time: 1354 hrs. Materials:  Needle(s) Type: Epidural needle Gauge: 17G Length: 3.5-in Medication(s): We administered lidocaine (PF), triamcinolone acetonide, iopamidol, sodium chloride flush, and ropivacaine (PF) 2 mg/mL  (0.2%). Please see chart orders for dosing details.  Imaging Guidance (Spinal):  Type of Imaging Technique: Fluoroscopy Guidance (Spinal) Indication(s): Assistance in needle guidance and placement for procedures requiring needle  placement in or near specific anatomical locations not easily accessible without such assistance. Exposure Time: Please see nurses notes. Contrast: Before injecting any contrast, we confirmed that the patient did not have an allergy to iodine, shellfish, or radiological contrast. Once satisfactory needle placement was completed at the desired level, radiological contrast was injected. Contrast injected under live fluoroscopy. No contrast complications. See chart for type and volume of contrast used. Fluoroscopic Guidance: I was personally present during the use of fluoroscopy. "Tunnel Vision Technique" used to obtain the best possible view of the target area. Parallax error corrected before commencing the procedure. "Direction-depth-direction" technique used to introduce the needle under continuous pulsed fluoroscopy. Once target was reached, antero-posterior, oblique, and lateral fluoroscopic projection used confirm needle placement in all planes. Images permanently stored in EMR. Interpretation: I personally interpreted the imaging intraoperatively. Adequate needle placement confirmed in multiple planes. Appropriate spread of contrast into desired area was observed. No evidence of afferent or efferent intravascular uptake. No intrathecal or subarachnoid spread observed. Permanent images saved into the patient's record.  Antibiotic Prophylaxis:  Indication(s): None identified Antibiotic given: None  Post-operative Assessment:  EBL: None Complications: No immediate post-treatment complications observed by team, or reported by patient. Note: The patient tolerated the entire procedure well. A repeat set of vitals were taken after the procedure and the patient was kept under  observation following institutional policy, for this type of procedure. Post-procedural neurological assessment was performed, showing return to baseline, prior to discharge. The patient was provided with post-procedure discharge instructions, including a section on how to identify potential problems. Should any problems arise concerning this procedure, the patient was given instructions to immediately contact us, at any time, without hesitation. In any case, we plan to contact the patient by telephone for a follow-up status report regarding this interventional procedure. Comments:  No additional relevant information.  Plan of Care  Disposition: Discharge home  Discharge Date & Time: 04/15/2017; 1356 hrs.  Physician-requested Follow-up:  Return in about 2 weeks (around 04/29/2017) for NS procedure, (2 wks), by MD.  Future Appointments Date Time Provider Department Center  04/29/2017 9:00 AM Delano Metz, MD [facility] None  07/01/2017 9:30 AM Barbette Merino, NP [facility] None   Medications ordered for procedure: Meds ordered this encounter  Medications  . lidocaine (PF) (XYLOCAINE) 1 % injection 10 mL  . triamcinolone acetonide (KENALOG-40) injection 40 mg  . iopamidol (ISOVUE-M) 41 % intrathecal injection 10 mL  . sodium chloride flush (NS) 0.9 % injection 2 mL  . ropivacaine (PF) 2 mg/mL (0.2%) (NAROPIN) injection 2 mL   Medications administered: We administered lidocaine (PF), triamcinolone acetonide, iopamidol, sodium chloride flush, and ropivacaine (PF) 2 mg/mL (0.2%).  See the medical record for exact dosing, route, and time of administration.  Lab-work, Procedure(s), & Referral(s) Ordered: Orders Placed This Encounter  Procedures  . Lumbar Epidural Injection  . DG C-Arm 1-60 Min-No Report  . Informed Consent Details: Transcribe to consent form and obtain patient signature  . Provider attestation of informed consent for procedure/surgical case  . Verify informed consent  .  Discharge instructions  . Follow-up   Imaging Ordered: New Prescriptions   No medications on file   Primary Care Physician: Jerl Mina, MD Location: [facility] Note by: Sydnee Levans. Laban Emperor, M.D, DABA, DABAPM, DABPM, DABIPP, FIPP Date: 04/15/2017; Time: 2:11 PM  Disclaimer:  Medicine is not an Visual merchandiser. The only guarantee in medicine is that nothing is guaranteed. It is important to note  that the decision to proceed with this intervention was based on the information collected from the patient. The Data and conclusions were drawn from the patient's questionnaire, the interview, and the physical examination. Because the information was provided in large part by the patient, it cannot be guaranteed that it has not been purposely or unconsciously manipulated. Every effort has been made to obtain as much relevant data as possible for this evaluation. It is important to note that the conclusions that lead to this procedure are derived in large part from the available data. Always take into account that the treatment will also be dependent on availability of resources and existing treatment guidelines, considered by other Pain Management Practitioners as being common knowledge and practice, at the time of the intervention. For Medico-Legal purposes, it is also important to point out that variation in procedural techniques and pharmacological choices are the acceptable norm. The indications, contraindications, technique, and results of the above procedure should only be interpreted and judged by a Board-Certified Interventional Pain Specialist with extensive familiarity and expertise in the same exact procedure and technique.  Instructions provided at this appointment: Patient Instructions   ____________________________________________________________________________________________  Post-Procedure instructions Instructions:  Apply ice: Fill a plastic sandwich bag  with crushed ice. Cover it with a small towel and apply to injection site. Apply for 15 minutes then remove x 15 minutes. Repeat sequence on day of procedure, until you go to bed. The purpose is to minimize swelling and discomfort after procedure.  Apply heat: Apply heat to procedure site starting the day following the procedure. The purpose is to treat any soreness and discomfort from the procedure.  Food intake: Start with clear liquids (like water) and advance to regular food, as tolerated.   Physical activities: Keep activities to a minimum for the first 8 hours after the procedure.   Driving: If you have received any sedation, you are not allowed to drive for 24 hours after your procedure.  Blood thinner: Restart your blood thinner 6 hours after your procedure. (Only for those taking blood thinners)  Insulin: As soon as you can eat, you may resume your normal dosing schedule. (Only for those taking insulin)  Infection prevention: Keep procedure site clean and dry.  Post-procedure Pain Diary: Extremely important that this be done correctly and accurately. Recorded information will be used to determine the next step in treatment.  Pain evaluated is that of treated area only. Do not include pain from an untreated area.  Complete every hour, on the hour, for the initial 8 hours. Set an alarm to help you do this part accurately.  Do not go to sleep and have it completed later. It will not be accurate.  Follow-up appointment: Keep your follow-up appointment after the procedure. Usually 2 weeks for most procedures. (6 weeks in the case of radiofrequency.) Bring you pain diary.  Expect:  From numbing medicine (AKA: Local Anesthetics): Numbness or decrease in pain.  Onset: Full effect within 15 minutes of injected.  Duration: It will depend on the type of local anesthetic used. On the average, 1 to 8 hours.   From steroids: Decrease in swelling or inflammation. Once inflammation is  improved, relief of the pain will follow.  Onset of benefits: Depends on the amount of swelling present. The more swelling, the longer it will take for the benefits to be seen.   Duration: Steroids will stay in the system x 2 weeks. Duration of benefits will depend on multiple posibilities including persistent irritating  factors.  From procedure: Some discomfort is to be expected once the numbing medicine wears off. This should be minimal if ice and heat are applied as instructed. Call if:  You experience numbness and weakness that gets worse with time, as opposed to wearing off.  New onset bowel or bladder incontinence. (Spinal procedures only)  Emergency Numbers:  Durning business hours (Monday - Thursday, 8:00 AM - 4:00 PM) (Friday, 9:00 AM - 12:00 Noon): [phone]  After hours: [phone] ____________________________________________________________________________________________  ____________________________________________________________________________________________  Preparing for your procedure (without sedation) Instructions: . Oral Intake: Do not eat or drink anything for at least 3 hours prior to your procedure. . Transportation: Unless otherwise stated by your physician, you may drive yourself after the procedure. . Blood Pressure Medicine: Take your blood pressure medicine with a sip of water the morning of the procedure. . Blood thinners:  . Diabetics on insulin: Notify the staff so that you can be scheduled 1st case in the morning. If your diabetes requires high dose insulin, take only  of your normal insulin dose the morning of the procedure and notify the staff that you have done so. . Preventing infections: Shower with an antibacterial soap the morning of your procedure.  . Build-up your immune system: Take 1000 mg of Vitamin C with every meal (3 times a day) the day prior to your procedure. Marland Kitchen Antibiotics: Inform the staff if you have a condition or  reason that requires you to take antibiotics before dental procedures. . Pregnancy: If you are pregnant, call and cancel the procedure. . Sickness: If you have a cold, fever, or any active infections, call and cancel the procedure. . Arrival: You must be in the facility at least 30 minutes prior to your scheduled procedure. . Children: Do not bring any children with you. . Dress appropriately: Bring dark clothing that you would not mind if they get stained. . Valuables: Do not bring any jewelry or valuables. Procedure appointments are reserved for interventional treatments only. Marland Kitchen No Prescription Refills. . No medication changes will be discussed during procedure appointments. . No disability issues will be discussed. ____________________________________________________________________________________________  ____________________________________________________________________________________________  Pain Scale  Introduction: The pain score used by this practice is the Verbal Numerical Rating Scale (VNRS-11). This is an 11-point scale. It is for adults and children 10 years or older. There are significant differences in how the pain score is reported, used, and applied. Forget everything you learned in the past and learn this scoring system.  General Information: The scale should reflect your current level of pain. Unless you are specifically asked for the level of your worst pain, or your average pain. If you are asked for one of these two, then it should be understood that it is over the past 24 hours.  Basic Activities of Daily Living (ADL): Personal hygiene, dressing, eating, transferring, and using restroom.  Instructions: Most patients tend to report their level of pain as a combination of two factors, their physical pain and their psychosocial pain. This last one is also known as "suffering" and it is reflection of how physical pain affects you socially and psychologically. From now  on, report them separately. From this point on, when asked to report your pain level, report only your physical pain. Use the following table for reference.  Pain Clinic Pain Levels (0-5/10)  Pain Level Score  Description  No Pain 0   Mild pain 1 Nagging, annoying, but does not interfere with basic activities of daily living (  ADL). Patients are able to eat, bathe, get dressed, toileting (being able to get on and off the toilet and perform personal hygiene functions), transfer (move in and out of bed or a chair without assistance), and maintain continence (able to control bladder and bowel functions). Blood pressure and heart rate are unaffected. A normal heart rate for a healthy adult ranges from 60 to 100 bpm (beats per minute).   Mild to moderate pain 2 Noticeable and distracting. Impossible to hide from other people. More frequent flare-ups. Still possible to adapt and function close to normal. It can be very annoying and may have occasional stronger flare-ups. With discipline, patients may get used to it and adapt.   Moderate pain 3 Interferes significantly with activities of daily living (ADL). It becomes difficult to feed, bathe, get dressed, get on and off the toilet or to perform personal hygiene functions. Difficult to get in and out of bed or a chair without assistance. Very distracting. With effort, it can be ignored when deeply involved in activities.   Moderately severe pain 4 Impossible to ignore for more than a few minutes. With effort, patients may still be able to manage work or participate in some social activities. Very difficult to concentrate. Signs of autonomic nervous system discharge are evident: dilated pupils (mydriasis); mild sweating (diaphoresis); sleep interference. Heart rate becomes elevated (>115 bpm). Diastolic blood pressure (lower number) rises above 100 mmHg. Patients find relief in laying down and not moving.   Severe pain 5 Intense and extremely unpleasant.  Associated with frowning face and frequent crying. Pain overwhelms the senses.  Ability to do any activity or maintain social relationships becomes significantly limited. Conversation becomes difficult. Pacing back and forth is common, as getting into a comfortable position is nearly impossible. Pain wakes you up from deep sleep. Physical signs will be obvious: pupillary dilation; increased sweating; goosebumps; brisk reflexes; cold, clammy hands and feet; nausea, vomiting or dry heaves; loss of appetite; significant sleep disturbance with inability to fall asleep or to remain asleep. When persistent, significant weight loss is observed due to the complete loss of appetite and sleep deprivation.  Blood pressure and heart rate becomes significantly elevated. Caution: If elevated blood pressure triggers a pounding headache associated with blurred vision, then the patient should immediately seek attention at an urgent or emergency care unit, as these may be signs of an impending stroke.    Emergency Department Pain Levels (6-10/10)  Emergency Room Pain 6 Severely limiting. Requires emergency care and should not be seen or managed at an outpatient pain management facility. Communication becomes difficult and requires great effort. Assistance to reach the emergency department may be required. Facial flushing and profuse sweating along with potentially dangerous increases in heart rate and blood pressure will be evident.   Distressing pain 7 Self-care is very difficult. Assistance is required to transport, or use restroom. Assistance to reach the emergency department will be required. Tasks requiring coordination, such as bathing and getting dressed become very difficult.   Disabling pain 8 Self-care is no longer possible. At this level, pain is disabling. The individual is unable to do even the most "basic" activities such as walking, eating, bathing, dressing, transferring to a bed, or toileting. Fine motor  skills are lost. It is difficult to think clearly.   Incapacitating pain 9 Pain becomes incapacitating. Thought processing is no longer possible. Difficult to remember your own name. Control of movement and coordination are lost.   The worst pain imaginable 10  At this level, most patients pass out from pain. When this level is reached, collapse of the autonomic nervous system occurs, leading to a sudden drop in blood pressure and heart rate. This in turn results in a temporary and dramatic drop in blood flow to the brain, leading to a loss of consciousness. Fainting is one of the body's self defense mechanisms. Passing out puts the brain in a calmed state and causes it to shut down for a while, in order to begin the healing process.    Summary: 1. Refer to this scale when providing [place] with your pain level. 2. Be accurate and careful when reporting your pain level. This will help with your care. 3. Over-reporting your pain level will lead to loss of credibility. 4. Even a level of 1/10 means that there is pain and will be treated at our facility. 5. High, inaccurate reporting will be documented as "Symptom Exaggeration", leading to loss of credibility and suspicions of possible secondary gains such as obtaining more narcotics, or wanting to appear disabled, for fraudulent reasons. 6. Only pain levels of 5 or below will be seen at our facility. 7. Pain levels of 6 and above will be sent to the Emergency Department and the appointment cancelled. ____________________________________________________________________________________________

## 2017-04-15 NOTE — Progress Notes (Signed)
Safety precautions to be maintained throughout the outpatient stay will include: orient to surroundings, keep bed in low position, maintain call bell within reach at all times, provide assistance with transfer out of bed and ambulation.  

## 2017-04-15 NOTE — Patient Instructions (Addendum)
____________________________________________________________________________________________  Post-Procedure instructions Instructions:  Apply ice: Fill a plastic sandwich bag with crushed ice. Cover it with a small towel and apply to injection site. Apply for 15 minutes then remove x 15 minutes. Repeat sequence on day of procedure, until you go to bed. The purpose is to minimize swelling and discomfort after procedure.  Apply heat: Apply heat to procedure site starting the day following the procedure. The purpose is to treat any soreness and discomfort from the procedure.  Food intake: Start with clear liquids (like water) and advance to regular food, as tolerated.   Physical activities: Keep activities to a minimum for the first 8 hours after the procedure.   Driving: If you have received any sedation, you are not allowed to drive for 24 hours after your procedure.  Blood thinner: Restart your blood thinner 6 hours after your procedure. (Only for those taking blood thinners)  Insulin: As soon as you can eat, you may resume your normal dosing schedule. (Only for those taking insulin)  Infection prevention: Keep procedure site clean and dry.  Post-procedure Pain Diary: Extremely important that this be done correctly and accurately. Recorded information will be used to determine the next step in treatment.  Pain evaluated is that of treated area only. Do not include pain from an untreated area.  Complete every hour, on the hour, for the initial 8 hours. Set an alarm to help you do this part accurately.  Do not go to sleep and have it completed later. It will not be accurate.  Follow-up appointment: Keep your follow-up appointment after the procedure. Usually 2 weeks for most procedures. (6 weeks in the case of radiofrequency.) Bring you pain diary.  Expect:  From numbing medicine (AKA: Local Anesthetics): Numbness or decrease in pain.  Onset: Full effect within 15 minutes of  injected.  Duration: It will depend on the type of local anesthetic used. On the average, 1 to 8 hours.   From steroids: Decrease in swelling or inflammation. Once inflammation is improved, relief of the pain will follow.  Onset of benefits: Depends on the amount of swelling present. The more swelling, the longer it will take for the benefits to be seen.   Duration: Steroids will stay in the system x 2 weeks. Duration of benefits will depend on multiple posibilities including persistent irritating factors.  From procedure: Some discomfort is to be expected once the numbing medicine wears off. This should be minimal if ice and heat are applied as instructed. Call if:  You experience numbness and weakness that gets worse with time, as opposed to wearing off.  New onset bowel or bladder incontinence. (Spinal procedures only)  Emergency Numbers:  Durning business hours (Monday - Thursday, 8:00 AM - 4:00 PM) (Friday, 9:00 AM - 12:00 Noon): [phone]  After hours: [phone] ____________________________________________________________________________________________  ____________________________________________________________________________________________  Preparing for your procedure (without sedation) Instructions: . Oral Intake: Do not eat or drink anything for at least 3 hours prior to your procedure. . Transportation: Unless otherwise stated by your physician, you may drive yourself after the procedure. . Blood Pressure Medicine: Take your blood pressure medicine with a sip of water the morning of the procedure. . Blood thinners:  . Diabetics on insulin: Notify the staff so that you can be scheduled 1st case in the morning. If your diabetes requires high dose insulin, take only  of your normal insulin dose the morning of the procedure and notify the staff that you have done so. Marland Kitchen  Preventing infections: Shower with an antibacterial soap the morning of your procedure.   . Build-up your immune system: Take 1000 mg of Vitamin C with every meal (3 times a day) the day prior to your procedure. Marland Kitchen Antibiotics: Inform the staff if you have a condition or reason that requires you to take antibiotics before dental procedures. . Pregnancy: If you are pregnant, call and cancel the procedure. . Sickness: If you have a cold, fever, or any active infections, call and cancel the procedure. . Arrival: You must be in the facility at least 30 minutes prior to your scheduled procedure. . Children: Do not bring any children with you. . Dress appropriately: Bring dark clothing that you would not mind if they get stained. . Valuables: Do not bring any jewelry or valuables. Procedure appointments are reserved for interventional treatments only. Marland Kitchen No Prescription Refills. . No medication changes will be discussed during procedure appointments. . No disability issues will be discussed. ____________________________________________________________________________________________  ____________________________________________________________________________________________  Pain Scale  Introduction: The pain score used by this practice is the Verbal Numerical Rating Scale (VNRS-11). This is an 11-point scale. It is for adults and children 10 years or older. There are significant differences in how the pain score is reported, used, and applied. Forget everything you learned in the past and learn this scoring system.  General Information: The scale should reflect your current level of pain. Unless you are specifically asked for the level of your worst pain, or your average pain. If you are asked for one of these two, then it should be understood that it is over the past 24 hours.  Basic Activities of Daily Living (ADL): Personal hygiene, dressing, eating, transferring, and using restroom.  Instructions: Most patients tend to report their level of pain as a combination of two factors,  their physical pain and their psychosocial pain. This last one is also known as "suffering" and it is reflection of how physical pain affects you socially and psychologically. From now on, report them separately. From this point on, when asked to report your pain level, report only your physical pain. Use the following table for reference.  Pain Clinic Pain Levels (0-5/10)  Pain Level Score  Description  No Pain 0   Mild pain 1 Nagging, annoying, but does not interfere with basic activities of daily living (ADL). Patients are able to eat, bathe, get dressed, toileting (being able to get on and off the toilet and perform personal hygiene functions), transfer (move in and out of bed or a chair without assistance), and maintain continence (able to control bladder and bowel functions). Blood pressure and heart rate are unaffected. A normal heart rate for a healthy adult ranges from 60 to 100 bpm (beats per minute).   Mild to moderate pain 2 Noticeable and distracting. Impossible to hide from other people. More frequent flare-ups. Still possible to adapt and function close to normal. It can be very annoying and may have occasional stronger flare-ups. With discipline, patients may get used to it and adapt.   Moderate pain 3 Interferes significantly with activities of daily living (ADL). It becomes difficult to feed, bathe, get dressed, get on and off the toilet or to perform personal hygiene functions. Difficult to get in and out of bed or a chair without assistance. Very distracting. With effort, it can be ignored when deeply involved in activities.   Moderately severe pain 4 Impossible to ignore for more than a few minutes. With effort, patients may still be  able to manage work or participate in some social activities. Very difficult to concentrate. Signs of autonomic nervous system discharge are evident: dilated pupils (mydriasis); mild sweating (diaphoresis); sleep interference. Heart rate becomes elevated  (>115 bpm). Diastolic blood pressure (lower number) rises above 100 mmHg. Patients find relief in laying down and not moving.   Severe pain 5 Intense and extremely unpleasant. Associated with frowning face and frequent crying. Pain overwhelms the senses.  Ability to do any activity or maintain social relationships becomes significantly limited. Conversation becomes difficult. Pacing back and forth is common, as getting into a comfortable position is nearly impossible. Pain wakes you up from deep sleep. Physical signs will be obvious: pupillary dilation; increased sweating; goosebumps; brisk reflexes; cold, clammy hands and feet; nausea, vomiting or dry heaves; loss of appetite; significant sleep disturbance with inability to fall asleep or to remain asleep. When persistent, significant weight loss is observed due to the complete loss of appetite and sleep deprivation.  Blood pressure and heart rate becomes significantly elevated. Caution: If elevated blood pressure triggers a pounding headache associated with blurred vision, then the patient should immediately seek attention at an urgent or emergency care unit, as these may be signs of an impending stroke.    Emergency Department Pain Levels (6-10/10)  Emergency Room Pain 6 Severely limiting. Requires emergency care and should not be seen or managed at an outpatient pain management facility. Communication becomes difficult and requires great effort. Assistance to reach the emergency department may be required. Facial flushing and profuse sweating along with potentially dangerous increases in heart rate and blood pressure will be evident.   Distressing pain 7 Self-care is very difficult. Assistance is required to transport, or use restroom. Assistance to reach the emergency department will be required. Tasks requiring coordination, such as bathing and getting dressed become very difficult.   Disabling pain 8 Self-care is no longer possible. At this level,  pain is disabling. The individual is unable to do even the most "basic" activities such as walking, eating, bathing, dressing, transferring to a bed, or toileting. Fine motor skills are lost. It is difficult to think clearly.   Incapacitating pain 9 Pain becomes incapacitating. Thought processing is no longer possible. Difficult to remember your own name. Control of movement and coordination are lost.   The worst pain imaginable 10 At this level, most patients pass out from pain. When this level is reached, collapse of the autonomic nervous system occurs, leading to a sudden drop in blood pressure and heart rate. This in turn results in a temporary and dramatic drop in blood flow to the brain, leading to a loss of consciousness. Fainting is one of the body's self defense mechanisms. Passing out puts the brain in a calmed state and causes it to shut down for a while, in order to begin the healing process.    Summary: 1. Refer to this scale when providing us with your pain level. 2. Be accurate and careful when reporting your pain level. This will help with your care. 3. Over-reporting your pain level will lead to loss of credibility. 4. Even a level of 1/10 means that there is pain and will be treated at our facility. 5. High, inaccurate reporting will be documented as "Symptom Exaggeration", leading to loss of credibility and suspicions of possible secondary gains such as obtaining more narcotics, or wanting to appear disabled, for fraudulent reasons. 6. Only pain levels of 5 or below will be seen at our facility. 7. Pain  levels of 6 and above will be sent to the Emergency Department and the appointment cancelled. ____________________________________________________________________________________________

## 2017-04-16 ENCOUNTER — Telehealth: Payer: Self-pay

## 2017-04-16 NOTE — Telephone Encounter (Signed)
Denies any needs at this time "I'm doing good, I have no pain"  Instructed to call if needed.

## 2017-04-29 ENCOUNTER — Encounter: Payer: Self-pay | Admitting: Pain Medicine

## 2017-04-29 ENCOUNTER — Ambulatory Visit (HOSPITAL_BASED_OUTPATIENT_CLINIC_OR_DEPARTMENT_OTHER): Payer: PPO | Admitting: Pain Medicine

## 2017-04-29 ENCOUNTER — Ambulatory Visit
Admission: RE | Admit: 2017-04-29 | Discharge: 2017-04-29 | Disposition: A | Discharge status: Home / Self Care | Payer: PPO | Source: Ambulatory Visit | Attending: Pain Medicine | Admitting: Pain Medicine

## 2017-04-29 VITALS — BP 129/63 | HR 85 | Temp 98.8°F | Resp 16 | Ht 62.0 in | Wt 175.0 lb

## 2017-04-29 DIAGNOSIS — M19012 Primary osteoarthritis, left shoulder: Secondary | ICD-10-CM | POA: Insufficient documentation

## 2017-04-29 DIAGNOSIS — G8929 Other chronic pain: Secondary | ICD-10-CM

## 2017-04-29 DIAGNOSIS — M25512 Pain in left shoulder: Secondary | ICD-10-CM

## 2017-04-29 DIAGNOSIS — Z886 Allergy status to analgesic agent status: Secondary | ICD-10-CM | POA: Diagnosis not present

## 2017-04-29 MED ORDER — ROPIVACAINE HCL 2 MG/ML IJ SOLN
4.0000 mL | Freq: Once | INTRAMUSCULAR | Status: AC
  Administered 2017-04-29: 4 mL

## 2017-04-29 MED ORDER — METHYLPREDNISOLONE ACETATE 80 MG/ML IJ SUSP
INTRAMUSCULAR | Status: AC
  Filled 2017-04-29: qty 1

## 2017-04-29 MED ORDER — ROPIVACAINE HCL 2 MG/ML IJ SOLN
INTRAMUSCULAR | Status: AC
  Filled 2017-04-29: qty 10

## 2017-04-29 MED ORDER — LIDOCAINE HCL (PF) 1.5 % IJ SOLN
20.0000 mL | Freq: Once | INTRAMUSCULAR | Status: AC
  Administered 2017-04-29: 20 mL

## 2017-04-29 MED ORDER — METHYLPREDNISOLONE ACETATE 80 MG/ML IJ SUSP
80.0000 mg | Freq: Once | INTRAMUSCULAR | Status: AC
  Administered 2017-04-29: 80 mg

## 2017-04-29 NOTE — Patient Instructions (Signed)

## 2017-04-29 NOTE — Progress Notes (Signed)
Patient's Name: Cynthia Bennett  MRN: [id number]  Referring Provider: Jerl Mina, MD  DOB: 07/18/1933  PCP: Jerl Mina, MD  DOS: 04/29/2017  Note by: Sydnee Levans. Laban Emperor, MD  Service setting: Ambulatory outpatient  Location: [facility] (AMB) Pain Management Facility  Visit type: Procedure  Specialty: Interventional Pain Management  Patient type: Established   Primary Reason for Visit: Interventional Pain Management Treatment. CC: Shoulder Pain (left)  Procedure:  Anesthesia, Analgesia, Anxiolysis:  Type: Diagnostic Glonohumeral Joint (shoulder) +  Injection Region: Superior Shoulder Area Level: Shoulder Laterality: Left-Sided  Type: Local Anesthesia Local Anesthetic: Lidocaine 1% Route: Infiltration ([place]/IM) IV Access: Declined Sedation: Declined  Indication(s): Analgesia          Indications: 1. Osteoarthritis of shoulder (Left)   2. Chronic shoulder pain (Left)    Pain Score: Pre-procedure: 5 /10 Post-procedure: 0-No pain/10  Pre-op Assessment:  Previous date of service: 04/15/17 Service provided: Procedure (epidural and shoulder) Cynthia Bennett is a 83 y.o. (year old), female patient, seen today for interventional treatment. She  has a past surgical history that includes Esophagogastroduodenoscopy (N/A, 04/16/2015); Appendectomy; and Abdominal hysterectomy. Her primarily concern today is the Shoulder Pain (left)  Post-Procedure Assessment  04/15/2017 Procedure: Diagnostic left L4-5 lumbar epidural steroid injection under fluoroscopic guidance, no sedation Pre-procedure pain score:  8/10 Post-procedure pain score: 0/10 (100% relief) Influential Factors: BMI: 32.01 kg/m Intra-procedural challenges: None observed Assessment challenges: None detected         Post-procedural adverse reactions or complications: None reported Reported side-effects: None  Sedation: No sedation used. When no sedatives are used, the analgesic levels obtained are directly associated to the effectiveness of the  local anesthetics. However, when sedation is provided, the level of analgesia obtained during the initial 1 hour following the intervention, is believed to be the result of a combination of factors. These factors may include, but are not limited to: 1. The effectiveness of the local anesthetics used. 2. The effects of the analgesic(s) and/or anxiolytic(s) used. 3. The degree of discomfort experienced by the patient at the time of the procedure. 4. The patients ability and reliability in recalling and recording the events. 5. The presence and influence of possible secondary gains and/or psychosocial factors. Reported result: Relief experienced during the 1st hour after the procedure: 100 % (Ultra-Short Term Relief) Interpretative annotation: Analgesia during this period is likely to be Local Anesthetic and/or IV Sedative (Analgesic/Anxiolitic) related.          Effects of local anesthetic: The analgesic effects attained during this period are directly associated to the localized infiltration of local anesthetics and therefore cary significant diagnostic value as to the etiological location, or anatomical origin, of the pain. Expected duration of relief is directly dependent on the pharmacodynamics of the local anesthetic used. Long-acting (4-6 hours) anesthetics used.  Reported result: Relief during the next 4 to 6 hour after the procedure: 100 % (Short-Term Relief) Interpretative annotation: Complete relief would suggest area to be the source of the pain.          Long-term benefit: Defined as the period of time past the expected duration of local anesthetics. With the possible exception of prolonged sympathetic blockade from the local anesthetics, benefits during this period are typically attributed to, or associated with, other factors such as analgesic sensory neuropraxia, antiinflammatory effects, or beneficial biochemical changes provided by agents other than the local anesthetics Reported result:  Extended relief following procedure: 90 % (Long-Term Relief) Interpretative annotation: Good relief. This could suggest inflammation to  be a significant component in the etiology to the pain.          Current benefits: Defined as persistent relief that continues at this point in time.   Reported results: Treated area: 90 %       Interpretative annotation: Ongoing benefits would suggest effective therapeutic approach  Interpretation: Results would suggest a successful diagnostic intervention.          Initial Vital Signs: There were no vitals taken for this visit. BMI: 32.01 kg/m  Risk Assessment: Allergies: Reviewed. She is allergic to ibuprofen and nsaids.  Allergy Precautions: None required Coagulopathies: Reviewed. None identified.  Blood-thinner therapy: None at this time Active Infection(s): Reviewed. None identified. Cynthia Bennett is afebrile  Site Confirmation: Cynthia Bennett was asked to confirm the procedure and laterality before marking the site Procedure checklist: Completed Consent: Before the procedure and under the influence of no sedative(s), amnesic(s), or anxiolytics, the patient was informed of the treatment options, risks and possible complications. To fulfill our ethical and legal obligations, as recommended by the American Medical Association's Code of Ethics, I have informed the patient of my clinical impression; the nature and purpose of the treatment or procedure; the risks, benefits, and possible complications of the intervention; the alternatives, including doing nothing; the risk(s) and benefit(s) of the alternative treatment(s) or procedure(s); and the risk(s) and benefit(s) of doing nothing. The patient was provided information about the general risks and possible complications associated with the procedure. These may include, but are not limited to: failure to achieve desired goals, infection, bleeding, organ or nerve damage, allergic reactions, paralysis, and death. In  addition, the patient was informed of those risks and complications associated to the procedure, such as failure to decrease pain; infection; bleeding; organ or nerve damage with subsequent damage to sensory, motor, and/or autonomic systems, resulting in permanent pain, numbness, and/or weakness of one or several areas of the body; allergic reactions; (i.e.: anaphylactic reaction); and/or death. Furthermore, the patient was informed of those risks and complications associated with the medications. These include, but are not limited to: allergic reactions (i.e.: anaphylactic or anaphylactoid reaction(s)); adrenal axis suppression; blood sugar elevation that in diabetics may result in ketoacidosis or comma; water retention that in patients with history of congestive heart failure may result in shortness of breath, pulmonary edema, and decompensation with resultant heart failure; weight gain; swelling or edema; medication-induced neural toxicity; particulate matter embolism and blood vessel occlusion with resultant organ, and/or nervous system infarction; and/or aseptic necrosis of one or more joints. Finally, the patient was informed that Medicine is not an exact science; therefore, there is also the possibility of unforeseen or unpredictable risks and/or possible complications that may result in a catastrophic outcome. The patient indicated having understood very clearly. We have given the patient no guarantees and we have made no promises. Enough time was given to the patient to ask questions, all of which were answered to the patient's satisfaction. Ms. Weppler has indicated that she wanted to continue with the procedure. Attestation: I, the ordering provider, attest that I have discussed with the patient the benefits, risks, side-effects, alternatives, likelihood of achieving goals, and potential problems during recovery for the procedure that I have provided informed consent. Date: 04/29/2017; Time: 7:26  AM  Pre-Procedure Preparation:  Monitoring: As per clinic protocol. Respiration, ETCO2, SpO2, BP, heart rate and rhythm monitor placed and checked for adequate function Safety Precautions: Patient was assessed for positional comfort and pressure points before starting the procedure. Time-out:  I initiated and conducted the "Time-out" before starting the procedure, as per protocol. The patient was asked to participate by confirming the accuracy of the "Time Out" information. Verification of the correct person, site, and procedure were performed and confirmed by me, the nursing staff, and the patient. "Time-out" conducted as per Joint Commission's Universal Protocol (UP.01.01.01). "Time-out" Date & Time: 04/29/2017; 0927 hrs.  Description of Procedure Process:   Position: Supine Target Area: Acromio-clavicular Joint Approach: Entire approach. Area Prepped: Entire shoulder Area Prepping solution: ChloraPrep (2% chlorhexidine gluconate and 70% isopropyl alcohol) Safety Precautions: Aspiration looking for blood return was conducted prior to all injections. At no point did we inject any substances, as a needle was being advanced. No attempts were made at seeking any paresthesias. Safe injection practices and needle disposal techniques used. Medications properly checked for expiration dates. SDV (single dose vial) medications used. Description of the Procedure: Protocol guidelines were followed. The patient was placed in position over the procedure table. The target area was identified and the area prepped in the usual manner. Skin & deeper tissues infiltrated with local anesthetic. Appropriate amount of time allowed to pass for local anesthetics to take effect. The procedure needles were then advanced to the target area. Proper needle placement secured. Negative aspiration confirmed. Solution injected in intermittent fashion, asking for systemic symptoms every 0.5cc of injectate. The needles were then removed  and the area cleansed, making sure to leave some of the prepping solution back to take advantage of its long term bactericidal properties. Vitals:   04/29/17 0900 04/29/17 0925 04/29/17 0930 04/29/17 0935  BP: 124/71 124/70 131/64 129/63  Pulse: 89 84 81 85  Resp: 16 20 16 16   Temp:      SpO2: 98% 97% 99% 99%  Weight:      Height:        Start Time: 0927 hrs. End Time: 0935 hrs. Materials:  Needle(s) Type: Regular needle Gauge: 22G Length: 3.5-in Medication(s): We administered methylPREDNISolone acetate, lidocaine, and ropivacaine (PF) 2 mg/mL (0.2%). Please see chart orders for dosing details.  Imaging Guidance (Non-Spinal):  Type of Imaging Technique: Fluoroscopy Guidance (Non-Spinal) Indication(s): Assistance in needle guidance and placement for procedures requiring needle placement in or near specific anatomical locations not easily accessible without such assistance. Exposure Time: Please see nurses notes. Contrast: None used. Fluoroscopic Guidance: I was personally present during the use of fluoroscopy. "Tunnel Vision Technique" used to obtain the best possible view of the target area. Parallax error corrected before commencing the procedure. "Direction-depth-direction" technique used to introduce the needle under continuous pulsed fluoroscopy. Once target was reached, antero-posterior, oblique, and lateral fluoroscopic projection used confirm needle placement in all planes. Images permanently stored in EMR. Interpretation: No contrast injected. I personally interpreted the imaging intraoperatively. Adequate needle placement confirmed in multiple planes. Permanent images saved into the patient's record.  Antibiotic Prophylaxis:  Indication(s): None identified Antibiotic given: None  Post-operative Assessment:  EBL: None Complications: No immediate post-treatment complications observed by team, or reported by patient. Note: The patient tolerated the entire procedure well. A  repeat set of vitals were taken after the procedure and the patient was kept under observation following institutional policy, for this type of procedure. Post-procedural neurological assessment was performed, showing return to baseline, prior to discharge. The patient was provided with post-procedure discharge instructions, including a section on how to identify potential problems. Should any problems arise concerning this procedure, the patient was given instructions to immediately contact us, at any time, without hesitation.  In any case, we plan to contact the patient by telephone for a follow-up status report regarding this interventional procedure. Comments:  No additional relevant information.  Plan of Care  Disposition: Discharge home  Discharge Date & Time: 04/29/2017; 0940 hrs.  Physician-requested Follow-up:  Return for post-procedure eval (in 2 wks).  Future Appointments Date Time Provider Department Center  05/28/2017 2:30 PM Delano MetzNaveira, Sage Hammill, MD [facility] None  07/01/2017 9:30 AM Barbette MerinoKing, Crystal M, NP [facility] None   Medications ordered for procedure: Meds ordered this encounter  Medications  . methylPREDNISolone acetate (DEPO-MEDROL) injection 80 mg  . lidocaine 1.5 % injection 20 mL    From block tray.  . ropivacaine (PF) 2 mg/mL (0.2%) (NAROPIN) injection 4 mL   Medications administered: We administered methylPREDNISolone acetate, lidocaine, and ropivacaine (PF) 2 mg/mL (0.2%).  See the medical record for exact dosing, route, and time of administration.  Lab-work, Procedure(s), & Referral(s) Ordered: Orders Placed This Encounter  Procedures  . SHOULDER INJECTION  . DG C-Arm 1-60 Min-No Report  . Informed Consent Details: Transcribe to consent form and obtain patient signature  . Provider attestation of informed consent for procedure/surgical case  . Verify informed consent  . Discharge instructions  . Follow-up   Imaging Ordered: Results for orders placed in visit  on 04/15/17  DG C-Arm 1-60 Min-No Report   Narrative Fluoroscopy was utilized by the requesting physician.  No radiographic  interpretation.    New Prescriptions   No medications on file   Primary Care Physician: Jerl MinaHedrick, James, MD [facility] Note by: Sydnee LevansFrancisco A. Laban EmperorNaveira, M.D, DABA, DABAPM, DABPM, DABIPP, FIPP Date: 04/29/2017; Time: 9:59 AM  Disclaimer:  Medicine is not an Visual merchandiserexact science. The only guarantee in medicine is that nothing is guaranteed. It is important to note that the decision to proceed with this intervention was based on the information collected from the patient. The Data and conclusions were drawn from the patient's questionnaire, the interview, and the physical examination. Because the information was provided in large part by the patient, it cannot be guaranteed that it has not been purposely or unconsciously manipulated. Every effort has been made to obtain as much relevant data as possible for this evaluation. It is important to note that the conclusions that lead to this procedure are derived in large part from the available data. Always take into account that the treatment will also be dependent on availability of resources and existing treatment guidelines, considered by other Pain Management Practitioners as being common knowledge and practice, at the time of the intervention. For Medico-Legal purposes, it is also important to point out that variation in procedural techniques and pharmacological choices are the acceptable norm. The indications, contraindications, technique, and results of the above procedure should only be interpreted and judged by a Board-Certified Interventional Pain Specialist with extensive familiarity and expertise in the same exact procedure and technique.  Instructions provided at this appointment: Patient Instructions   ____________________________________________________________________________________________  Post-Procedure instructions Instructions:  Apply ice: Fill a plastic sandwich bag with crushed ice. Cover it with a small towel and apply to injection site. Apply for 15 minutes then remove x 15 minutes. Repeat sequence on day of procedure, until you go to bed. The purpose is to minimize swelling and discomfort after procedure.  Apply heat: Apply heat to procedure site starting the day following the procedure. The purpose is to treat any soreness and discomfort from the procedure.  Food intake: Start with clear liquids (like water)  and advance to regular food, as tolerated.   Physical activities: Keep activities to a minimum for the first 8 hours after the procedure.   Driving: If you have received any sedation, you are not allowed to drive for 24 hours after your procedure.  Blood thinner: Restart your blood thinner 6 hours after your procedure. (Only for those taking blood thinners)  Insulin: As soon as you can eat, you may resume your normal dosing schedule. (Only for those taking insulin)  Infection prevention: Keep procedure site clean and dry.  Post-procedure Pain Diary: Extremely important that this be done correctly and accurately. Recorded information will be used to determine the next step in treatment.  Pain evaluated is that of treated area only. Do not include pain from an untreated area.  Complete every hour, on the hour, for the initial 8 hours. Set an alarm to help you do this part accurately.  Do not go to sleep and have it completed later. It will not be accurate.  Follow-up appointment: Keep your follow-up appointment after the procedure. Usually 2 weeks for most procedures. (6 weeks in the case of radiofrequency.) Bring you pain diary.  Expect:  From numbing medicine (AKA: Local Anesthetics): Numbness or decrease in pain.  Onset: Full effect within 15 minutes of  injected.  Duration: It will depend on the type of local anesthetic used. On the average, 1 to 8 hours.   From steroids: Decrease in swelling or inflammation. Once inflammation is improved, relief of the pain will follow.  Onset of benefits: Depends on the amount of swelling present. The more swelling, the longer it will take for the benefits to be seen. In some cases, up to 10 days.  Duration: Steroids will stay in the system x 2 weeks. Duration of benefits will depend on multiple posibilities including persistent irritating factors.  From procedure: Some discomfort is to be expected once the numbing medicine wears off. This should be minimal if ice and heat are applied as instructed. Call if:  You experience numbness and weakness that gets worse with time, as opposed to wearing off.  New onset bowel or bladder incontinence. (Spinal procedures only)  Emergency Numbers:  Durning business hours (Monday - Thursday, 8:00 AM - 4:00 PM) (Friday, 9:00 AM - 12:00 Noon): [phone]  After hours: [phone] ____________________________________________________________________________________________

## 2017-04-29 NOTE — Progress Notes (Signed)
Safety precautions to be maintained throughout the outpatient stay will include: orient to surroundings, keep bed in low position, maintain call bell within reach at all times, provide assistance with transfer out of bed and ambulation.  

## 2017-04-30 ENCOUNTER — Telehealth: Payer: Self-pay

## 2017-04-30 NOTE — Telephone Encounter (Signed)
Talked with pt and states she felt nausea but thinks it was from some stomach meds she took. Not running a temperature. Instructed to call if needed or need to go to the ER.

## 2017-05-28 ENCOUNTER — Encounter: Payer: Self-pay | Admitting: Pain Medicine

## 2017-05-28 ENCOUNTER — Ambulatory Visit: Payer: PPO | Attending: Pain Medicine | Admitting: Pain Medicine

## 2017-05-28 VITALS — BP 80/62 | HR 81 | Temp 98.1°F | Resp 16 | Ht 62.0 in | Wt 175.0 lb

## 2017-05-28 DIAGNOSIS — E785 Hyperlipidemia, unspecified: Secondary | ICD-10-CM | POA: Diagnosis not present

## 2017-05-28 DIAGNOSIS — M545 Low back pain: Secondary | ICD-10-CM | POA: Diagnosis not present

## 2017-05-28 DIAGNOSIS — M25512 Pain in left shoulder: Secondary | ICD-10-CM | POA: Diagnosis not present

## 2017-05-28 DIAGNOSIS — M1990 Unspecified osteoarthritis, unspecified site: Secondary | ICD-10-CM | POA: Diagnosis not present

## 2017-05-28 DIAGNOSIS — Z9889 Other specified postprocedural states: Secondary | ICD-10-CM | POA: Insufficient documentation

## 2017-05-28 DIAGNOSIS — Z8673 Personal history of transient ischemic attack (TIA), and cerebral infarction without residual deficits: Secondary | ICD-10-CM | POA: Insufficient documentation

## 2017-05-28 DIAGNOSIS — R937 Abnormal findings on diagnostic imaging of other parts of musculoskeletal system: Secondary | ICD-10-CM | POA: Diagnosis not present

## 2017-05-28 DIAGNOSIS — M5416 Radiculopathy, lumbar region: Secondary | ICD-10-CM | POA: Diagnosis not present

## 2017-05-28 DIAGNOSIS — Z90710 Acquired absence of both cervix and uterus: Secondary | ICD-10-CM | POA: Insufficient documentation

## 2017-05-28 DIAGNOSIS — M797 Fibromyalgia: Secondary | ICD-10-CM | POA: Insufficient documentation

## 2017-05-28 DIAGNOSIS — K219 Gastro-esophageal reflux disease without esophagitis: Secondary | ICD-10-CM | POA: Diagnosis not present

## 2017-05-28 DIAGNOSIS — M25551 Pain in right hip: Secondary | ICD-10-CM | POA: Diagnosis not present

## 2017-05-28 DIAGNOSIS — Z8711 Personal history of peptic ulcer disease: Secondary | ICD-10-CM | POA: Diagnosis not present

## 2017-05-28 DIAGNOSIS — Z886 Allergy status to analgesic agent status: Secondary | ICD-10-CM | POA: Insufficient documentation

## 2017-05-28 DIAGNOSIS — Z87891 Personal history of nicotine dependence: Secondary | ICD-10-CM | POA: Insufficient documentation

## 2017-05-28 DIAGNOSIS — G8929 Other chronic pain: Secondary | ICD-10-CM | POA: Diagnosis not present

## 2017-05-28 DIAGNOSIS — H409 Unspecified glaucoma: Secondary | ICD-10-CM | POA: Insufficient documentation

## 2017-05-28 DIAGNOSIS — M533 Sacrococcygeal disorders, not elsewhere classified: Secondary | ICD-10-CM | POA: Diagnosis not present

## 2017-05-28 DIAGNOSIS — Z836 Family history of other diseases of the respiratory system: Secondary | ICD-10-CM | POA: Diagnosis not present

## 2017-05-28 DIAGNOSIS — N183 Chronic kidney disease, stage 3 (moderate): Secondary | ICD-10-CM | POA: Insufficient documentation

## 2017-05-28 DIAGNOSIS — Z6832 Body mass index (BMI) 32.0-32.9, adult: Secondary | ICD-10-CM | POA: Insufficient documentation

## 2017-05-28 DIAGNOSIS — Z818 Family history of other mental and behavioral disorders: Secondary | ICD-10-CM | POA: Diagnosis not present

## 2017-05-28 NOTE — Progress Notes (Signed)
Patient's Name: Cynthia Bennett  MRN: [id number]  Referring Provider: [name], MD  DOB: Oct 08, 1933  PCP: [name], MD  DOS: 05/28/2017  Note by: Gaspar Cola, MD  Service setting: Ambulatory outpatient  Specialty: Interventional Pain Management  Location: [facility] (AMB) Pain Management Facility    Patient type: Established   Primary Reason(s) for Visit: Encounter for post-procedure evaluation of chronic illness with mild to moderate exacerbation CC: Back Pain (low right)  HPI  Cynthia Bennett is a 83 y.o. year old, female patient, who comes today for a post-procedure evaluation. She has Fibromyalgia; Adiposity; Current tobacco use; Bilateral low back pain with left-sided sciatica; Chronic pain syndrome; Lumbar radicular pain (Bilateral) (R>L); Encounter for therapeutic drug level monitoring; Long term current use of opiate analgesic; Long term prescription opiate use; Uncomplicated opioid dependence ([place]); Opiate use; Acid reflux; HLD (hyperlipidemia); Cannot sleep; Primary open angle glaucoma; Post menopausal syndrome; Temporary cerebral vascular dysfunction; Chronic low back pain (Location of Primary Source of Pain) (Bilateral) (L>R); Grade 1 spondylolisthesis of L4 over L5; Severe (5-6 mm) L4-5 lumbar spinal stenosis and (8 mm) L3-4 spinal stenosis.; Lumbar discogenic pain syndrome; Lumbar facet syndrome (Location of Primary Source of Pain) (Bilateral) (L>R); Lumbar facet hypertrophy (Bilateral); Chronic sacroiliac joint pain (Left); Chronic hip pain (Left); Peptic ulcer disease; Morbid obesity ([place]); GERD (gastroesophageal reflux disease); Encounter for chronic pain management; Opiate analgesic contract exists ([facility]); Lumbar spondylosis; Abnormal MRI, lumbar spine (06/25/2016); Cervical spondylosis (Anterolisthesis of C5 over C6); Difficulty voiding; Neurogenic bladder; Chronic lower extremity pain (Location of Secondary source of pain) (Left); Chronic lumbar  radicular pain (Location of Secondary source of pain) (Left) (L5 Dermatome); Osteoarthritis of hip (Left); Chronic hip pain (Bilateral) (R>L); Chronic sacroiliac joint pain (Bilateral) (R>L); Kidney disease, chronic, stage III (GFR 30-59 ml/min); Chronic shoulder pain (Left); Osteoarthritis of shoulder (Left); Chronic sacroiliac joint pain (Right); and Chronic hip pain (Right) on her problem list. Her primarily concern today is the Back Pain (low right)  Pain Assessment: Location: Right Back Radiating: denies Onset: More than a month ago Duration: Chronic pain Quality: Aching, Constant Severity: 4 /10 (self-reported pain score)  Note: Reported level is compatible with observation.                   Effect on ADL:   Timing: Constant Modifying factors: sitting  Cynthia Bennett comes in today for post-procedure evaluation after the treatment done on 04/29/2017. The patient indicates that her shoulder is doing much better, but she is now eating to experience some right lower back pain. Physical exam today demonstrated pain arising from the SI joint and the hip joint. The biggest component seems to be from the SI joint.  Further details on both, my assessment(s), as well as the proposed treatment plan, please see below.  Post-Procedure Assessment  04/29/2017 Procedure: Diagnostic left intra-articular shoulder joint injection under fluoroscopic guidance, no sedation Pre-procedure pain score:  5/10 Post-procedure pain score: 0/10 (100% relief) Influential Factors: BMI: 32.01 kg/m Intra-procedural challenges: None observed.         Assessment challenges: None detected.              Reported side-effects: None.        Post-procedural adverse reactions or complications: None reported         Sedation: No sedation used. When no sedatives are used, the analgesic levels obtained are directly associated to the effectiveness of the local anesthetics. However, when sedation is provided, the  level of analgesia  obtained during the initial 1 hour following the intervention, is believed to be the result of a combination of factors. These factors may include, but are not limited to: 1. The effectiveness of the local anesthetics used. 2. The effects of the analgesic(s) and/or anxiolytic(s) used. 3. The degree of discomfort experienced by the patient at the time of the procedure. 4. The patients ability and reliability in recalling and recording the events. 5. The presence and influence of possible secondary gains and/or psychosocial factors. Reported result: Relief experienced during the 1st hour after the procedure: 100 % (Ultra-Short Term Relief) Interpretative annotation: Clinically appropriate result. Analgesia during this period is likely to be Local Anesthetic and/or IV Sedative (Analgesic/Anxiolytic) related.          Effects of local anesthetic: The analgesic effects attained during this period are directly associated to the localized infiltration of local anesthetics and therefore cary significant diagnostic value as to the etiological location, or anatomical origin, of the pain. Expected duration of relief is directly dependent on the pharmacodynamics of the local anesthetic used. Long-acting (4-6 hours) anesthetics used.  Reported result: Relief during the next 4 to 6 hour after the procedure: 100 % (Short-Term Relief) Interpretative annotation: Clinically appropriate result. Analgesia during this period is likely to be Local Anesthetic-related.          Long-term benefit: Defined as the period of time past the expected duration of local anesthetics. With the possible exception of prolonged sympathetic blockade from the local anesthetics, benefits during this period are typically attributed to, or associated with, other factors such as analgesic sensory neuropraxia, antiinflammatory effects, or beneficial biochemical changes provided by agents other than the local anesthetics Reported result: Extended  relief following procedure: 98 % (ongoing) (Long-Term Relief) Interpretative annotation: Clinically appropriate result. Good relief. No permanent benefit expected. Inflammation plays a part in the etiology to the pain.          Current benefits: Defined as persistent relief that continues at this point in time.   Reported results: Treated area: 98 % Cynthia Bennett reports improvement in function Interpretative annotation: Recurrence of symptoms. No permanent benefit expected. Effective therapeutic approach. Benefit believed to be steroid-related.  Interpretation: Results would suggest a successful diagnostic intervention.          Laboratory Chemistry  Inflammation Markers (CRP: Acute Phase) (ESR: Chronic Phase) Lab Results  Component Value Date   CRP 0.6 06/24/2016   ESRSEDRATE 23 06/24/2016                 Renal Function Markers Lab Results  Component Value Date   BUN 13 06/24/2016   CREATININE 0.95 06/24/2016   GFRAA >60 06/24/2016   GFRNONAA 55 (L) 06/24/2016                 Hepatic Function Markers Lab Results  Component Value Date   AST 17 06/24/2016   ALT 11 (L) 06/24/2016   ALBUMIN 3.9 06/24/2016   ALKPHOS 70 06/24/2016                 Electrolytes Lab Results  Component Value Date   NA 137 06/24/2016   K 4.3 06/24/2016   CL 107 06/24/2016   CALCIUM 9.1 06/24/2016   MG 2.2 06/24/2016                 Neuropathy Markers Lab Results  Component Value Date   VITAMINB12 240 06/24/2016  Bone Pathology Markers Lab Results  Component Value Date   ALKPHOS 70 06/24/2016   25OHVITD1 26 (L) 06/24/2016   25OHVITD2 5.6 06/24/2016   25OHVITD3 20 06/24/2016   CALCIUM 9.1 06/24/2016                 Coagulation Parameters Lab Results  Component Value Date   INR 0.96 06/24/2016   LABPROT 12.8 06/24/2016   PLT 291 06/24/2016                 Cardiovascular Markers Lab Results  Component Value Date   HGB 12.7 06/24/2016   HCT 38.6 06/24/2016                  Note: Lab results reviewed.  Recent Diagnostic Imaging Review  Dg C-arm 1-60 Min-no Report  Result Date: 04/29/2017 Fluoroscopy was utilized by the requesting physician.  No radiographic interpretation.   Note: Imaging results reviewed.          Meds   Current Meds  Medication Sig  . buPROPion (WELLBUTRIN SR) 150 MG 12 hr tablet TAKE 1 TABLET BY MOUTH ONCE DAILY  . dorzolamide-timolol (COSOPT) 22.3-6.8 MG/ML ophthalmic solution INT 1 GTT IN OU BID  . doxepin (SINEQUAN) 100 MG capsule Take 100 mg by mouth at bedtime.  Marland Kitchen oxyCODONE (OXY IR/ROXICODONE) 5 MG immediate release tablet Take 1 tablet (5 mg total) by mouth every 12 (twelve) hours as needed for severe pain.  Derrill Memo ON 06/14/2017] oxyCODONE (OXY IR/ROXICODONE) 5 MG immediate release tablet Take 1 tablet (5 mg total) by mouth every 12 (twelve) hours as needed for severe pain.  . pantoprazole (PROTONIX) 40 MG tablet Take 1 tablet by mouth 2 (two) times daily.  . sertraline (ZOLOFT) 100 MG tablet Take 1 tablet by mouth every morning.  . zolpidem (AMBIEN) 10 MG tablet TAKE 1/2 TO 1 TABLET BY MOUTH EVERY NIGHT AT BEDTIME    ROS  Constitutional: Denies any fever or chills Gastrointestinal: No reported hemesis, hematochezia, vomiting, or acute GI distress Musculoskeletal: Denies any acute onset joint swelling, redness, loss of ROM, or weakness Neurological: No reported episodes of acute onset apraxia, aphasia, dysarthria, agnosia, amnesia, paralysis, loss of coordination, or loss of consciousness  Allergies  Cynthia Bennett is allergic to ibuprofen and nsaids.  PFSH  Drug: Cynthia Bennett  reports that she does not use drugs. Alcohol:  reports that she does not drink alcohol. Tobacco:  reports that she has quit smoking. She has never used smokeless tobacco. Medical:  has a past medical history of Fibromyalgia; Glaucoma; Hiatal hernia; Osteoarthritis; Osteoarthritis of spine with radiculopathy, lumbar region (08/14/2015); Peptic ulcer  disease; Reflux; Spinal stenosis; Stroke [facility]); and TIA (transient ischemic attack). Surgical: Cynthia Bennett  has a past surgical history that includes Esophagogastroduodenoscopy (N/A, 04/16/2015); Appendectomy; and Abdominal hysterectomy. Family: family history includes COPD in her father; Mental illness in her mother.  Constitutional Exam  General appearance: Well nourished, well developed, and well hydrated. In no apparent acute distress Vitals:   05/28/17 1443 05/28/17 1446  BP:  (!) 80/62  Pulse: 81   Resp: 16   Temp: 98.1 F (36.7 C)   SpO2: 98%   Weight: 175 lb (79.4 kg)   Height: _0  (1.575 m)    BMI Assessment: Estimated body mass index is 32.01 kg/m as calculated from the following:   Height as of this encounter: _1  (1.575 m).   Weight as of this encounter: 175 lb (79.4 kg).  BMI interpretation  table: BMI level Category Range association with higher incidence of chronic pain  <18 kg/m2 Underweight   18.5-24.9 kg/m2 Ideal body weight   25-29.9 kg/m2 Overweight Increased incidence by 20%  30-34.9 kg/m2 Obese (Class I) Increased incidence by 68%  35-39.9 kg/m2 Severe obesity (Class II) Increased incidence by 136%  >40 kg/m2 Extreme obesity (Class III) Increased incidence by 254%   BMI Readings from Last 4 Encounters:  05/28/17 32.01 kg/m  04/29/17 32.01 kg/m  04/15/17 32.01 kg/m  03/31/17 32.01 kg/m   Wt Readings from Last 4 Encounters:  05/28/17 175 lb (79.4 kg)  04/29/17 175 lb (79.4 kg)  04/15/17 175 lb (79.4 kg)  03/31/17 175 lb (79.4 kg)  Psych/Mental status: Alert, oriented x 3 (person, place, & time)       Eyes: PERLA Respiratory: No evidence of acute respiratory distress  Cervical Spine Exam  Inspection: No masses, redness, or swelling Alignment: Symmetrical Functional ROM: Unrestricted ROM      Stability: No instability detected Muscle strength & Tone: Functionally intact Sensory: Unimpaired Palpation: No palpable anomalies              Upper  Extremity (UE) Exam    Side: Right upper extremity  Side: Left upper extremity  Inspection: No masses, redness, swelling, or asymmetry. No contractures  Inspection: No masses, redness, swelling, or asymmetry. No contractures  Functional ROM: Unrestricted ROM          Functional ROM: Improved after treatment for shoulder  Muscle strength & Tone: Functionally intact  Muscle strength & Tone: Functionally intact  Sensory: Unimpaired  Sensory: Improved  Palpation: No palpable anomalies              Palpation: No palpable anomalies              Specialized Test(s): Deferred         Specialized Test(s): Deferred          Thoracic Spine Exam  Inspection: No masses, redness, or swelling Alignment: Symmetrical Functional ROM: Unrestricted ROM Stability: No instability detected Sensory: Unimpaired Muscle strength & Tone: No palpable anomalies  Lumbar Spine Exam  Inspection: No masses, redness, or swelling Alignment: Symmetrical Functional ROM: Unrestricted ROM      Stability: No instability detected Muscle strength & Tone: Functionally intact Sensory: Unimpaired Palpation: No palpable anomalies       Provocative Tests: Lumbar Hyperextension and rotation test: evaluation deferred today       Lumbar Lateral bending test: evaluation deferred today       Patrick's Maneuver: evaluation deferred today                    Gait & Posture Assessment  Ambulation: Unassisted Gait: Relatively normal for age and body habitus Posture: WNL   Lower Extremity Exam    Side: Right lower extremity  Side: Left lower extremity  Inspection: No masses, redness, swelling, or asymmetry. No contractures  Inspection: No masses, redness, swelling, or asymmetry. No contractures  Functional ROM: Unrestricted ROM          Functional ROM: Unrestricted ROM          Muscle strength & Tone: Functionally intact  Muscle strength & Tone: Functionally intact  Sensory: Unimpaired  Sensory: Unimpaired  Palpation: No palpable  anomalies  Palpation: No palpable anomalies   Assessment  Primary Diagnosis & Pertinent Problem List: The primary encounter diagnosis was Chronic sacroiliac joint pain (Right). Diagnoses of Chronic shoulder pain (Left), Chronic low back pain (  Location of Primary Source of Pain) (Bilateral) (L>R), Chronic hip pain (Right), Chronic lumbar radicular pain (Location of Secondary source of pain) (Left) (L5 Dermatome), and Abnormal MRI, lumbar spine (06/25/2016) were also pertinent to this visit.  Status Diagnosis  Worsening Improved Worsening 1. Chronic sacroiliac joint pain (Right)   2. Chronic shoulder pain (Left)   3. Chronic low back pain (Location of Primary Source of Pain) (Bilateral) (L>R)   4. Chronic hip pain (Right)   5. Chronic lumbar radicular pain (Location of Secondary source of pain) (Left) (L5 Dermatome)   6. Abnormal MRI, lumbar spine (06/25/2016)     Problems updated and reviewed during this visit: Problem  Chronic sacroiliac joint pain (Right)  Chronic hip pain (Right)  Abnormal MRI, lumbar spine (06/25/2016)   FINDINGS: Alignment: Levoscoliosis of the lumbar spine. 3 mm of anterolisthesis of L4 on L5. Disc levels: Disc spaces: Degenerative disc disease with disc height loss at L2-3, L3-4 and L4-5. T12-L1: Mild broad-based disc bulge. No evidence of neural foraminal stenosis. No central canal stenosis. L1-L2: Mild broad-based disc bulge. Mild bilateral facet arthropathy. Mild right lateral recess stenosis. No evidence of neural foraminal stenosis. No central canal stenosis. L2-L3: Mild broad-based disc bulge. Moderate right and mild left facet arthropathy. No evidence of neural foraminal stenosis. Mild spinal stenosis. L3-L4: Mild broad-based disc bulge. Moderate bilateral facet arthropathy. Mild spinal stenosis and right lateral recess stenosis. No evidence of neural foraminal stenosis. L4-L5: Moderate broad-based disc bulge. Severe bilateral facet arthropathy with  ligamentum flavum infolding resulting in severe left lateral recess stenosis and moderate-severe spinal stenosis. No evidence of neural foraminal stenosis. L5-S1: No significant disc bulge. Mild bilateral facet arthropathy. No evidence of neural foraminal stenosis. No central canal stenosis. IMPRESSION: 1. At L4-5 there is a moderate broad-based disc bulge. Severe bilateral facet arthropathy with ligamentum flavum infolding resulting in severe left lateral recess stenosis and moderate-severe spinal stenosis. 2. At L3-4 there is a mild broad-based disc bulge. Moderate bilateral facet arthropathy. Mild spinal stenosis and right lateral recess stenosis. 3. At L2-3 there is a mild broad-based disc bulge. Mild bilateral facet arthropathy. Mild right lateral recess stenosis.    Plan of Care  Pharmacotherapy (Medications Ordered): No orders of the defined types were placed in this encounter.  New Prescriptions   No medications on file   Medications administered today: Cynthia Bennett had no medications administered during this visit.  Lab-work, procedure(s), and/or referral(s): Orders Placed This Encounter  Procedures  . SHOULDER INJECTION  . SACROILIAC JOINT INJECTINS  . Lumbar Epidural Injection    Interventional management options: Planned, scheduled, and/or pending:   Not at this time.   Considering:   Diagnostic bilateral lumbar facet block  Possible bilateral lumbar facet RFA  Diagnostic bilateral sacroiliac joint block  Possible bilateral sacroiliac joint RFA  Diagnostic left L4-5 transforaminal epidural steroid injection Palliative left L4-5 interlaminar epidural steroid injection Diagnostic bilateral intra-articular hip joint injection  Diagnostic bilateral femoral nerve + obturator nerve block  Possible bilateral femoral nerve + obturator nerve RFA    Palliative PRN treatment(s):   Palliative left-sided L4-5 Interlaminar lumbar epidural steroid injection  Palliative L5-S1  interlaminar lumbar epidural steroid injection  Palliative left intra-articular shoulder joint injection  Palliative right sacroiliac joint block    Provider-requested follow-up: Return if symptoms worsen or fail to improve, for PRN procedure(s), by MD.  Future Appointments Date Time Provider [place]  07/01/2017 9:30 AM Vevelyn Francois, NP [facility] None   Primary Care Physician:  [name], MD Location: [facility] Note by: Gaspar Cola, MD Date: 05/28/2017; Time: 3:32 PM  There are no Patient Instructions on file for this visit.

## 2017-05-28 NOTE — Progress Notes (Signed)
Safety precautions to be maintained throughout the outpatient stay will include: orient to surroundings, keep bed in low position, maintain call bell within reach at all times, provide assistance with transfer out of bed and ambulation.  

## 2017-06-29 ENCOUNTER — Telehealth: Payer: Self-pay

## 2017-06-29 NOTE — Telephone Encounter (Signed)
Patient is having a lot of pain in her shoulder and her lower back. Can she get a shoulder injection and a lesi at the same time? I have to get insurance authorization so I will wait for a response. Thank you

## 2017-06-30 DIAGNOSIS — H401133 Primary open-angle glaucoma, bilateral, severe stage: Secondary | ICD-10-CM | POA: Diagnosis not present

## 2017-07-01 ENCOUNTER — Encounter: Payer: PPO | Admitting: Nurse Practitioner

## 2017-07-01 NOTE — Telephone Encounter (Signed)
I really need to find out if I can get approval for both injections for one visit. Please let me know. Thanks

## 2017-07-01 NOTE — Telephone Encounter (Signed)
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 I went ahead and sent the request to her insurance company for both injections.

## 2017-07-01 NOTE — Telephone Encounter (Signed)
Dr. Pernell Dupre should be fine to do both if insurance approves. Please try to get prior auth and let nursing know. Thank you.

## 2017-07-21 ENCOUNTER — Encounter: Payer: Self-pay | Admitting: Pain Medicine

## 2017-07-21 ENCOUNTER — Ambulatory Visit
Admission: RE | Admit: 2017-07-21 | Discharge: 2017-07-21 | Disposition: A | Discharge status: Home / Self Care | Payer: PPO | Source: Ambulatory Visit | Attending: Pain Medicine | Admitting: Pain Medicine

## 2017-07-21 ENCOUNTER — Ambulatory Visit (HOSPITAL_BASED_OUTPATIENT_CLINIC_OR_DEPARTMENT_OTHER): Payer: PPO | Admitting: Pain Medicine

## 2017-07-21 VITALS — BP 125/61 | HR 88 | Temp 98.2°F | Resp 16 | Ht 63.0 in | Wt 170.0 lb

## 2017-07-21 DIAGNOSIS — M79605 Pain in left leg: Secondary | ICD-10-CM | POA: Diagnosis not present

## 2017-07-21 DIAGNOSIS — M79601 Pain in right arm: Secondary | ICD-10-CM | POA: Diagnosis not present

## 2017-07-21 DIAGNOSIS — M25512 Pain in left shoulder: Secondary | ICD-10-CM | POA: Diagnosis not present

## 2017-07-21 DIAGNOSIS — M791 Myalgia: Secondary | ICD-10-CM | POA: Diagnosis not present

## 2017-07-21 DIAGNOSIS — G8929 Other chronic pain: Secondary | ICD-10-CM | POA: Insufficient documentation

## 2017-07-21 DIAGNOSIS — M79622 Pain in left upper arm: Secondary | ICD-10-CM | POA: Insufficient documentation

## 2017-07-21 DIAGNOSIS — Z886 Allergy status to analgesic agent status: Secondary | ICD-10-CM | POA: Diagnosis not present

## 2017-07-21 DIAGNOSIS — M5416 Radiculopathy, lumbar region: Secondary | ICD-10-CM

## 2017-07-21 DIAGNOSIS — M7918 Myalgia, other site: Secondary | ICD-10-CM | POA: Insufficient documentation

## 2017-07-21 MED ORDER — ROPIVACAINE HCL 2 MG/ML IJ SOLN
9.0000 mL | Freq: Once | INTRAMUSCULAR | Status: AC
  Administered 2017-07-21: 2 mL
  Filled 2017-07-21: qty 10

## 2017-07-21 MED ORDER — LIDOCAINE HCL 2 % IJ SOLN
10.0000 mL | Freq: Once | INTRAMUSCULAR | Status: AC
  Administered 2017-07-21: 400 mg
  Filled 2017-07-21: qty 40

## 2017-07-21 MED ORDER — ROPIVACAINE HCL 2 MG/ML IJ SOLN
2.0000 mL | Freq: Once | INTRAMUSCULAR | Status: AC
  Administered 2017-07-21: 9 mL via EPIDURAL
  Filled 2017-07-21: qty 10

## 2017-07-21 MED ORDER — ROPIVACAINE HCL 2 MG/ML IJ SOLN
INTRAMUSCULAR | Status: AC
  Filled 2017-07-21: qty 10

## 2017-07-21 MED ORDER — METHYLPREDNISOLONE ACETATE 40 MG/ML IJ SUSP
40.0000 mg | Freq: Once | INTRAMUSCULAR | Status: AC
  Administered 2017-07-21: 40 mg
  Filled 2017-07-21: qty 1

## 2017-07-21 MED ORDER — SODIUM CHLORIDE 0.9% FLUSH
2.0000 mL | Freq: Once | INTRAVENOUS | Status: AC
  Administered 2017-07-21: 2 mL

## 2017-07-21 MED ORDER — IOPAMIDOL (ISOVUE-M 200) INJECTION 41%
10.0000 mL | Freq: Once | INTRAMUSCULAR | Status: AC
  Administered 2017-07-21: 10 mL via EPIDURAL
  Filled 2017-07-21: qty 10

## 2017-07-21 MED ORDER — TRIAMCINOLONE ACETONIDE 40 MG/ML IJ SUSP
40.0000 mg | Freq: Once | INTRAMUSCULAR | Status: AC
  Administered 2017-07-21: 40 mg
  Filled 2017-07-21: qty 1

## 2017-07-21 MED ORDER — LIDOCAINE HCL 2 % IJ SOLN: 3.0000 mL | Freq: Once | INTRAMUSCULAR | Status: DC

## 2017-07-21 NOTE — Patient Instructions (Addendum)
____________________________________________________________________________________________  Post-Procedure instructions Instructions:  Apply ice: Fill a plastic sandwich bag with crushed ice. Cover it with a small towel and apply to injection site. Apply for 15 minutes then remove x 15 minutes. Repeat sequence on day of procedure, until you go to bed. The purpose is to minimize swelling and discomfort after procedure.  Apply heat: Apply heat to procedure site starting the day following the procedure. The purpose is to treat any soreness and discomfort from the procedure.  Food intake: Start with clear liquids (like water) and advance to regular food, as tolerated.   Physical activities: Keep activities to a minimum for the first 8 hours after the procedure.   Driving: If you have received any sedation, you are not allowed to drive for 24 hours after your procedure.  Blood thinner: Restart your blood thinner 6 hours after your procedure. (Only for those taking blood thinners)  Insulin: As soon as you can eat, you may resume your normal dosing schedule. (Only for those taking insulin)  Infection prevention: Keep procedure site clean and dry.  Post-procedure Pain Diary: Extremely important that this be done correctly and accurately. Recorded information will be used to determine the next step in treatment.  Pain evaluated is that of treated area only. Do not include pain from an untreated area.  Complete every hour, on the hour, for the initial 8 hours. Set an alarm to help you do this part accurately.  Do not go to sleep and have it completed later. It will not be accurate.  Follow-up appointment: Keep your follow-up appointment after the procedure. Usually 2 weeks for most procedures. (6 weeks in the case of radiofrequency.) Bring you pain diary.  Expect:  From numbing medicine (AKA: Local Anesthetics): Numbness or decrease in pain.  Onset: Full effect within 15 minutes of  injected.  Duration: It will depend on the type of local anesthetic used. On the average, 1 to 8 hours.   From steroids: Decrease in swelling or inflammation. Once inflammation is improved, relief of the pain will follow.  Onset of benefits: Depends on the amount of swelling present. The more swelling, the longer it will take for the benefits to be seen. In some cases, up to 10 days.  Duration: Steroids will stay in the system x 2 weeks. Duration of benefits will depend on multiple posibilities including persistent irritating factors.  From procedure: Some discomfort is to be expected once the numbing medicine wears off. This should be minimal if ice and heat are applied as instructed. Call if:  You experience numbness and weakness that gets worse with time, as opposed to wearing off.  New onset bowel or bladder incontinence. (Spinal procedures only)  Emergency Numbers:  Durning business hours (Monday - Thursday, 8:00 AM - 4:00 PM) (Friday, 9:00 AM - 12:00 Noon): [phone]  After hours: [phone] ____________________________________________________________________________________________   Pain Management Discharge Instructions  General Discharge Instructions :  If you need to reach your doctor call: Monday-Friday 8:00 am - 4:00 pm at [phone] or toll free [phone].  After clinic hours [phone] to have operator reach doctor.  Bring all of your medication bottles to all your appointments in the pain clinic.  To cancel or reschedule your appointment with Pain Management please remember to call 24 hours in advance to avoid a fee.  Refer to the educational materials which you have been given on: General Risks, I had my Procedure. Discharge Instructions, Post Sedation.  Post Procedure Instructions:  The drugs   you were given will stay in your system until tomorrow, so for the next 24 hours you should not drive, make any legal decisions or drink any alcoholic  beverages.  You may eat anything you prefer, but it is better to start with liquids then soups and crackers, and gradually work up to solid foods.  Please notify your doctor immediately if you have any unusual bleeding, trouble breathing or pain that is not related to your normal pain.  Depending on the type of procedure that was done, some parts of your body may feel week and/or numb.  This usually clears up by tonight or the next day.  Walk with the use of an assistive device or accompanied by an adult for the 24 hours.  You may use ice on the affected area for the first 24 hours.  Put ice in a Ziploc bag and cover with a towel and place against area 15 minutes on 15 minutes off.  You may switch to heat after 24 hours.Epidural Steroid Injection Patient Information  Description: The epidural space surrounds the nerves as they exit the spinal cord.  In some patients, the nerves can be compressed and inflamed by a bulging disc or a tight spinal canal (spinal stenosis).  By injecting steroids into the epidural space, we can bring irritated nerves into direct contact with a potentially helpful medication.  These steroids act directly on the irritated nerves and can reduce swelling and inflammation which often leads to decreased pain.  Epidural steroids may be injected anywhere along the spine and from the neck to the low back depending upon the location of your pain.   After numbing the skin with local anesthetic (like Novocaine), a small needle is passed into the epidural space slowly.  You may experience a sensation of pressure while this is being done.  The entire block usually last less than 10 minutes.  Conditions which may be treated by epidural steroids:   Low back and leg pain  Neck and arm pain  Spinal stenosis  Post-laminectomy syndrome  Herpes zoster (shingles) pain  Pain from compression fractures  Preparation for the injection:  1. Do not eat any solid food or dairy  products within 8 hours of your appointment.  2. You may drink clear liquids up to 3 hours before appointment.  Clear liquids include water, black coffee, juice or soda.  No milk or cream please. 3. You may take your regular medication, including pain medications, with a sip of water before your appointment  Diabetics should hold regular insulin (if taken separately) and take 1/2 normal NPH dos the morning of the procedure.  Carry some sugar containing items with you to your appointment. 4. A driver must accompany you and be prepared to drive you home after your procedure.  5. Bring all your current medications with your. 6. An IV may be inserted and sedation may be given at the discretion of the physician.   7. A blood pressure cuff, EKG and other monitors will often be applied during the procedure.  Some patients may need to have extra oxygen administered for a short period. 8. You will be asked to provide medical information, including your allergies, prior to the procedure.  We must know immediately if you are taking blood thinners (like Coumadin/Warfarin)  Or if you are allergic to IV iodine contrast (dye). We must know if you could possible be pregnant.  Possible side-effects:  Bleeding from needle site  Infection (rare, may require surgery)    Nerve injury (rare)  Numbness & tingling (temporary)  Difficulty urinating (rare, temporary)  Spinal headache ( a headache worse with upright posture)  Light -headedness (temporary)  Pain at injection site (several days)  Decreased blood pressure (temporary)  Weakness in arm/leg (temporary)  Pressure sensation in back/neck (temporary)  Call if you experience:  Fever/chills associated with headache or increased back/neck pain.  Headache worsened by an upright position.  New onset weakness or numbness of an extremity below the injection site  Hives or difficulty breathing (go to the emergency room)  Inflammation or drainage at the  infection site  Severe back/neck pain  Any new symptoms which are concerning to you  Please note:  Although the local anesthetic injected can often make your back or neck feel good for several hours after the injection, the pain will likely return.  It takes 3-7 days for steroids to work in the epidural space.  You may not notice any pain relief for at least that one week.  If effective, we will often do a series of three injections spaced 3-6 weeks apart to maximally decrease your pain.  After the initial series, we generally will wait several months before considering a repeat injection of the same type.  If you have any questions, please call [phone] [facility] 

## 2017-07-21 NOTE — Progress Notes (Signed)
Safety precautions to be maintained throughout the outpatient stay will include: orient to surroundings, keep bed in low position, maintain call bell within reach at all times, provide assistance with transfer out of bed and ambulation.  

## 2017-07-21 NOTE — Progress Notes (Signed)
Patient's Name: Cynthia Bennett  MRN: [id number]  Referring Provider: Delano MetzNaveira, Doralyn Kirkes, MD  DOB: Jul 24, 1933  PCP: Jerl MinaHedrick, James, MD  DOS: 07/21/2017  Note by: Oswaldo DoneFrancisco A Jamonica Schoff, MD  Service setting: Ambulatory outpatient  Specialty: Interventional Pain Management  Patient type: Established  Location: [facility] (AMB) Pain Management Facility  Visit type: Interventional Procedure   Primary Reason for Visit: Interventional Pain Management Treatment. CC: Shoulder Pain (left); Arm Pain (left); and Back Pain (right low)  Procedure #1:  Anesthesia, Analgesia, Anxiolysis:  Type: Trigger Point Injection (1-2 muscle groups) CPT: 20552 Region: Upper extremity Level: Left biceps muscle Laterality: Left-Sided    Type: Local Anesthesia Local Anesthetic: Lidocaine 1% Route: Infiltration (Gray/IM) IV Access: Declined Sedation: Declined  Indication(s): Analgesia          Indications: 1. Left upper arm pain   2. Myofascial pain syndrome (left upper extremity)    Procedure #2:  Anesthesia, Analgesia, Anxiolysis:  Type: Therapeutic Inter-Laminar Epidural Steroid Injection Region: Lumbar Level: L4-5 Level. Laterality: Right-Sided Paramedial  Type: Local Anesthesia with Moderate (Conscious) Sedation Local Anesthetic: Lidocaine 1% Route: Intravenous (IV) IV Access: Secured Sedation: Meaningful verbal contact was maintained at all times during the procedure  Indication(s): Analgesia and Anxiety  Indications: 1. Chronic lumbar radicular pain (Location of Secondary source of pain) (Left) (L5 Dermatome)   2. Chronic lower extremity pain (Location of Secondary source of pain) (Left)    Pain Score: Pre-procedure: 5 /10 Post-procedure: 9 /10  Pre-op Assessment:  Cynthia Bennett is a 83 y.o. (year old), female patient, seen today for interventional treatment. She  has a past surgical history that includes Esophagogastroduodenoscopy (N/A, 04/16/2015); Appendectomy; and Abdominal hysterectomy. Cynthia Bennett has a current  medication list which includes the following prescription(s): bupropion, dorzolamide-timolol, doxepin, pantoprazole, sertraline, zolpidem, oxycodone, oxycodone, and oxycodone, and the following Facility-Administered Medications: lidocaine. Her primarily concern today is the Shoulder Pain (left); Arm Pain (left); and Back Pain (right low)  Initial Vital Signs: Blood pressure (!) 118/57, pulse 88, temperature 98.2 F (36.8 C), resp. rate 18, height 5\' 3"  (1.6 m), weight 170 lb (77.1 kg), SpO2 98 %. BMI: Estimated body mass index is 30.11 kg/m as calculated from the following:   Height as of this encounter: 5\' 3"  (1.6 m).   Weight as of this encounter: 170 lb (77.1 kg).  Risk Assessment: Allergies: Reviewed. She is allergic to ibuprofen and nsaids.  Allergy Precautions: None required Coagulopathies: Reviewed. None identified.  Blood-thinner therapy: None at this time Active Infection(s): Reviewed. None identified. Cynthia Bennett is afebrile  Site Confirmation: Cynthia Bennett was asked to confirm the procedure and laterality before marking the site Procedure checklist: Completed Consent: Before the procedure and under the influence of no sedative(s), amnesic(s), or anxiolytics, the patient was informed of the treatment options, risks and possible complications. To fulfill our ethical and legal obligations, as recommended by the American Medical Association's Code of Ethics, I have informed the patient of my clinical impression; the nature and purpose of the treatment or procedure; the risks, benefits, and possible complications of the intervention; the alternatives, including doing nothing; the risk(s) and benefit(s) of the alternative treatment(s) or procedure(s); and the risk(s) and benefit(s) of doing nothing. The patient was provided information about the general risks and possible complications associated with the procedure. These may include, but are not limited to: failure to achieve desired goals,  infection, bleeding, organ or nerve damage, allergic reactions, paralysis, and death. In addition, the patient was informed of those risks and complications  associated to Spine-related procedures, such as failure to decrease pain; infection (i.e.: Meningitis, epidural or intraspinal abscess); bleeding (i.e.: epidural hematoma, subarachnoid hemorrhage, or any other type of intraspinal or peri-dural bleeding); organ or nerve damage (i.e.: Any type of peripheral nerve, nerve root, or spinal cord injury) with subsequent damage to sensory, motor, and/or autonomic systems, resulting in permanent pain, numbness, and/or weakness of one or several areas of the body; allergic reactions; (i.e.: anaphylactic reaction); and/or death. Furthermore, the patient was informed of those risks and complications associated with the medications. These include, but are not limited to: allergic reactions (i.e.: anaphylactic or anaphylactoid reaction(s)); adrenal axis suppression; blood sugar elevation that in diabetics may result in ketoacidosis or comma; water retention that in patients with history of congestive heart failure may result in shortness of breath, pulmonary edema, and decompensation with resultant heart failure; weight gain; swelling or edema; medication-induced neural toxicity; particulate matter embolism and blood vessel occlusion with resultant organ, and/or nervous system infarction; and/or aseptic necrosis of one or more joints. Finally, the patient was informed that Medicine is not an exact science; therefore, there is also the possibility of unforeseen or unpredictable risks and/or possible complications that may result in a catastrophic outcome. The patient indicated having understood very clearly. We have given the patient no guarantees and we have made no promises. Enough time was given to the patient to ask questions, all of which were answered to the patient's satisfaction. Cynthia Bennett has indicated that she  wanted to continue with the procedure. Attestation: I, the ordering provider, attest that I have discussed with the patient the benefits, risks, side-effects, alternatives, likelihood of achieving goals, and potential problems during recovery for the procedure that I have provided informed consent. Date: 07/21/2017; Time: 12:31 PM  Pre-Procedure Preparation:  Monitoring: As per clinic protocol. Respiration, ETCO2, SpO2, BP, heart rate and rhythm monitor placed and checked for adequate function Safety Precautions: Patient was assessed for positional comfort and pressure points before starting the procedure. Time-out: I initiated and conducted the "Time-out" before starting the procedure, as per protocol. The patient was asked to participate by confirming the accuracy of the "Time Out" information. Verification of the correct person, site, and procedure were performed and confirmed by me, the nursing staff, and the patient. "Time-out" conducted as per Joint Commission's Universal Protocol (UP.01.01.01). "Time-out" Date & Time: 07/21/2017; 1242 hrs.  Description of Procedure #1 Process:   Position: Sitting Target Area: Trigger Point Approach: Ipsilateral approach. Area Prepped: Entire Upper arm region Prepping solution: ChloraPrep (2% chlorhexidine gluconate and 70% isopropyl alcohol) Safety Precautions: Aspiration looking for blood return was conducted prior to all injections. At no point did we inject any substances, as a needle was being advanced. No attempts were made at seeking any paresthesias. Safe injection practices and needle disposal techniques used. Medications properly checked for expiration dates. SDV (single dose vial) medications used. Description of the Procedure: Protocol guidelines were followed. The patient was placed in position over the fluoroscopy table. The target area was identified and the area prepped in the usual manner. Skin desensitized using vapocoolant spray. Skin & deeper  tissues infiltrated with local anesthetic. Appropriate amount of time allowed to pass for local anesthetics to take effect. The procedure needles were then advanced to the target area. Proper needle placement secured. Negative aspiration confirmed. Solution injected in intermittent fashion, asking for systemic symptoms every 0.5cc of injectate. The needles were then removed and the area cleansed, making sure to leave some of the prepping  solution back to take advantage of its long term bactericidal properties.  07/21/17 1244 07/21/17 1252  BP: 135/90 113/86  Pulse:    Resp: 15 16  Temp:    SpO2: 99% 95%  Weight:    Height:      Start Time: 1242 hrs. Materials:  Needle(s) Type: Epidural needle Gauge: 20G Length: 1.5-in Medication(s): We administered iopamidol, triamcinolone acetonide, ropivacaine (PF) 2 mg/mL (0.2%), sodium chloride flush, lidocaine, ropivacaine (PF) 2 mg/mL (0.2%), and methylPREDNISolone acetate. Please see chart orders for dosing details.  Imaging Guidance for Procedure #1:  Type of Imaging Technique: None used Indication(s): N/A Exposure Time: No patient exposure Contrast: None used. Fluoroscopic Guidance: N/A Ultrasound Guidance: N/A Interpretation: N/A  Description of Procedure #2 Process:   Position: Prone with head of the table was raised to facilitate breathing. Target Area: The interlaminar space, initially targeting the lower laminar border of the superior vertebral body. Approach: Paramedial approach. Area Prepped: Entire Posterior Lumbar Region Prepping solution: ChloraPrep (2% chlorhexidine gluconate and 70% isopropyl alcohol) Safety Precautions: Aspiration looking for blood return was conducted prior to all injections. At no point did we inject any substances, as a needle was being advanced. No attempts were made at seeking any paresthesias. Safe injection practices and needle disposal techniques used. Medications properly checked for expiration dates.  SDV (single dose vial) medications used. Description of the Procedure: Protocol guidelines were followed. The procedure needle was introduced through the skin, ipsilateral to the reported pain, and advanced to the target area. Bone was contacted and the needle walked caudad, until the lamina was cleared. The epidural space was identified using "loss-of-resistance technique" with 2-3 ml of PF-NaCl (0.9% NSS), in a 5cc LOR glass syringe. Vitals:   07/21/17 1244 07/21/17 1252 07/21/17 1257 07/21/17 1302  BP: 135/90 113/86 127/61 125/61  Pulse:      Resp: Temp:      SpO2: 99% 95% 94% 95%  Weight:      Height:       End Time: 1303 hrs. Materials:  Needle(s) Type: Epidural needle Gauge: 17G Length: 3.5-in Medication(s): We administered iopamidol, triamcinolone acetonide, ropivacaine (PF) 2 mg/mL (0.2%), sodium chloride flush, lidocaine, ropivacaine (PF) 2 mg/mL (0.2%), and methylPREDNISolone acetate. Please see chart orders for dosing details.  Imaging Guidance (Spinal) for Procedure #2:  Type of Imaging Technique: Fluoroscopy Guidance (Spinal) Indication(s): Assistance in needle guidance and placement for procedures requiring needle placement in or near specific anatomical locations not easily accessible without such assistance. Exposure Time: Please see nurses notes. Contrast: Before injecting any contrast, we confirmed that the patient did not have an allergy to iodine, shellfish, or radiological contrast. Once satisfactory needle placement was completed at the desired level, radiological contrast was injected. Contrast injected under live fluoroscopy. No contrast complications. See chart for type and volume of contrast used. Fluoroscopic Guidance: I was personally present during the use of fluoroscopy. "Tunnel Vision Technique" used to obtain the best possible view of the target area. Parallax error corrected before commencing the procedure. "Direction-depth-direction" technique  used to introduce the needle under continuous pulsed fluoroscopy. Once target was reached, antero-posterior, oblique, and lateral fluoroscopic projection used confirm needle placement in all planes. Images permanently stored in EMR. Interpretation: I personally interpreted the imaging intraoperatively. Adequate needle placement confirmed in multiple planes. Appropriate spread of contrast into desired area was observed. No evidence of afferent or efferent intravascular uptake. No intrathecal or subarachnoid spread observed. Permanent images saved into the patient's record.  Antibiotic Prophylaxis:  Indication(s): None identified Antibiotic given: None  Post-operative Assessment:  EBL: None Complications: No immediate post-treatment complications observed by team, or reported by patient. Note: The patient tolerated the entire procedure well. A repeat set of vitals were taken after the procedure and the patient was kept under observation following institutional policy, for this type of procedure. Post-procedural neurological assessment was performed, showing return to baseline, prior to discharge. The patient was provided with post-procedure discharge instructions, including a section on how to identify potential problems. Should any problems arise concerning this procedure, the patient was given instructions to immediately contact us, at any time, without hesitation. In any case, we plan to contact the patient by telephone for a follow-up status report regarding this interventional procedure. Comments:  No additional relevant information.  Plan of Care  Disposition: Discharge home  Discharge Date & Time: 07/21/2017; 1310 hrs.   Physician-requested Follow-up:  Return for post-procedure eval by Dr. Laban Emperor in 2 weeks.  Future Appointments Date Time Provider Department Center  08/03/2017 10:45 AM Delano Metz, MD [facility] None    Imaging Orders     DG C-Arm 1-60 Min-No Report  Procedure  Orders     TRIGGER POINT INJECTION  Medications ordered for procedure: Meds ordered this encounter  Medications  . iopamidol (ISOVUE-M) 41 % intrathecal injection 10 mL  . triamcinolone acetonide (KENALOG-40) injection 40 mg  . ropivacaine (PF) 2 mg/mL (0.2%) (NAROPIN) injection 2 mL  . sodium chloride flush (NS) 0.9 % injection 2 mL  . lidocaine (XYLOCAINE) 2 % (with pres) injection 200 mg  . lidocaine (XYLOCAINE) 2 % (with pres) injection 60 mg  . ropivacaine (PF) 2 mg/mL (0.2%) (NAROPIN) injection 9 mL  . methylPREDNISolone acetate (DEPO-MEDROL) injection 40 mg   Medications administered: We administered iopamidol, triamcinolone acetonide, ropivacaine (PF) 2 mg/mL (0.2%), sodium chloride flush, lidocaine, ropivacaine (PF) 2 mg/mL (0.2%), and methylPREDNISolone acetate.  See the medical record for exact dosing, route, and time of administration.  New Prescriptions   No medications on file   Primary Care Physician: Jerl Mina, MD Location: [facility] Note by: Oswaldo Done, MD Date: 07/21/2017; Time: 1:26 PM  Disclaimer:  Medicine is not an Visual merchandiser. The only guarantee in medicine is that nothing is guaranteed. It is important to note that the decision to proceed with this intervention was based on the information collected from the patient. The Data and conclusions were drawn from the patient's questionnaire, the interview, and the physical examination. Because the information was provided in large part by the patient, it cannot be guaranteed that it has not been purposely or unconsciously manipulated. Every effort has been made to obtain as much relevant data as possible for this evaluation. It is important to note that the conclusions that lead to this procedure are derived in large part from the available data. Always take into account that the treatment will also be dependent on availability of resources and existing treatment guidelines,  considered by other Pain Management Practitioners as being common knowledge and practice, at the time of the intervention. For Medico-Legal purposes, it is also important to point out that variation in procedural techniques and pharmacological choices are the acceptable norm. The indications, contraindications, technique, and results of the above procedure should only be interpreted and judged by a Board-Certified Interventional Pain Specialist with extensive familiarity and expertise in the same exact procedure and technique.

## 2017-07-22 ENCOUNTER — Telehealth: Payer: Self-pay | Admitting: *Deleted

## 2017-07-22 NOTE — Telephone Encounter (Signed)
No problems post procedure. 

## 2017-08-02 NOTE — Progress Notes (Deleted)
Patient's Name: Cynthia Bennett  MRN: [id number]  Referring Provider: [name], MD  DOB: October 29, 1933  PCP: [name], MD  DOS: 08/03/2017  Note by: Gaspar Cola, MD  Service setting: Ambulatory outpatient  Specialty: Interventional Pain Management  Location: [facility] (AMB) Pain Management Facility    Patient type: Established   Primary Reason(s) for Visit: Encounter for prescription drug management & post-procedure evaluation of chronic illness with mild to moderate exacerbation(Level of risk: moderate) CC: No chief complaint on file.  HPI  [name] is a 83 y.o. year old, female patient, who comes today for a post-procedure evaluation and medication management. She has Fibromyalgia; Adiposity; Current tobacco use; Bilateral low back pain with left-sided sciatica; Chronic pain syndrome; Lumbar radicular pain (Bilateral) (R>L); Encounter for therapeutic drug level monitoring; Long term current use of opiate analgesic; Long term prescription opiate use; Uncomplicated opioid dependence ([place]); Opiate use; Acid reflux; HLD (hyperlipidemia); Cannot sleep; Primary open angle glaucoma; Post menopausal syndrome; Temporary cerebral vascular dysfunction; Chronic low back pain (Primary Source of Pain) (Bilateral) (L>R); Grade 1 spondylolisthesis of L4 over L5; Severe (5-6 mm) L4-5 lumbar spinal stenosis and (8 mm) L3-4 spinal stenosis.; Lumbar discogenic pain syndrome; Lumbar facet syndrome (Bilateral) (L>R); Lumbar facet hypertrophy (Bilateral); Chronic sacroiliac joint pain (Left); Chronic hip pain (Left); Peptic ulcer disease; Morbid obesity ([place]); GERD (gastroesophageal reflux disease); Encounter for chronic pain management; Opiate analgesic contract exists ([facility]); Lumbar spondylosis; Abnormal MRI, lumbar spine (06/25/2016); Cervical spondylosis (Anterolisthesis of C5 over C6); Difficulty voiding; Neurogenic bladder; Chronic lower extremity pain (Secondary source of  pain) (Left); Chronic lumbar radicular pain (Left) (L5 Dermatome); Osteoarthritis of hip (Left); Chronic hip pain (Bilateral) (R>L); Chronic sacroiliac joint pain (Bilateral) (R>L); Kidney disease, chronic, stage III (GFR 30-59 ml/min); Chronic shoulder pain (Left); Osteoarthritis of shoulder (Left); Chronic sacroiliac joint pain (Right); Chronic hip pain (Right); Left upper arm pain; and Myofascial pain syndrome (left upper extremity) on her problem list. Her primarily concern today is the No chief complaint on file.  Pain Assessment: Location:     Radiating:   Onset:   Duration:   Quality:   Severity:  /10 (self-reported pain score)  Note: Reported level is compatible with observation.                   When using our objective Pain Scale, any level above a 5/10 is said to belong in an emergency room, as it progressively worsens from a 6/10, which is described as severely limiting. Requiring emergency care not usually available at an outpatient pain management facility. Communication becomes difficult and requires great effort. Assistance to reach the emergency department may be required. Facial flushing and profuse sweating along with potentially dangerous increases in heart rate and blood pressure will be evident. Effect on ADL:   Timing:   Modifying factors:    [name] was last seen on 07/21/2017 for a procedure. During today's appointment we reviewed [name]'s post-procedure results, as well as her outpatient medication regimen.  Further details on both, my assessment(s), as well as the proposed treatment plan, please see below.  Controlled Substance Pharmacotherapy Assessment REMS (Risk Evaluation and Mitigation Strategy)  Analgesic: Hydrocodone/APAP 5/325 One and a half tablets per day (7.22m/day) MME/day:7.511mday  No notes on file Pharmacokinetics: Liberation and absorption (onset of action): WNL Distribution (time to peak effect): WNL Metabolism and excretion (duration of  action): WNL         Pharmacodynamics: Desired effects: Analgesia:  [name] reports >50% benefit. Functional ability: Patient reports that medication allows her to accomplish basic ADLs Clinically meaningful improvement in function (CMIF): Sustained CMIF goals met Perceived effectiveness: Described as relatively effective, allowing for increase in activities of daily living (ADL) Undesirable effects: Side-effects or Adverse reactions: None reported Monitoring: [place] PMP: Online review of the past 68-monthperiod conducted. Compliant with practice rules and regulations List of all UDS test(s) done:  Lab Results  Component Value Date   TOXASSSELUR FINAL 01/01/2017   TNeiltonFINAL 01/23/2016   Last UDS on record: ToxAssure Select 13  Date Value Ref Range Status  01/01/2017 FINAL  Final    Comment:    ==================================================================== TOXASSURE SELECT 13 (MW) ==================================================================== Test                             Result       Flag       Units Drug Present and Declared for Prescription Verification   Hydrocodone                    1447         EXPECTED   ng/mg creat   Hydromorphone                  37           EXPECTED   ng/mg creat   Dihydrocodeine                 209          EXPECTED   ng/mg creat   Norhydrocodone                 2341         EXPECTED   ng/mg creat    Sources of hydrocodone include scheduled prescription    medications. Hydromorphone, dihydrocodeine and norhydrocodone are    expected metabolites of hydrocodone. Hydromorphone and    dihydrocodeine are also available as scheduled prescription    medications. ==================================================================== Test                      Result    Flag   Units      Ref Range   Creatinine              152              mg/dL      >=20 ==================================================================== Declared  Medications:  The flagging and interpretation on this report are based on the  following declared medications.  Unexpected results may arise from  inaccuracies in the declared medications.  **Note: The testing scope of this panel includes these medications:  Hydrocodone (Hydrocodone-Acetaminophen)  **Note: The testing scope of this panel does not include following  reported medications:  Acetaminophen (Hydrocodone-Acetaminophen)  Bupropion (Wellbutrin)  Dorzolamide  Doxepin  Multivitamin (MVI)  Pantoprazole  Timolol  Zolpidem (Ambien) ==================================================================== For clinical consultation, please call ([phone]====    UDS interpretation: Compliant          Medication Assessment Form: Reviewed. Patient indicates being compliant with therapy Treatment compliance: Compliant Risk Assessment Profile: Aberrant behavior: See prior evaluations. None observed or detected today Comorbid factors increasing risk of overdose: See prior notes. No additional risks detected today Risk of substance use disorder (SUD): Low     Opioid Risk Tool - 07/21/17 1137      Family History of Substance  Abuse   Alcohol Negative   Illegal Drugs Negative   Rx Drugs Negative     Personal History of Substance Abuse   Alcohol Negative   Illegal Drugs Negative   Rx Drugs Negative     Age   Age between 46-45 years  No     History of Preadolescent Sexual Abuse   History of Preadolescent Sexual Abuse Negative or Female     Psychological Disease   Psychological Disease Negative   Depression Negative     Total Score   Opioid Risk Tool Scoring 0   Opioid Risk Interpretation Low Risk     ORT Scoring interpretation table:  Score <3 = Low Risk for SUD  Score between 4-7 = Moderate Risk for SUD  Score >8 = High Risk for Opioid Abuse   Risk Mitigation Strategies:  Patient Counseling:  Covered Patient-Prescriber Agreement (PPA): Present and active  Notification to other healthcare providers: Done  Pharmacologic Plan: No change in therapy, at this time  Post-Procedure Assessment  07/21/2017 Procedure: Palliative left-sided L4-5 Interlaminar lumbar epidural steroid injection + Left biceps muscle MNB under fluoro and IV sedation. Pre-procedure pain score:  5/10 Post-procedure pain score: 9/10         Influential Factors: BMI:   Intra-procedural challenges: None observed.         Assessment challenges: None detected.              Reported side-effects: None.        Post-procedural adverse reactions or complications: None reported         Sedation: Sedation provided. When no sedatives are used, the analgesic levels obtained are directly associated to the effectiveness of the local anesthetics. However, when sedation is provided, the level of analgesia obtained during the initial 1 hour following the intervention, is believed to be the result of a combination of factors. These factors may include, but are not limited to: 1. The effectiveness of the local anesthetics used. 2. The effects of the analgesic(s) and/or anxiolytic(s) used. 3. The degree of discomfort experienced by the patient at the time of the procedure. 4. The patients ability and reliability in recalling and recording the events. 5. The presence and influence of possible secondary gains and/or psychosocial factors. Reported result: Relief experienced during the 1st hour after the procedure:   (Ultra-Short Term Relief)            Interpretative annotation: Clinically appropriate result. Analgesia during this period is likely to be Local Anesthetic and/or IV Sedative (Analgesic/Anxiolytic) related.          Effects of local anesthetic: The analgesic effects attained during this period are directly associated to the localized infiltration of local anesthetics and therefore cary significant diagnostic value as to the  etiological location, or anatomical origin, of the pain. Expected duration of relief is directly dependent on the pharmacodynamics of the local anesthetic used. Long-acting (4-6 hours) anesthetics used.  Reported result: Relief during the next 4 to 6 hour after the procedure:   (Short-Term Relief)            Interpretative annotation: Clinically appropriate result. Analgesia during this period is likely to be Local Anesthetic-related.          Long-term benefit: Defined as the period of time past the expected duration of local anesthetics (1 hour for short-acting and 4-6 hours for long-acting). With the possible exception of prolonged sympathetic blockade from the local anesthetics, benefits during this period are typically attributed to,  or associated with, other factors such as analgesic sensory neuropraxia, antiinflammatory effects, or beneficial biochemical changes provided by agents other than the local anesthetics.  Reported result: Extended relief following procedure:   (Long-Term Relief)            Interpretative annotation: Clinically appropriate result. Good relief. No permanent benefit expected. Inflammation plays a part in the etiology to the pain.          Current benefits: Defined as reported results that persistent at this point in time.   Analgesia: *** %            Function: Somewhat improved ROM: Somewhat improved Interpretative annotation: Recurrence of symptoms. No permanent benefit expected. Effective diagnostic intervention.          Interpretation: Results would suggest a successful diagnostic intervention.                  Plan:  Please see "Plan of Care" for details.       Laboratory Chemistry  Inflammation Markers (CRP: Acute Phase) (ESR: Chronic Phase) Lab Results  Component Value Date   CRP 0.6 06/24/2016   ESRSEDRATE 23 06/24/2016                 Renal Function Markers Lab Results  Component Value Date   BUN 13 06/24/2016   CREATININE 0.95 06/24/2016    GFRAA >60 06/24/2016   GFRNONAA 55 (L) 06/24/2016                 Hepatic Function Markers Lab Results  Component Value Date   AST 17 06/24/2016   ALT 11 (L) 06/24/2016   ALBUMIN 3.9 06/24/2016   ALKPHOS 70 06/24/2016                 Electrolytes Lab Results  Component Value Date   NA 137 06/24/2016   K 4.3 06/24/2016   CL 107 06/24/2016   CALCIUM 9.1 06/24/2016   MG 2.2 06/24/2016                 Neuropathy Markers Lab Results  Component Value Date   VITAMINB12 240 06/24/2016                 Bone Pathology Markers Lab Results  Component Value Date   ALKPHOS 70 06/24/2016   25OHVITD1 26 (L) 06/24/2016   25OHVITD2 5.6 06/24/2016   25OHVITD3 20 06/24/2016   CALCIUM 9.1 06/24/2016                 Coagulation Parameters Lab Results  Component Value Date   INR 0.96 06/24/2016   LABPROT 12.8 06/24/2016   PLT 291 06/24/2016                 Cardiovascular Markers Lab Results  Component Value Date   HGB 12.7 06/24/2016   HCT 38.6 06/24/2016                 Note: Lab results reviewed.  Recent Diagnostic Imaging Results  DG C-Arm 1-60 Min-No Report Fluoroscopy was utilized by the requesting physician.  No radiographic  interpretation.   Note: Imaging results reviewed.          Meds   Current Outpatient Prescriptions:  .  buPROPion (WELLBUTRIN SR) 150 MG 12 hr tablet, TAKE 1 TABLET BY MOUTH ONCE DAILY, Disp: , Rfl:  .  dorzolamide-timolol (COSOPT) 22.3-6.8 MG/ML ophthalmic solution, INT 1 GTT IN OU BID, Disp: , Rfl: 5 .  doxepin (  SINEQUAN) 100 MG capsule, Take 100 mg by mouth at bedtime., Disp: , Rfl:  .  oxyCODONE (OXY IR/ROXICODONE) 5 MG immediate release tablet, Take 1 tablet (5 mg total) by mouth every 12 (twelve) hours as needed for severe pain., Disp: 60 tablet, Rfl: 0 .  oxyCODONE (OXY IR/ROXICODONE) 5 MG immediate release tablet, Take 1 tablet (5 mg total) by mouth every 12 (twelve) hours as needed for severe pain., Disp: 60 tablet, Rfl: 0 .   oxyCODONE (OXY IR/ROXICODONE) 5 MG immediate release tablet, Take 1 tablet (5 mg total) by mouth every 12 (twelve) hours as needed for severe pain., Disp: 60 tablet, Rfl: 0 .  pantoprazole (PROTONIX) 40 MG tablet, Take 1 tablet by mouth 2 (two) times daily., Disp: , Rfl: 11 .  sertraline (ZOLOFT) 100 MG tablet, Take 1 tablet by mouth every morning., Disp: , Rfl:  .  zolpidem (AMBIEN) 10 MG tablet, TAKE 1/2 TO 1 TABLET BY MOUTH EVERY NIGHT AT BEDTIME, Disp: , Rfl:   ROS  Constitutional: Denies any fever or chills Gastrointestinal: No reported hemesis, hematochezia, vomiting, or acute GI distress Musculoskeletal: Denies any acute onset joint swelling, redness, loss of ROM, or weakness Neurological: No reported episodes of acute onset apraxia, aphasia, dysarthria, agnosia, amnesia, paralysis, loss of coordination, or loss of consciousness  Allergies  Ms. Fantroy is allergic to ibuprofen and nsaids.  PFSH  Drug: Ms. Zobrist  reports that she does not use drugs. Alcohol:  reports that she does not drink alcohol. Tobacco:  reports that she has quit smoking. She has never used smokeless tobacco. Medical:  has a past medical history of Fibromyalgia; Glaucoma; Hiatal hernia; Osteoarthritis; Osteoarthritis of spine with radiculopathy, lumbar region (08/14/2015); Peptic ulcer disease; Reflux; Spinal stenosis; Stroke [facility]); and TIA (transient ischemic attack). Surgical: Ms. Barwick  has a past surgical history that includes Esophagogastroduodenoscopy (N/A, 04/16/2015); Appendectomy; and Abdominal hysterectomy. Family: family history includes COPD in her father; Mental illness in her mother.  Constitutional Exam  General appearance: Well nourished, well developed, and well hydrated. In no apparent acute distress There were no vitals filed for this visit. BMI Assessment: Estimated body mass index is 30.11 kg/m as calculated from the following:   Height as of 07/21/17: 5' 3" (1.6 m).   Weight as of 07/21/17: 170 lb  (77.1 kg).  BMI interpretation table: BMI level Category Range association with higher incidence of chronic pain  <18 kg/m2 Underweight   18.5-24.9 kg/m2 Ideal body weight   25-29.9 kg/m2 Overweight Increased incidence by 20%  30-34.9 kg/m2 Obese (Class I) Increased incidence by 68%  35-39.9 kg/m2 Severe obesity (Class II) Increased incidence by 136%  >40 kg/m2 Extreme obesity (Class III) Increased incidence by 254%   BMI Readings from Last 4 Encounters:  07/21/17 30.11 kg/m  05/28/17 32.01 kg/m  04/29/17 32.01 kg/m  04/15/17 32.01 kg/m   Wt Readings from Last 4 Encounters:  07/21/17 170 lb (77.1 kg)  05/28/17 175 lb (79.4 kg)  04/29/17 175 lb (79.4 kg)  04/15/17 175 lb (79.4 kg)  Psych/Mental status: Alert, oriented x 3 (person, place, & time)       Eyes: PERLA Respiratory: No evidence of acute respiratory distress  Cervical Spine Area Exam  Skin & Axial Inspection: No masses, redness, edema, swelling, or associated skin lesions Alignment: Symmetrical Functional ROM: Unrestricted ROM      Stability: No instability detected Muscle Tone/Strength: Functionally intact. No obvious neuro-muscular anomalies detected. Sensory (Neurological): Unimpaired Palpation: No palpable anomalies  Upper Extremity (UE) Exam    Side: Right upper extremity  Side: Left upper extremity  Skin & Extremity Inspection: Skin color, temperature, and hair growth are WNL. No peripheral edema or cyanosis. No masses, redness, swelling, asymmetry, or associated skin lesions. No contractures.  Skin & Extremity Inspection: Skin color, temperature, and hair growth are WNL. No peripheral edema or cyanosis. No masses, redness, swelling, asymmetry, or associated skin lesions. No contractures.  Functional ROM: Unrestricted ROM          Functional ROM: Unrestricted ROM          Muscle Tone/Strength: Functionally intact. No obvious neuro-muscular anomalies detected.  Muscle Tone/Strength: Functionally  intact. No obvious neuro-muscular anomalies detected.  Sensory (Neurological): Unimpaired          Sensory (Neurological): Unimpaired          Palpation: No palpable anomalies              Palpation: No palpable anomalies              Specialized Test(s): Deferred         Specialized Test(s): Deferred          Thoracic Spine Area Exam  Skin & Axial Inspection: No masses, redness, or swelling Alignment: Symmetrical Functional ROM: Unrestricted ROM Stability: No instability detected Muscle Tone/Strength: Functionally intact. No obvious neuro-muscular anomalies detected. Sensory (Neurological): Unimpaired Muscle strength & Tone: No palpable anomalies  Lumbar Spine Area Exam  Skin & Axial Inspection: No masses, redness, or swelling Alignment: Symmetrical Functional ROM: Unrestricted ROM      Stability: No instability detected Muscle Tone/Strength: Functionally intact. No obvious neuro-muscular anomalies detected. Sensory (Neurological): Unimpaired Palpation: No palpable anomalies       Provocative Tests: Lumbar Hyperextension and rotation test: evaluation deferred today       Lumbar Lateral bending test: evaluation deferred today       Patrick's Maneuver: evaluation deferred today                    Gait & Posture Assessment  Ambulation: Unassisted Gait: Relatively normal for age and body habitus Posture: WNL   Lower Extremity Exam    Side: Right lower extremity  Side: Left lower extremity  Skin & Extremity Inspection: Skin color, temperature, and hair growth are WNL. No peripheral edema or cyanosis. No masses, redness, swelling, asymmetry, or associated skin lesions. No contractures.  Skin & Extremity Inspection: Skin color, temperature, and hair growth are WNL. No peripheral edema or cyanosis. No masses, redness, swelling, asymmetry, or associated skin lesions. No contractures.  Functional ROM: Unrestricted ROM          Functional ROM: Unrestricted ROM          Muscle Tone/Strength:  Functionally intact. No obvious neuro-muscular anomalies detected.  Muscle Tone/Strength: Functionally intact. No obvious neuro-muscular anomalies detected.  Sensory (Neurological): Unimpaired  Sensory (Neurological): Unimpaired  Palpation: No palpable anomalies  Palpation: No palpable anomalies   Assessment  Primary Diagnosis & Pertinent Problem List: The primary encounter diagnosis was Chronic pain syndrome. Diagnoses of Chronic low back pain (Primary Source of Pain) (Bilateral) (L>R), Chronic lower extremity pain (Secondary source of pain) (Left), Chronic sacroiliac joint pain (Bilateral) (R>L), Lumbar facet syndrome (Bilateral) (L>R), Lumbar radicular pain (Bilateral) (R>L), and Lumbar spondylosis were also pertinent to this visit.  Status Diagnosis  Controlled Controlled Controlled 1. Chronic pain syndrome   2. Chronic low back pain (Primary Source of Pain) (Bilateral) (L>R)   3. Chronic  lower extremity pain (Secondary source of pain) (Left)   4. Chronic sacroiliac joint pain (Bilateral) (R>L)   5. Lumbar facet syndrome (Bilateral) (L>R)   6. Lumbar radicular pain (Bilateral) (R>L)   7. Lumbar spondylosis     Problems updated and reviewed during this visit: Problem  Chronic lower extremity pain (Secondary source of pain) (Left)  Chronic lumbar radicular pain (Left) (L5 Dermatome)  Chronic low back pain (Primary Source of Pain) (Bilateral) (L>R)  Lumbar facet syndrome (Bilateral) (L>R)   Plan of Care  Pharmacotherapy (Medications Ordered): No orders of the defined types were placed in this encounter.  New Prescriptions   No medications on file   Medications administered today: Ms. Jolley had no medications administered during this visit.  Procedure Orders    No procedure(s) ordered today   Lab Orders  No laboratory test(s) ordered today   Imaging Orders  No imaging studies ordered today   Referral Orders  No referral(s) requested today    Interventional management  options: Planned, scheduled, and/or pending:   ***   Considering:   Diagnostic bilateral lumbar facet block  Possible bilateral lumbar facet RFA  Diagnostic bilateral sacroiliac joint block  Possible bilateral sacroiliac joint RFA  Diagnostic left L4-5 transforaminal epidural steroid injection Palliative left L4-5 interlaminar epidural steroid injection Diagnostic bilateral intra-articular hip joint injection  Diagnostic bilateral femoral nerve + obturator nerve block  Possible bilateral femoral nerve + obturator nerve RFA    Palliative PRN treatment(s):   Palliative left-sided L4-5 Interlaminar lumbar epidural steroid injection  Palliative L5-S1 interlaminar lumbar epiduralsteroid injection  Palliative left intra-articular shoulder joint injection  Palliative right sacroiliac joint block    Provider-requested follow-up: No Follow-up on file.  Future Appointments Date Time Provider [place]  08/03/2017 10:45 AM Milinda Pointer, MD [facility] None   Primary Care Physician: [name], MD Location: [facility] Outpatient Pain Management Facility Note by: Gaspar Cola, MD Date: 08/03/2017; Time: 6:38 PM

## 2017-08-03 ENCOUNTER — Encounter: Payer: PPO | Admitting: Pain Medicine

## 2017-08-03 DIAGNOSIS — E538 Deficiency of other specified B group vitamins: Secondary | ICD-10-CM | POA: Diagnosis not present

## 2017-08-03 DIAGNOSIS — R350 Frequency of micturition: Secondary | ICD-10-CM | POA: Diagnosis not present

## 2017-08-03 DIAGNOSIS — R5383 Other fatigue: Secondary | ICD-10-CM | POA: Diagnosis not present

## 2017-08-03 DIAGNOSIS — R8290 Unspecified abnormal findings in urine: Secondary | ICD-10-CM | POA: Diagnosis not present

## 2017-08-26 DIAGNOSIS — D72829 Elevated white blood cell count, unspecified: Secondary | ICD-10-CM | POA: Diagnosis not present

## 2017-09-02 DIAGNOSIS — R682 Dry mouth, unspecified: Secondary | ICD-10-CM | POA: Diagnosis not present

## 2017-09-02 DIAGNOSIS — R42 Dizziness and giddiness: Secondary | ICD-10-CM | POA: Diagnosis not present

## 2017-09-02 DIAGNOSIS — E559 Vitamin D deficiency, unspecified: Secondary | ICD-10-CM | POA: Diagnosis not present

## 2017-09-02 DIAGNOSIS — H04123 Dry eye syndrome of bilateral lacrimal glands: Secondary | ICD-10-CM | POA: Diagnosis not present

## 2017-09-02 DIAGNOSIS — R5381 Other malaise: Secondary | ICD-10-CM | POA: Diagnosis not present

## 2017-09-02 DIAGNOSIS — R5383 Other fatigue: Secondary | ICD-10-CM | POA: Diagnosis not present

## 2017-09-02 DIAGNOSIS — E538 Deficiency of other specified B group vitamins: Secondary | ICD-10-CM | POA: Diagnosis not present

## 2017-10-13 DIAGNOSIS — Z23 Encounter for immunization: Secondary | ICD-10-CM | POA: Diagnosis not present

## 2017-10-13 DIAGNOSIS — R42 Dizziness and giddiness: Secondary | ICD-10-CM | POA: Diagnosis not present

## 2017-10-13 DIAGNOSIS — Z0000 Encounter for general adult medical examination without abnormal findings: Secondary | ICD-10-CM | POA: Diagnosis not present

## 2017-10-21 ENCOUNTER — Ambulatory Visit: Payer: PPO | Attending: Pain Medicine | Admitting: Pain Medicine

## 2017-10-21 ENCOUNTER — Other Ambulatory Visit: Payer: Self-pay

## 2017-10-21 ENCOUNTER — Encounter: Payer: Self-pay | Admitting: Pain Medicine

## 2017-10-21 VITALS — BP 102/70 | HR 76 | Temp 97.5°F | Resp 16 | Ht 63.0 in | Wt 170.0 lb

## 2017-10-21 DIAGNOSIS — M4726 Other spondylosis with radiculopathy, lumbar region: Secondary | ICD-10-CM | POA: Diagnosis not present

## 2017-10-21 DIAGNOSIS — K219 Gastro-esophageal reflux disease without esophagitis: Secondary | ICD-10-CM | POA: Diagnosis not present

## 2017-10-21 DIAGNOSIS — M48061 Spinal stenosis, lumbar region without neurogenic claudication: Secondary | ICD-10-CM | POA: Insufficient documentation

## 2017-10-21 DIAGNOSIS — M19012 Primary osteoarthritis, left shoulder: Secondary | ICD-10-CM | POA: Insufficient documentation

## 2017-10-21 DIAGNOSIS — Z8673 Personal history of transient ischemic attack (TIA), and cerebral infarction without residual deficits: Secondary | ICD-10-CM | POA: Insufficient documentation

## 2017-10-21 DIAGNOSIS — Z720 Tobacco use: Secondary | ICD-10-CM | POA: Diagnosis not present

## 2017-10-21 DIAGNOSIS — Z6830 Body mass index (BMI) 30.0-30.9, adult: Secondary | ICD-10-CM | POA: Insufficient documentation

## 2017-10-21 DIAGNOSIS — M545 Low back pain: Secondary | ICD-10-CM | POA: Diagnosis not present

## 2017-10-21 DIAGNOSIS — G8929 Other chronic pain: Secondary | ICD-10-CM | POA: Diagnosis not present

## 2017-10-21 DIAGNOSIS — Z79899 Other long term (current) drug therapy: Secondary | ICD-10-CM | POA: Insufficient documentation

## 2017-10-21 DIAGNOSIS — M25552 Pain in left hip: Secondary | ICD-10-CM | POA: Insufficient documentation

## 2017-10-21 DIAGNOSIS — M797 Fibromyalgia: Secondary | ICD-10-CM | POA: Insufficient documentation

## 2017-10-21 DIAGNOSIS — F1120 Opioid dependence, uncomplicated: Secondary | ICD-10-CM | POA: Diagnosis not present

## 2017-10-21 DIAGNOSIS — G894 Chronic pain syndrome: Secondary | ICD-10-CM | POA: Insufficient documentation

## 2017-10-21 DIAGNOSIS — M4312 Spondylolisthesis, cervical region: Secondary | ICD-10-CM | POA: Diagnosis not present

## 2017-10-21 DIAGNOSIS — E785 Hyperlipidemia, unspecified: Secondary | ICD-10-CM | POA: Diagnosis not present

## 2017-10-21 DIAGNOSIS — N183 Chronic kidney disease, stage 3 (moderate): Secondary | ICD-10-CM | POA: Insufficient documentation

## 2017-10-21 DIAGNOSIS — M533 Sacrococcygeal disorders, not elsewhere classified: Secondary | ICD-10-CM | POA: Insufficient documentation

## 2017-10-21 DIAGNOSIS — M4316 Spondylolisthesis, lumbar region: Secondary | ICD-10-CM | POA: Diagnosis not present

## 2017-10-21 DIAGNOSIS — M5442 Lumbago with sciatica, left side: Secondary | ICD-10-CM | POA: Insufficient documentation

## 2017-10-21 DIAGNOSIS — M47812 Spondylosis without myelopathy or radiculopathy, cervical region: Secondary | ICD-10-CM | POA: Diagnosis not present

## 2017-10-21 DIAGNOSIS — M25512 Pain in left shoulder: Secondary | ICD-10-CM | POA: Diagnosis not present

## 2017-10-21 DIAGNOSIS — M79605 Pain in left leg: Secondary | ICD-10-CM | POA: Diagnosis not present

## 2017-10-21 DIAGNOSIS — M7918 Myalgia, other site: Secondary | ICD-10-CM

## 2017-10-21 MED ORDER — OXYCODONE HCL 5 MG PO TABS: 5.0000 mg | ORAL_TABLET | Freq: Two times a day (BID) | ORAL | 0 refills | Status: DC | PRN

## 2017-10-21 NOTE — Progress Notes (Signed)
Nursing Pain Medication Assessment:  Safety precautions to be maintained throughout the outpatient stay will include: orient to surroundings, keep bed in low position, maintain call bell within reach at all times, provide assistance with transfer out of bed and ambulation.  Medication Inspection Compliance: Pill count conducted under aseptic conditions, in front of the patient. Neither the pills nor the bottle was removed from the patient's sight at any time. Once count was completed pills were immediately returned to the patient in their original bottle.  Medication: Oxycodone ER (OxyContin) Pill/Patch Count: 8 of 60 pills remain Pill/Patch Appearance: Markings consistent with prescribed medication Bottle Appearance: Standard pharmacy container. Clearly labeled. Filled Date: 09 / 27 / 2018 Last Medication intake:  Yesterday

## 2017-10-21 NOTE — Patient Instructions (Addendum)
____________________________________________________________________________________________ Pre procedure instructions given, Prescriptions for oxycodone x3 given Preparing for your procedure (without sedation) Instructions: . Oral Intake: Do not eat or drink anything for at least 3 hours prior to your procedure. . Transportation: Unless otherwise stated by your physician, you may drive yourself after the procedure. . Blood Pressure Medicine: Take your blood pressure medicine with a sip of water the morning of the procedure. . Blood thinners:  . Diabetics on insulin: Notify the staff so that you can be scheduled 1st case in the morning. If your diabetes requires high dose insulin, take only  of your normal insulin dose the morning of the procedure and notify the staff that you have done so. . Preventing infections: Shower with an antibacterial soap the morning of your procedure.  . Build-up your immune system: Take 1000 mg of Vitamin C with every meal (3 times a day) the day prior to your procedure. Marland Kitchen. Antibiotics: Inform the staff if you have a condition or reason that requires you to take antibiotics before dental procedures. . Pregnancy: If you are pregnant, call and cancel the procedure. . Sickness: If you have a cold, fever, or any active infections, call and cancel the procedure. . Arrival: You must be in the facility at least 30 minutes prior to your scheduled procedure. . Children: Do not bring any children with you. . Dress appropriately: Bring dark clothing that you would not mind if they get stained. . Valuables: Do not bring any jewelry or valuables. Procedure appointments are reserved for interventional treatments only. Marland Kitchen. No Prescription Refills. . No medication changes will be discussed during procedure appointments. . No disability issues will be discussed. ____________________________________________________________________________________________

## 2017-10-21 NOTE — Progress Notes (Deleted)
Delete - duplicate 

## 2017-10-21 NOTE — Progress Notes (Signed)
Patient's Name: Cynthia Bennett  MRN: [id number]  Referring Provider: [name], MD  DOB: 1933-08-26  PCP: [name], MD  DOS: 10/21/2017  Note by: Gaspar Cola, MD  Service setting: Ambulatory outpatient  Specialty: Interventional Pain Management  Location: [facility] (AMB) Pain Management Facility    Patient type: Established   Primary Reason(s) for Visit: Encounter for prescription drug management & post-procedure evaluation of chronic illness with mild to moderate exacerbation(Level of risk: moderate) CC: Shoulder Pain (left)  HPI  Cynthia Bennett is a 82 y.o. year old, female patient, who comes today for a post-procedure evaluation and medication management. She has Fibromyalgia; Adiposity; Current tobacco use; Bilateral low back pain with left-sided sciatica; Chronic pain syndrome; Lumbar radicular pain (Bilateral) (R>L); Encounter for therapeutic drug level monitoring; Long term current use of opiate analgesic; Long term prescription opiate use; Uncomplicated opioid dependence ([place]); Opiate use; Acid reflux; HLD (hyperlipidemia); Cannot sleep; Primary open angle glaucoma; Post menopausal syndrome; Temporary cerebral vascular dysfunction; Chronic low back pain (Primary Source of Pain) (Bilateral) (L>R); Grade 1 spondylolisthesis of L4 over L5; Severe (5-6 mm) L4-5 lumbar spinal stenosis and (8 mm) L3-4 spinal stenosis.; Lumbar discogenic pain syndrome; Lumbar facet syndrome (Bilateral) (L>R); Lumbar facet hypertrophy (Bilateral); Chronic sacroiliac joint pain (Left); Chronic hip pain (Left); Peptic ulcer disease; Morbid obesity ([place]); GERD (gastroesophageal reflux disease); Encounter for chronic pain management; Opiate analgesic contract exists ([facility]); Lumbar spondylosis; Abnormal MRI, lumbar spine (06/25/2016); Cervical spondylosis (Anterolisthesis of C5 over C6); Difficulty voiding; Neurogenic bladder; Chronic lower extremity pain (Secondary source of pain)  (Left); Chronic lumbar radicular pain (Left) (L5 Dermatome); Osteoarthritis of hip (Left); Chronic hip pain (Bilateral) (R>L); Chronic sacroiliac joint pain (Bilateral) (R>L); Kidney disease, chronic, stage III (GFR 30-59 ml/min) ([place]); Chronic shoulder pain (Left); Osteoarthritis of shoulder (Left); Chronic sacroiliac joint pain (Right); Chronic hip pain (Right); Left upper arm pain; Myofascial pain syndrome (left upper extremity); Pain in left acromioclavicular joint; and Osteoarthritis of left AC (acromioclavicular) joint on their problem list. Her primarily concern today is the Shoulder Pain (left)  Pain Assessment: Location: Left Shoulder Radiating: up shoulder to neck Onset: More than a month ago Duration: Chronic pain Quality: Aching, Sore, Throbbing Severity: 0-No pain(8 when moving it and doing things)/10 (self-reported pain score)  Note: Reported level is compatible with observation.                               Effect on ADL: lifting, adl's Modifying factors: essential oils  [name] was last seen on 07/21/2017 for a procedure. During today's appointment we reviewed Cynthia Bennett's post-procedure results, as well as her outpatient medication regimen.  Further details on both, my assessment(s), as well as the proposed treatment plan, please see below.  Controlled Substance Pharmacotherapy Assessment REMS (Risk Evaluation and Mitigation Strategy)  Analgesic: Oxycodone IR 5 mg PO q12hrs. MME/day: 15 mg/day.  Cynthia Specking, RN  10/21/2017  1:36 PM  Sign at close encounter Nursing Pain Medication Assessment:  Safety precautions to be maintained throughout the outpatient stay will include: orient to surroundings, keep bed in low position, maintain call bell within reach at all times, provide assistance with transfer out of bed and ambulation.  Medication Inspection Compliance: Pill count conducted under aseptic conditions, in front of the patient. Neither the pills nor the bottle was  removed from the patient's sight at any time. Once count was completed pills were  immediately returned to the patient in their original bottle.  Medication: Oxycodone IR Pill/Patch Count: 8 of 60 pills remain Pill/Patch Appearance: Markings consistent with prescribed medication Bottle Appearance: Standard pharmacy container. Clearly labeled. Filled Date: 09 / 27 / 2018 Last Medication intake:  Yesterday   Pharmacokinetics: Liberation and absorption (onset of action): WNL Distribution (time to peak effect): WNL Metabolism and excretion (duration of action): WNL         Pharmacodynamics: Desired effects: Analgesia: Cynthia Bennett reports >50% benefit. Functional ability: Patient reports that medication allows her to accomplish basic ADLs Clinically meaningful improvement in function (CMIF): Sustained CMIF goals met Perceived effectiveness: Described as relatively effective, allowing for increase in activities of daily living (ADL) Undesirable effects: Side-effects or Adverse reactions: None reported Monitoring: [place] PMP: Online review of the past 94-monthperiod conducted. Compliant with practice rules and regulations Last UDS on record: No results found for: SUMMARY UDS interpretation: Compliant          Medication Assessment Form: Reviewed. Patient indicates being compliant with therapy Treatment compliance: Compliant Risk Assessment Profile: Aberrant behavior: See prior evaluations. None observed or detected today Comorbid factors increasing risk of overdose: See prior notes. No additional risks detected today Risk of substance use disorder (SUD): Low Opioid Risk Tool - 10/21/17 1331      Family History of Substance Abuse   Alcohol  Negative    Illegal Drugs  Negative    Rx Drugs  Negative      Personal History of Substance Abuse   Alcohol  Negative    Illegal Drugs  Negative    Rx Drugs  Negative      Age   Age between 178-45years   No      Psychological Disease    Psychological Disease  Negative    Depression  Negative      Total Score   Opioid Risk Tool Scoring  0    Opioid Risk Interpretation  Low Risk      ORT Scoring interpretation table:  Score <3 = Low Risk for SUD  Score between 4-7 = Moderate Risk for SUD  Score >8 = High Risk for Opioid Abuse   Risk Mitigation Strategies:  Patient Counseling: Covered Patient-Prescriber Agreement (PPA): Present and active  Notification to other healthcare providers: Done  Pharmacologic Plan: No change in therapy, at this time  Post-Procedure Assessment  07/21/2017 Procedure: Diagnostic right-sided L4-5 interlaminar lumbar epidural steroid injection + left sided biceps muscle trigger point injection under fluoroscopic guidance, no sedation Pre-procedure pain score:  5/10 Post-procedure pain score: 9/10 Temporary increase in the patient's discomfort Influential Factors: BMI: 30.11 kg/m Intra-procedural challenges: None observed.         Assessment challenges: None detected.              Reported side-effects: Procedure-associated transient increase in pain. Post-procedural adverse reactions or complications: None reported         Sedation: No sedation used. When no sedatives are used, the analgesic levels obtained are directly associated to the effectiveness of the local anesthetics. However, when sedation is provided, the level of analgesia obtained during the initial 1 hour following the intervention, is believed to be the result of a combination of factors. These factors may include, but are not limited to: 1. The effectiveness of the local anesthetics used. 2. The effects of the analgesic(s) and/or anxiolytic(s) used. 3. The degree of discomfort experienced by the patient at the time of the procedure. 4. The patients  ability and reliability in recalling and recording the events. 5. The presence and influence of possible secondary gains and/or psychosocial factors. Reported result: Relief  experienced during the 1st hour after the procedure: 100 %(left shoulder injection) (Ultra-Short Term Relief)            Interpretative annotation: Clinically appropriate result. Analgesia during this period is likely to be Local Anesthetic and/or IV Sedative (Analgesic/Anxiolytic) related.          Effects of local anesthetic: The analgesic effects attained during this period are directly associated to the localized infiltration of local anesthetics and therefore cary significant diagnostic value as to the etiological location, or anatomical origin, of the pain. Expected duration of relief is directly dependent on the pharmacodynamics of the local anesthetic used. Long-acting (4-6 hours) anesthetics used.  Reported result: Relief during the next 4 to 6 hour after the procedure: 100 % (Short-Term Relief)            Interpretative annotation: Clinically appropriate result. Analgesia during this period is likely to be Local Anesthetic-related.          Long-term benefit: Defined as the period of time past the expected duration of local anesthetics (1 hour for short-acting and 4-6 hours for long-acting). With the possible exception of prolonged sympathetic blockade from the local anesthetics, benefits during this period are typically attributed to, or associated with, other factors such as analgesic sensory neuropraxia, antiinflammatory effects, or beneficial biochemical changes provided by agents other than the local anesthetics.  Reported result: Extended relief following procedure: 25 %(6 weeks) (Long-Term Relief)            Interpretative annotation: Clinically appropriate result. Partial relief. No permanent benefit expected. No significant inflammatory component detected. Etiology is likely mechanical rather than inflammatory.  Current benefits: Defined as reported results that persistent at this point in time.   Analgesia: 0-25 %            Function: Somewhat improved ROM: Somewhat  improved Interpretative annotation: Recurrence of symptoms. No permanent benefit expected. Results would suggest persistent aggravating factors.          Interpretation: Results would suggest therapy to have a limited impact on the patient's condition. We'll proceed with the next treatment, as soon as convenient          Plan:  Repeat treatment or therapy and compare extent and duration of benefits.        Laboratory Chemistry  Inflammation Markers (CRP: Acute Phase) (ESR: Chronic Phase) Lab Results  Component Value Date   CRP 0.6 06/24/2016   ESRSEDRATE 23 06/24/2016                 Rheumatology Markers No results found for: RF, ANA, [facility], [id number], [facility]              Renal Function Markers Lab Results  Component Value Date   BUN 13 06/24/2016   CREATININE 0.95 06/24/2016   GFRAA >60 06/24/2016   GFRNONAA 55 (L) 06/24/2016                 Hepatic Function Markers Lab Results  Component Value Date   AST 17 06/24/2016   ALT 11 (L) 06/24/2016   ALBUMIN 3.9 06/24/2016   ALKPHOS 70 06/24/2016   LIPASE 139 04/27/2014                 Electrolytes Lab Results  Component Value Date   NA 137 06/24/2016   K 4.3  06/24/2016   CL 107 06/24/2016   CALCIUM 9.1 06/24/2016   MG 2.2 06/24/2016                 Neuropathy Markers Lab Results  Component Value Date   VITAMINB12 240 06/24/2016                 Bone Pathology Markers Lab Results  Component Value Date   25OHVITD1 26 (L) 06/24/2016   25OHVITD2 5.6 06/24/2016   25OHVITD3 20 06/24/2016                 Coagulation Parameters Lab Results  Component Value Date   INR 0.96 06/24/2016   LABPROT 12.8 06/24/2016   PLT 291 06/24/2016                 Cardiovascular Markers Lab Results  Component Value Date   TROPONINI < 0.02 04/27/2014   HGB 12.7 06/24/2016   HCT 38.6 06/24/2016                 CA Markers No results found for: CEA, CA125, LABCA2               Note: Lab results  reviewed.  Recent Diagnostic Imaging Results  DG C-Arm 1-60 Min-No Report Fluoroscopy was utilized by the requesting physician.  No radiographic  interpretation.   Complexity Note: Imaging results reviewed. Results shared with Ms. Camus, using Layman's terms.                         Meds   Current Outpatient Medications:  .  acetaminophen (TYLENOL) 325 MG tablet, Take 650 mg by mouth every 6 (six) hours as needed., Disp: , Rfl:  .  buPROPion (WELLBUTRIN SR) 150 MG 12 hr tablet, TAKE 1 TABLET BY MOUTH ONCE DAILY, Disp: , Rfl:  .  dorzolamide-timolol (COSOPT) 22.3-6.8 MG/ML ophthalmic solution, INT 1 GTT IN OU BID, Disp: , Rfl: 5 .  doxepin (SINEQUAN) 100 MG capsule, Take 100 mg by mouth at bedtime., Disp: , Rfl:  .  meclizine (ANTIVERT) 12.5 MG tablet, Take 1 tablet by mouth daily as needed., Disp: , Rfl: 1 .  [START ON 12/20/2017] oxyCODONE (OXY IR/ROXICODONE) 5 MG immediate release tablet, Take 1 tablet (5 mg total) by mouth every 12 (twelve) hours as needed for severe pain., Disp: 60 tablet, Rfl: 0 .  [START ON 11/20/2017] oxyCODONE (OXY IR/ROXICODONE) 5 MG immediate release tablet, Take 1 tablet (5 mg total) by mouth every 12 (twelve) hours as needed for severe pain., Disp: 60 tablet, Rfl: 0 .  oxyCODONE (OXY IR/ROXICODONE) 5 MG immediate release tablet, Take 1 tablet (5 mg total) by mouth every 12 (twelve) hours as needed for severe pain., Disp: 60 tablet, Rfl: 0 .  pantoprazole (PROTONIX) 40 MG tablet, Take 1 tablet by mouth 2 (two) times daily., Disp: , Rfl: 11 .  sertraline (ZOLOFT) 100 MG tablet, Take 1 tablet by mouth every morning., Disp: , Rfl:  .  zolpidem (AMBIEN) 10 MG tablet, TAKE 1/2 TO 1 TABLET BY MOUTH EVERY NIGHT AT BEDTIME, Disp: , Rfl:   ROS  Constitutional: Denies any fever or chills Gastrointestinal: No reported hemesis, hematochezia, vomiting, or acute GI distress Musculoskeletal: Denies any acute onset joint swelling, redness, loss of ROM, or weakness Neurological:  No reported episodes of acute onset apraxia, aphasia, dysarthria, agnosia, amnesia, paralysis, loss of coordination, or loss of consciousness  Allergies  Ms. Schuessler is allergic to ibuprofen  and nsaids.  PFSH  Drug: Ms. Ybanez  reports that she does not use drugs. Alcohol:  reports that she does not drink alcohol. Tobacco:  reports that she has quit smoking. she has never used smokeless tobacco. Medical:  has a past medical history of Dizzy spells, Fibromyalgia, Glaucoma, Hiatal hernia, Osteoarthritis, Osteoarthritis of spine with radiculopathy, lumbar region (08/14/2015), Peptic ulcer disease, Reflux, Spinal stenosis, Stroke ([place]), and TIA (transient ischemic attack). Surgical: Ms. Loyola  has a past surgical history that includes Esophagogastroduodenoscopy (N/A, 04/16/2015); Appendectomy; and Abdominal hysterectomy. Family: family history includes COPD in her father; Mental illness in her mother.  Constitutional Exam  General appearance: Well nourished, well developed, and well hydrated. In no apparent acute distress Vitals:   10/21/17 1317  BP: 102/70  Pulse: 76  Resp: 16  Temp: (!) 97.5 F (36.4 C)  SpO2: 99%  Weight: 170 lb (77.1 kg)  Height: 5' 3"  (1.6 m)   BMI Assessment: Estimated body mass index is 30.11 kg/m as calculated from the following:   Height as of this encounter: 5' 3"  (1.6 m).   Weight as of this encounter: 170 lb (77.1 kg).  BMI interpretation table: BMI level Category Range association with higher incidence of chronic pain  <18 kg/m2 Underweight   18.5-24.9 kg/m2 Ideal body weight   25-29.9 kg/m2 Overweight Increased incidence by 20%  30-34.9 kg/m2 Obese (Class I) Increased incidence by 68%  35-39.9 kg/m2 Severe obesity (Class II) Increased incidence by 136%  >40 kg/m2 Extreme obesity (Class III) Increased incidence by 254%   BMI Readings from Last 4 Encounters:  10/21/17 30.11 kg/m  07/21/17 30.11 kg/m  05/28/17 32.01 kg/m  04/29/17 32.01 kg/m   Wt  Readings from Last 4 Encounters:  10/21/17 170 lb (77.1 kg)  07/21/17 170 lb (77.1 kg)  05/28/17 175 lb (79.4 kg)  04/29/17 175 lb (79.4 kg)  Psych/Mental status: Alert, oriented x 3 (person, place, & time)       Eyes: PERLA Respiratory: No evidence of acute respiratory distress  Cervical Spine Area Exam  Skin & Axial Inspection: No masses, redness, edema, swelling, or associated skin lesions Alignment: Symmetrical Functional ROM: Unrestricted ROM      Stability: No instability detected Muscle Tone/Strength: Functionally intact. No obvious neuro-muscular anomalies detected. Sensory (Neurological): Unimpaired Palpation: No palpable anomalies              Upper Extremity (UE) Exam    Side: Right upper extremity  Side: Left upper extremity  Skin & Extremity Inspection: Skin color, temperature, and hair growth are WNL. No peripheral edema or cyanosis. No masses, redness, swelling, asymmetry, or associated skin lesions. No contractures.  Skin & Extremity Inspection: Skin color, temperature, and hair growth are WNL. No peripheral edema or cyanosis. No masses, redness, swelling, asymmetry, or associated skin lesions. No contractures.  Functional ROM: Unrestricted ROM          Functional ROM: Unrestricted ROM          Muscle Tone/Strength: Functionally intact. No obvious neuro-muscular anomalies detected.  Muscle Tone/Strength: Functionally intact. No obvious neuro-muscular anomalies detected.  Sensory (Neurological): Unimpaired          Sensory (Neurological): Unimpaired          Palpation: No palpable anomalies              Palpation: No palpable anomalies              Specialized Test(s): Deferred  Specialized Test(s): Deferred          Thoracic Spine Area Exam  Skin & Axial Inspection: No masses, redness, or swelling Alignment: Symmetrical Functional ROM: Unrestricted ROM Stability: No instability detected Muscle Tone/Strength: Functionally intact. No obvious neuro-muscular  anomalies detected. Sensory (Neurological): Unimpaired Muscle strength & Tone: No palpable anomalies  Lumbar Spine Area Exam  Skin & Axial Inspection: No masses, redness, or swelling Alignment: Symmetrical Functional ROM: Decreased ROM      Stability: No instability detected Muscle Tone/Strength: Functionally intact. No obvious neuro-muscular anomalies detected. Sensory (Neurological): Movement-associated discomfort Palpation: Uncomfortable       Provocative Tests: Lumbar Hyperextension and rotation test: evaluation deferred today       Lumbar Lateral bending test: evaluation deferred today       Patrick's Maneuver: evaluation deferred today                    Gait & Posture Assessment  Ambulation: Unassisted Gait: Relatively normal for age and body habitus Posture: WNL   Lower Extremity Exam    Side: Right lower extremity  Side: Left lower extremity  Skin & Extremity Inspection: Skin color, temperature, and hair growth are WNL. No peripheral edema or cyanosis. No masses, redness, swelling, asymmetry, or associated skin lesions. No contractures.  Skin & Extremity Inspection: Skin color, temperature, and hair growth are WNL. No peripheral edema or cyanosis. No masses, redness, swelling, asymmetry, or associated skin lesions. No contractures.  Functional ROM: Unrestricted ROM          Functional ROM: Unrestricted ROM          Muscle Tone/Strength: Functionally intact. No obvious neuro-muscular anomalies detected.  Muscle Tone/Strength: Functionally intact. No obvious neuro-muscular anomalies detected.  Sensory (Neurological): Unimpaired  Sensory (Neurological): Unimpaired  Palpation: No palpable anomalies  Palpation: No palpable anomalies   Assessment  Primary Diagnosis & Pertinent Problem List: The primary encounter diagnosis was Chronic low back pain (Primary Source of Pain) (Bilateral) (L>R). Diagnoses of Chronic lower extremity pain (Secondary source of pain) (Left), Myofascial pain  syndrome (left upper extremity), Chronic shoulder pain (Left), Osteoarthritis of left AC (acromioclavicular) joint, Pain in left acromioclavicular joint, and Chronic pain syndrome were also pertinent to this visit.  Status Diagnosis  Persistent Persistent Persistent 1. Chronic low back pain (Primary Source of Pain) (Bilateral) (L>R)   2. Chronic lower extremity pain (Secondary source of pain) (Left)   3. Myofascial pain syndrome (left upper extremity)   4. Chronic shoulder pain (Left)   5. Osteoarthritis of left AC (acromioclavicular) joint   6. Pain in left acromioclavicular joint   7. Chronic pain syndrome     Problems updated and reviewed during this visit: No problems updated. Plan of Care  Pharmacotherapy (Medications Ordered): Meds ordered this encounter  Medications  . oxyCODONE (OXY IR/ROXICODONE) 5 MG immediate release tablet    Sig: Take 1 tablet (5 mg total) by mouth every 12 (twelve) hours as needed for severe pain.    Dispense:  60 tablet    Refill:  0    Fill one day early if pharmacy is closed on scheduled refill date. Do not fill until: 12/20/17 To last until: 01/19/18  . oxyCODONE (OXY IR/ROXICODONE) 5 MG immediate release tablet    Sig: Take 1 tablet (5 mg total) by mouth every 12 (twelve) hours as needed for severe pain.    Dispense:  60 tablet    Refill:  0    Fill one day early if  pharmacy is closed on scheduled refill date. Do not fill until: 11/20/17 To last until: 12/20/17  . oxyCODONE (OXY IR/ROXICODONE) 5 MG immediate release tablet    Sig: Take 1 tablet (5 mg total) by mouth every 12 (twelve) hours as needed for severe pain.    Dispense:  60 tablet    Refill:  0    Fill one day early if pharmacy is closed on scheduled refill date. Do not fill until: 10/21/17 To last until: 11/20/17  This SmartLink is deprecated. Use AVSMEDLIST instead to display the medication list for a patient. Medications administered today: Letta Median B. Christopherson had no medications  administered during this visit.   Procedure Orders     SHOULDER INJECTION     TRIGGER POINT INJECTION Lab Orders  No laboratory test(s) ordered today    Imaging Orders     MR SHOULDER LEFT WO CONTRAST Referral Orders  No referral(s) requested today    Interventional management options: Planned, scheduled, and/or pending:   Diagnostic left AC joint injection + MNB (L) Biceps muscle.   Considering:   Diagnostic bilateral lumbar facet block  Possible bilateral lumbar facet RFA  Diagnostic bilateral sacroiliac joint block  Possible bilateral sacroiliac joint RFA  Diagnostic left L4-5 transforaminal epidural steroid injection Palliative left L4-5 interlaminar epidural steroid injection Diagnostic bilateral intra-articular hip joint injection  Diagnostic bilateral femoral nerve + obturator nerve block  Possible bilateral femoral nerve + obturator nerve RFA    Palliative PRN treatment(s):   Palliative left-sided L4-5 Interlaminar lumbar epidural steroid injection  Palliative L5-S1 interlaminar lumbar epiduralsteroid injection  Palliative left intra-articular shoulder joint injection  Palliative right sacroiliac joint block    Provider-requested follow-up: Return for Procedure (no sedation): (L) AC Joint injection + MNB (L) Biceps.  Future Appointments  Date Time Provider [place]  01/18/2018  9:15 AM Vevelyn Francois, NP [facility] None   Primary Care Physician: [name], MD Location: [facility] Note by: Gaspar Cola, MD Date: 10/21/2017; Time: 2:18 PM

## 2017-11-12 ENCOUNTER — Ambulatory Visit
Admission: RE | Admit: 2017-11-12 | Discharge: 2017-11-12 | Disposition: A | Discharge status: Home / Self Care | Payer: PPO | Source: Ambulatory Visit | Attending: Pain Medicine | Admitting: Pain Medicine

## 2017-11-12 DIAGNOSIS — M75122 Complete rotator cuff tear or rupture of left shoulder, not specified as traumatic: Secondary | ICD-10-CM | POA: Diagnosis not present

## 2017-11-12 DIAGNOSIS — G8929 Other chronic pain: Secondary | ICD-10-CM | POA: Diagnosis not present

## 2017-11-12 DIAGNOSIS — R600 Localized edema: Secondary | ICD-10-CM | POA: Insufficient documentation

## 2017-11-12 DIAGNOSIS — M19012 Primary osteoarthritis, left shoulder: Secondary | ICD-10-CM | POA: Insufficient documentation

## 2017-11-12 DIAGNOSIS — X58XXXA Exposure to other specified factors, initial encounter: Secondary | ICD-10-CM | POA: Insufficient documentation

## 2017-11-12 DIAGNOSIS — M25512 Pain in left shoulder: Secondary | ICD-10-CM | POA: Diagnosis not present

## 2017-11-12 DIAGNOSIS — M25412 Effusion, left shoulder: Secondary | ICD-10-CM | POA: Insufficient documentation

## 2017-11-12 DIAGNOSIS — S46812A Strain of other muscles, fascia and tendons at shoulder and upper arm level, left arm, initial encounter: Secondary | ICD-10-CM | POA: Diagnosis not present

## 2017-11-15 ENCOUNTER — Other Ambulatory Visit: Payer: Self-pay | Admitting: Pain Medicine

## 2017-11-15 DIAGNOSIS — G8929 Other chronic pain: Secondary | ICD-10-CM

## 2017-11-15 DIAGNOSIS — S46812S Strain of other muscles, fascia and tendons at shoulder and upper arm level, left arm, sequela: Principal | ICD-10-CM

## 2017-11-15 DIAGNOSIS — M19012 Primary osteoarthritis, left shoulder: Secondary | ICD-10-CM

## 2017-11-15 DIAGNOSIS — M25512 Pain in left shoulder: Secondary | ICD-10-CM

## 2017-11-15 DIAGNOSIS — IMO0001 Reserved for inherently not codable concepts without codable children: Secondary | ICD-10-CM | POA: Insufficient documentation

## 2017-11-15 NOTE — Progress Notes (Signed)
Results were reviewed and found to be: significantly abnormal  Surgical consultation is recommended  Review would suggest interventional pain management techniques may be of benefit 

## 2017-12-10 DIAGNOSIS — M25512 Pain in left shoulder: Secondary | ICD-10-CM | POA: Diagnosis not present

## 2017-12-16 DIAGNOSIS — M67912 Unspecified disorder of synovium and tendon, left shoulder: Secondary | ICD-10-CM | POA: Diagnosis not present

## 2017-12-16 DIAGNOSIS — M67922 Unspecified disorder of synovium and tendon, left upper arm: Secondary | ICD-10-CM | POA: Diagnosis not present

## 2017-12-16 DIAGNOSIS — M75112 Incomplete rotator cuff tear or rupture of left shoulder, not specified as traumatic: Secondary | ICD-10-CM | POA: Diagnosis not present

## 2017-12-16 DIAGNOSIS — M19012 Primary osteoarthritis, left shoulder: Secondary | ICD-10-CM | POA: Diagnosis not present

## 2017-12-24 DIAGNOSIS — G8929 Other chronic pain: Secondary | ICD-10-CM | POA: Diagnosis not present

## 2017-12-24 DIAGNOSIS — M25612 Stiffness of left shoulder, not elsewhere classified: Secondary | ICD-10-CM | POA: Diagnosis not present

## 2017-12-24 DIAGNOSIS — M6281 Muscle weakness (generalized): Secondary | ICD-10-CM | POA: Diagnosis not present

## 2017-12-24 DIAGNOSIS — M19012 Primary osteoarthritis, left shoulder: Secondary | ICD-10-CM | POA: Diagnosis not present

## 2017-12-24 DIAGNOSIS — M25512 Pain in left shoulder: Secondary | ICD-10-CM | POA: Diagnosis not present

## 2017-12-31 DIAGNOSIS — G8929 Other chronic pain: Secondary | ICD-10-CM | POA: Diagnosis not present

## 2017-12-31 DIAGNOSIS — M25512 Pain in left shoulder: Secondary | ICD-10-CM | POA: Diagnosis not present

## 2018-01-05 DIAGNOSIS — G8929 Other chronic pain: Secondary | ICD-10-CM | POA: Diagnosis not present

## 2018-01-05 DIAGNOSIS — M25512 Pain in left shoulder: Secondary | ICD-10-CM | POA: Diagnosis not present

## 2018-01-18 ENCOUNTER — Encounter: Payer: PPO | Admitting: Nurse Practitioner

## 2018-02-02 ENCOUNTER — Encounter: Payer: Self-pay | Admitting: Nurse Practitioner

## 2018-02-02 ENCOUNTER — Ambulatory Visit: Payer: PPO | Attending: Nurse Practitioner | Admitting: Nurse Practitioner

## 2018-02-02 ENCOUNTER — Other Ambulatory Visit: Payer: Self-pay | Admitting: Nurse Practitioner

## 2018-02-02 VITALS — BP 155/76 | HR 99 | Temp 98.7°F | Resp 16 | Ht 63.0 in | Wt 170.0 lb

## 2018-02-02 DIAGNOSIS — G894 Chronic pain syndrome: Secondary | ICD-10-CM | POA: Insufficient documentation

## 2018-02-02 DIAGNOSIS — M79622 Pain in left upper arm: Secondary | ICD-10-CM | POA: Insufficient documentation

## 2018-02-02 DIAGNOSIS — R200 Anesthesia of skin: Secondary | ICD-10-CM | POA: Diagnosis not present

## 2018-02-02 DIAGNOSIS — M48061 Spinal stenosis, lumbar region without neurogenic claudication: Secondary | ICD-10-CM | POA: Insufficient documentation

## 2018-02-02 DIAGNOSIS — Z79891 Long term (current) use of opiate analgesic: Secondary | ICD-10-CM | POA: Insufficient documentation

## 2018-02-02 DIAGNOSIS — Z79899 Other long term (current) drug therapy: Secondary | ICD-10-CM | POA: Diagnosis not present

## 2018-02-02 DIAGNOSIS — E785 Hyperlipidemia, unspecified: Secondary | ICD-10-CM | POA: Insufficient documentation

## 2018-02-02 DIAGNOSIS — N183 Chronic kidney disease, stage 3 (moderate): Secondary | ICD-10-CM | POA: Insufficient documentation

## 2018-02-02 DIAGNOSIS — M25512 Pain in left shoulder: Secondary | ICD-10-CM | POA: Diagnosis not present

## 2018-02-02 DIAGNOSIS — M1610 Unilateral primary osteoarthritis, unspecified hip: Secondary | ICD-10-CM | POA: Insufficient documentation

## 2018-02-02 DIAGNOSIS — Z8673 Personal history of transient ischemic attack (TIA), and cerebral infarction without residual deficits: Secondary | ICD-10-CM | POA: Insufficient documentation

## 2018-02-02 DIAGNOSIS — Z886 Allergy status to analgesic agent status: Secondary | ICD-10-CM | POA: Insufficient documentation

## 2018-02-02 DIAGNOSIS — Z818 Family history of other mental and behavioral disorders: Secondary | ICD-10-CM | POA: Insufficient documentation

## 2018-02-02 DIAGNOSIS — K449 Diaphragmatic hernia without obstruction or gangrene: Secondary | ICD-10-CM | POA: Diagnosis not present

## 2018-02-02 DIAGNOSIS — M533 Sacrococcygeal disorders, not elsewhere classified: Secondary | ICD-10-CM | POA: Insufficient documentation

## 2018-02-02 DIAGNOSIS — G8929 Other chronic pain: Secondary | ICD-10-CM | POA: Diagnosis not present

## 2018-02-02 DIAGNOSIS — K219 Gastro-esophageal reflux disease without esophagitis: Secondary | ICD-10-CM | POA: Diagnosis not present

## 2018-02-02 DIAGNOSIS — Z90710 Acquired absence of both cervix and uterus: Secondary | ICD-10-CM | POA: Diagnosis not present

## 2018-02-02 DIAGNOSIS — M797 Fibromyalgia: Secondary | ICD-10-CM | POA: Diagnosis not present

## 2018-02-02 DIAGNOSIS — Z825 Family history of asthma and other chronic lower respiratory diseases: Secondary | ICD-10-CM | POA: Insufficient documentation

## 2018-02-02 DIAGNOSIS — M5442 Lumbago with sciatica, left side: Secondary | ICD-10-CM | POA: Insufficient documentation

## 2018-02-02 DIAGNOSIS — Z9889 Other specified postprocedural states: Secondary | ICD-10-CM | POA: Insufficient documentation

## 2018-02-02 DIAGNOSIS — Z720 Tobacco use: Secondary | ICD-10-CM | POA: Diagnosis not present

## 2018-02-02 DIAGNOSIS — F329 Major depressive disorder, single episode, unspecified: Secondary | ICD-10-CM | POA: Diagnosis not present

## 2018-02-02 DIAGNOSIS — M47816 Spondylosis without myelopathy or radiculopathy, lumbar region: Secondary | ICD-10-CM | POA: Insufficient documentation

## 2018-02-02 MED ORDER — OXYCODONE HCL 5 MG PO TABS: 5.0000 mg | ORAL_TABLET | Freq: Two times a day (BID) | ORAL | 0 refills | Status: DC | PRN

## 2018-02-02 NOTE — Progress Notes (Signed)
Nursing Pain Medication Assessment:  Safety precautions to be maintained throughout the outpatient stay will include: orient to surroundings, keep bed in low position, maintain call bell within reach at all times, provide assistance with transfer out of bed and ambulation.  Medication Inspection Compliance: Pill count conducted under aseptic conditions, in front of the patient. Neither the pills nor the bottle was removed from the patient's sight at any time. Once count was completed pills were immediately returned to the patient in their original bottle.  Medication: Oxycodone IR Pill/Patch Count: 18 of 60 pills remain Pill/Patch Appearance: Markings consistent with prescribed medication Bottle Appearance: Standard pharmacy container. Clearly labeled. Filled Date: 03 / 09 / 2019 Last Medication intake:  Today   Patient reports she has had to take extra tablets while taking PT for shoulder pain.

## 2018-02-02 NOTE — Progress Notes (Signed)
Patient's Name: Cynthia Bennett  MRN: [id number]  Referring Provider: [name], MD  DOB: [birth year]  PCP: [name], MD  DOS: 02/02/2018  Note by: Vevelyn Francois NP  Service setting: Ambulatory outpatient  Specialty: Interventional Pain Management  Location: [facility] (AMB) Pain Management Facility    Patient type: Established    Primary Reason(s) for Visit: Encounter for prescription drug management. (Level of risk: moderate)  CC: Back Pain (lower bilateral) and Shoulder Pain (left)  HPI  Cynthia Bennett is a 84 y.o. year old, female patient, who comes today for a medication management evaluation. She has Fibromyalgia; Adiposity; Current tobacco use; Bilateral low back pain with left-sided sciatica; Chronic pain syndrome; Lumbar radicular pain (Bilateral) (R>L); Encounter for therapeutic drug level monitoring; Long term current use of opiate analgesic; Long term prescription opiate use; Uncomplicated opioid dependence ([place]); Opiate use; Acid reflux; Hyperlipidemia, unspecified; Cannot sleep; Primary open angle glaucoma; Post menopausal syndrome; Temporary cerebral vascular dysfunction; Chronic low back pain (Primary Source of Pain) (Bilateral) (L>R); Grade 1 spondylolisthesis of L4 over L5; Severe (5-6 mm) L4-5 lumbar spinal stenosis and (8 mm) L3-4 spinal stenosis.; Lumbar discogenic pain syndrome; Lumbar facet syndrome (Bilateral) (L>R); Lumbar facet hypertrophy (Bilateral); Chronic sacroiliac joint pain (Left); Chronic hip pain (Left); Peptic ulcer disease; Morbid obesity ([place]); GERD (gastroesophageal reflux disease); Encounter for chronic pain management; Opiate analgesic contract exists ([facility]); Lumbar spondylosis; Abnormal MRI, lumbar spine (06/25/2016); Cervical spondylosis (Anterolisthesis of C5 over C6); Difficulty voiding; Neurogenic bladder; Chronic lower extremity pain (Secondary source of pain) (Left); Chronic lumbar radicular pain (Left) (L5 Dermatome);  Osteoarthritis of hip (Left); Chronic hip pain (Bilateral) (R>L); Chronic sacroiliac joint pain (Bilateral) (R>L); Kidney disease, chronic, stage III (GFR 30-59 ml/min) ([place]); Chronic shoulder pain (Left); Osteoarthritis of shoulder (Left); Chronic sacroiliac joint pain (Right); Chronic hip pain (Right); Left upper arm pain; Myofascial pain syndrome (left upper extremity); Pain in left acromioclavicular joint; Osteoarthritis of left AC (acromioclavicular) joint; and Supraspinatus tendon tear, left, sequela on their problem list. Her primarily concern today is the Back Pain (lower bilateral) and Shoulder Pain (left)  Pain Assessment: Location: Lower, Left, Right(left) Back(shoulder) Radiating: down the left leg causing numbness in her foot.  shoulder goes into the deltoid Onset: More than a month ago Duration: Chronic pain Quality: Discomfort Severity: 5 /10 (self-reported pain score)  Note: Reported level is compatible with observation.                          Effect on ADL: the less she does the better the pain is.  Babies the shoulder so that it won't flare. very affected by the weather grandaughter comes and helps with housework Timing: Intermittent Modifying factors: pain medications, procedures.  PT   Cynthia Bennett was last scheduled for an appointment on 01/18/2018 for medication management. During today's appointment we reviewed [name]'s chronic pain status, as well as her outpatient medication regimen. She admits that her leg and back pain is better than it use to be. She admits that she will continue with PT for her left shoulder. She does not want to reschedule her injection at this time. She states that she is not interested in any surgery for her shoulder. She has does have some constipation but she is able to use dietary adjustment which is effective for this. She admits that she stays on top of this. She denies any new concerns at this time.   The  patient  reports that she does not use  drugs. Her body mass index is 30.11 kg/m.  Further details on both, my assessment(s), as well as the proposed treatment plan, please see below.  Controlled Substance Pharmacotherapy Assessment REMS (Risk Evaluation and Mitigation Strategy)  Analgesic: Oxycodone IR 5 mg PO q12hrs. MME/day: 15 mg/day.    [name], RN  02/02/2018  9:41 AM  Sign at close encounter Nursing Pain Medication Assessment:  Safety precautions to be maintained throughout the outpatient stay will include: orient to surroundings, keep bed in low position, maintain call bell within reach at all times, provide assistance with transfer out of bed and ambulation.  Medication Inspection Compliance: Pill count conducted under aseptic conditions, in front of the patient. Neither the pills nor the bottle was removed from the patient's sight at any time. Once count was completed pills were immediately returned to the patient in their original bottle.  Medication: Oxycodone IR Pill/Patch Count: 18 of 60 pills remain Pill/Patch Appearance: Markings consistent with prescribed medication Bottle Appearance: Standard pharmacy container. Clearly labeled. Filled Date: 03 / 09 / 2019 Last Medication intake:  Today   Patient reports she has had to take extra tablets while taking PT for shoulder pain.    Pharmacokinetics: Liberation and absorption (onset of action): WNL Distribution (time to peak effect): WNL Metabolism and excretion (duration of action): WNL         Pharmacodynamics: Desired effects: Analgesia: Cynthia Bennett reports >50% benefit. Functional ability: Patient reports that medication allows her to accomplish basic ADLs Clinically meaningful improvement in function (CMIF): Sustained CMIF goals met Perceived effectiveness: Described as relatively effective, allowing for increase in activities of daily living (ADL) Undesirable effects: Side-effects or Adverse reactions: None reported Monitoring: [place] PMP: Online  review of the past 33-monthperiod conducted. Compliant with practice rules and regulations Last UDS on record: No results found for: SUMMARY UDS interpretation: Compliant          Medication Assessment Form: Reviewed. Patient indicates being compliant with therapy Treatment compliance: Compliant Risk Assessment Profile: Aberrant behavior: See prior evaluations. None observed or detected today Comorbid factors increasing risk of overdose: See prior notes. No additional risks detected today Risk of substance use disorder (SUD): Low Opioid Risk Tool - 02/02/18 0937      Family History of Substance Abuse   Alcohol  Negative    Illegal Drugs  Negative    Rx Drugs  Negative      Personal History of Substance Abuse   Alcohol  Negative    Illegal Drugs  Negative    Rx Drugs  Negative      Psychological Disease   Psychological Disease  Positive    ADD  Negative    OCD  Negative    Bipolar  Negative    Schizophrenia  Negative    Depression  Positive takes zoloft and wellbutrin reports working well   takes zoloft and wellbutrin reports working well     Total Score   Opioid Risk Tool Scoring  3    Opioid Risk Interpretation  Low Risk      ORT Scoring interpretation table:  Score <3 = Low Risk for SUD  Score between 4-7 = Moderate Risk for SUD  Score >8 = High Risk for Opioid Abuse   Risk Mitigation Strategies:  Patient Counseling: Covered Patient-Prescriber Agreement (PPA): Present and active  Notification to other healthcare providers: Done  Pharmacologic Plan: No change in therapy, at this time.  Laboratory Chemistry  Inflammation Markers (CRP: Acute Phase) (ESR: Chronic Phase) Lab Results  Component Value Date   CRP 0.6 06/24/2016   ESRSEDRATE 23 06/24/2016                         Rheumatology Markers No results found for: RF, ANA, [facility], LYMEIGGIGMAB, [facility]                      Renal Function Markers Lab Results  Component Value Date    BUN 13 06/24/2016   CREATININE 0.95 06/24/2016   GFRAA >60 06/24/2016   GFRNONAA 55 (L) 06/24/2016                              Hepatic Function Markers Lab Results  Component Value Date   AST 17 06/24/2016   ALT 11 (L) 06/24/2016   ALBUMIN 3.9 06/24/2016   ALKPHOS 70 06/24/2016   LIPASE 139 04/27/2014                        Electrolytes Lab Results  Component Value Date   NA 137 06/24/2016   K 4.3 06/24/2016   CL 107 06/24/2016   CALCIUM 9.1 06/24/2016   MG 2.2 06/24/2016                        Neuropathy Markers Lab Results  Component Value Date   VITAMINB12 240 06/24/2016                        Bone Pathology Markers Lab Results  Component Value Date   25OHVITD1 26 (L) 06/24/2016   25OHVITD2 5.6 06/24/2016   25OHVITD3 20 06/24/2016                         Coagulation Parameters Lab Results  Component Value Date   INR 0.96 06/24/2016   LABPROT 12.8 06/24/2016   PLT 291 06/24/2016                        Cardiovascular Markers Lab Results  Component Value Date   TROPONINI < 0.02 04/27/2014   HGB 12.7 06/24/2016   HCT 38.6 06/24/2016                         CA Markers No results found for: CEA, CA125, LABCA2                      Note: Lab results reviewed.  Recent Diagnostic Imaging Results  MR SHOULDER LEFT WO CONTRAST CLINICAL DATA:  Chronic left shoulder pain  EXAM: MRI OF THE LEFT SHOULDER WITHOUT CONTRAST  TECHNIQUE: Multiplanar, multisequence MR imaging of the shoulder was performed. No intravenous contrast was administered.  COMPARISON:  03/31/2017  FINDINGS: Rotator cuff: Full-thickness partial width tear of the anterior leading edge of the supraspinatus tendon, images 13 through 15 series 7. Partial thickness articular surface insertional tearing of the adjacent central portion of the supraspinatus tendon.  Articular surface partial thickness tearing of the subscapularis. Moderate supraspinatus and subscapularis tendinopathy  with mild infraspinatus tendinopathy.  Muscles:  Unremarkable  Biceps long head: Moderate tendinopathy or partial tearing of the intra-articular segment.  Acromioclavicular Joint: Moderate spurring with a edema in  the joint and adjacent marrow edema. Type II acromion. As expected there is considerable fluid in the subacromial subdeltoid bursa.  Glenohumeral Joint: Small glenohumeral joint effusion. Moderate degenerative chondral thinning. Mild spurring of the humeral head.  Labrum: Slightly blunted/irregular posterosuperior labrum on image 10/4, favoring labral degeneration. Appearance is equivocal for superior labral tear.  Bones: No significant extra-articular osseous abnormalities identified.  Other: No supplemental non-categorized findings.  IMPRESSION: 1. Full-thickness partial width tear of the anterior leading edge of the supraspinatus tendon with adjacent moderate supraspinatus tendinopathy and adjacent partial thickness articular surface insertional tearing. 2. Partial thickness articular surface tearing of the subscapularis with moderate subscapularis tendinopathy. Mild infraspinatus tendinopathy. 3. Moderate biceps tendinopathy. 4. Moderate degenerative AC joint arthropathy with mild to moderate degenerative glenohumeral arthropathy. 5. Blunted/irregular posterosuperior labrum compatible with degeneration or conceivably mild tearing.  Electronically Signed   By: Van Clines M.D.   On: 11/12/2017 08:58  Complexity Note: Imaging results reviewed. Results shared with Ms. Avery, using Layman's terms.                         Meds   Current Outpatient Medications:  .  acetaminophen (TYLENOL) 325 MG tablet, Take 650 mg by mouth every 6 (six) hours as needed., Disp: , Rfl:  .  buPROPion (WELLBUTRIN SR) 150 MG 12 hr tablet, TAKE 1 TABLET BY MOUTH ONCE DAILY, Disp: , Rfl:  .  dorzolamide-timolol (COSOPT) 22.3-6.8 MG/ML ophthalmic solution, INT 1 GTT IN OU BID,  Disp: , Rfl: 5 .  doxepin (SINEQUAN) 100 MG capsule, Take 100 mg by mouth at bedtime., Disp: , Rfl:  .  meclizine (ANTIVERT) 12.5 MG tablet, Take 1 tablet by mouth daily as needed., Disp: , Rfl: 1 .  pantoprazole (PROTONIX) 40 MG tablet, Take 1 tablet by mouth 2 (two) times daily., Disp: , Rfl: 11 .  sertraline (ZOLOFT) 100 MG tablet, Take 1 tablet by mouth every morning., Disp: , Rfl:  .  zolpidem (AMBIEN) 10 MG tablet, TAKE 1/2 TO 1 TABLET BY MOUTH EVERY NIGHT AT BEDTIME, Disp: , Rfl:  .  Multiple Vitamin (MULTIVITAMIN) tablet, Take 1 tablet by mouth daily., Disp: , Rfl:  .  [START ON 04/16/2018] oxyCODONE (OXY IR/ROXICODONE) 5 MG immediate release tablet, Take 1 tablet (5 mg total) by mouth every 12 (twelve) hours as needed for severe pain., Disp: 60 tablet, Rfl: 0 .  [START ON 03/17/2018] oxyCODONE (OXY IR/ROXICODONE) 5 MG immediate release tablet, Take 1 tablet (5 mg total) by mouth every 12 (twelve) hours as needed for severe pain., Disp: 60 tablet, Rfl: 0 .  [START ON 02/15/2018] oxyCODONE (OXY IR/ROXICODONE) 5 MG immediate release tablet, Take 1 tablet (5 mg total) by mouth every 12 (twelve) hours as needed for severe pain., Disp: 60 tablet, Rfl: 0  ROS  Constitutional: Denies any fever or chills Gastrointestinal: No reported hemesis, hematochezia, vomiting, or acute GI distress Musculoskeletal: Denies any acute onset joint swelling, redness, loss of ROM, or weakness Neurological: No reported episodes of acute onset apraxia, aphasia, dysarthria, agnosia, amnesia, paralysis, loss of coordination, or loss of consciousness  Allergies  Ms. Campoverde is allergic to ibuprofen and nsaids.  PFSH  Drug: Ms. Jarrard  reports that she does not use drugs. Alcohol:  reports that she does not drink alcohol. Tobacco:  reports that she has quit smoking. She has never used smokeless tobacco. Medical:  has a past medical history of Dizzy spells, Fibromyalgia, Glaucoma, Hiatal hernia, Osteoarthritis, Osteoarthritis  of spine with radiculopathy, lumbar region (08/14/2015), Peptic ulcer disease, Reflux, Spinal stenosis, Stroke ([place]), and TIA (transient ischemic attack). Surgical: Ms. Ronning  has a past surgical history that includes Esophagogastroduodenoscopy (N/A, 04/16/2015); Appendectomy; and Abdominal hysterectomy. Family: family history includes COPD in her father; Mental illness in her mother.  Constitutional Exam  General appearance: Well nourished, well developed, and well hydrated. In no apparent acute distress Vitals:   02/02/18 0928  BP: (!) 155/76  Pulse: 99  Resp: 16  Temp: 98.7 F (37.1 C)  TempSrc: Oral  SpO2: 99%  Weight: 170 lb (77.1 kg)  Height: 5' 3"  (1.6 m)   BMI Assessment: Estimated body mass index is 30.11 kg/m as calculated from the following:   Height as of this encounter: 5' 3"  (1.6 m).   Weight as of this encounter: 170 lb (77.1 kg). Psych/Mental status: Alert, oriented x 3 (person, place, & time)       Eyes: PERLA Respiratory: No evidence of acute respiratory distress  Cervical Spine Area Exam  Skin & Axial Inspection: No masses, redness, edema, swelling, or associated skin lesions Alignment: Symmetrical Functional ROM: Unrestricted ROM      Stability: No instability detected Muscle Tone/Strength: Functionally intact. No obvious neuro-muscular anomalies detected. Sensory (Neurological): Unimpaired Palpation: No palpable anomalies              Upper Extremity (UE) Exam    Side: Right upper extremity  Side: Left upper extremity  Skin & Extremity Inspection: Skin color, temperature, and hair growth are WNL. No peripheral edema or cyanosis. No masses, redness, swelling, asymmetry, or associated skin lesions. No contractures.  Skin & Extremity Inspection: Skin color, temperature, and hair growth are WNL. No peripheral edema or cyanosis. No masses, redness, swelling, asymmetry, or associated skin lesions. No contractures.  Functional ROM: Unrestricted ROM          Functional  ROM: Decreased ROM for shoulder  Muscle Tone/Strength: Functionally intact. No obvious neuro-muscular anomalies detected.  Muscle Tone/Strength: Functionally intact. No obvious neuro-muscular anomalies detected.  Sensory (Neurological): Unimpaired          Sensory (Neurological): Unimpaired          Palpation: No palpable anomalies              Palpation: Complains of area being tender to palpation              Specialized Test(s): Deferred         Specialized Test(s): Deferred          Thoracic Spine Area Exam  Skin & Axial Inspection: No masses, redness, or swelling Alignment: Symmetrical Functional ROM: Unrestricted ROM Stability: No instability detected Muscle Tone/Strength: Functionally intact. No obvious neuro-muscular anomalies detected. Sensory (Neurological): Unimpaired Muscle strength & Tone: No palpable anomalies  Lumbar Spine Area Exam  Skin & Axial Inspection: No masses, redness, or swelling Alignment: Symmetrical Functional ROM: Unrestricted ROM      Stability: No instability detected Muscle Tone/Strength: Functionally intact. No obvious neuro-muscular anomalies detected. Sensory (Neurological): Unimpaired Palpation: No palpable anomalies       Provocative Tests: Lumbar Hyperextension and rotation test: evaluation deferred today       Lumbar Lateral bending test: evaluation deferred today       Patrick's Maneuver: evaluation deferred today                    Gait & Posture Assessment  Ambulation: Unassisted Gait: Relatively normal for age and body habitus Posture: WNL  Lower Extremity Exam    Side: Right lower extremity  Side: Left lower extremity  Skin & Extremity Inspection: Skin color, temperature, and hair growth are WNL. No peripheral edema or cyanosis. No masses, redness, swelling, asymmetry, or associated skin lesions. No contractures.  Skin & Extremity Inspection: Skin color, temperature, and hair growth are WNL. No peripheral edema or cyanosis. No masses,  redness, swelling, asymmetry, or associated skin lesions. No contractures.  Functional ROM: Unrestricted ROM          Functional ROM: Unrestricted ROM          Muscle Tone/Strength: Functionally intact. No obvious neuro-muscular anomalies detected.  Muscle Tone/Strength: Functionally intact. No obvious neuro-muscular anomalies detected.  Sensory (Neurological): Unimpaired  Sensory (Neurological): Unimpaired  Palpation: No palpable anomalies  Palpation: No palpable anomalies   Assessment  Primary Diagnosis & Pertinent Problem List: The primary encounter diagnosis was Chronic shoulder pain (Left). Diagnoses of Lumbar spondylosis, Chronic pain syndrome, Long term current use of opiate analgesic, and Chronic sacroiliac joint pain (Right) were also pertinent to this visit.  Status Diagnosis  Controlled Controlled Controlled 1. Chronic shoulder pain (Left)   2. Lumbar spondylosis   3. Chronic pain syndrome   4. Long term current use of opiate analgesic   5. Chronic sacroiliac joint pain (Right)     Problems updated and reviewed during this visit: No problems updated. Plan of Care  Pharmacotherapy (Medications Ordered): Meds ordered this encounter  Medications  . oxyCODONE (OXY IR/ROXICODONE) 5 MG immediate release tablet    Sig: Take 1 tablet (5 mg total) by mouth every 12 (twelve) hours as needed for severe pain.    Dispense:  60 tablet    Refill:  0    Fill one day early if pharmacy is closed on scheduled refill date. Do not fill until: 04/16/2018 To last until: 05/16/2018    Order Specific Question:   Supervising Provider    Answer:   Milinda Pointer [phone]  . oxyCODONE (OXY IR/ROXICODONE) 5 MG immediate release tablet    Sig: Take 1 tablet (5 mg total) by mouth every 12 (twelve) hours as needed for severe pain.    Dispense:  60 tablet    Refill:  0    Fill one day early if pharmacy is closed on scheduled refill date. Do not fill until: 03/17/2018 To last until:04/16/2018    Order  Specific Question:   Supervising Provider    Answer:   Milinda Pointer [phone]  . oxyCODONE (OXY IR/ROXICODONE) 5 MG immediate release tablet    Sig: Take 1 tablet (5 mg total) by mouth every 12 (twelve) hours as needed for severe pain.    Dispense:  60 tablet    Refill:  0    Fill one day early if pharmacy is closed on scheduled refill date. Do not fill until: 02/15/2018 To last until:03/17/2018    Order Specific Question:   Supervising Provider    Answer:   Milinda Pointer [phone]   New Prescriptions   No medications on file   Medications administered today: Ardath B. Buttery had no medications administered during this visit. Lab-work, procedure(s), and/or referral(s): Orders Placed This Encounter  Procedures  . ToxASSURE Select 13 (MW), Urine   Imaging and/or referral(s): None  Interventional management options: Planned, scheduled, and/or pending:   Diagnostic left AC joint injection + MNB (L) Biceps muscle.   Considering:   Diagnostic bilateral lumbar facet block Possible bilateral lumbar facet RFA Diagnostic bilateral sacroiliac joint block Possible  bilateral sacroiliac joint RFA Diagnostic left L4-5 transforaminal epiduralsteroid injection Palliative left L4-5 interlaminar epidural steroid injection Diagnostic bilateral intra-articular hip joint injection Diagnostic bilateral femoral nerve + obturator nerve block Possible bilateral femoral nerve + obturator nerve RFA   Palliative PRN treatment(s):   Palliative left-sided L4-5Interlaminar lumbar epidural steroid injection PalliativeL5-S1interlaminarlumbar epiduralsteroid injection Palliative left intra-articular shoulder joint injection Palliative right sacroiliac joint block   Provider-requested follow-up: Return in about 3 months (around 05/05/2018) for MedMgmt with Me Dionisio David).  No future appointments. Primary Care Physician: [name], MD Location: [facility]  Facility Note by: Vevelyn Francois NP Date: 02/02/2018; Time: 1:47 PM  Pain Score Disclaimer: We use the NRS-11 scale. This is a self-reported, subjective measurement of pain severity with only modest accuracy. It is used primarily to identify changes within a particular patient. It must be understood that outpatient pain scales are significantly less accurate that those used for research, where they can be applied under ideal controlled circumstances with minimal exposure to variables. In reality, the score is likely to be a combination of pain intensity and pain affect, where pain affect describes the degree of emotional arousal or changes in action readiness caused by the sensory experience of pain. Factors such as social and work situation, setting, emotional state, anxiety levels, expectation, and prior pain experience may influence pain perception and show large inter-individual differences that may also be affected by time variables.  Patient instructions provided during this appointment: Patient Instructions   You have been given 3 Rx for Oxycodone to last until 04/16/2018.  ____________________________________________________________________________________________  Medication Rules  Applies to: All patients receiving prescriptions (written or electronic).  Pharmacy of record: Pharmacy where electronic prescriptions will be sent. If written prescriptions are taken to a different pharmacy, please inform the nursing staff. The pharmacy listed in the electronic medical record should be the one where you would like electronic prescriptions to be sent.  Prescription refills: Only during scheduled appointments. Applies to both, written and electronic prescriptions.  NOTE: The following applies primarily to controlled substances (Opioid* Pain Medications).   Patient's responsibilities: 1. Pain Pills: Bring all pain pills to every appointment (except for procedure appointments). 2. Pill  Bottles: Bring pills in original pharmacy bottle. Always bring newest bottle. Bring bottle, even if empty. 3. Medication refills: You are responsible for knowing and keeping track of what medications you need refilled. The day before your appointment, write a list of all prescriptions that need to be refilled. Bring that list to your appointment and give it to the admitting nurse. Prescriptions will be written only during appointments. If you forget a medication, it will not be "Called in", "Faxed", or "electronically sent". You will need to get another appointment to get these prescribed. 4. Prescription Accuracy: You are responsible for carefully inspecting your prescriptions before leaving our office. Have the discharge nurse carefully go over each prescription with you, before taking them home. Make sure that your name is accurately spelled, that your address is correct. Check the name and dose of your medication to make sure it is accurate. Check the number of pills, and the written instructions to make sure they are clear and accurate. Make sure that you are given enough medication to last until your next medication refill appointment. 5. Taking Medication: Take medication as prescribed. Never take more pills than instructed. Never take medication more frequently than prescribed. Taking less pills or less frequently is permitted and encouraged, when it comes to controlled substances (written  prescriptions).  6. Inform other Doctors: Always inform, all of your healthcare providers, of all the medications you take. 7. Pain Medication from other Providers: You are not allowed to accept any additional pain medication from any other Doctor or Healthcare provider. There are two exceptions to this rule. (see below) In the event that you require additional pain medication, you are responsible for notifying us, as stated below. 8. Medication Agreement: You are responsible for carefully reading and following our  Medication Agreement. This must be signed before receiving any prescriptions from our practice. Safely store a copy of your signed Agreement. Violations to the Agreement will result in no further prescriptions. (Additional copies of our Medication Agreement are available upon request.) 9. Laws, Rules, & Regulations: All patients are expected to follow all Federal and [employer], [employer], Rules, [employer] [facility]. Ignorance of the Laws does not constitute a valid excuse. The use of any illegal substances is prohibited. 10. Adopted CDC guidelines & recommendations: Target dosing levels will be at or below 60 MME/day. Use of benzodiazepines** is not recommended.  Exceptions: There are only two exceptions to the rule of not receiving pain medications from other Healthcare Providers. 1. Exception #1 (Emergencies): In the event of an emergency (i.e.: accident requiring emergency care), you are allowed to receive additional pain medication. However, you are responsible for: As soon as you are able, call our office [phone], at any time of the day or night, and leave a message stating your name, the date and nature of the emergency, and the name and dose of the medication prescribed. In the event that your call is answered by a member of our staff, make sure to document and save the date, time, and the name of the person that took your information.  2. Exception #2 (Planned Surgery): In the event that you are scheduled by another doctor or dentist to have any type of surgery or procedure, you are allowed (for a period no longer than 30 days), to receive additional pain medication, for the acute post-op pain. However, in this case, you are responsible for picking up a copy of our "Post-op Pain Management for Surgeons" handout, and giving it to your surgeon or dentist. This document is available at our office, and does not require an appointment to obtain it. Simply go to our office during business hours  (Monday-Thursday from 8:00 AM to 4:00 PM) (Friday 8:00 AM to 12:00 Noon) or if you have a scheduled appointment with [place], prior to your surgery, and ask for it by name. In addition, you will need to provide [place] with your name, name of your surgeon, type of surgery, and date of procedure or surgery.  *Opioid medications include: morphine, codeine, oxycodone, oxymorphone, hydrocodone, hydromorphone, meperidine, tramadol, tapentadol, buprenorphine, fentanyl, methadone. **Benzodiazepine medications include: diazepam (Valium), alprazolam (Xanax), clonazepam (Klonopine), lorazepam (Ativan), clorazepate (Tranxene), chlordiazepoxide (Librium), estazolam (Prosom), oxazepam (Serax), temazepam (Restoril), triazolam (Halcion) (Last updated: 01/07/2018) ____________________________________________________________________________________________    BMI Assessment: Estimated body mass index is 30.11 kg/m as calculated from the following:   Height as of this encounter: '5\' 3"'$  (1.6 m).   Weight as of this encounter: 170 lb (77.1 kg).  BMI interpretation table: BMI level Category Range association with higher incidence of chronic pain  <18 kg/m2 Underweight   18.5-24.9 kg/m2 Ideal body weight   25-29.9 kg/m2 Overweight Increased incidence by 20%  30-34.9 kg/m2 Obese (Class I) Increased incidence by 68%  35-39.9 kg/m2 Severe obesity (Class II) Increased incidence by 136%  >  40 kg/m2 Extreme obesity (Class III) Increased incidence by 254%   BMI Readings from Last 4 Encounters:  02/02/18 30.11 kg/m  10/21/17 30.11 kg/m  07/21/17 30.11 kg/m  05/28/17 32.01 kg/m   Wt Readings from Last 4 Encounters:  02/02/18 170 lb (77.1 kg)  10/21/17 170 lb (77.1 kg)  07/21/17 170 lb (77.1 kg)  05/28/17 175 lb (79.4 kg)

## 2018-02-02 NOTE — Patient Instructions (Addendum)
You have been given 3 Rx for Oxycodone to last until 04/16/2018.  ____________________________________________________________________________________________  Medication Rules  Applies to: All patients receiving prescriptions (written or electronic).  Pharmacy of record: Pharmacy where electronic prescriptions will be sent. If written prescriptions are taken to a different pharmacy, please inform the nursing staff. The pharmacy listed in the electronic medical record should be the one where you would like electronic prescriptions to be sent.  Prescription refills: Only during scheduled appointments. Applies to both, written and electronic prescriptions.  NOTE: The following applies primarily to controlled substances (Opioid* Pain Medications).   Patient's responsibilities: 1. Pain Pills: Bring all pain pills to every appointment (except for procedure appointments). 2. Pill Bottles: Bring pills in original pharmacy bottle. Always bring newest bottle. Bring bottle, even if empty. 3. Medication refills: You are responsible for knowing and keeping track of what medications you need refilled. The day before your appointment, write a list of all prescriptions that need to be refilled. Bring that list to your appointment and give it to the admitting nurse. Prescriptions will be written only during appointments. If you forget a medication, it will not be "Called in", "Faxed", or "electronically sent". You will need to get another appointment to get these prescribed. 4. Prescription Accuracy: You are responsible for carefully inspecting your prescriptions before leaving our office. Have the discharge nurse carefully go over each prescription with you, before taking them home. Make sure that your name is accurately spelled, that your address is correct. Check the name and dose of your medication to make sure it is accurate. Check the number of pills, and the written instructions to make sure they are clear  and accurate. Make sure that you are given enough medication to last until your next medication refill appointment. 5. Taking Medication: Take medication as prescribed. Never take more pills than instructed. Never take medication more frequently than prescribed. Taking less pills or less frequently is permitted and encouraged, when it comes to controlled substances (written prescriptions).  6. Inform other Doctors: Always inform, all of your healthcare providers, of all the medications you take. 7. Pain Medication from other Providers: You are not allowed to accept any additional pain medication from any other Doctor or Healthcare provider. There are two exceptions to this rule. (see below) In the event that you require additional pain medication, you are responsible for notifying us, as stated below. 8. Medication Agreement: You are responsible for carefully reading and following our Medication Agreement. This must be signed before receiving any prescriptions from our practice. Safely store a copy of your signed Agreement. Violations to the Agreement will result in no further prescriptions. (Additional copies of our Medication Agreement are available upon request.) 9. Laws, Rules, & Regulations: All patients are expected to follow all [address] and [employer] Laws, [employer], Rules, [employer] Regulations. Ignorance of the Laws does not constitute a valid excuse. The use of any illegal substances is prohibited. 10. Adopted CDC guidelines & recommendations: Target dosing levels will be at or below 60 MME/day. Use of benzodiazepines** is not recommended.  Exceptions: There are only two exceptions to the rule of not receiving pain medications from other Healthcare Providers. 1. Exception #1 (Emergencies): In the event of an emergency (i.e.: accident requiring emergency care), you are allowed to receive additional pain medication. However, you are responsible for: As soon as you are able, call our office [phone], at any  time of the day or night, and leave a message stating your name, the date  and nature of the emergency, and the name and dose of the medication prescribed. In the event that your call is answered by a member of our staff, make sure to document and save the date, time, and the name of the person that took your information.  2. Exception #2 (Planned Surgery): In the event that you are scheduled by another doctor or dentist to have any type of surgery or procedure, you are allowed (for a period no longer than 30 days), to receive additional pain medication, for the acute post-op pain. However, in this case, you are responsible for picking up a copy of our "Post-op Pain Management for Surgeons" handout, and giving it to your surgeon or dentist. This document is available at our office, and does not require an appointment to obtain it. Simply go to our office during business hours (Monday-Thursday from 8:00 AM to 4:00 PM) (Friday 8:00 AM to 12:00 Noon) or if you have a scheduled appointment with [place], prior to your surgery, and ask for it by name. In addition, you will need to provide [place] with your name, name of your surgeon, type of surgery, and date of procedure or surgery.  *Opioid medications include: morphine, codeine, oxycodone, oxymorphone, hydrocodone, hydromorphone, meperidine, tramadol, tapentadol, buprenorphine, fentanyl, methadone. **Benzodiazepine medications include: diazepam (Valium), alprazolam (Xanax), clonazepam (Klonopine), lorazepam (Ativan), clorazepate (Tranxene), chlordiazepoxide (Librium), estazolam (Prosom), oxazepam (Serax), temazepam (Restoril), triazolam (Halcion) (Last updated: 01/07/2018) ____________________________________________________________________________________________    BMI Assessment: Estimated body mass index is 30.11 kg/m as calculated from the following:   Height as of this encounter: 5\' 3"  (1.6 m).   Weight as of this encounter: 170 lb (77.1 kg).  BMI  interpretation table: BMI level Category Range association with higher incidence of chronic pain  <18 kg/m2 Underweight   18.5-24.9 kg/m2 Ideal body weight   25-29.9 kg/m2 Overweight Increased incidence by 20%  30-34.9 kg/m2 Obese (Class I) Increased incidence by 68%  35-39.9 kg/m2 Severe obesity (Class II) Increased incidence by 136%  >40 kg/m2 Extreme obesity (Class III) Increased incidence by 254%   BMI Readings from Last 4 Encounters:  02/02/18 30.11 kg/m  10/21/17 30.11 kg/m  07/21/17 30.11 kg/m  05/28/17 32.01 kg/m   Wt Readings from Last 4 Encounters:  02/02/18 170 lb (77.1 kg)  10/21/17 170 lb (77.1 kg)  07/21/17 170 lb (77.1 kg)  05/28/17 175 lb (79.4 kg)

## 2018-02-06 LAB — TOXASSURE SELECT 13 (MW), URINE

## 2018-03-23 DIAGNOSIS — J069 Acute upper respiratory infection, unspecified: Secondary | ICD-10-CM | POA: Diagnosis not present

## 2018-03-23 DIAGNOSIS — R300 Dysuria: Secondary | ICD-10-CM | POA: Diagnosis not present

## 2018-04-13 DIAGNOSIS — R5383 Other fatigue: Secondary | ICD-10-CM | POA: Diagnosis not present

## 2018-04-13 DIAGNOSIS — R5381 Other malaise: Secondary | ICD-10-CM | POA: Diagnosis not present

## 2018-04-13 DIAGNOSIS — R0602 Shortness of breath: Secondary | ICD-10-CM | POA: Diagnosis not present

## 2018-04-13 DIAGNOSIS — E538 Deficiency of other specified B group vitamins: Secondary | ICD-10-CM | POA: Diagnosis not present

## 2018-04-13 DIAGNOSIS — E559 Vitamin D deficiency, unspecified: Secondary | ICD-10-CM | POA: Diagnosis not present

## 2018-05-06 ENCOUNTER — Telehealth: Payer: Self-pay | Admitting: *Deleted

## 2018-05-25 ENCOUNTER — Encounter: Payer: Self-pay | Admitting: Pain Medicine

## 2018-05-25 ENCOUNTER — Ambulatory Visit (HOSPITAL_BASED_OUTPATIENT_CLINIC_OR_DEPARTMENT_OTHER): Payer: PPO | Admitting: Pain Medicine

## 2018-05-25 ENCOUNTER — Ambulatory Visit
Admission: RE | Admit: 2018-05-25 | Discharge: 2018-05-25 | Disposition: A | Discharge status: Home / Self Care | Payer: PPO | Source: Ambulatory Visit | Attending: Pain Medicine | Admitting: Pain Medicine

## 2018-05-25 ENCOUNTER — Other Ambulatory Visit: Payer: Self-pay

## 2018-05-25 VITALS — BP 124/66 | HR 71 | Temp 98.3°F | Resp 22 | Ht 62.0 in | Wt 170.0 lb

## 2018-05-25 DIAGNOSIS — M47817 Spondylosis without myelopathy or radiculopathy, lumbosacral region: Secondary | ICD-10-CM | POA: Insufficient documentation

## 2018-05-25 DIAGNOSIS — M4316 Spondylolisthesis, lumbar region: Secondary | ICD-10-CM | POA: Insufficient documentation

## 2018-05-25 DIAGNOSIS — G8929 Other chronic pain: Secondary | ICD-10-CM | POA: Insufficient documentation

## 2018-05-25 DIAGNOSIS — M533 Sacrococcygeal disorders, not elsewhere classified: Secondary | ICD-10-CM

## 2018-05-25 DIAGNOSIS — M5388 Other specified dorsopathies, sacral and sacrococcygeal region: Secondary | ICD-10-CM | POA: Diagnosis not present

## 2018-05-25 DIAGNOSIS — M545 Low back pain, unspecified: Secondary | ICD-10-CM

## 2018-05-25 DIAGNOSIS — Z886 Allergy status to analgesic agent status: Secondary | ICD-10-CM | POA: Diagnosis not present

## 2018-05-25 DIAGNOSIS — M47816 Spondylosis without myelopathy or radiculopathy, lumbar region: Secondary | ICD-10-CM

## 2018-05-25 MED ORDER — ROPIVACAINE HCL 2 MG/ML IJ SOLN
4.0000 mL | Freq: Once | INTRAMUSCULAR | Status: AC
  Administered 2018-05-25: 10 mL via INTRA_ARTICULAR

## 2018-05-25 MED ORDER — METHYLPREDNISOLONE ACETATE 80 MG/ML IJ SUSP
80.0000 mg | Freq: Once | INTRAMUSCULAR | Status: AC
  Administered 2018-05-25: 80 mg via INTRA_ARTICULAR

## 2018-05-25 MED ORDER — LIDOCAINE HCL 2 % IJ SOLN
20.0000 mL | Freq: Once | INTRAMUSCULAR | Status: AC
  Administered 2018-05-25: 400 mg

## 2018-05-25 MED ORDER — METHYLPREDNISOLONE ACETATE 80 MG/ML IJ SUSP
INTRAMUSCULAR | Status: AC
  Filled 2018-05-25: qty 1

## 2018-05-25 MED ORDER — TRIAMCINOLONE ACETONIDE 40 MG/ML IJ SUSP
INTRAMUSCULAR | Status: AC
Start: 2018-05-25 — End: ?
  Filled 2018-05-25: qty 1

## 2018-05-25 MED ORDER — LIDOCAINE HCL 2 % IJ SOLN
INTRAMUSCULAR | Status: AC
  Filled 2018-05-25: qty 20

## 2018-05-25 MED ORDER — ROPIVACAINE HCL 2 MG/ML IJ SOLN
INTRAMUSCULAR | Status: AC
  Filled 2018-05-25: qty 20

## 2018-05-25 MED ORDER — ROPIVACAINE HCL 2 MG/ML IJ SOLN
9.0000 mL | Freq: Once | INTRAMUSCULAR | Status: AC
  Administered 2018-05-25: 10 mL via PERINEURAL

## 2018-05-25 MED ORDER — TRIAMCINOLONE ACETONIDE 40 MG/ML IJ SUSP
40.0000 mg | Freq: Once | INTRAMUSCULAR | Status: AC
  Administered 2018-05-25: 40 mg

## 2018-05-25 NOTE — Patient Instructions (Signed)

## 2018-05-25 NOTE — Progress Notes (Signed)
Safety precautions to be maintained throughout the outpatient stay will include: orient to surroundings, keep bed in low position, maintain call bell within reach at all times, provide assistance with transfer out of bed and ambulation.  

## 2018-05-25 NOTE — Progress Notes (Signed)
Patient's Name: Cynthia Bennett  MRN: [id number]  Referring Provider: Jerl Mina, MD  DOB: 1932-12-29  PCP: Jerl Mina, MD  DOS: 05/25/2018  Note by: Oswaldo Done, MD  Service setting: Ambulatory outpatient  Specialty: Interventional Pain Management  Patient type: Established  Location: [facility] (AMB) Pain Management Facility  Visit type: Interventional Procedure   Primary Reason for Visit: Interventional Pain Management Treatment. CC: Back Pain (left, lower)  Procedure:          Anesthesia, Analgesia, Anxiolysis:  Procedure #1: Type: Medial Branch Facet Block #1 Primary Purpose: Diagnostic Region: Lumbar Level: L2, L3, L4, L5, & S1 Medial Branch Level(s) Target Area: For Lumbar Facet blocks, the target is the groove formed by the junction of the transverse process and superior articular process. For the L5 dorsal ramus, the target is the notch between superior articular process and sacral ala. For the S1 dorsal ramus, the target is the superior and lateral edge of the posterior S1 Sacral foramen. Approach: Posterior, paramedial, percutaneous approach. Laterality: Left Position: Prone  Procedure #2: Type: Sacroiliac Joint Block #1  Primary Purpose: Diagnostic Region: Posterior Lumbosacral Level: PSIS (Posterior Superior Iliac Spine) Sacroiliac Joint Target Area: For upper sacroiliac joint block(s), the target is the superior and posterior margin of the sacroiliac joint. Approach: Ipsilateral approach. Laterality: Left Position: Prone  Type: Local Anesthesia Indication(s): Analgesia         Route: Infiltration ([place]/IM) IV Access: Declined Sedation: Declined  Local Anesthetic: Lidocaine 1-2%   Indications: 1. Spondylosis without myelopathy or radiculopathy, lumbosacral region   2. Lumbar facet syndrome (Bilateral) (L>R)   3. Lumbar facet hypertrophy (Bilateral)   4. Other specified dorsopathies, sacral and sacrococcygeal region   5. Chronic sacroiliac joint pain (Left)   6.  Grade 1 spondylolisthesis of L4 over L5   7. Chronic low back pain (Primary Source of Pain) (Bilateral) (L>R)    Pain Score: Pre-procedure: 8 /10 Post-procedure: 0-No pain/10  Pre-op Assessment:  Cynthia Bennett is a 84 y.o. (year old), female patient, seen today for interventional treatment. She  has a past surgical history that includes Esophagogastroduodenoscopy (N/A, 04/16/2015); Appendectomy; and Abdominal hysterectomy. Cynthia Bennett has a current medication list which includes the following prescription(s): acetaminophen, bupropion, dorzolamide-timolol, doxepin, meclizine, multivitamin, pantoprazole, sertraline, zolpidem, oxycodone, oxycodone, and oxycodone. Her primarily concern today is the Back Pain (left, lower)  Initial Vital Signs:  Pulse/HCG Rate: 71ECG Heart Rate: 69 Temp: 98.3 F (36.8 C) Resp: 16 BP: 139/74 SpO2: 98 %  BMI: Estimated body mass index is 31.09 kg/m as calculated from the following:   Height as of this encounter: 5\' 2"  (1.575 m).   Weight as of this encounter: 170 lb (77.1 kg).  Risk Assessment: Allergies: Reviewed. She is allergic to ibuprofen and nsaids.  Allergy Precautions: None required Coagulopathies: Reviewed. None identified.  Blood-thinner therapy: None at this time Active Infection(s): Reviewed. None identified. Cynthia Bennett is afebrile  Site Confirmation: Cynthia Bennett was asked to confirm the procedure and laterality before marking the site Procedure checklist: Completed Consent: Before the procedure and under the influence of no sedative(s), amnesic(s), or anxiolytics, the patient was informed of the treatment options, risks and possible complications. To fulfill our ethical and legal obligations, as recommended by the American Medical Association's Code of Ethics, I have informed the patient of my clinical impression; the nature and purpose of the treatment or procedure; the risks, benefits, and possible complications of the intervention; the alternatives,  including doing nothing; the risk(s) and benefit(s)  of the alternative treatment(s) or procedure(s); and the risk(s) and benefit(s) of doing nothing. The patient was provided information about the general risks and possible complications associated with the procedure. These may include, but are not limited to: failure to achieve desired goals, infection, bleeding, organ or nerve damage, allergic reactions, paralysis, and death. In addition, the patient was informed of those risks and complications associated to Spine-related procedures, such as failure to decrease pain; infection (i.e.: Meningitis, epidural or intraspinal abscess); bleeding (i.e.: epidural hematoma, subarachnoid hemorrhage, or any other type of intraspinal or peri-dural bleeding); organ or nerve damage (i.e.: Any type of peripheral nerve, nerve root, or spinal cord injury) with subsequent damage to sensory, motor, and/or autonomic systems, resulting in permanent pain, numbness, and/or weakness of one or several areas of the body; allergic reactions; (i.e.: anaphylactic reaction); and/or death. Furthermore, the patient was informed of those risks and complications associated with the medications. These include, but are not limited to: allergic reactions (i.e.: anaphylactic or anaphylactoid reaction(s)); adrenal axis suppression; blood sugar elevation that in diabetics may result in ketoacidosis or comma; water retention that in patients with history of congestive heart failure may result in shortness of breath, pulmonary edema, and decompensation with resultant heart failure; weight gain; swelling or edema; medication-induced neural toxicity; particulate matter embolism and blood vessel occlusion with resultant organ, and/or nervous system infarction; and/or aseptic necrosis of one or more joints. Finally, the patient was informed that Medicine is not an exact science; therefore, there is also the possibility of unforeseen or unpredictable risks  and/or possible complications that may result in a catastrophic outcome. The patient indicated having understood very clearly. We have given the patient no guarantees and we have made no promises. Enough time was given to the patient to ask questions, all of which were answered to the patient's satisfaction. Ms. Baltes has indicated that she wanted to continue with the procedure. Attestation: I, the ordering provider, attest that I have discussed with the patient the benefits, risks, side-effects, alternatives, likelihood of achieving goals, and potential problems during recovery for the procedure that I have provided informed consent. Date  Time: 05/25/2018 12:43 PM  Pre-Procedure Preparation:  Monitoring: As per clinic protocol. Respiration, ETCO2, SpO2, BP, heart rate and rhythm monitor placed and checked for adequate function Safety Precautions: Patient was assessed for positional comfort and pressure points before starting the procedure. Time-out: I initiated and conducted the "Time-out" before starting the procedure, as per protocol. The patient was asked to participate by confirming the accuracy of the "Time Out" information. Verification of the correct person, site, and procedure were performed and confirmed by me, the nursing staff, and the patient. "Time-out" conducted as per Joint Commission's Universal Protocol (UP.01.01.01). Time: 1336  Description of Procedure #1:   Time-out: "Time-out" completed before starting procedure, as per protocol. Area Prepped: Entire Posterior Lumbosacral Region Prepping solution: ChloraPrep (2% chlorhexidine gluconate and 70% isopropyl alcohol) Safety Precautions: Aspiration looking for blood return was conducted prior to all injections. At no point did we inject any substances, as a needle was being advanced. No attempts were made at seeking any paresthesias. Safe injection practices and needle disposal techniques used. Medications properly checked for  expiration dates. SDV (single dose vial) medications used.  Description of the Procedure: Protocol guidelines were followed. The patient was placed in position over the fluoroscopy table. The target area was identified and the area prepped in the usual manner. Skin & deeper tissues infiltrated with local anesthetic. Appropriate amount of time  allowed to pass for local anesthetics to take effect. The procedure needle was introduced through the skin, ipsilateral to the reported pain, and advanced to the target area. Employing the "Medial Branch Technique", the needles were advanced to the angle made by the superior and medial portion of the transverse process, and the lateral and inferior portion of the superior articulating process of the targeted vertebral bodies. This area is known as "Burton's Eye" or the "Eye of the [place]". A procedure needle was introduced through the skin, and this time advanced to the angle made by the superior and medial border of the sacral ala, and the lateral border of the S1 vertebral body. This last needle was later repositioned at the superior and lateral border of the posterior S1 foramen. Negative aspiration confirmed. Solution injected in intermittent fashion, asking for systemic symptoms every 0.5cc of injectate. The needles were then removed and the area cleansed, making sure to leave some of the prepping solution back to take advantage of its long term bactericidal properties. Start Time: 1336 hrs. Materials:  Needle(s) Type: Regular needle Gauge: 22G Length: 3.5-in Medication(s): Please see orders for medications and dosing details.  Description of Procedure # 2:   Area Prepped: Entire Posterior Lumbosacral Region Prepping solution: ChloraPrep (2% chlorhexidine gluconate and 70% isopropyl alcohol) Safety Precautions: Aspiration looking for blood return was conducted prior to all injections. At no point did we inject any substances, as a needle was being  advanced. No attempts were made at seeking any paresthesias. Safe injection practices and needle disposal techniques used. Medications properly checked for expiration dates. SDV (single dose vial) medications used. Description of the Procedure: Protocol guidelines were followed. The patient was placed in position over the fluoroscopy table. The target area was identified and the area prepped in the usual manner. Skin & deeper tissues infiltrated with local anesthetic. Appropriate amount of time allowed to pass for local anesthetics to take effect. The procedure needle was advanced under fluoroscopic guidance into the sacroiliac joint until a firm endpoint was obtained. Proper needle placement secured. Negative aspiration confirmed. Solution injected in intermittent fashion, asking for systemic symptoms every 0.5cc of injectate. The needles were then removed and the area cleansed, making sure to leave some of the prepping solution back to take advantage of its long term bactericidal properties. Vitals:   05/25/18 1335 05/25/18 1340 05/25/18 1345 05/25/18 1350  BP: 130/74 135/63 117/62 124/66  Pulse:      Resp: 19 15 18  (!) 22  Temp:      TempSrc:      SpO2: 96% 98% 97% 96%  Weight:      Height:        End Time: 1348 hrs. Materials:  Needle(s) Type: Regular needle Gauge: 22G Length: 3.5-in Medication(s): Please see orders for medications and dosing details.  Imaging Guidance (Spinal):          Type of Imaging Technique: Fluoroscopy Guidance (Spinal) Indication(s): Assistance in needle guidance and placement for procedures requiring needle placement in or near specific anatomical locations not easily accessible without such assistance. Exposure Time: Please see nurses notes. Contrast: None used. Fluoroscopic Guidance: I was personally present during the use of fluoroscopy. "Tunnel Vision Technique" used to obtain the best possible view of the target area. Parallax error corrected before  commencing the procedure. "Direction-depth-direction" technique used to introduce the needle under continuous pulsed fluoroscopy. Once target was reached, antero-posterior, oblique, and lateral fluoroscopic projection used confirm needle placement in all planes. Images permanently  stored in EMR. Interpretation: No contrast injected. I personally interpreted the imaging intraoperatively. Adequate needle placement confirmed in multiple planes. Permanent images saved into the patient's record.  Antibiotic Prophylaxis:   Anti-infectives (From admission, onward)   None     Indication(s): None identified  Post-operative Assessment:  Post-procedure Vital Signs:  Pulse/HCG Rate: 7169 Temp: 98.3 F (36.8 C) Resp: (!) 22 BP: 124/66 SpO2: 96 %  EBL: None  Complications: No immediate post-treatment complications observed by team, or reported by patient.  Note: The patient tolerated the entire procedure well. A repeat set of vitals were taken after the procedure and the patient was kept under observation following institutional policy, for this type of procedure. Post-procedural neurological assessment was performed, showing return to baseline, prior to discharge. The patient was provided with post-procedure discharge instructions, including a section on how to identify potential problems. Should any problems arise concerning this procedure, the patient was given instructions to immediately contact us, at any time, without hesitation. In any case, we plan to contact the patient by telephone for a follow-up status report regarding this interventional procedure.  Comments:  No additional relevant information.  Plan of Care   Interventional management options: Planned, scheduled, and/or pending: Diagnostic left-sided lumbar facet + left-sided sacroiliac joint block #1 under fluoroscopic guidance, no sedation (today)    Considering:  Diagnostic bilateral lumbar facet block Possible bilateral  lumbar facet RFA Diagnostic bilateral sacroiliac joint block Possible bilateral sacroiliac joint RFA Diagnostic left L4-5 transforaminal epiduralsteroid injection Palliative left L4-5 interlaminar epidural steroid injection Diagnostic bilateral intra-articular hip joint injection Diagnostic bilateral femoral nerve + obturator nerve block Possible bilateral femoral nerve + obturator nerve RFA Diagnostic left AC joint injection + MNB (L) Biceps muscle.   Palliative PRN treatment(s):  Palliative left-sided L4-5Interlaminar lumbar epidural steroid injection PalliativeL5-S1interlaminarlumbar epiduralsteroid injection Palliative left intra-articular shoulder joint injection Palliative right sacroiliac joint block   Imaging Orders     DG C-Arm 1-60 Min-No Report  Procedure Orders     LUMBAR FACET(MEDIAL BRANCH NERVE BLOCK) MBNB     SACROILIAC JOINT INJECTION  Medications ordered for procedure: Meds ordered this encounter  Medications  . lidocaine (XYLOCAINE) 2 % (with pres) injection 400 mg  . ropivacaine (PF) 2 mg/mL (0.2%) (NAROPIN) injection 9 mL  . triamcinolone acetonide (KENALOG-40) injection 40 mg  . ropivacaine (PF) 2 mg/mL (0.2%) (NAROPIN) injection 4 mL  . methylPREDNISolone acetate (DEPO-MEDROL) injection 80 mg   Medications administered: We administered lidocaine, ropivacaine (PF) 2 mg/mL (0.2%), triamcinolone acetonide, ropivacaine (PF) 2 mg/mL (0.2%), and methylPREDNISolone acetate.  See the medical record for exact dosing, route, and time of administration.  New Prescriptions   No medications on file   Disposition: Discharge home  Discharge Date & Time: 05/25/2018; 1351 hrs.   Physician-requested Follow-up: Return for post-procedure eval (2 wks), w/ Dr. Laban Emperor.  Future Appointments  Date Time Provider Department Center  06/09/2018 11:15 AM Delano Metz, MD [facility] None   Primary Care Physician: Jerl Mina, MD Location: [facility]  Outpatient Pain Management Facility Note by: Oswaldo Done, MD Date: 05/25/2018; Time: 2:16 PM  Disclaimer:  Medicine is not an Visual merchandiser. The only guarantee in medicine is that nothing is guaranteed. It is important to note that the decision to proceed with this intervention was based on the information collected from the patient. The Data and conclusions were drawn from the patient's questionnaire, the interview, and the physical examination. Because the information was provided in large part by the patient, it  cannot be guaranteed that it has not been purposely or unconsciously manipulated. Every effort has been made to obtain as much relevant data as possible for this evaluation. It is important to note that the conclusions that lead to this procedure are derived in large part from the available data. Always take into account that the treatment will also be dependent on availability of resources and existing treatment guidelines, considered by other Pain Management Practitioners as being common knowledge and practice, at the time of the intervention. For Medico-Legal purposes, it is also important to point out that variation in procedural techniques and pharmacological choices are the acceptable norm. The indications, contraindications, technique, and results of the above procedure should only be interpreted and judged by a Board-Certified Interventional Pain Specialist with extensive familiarity and expertise in the same exact procedure and technique.

## 2018-06-04 DIAGNOSIS — E538 Deficiency of other specified B group vitamins: Secondary | ICD-10-CM | POA: Diagnosis not present

## 2018-06-09 ENCOUNTER — Ambulatory Visit: Payer: PPO | Attending: Pain Medicine | Admitting: Pain Medicine

## 2018-06-09 ENCOUNTER — Ambulatory Visit: Payer: PPO | Admitting: Pain Medicine

## 2018-06-09 ENCOUNTER — Other Ambulatory Visit: Payer: Self-pay

## 2018-06-09 ENCOUNTER — Encounter: Payer: Self-pay | Admitting: Pain Medicine

## 2018-06-09 VITALS — BP 139/95 | HR 85 | Temp 98.7°F | Resp 18 | Ht 62.0 in | Wt 170.0 lb

## 2018-06-09 DIAGNOSIS — G894 Chronic pain syndrome: Secondary | ICD-10-CM

## 2018-06-09 DIAGNOSIS — Z5181 Encounter for therapeutic drug level monitoring: Secondary | ICD-10-CM | POA: Diagnosis not present

## 2018-06-09 DIAGNOSIS — M47817 Spondylosis without myelopathy or radiculopathy, lumbosacral region: Secondary | ICD-10-CM | POA: Insufficient documentation

## 2018-06-09 DIAGNOSIS — Z79891 Long term (current) use of opiate analgesic: Secondary | ICD-10-CM | POA: Insufficient documentation

## 2018-06-09 DIAGNOSIS — M25552 Pain in left hip: Secondary | ICD-10-CM | POA: Diagnosis not present

## 2018-06-09 DIAGNOSIS — N183 Chronic kidney disease, stage 3 (moderate): Secondary | ICD-10-CM | POA: Diagnosis not present

## 2018-06-09 DIAGNOSIS — Z818 Family history of other mental and behavioral disorders: Secondary | ICD-10-CM | POA: Insufficient documentation

## 2018-06-09 DIAGNOSIS — M797 Fibromyalgia: Secondary | ICD-10-CM | POA: Diagnosis not present

## 2018-06-09 DIAGNOSIS — M4316 Spondylolisthesis, lumbar region: Secondary | ICD-10-CM | POA: Diagnosis not present

## 2018-06-09 DIAGNOSIS — Z825 Family history of asthma and other chronic lower respiratory diseases: Secondary | ICD-10-CM | POA: Diagnosis not present

## 2018-06-09 DIAGNOSIS — Z79899 Other long term (current) drug therapy: Secondary | ICD-10-CM | POA: Diagnosis not present

## 2018-06-09 DIAGNOSIS — G8929 Other chronic pain: Secondary | ICD-10-CM

## 2018-06-09 DIAGNOSIS — Q7649 Other congenital malformations of spine, not associated with scoliosis: Secondary | ICD-10-CM | POA: Diagnosis not present

## 2018-06-09 DIAGNOSIS — M899 Disorder of bone, unspecified: Secondary | ICD-10-CM | POA: Diagnosis not present

## 2018-06-09 DIAGNOSIS — Z886 Allergy status to analgesic agent status: Secondary | ICD-10-CM | POA: Insufficient documentation

## 2018-06-09 DIAGNOSIS — M5416 Radiculopathy, lumbar region: Secondary | ICD-10-CM | POA: Diagnosis not present

## 2018-06-09 DIAGNOSIS — M5116 Intervertebral disc disorders with radiculopathy, lumbar region: Secondary | ICD-10-CM | POA: Insufficient documentation

## 2018-06-09 DIAGNOSIS — M79605 Pain in left leg: Secondary | ICD-10-CM | POA: Insufficient documentation

## 2018-06-09 DIAGNOSIS — M25512 Pain in left shoulder: Secondary | ICD-10-CM | POA: Insufficient documentation

## 2018-06-09 DIAGNOSIS — M859 Disorder of bone density and structure, unspecified: Secondary | ICD-10-CM | POA: Insufficient documentation

## 2018-06-09 DIAGNOSIS — M19012 Primary osteoarthritis, left shoulder: Secondary | ICD-10-CM | POA: Insufficient documentation

## 2018-06-09 DIAGNOSIS — M545 Low back pain: Secondary | ICD-10-CM | POA: Diagnosis not present

## 2018-06-09 DIAGNOSIS — M48061 Spinal stenosis, lumbar region without neurogenic claudication: Secondary | ICD-10-CM | POA: Insufficient documentation

## 2018-06-09 DIAGNOSIS — Z789 Other specified health status: Secondary | ICD-10-CM | POA: Diagnosis not present

## 2018-06-09 DIAGNOSIS — E785 Hyperlipidemia, unspecified: Secondary | ICD-10-CM | POA: Insufficient documentation

## 2018-06-09 DIAGNOSIS — M47812 Spondylosis without myelopathy or radiculopathy, cervical region: Secondary | ICD-10-CM | POA: Insufficient documentation

## 2018-06-09 DIAGNOSIS — M1612 Unilateral primary osteoarthritis, left hip: Secondary | ICD-10-CM | POA: Diagnosis not present

## 2018-06-09 DIAGNOSIS — K219 Gastro-esophageal reflux disease without esophagitis: Secondary | ICD-10-CM | POA: Insufficient documentation

## 2018-06-09 DIAGNOSIS — Z90710 Acquired absence of both cervix and uterus: Secondary | ICD-10-CM | POA: Diagnosis not present

## 2018-06-09 DIAGNOSIS — M5136 Other intervertebral disc degeneration, lumbar region: Secondary | ICD-10-CM

## 2018-06-09 DIAGNOSIS — N319 Neuromuscular dysfunction of bladder, unspecified: Secondary | ICD-10-CM | POA: Insufficient documentation

## 2018-06-09 DIAGNOSIS — M48062 Spinal stenosis, lumbar region with neurogenic claudication: Secondary | ICD-10-CM | POA: Diagnosis not present

## 2018-06-09 DIAGNOSIS — H409 Unspecified glaucoma: Secondary | ICD-10-CM | POA: Insufficient documentation

## 2018-06-09 DIAGNOSIS — Z8711 Personal history of peptic ulcer disease: Secondary | ICD-10-CM | POA: Insufficient documentation

## 2018-06-09 DIAGNOSIS — Z87891 Personal history of nicotine dependence: Secondary | ICD-10-CM | POA: Insufficient documentation

## 2018-06-09 DIAGNOSIS — Z8673 Personal history of transient ischemic attack (TIA), and cerebral infarction without residual deficits: Secondary | ICD-10-CM | POA: Diagnosis not present

## 2018-06-09 DIAGNOSIS — M5388 Other specified dorsopathies, sacral and sacrococcygeal region: Secondary | ICD-10-CM | POA: Insufficient documentation

## 2018-06-09 MED ORDER — OXYCODONE HCL 5 MG PO TABS: 5.0000 mg | ORAL_TABLET | Freq: Two times a day (BID) | ORAL | 0 refills | Status: DC | PRN

## 2018-06-09 NOTE — Progress Notes (Signed)
Patient's Name: Cynthia Bennett  MRN: [id number]  Referring Provider: [name], MD  DOB: 1933-07-29  PCP: [name], MD  DOS: 06/09/2018  Note by: Gaspar Cola, MD  Service setting: Ambulatory outpatient  Specialty: Interventional Pain Management  Location: [facility] (AMB) Pain Management Facility    Patient type: Established   Primary Reason(s) for Visit: Encounter for post-procedure evaluation of chronic illness with mild to moderate exacerbation CC: Back Pain (left, lower)  HPI  Cynthia Bennett is a 84 y.o. year old, female patient, who comes today for a post-procedure evaluation. She has Fibromyalgia; Adiposity; Current tobacco use; Chronic pain syndrome; Lumbar radicular pain (Bilateral) (R>L); Encounter for therapeutic drug level monitoring; Long term current use of opiate analgesic; Long term prescription opiate use; Uncomplicated opioid dependence ([place]); Opiate use; Acid reflux; Hyperlipidemia; Cannot sleep; Primary open angle glaucoma; Post menopausal syndrome; Temporary cerebral vascular dysfunction; Chronic low back pain (Primary Source of Pain) (Bilateral) (L>R); Grade 1 spondylolisthesis of L4 over L5; Lumbar discogenic pain syndrome; Lumbar facet syndrome (Bilateral) (L>R); Lumbar facet hypertrophy (Bilateral); Chronic sacroiliac joint pain (Left); Chronic hip pain (Left); Peptic ulcer disease; Morbid obesity ([place]); GERD (gastroesophageal reflux disease); Encounter for chronic pain management; Opiate analgesic contract exists ([facility]); Lumbar spondylosis; Abnormal MRI, lumbar spine (06/25/2016); Cervical spondylosis (Anterolisthesis of C5 over C6); Difficulty voiding; Neurogenic bladder; Chronic lower extremity pain (Secondary source of pain) (Left); Chronic lumbar radicular pain (Left) (L5 Dermatome); Osteoarthritis of hip (Left); Chronic hip pain (Bilateral) (R>L); Chronic sacroiliac joint pain (Bilateral) (R>L); Kidney disease, chronic, stage III (GFR  30-59 ml/min) ([place]); Chronic shoulder pain (Left); Osteoarthritis of shoulder (Left); Chronic sacroiliac joint pain (Right); Chronic hip pain (Right); Left upper arm pain; Myofascial pain syndrome (left upper extremity); Pain in left acromioclavicular joint; Osteoarthritis of left AC (acromioclavicular) joint; Supraspinatus tendon tear, left, sequela; Spondylosis without myelopathy or radiculopathy, lumbosacral region; Other specified dorsopathies, sacral and sacrococcygeal region; Bertolotti's syndrome (Left); Pharmacologic therapy; Disorder of skeletal system; Problems influencing health status; DDD (degenerative disc disease), lumbar; and Severe (5-6 mm) L4-5 lumbar spinal stenosis and (8 mm) L3-4 spinal stenosis. on their problem list. Her primarily concern today is the Back Pain (left, lower)  Pain Assessment: Location: Left, Lower Back Radiating: numbness in left lower leg, pain in left foot Onset: More than a month ago Duration: Chronic pain Quality: Burning Severity: 6 /10 (subjective, self-reported pain score)  Note: Reported level is inconsistent with clinical observations. Clinically the patient looks like a 4/10 A 4/10 is viewed as "Moderately Severe" and described as impossible to ignore for more than a few minutes. With effort, patients may still be able to manage work or participate in some social activities. Very difficult to concentrate. Signs of autonomic nervous system discharge are evident: dilated pupils (mydriasis); mild sweating (diaphoresis); sleep interference. Heart rate becomes elevated (>115 bpm). Diastolic blood pressure (lower number) rises above 100 mmHg. Patients find relief in laying down and not moving. Information on the proper use of the pain scale provided to the patient today. When using our objective Pain Scale, levels between 6 and 10/10 are said to belong in an emergency room, as it progressively worsens from a 6/10, described as severely limiting, requiring  emergency care not usually available at an outpatient pain management facility. At a 6/10 level, communication becomes difficult and requires great effort. Assistance to reach the emergency department may be required. Facial flushing and profuse sweating along with potentially dangerous increases in heart rate and  blood pressure will be evident. Timing: Intermittent Modifying factors: TENS BP: (!) 139/95  HR: 85  Cynthia Bennett comes in today for post-procedure evaluation after the treatment done on 05/25/2018.  Further details on both, my assessment(s), as well as the proposed treatment plan, please see below.  Post-Procedure Assessment  05/25/2018 Procedure: Diagnostic left sided Lumbar facet block #1 + diagnostic sacroiliac joint block #1 under fluoroscopic guidance, no sedation Pre-procedure pain score:  8/10 Post-procedure pain score: 0/10 (100% relief) Influential Factors: BMI: 31.09 kg/m Intra-procedural challenges: None observed.         Assessment challenges: None detected.              Reported side-effects: None.        Post-procedural adverse reactions or complications: None reported         Sedation: No sedation used. When no sedatives are used, the analgesic levels obtained are directly associated to the effectiveness of the local anesthetics. However, when sedation is provided, the level of analgesia obtained during the initial 1 hour following the intervention, is believed to be the result of a combination of factors. These factors may include, but are not limited to: 1. The effectiveness of the local anesthetics used. 2. The effects of the analgesic(s) and/or anxiolytic(s) used. 3. The degree of discomfort experienced by the patient at the time of the procedure. 4. The patients ability and reliability in recalling and recording the events. 5. The presence and influence of possible secondary gains and/or psychosocial factors. Reported result: Relief experienced during the 1st hour  after the procedure: 100 % (Ultra-Short Term Relief) Cynthia Bennett has indicated area to have been numb during this time. Interpretative annotation: Clinically appropriate result. No IV Analgesic or Anxiolytic given, therefore benefits are completely due to Local Anesthetic effects.          Effects of local anesthetic: The analgesic effects attained during this period are directly associated to the localized infiltration of local anesthetics and therefore cary significant diagnostic value as to the etiological location, or anatomical origin, of the pain. Expected duration of relief is directly dependent on the pharmacodynamics of the local anesthetic used. Long-acting (4-6 hours) anesthetics used.  Reported result: Relief during the next 4 to 6 hour after the procedure: 100 % (Short-Term Relief) Ms. Gulotta has indicated area to have been numb during this time. Interpretative annotation: Clinically appropriate result. Analgesia during this period is likely to be Local Anesthetic-related.          Long-term benefit: Defined as the period of time past the expected duration of local anesthetics (1 hour for short-acting and 4-6 hours for long-acting). With the possible exception of prolonged sympathetic blockade from the local anesthetics, benefits during this period are typically attributed to, or associated with, other factors such as analgesic sensory neuropraxia, antiinflammatory effects, or beneficial biochemical changes provided by agents other than the local anesthetics.  Reported result: Extended relief following procedure: 50 % (Long-Term Relief)            Interpretative annotation: Clinically possible results. Partial relief. Incomplete therapeutic success. Limited inflammation. Possible mechanical aggravating factors. Benefit believed to be steroid-related.  Current benefits: Defined as reported results that persistent at this point in time.   Analgesia: 50 % Ms. Blatter reports improvement of axial  symptoms. Function: Somewhat improved ROM: Somewhat improved Interpretative annotation: Ongoing benefit. Limited therapeutic benefit. Results would suggest persistent aggravating factors. Benefit could be steroid-related.  Interpretation: Results would suggest a successful diagnostic intervention.  Plan:  Set up procedure as a PRN palliative treatment option for this patient.                Controlled Substance Pharmacotherapy Assessment REMS (Risk Evaluation and Mitigation Strategy)  Analgesic: Oxycodone IR 5 mg PO q12hrs. MME/day:63m/day  No notes on file Pharmacokinetics: Liberation and absorption (onset of action): WNL Distribution (time to peak effect): WNL Metabolism and excretion (duration of action): WNL         Pharmacodynamics: Desired effects: Analgesia: Ms. WVukelichreports >50% benefit. Functional ability: Patient reports that medication allows her to accomplish basic ADLs Clinically meaningful improvement in function (CMIF): Sustained CMIF goals met Perceived effectiveness: Described as relatively effective, allowing for increase in activities of daily living (ADL) Undesirable effects: Side-effects or Adverse reactions: None reported Monitoring: [place] PMP: Online review of the past 166-montheriod conducted. Compliant with practice rules and regulations Last UDS on record: Summary  Date Value Ref Range Status  02/02/2018 FINAL  Final    Comment:    ==================================================================== TOXASSURE SELECT 13 (MW) ==================================================================== Test                             Result       Flag       Units Drug Present and Declared for Prescription Verification   Noroxycodone                   769          EXPECTED   ng/mg creat    Noroxycodone is an expected metabolite of oxycodone. Sources of    oxycodone include scheduled prescription medications. Drug Absent but Declared for  Prescription Verification   Oxycodone                      Not Detected UNEXPECTED ng/mg creat    Oxycodone is almost always present in patients taking this drug    consistently.  Absence of oxycodone could be due to lapse of time    since the last dose or unusual pharmacokinetics (rapid    metabolism). ==================================================================== Test                      Result    Flag   Units      Ref Range   Creatinine              68               mg/dL      >=20 ==================================================================== Declared Medications:  The flagging and interpretation on this report are based on the  following declared medications.  Unexpected results may arise from  inaccuracies in the declared medications.  **Note: The testing scope of this panel includes these medications:  Oxycodone  **Note: The testing scope of this panel does not include following  reported medications:  Acetaminophen  Bupropion  Dorzolamide  Doxepin  Meclizine  Multivitamin (MVI)  Pantoprazole  Sertraline  Timolol  Zolpidem ==================================================================== For clinical consultation, please call ([phone]====================================================================    UDS interpretation: Compliant          Medication Assessment Form: Reviewed. Patient indicates being compliant with therapy Treatment compliance: Compliant Risk Assessment Profile: Aberrant behavior: See prior evaluations. None observed or detected today Comorbid factors increasing risk of overdose: See prior notes. No additional risks detected today Risk of substance use disorder (SUD): Low  ORT Scoring interpretation  table:  Score <3 = Low Risk for SUD  Score between 4-7 = Moderate Risk for SUD  Score >8 = High Risk for Opioid Abuse   Risk Mitigation Strategies:  Patient Counseling: Covered Patient-Prescriber Agreement (PPA): Present  and active  Notification to other healthcare providers: Done  Pharmacologic Plan: No change in therapy, at this time.            Laboratory Chemistry  Inflammation Markers (CRP: Acute Phase) (ESR: Chronic Phase) Lab Results  Component Value Date   CRP 0.6 06/24/2016   ESRSEDRATE 23 06/24/2016                         Renal Markers Lab Results  Component Value Date   BUN 13 06/24/2016   CREATININE 0.95 06/24/2016   GFRAA >60 06/24/2016   GFRNONAA 55 (L) 06/24/2016                             Hepatic Markers Lab Results  Component Value Date   AST 17 06/24/2016   ALT 11 (L) 06/24/2016   ALBUMIN 3.9 06/24/2016                        Neuropathy Markers No results found for: HGBA1C, HIV                      Hematology Parameters Lab Results  Component Value Date   INR 0.96 06/24/2016   LABPROT 12.8 06/24/2016   PLT 291 06/24/2016   HGB 12.7 06/24/2016   HCT 38.6 06/24/2016                        CV Markers Lab Results  Component Value Date   TROPONINI < 0.02 04/27/2014                         Note: Lab results reviewed.  Recent Diagnostic Imaging Results  DG C-Arm 1-60 Min-No Report Fluoroscopy was utilized by the requesting physician.  No radiographic  interpretation.   Complexity Note: I personally reviewed the fluoroscopic imaging of the procedure.                        Meds   Current Outpatient Medications:  .  acetaminophen (TYLENOL) 325 MG tablet, Take 650 mg by mouth every 6 (six) hours as needed., Disp: , Rfl:  .  buPROPion (WELLBUTRIN SR) 150 MG 12 hr tablet, TAKE 1 TABLET BY MOUTH ONCE DAILY, Disp: , Rfl:  .  dorzolamide-timolol (COSOPT) 22.3-6.8 MG/ML ophthalmic solution, INT 1 GTT IN OU BID, Disp: , Rfl: 5 .  doxepin (SINEQUAN) 100 MG capsule, Take 100 mg by mouth at bedtime., Disp: , Rfl:  .  meclizine (ANTIVERT) 12.5 MG tablet, Take 1 tablet by mouth daily as needed., Disp: , Rfl: 1 .  Multiple Vitamin (MULTIVITAMIN) tablet, Take 1 tablet by  mouth daily., Disp: , Rfl:  .  [START ON 08/08/2018] oxyCODONE (OXY IR/ROXICODONE) 5 MG immediate release tablet, Take 1 tablet (5 mg total) by mouth every 12 (twelve) hours as needed for severe pain., Disp: 60 tablet, Rfl: 0 .  [START ON 07/09/2018] oxyCODONE (OXY IR/ROXICODONE) 5 MG immediate release tablet, Take 1 tablet (5 mg total) by mouth every 12 (twelve) hours as needed for severe pain., Disp:  60 tablet, Rfl: 0 .  oxyCODONE (OXY IR/ROXICODONE) 5 MG immediate release tablet, Take 1 tablet (5 mg total) by mouth every 12 (twelve) hours as needed for severe pain., Disp: 60 tablet, Rfl: 0 .  pantoprazole (PROTONIX) 40 MG tablet, Take 1 tablet by mouth 2 (two) times daily., Disp: , Rfl: 11 .  sertraline (ZOLOFT) 100 MG tablet, Take 1 tablet by mouth every morning., Disp: , Rfl:  .  zolpidem (AMBIEN) 10 MG tablet, TAKE 1/2 TO 1 TABLET BY MOUTH EVERY NIGHT AT BEDTIME, Disp: , Rfl:   ROS  Constitutional: Denies any fever or chills Gastrointestinal: No reported hemesis, hematochezia, vomiting, or acute GI distress Musculoskeletal: Denies any acute onset joint swelling, redness, loss of ROM, or weakness Neurological: No reported episodes of acute onset apraxia, aphasia, dysarthria, agnosia, amnesia, paralysis, loss of coordination, or loss of consciousness  Allergies  Ms. Wofford is allergic to ibuprofen and nsaids.  PFSH  Drug: Ms. Kerce  reports that she does not use drugs. Alcohol:  reports that she does not drink alcohol. Tobacco:  reports that she has quit smoking. She has never used smokeless tobacco. Medical:  has a past medical history of Dizzy spells, Fibromyalgia, Glaucoma, Hiatal hernia, Osteoarthritis, Osteoarthritis of spine with radiculopathy, lumbar region (08/14/2015), Peptic ulcer disease, Reflux, Spinal stenosis, Stroke ([place]), and TIA (transient ischemic attack). Surgical: Ms. Helming  has a past surgical history that includes Esophagogastroduodenoscopy (N/A, 04/16/2015); Appendectomy; and  Abdominal hysterectomy. Family: family history includes COPD in her father; Mental illness in her mother.  Constitutional Exam  General appearance: Well nourished, well developed, and well hydrated. In no apparent acute distress Vitals:   06/09/18 1316  BP: (!) 139/95  Pulse: 85  Resp: 18  Temp: 98.7 F (37.1 C)  TempSrc: Oral  SpO2: 98%  Weight: 170 lb (77.1 kg)  Height: _0  (1.575 m)   BMI Assessment: Estimated body mass index is 31.09 kg/m as calculated from the following:   Height as of this encounter: _1  (1.575 m).   Weight as of this encounter: 170 lb (77.1 kg).  BMI interpretation table: BMI level Category Range association with higher incidence of chronic pain  <18 kg/m2 Underweight   18.5-24.9 kg/m2 Ideal body weight   25-29.9 kg/m2 Overweight Increased incidence by 20%  30-34.9 kg/m2 Obese (Class I) Increased incidence by 68%  35-39.9 kg/m2 Severe obesity (Class II) Increased incidence by 136%  >40 kg/m2 Extreme obesity (Class III) Increased incidence by 254%   Patient's current BMI Ideal Body weight  Body mass index is 31.09 kg/m. Ideal body weight: 50.1 kg (110 lb 7.2 oz) Adjusted ideal body weight: 60.9 kg (134 lb 4.3 oz)   BMI Readings from Last 4 Encounters:  06/09/18 31.09 kg/m  05/25/18 31.09 kg/m  02/02/18 30.11 kg/m  10/21/17 30.11 kg/m   Wt Readings from Last 4 Encounters:  06/09/18 170 lb (77.1 kg)  05/25/18 170 lb (77.1 kg)  02/02/18 170 lb (77.1 kg)  10/21/17 170 lb (77.1 kg)  Psych/Mental status: Alert, oriented x 3 (person, place, & time)       Eyes: PERLA Respiratory: No evidence of acute respiratory distress  Cervical Spine Area Exam  Skin & Axial Inspection: No masses, redness, edema, swelling, or associated skin lesions Alignment: Symmetrical Functional ROM: Unrestricted ROM      Stability: No instability detected Muscle Tone/Strength: Functionally intact. No obvious neuro-muscular anomalies detected. Sensory  (Neurological): Unimpaired Palpation: No palpable anomalies  Upper Extremity (UE) Exam    Side: Right upper extremity  Side: Left upper extremity  Skin & Extremity Inspection: Skin color, temperature, and hair growth are WNL. No peripheral edema or cyanosis. No masses, redness, swelling, asymmetry, or associated skin lesions. No contractures.  Skin & Extremity Inspection: Skin color, temperature, and hair growth are WNL. No peripheral edema or cyanosis. No masses, redness, swelling, asymmetry, or associated skin lesions. No contractures.  Functional ROM: Unrestricted ROM          Functional ROM: Unrestricted ROM          Muscle Tone/Strength: Functionally intact. No obvious neuro-muscular anomalies detected.  Muscle Tone/Strength: Functionally intact. No obvious neuro-muscular anomalies detected.  Sensory (Neurological): Unimpaired          Sensory (Neurological): Unimpaired          Palpation: No palpable anomalies              Palpation: No palpable anomalies              Provocative Test(s):  Phalen's test: deferred Tinel's test: deferred Apley's scratch test (touch opposite shoulder):  Action 1 (Across chest): deferred Action 2 (Overhead): deferred Action 3 (LB reach): deferred   Provocative Test(s):  Phalen's test: deferred Tinel's test: deferred Apley's scratch test (touch opposite shoulder):  Action 1 (Across chest): deferred Action 2 (Overhead): deferred Action 3 (LB reach): deferred    Thoracic Spine Area Exam  Skin & Axial Inspection: No masses, redness, or swelling Alignment: Symmetrical Functional ROM: Unrestricted ROM Stability: No instability detected Muscle Tone/Strength: Functionally intact. No obvious neuro-muscular anomalies detected. Sensory (Neurological): Unimpaired Muscle strength & Tone: No palpable anomalies  Lumbar Spine Area Exam  Skin & Axial Inspection: No masses, redness, or swelling Alignment: Symmetrical Functional ROM: Limited ROM        Stability: No instability detected Muscle Tone/Strength: Increased muscle tone over affected area Sensory (Neurological): Movement-associated pain Palpation: Complains of area being tender to palpation       Provocative Tests: Lumbar Hyperextension/rotation test: (+) bilaterally for facet joint pain. Lumbar quadrant test (Kemp's test): (+) bilaterally for facet joint pain. Lumbar Lateral bending test: deferred today       Patrick's Maneuver: deferred today                   FABER test: deferred today                   Thigh-thrust test: deferred today       S-I compression test: deferred today       S-I distraction test: deferred today        Gait & Posture Assessment  Ambulation: Patient ambulates using a walker Gait: Limited. Using assistive device to ambulate Posture: Difficulty standing up straight, due to pain   Lower Extremity Exam    Side: Right lower extremity  Side: Left lower extremity  Stability: No instability observed          Stability: No instability observed          Skin & Extremity Inspection: Skin color, temperature, and hair growth are WNL. No peripheral edema or cyanosis. No masses, redness, swelling, asymmetry, or associated skin lesions. No contractures.  Skin & Extremity Inspection: Skin color, temperature, and hair growth are WNL. No peripheral edema or cyanosis. No masses, redness, swelling, asymmetry, or associated skin lesions. No contractures.  Functional ROM: Unrestricted ROM  Functional ROM: Pain restricted ROM                  Muscle Tone/Strength: Functionally intact. No obvious neuro-muscular anomalies detected.  Muscle Tone/Strength: Moderate-to-severe deconditioning  Sensory (Neurological): Unimpaired  Sensory (Neurological): Dermatomal pain pattern  Palpation: No palpable anomalies  Palpation: No palpable anomalies   Assessment  Primary Diagnosis & Pertinent Problem List: The primary encounter diagnosis was Chronic lower extremity  pain (Secondary source of pain) (Left). Diagnoses of Chronic low back pain (Primary Source of Pain) (Bilateral) (L>R), Chronic lumbar radicular pain (Left) (L5 Dermatome), Grade 1 spondylolisthesis of L4 over L5, Severe (5-6 mm) L4-5 lumbar spinal stenosis and (8 mm) L3-4 spinal stenosis., Bertolotti's syndrome (Left), DDD (degenerative disc disease), lumbar, Chronic pain syndrome, Pharmacologic therapy, Disorder of skeletal system, and Problems influencing health status were also pertinent to this visit.  Status Diagnosis  Worsening Improved Worsening 1. Chronic lower extremity pain (Secondary source of pain) (Left)   2. Chronic low back pain (Primary Source of Pain) (Bilateral) (L>R)   3. Chronic lumbar radicular pain (Left) (L5 Dermatome)   4. Grade 1 spondylolisthesis of L4 over L5   5. Severe (5-6 mm) L4-5 lumbar spinal stenosis and (8 mm) L3-4 spinal stenosis.   6. Bertolotti's syndrome (Left)   7. DDD (degenerative disc disease), lumbar   8. Chronic pain syndrome   9. Pharmacologic therapy   10. Disorder of skeletal system   11. Problems influencing health status     Problems updated and reviewed during this visit: Problem  Bertolotti's syndrome (Left)  Ddd (Degenerative Disc Disease), Lumbar  Severe (5-6 mm) L4-5 lumbar spinal stenosis and (8 mm) L3-4 spinal stenosis.  Pharmacologic Therapy  Disorder of Skeletal System  Problems Influencing Health Status   Plan of Care  Pharmacotherapy (Medications Ordered): Meds ordered this encounter  Medications  . oxyCODONE (OXY IR/ROXICODONE) 5 MG immediate release tablet    Sig: Take 1 tablet (5 mg total) by mouth every 12 (twelve) hours as needed for severe pain.    Dispense:  60 tablet    Refill:  0    Medication for Chronic Pain (G89.4). [place] STOP ACT - Not applicable. Fill one day early if pharmacy is closed on scheduled refill date.  Do not fill until: 08/08/18 To last until: 09/07/18  . oxyCODONE (OXY IR/ROXICODONE) 5 MG  immediate release tablet    Sig: Take 1 tablet (5 mg total) by mouth every 12 (twelve) hours as needed for severe pain.    Dispense:  60 tablet    Refill:  0    Medication for Chronic Pain (G89.4). [place] STOP ACT - Not applicable. Fill one day early if pharmacy is closed on scheduled refill date.  Do not fill until: 07/09/18 To last until: 08/08/18  . oxyCODONE (OXY IR/ROXICODONE) 5 MG immediate release tablet    Sig: Take 1 tablet (5 mg total) by mouth every 12 (twelve) hours as needed for severe pain.    Dispense:  60 tablet    Refill:  0    Medication for Chronic Pain (G89.4).  STOP ACT - Not applicable. Fill one day early if pharmacy is closed on scheduled refill date.  Do not fill until: 06/09/18 To last until: 07/09/18   Medications administered today: Letta Median B. Giambalvo had no medications administered during this visit.   Procedure Orders     Lumbar Epidural Injection  Lab Orders     Magnesium     Sedimentation rate  25-Hydroxyvitamin D Lcms D2+D3     C-reactive protein Imaging Orders  No imaging studies ordered today   Referral Orders  No referral(s) requested today    Interventional management options: Planned, scheduled, and/or pending:   Palliative left-sided L4-5 interlaminar LESI under fluoroscopic guidance, no sedation    Considering:   Diagnostic bilateral lumbar facet block Possible bilateral lumbar facet RFA Diagnostic bilateral sacroiliac joint block Possible bilateral sacroiliac joint RFA Diagnostic left L4-5 transforaminal LESI Palliative left L4-5 interlaminar LESI Diagnostic bilateral intra-articular hip joint injection Diagnostic bilateral femoral nerve + obturator nerve block Possible bilateral femoral nerve + obturator nerve RFA Palliative left AC joint injection + MNB (L) Biceps muscle.   Palliative PRN treatment(s):   Palliative left-sided L4-5Interlaminar LESI PalliativeL5-S1interlaminarLESI Palliative left intra-articular  shoulder joint injection Palliative right sacroiliac joint block   Provider-requested follow-up: Return for Procedure (no sedation): (L) L4-5 LESI.  Future Appointments  Date Time Provider [place]  06/29/2018 10:15 AM Milinda Pointer, MD [facility] None   Primary Care Physician: [name], MD Location: [facility] Note by: Gaspar Cola, MD Date: 06/09/2018; Time: 2:08 PM

## 2018-06-09 NOTE — Patient Instructions (Addendum)
_You have been given 3 scripts for oxycodone today. ___________________________________________________________________________________________  Preparing for your procedure (without sedation)  Instructions: . Oral Intake: Do not eat or drink anything for at least 3 hours prior to your procedure. . Transportation: Unless otherwise stated by your physician, you may drive yourself after the procedure. . Blood Pressure Medicine: Take your blood pressure medicine with a sip of water the morning of the procedure. . Blood thinners: Notify our staff if you are taking any blood thinners. Depending on which one you take, there will be specific instructions on how and when to stop it. . Diabetics on insulin: Notify the staff so that you can be scheduled 1st case in the morning. If your diabetes requires high dose insulin, take only  of your normal insulin dose the morning of the procedure and notify the staff that you have done so. . Preventing infections: Shower with an antibacterial soap the morning of your procedure.  . Build-up your immune system: Take 1000 mg of Vitamin C with every meal (3 times a day) the day prior to your procedure. Marland Kitchen. Antibiotics: Inform the staff if you have a condition or reason that requires you to take antibiotics before dental procedures. . Pregnancy: If you are pregnant, call and cancel the procedure. . Sickness: If you have a cold, fever, or any active infections, call and cancel the procedure. . Arrival: You must be in the facility at least 30 minutes prior to your scheduled procedure. . Children: Do not bring any children with you. . Dress appropriately: Bring dark clothing that you would not mind if they get stained. . Valuables: Do not bring any jewelry or valuables.  Procedure appointments are reserved for interventional treatments only. Marland Kitchen. No Prescription Refills. . No medication changes will be discussed during procedure appointments. . No disability issues will be  discussed.  Reasons to call and reschedule or cancel your procedure: (Following these recommendations will minimize the risk of a serious complication.) . Surgeries: Avoid having procedures within 2 weeks of any surgery. (Avoid for 2 weeks before or after any surgery). . Flu Shots: Avoid having procedures within 2 weeks of a flu shots or . (Avoid for 2 weeks before or after immunizations). . Barium: Avoid having a procedure within 7-10 days after having had a radiological study involving the use of radiological contrast. (Myelograms, Barium swallow or enema study). . Heart attacks: Avoid any elective procedures or surgeries for the initial 6 months after a "Myocardial Infarction" (Heart Attack). . Blood thinners: It is imperative that you stop these medications before procedures. Let us know if you if you take any blood thinner.  . Infection: Avoid procedures during or within two weeks of an infection (including chest colds or gastrointestinal problems). Symptoms associated with infections include: Localized redness, fever, chills, night sweats or profuse sweating, burning sensation when voiding, cough, congestion, stuffiness, runny nose, sore throat, diarrhea, nausea, vomiting, cold or Flu symptoms, recent or current infections. It is specially important if the infection is over the area that we intend to treat. Marland Kitchen. Heart and lung problems: Symptoms that may suggest an active cardiopulmonary problem include: cough, chest pain, breathing difficulties or shortness of breath, dizziness, ankle swelling, uncontrolled high or unusually low blood pressure, and/or palpitations. If you are experiencing any of these symptoms, cancel your procedure and contact your primary care physician for an evaluation.  Remember:  Regular Business hours are:  Monday to Thursday 8:00 AM to 4:00 PM  Provider's Schedule: Delano MetzFrancisco Graceland Wachter, MD:  Procedure days: Tuesday and Thursday 7:30 AM to 4:00 PM  Edward Jolly, MD:   Procedure days: Monday and Wednesday 7:30 AM to 4:00 PM ____________________________________________________________________________________________  ____________________________________________________________________________________________  Pain Scale  Introduction: The pain score used by this practice is the Verbal Numerical Rating Scale (VNRS-11). This is an 11-point scale. It is for adults and children 10 years or older. There are significant differences in how the pain score is reported, used, and applied. Forget everything you learned in the past and learn this scoring system.  General Information: The scale should reflect your current level of pain. Unless you are specifically asked for the level of your worst pain, or your average pain. If you are asked for one of these two, then it should be understood that it is over the past 24 hours.  Basic Activities of Daily Living (ADL): Personal hygiene, dressing, eating, transferring, and using restroom.  Instructions: Most patients tend to report their level of pain as a combination of two factors, their physical pain and their psychosocial pain. This last one is also known as "suffering" and it is reflection of how physical pain affects you socially and psychologically. From now on, report them separately. From this point on, when asked to report your pain level, report only your physical pain. Use the following table for reference.  Pain Clinic Pain Levels (0-5/10)  Pain Level Score  Description  No Pain 0   Mild pain 1 Nagging, annoying, but does not interfere with basic activities of daily living (ADL). Patients are able to eat, bathe, get dressed, toileting (being able to get on and off the toilet and perform personal hygiene functions), transfer (move in and out of bed or a chair without assistance), and maintain continence (able to control bladder and bowel functions). Blood pressure and heart rate are unaffected. A normal heart rate  for a healthy adult ranges from 60 to 100 bpm (beats per minute).   Mild to moderate pain 2 Noticeable and distracting. Impossible to hide from other people. More frequent flare-ups. Still possible to adapt and function close to normal. It can be very annoying and may have occasional stronger flare-ups. With discipline, patients may get used to it and adapt.   Moderate pain 3 Interferes significantly with activities of daily living (ADL). It becomes difficult to feed, bathe, get dressed, get on and off the toilet or to perform personal hygiene functions. Difficult to get in and out of bed or a chair without assistance. Very distracting. With effort, it can be ignored when deeply involved in activities.   Moderately severe pain 4 Impossible to ignore for more than a few minutes. With effort, patients may still be able to manage work or participate in some social activities. Very difficult to concentrate. Signs of autonomic nervous system discharge are evident: dilated pupils (mydriasis); mild sweating (diaphoresis); sleep interference. Heart rate becomes elevated (>115 bpm). Diastolic blood pressure (lower number) rises above 100 mmHg. Patients find relief in laying down and not moving.   Severe pain 5 Intense and extremely unpleasant. Associated with frowning face and frequent crying. Pain overwhelms the senses.  Ability to do any activity or maintain social relationships becomes significantly limited. Conversation becomes difficult. Pacing back and forth is common, as getting into a comfortable position is nearly impossible. Pain wakes you up from deep sleep. Physical signs will be obvious: pupillary dilation; increased sweating; goosebumps; brisk reflexes; cold, clammy hands and feet; nausea, vomiting or dry heaves; loss of appetite; significant sleep disturbance  with inability to fall asleep or to remain asleep. When persistent, significant weight loss is observed due to the complete loss of appetite and  sleep deprivation.  Blood pressure and heart rate becomes significantly elevated. Caution: If elevated blood pressure triggers a pounding headache associated with blurred vision, then the patient should immediately seek attention at an urgent or emergency care unit, as these may be signs of an impending stroke.    Emergency Department Pain Levels (6-10/10)  Emergency Room Pain 6 Severely limiting. Requires emergency care and should not be seen or managed at an outpatient pain management facility. Communication becomes difficult and requires great effort. Assistance to reach the emergency department may be required. Facial flushing and profuse sweating along with potentially dangerous increases in heart rate and blood pressure will be evident.   Distressing pain 7 Self-care is very difficult. Assistance is required to transport, or use restroom. Assistance to reach the emergency department will be required. Tasks requiring coordination, such as bathing and getting dressed become very difficult.   Disabling pain 8 Self-care is no longer possible. At this level, pain is disabling. The individual is unable to do even the most "basic" activities such as walking, eating, bathing, dressing, transferring to a bed, or toileting. Fine motor skills are lost. It is difficult to think clearly.   Incapacitating pain 9 Pain becomes incapacitating. Thought processing is no longer possible. Difficult to remember your own name. Control of movement and coordination are lost.   The worst pain imaginable 10 At this level, most patients pass out from pain. When this level is reached, collapse of the autonomic nervous system occurs, leading to a sudden drop in blood pressure and heart rate. This in turn results in a temporary and dramatic drop in blood flow to the brain, leading to a loss of consciousness. Fainting is one of the body's self defense mechanisms. Passing out puts the brain in a calmed state and causes it to shut  down for a while, in order to begin the healing process.    Summary: 1. Refer to this scale when providing [place] with your pain level. 2. Be accurate and careful when reporting your pain level. This will help with your care. 3. Over-reporting your pain level will lead to loss of credibility. 4. Even a level of 1/10 means that there is pain and will be treated at our facility. 5. High, inaccurate reporting will be documented as "Symptom Exaggeration", leading to loss of credibility and suspicions of possible secondary gains such as obtaining more narcotics, or wanting to appear disabled, for fraudulent reasons. 6. Only pain levels of 5 or below will be seen at our facility. 7. Pain levels of 6 and above will be sent to the Emergency Department and the appointment cancelled. ____________________________________________________________________________________________   Preparing for your procedure (without sedation) Instructions: . Oral Intake: Do not eat or drink anything for at least 3 hours prior to your procedure. . Transportation: Unless otherwise stated by your physician, you may drive yourself after the procedure. . Blood Pressure Medicine: Take your blood pressure medicine with a sip of water the morning of the procedure. . Insulin: Take only  of your normal insulin dose. . Preventing infections: Shower with an antibacterial soap the morning of your procedure. . Build-up your immune system: Take 1000 mg of Vitamin C with every meal (3 times a day) the day prior to your procedure. . Pregnancy: If you are pregnant, call and cancel the procedure. . Sickness: If you  have a cold, fever, or any active infections, call and cancel the procedure. . Arrival: You must be in the facility at least 30 minutes prior to your scheduled procedure. . Children: Do not bring any children with you. . Dress appropriately: Bring dark clothing that you would not mind if they get stained. . Valuables: Do not  bring any jewelry or valuables. Procedure appointments are reserved for interventional treatments only. Marland Kitchen No Prescription Refills. . No medication changes will be discussed during procedure appointments. . No disability issues will be discussed.

## 2018-06-10 LAB — MAGNESIUM: MAGNESIUM: 2.3 mg/dL (ref 1.6–2.3)

## 2018-06-14 LAB — 25-HYDROXYVITAMIN D LCMS D2+D3
25-HYDROXY, VITAMIN D-2: 2 ng/mL
25-HYDROXY, VITAMIN D-3: 31 ng/mL
25-HYDROXY, VITAMIN D: 33 ng/mL

## 2018-06-14 LAB — C-REACTIVE PROTEIN: CRP: 1 mg/L (ref 0–10)

## 2018-06-14 LAB — SEDIMENTATION RATE: SED RATE: 37 mm/h (ref 0–40)

## 2018-06-29 ENCOUNTER — Other Ambulatory Visit: Payer: Self-pay

## 2018-06-29 ENCOUNTER — Encounter: Payer: Self-pay | Admitting: Pain Medicine

## 2018-06-29 ENCOUNTER — Ambulatory Visit
Admission: RE | Admit: 2018-06-29 | Discharge: 2018-06-29 | Disposition: A | Discharge status: Home / Self Care | Payer: PPO | Source: Ambulatory Visit | Attending: Pain Medicine | Admitting: Pain Medicine

## 2018-06-29 ENCOUNTER — Ambulatory Visit (HOSPITAL_BASED_OUTPATIENT_CLINIC_OR_DEPARTMENT_OTHER): Payer: PPO | Admitting: Pain Medicine

## 2018-06-29 VITALS — BP 141/66 | HR 90 | Temp 98.4°F | Resp 20 | Ht 62.0 in | Wt 170.0 lb

## 2018-06-29 DIAGNOSIS — M5116 Intervertebral disc disorders with radiculopathy, lumbar region: Secondary | ICD-10-CM | POA: Diagnosis not present

## 2018-06-29 DIAGNOSIS — M5136 Other intervertebral disc degeneration, lumbar region: Secondary | ICD-10-CM

## 2018-06-29 DIAGNOSIS — M4316 Spondylolisthesis, lumbar region: Secondary | ICD-10-CM

## 2018-06-29 DIAGNOSIS — G8929 Other chronic pain: Secondary | ICD-10-CM

## 2018-06-29 DIAGNOSIS — M5416 Radiculopathy, lumbar region: Secondary | ICD-10-CM

## 2018-06-29 DIAGNOSIS — M545 Low back pain: Secondary | ICD-10-CM | POA: Diagnosis present

## 2018-06-29 DIAGNOSIS — M79605 Pain in left leg: Secondary | ICD-10-CM

## 2018-06-29 MED ORDER — IOPAMIDOL (ISOVUE-M 200) INJECTION 41%
10.0000 mL | Freq: Once | INTRAMUSCULAR | Status: AC
  Administered 2018-06-29: 10 mL via EPIDURAL
  Filled 2018-06-29: qty 10

## 2018-06-29 MED ORDER — SODIUM CHLORIDE 0.9% FLUSH
2.0000 mL | Freq: Once | INTRAVENOUS | Status: AC
  Administered 2018-06-29: 10 mL

## 2018-06-29 MED ORDER — TRIAMCINOLONE ACETONIDE 40 MG/ML IJ SUSP
40.0000 mg | Freq: Once | INTRAMUSCULAR | Status: AC
  Administered 2018-06-29: 40 mg
  Filled 2018-06-29: qty 1

## 2018-06-29 MED ORDER — ROPIVACAINE HCL 2 MG/ML IJ SOLN
2.0000 mL | Freq: Once | INTRAMUSCULAR | Status: AC
  Administered 2018-06-29: 10 mL via EPIDURAL
  Filled 2018-06-29: qty 10

## 2018-06-29 MED ORDER — LIDOCAINE HCL 2 % IJ SOLN
20.0000 mL | Freq: Once | INTRAMUSCULAR | Status: AC
  Administered 2018-06-29: 400 mg
  Filled 2018-06-29: qty 40

## 2018-06-29 NOTE — Patient Instructions (Signed)

## 2018-06-29 NOTE — Progress Notes (Signed)
Patient's Name: Cynthia Bennett  MRN: [id number]  Referring Provider: Jerl Mina, MD  DOB: [birth year]  PCP: Jerl Mina, MD  DOS: 06/29/2018  Note by: Oswaldo Done, MD  Service setting: Ambulatory outpatient  Specialty: Interventional Pain Management  Patient type: Established  Location: [facility] (AMB) Pain Management Facility  Visit type: Interventional Procedure   Primary Reason for Visit: Interventional Pain Management Treatment. CC: Back Pain (lower)  Procedure:          Anesthesia, Analgesia, Anxiolysis:  Type: Palliative Inter-Laminar Epidural Steroid Injection #5  Region: Lumbar Level: L4-5 Level. Laterality: Left Paramedial  Type: Local Anesthesia Indication(s): Analgesia         Route: Infiltration ([place]/IM) IV Access: Declined Sedation: Declined  Local Anesthetic: Lidocaine 1-2%   Indications: 1. DDD (degenerative disc disease), lumbar   2. Grade 1 spondylolisthesis of L4 over L5   3. Chronic lumbar radicular pain (Left) (L5 Dermatome)   4. Chronic lower extremity pain (Secondary source of pain) (Left)    Pain Score: Pre-procedure: 7 /10 Post-procedure: 0-No pain/10  Pre-op Assessment:  Cynthia Bennett is a 84 y.o. (year old), female patient, seen today for interventional treatment. She  has a past surgical history that includes Esophagogastroduodenoscopy (N/A, 04/16/2015); Appendectomy; and Abdominal hysterectomy. Ms. Grumbine has a current medication list which includes the following prescription(s): acetaminophen, bupropion, dorzolamide-timolol, doxepin, meclizine, multivitamin, oxycodone, oxycodone, oxycodone, pantoprazole, sertraline, and zolpidem. Her primarily concern today is the Back Pain (lower)  Initial Vital Signs:  Pulse/HCG Rate: 90ECG Heart Rate: 87 Temp: 98.4 F (36.9 C) Resp: 16 BP: 130/69 SpO2: 97 %  BMI: Estimated body mass index is 31.09 kg/m as calculated from the following:   Height as of this encounter: 5\' 2"  (1.575 m).   Weight as of this encounter:  170 lb (77.1 kg).  Risk Assessment: Allergies: Reviewed. She is allergic to ibuprofen and nsaids.  Allergy Precautions: None required Coagulopathies: Reviewed. None identified.  Blood-thinner therapy: None at this time Active Infection(s): Reviewed. None identified. Cynthia Bennett is afebrile  Site Confirmation: Cynthia Bennett was asked to confirm the procedure and laterality before marking the site Procedure checklist: Completed Consent: Before the procedure and under the influence of no sedative(s), amnesic(s), or anxiolytics, the patient was informed of the treatment options, risks and possible complications. To fulfill our ethical and legal obligations, as recommended by the American Medical Association's Code of Ethics, I have informed the patient of my clinical impression; the nature and purpose of the treatment or procedure; the risks, benefits, and possible complications of the intervention; the alternatives, including doing nothing; the risk(s) and benefit(s) of the alternative treatment(s) or procedure(s); and the risk(s) and benefit(s) of doing nothing. The patient was provided information about the general risks and possible complications associated with the procedure. These may include, but are not limited to: failure to achieve desired goals, infection, bleeding, organ or nerve damage, allergic reactions, paralysis, and death. In addition, the patient was informed of those risks and complications associated to Spine-related procedures, such as failure to decrease pain; infection (i.e.: Meningitis, epidural or intraspinal abscess); bleeding (i.e.: epidural hematoma, subarachnoid hemorrhage, or any other type of intraspinal or peri-dural bleeding); organ or nerve damage (i.e.: Any type of peripheral nerve, nerve root, or spinal cord injury) with subsequent damage to sensory, motor, and/or autonomic systems, resulting in permanent pain, numbness, and/or weakness of one or several areas of the body;  allergic reactions; (i.e.: anaphylactic reaction); and/or death. Furthermore, the patient was informed of those risks  and complications associated with the medications. These include, but are not limited to: allergic reactions (i.e.: anaphylactic or anaphylactoid reaction(s)); adrenal axis suppression; blood sugar elevation that in diabetics may result in ketoacidosis or comma; water retention that in patients with history of congestive heart failure may result in shortness of breath, pulmonary edema, and decompensation with resultant heart failure; weight gain; swelling or edema; medication-induced neural toxicity; particulate matter embolism and blood vessel occlusion with resultant organ, and/or nervous system infarction; and/or aseptic necrosis of one or more joints. Finally, the patient was informed that Medicine is not an exact science; therefore, there is also the possibility of unforeseen or unpredictable risks and/or possible complications that may result in a catastrophic outcome. The patient indicated having understood very clearly. We have given the patient no guarantees and we have made no promises. Enough time was given to the patient to ask questions, all of which were answered to the patient's satisfaction. Cynthia Bennett has indicated that she wanted to continue with the procedure. Attestation: I, the ordering provider, attest that I have discussed with the patient the benefits, risks, side-effects, alternatives, likelihood of achieving goals, and potential problems during recovery for the procedure that I have provided informed consent. Date  Time: 06/29/2018 10:19 AM  Pre-Procedure Preparation:  Monitoring: As per clinic protocol. Respiration, ETCO2, SpO2, BP, heart rate and rhythm monitor placed and checked for adequate function Safety Precautions: Patient was assessed for positional comfort and pressure points before starting the procedure. Time-out: I initiated and conducted the "Time-out"  before starting the procedure, as per protocol. The patient was asked to participate by confirming the accuracy of the "Time Out" information. Verification of the correct person, site, and procedure were performed and confirmed by me, the nursing staff, and the patient. "Time-out" conducted as per Joint Commission's Universal Protocol (UP.01.01.01). Time: 1110  Description of Procedure:          Position: Prone with head of the table was raised to facilitate breathing. Target Area: The interlaminar space, initially targeting the lower laminar border of the superior vertebral body. Approach: Paramedial approach. Area Prepped: Entire Posterior Lumbar Region Prepping solution: ChloraPrep (2% chlorhexidine gluconate and 70% isopropyl alcohol) Safety Precautions: Aspiration looking for blood return was conducted prior to all injections. At no point did we inject any substances, as a needle was being advanced. No attempts were made at seeking any paresthesias. Safe injection practices and needle disposal techniques used. Medications properly checked for expiration dates. SDV (single dose vial) medications used. Description of the Procedure: Protocol guidelines were followed. The procedure needle was introduced through the skin, ipsilateral to the reported pain, and advanced to the target area. Bone was contacted and the needle walked caudad, until the lamina was cleared. The epidural space was identified using "loss-of-resistance technique" with 2-3 ml of PF-NaCl (0.9% NSS), in a 5cc LOR glass syringe. Vitals:   06/29/18 1105 06/29/18 1111 06/29/18 1115 06/29/18 1122  BP: 116/61 127/64 (!) 114/59 (!) 141/66  Pulse:      Resp: (!) 25 18 17 20   Temp:      SpO2: 95% 96% 98% 98%  Weight:      Height:        Start Time: 1110 hrs. End Time: 1117 hrs. Materials:  Needle(s) Type: Epidural needle Gauge: 17G Length: 3.5-in Medication(s): Please see orders for medications and dosing details.  Imaging  Guidance (Spinal):          Type of Imaging Technique: Fluoroscopy Guidance (Spinal) Indication(s):  Assistance in needle guidance and placement for procedures requiring needle placement in or near specific anatomical locations not easily accessible without such assistance. Exposure Time: Please see nurses notes. Contrast: Before injecting any contrast, we confirmed that the patient did not have an allergy to iodine, shellfish, or radiological contrast. Once satisfactory needle placement was completed at the desired level, radiological contrast was injected. Contrast injected under live fluoroscopy. No contrast complications. See chart for type and volume of contrast used. Fluoroscopic Guidance: I was personally present during the use of fluoroscopy. "Tunnel Vision Technique" used to obtain the best possible view of the target area. Parallax error corrected before commencing the procedure. "Direction-depth-direction" technique used to introduce the needle under continuous pulsed fluoroscopy. Once target was reached, antero-posterior, oblique, and lateral fluoroscopic projection used confirm needle placement in all planes. Images permanently stored in EMR. Interpretation: I personally interpreted the imaging intraoperatively. Adequate needle placement confirmed in multiple planes. Appropriate spread of contrast into desired area was observed. No evidence of afferent or efferent intravascular uptake. No intrathecal or subarachnoid spread observed. Permanent images saved into the patient's record.  Antibiotic Prophylaxis:   Anti-infectives (From admission, onward)   None     Indication(s): None identified  Post-operative Assessment:  Post-procedure Vital Signs:  Pulse/HCG Rate: 9078 Temp: 98.4 F (36.9 C) Resp: 20 BP: (!) 141/66 SpO2: 98 %  EBL: None  Complications: No immediate post-treatment complications observed by team, or reported by patient.  Note: The patient tolerated the entire  procedure well. A repeat set of vitals were taken after the procedure and the patient was kept under observation following institutional policy, for this type of procedure. Post-procedural neurological assessment was performed, showing return to baseline, prior to discharge. The patient was provided with post-procedure discharge instructions, including a section on how to identify potential problems. Should any problems arise concerning this procedure, the patient was given instructions to immediately contact us, at any time, without hesitation. In any case, we plan to contact the patient by telephone for a follow-up status report regarding this interventional procedure.  Comments:  No additional relevant information.  Plan of Care    Imaging Orders     DG C-Arm 1-60 Min-No Report  Procedure Orders     Lumbar Epidural Injection  Medications ordered for procedure: Meds ordered this encounter  Medications  . iopamidol (ISOVUE-M) 41 % intrathecal injection 10 mL    Must be Myelogram-compatible. If not available, you may substitute with a water-soluble, non-ionic, hypoallergenic, myelogram-compatible radiological contrast medium.  Marland Kitchen. lidocaine (XYLOCAINE) 2 % (with pres) injection 400 mg  . sodium chloride flush (NS) 0.9 % injection 2 mL  . ropivacaine (PF) 2 mg/mL (0.2%) (NAROPIN) injection 2 mL  . triamcinolone acetonide (KENALOG-40) injection 40 mg   Medications administered: We administered iopamidol, lidocaine, sodium chloride flush, ropivacaine (PF) 2 mg/mL (0.2%), and triamcinolone acetonide.  See the medical record for exact dosing, route, and time of administration.  New Prescriptions   No medications on file   Disposition: Discharge home  Discharge Date & Time: 06/29/2018; 1125 hrs.   Physician-requested Follow-up: Return for post-procedure eval (2 wks), w/ Dr. Laban EmperorNaveira.  Future Appointments  Date Time Provider Department Center  07/19/2018  1:15 PM Delano MetzNaveira, Nakoma Gotwalt, MD  [facility] None   Primary Care Physician: Jerl MinaHedrick, James, MD [facility] Note by: Oswaldo DoneFrancisco A Dyami Umbach, MD Date: 06/29/2018; Time: 12:03 PM  Disclaimer:  Medicine is not an Visual merchandiserexact science. The only guarantee in medicine is that  nothing is guaranteed. It is important to note that the decision to proceed with this intervention was based on the information collected from the patient. The Data and conclusions were drawn from the patient's questionnaire, the interview, and the physical examination. Because the information was provided in large part by the patient, it cannot be guaranteed that it has not been purposely or unconsciously manipulated. Every effort has been made to obtain as much relevant data as possible for this evaluation. It is important to note that the conclusions that lead to this procedure are derived in large part from the available data. Always take into account that the treatment will also be dependent on availability of resources and existing treatment guidelines, considered by other Pain Management Practitioners as being common knowledge and practice, at the time of the intervention. For Medico-Legal purposes, it is also important to point out that variation in procedural techniques and pharmacological choices are the acceptable norm. The indications, contraindications, technique, and results of the above procedure should only be interpreted and judged by a Board-Certified Interventional Pain Specialist with extensive familiarity and expertise in the same exact procedure and technique.

## 2018-06-30 ENCOUNTER — Telehealth: Payer: Self-pay | Admitting: *Deleted

## 2018-06-30 NOTE — Telephone Encounter (Signed)
Spoke with patient denies any questions or concerns from procedure.  

## 2018-07-16 DIAGNOSIS — E538 Deficiency of other specified B group vitamins: Secondary | ICD-10-CM | POA: Diagnosis not present

## 2018-07-18 NOTE — Progress Notes (Signed)
Patient's Name: Cynthia Bennett  MRN: [id number]  Referring Provider: [name], MD  DOB: 08-Jun-1933  PCP: [name], MD  DOS: 07/19/2018  Note by: Gaspar Cola, MD  Service setting: Ambulatory outpatient  Specialty: Interventional Pain Management  Location: [facility] (AMB) Pain Management Facility    Patient type: Established   Primary Reason(s) for Visit: Encounter for post-procedure evaluation of chronic illness with mild to moderate exacerbation CC: Hip Pain (right) and Sciatica (right)  HPI  Cynthia Bennett is a 84 y.o. year old, female patient, who comes today for a post-procedure evaluation. She has Fibromyalgia; Adiposity; Current tobacco use; Chronic pain syndrome; Lumbar radicular pain (Bilateral) (R>L); Encounter for therapeutic drug level monitoring; Long term current use of opiate analgesic; Long term prescription opiate use; Uncomplicated opioid dependence ([place]); Opiate use; Acid reflux; Hyperlipidemia; Cannot sleep; Primary open angle glaucoma; Post menopausal syndrome; Temporary cerebral vascular dysfunction; Chronic low back pain (Primary Source of Pain) (Bilateral) (L>R); Grade 1 spondylolisthesis of L4 over L5; Lumbar discogenic pain syndrome; Lumbar facet syndrome (Bilateral) (L>R); Lumbar facet hypertrophy (Bilateral); Chronic sacroiliac joint pain (Left); Chronic hip pain (Left); Peptic ulcer disease; Morbid obesity ([place]); GERD (gastroesophageal reflux disease); Encounter for chronic pain management; Opiate analgesic contract exists ([facility]); Lumbar spondylosis; Abnormal MRI, lumbar spine (06/25/2016); Cervical spondylosis (Anterolisthesis of C5 over C6); Difficulty voiding; Neurogenic bladder; Chronic lower extremity pain (Secondary source of pain) (Left); Chronic lumbar radicular pain (Left) (L5 Dermatome); Osteoarthritis of hip (Left); Chronic hip pain (Bilateral) (R>L); Chronic sacroiliac joint pain (Bilateral) (R>L); Kidney disease, chronic,  stage III (GFR 30-59 ml/min) ([place]); Chronic shoulder pain (Left); Osteoarthritis of shoulder (Left); Chronic sacroiliac joint pain (Right); Chronic hip pain (Right); Left upper arm pain; Myofascial pain syndrome (left upper extremity); Pain in left acromioclavicular joint; Osteoarthritis of left AC (acromioclavicular) joint; Supraspinatus tendon tear, left, sequela; Spondylosis without myelopathy or radiculopathy, lumbosacral region; Other specified dorsopathies, sacral and sacrococcygeal region; Bertolotti's syndrome (Left); Pharmacologic therapy; Disorder of skeletal system; Problems influencing health status; DDD (degenerative disc disease), lumbar; and Severe (5-6 mm) L4-5 lumbar spinal stenosis and (8 mm) L3-4 spinal stenosis. on their problem list. Her primarily concern today is the Hip Pain (right) and Sciatica (right)  Pain Assessment: Location: Right Hip Radiating: right foot stings at times Onset: More than a month ago Duration: Chronic pain Quality: ("it feels like bone on bone") Severity: 7 (low back pain is 0.)/10 (subjective, self-reported pain score)  Note: Reported level is inconsistent with clinical observations. Clinically the patient looks like a 5/10 A 5/10 is viewed as "Severe" and described as intense and extremely unpleasant. Associated with frowning face and frequent crying. Pain overwhelms the senses.  Ability to do any activity or maintain social relationships becomes significantly limited. Conversation becomes difficult. Pacing back and forth is common, as getting into a comfortable position is nearly impossible. Pain wakes you up from deep sleep. Physical signs will be obvious: pupillary dilation; increased sweating; goosebumps; brisk reflexes; cold, clammy hands and feet; nausea, vomiting or dry heaves; loss of appetite; significant sleep disturbance with inability to fall asleep or to remain asleep. When persistent, significant weight loss is observed due to the complete loss  of appetite and sleep deprivation.  Blood pressure and heart rate becomes significantly elevated. Cynthia Bennett does not seem to understand the use of our objective pain scale When using our objective Pain Scale, levels between 6 and 10/10 are said to belong in an emergency room, as it progressively  worsens from a 6/10, described as severely limiting, requiring emergency care not usually available at an outpatient pain management facility. At a 6/10 level, communication becomes difficult and requires great effort. Assistance to reach the emergency department may be required. Facial flushing and profuse sweating along with potentially dangerous increases in heart rate and blood pressure will be evident. Timing: Intermittent Modifying factors: rest, ice BP: (!) 160/74  HR: 90  Cynthia Bennett comes in today for post-procedure evaluation after the treatment done on 06/30/2018.  She seems to be doing well with regards to her lower back and in fact she indicates having no pain in that area.  However, she does complain of pain in the right buttocks.  She seems to initially point at the area of the SI joint but according to the results of today's Saralyn Pilar maneuver, it would seem that her pain is actually coming from her right hip.  She has decreased range of motion and pain that radiates to her right buttocks, well below the area of the SI joint.  The figure 4 maneuver did not trigger pain and the area of her SI joint.  Reviewing her chart, I was unable to find the last time that we did any injections into her hip.  She has had 2 SI joint injections, but none in the hip area since 2016.  Furthermore, I looked at care everywhere him in the [facility] and she does not seem to have any MRIs or x-rays of her right hip.  Today we will be ordering an x-ray of her right hip since she is having decreased range of motion and pain.  In addition to this, she indicates feeling weak and not doing very well.  Lab work would suggest that she  is currently having an anemia and iron deficiency.  She describes having a history of prior events where she had an iron deficiency anemia, specifically when she was pregnant.  She indicates trying to take over-the-counter iron, but it causes her to have diarrhea.  Unfortunately she is not really absorbing any of that I run if that is the case.  I have suggested that she go back to her primary care physician and talk to them about the possibility of having some injections.  I also suggested trying to use a different type of iron formulation.  In any case, I have offered the patient to do an intra-articular injection in the area of the right hip, which she has accepted.  I will be bringing her back to do this injection, once she has the x-rays of the hip to see if there is anything else that we need to worry about.  Further details on both, my assessment(s), as well as the proposed treatment plan, please see below.  Post-Procedure Assessment  06/29/2018 Procedure: Palliative left-sided L4-5 interlaminar LESI under fluoroscopic guidance, no sedation  Pre-procedure pain score:  7/10 Post-procedure pain score: 0/10 (100% relief) Influential Factors: BMI: 31.09 kg/m Intra-procedural challenges: None observed.         Assessment challenges: None detected.              Reported side-effects: None.        Post-procedural adverse reactions or complications: None reported         Sedation: No sedation used. When no sedatives are used, the analgesic levels obtained are directly associated to the effectiveness of the local anesthetics. However, when sedation is provided, the level of analgesia obtained during the initial 1 hour following  the intervention, is believed to be the result of a combination of factors. These factors may include, but are not limited to: 1. The effectiveness of the local anesthetics used. 2. The effects of the analgesic(s) and/or anxiolytic(s) used. 3. The degree of discomfort  experienced by the patient at the time of the procedure. 4. The patients ability and reliability in recalling and recording the events. 5. The presence and influence of possible secondary gains and/or psychosocial factors. Reported result: Relief experienced during the 1st hour after the procedure: 50 % (Ultra-Short Term Relief)            Interpretative annotation: Clinically appropriate result. No IV Analgesic or Anxiolytic given, therefore benefits are completely due to Local Anesthetic effects.          Effects of local anesthetic: The analgesic effects attained during this period are directly associated to the localized infiltration of local anesthetics and therefore cary significant diagnostic value as to the etiological location, or anatomical origin, of the pain. Expected duration of relief is directly dependent on the pharmacodynamics of the local anesthetic used. Long-acting (4-6 hours) anesthetics used.  Reported result: Relief during the next 4 to 6 hour after the procedure: 100 % (Short-Term Relief)            Interpretative annotation: Clinically appropriate result. Analgesia during this period is likely to be Local Anesthetic-related.          Long-term benefit: Defined as the period of time past the expected duration of local anesthetics (1 hour for short-acting and 4-6 hours for long-acting). With the possible exception of prolonged sympathetic blockade from the local anesthetics, benefits during this period are typically attributed to, or associated with, other factors such as analgesic sensory neuropraxia, antiinflammatory effects, or beneficial biochemical changes provided by agents other than the local anesthetics.  Reported result: Extended relief following procedure: 100 % (Long-Term Relief)            Interpretative annotation: Clinically possible results. Good relief. No permanent benefit expected. Inflammation plays a part in the etiology to the pain.          Current  benefits: Defined as reported results that persistent at this point in time.   Analgesia: 100 % Ms. Heidelberger reports improvement of axial and dermatomal symptoms.  Her left-sided low back and dermatomal symptoms are completely gone.  Today she is complaining of right-sided hip joint pain. Function: Ms. Morace reports improvement in function ROM: Ms. Viall reports improvement in ROM Interpretative annotation: Ongoing benefit. Therapeutic success. Effective therapeutic approach.          Interpretation: Results would suggest a successful diagnostic intervention.                  Plan:  Re-assessment of algesic etiology. It would seem that her right sided buttocks pain is in fact coming from her hip. Physical exam today would suggest the right hip to be involved in her current symptoms.  Laboratory Chemistry  Inflammation Markers (CRP: Acute Phase) (ESR: Chronic Phase) Lab Results  Component Value Date   CRP 1 06/09/2018   ESRSEDRATE 37 06/09/2018                         Renal Markers Lab Results  Component Value Date   BUN 13 06/24/2016   CREATININE 0.95 06/24/2016   GFRAA >60 06/24/2016   GFRNONAA 55 (L) 06/24/2016  Hepatic Markers Lab Results  Component Value Date   AST 17 06/24/2016   ALT 11 (L) 06/24/2016   ALBUMIN 3.9 06/24/2016                        Neuropathy Markers Lab Results  Component Value Date   VITAMINB12 240 06/24/2016                        Hematology Parameters Lab Results  Component Value Date   INR 0.96 06/24/2016   LABPROT 12.8 06/24/2016   PLT 291 06/24/2016   HGB 12.7 06/24/2016   HCT 38.6 06/24/2016                        CV Markers Lab Results  Component Value Date   TROPONINI < 0.02 04/27/2014                         Note: Lab results reviewed.  Recent Imaging Results   Results for orders placed in visit on 06/29/18  DG C-Arm 1-60 Min-No Report   Narrative Fluoroscopy was utilized by the requesting physician.   No radiographic  interpretation.    Interpretation Report: Fluoroscopy was used during the procedure to assist with needle guidance. The images were interpreted intraoperatively by the requesting physician.  Meds   Current Outpatient Medications:  .  acetaminophen (TYLENOL) 325 MG tablet, Take 650 mg by mouth every 6 (six) hours as needed., Disp: , Rfl:  .  buPROPion (WELLBUTRIN SR) 150 MG 12 hr tablet, TAKE 1 TABLET BY MOUTH ONCE DAILY, Disp: , Rfl:  .  dorzolamide-timolol (COSOPT) 22.3-6.8 MG/ML ophthalmic solution, INT 1 GTT IN OU BID, Disp: , Rfl: 5 .  doxepin (SINEQUAN) 100 MG capsule, Take 100 mg by mouth at bedtime., Disp: , Rfl:  .  meclizine (ANTIVERT) 12.5 MG tablet, Take 1 tablet by mouth daily as needed., Disp: , Rfl: 1 .  Multiple Vitamin (MULTIVITAMIN) tablet, Take 1 tablet by mouth daily., Disp: , Rfl:  .  [START ON 08/08/2018] oxyCODONE (OXY IR/ROXICODONE) 5 MG immediate release tablet, Take 1 tablet (5 mg total) by mouth every 12 (twelve) hours as needed for severe pain., Disp: 60 tablet, Rfl: 0 .  oxyCODONE (OXY IR/ROXICODONE) 5 MG immediate release tablet, Take 1 tablet (5 mg total) by mouth every 12 (twelve) hours as needed for severe pain., Disp: 60 tablet, Rfl: 0 .  oxyCODONE (OXY IR/ROXICODONE) 5 MG immediate release tablet, Take 1 tablet (5 mg total) by mouth every 12 (twelve) hours as needed for severe pain., Disp: 60 tablet, Rfl: 0 .  pantoprazole (PROTONIX) 40 MG tablet, Take 1 tablet by mouth 2 (two) times daily., Disp: , Rfl: 11 .  sertraline (ZOLOFT) 100 MG tablet, Take 1 tablet by mouth every morning., Disp: , Rfl:  .  zolpidem (AMBIEN) 10 MG tablet, TAKE 1/2 TO 1 TABLET BY MOUTH EVERY NIGHT AT BEDTIME, Disp: , Rfl:   ROS  Constitutional: Denies any fever or chills Gastrointestinal: No reported hemesis, hematochezia, vomiting, or acute GI distress Musculoskeletal: Denies any acute onset joint swelling, redness, loss of ROM, or weakness Neurological: No reported  episodes of acute onset apraxia, aphasia, dysarthria, agnosia, amnesia, paralysis, loss of coordination, or loss of consciousness  Allergies  Ms. Reno is allergic to ibuprofen and nsaids.  PFSH  Drug: Ms. Deahl  reports that she does not use  drugs. Alcohol:  reports that she does not drink alcohol. Tobacco:  reports that she has quit smoking. She has never used smokeless tobacco. Medical:  has a past medical history of Dizzy spells, Fibromyalgia, Glaucoma, Hiatal hernia, Osteoarthritis, Osteoarthritis of spine with radiculopathy, lumbar region (08/14/2015), Peptic ulcer disease, Reflux, Spinal stenosis, Stroke ([place]), and TIA (transient ischemic attack). Surgical: Ms. Shutters  has a past surgical history that includes Esophagogastroduodenoscopy (N/A, 04/16/2015); Appendectomy; and Abdominal hysterectomy. Family: family history includes COPD in her father; Mental illness in her mother.  Constitutional Exam  General appearance: Well nourished, well developed, and well hydrated. In no apparent acute distress Vitals:   07/19/18 1324  BP: (!) 160/74  Pulse: 90  Resp: 18  Temp: 98.1 F (36.7 C)  TempSrc: Oral  SpO2: 99%  Weight: 170 lb (77.1 kg)  Height: _0  (1.575 m)   BMI Assessment: Estimated body mass index is 31.09 kg/m as calculated from the following:   Height as of this encounter: _1  (1.575 m).   Weight as of this encounter: 170 lb (77.1 kg).  BMI interpretation table: BMI level Category Range association with higher incidence of chronic pain  <18 kg/m2 Underweight   18.5-24.9 kg/m2 Ideal body weight   25-29.9 kg/m2 Overweight Increased incidence by 20%  30-34.9 kg/m2 Obese (Class I) Increased incidence by 68%  35-39.9 kg/m2 Severe obesity (Class II) Increased incidence by 136%  >40 kg/m2 Extreme obesity (Class III) Increased incidence by 254%   Patient's current BMI Ideal Body weight  Body mass index is 31.09 kg/m. Ideal body weight: 50.1 kg (110 lb 7.2 oz) Adjusted ideal  body weight: 60.9 kg (134 lb 4.3 oz)   BMI Readings from Last 4 Encounters:  07/19/18 31.09 kg/m  06/29/18 31.09 kg/m  06/09/18 31.09 kg/m  05/25/18 31.09 kg/m   Wt Readings from Last 4 Encounters:  07/19/18 170 lb (77.1 kg)  06/29/18 170 lb (77.1 kg)  06/09/18 170 lb (77.1 kg)  05/25/18 170 lb (77.1 kg)  Psych/Mental status: Alert, oriented x 3 (person, place, & time)       Eyes: PERLA Respiratory: No evidence of acute respiratory distress  Cervical Spine Area Exam  Skin & Axial Inspection: No masses, redness, edema, swelling, or associated skin lesions Alignment: Symmetrical Functional ROM: Unrestricted ROM      Stability: No instability detected Muscle Tone/Strength: Functionally intact. No obvious neuro-muscular anomalies detected. Sensory (Neurological): Unimpaired Palpation: No palpable anomalies              Upper Extremity (UE) Exam    Side: Right upper extremity  Side: Left upper extremity  Skin & Extremity Inspection: Skin color, temperature, and hair growth are WNL. No peripheral edema or cyanosis. No masses, redness, swelling, asymmetry, or associated skin lesions. No contractures.  Skin & Extremity Inspection: Skin color, temperature, and hair growth are WNL. No peripheral edema or cyanosis. No masses, redness, swelling, asymmetry, or associated skin lesions. No contractures.  Functional ROM: Unrestricted ROM          Functional ROM: Unrestricted ROM          Muscle Tone/Strength: Functionally intact. No obvious neuro-muscular anomalies detected.  Muscle Tone/Strength: Functionally intact. No obvious neuro-muscular anomalies detected.  Sensory (Neurological): Unimpaired          Sensory (Neurological): Unimpaired          Palpation: No palpable anomalies              Palpation: No palpable anomalies  Provocative Test(s):  Phalen's test: deferred Tinel's test: deferred Apley's scratch test (touch opposite shoulder):  Action 1 (Across chest):  deferred Action 2 (Overhead): deferred Action 3 (LB reach): deferred   Provocative Test(s):  Phalen's test: deferred Tinel's test: deferred Apley's scratch test (touch opposite shoulder):  Action 1 (Across chest): deferred Action 2 (Overhead): deferred Action 3 (LB reach): deferred    Thoracic Spine Area Exam  Skin & Axial Inspection: No masses, redness, or swelling Alignment: Symmetrical Functional ROM: Unrestricted ROM Stability: No instability detected Muscle Tone/Strength: Functionally intact. No obvious neuro-muscular anomalies detected. Sensory (Neurological): Unimpaired Muscle strength & Tone: No palpable anomalies  Lumbar Spine Area Exam  Skin & Axial Inspection: No masses, redness, or swelling Alignment: Symmetrical Functional ROM: Improved after treatment       Stability: No instability detected Muscle Tone/Strength: Functionally intact. No obvious neuro-muscular anomalies detected. Sensory (Neurological): Unimpaired Palpation: No palpable anomalies       Provocative Tests: Hyperextension/rotation test: deferred today       Lumbar quadrant test (Kemp's test): deferred today       Lateral bending test: deferred today       Patrick's Maneuver: (+) for right hip arthralgia             FABER test: (+) for right hip arthralgia             S-I anterior distraction/compression test: deferred today         S-I lateral compression test: deferred today         S-I Thigh-thrust test: deferred today         S-I Gaenslen's test: deferred today          Gait & Posture Assessment  Ambulation: Patient ambulates using a walker Gait: Limited. Using assistive device to ambulate Posture: Difficulty standing up straight, due to pain   Lower Extremity Exam    Side: Right lower extremity  Side: Left lower extremity  Stability: No instability observed          Stability: No instability observed          Skin & Extremity Inspection: Skin color, temperature, and hair growth are WNL.  No peripheral edema or cyanosis. No masses, redness, swelling, asymmetry, or associated skin lesions. No contractures.  Skin & Extremity Inspection: Skin color, temperature, and hair growth are WNL. No peripheral edema or cyanosis. No masses, redness, swelling, asymmetry, or associated skin lesions. No contractures.  Functional ROM: Decreased ROM for hip joint          Functional ROM: Unrestricted ROM                  Muscle Tone/Strength: Functionally intact. No obvious neuro-muscular anomalies detected.  Muscle Tone/Strength: Functionally intact. No obvious neuro-muscular anomalies detected.  Sensory (Neurological): Arthropathic arthralgia  Sensory (Neurological): Unimpaired  Palpation: No palpable anomalies  Palpation: No palpable anomalies   Assessment  Primary Diagnosis & Pertinent Problem List: The primary encounter diagnosis was Chronic low back pain (Primary Source of Pain) (Bilateral) (L>R). Diagnoses of Chronic lower extremity pain (Secondary source of pain) (Left) and Chronic hip pain (Right) were also pertinent to this visit.  Status Diagnosis  Resolved Resolved Worsened 1. Chronic low back pain (Primary Source of Pain) (Bilateral) (L>R)   2. Chronic lower extremity pain (Secondary source of pain) (Left)   3. Chronic hip pain (Right)     Problems updated and reviewed during this visit: No problems updated. Plan of Care  Pharmacotherapy (Medications  Ordered): No orders of the defined types were placed in this encounter.  Medications administered today: Letta Median B. Soderholm had no medications administered during this visit.   Procedure Orders     HIP INJECTION Lab Orders  No laboratory test(s) ordered today    Imaging Orders     DG HIP UNILAT W OR W/O PELVIS 2-3 VIEWS RIGHT Referral Orders  No referral(s) requested today    Interventional management options: Planned, scheduled, and/or pending:   Diagnostic Right IA Hip joint injection #1 under fluoro, no sedation    Considering:   Diagnostic bilateral lumbar facet block Possible bilateral lumbar facet RFA Diagnostic bilateral sacroiliac joint block Possible bilateral sacroiliac joint RFA Diagnostic left L4-5 transforaminal LESI Palliative left L4-5 interlaminar LESI Diagnostic bilateral intra-articular hip joint injection Diagnostic bilateral femoral nerve + obturator nerve block Possible bilateral femoral nerve + obturator nerve RFA Palliative left AC joint injection + MNB (L) Biceps muscle.   Palliative PRN treatment(s):   Palliative left-sided L4-5Interlaminar LESI PalliativeL5-S1interlaminarLESI Palliative left intra-articular shoulder joint injection Palliative right sacroiliac joint block   Provider-requested follow-up: Return for Procedure (no sedation): (R) IA Hip inj #1.  Future Appointments  Date Time Provider [place]  07/27/2018  8:30 AM Milinda Pointer, MD [facility] None   Primary Care Physician: [name], MD Location: [facility] Facility Note by: Gaspar Cola, MD Date: 07/19/2018; Time: 2:14 PM

## 2018-07-19 ENCOUNTER — Ambulatory Visit
Admission: RE | Admit: 2018-07-19 | Discharge: 2018-07-19 | Disposition: A | Discharge status: Home / Self Care | Payer: PPO | Source: Ambulatory Visit | Attending: Pain Medicine | Admitting: Pain Medicine

## 2018-07-19 ENCOUNTER — Ambulatory Visit (HOSPITAL_BASED_OUTPATIENT_CLINIC_OR_DEPARTMENT_OTHER): Payer: PPO | Admitting: Pain Medicine

## 2018-07-19 ENCOUNTER — Other Ambulatory Visit: Payer: Self-pay

## 2018-07-19 ENCOUNTER — Encounter: Payer: Self-pay | Admitting: Pain Medicine

## 2018-07-19 VITALS — BP 160/74 | HR 90 | Temp 98.1°F | Resp 18 | Ht 62.0 in | Wt 170.0 lb

## 2018-07-19 DIAGNOSIS — M545 Low back pain, unspecified: Secondary | ICD-10-CM

## 2018-07-19 DIAGNOSIS — M4312 Spondylolisthesis, cervical region: Secondary | ICD-10-CM | POA: Diagnosis not present

## 2018-07-19 DIAGNOSIS — M4726 Other spondylosis with radiculopathy, lumbar region: Secondary | ICD-10-CM | POA: Diagnosis not present

## 2018-07-19 DIAGNOSIS — M19012 Primary osteoarthritis, left shoulder: Secondary | ICD-10-CM | POA: Diagnosis not present

## 2018-07-19 DIAGNOSIS — M5116 Intervertebral disc disorders with radiculopathy, lumbar region: Secondary | ICD-10-CM | POA: Insufficient documentation

## 2018-07-19 DIAGNOSIS — G8929 Other chronic pain: Secondary | ICD-10-CM

## 2018-07-19 DIAGNOSIS — M48061 Spinal stenosis, lumbar region without neurogenic claudication: Secondary | ICD-10-CM | POA: Diagnosis not present

## 2018-07-19 DIAGNOSIS — N183 Chronic kidney disease, stage 3 (moderate): Secondary | ICD-10-CM | POA: Diagnosis not present

## 2018-07-19 DIAGNOSIS — M25551 Pain in right hip: Secondary | ICD-10-CM | POA: Insufficient documentation

## 2018-07-19 DIAGNOSIS — Z5181 Encounter for therapeutic drug level monitoring: Secondary | ICD-10-CM | POA: Insufficient documentation

## 2018-07-19 DIAGNOSIS — M79605 Pain in left leg: Secondary | ICD-10-CM

## 2018-07-19 DIAGNOSIS — Z79891 Long term (current) use of opiate analgesic: Secondary | ICD-10-CM | POA: Diagnosis not present

## 2018-07-19 DIAGNOSIS — H409 Unspecified glaucoma: Secondary | ICD-10-CM | POA: Insufficient documentation

## 2018-07-19 DIAGNOSIS — M797 Fibromyalgia: Secondary | ICD-10-CM | POA: Insufficient documentation

## 2018-07-19 DIAGNOSIS — M4316 Spondylolisthesis, lumbar region: Secondary | ICD-10-CM | POA: Insufficient documentation

## 2018-07-19 DIAGNOSIS — Z87891 Personal history of nicotine dependence: Secondary | ICD-10-CM | POA: Diagnosis not present

## 2018-07-19 DIAGNOSIS — K219 Gastro-esophageal reflux disease without esophagitis: Secondary | ICD-10-CM | POA: Diagnosis not present

## 2018-07-19 DIAGNOSIS — M1611 Unilateral primary osteoarthritis, right hip: Secondary | ICD-10-CM | POA: Diagnosis not present

## 2018-07-19 DIAGNOSIS — Z8673 Personal history of transient ischemic attack (TIA), and cerebral infarction without residual deficits: Secondary | ICD-10-CM | POA: Insufficient documentation

## 2018-07-19 DIAGNOSIS — Z79899 Other long term (current) drug therapy: Secondary | ICD-10-CM | POA: Insufficient documentation

## 2018-07-19 DIAGNOSIS — M47817 Spondylosis without myelopathy or radiculopathy, lumbosacral region: Secondary | ICD-10-CM | POA: Diagnosis not present

## 2018-07-19 DIAGNOSIS — Z90710 Acquired absence of both cervix and uterus: Secondary | ICD-10-CM | POA: Insufficient documentation

## 2018-07-19 DIAGNOSIS — G894 Chronic pain syndrome: Secondary | ICD-10-CM | POA: Diagnosis not present

## 2018-07-19 DIAGNOSIS — M47812 Spondylosis without myelopathy or radiculopathy, cervical region: Secondary | ICD-10-CM | POA: Diagnosis not present

## 2018-07-19 DIAGNOSIS — M1612 Unilateral primary osteoarthritis, left hip: Secondary | ICD-10-CM | POA: Diagnosis not present

## 2018-07-19 DIAGNOSIS — Z8711 Personal history of peptic ulcer disease: Secondary | ICD-10-CM | POA: Insufficient documentation

## 2018-07-19 DIAGNOSIS — Z886 Allergy status to analgesic agent status: Secondary | ICD-10-CM | POA: Insufficient documentation

## 2018-07-19 DIAGNOSIS — Z825 Family history of asthma and other chronic lower respiratory diseases: Secondary | ICD-10-CM | POA: Insufficient documentation

## 2018-07-19 DIAGNOSIS — E785 Hyperlipidemia, unspecified: Secondary | ICD-10-CM | POA: Insufficient documentation

## 2018-07-19 DIAGNOSIS — Z818 Family history of other mental and behavioral disorders: Secondary | ICD-10-CM | POA: Insufficient documentation

## 2018-07-19 NOTE — Progress Notes (Signed)
Safety precautions to be maintained throughout the outpatient stay will include: orient to surroundings, keep bed in low position, maintain call bell within reach at all times, provide assistance with transfer out of bed and ambulation.  

## 2018-07-19 NOTE — Patient Instructions (Addendum)
____________________________________________________________________________________________  Preparing for your procedure (without sedation)  Instructions: . Oral Intake: Do not eat or drink anything for at least 3 hours prior to your procedure. . Transportation: Unless otherwise stated by your physician, you may drive yourself after the procedure. . Blood Pressure Medicine: Take your blood pressure medicine with a sip of water the morning of the procedure. . Blood thinners: Notify our staff if you are taking any blood thinners. Depending on which one you take, there will be specific instructions on how and when to stop it. . Diabetics on insulin: Notify the staff so that you can be scheduled 1st case in the morning. If your diabetes requires high dose insulin, take only  of your normal insulin dose the morning of the procedure and notify the staff that you have done so. . Preventing infections: Shower with an antibacterial soap the morning of your procedure.  . Build-up your immune system: Take 1000 mg of Vitamin C with every meal (3 times a day) the day prior to your procedure. Marland Kitchen Antibiotics: Inform the staff if you have a condition or reason that requires you to take antibiotics before dental procedures. . Pregnancy: If you are pregnant, call and cancel the procedure. . Sickness: If you have a cold, fever, or any active infections, call and cancel the procedure. . Arrival: You must be in the facility at least 30 minutes prior to your scheduled procedure. . Children: Do not bring any children with you. . Dress appropriately: Bring dark clothing that you would not mind if they get stained. . Valuables: Do not bring any jewelry or valuables.  Procedure appointments are reserved for interventional treatments only. Marland Kitchen No Prescription Refills. . No medication changes will be discussed during procedure appointments. . No disability issues will be discussed.  Reasons to call and reschedule or  cancel your procedure: (Following these recommendations will minimize the risk of a serious complication.) . Surgeries: Avoid having procedures within 2 weeks of any surgery. (Avoid for 2 weeks before or after any surgery). . Flu Shots: Avoid having procedures within 2 weeks of a flu shots or . (Avoid for 2 weeks before or after immunizations). . Barium: Avoid having a procedure within 7-10 days after having had a radiological study involving the use of radiological contrast. (Myelograms, Barium swallow or enema study). . Heart attacks: Avoid any elective procedures or surgeries for the initial 6 months after a "Myocardial Infarction" (Heart Attack). . Blood thinners: It is imperative that you stop these medications before procedures. Let us know if you if you take any blood thinner.  . Infection: Avoid procedures during or within two weeks of an infection (including chest colds or gastrointestinal problems). Symptoms associated with infections include: Localized redness, fever, chills, night sweats or profuse sweating, burning sensation when voiding, cough, congestion, stuffiness, runny nose, sore throat, diarrhea, nausea, vomiting, cold or Flu symptoms, recent or current infections. It is specially important if the infection is over the area that we intend to treat. Marland Kitchen Heart and lung problems: Symptoms that may suggest an active cardiopulmonary problem include: cough, chest pain, breathing difficulties or shortness of breath, dizziness, ankle swelling, uncontrolled high or unusually low blood pressure, and/or palpitations. If you are experiencing any of these symptoms, cancel your procedure and contact your primary care physician for an evaluation.  Remember:  Regular Business hours are:  Monday to Thursday 8:00 AM to 4:00 PM  Provider's Schedule: Milinda Pointer, MD:  Procedure days: Tuesday and Thursday 7:30 AM to  4:00 PM  Gillis Santa, MD:  Procedure days: Monday and Wednesday 7:30 AM to 4:00  PM ____________________________________________________________________________________________   GENERAL RISKS AND COMPLICATIONS  What are the risk, side effects and possible complications? Generally speaking, most procedures are safe.  However, with any procedure there are risks, side effects, and the possibility of complications.  The risks and complications are dependent upon the sites that are lesioned, or the type of nerve block to be performed.  The closer the procedure is to the spine, the more serious the risks are.  Great care is taken when placing the radio frequency needles, block needles or lesioning probes, but sometimes complications can occur. 1. Infection: Any time there is an injection through the skin, there is a risk of infection.  This is why sterile conditions are used for these blocks.  There are four possible types of infection. 1. Localized skin infection. 2. Central Nervous System Infection-This can be in the form of Meningitis, which can be deadly. 3. Epidural Infections-This can be in the form of an epidural abscess, which can cause pressure inside of the spine, causing compression of the spinal cord with subsequent paralysis. This would require an emergency surgery to decompress, and there are no guarantees that the patient would recover from the paralysis. 4. Discitis-This is an infection of the intervertebral discs.  It occurs in about 1% of discography procedures.  It is difficult to treat and it may lead to surgery.        2. Pain: the needles have to go through skin and soft tissues, will cause soreness.       3. Damage to internal structures:  The nerves to be lesioned may be near blood vessels or    other nerves which can be potentially damaged.       4. Bleeding: Bleeding is more common if the patient is taking blood thinners such as  aspirin, Coumadin, Ticiid, Plavix, etc., or if he/she have some genetic predisposition  such as hemophilia. Bleeding into the  spinal canal can cause compression of the spinal  cord with subsequent paralysis.  This would require an emergency surgery to  decompress and there are no guarantees that the patient would recover from the  paralysis.       5. Pneumothorax:  Puncturing of a lung is a possibility, every time a needle is introduced in  the area of the chest or upper back.  Pneumothorax refers to free air around the  collapsed lung(s), inside of the thoracic cavity (chest cavity).  Another two possible  complications related to a similar event would include: Hemothorax and Chylothorax.   These are variations of the Pneumothorax, where instead of air around the collapsed  lung(s), you may have blood or chyle, respectively.       6. Spinal headaches: They may occur with any procedures in the area of the spine.       7. Persistent CSF (Cerebro-Spinal Fluid) leakage: This is a rare problem, but may occur  with prolonged intrathecal or epidural catheters either due to the formation of a fistulous  track or a dural tear.       8. Nerve damage: By working so close to the spinal cord, there is always a possibility of  nerve damage, which could be as serious as a permanent spinal cord injury with  paralysis.       9. Death:  Although rare, severe deadly allergic reactions known as "Anaphylactic  reaction" can occur to any  of the medications used.      10. Worsening of the symptoms:  We can always make thing worse.  What are the chances of something like this happening? Chances of any of this occuring are extremely low.  By statistics, you have more of a chance of getting killed in a motor vehicle accident: while driving to the hospital than any of the above occurring .  Nevertheless, you should be aware that they are possibilities.  In general, it is similar to taking a shower.  Everybody knows that you can slip, hit your head and get killed.  Does that mean that you should not shower again?  Nevertheless always keep in mind that  statistics do not mean anything if you happen to be on the wrong side of them.  Even if a procedure has a 1 (one) in a 1,000,000 (million) chance of going wrong, it you happen to be that one..Also, keep in mind that by statistics, you have more of a chance of having something go wrong when taking medications.  Who should not have this procedure? If you are on a blood thinning medication (e.g. Coumadin, Plavix, see list of "Blood Thinners"), or if you have an active infection going on, you should not have the procedure.  If you are taking any blood thinners, please inform your physician.  How should I prepare for this procedure?  Do not eat or drink anything at least six hours prior to the procedure.  Bring a driver with you .  It cannot be a taxi.  Come accompanied by an adult that can drive you back, and that is strong enough to help you if your legs get weak or numb from the local anesthetic.  Take all of your medicines the morning of the procedure with just enough water to swallow them.  If you have diabetes, make sure that you are scheduled to have your procedure done first thing in the morning, whenever possible.  If you have diabetes, take only half of your insulin dose and notify our nurse that you have done so as soon as you arrive at the clinic.  If you are diabetic, but only take blood sugar pills (oral hypoglycemic), then do not take them on the morning of your procedure.  You may take them after you have had the procedure.  Do not take aspirin or any aspirin-containing medications, at least eleven (11) days prior to the procedure.  They may prolong bleeding.  Wear loose fitting clothing that may be easy to take off and that you would not mind if it got stained with Betadine or blood.  Do not wear any jewelry or perfume  Remove any nail coloring.  It will interfere with some of our monitoring equipment.  NOTE: Remember that this is not meant to be interpreted as a complete  list of all possible complications.  Unforeseen problems may occur.  BLOOD THINNERS The following drugs contain aspirin or other products, which can cause increased bleeding during surgery and should not be taken for 2 weeks prior to and 1 week after surgery.  If you should need take something for relief of minor pain, you may take acetaminophen which is found in Tylenol,m Datril, Anacin-3 and Panadol. It is not blood thinner. The products listed below are.  Do not take any of the products listed below in addition to any listed on your instruction sheet.  A.P.C or A.P.C with Codeine Codeine Phosphate Capsules #3 Ibuprofen Ridaura  ABC compound  Congesprin Imuran rimadil  Advil Cope Indocin Robaxisal  Alka-Seltzer Effervescent Pain Reliever and Antacid Coricidin or Coricidin-D  Indomethacin Rufen  Alka-Seltzer plus Cold Medicine Cosprin Ketoprofen S-A-C Tablets  Anacin Analgesic Tablets or Capsules Coumadin Korlgesic Salflex  Anacin Extra Strength Analgesic tablets or capsules CP-2 Tablets Lanoril Salicylate  Anaprox Cuprimine Capsules Levenox Salocol  Anexsia-D Dalteparin Magan Salsalate  Anodynos Darvon compound Magnesium Salicylate Sine-off  Ansaid Dasin Capsules Magsal Sodium Salicylate  Anturane Depen Capsules Marnal Soma  APF Arthritis pain formula Dewitt's Pills Measurin Stanback  Argesic Dia-Gesic Meclofenamic Sulfinpyrazone  Arthritis Bayer Timed Release Aspirin Diclofenac Meclomen Sulindac  Arthritis pain formula Anacin Dicumarol Medipren Supac  Analgesic (Safety coated) Arthralgen Diffunasal Mefanamic Suprofen  Arthritis Strength Bufferin Dihydrocodeine Mepro Compound Suprol  Arthropan liquid Dopirydamole Methcarbomol with Aspirin Synalgos  ASA tablets/Enseals Disalcid Micrainin Tagament  Ascriptin Doan's Midol Talwin  Ascriptin A/D Dolene Mobidin Tanderil  Ascriptin Extra Strength Dolobid Moblgesic Ticlid  Ascriptin with Codeine Doloprin or Doloprin with Codeine Momentum  Tolectin  Asperbuf Duoprin Mono-gesic Trendar  Aspergum Duradyne Motrin or Motrin IB Triminicin  Aspirin plain, buffered or enteric coated Durasal Myochrisine Trigesic  Aspirin Suppositories Easprin Nalfon Trillsate  Aspirin with Codeine Ecotrin Regular or Extra Strength Naprosyn Uracel  Atromid-S Efficin Naproxen Ursinus  Auranofin Capsules Elmiron Neocylate Vanquish  Axotal Emagrin Norgesic Verin  Azathioprine Empirin or Empirin with Codeine Normiflo Vitamin E  Azolid Emprazil Nuprin Voltaren  Bayer Aspirin plain, buffered or children's or timed BC Tablets or powders Encaprin Orgaran Warfarin Sodium  Buff-a-Comp Enoxaparin Orudis Zorpin  Buff-a-Comp with Codeine Equegesic Os-Cal-Gesic   Buffaprin Excedrin plain, buffered or Extra Strength Oxalid   Bufferin Arthritis Strength Feldene Oxphenbutazone   Bufferin plain or Extra Strength Feldene Capsules Oxycodone with Aspirin   Bufferin with Codeine Fenoprofen Fenoprofen Pabalate or Pabalate-SF   Buffets II Flogesic Panagesic   Buffinol plain or Extra Strength Florinal or Florinal with Codeine Panwarfarin   Buf-Tabs Flurbiprofen Penicillamine   Butalbital Compound Four-way cold tablets Penicillin   Butazolidin Fragmin Pepto-Bismol   Carbenicillin Geminisyn Percodan   Carna Arthritis Reliever Geopen Persantine   Carprofen Gold's salt Persistin   Chloramphenicol Goody's Phenylbutazone   Chloromycetin Haltrain Piroxlcam   Clmetidine heparin Plaquenil   Cllnoril Hyco-pap Ponstel   Clofibrate Hydroxy chloroquine Propoxyphen         Before stopping any of these medications, be sure to consult the physician who ordered them.  Some, such as Coumadin (Warfarin) are ordered to prevent or treat serious conditions such as "deep thrombosis", "pumonary embolisms", and other heart problems.  The amount of time that you may need off of the medication may also vary with the medication and the reason for which you were taking it.  If you are taking any  of these medications, please make sure you notify your pain physician before you undergo any procedures.         Sacroiliac (SI) Joint Injection Patient Information  Description: The sacroiliac joint connects the scrum (very low back and tailbone) to the ilium (a pelvic bone which also forms half of the hip joint).  Normally this joint experiences very little motion.  When this joint becomes inflamed or unstable low back and or hip and pelvis pain may result.  Injection of this joint with local anesthetics (numbing medicines) and steroids can provide diagnostic information and reduce pain.  This injection is performed with the aid of x-ray guidance into the tailbone area while you are lying on your stomach.  You may experience an electrical sensation down the leg while this is being done.  You may also experience numbness.  We also may ask if we are reproducing your normal pain during the injection.  Conditions which may be treated SI injection:   Low back, buttock, hip or leg pain  Preparation for the Injection:  1. Do not eat any solid food or dairy products within 8 hours of your appointment.  2. You may drink clear liquids up to 3 hours before appointment.  Clear liquids include water, black coffee, juice or soda.  No milk or cream please. 3. You may take your regular medications, including pain medications with a sip of water before your appointment.  Diabetics should hold regular insulin (if take separately) and take 1/2 normal NPH dose the morning of the procedure.  Carry some sugar containing items with you to your appointment. 4. A driver must accompany you and be prepared to drive you home after your procedure. 5. Bring all of your current medications with you. 6. An IV may be inserted and sedation may be given at the discretion of the physician. 7. A blood pressure cuff, EKG and other monitors will often be applied during the procedure.  Some patients may need to have extra  oxygen administered for a short period.  8. You will be asked to provide medical information, including your allergies, prior to the procedure.  We must know immediately if you are taking blood thinners (like Coumadin/Warfarin) or if you are allergic to IV iodine contrast (dye).  We must know if you could possible be pregnant.  Possible side effects:   Bleeding from needle site  Infection (rare, may require surgery)  Nerve injury (rare)  Numbness & tingling (temporary)  A brief convulsion or seizure  Light-headedness (temporary)  Pain at injection site (several days)  Decreased blood pressure (temporary)  Weakness in the leg (temporary)   Call if you experience:   New onset weakness or numbness of an extremity below the injection site that last more than 8 hours.  Hives or difficulty breathing ( go to the emergency room)  Inflammation or drainage at the injection site  Any new symptoms which are concerning to you  Please note:  Although the local anesthetic injected can often make your back/ hip/ buttock/ leg feel good for several hours after the injections, the pain will likely return.  It takes 3-7 days for steroids to work in the sacroiliac area.  You may not notice any pain relief for at least that one week.  If effective, we will often do a series of three injections spaced 3-6 weeks apart to maximally decrease your pain.  After the initial series, we generally will wait some months before a repeat injection of the same type.  If you have any questions, please call [phone] [facility]

## 2018-07-27 ENCOUNTER — Ambulatory Visit: Payer: PPO | Attending: Pain Medicine | Admitting: Pain Medicine

## 2018-07-27 ENCOUNTER — Ambulatory Visit: Admission: RE | Admit: 2018-07-27 | Payer: PPO | Source: Ambulatory Visit

## 2018-07-27 ENCOUNTER — Encounter: Payer: Self-pay | Admitting: Pain Medicine

## 2018-07-27 ENCOUNTER — Other Ambulatory Visit: Payer: Self-pay

## 2018-07-27 VITALS — BP 111/86 | HR 78 | Temp 98.4°F | Resp 18 | Ht 62.0 in | Wt 170.0 lb

## 2018-07-27 DIAGNOSIS — M545 Low back pain: Secondary | ICD-10-CM | POA: Diagnosis not present

## 2018-07-27 DIAGNOSIS — M1611 Unilateral primary osteoarthritis, right hip: Secondary | ICD-10-CM | POA: Insufficient documentation

## 2018-07-27 DIAGNOSIS — M7918 Myalgia, other site: Secondary | ICD-10-CM | POA: Insufficient documentation

## 2018-07-27 DIAGNOSIS — M549 Dorsalgia, unspecified: Secondary | ICD-10-CM

## 2018-07-27 DIAGNOSIS — G8929 Other chronic pain: Secondary | ICD-10-CM | POA: Diagnosis not present

## 2018-07-27 DIAGNOSIS — Z886 Allergy status to analgesic agent status: Secondary | ICD-10-CM | POA: Diagnosis not present

## 2018-07-27 DIAGNOSIS — M25551 Pain in right hip: Secondary | ICD-10-CM | POA: Insufficient documentation

## 2018-07-27 MED ORDER — ROPIVACAINE HCL 2 MG/ML IJ SOLN
4.0000 mL | Freq: Once | INTRAMUSCULAR | Status: AC
  Administered 2018-07-27: 10 mL via INTRA_ARTICULAR
  Filled 2018-07-27: qty 10

## 2018-07-27 MED ORDER — METHYLPREDNISOLONE ACETATE 80 MG/ML IJ SUSP
80.0000 mg | Freq: Once | INTRAMUSCULAR | Status: AC
  Administered 2018-07-27: 80 mg via INTRA_ARTICULAR
  Filled 2018-07-27: qty 1

## 2018-07-27 MED ORDER — IOPAMIDOL (ISOVUE-M 200) INJECTION 41%: 10.0000 mL | Freq: Once | INTRAMUSCULAR | Status: DC

## 2018-07-27 MED ORDER — LIDOCAINE HCL 2 % IJ SOLN
20.0000 mL | Freq: Once | INTRAMUSCULAR | Status: AC
  Administered 2018-07-27: 400 mg
  Filled 2018-07-27: qty 20

## 2018-07-27 NOTE — Progress Notes (Signed)
Patient's Name: Cynthia Bennett  MRN: [id number]  Referring Provider: Jerl Mina, MD  DOB: August 11, 1933  PCP: Jerl Mina, MD  DOS: 07/27/2018  Note by: Oswaldo Done, MD  Service setting: Ambulatory outpatient  Specialty: Interventional Pain Management  Patient type: Established  Location: [facility] (AMB) Pain Management Facility  Visit type: Interventional Procedure   Primary Reason for Visit: Interventional Pain Management Treatment. CC: Hip Pain (right)  Procedure:          Anesthesia, Analgesia, Anxiolysis:  Type: Trigger Point Injection (1-2 muscle groups)          CPT: 20552 Primary Purpose: Diagnostic Region: Posterolateral Lumbosacral Level: PSIS Target Area: Trigger Point Approach: Percutaneous, ipsilateral approach. Laterality: Right-Sided Paramedial Position: Prone  Type: Local Anesthesia Indication(s): Analgesia         Local Anesthetic: Lidocaine 1-2% Route: Infiltration ([place]/IM) IV Access: Declined Sedation: Declined    Indications: 1. Trigger point with back pain (Right)   2. Chronic myofascial pain   3. Chronic low back pain (Right) w/o sciatica    Pain Score: Pre-procedure: 7 /10 Post-procedure: 0-No pain/10  Pre-op Assessment:  Ms. Odden is a 84 y.o. (year old), female patient, seen today for interventional treatment. She  has a past surgical history that includes Esophagogastroduodenoscopy (N/A, 04/16/2015); Appendectomy; and Abdominal hysterectomy. Ms. Schreckengost has a current medication list which includes the following prescription(s): acetaminophen, bupropion, dorzolamide-timolol, doxepin, meclizine, multivitamin, oxycodone, oxycodone, pantoprazole, sertraline, zolpidem, and oxycodone. Her primarily concern today is the Hip Pain (right)  Initial Vital Signs:  Pulse/HCG Rate: 79  Temp: 98.4 F (36.9 C) Resp: 16 BP: 116/79 SpO2: 98 %  BMI: Estimated body mass index is 31.09 kg/m as calculated from the following:   Height as of this encounter: 5\' 2"  (1.575  m).   Weight as of this encounter: 170 lb (77.1 kg).  Risk Assessment: Allergies: Reviewed. She is allergic to ibuprofen and nsaids.  Allergy Precautions: None required Coagulopathies: Reviewed. None identified.  Blood-thinner therapy: None at this time Active Infection(s): Reviewed. None identified. Ms. Wisenbaker is afebrile  Site Confirmation: Ms. Noto was asked to confirm the procedure and laterality before marking the site Procedure checklist: Completed Consent: Before the procedure and under the influence of no sedative(s), amnesic(s), or anxiolytics, the patient was informed of the treatment options, risks and possible complications. To fulfill our ethical and legal obligations, as recommended by the American Medical Association's Code of Ethics, I have informed the patient of my clinical impression; the nature and purpose of the treatment or procedure; the risks, benefits, and possible complications of the intervention; the alternatives, including doing nothing; the risk(s) and benefit(s) of the alternative treatment(s) or procedure(s); and the risk(s) and benefit(s) of doing nothing. The patient was provided information about the general risks and possible complications associated with the procedure. These may include, but are not limited to: failure to achieve desired goals, infection, bleeding, organ or nerve damage, allergic reactions, paralysis, and death. In addition, the patient was informed of those risks and complications associated to the procedure, such as failure to decrease pain; infection; bleeding; organ or nerve damage with subsequent damage to sensory, motor, and/or autonomic systems, resulting in permanent pain, numbness, and/or weakness of one or several areas of the body; allergic reactions; (i.e.: anaphylactic reaction); and/or death. Furthermore, the patient was informed of those risks and complications associated with the medications. These include, but are not limited to:  allergic reactions (i.e.: anaphylactic or anaphylactoid reaction(s)); adrenal axis suppression; blood sugar elevation  that in diabetics may result in ketoacidosis or comma; water retention that in patients with history of congestive heart failure may result in shortness of breath, pulmonary edema, and decompensation with resultant heart failure; weight gain; swelling or edema; medication-induced neural toxicity; particulate matter embolism and blood vessel occlusion with resultant organ, and/or nervous system infarction; and/or aseptic necrosis of one or more joints. Finally, the patient was informed that Medicine is not an exact science; therefore, there is also the possibility of unforeseen or unpredictable risks and/or possible complications that may result in a catastrophic outcome. The patient indicated having understood very clearly. We have given the patient no guarantees and we have made no promises. Enough time was given to the patient to ask questions, all of which were answered to the patient's satisfaction. Ms. Messler has indicated that she wanted to continue with the procedure. Attestation: I, the ordering provider, attest that I have discussed with the patient the benefits, risks, side-effects, alternatives, likelihood of achieving goals, and potential problems during recovery for the procedure that I have provided informed consent. Date  Time: 07/27/2018  8:52 AM  Pre-Procedure Preparation:  Monitoring: As per clinic protocol. Respiration, ETCO2, SpO2, BP, heart rate and rhythm monitor placed and checked for adequate function Safety Precautions: Patient was assessed for positional comfort and pressure points before starting the procedure. Time-out: I initiated and conducted the "Time-out" before starting the procedure, as per protocol. The patient was asked to participate by confirming the accuracy of the "Time Out" information. Verification of the correct person, site, and procedure were  performed and confirmed by me, the nursing staff, and the patient. "Time-out" conducted as per Joint Commission's Universal Protocol (UP.01.01.01). Time: 0915  Description of Procedure:          Area Prepped: Entire Posterior Lumbosacral Region Prepping solution: ChloraPrep (2% chlorhexidine gluconate and 70% isopropyl alcohol) Safety Precautions: Aspiration looking for blood return was conducted prior to all injections. At no point did we inject any substances, as a needle was being advanced. No attempts were made at seeking any paresthesias. Safe injection practices and needle disposal techniques used. Medications properly checked for expiration dates. SDV (single dose vial) medications used. Description of the Procedure: Protocol guidelines were followed. The patient was placed in position over the fluoroscopy table. The target area was identified and the area prepped in the usual manner. Skin & deeper tissues infiltrated with local anesthetic. Appropriate amount of time allowed to pass for local anesthetics to take effect. The procedure needles were then advanced to the target area. Proper needle placement secured. Negative aspiration confirmed. Solution injected in intermittent fashion, asking for systemic symptoms every 0.5cc of injectate. The needles were then removed and the area cleansed, making sure to leave some of the prepping solution back to take advantage of its long term bactericidal properties.  Vitals:   07/27/18 0851 07/27/18 0918  BP: 116/79 111/86  Pulse: 79 78  Resp: 16 18  Temp: 98.4 F (36.9 C)   TempSrc: Oral   SpO2: 98% 97%  Weight: 170 lb (77.1 kg)   Height: 5\' 2"  (1.575 m)     Start Time: 0915 hrs. End Time: 0916 hrs. Materials:  Needle(s) Type: Epidural needle Gauge: 22G Length: 3.5-in Medication(s): Please see orders for medications and dosing details.  Imaging Guidance:          Type of Imaging Technique: None used Indication(s): N/A Exposure Time: No  patient exposure Contrast: None used. Fluoroscopic Guidance: N/A Ultrasound Guidance: N/A Interpretation:  N/A  Antibiotic Prophylaxis:   Anti-infectives (From admission, onward)   None     Indication(s): None identified  Post-operative Assessment:  Post-procedure Vital Signs:  Pulse/HCG Rate: 78  Temp: 98.4 F (36.9 C) Resp: 18 BP: 111/86 SpO2: 97 %  EBL: None  Complications: No immediate post-treatment complications observed by team, or reported by patient.  Note: The patient tolerated the entire procedure well. A repeat set of vitals were taken after the procedure and the patient was kept under observation following institutional policy, for this type of procedure. Post-procedural neurological assessment was performed, showing return to baseline, prior to discharge. The patient was provided with post-procedure discharge instructions, including a section on how to identify potential problems. Should any problems arise concerning this procedure, the patient was given instructions to immediately contact us, at any time, without hesitation. In any case, we plan to contact the patient by telephone for a follow-up status report regarding this interventional procedure.  Comments:  No additional relevant information.  Plan of Care   Imaging Orders  No imaging studies ordered today    Procedure Orders     TRIGGER POINT INJECTION  Medications ordered for procedure: Meds ordered this encounter  Medications  . DISCONTD: iopamidol (ISOVUE-M) 41 % intrathecal injection 10 mL    Must be Myelogram-compatible. If not available, you may substitute with a water-soluble, non-ionic, hypoallergenic, myelogram-compatible radiological contrast medium.  Marland Kitchen. lidocaine (XYLOCAINE) 2 % (with pres) injection 400 mg  . ropivacaine (PF) 2 mg/mL (0.2%) (NAROPIN) injection 4 mL  . methylPREDNISolone acetate (DEPO-MEDROL) injection 80 mg   Medications administered: We administered lidocaine,  ropivacaine (PF) 2 mg/mL (0.2%), and methylPREDNISolone acetate.  See the medical record for exact dosing, route, and time of administration.  New Prescriptions   No medications on file   Disposition: Discharge home  Discharge Date & Time: 07/27/2018; 0920 hrs.   Physician-requested Follow-up: Return for post-procedure eval (2 wks), w/ Thad Rangerrystal King, NP.  Future Appointments  Date Time Provider Department Center  08/10/2018 11:00 AM Barbette MerinoKing, Crystal M, NP [facility] None   Primary Care Physician: Jerl MinaHedrick, James, MD [facility] Note by: Oswaldo DoneFrancisco A Aryanne Gilleland, MD Date: 07/27/2018; Time: 9:29 AM  Disclaimer:  Medicine is not an Visual merchandiserexact science. The only guarantee in medicine is that nothing is guaranteed. It is important to note that the decision to proceed with this intervention was based on the information collected from the patient. The Data and conclusions were drawn from the patient's questionnaire, the interview, and the physical examination. Because the information was provided in large part by the patient, it cannot be guaranteed that it has not been purposely or unconsciously manipulated. Every effort has been made to obtain as much relevant data as possible for this evaluation. It is important to note that the conclusions that lead to this procedure are derived in large part from the available data. Always take into account that the treatment will also be dependent on availability of resources and existing treatment guidelines, considered by other Pain Management Practitioners as being common knowledge and practice, at the time of the intervention. For Medico-Legal purposes, it is also important to point out that variation in procedural techniques and pharmacological choices are the acceptable norm. The indications, contraindications, technique, and results of the above procedure should only be interpreted and judged by a Board-Certified Interventional Pain  Specialist with extensive familiarity and expertise in the same exact procedure and technique.

## 2018-07-27 NOTE — Progress Notes (Signed)
Safety precautions to be maintained throughout the outpatient stay will include: orient to surroundings, keep bed in low position, maintain call bell within reach at all times, provide assistance with transfer out of bed and ambulation.  

## 2018-07-27 NOTE — Patient Instructions (Signed)

## 2018-07-28 ENCOUNTER — Telehealth: Payer: Self-pay | Admitting: *Deleted

## 2018-07-28 NOTE — Telephone Encounter (Signed)
Patient denies complications post procedure. 

## 2018-08-10 ENCOUNTER — Ambulatory Visit: Payer: PPO | Admitting: Nurse Practitioner

## 2018-08-13 DIAGNOSIS — E538 Deficiency of other specified B group vitamins: Secondary | ICD-10-CM | POA: Diagnosis not present

## 2018-09-24 DIAGNOSIS — E538 Deficiency of other specified B group vitamins: Secondary | ICD-10-CM | POA: Diagnosis not present

## 2018-11-12 DIAGNOSIS — E538 Deficiency of other specified B group vitamins: Secondary | ICD-10-CM | POA: Diagnosis not present

## 2018-12-03 DIAGNOSIS — E538 Deficiency of other specified B group vitamins: Secondary | ICD-10-CM | POA: Diagnosis not present

## 2018-12-31 DIAGNOSIS — E538 Deficiency of other specified B group vitamins: Secondary | ICD-10-CM | POA: Diagnosis not present

## 2019-01-09 NOTE — Progress Notes (Signed)
Patient's Name: Cynthia Bennett  MRN: [id number]  Referring Provider: [name], MD  DOB: Jul 16, 1933  PCP: [name], MD  DOS: 01/10/2019  Note by: Gaspar Cola, MD  Service setting: Ambulatory outpatient  Specialty: Interventional Pain Management  Location: [facility] (AMB) Pain Management Facility    Patient type: Established   Primary Reason(s) for Visit: Evaluation of chronic illnesses with exacerbation, or progression (Level of risk: moderate) CC: Back Pain (left, lower)  HPI  Ms. Labrador is a 85 y.o. year old, female patient, who comes today for a follow-up evaluation. She has Fibromyalgia; Adiposity; Current tobacco use; Chronic pain syndrome; Lumbar radicular pain (Bilateral) (R>L); Encounter for therapeutic drug level monitoring; Long term current use of opiate analgesic; Long term prescription opiate use; Uncomplicated opioid dependence ([place]); Opiate use; Acid reflux; Hyperlipidemia; Cannot sleep; Primary open angle glaucoma; Post menopausal syndrome; Temporary cerebral vascular dysfunction; Chronic low back pain (Primary Source of Pain) (Bilateral) (L>R); Grade 1 spondylolisthesis of L4 over L5; Lumbar discogenic pain syndrome; Lumbar facet syndrome (Bilateral) (L>R); Lumbar facet hypertrophy (Bilateral); Chronic sacroiliac joint pain (Left); Chronic hip pain (Left); Peptic ulcer disease; Morbid obesity ([place]); GERD (gastroesophageal reflux disease); Encounter for chronic pain management; Opiate analgesic contract exists ([facility]); Lumbar spondylosis; Abnormal MRI, lumbar spine (06/25/2016); Cervical spondylosis (Anterolisthesis of C5 over C6); Difficulty voiding; Neurogenic bladder; Chronic lower extremity pain (Secondary source of pain) (Left); Chronic lumbar radicular pain (Left) (L5 Dermatome); Osteoarthritis of hip (Left); Chronic hip pain (Bilateral) (R>L); Chronic sacroiliac joint pain (Bilateral) (R>L); Kidney disease, chronic, stage III (GFR 30-59  ml/min) ([place]); Chronic shoulder pain (Left); Osteoarthritis of shoulder (Left); Chronic sacroiliac joint pain (Right); Chronic hip pain (Right); Left upper arm pain; Myofascial pain syndrome (left upper extremity); Pain in left acromioclavicular joint; Osteoarthritis of left AC (acromioclavicular) joint; Supraspinatus tendon tear, left, sequela; Spondylosis without myelopathy or radiculopathy, lumbosacral region; Other specified dorsopathies, sacral and sacrococcygeal region; Bertolotti's syndrome (Left); Pharmacologic therapy; Disorder of skeletal system; Problems influencing health status; DDD (degenerative disc disease), lumbar; Severe (5-6 mm) L4-5 lumbar spinal stenosis and (8 mm) L3-4 spinal stenosis.; Osteoarthritis of hip (Right); Trigger point with back pain (Right); Chronic myofascial pain; and Chronic low back pain (Right) w/o sciatica on their problem list. Ms. Sonn was last seen on 07/27/2018. Her primarily concern today is the Back Pain (left, lower)  Pain Assessment: Location: Left, Lower Back Radiating: new onset of numbness in left third and fourth toes Onset: More than a month ago Duration: Chronic pain Quality: Burning Severity: 8 /10 (subjective, self-reported pain score)  Note: Reported level is inconsistent with clinical observations. Clinically the patient looks like a 4/10 A 4/10 is viewed as "Moderately Severe" and described as impossible to ignore for more than a few minutes. With effort, patients may still be able to manage work or participate in some social activities. Very difficult to concentrate. Signs of autonomic nervous system discharge are evident: dilated pupils (mydriasis); mild sweating (diaphoresis); sleep interference. Heart rate becomes elevated (>115 bpm). Diastolic blood pressure (lower number) rises above 100 mmHg. Patients find relief in laying down and not moving. Ms. Brickner does not seem to understand the use of our objective pain scale When using our objective  Pain Scale, levels between 6 and 10/10 are said to belong in an emergency room, as it progressively worsens from a 6/10, described as severely limiting, requiring emergency care not usually available at an outpatient pain management facility. At a 6/10 level, communication becomes  difficult and requires great effort. Assistance to reach the emergency department may be required. Facial flushing and profuse sweating along with potentially dangerous increases in heart rate and blood pressure will be evident. Timing: Intermittent Modifying factors: sitting BP: (!) 145/86  HR: 83  Today the patient returns to the clinic indicating recurrence of her low back pain.  Physical exam today would suggest the pain to be coming from the area of the left SI joint and left hip joint.  In fact, the patient had components of both of those.  She indicates her low back pain to be more significant than her leg pain, in fact she indicates not having any pain down the legs, primarily she has numbness affecting the big toe and the one next to it.  However, she indicates that this has gotten worse and now all of her toes are numb.  Further details on both, my assessment(s), as well as the proposed treatment plan, please see below.  Post-Procedure Assessment  07/27/2018 Procedure: Diagnostic/therapeutic right-sided PSIS trigger point injection #1, no fluoroscopy or IV sedation Pre-procedure pain score:  7/10 Post-procedure pain score: 0/10 (100% relief) Influential Factors: BMI: 30.11 kg/m Intra-procedural challenges: None observed.         Assessment challenges: None detected.              Reported side-effects: None.        Post-procedural adverse reactions or complications: None reported         Sedation: No sedation used. When no sedatives are used, the analgesic levels obtained are directly associated to the effectiveness of the local anesthetics. However, when sedation is provided, the level of analgesia obtained  during the initial 1 hour following the intervention, is believed to be the result of a combination of factors. These factors may include, but are not limited to: 1. The effectiveness of the local anesthetics used. 2. The effects of the analgesic(s) and/or anxiolytic(s) used. 3. The degree of discomfort experienced by the patient at the time of the procedure. 4. The patients ability and reliability in recalling and recording the events. 5. The presence and influence of possible secondary gains and/or psychosocial factors. Reported result: Relief experienced during the 1st hour after the procedure: 100 % (Ultra-Short Term Relief)            Interpretative annotation: Clinically appropriate result. No IV Analgesic or Anxiolytic given, therefore benefits are completely due to Local Anesthetic effects.          Effects of local anesthetic: The analgesic effects attained during this period are directly associated to the localized infiltration of local anesthetics and therefore cary significant diagnostic value as to the etiological location, or anatomical origin, of the pain. Expected duration of relief is directly dependent on the pharmacodynamics of the local anesthetic used. Long-acting (4-6 hours) anesthetics used.  Reported result: Relief during the next 4 to 6 hour after the procedure: 100 % (Short-Term Relief)            Interpretative annotation: Clinically appropriate result. Analgesia during this period is likely to be Local Anesthetic-related.          Long-term benefit: Defined as the period of time past the expected duration of local anesthetics (1 hour for short-acting and 4-6 hours for long-acting). With the possible exception of prolonged sympathetic blockade from the local anesthetics, benefits during this period are typically attributed to, or associated with, other factors such as analgesic sensory neuropraxia, antiinflammatory effects, or beneficial biochemical changes  provided by agents  other than the local anesthetics.  Reported result: Extended relief following procedure: 50 % (Long-Term Relief)            Interpretative annotation: Clinically possible results. Partial relief. No permanent benefit expected. Inflammation plays a part in the etiology to the pain. Benefit believed to be steroid-related.  Current benefits: Defined as reported results that persistent at this point in time.   Analgesia: >50 % Ms. Maxham reports improvement of axial symptoms. Function: Somewhat improved ROM: Somewhat improved Interpretative annotation: Ongoing benefit. Therapeutic benefit observed. Effective palliative intervention.          Interpretation: Results would suggest a successful diagnostic intervention.                  Plan:  Set up procedure as a PRN palliative treatment option for this patient.                Laboratory Chemistry  Inflammation Markers (CRP: Acute Phase) (ESR: Chronic Phase) Lab Results  Component Value Date   CRP 1 06/09/2018   ESRSEDRATE 37 06/09/2018                         Rheumatology Markers No results found.  Renal Function Markers Lab Results  Component Value Date   BUN 13 06/24/2016   CREATININE 0.95 06/24/2016   GFRAA >60 06/24/2016   GFRNONAA 55 (L) 06/24/2016                             Hepatic Function Markers Lab Results  Component Value Date   AST 17 06/24/2016   ALT 11 (L) 06/24/2016   ALBUMIN 3.9 06/24/2016   ALKPHOS 70 06/24/2016   LIPASE 139 04/27/2014                        Electrolytes Lab Results  Component Value Date   NA 137 06/24/2016   K 4.3 06/24/2016   CL 107 06/24/2016   CALCIUM 9.1 06/24/2016   MG 2.3 06/09/2018                        Neuropathy Markers Lab Results  Component Value Date   VITAMINB12 240 06/24/2016                        CNS Tests No results found.  Bone Pathology Markers Lab Results  Component Value Date   25OHVITD1 33 06/09/2018   25OHVITD2 2.0 06/09/2018   25OHVITD3 31  06/09/2018                         Coagulation Parameters Lab Results  Component Value Date   INR 0.96 06/24/2016   LABPROT 12.8 06/24/2016   PLT 291 06/24/2016                        Cardiovascular Markers Lab Results  Component Value Date   TROPONINI < 0.02 04/27/2014   HGB 12.7 06/24/2016   HCT 38.6 06/24/2016                         CA Markers No results found.  Endocrine Markers No results found.  Note: Lab results reviewed.  Imaging Review  Shoulder Imaging:. Shoulder-L MR wo  contrast:  Results for orders placed during the hospital encounter of 11/12/17  MR SHOULDER LEFT WO CONTRAST   Narrative CLINICAL DATA:  Chronic left shoulder pain  EXAM: MRI OF THE LEFT SHOULDER WITHOUT CONTRAST  TECHNIQUE: Multiplanar, multisequence MR imaging of the shoulder was performed. No intravenous contrast was administered.  COMPARISON:  03/31/2017  FINDINGS: Rotator cuff: Full-thickness partial width tear of the anterior leading edge of the supraspinatus tendon, images 13 through 15 series 7. Partial thickness articular surface insertional tearing of the adjacent central portion of the supraspinatus tendon.  Articular surface partial thickness tearing of the subscapularis. Moderate supraspinatus and subscapularis tendinopathy with mild infraspinatus tendinopathy.  Muscles:  Unremarkable  Biceps long head: Moderate tendinopathy or partial tearing of the intra-articular segment.  Acromioclavicular Joint: Moderate spurring with a edema in the joint and adjacent marrow edema. Type II acromion. As expected there is considerable fluid in the subacromial subdeltoid bursa.  Glenohumeral Joint: Small glenohumeral joint effusion. Moderate degenerative chondral thinning. Mild spurring of the humeral head.  Labrum: Slightly blunted/irregular posterosuperior labrum on image 10/4, favoring labral degeneration. Appearance is equivocal for superior labral tear.  Bones: No  significant extra-articular osseous abnormalities identified.  Other: No supplemental non-categorized findings.  IMPRESSION: 1. Full-thickness partial width tear of the anterior leading edge of the supraspinatus tendon with adjacent moderate supraspinatus tendinopathy and adjacent partial thickness articular surface insertional tearing. 2. Partial thickness articular surface tearing of the subscapularis with moderate subscapularis tendinopathy. Mild infraspinatus tendinopathy. 3. Moderate biceps tendinopathy. 4. Moderate degenerative AC joint arthropathy with mild to moderate degenerative glenohumeral arthropathy. 5. Blunted/irregular posterosuperior labrum compatible with degeneration or conceivably mild tearing.   Electronically Signed   By: Van Clines M.D.   On: 11/12/2017 08:58    Shoulder-L DG:  Results for orders placed during the hospital encounter of 03/31/17  DG Shoulder Left   Narrative CLINICAL DATA:  Left shoulder pain and limited range of motion for 4 weeks. No known injury.  EXAM: LEFT SHOULDER - 2+ VIEW  COMPARISON:  None.  FINDINGS: There is no acute bony or joint abnormality. Moderate acromioclavicular osteoarthritis is noted. The glenohumeral joint appears normal. Imaged left lung and ribs are unremarkable.  IMPRESSION: No acute finding.  Moderate acromioclavicular osteoarthritis.   Electronically Signed   By: Inge Rise M.D.   On: 03/31/2017 15:17    Lumbosacral Imaging: Lumbar MR wo contrast:  Results for orders placed during the hospital encounter of 06/25/16  MR Lumbar Spine Wo Contrast   Narrative CLINICAL DATA:  Chronic low back pain, bilateral radicular pain and severe spinal stenosis  EXAM: MRI LUMBAR SPINE WITHOUT CONTRAST  TECHNIQUE: Multiplanar, multisequence MR imaging of the lumbar spine was performed. No intravenous contrast was administered.  COMPARISON:  07/04/2010  FINDINGS: Segmentation:   Standard.  Alignment: Levoscoliosis of the lumbar spine. 3 mm of anterolisthesis of L4 on L5.  Vertebrae:  No fracture, evidence of discitis, or bone lesion.  Conus medullaris: Extends to the T12-L1 level and appears normal.  Paraspinal and other soft tissues: No focal paraspinal abnormality.  Disc levels:  Disc spaces: Degenerative disc disease with disc height loss at L2-3, L3-4 and L4-5.  T12-L1: Mild broad-based disc bulge. No evidence of neural foraminal stenosis. No central canal stenosis.  L1-L2: Mild broad-based disc bulge. Mild bilateral facet arthropathy. Mild right lateral recess stenosis. No evidence of neural foraminal stenosis. No central canal stenosis.  L2-L3: Mild broad-based disc bulge. Moderate right and mild left facet arthropathy. No evidence  of neural foraminal stenosis. Mild spinal stenosis.  L3-L4: Mild broad-based disc bulge. Moderate bilateral facet arthropathy. Mild spinal stenosis and right lateral recess stenosis. No evidence of neural foraminal stenosis.  L4-L5: Moderate broad-based disc bulge. Severe bilateral facet arthropathy with ligamentum flavum infolding resulting in severe left lateral recess stenosis and moderate-severe spinal stenosis. No evidence of neural foraminal stenosis.  L5-S1: No significant disc bulge. Mild bilateral facet arthropathy. No evidence of neural foraminal stenosis. No central canal stenosis.  IMPRESSION: 1. At L4-5 there is a moderate broad-based disc bulge. Severe bilateral facet arthropathy with ligamentum flavum infolding resulting in severe left lateral recess stenosis and moderate-severe spinal stenosis. 2. At L3-4 there is a mild broad-based disc bulge. Moderate bilateral facet arthropathy. Mild spinal stenosis and right lateral recess stenosis. 3. At L2-3 there is a mild broad-based disc bulge. Mild bilateral facet arthropathy. Mild right lateral recess stenosis. 4. Overall, there is no significant  change compared with the prior exam of 02/01/2010. [place]   Electronically Signed   By: Kathreen Devoid   On: 06/25/2016 13:22    Hip Imaging: Hip-R DG 2-3 views:  Results for orders placed during the hospital encounter of 07/19/18  DG HIP UNILAT W OR W/O PELVIS 2-3 VIEWS RIGHT   Narrative CLINICAL DATA:  Right hip pain for 1 year.  No known injury.  EXAM: DG HIP (WITH OR WITHOUT PELVIS) 2-3V RIGHT  COMPARISON:  None.  FINDINGS: There is no evidence of hip fracture or dislocation. Mild bilateral hip degenerative disease is seen. There is partial visualization of convex left lower lumbar scoliosis and spondylosis. Transitional lumbosacral anatomy is noted.  IMPRESSION: Minimal degenerative disease about the hips.  No acute abnormality.  Lower lumbar spondylosis.   Electronically Signed   By: Inge Rise M.D.   On: 07/20/2018 08:06    Complexity Note: Imaging results reviewed. Results shared with Ms. Brusseau, using Layman's terms.                         Meds   Current Outpatient Medications:  .  acetaminophen (TYLENOL) 325 MG tablet, Take 650 mg by mouth every 6 (six) hours as needed., Disp: , Rfl:  .  buPROPion (WELLBUTRIN SR) 150 MG 12 hr tablet, TAKE 1 TABLET BY MOUTH ONCE DAILY, Disp: , Rfl:  .  dorzolamide-timolol (COSOPT) 22.3-6.8 MG/ML ophthalmic solution, INT 1 GTT IN OU BID, Disp: , Rfl: 5 .  doxepin (SINEQUAN) 100 MG capsule, Take 100 mg by mouth at bedtime., Disp: , Rfl:  .  meclizine (ANTIVERT) 12.5 MG tablet, Take 1 tablet by mouth daily as needed., Disp: , Rfl: 1 .  Multiple Vitamin (MULTIVITAMIN) tablet, Take 1 tablet by mouth daily., Disp: , Rfl:  .  pantoprazole (PROTONIX) 40 MG tablet, Take 1 tablet by mouth 2 (two) times daily., Disp: , Rfl: 11 .  sertraline (ZOLOFT) 100 MG tablet, Take 1 tablet by mouth every morning., Disp: , Rfl:  .  zolpidem (AMBIEN) 10 MG tablet, TAKE 1/2 TO 1 TABLET BY MOUTH EVERY NIGHT AT BEDTIME, Disp: , Rfl:  .  [START ON  03/11/2019] oxyCODONE (OXY IR/ROXICODONE) 5 MG immediate release tablet, Take 1 tablet (5 mg total) by mouth every 12 (twelve) hours as needed for up to 30 days for severe pain., Disp: 60 tablet, Rfl: 0 .  [START ON 02/09/2019] oxyCODONE (OXY IR/ROXICODONE) 5 MG immediate release tablet, Take 1 tablet (5 mg total) by mouth every 12 (twelve) hours  as needed for up to 30 days for severe pain., Disp: 60 tablet, Rfl: 0 .  oxyCODONE (OXY IR/ROXICODONE) 5 MG immediate release tablet, Take 1 tablet (5 mg total) by mouth every 12 (twelve) hours as needed for up to 30 days for severe pain., Disp: 60 tablet, Rfl: 0  ROS  Constitutional: Denies any fever or chills Gastrointestinal: No reported hemesis, hematochezia, vomiting, or acute GI distress Musculoskeletal: Denies any acute onset joint swelling, redness, loss of ROM, or weakness Neurological: No reported episodes of acute onset apraxia, aphasia, dysarthria, agnosia, amnesia, paralysis, loss of coordination, or loss of consciousness  Allergies  Ms. Lomba is allergic to ibuprofen and nsaids.  PFSH  Drug: Ms. Heinrichs  reports no history of drug use. Alcohol:  reports no history of alcohol use. Tobacco:  reports that she has quit smoking. She has never used smokeless tobacco. Medical:  has a past medical history of Dizzy spells, Fibromyalgia, Glaucoma, Hiatal hernia, Osteoarthritis, Osteoarthritis of spine with radiculopathy, lumbar region (08/14/2015), Peptic ulcer disease, Reflux, Spinal stenosis, Stroke ([place]), and TIA (transient ischemic attack). Surgical: Ms. Canner  has a past surgical history that includes Esophagogastroduodenoscopy (N/A, 04/16/2015); Appendectomy; and Abdominal hysterectomy. Family: family history includes COPD in her father; Mental illness in her mother.  Constitutional Exam  General appearance: Well nourished, well developed, and well hydrated. In no apparent acute distress Vitals:   01/10/19 1035  BP: (!) 145/86  Pulse: 83  Resp: 18   Temp: 97.9 F (36.6 C)  TempSrc: Oral  SpO2: 99%  Weight: 170 lb (77.1 kg)  Height: 5' 3" (1.6 m)   BMI Assessment: Estimated body mass index is 30.11 kg/m as calculated from the following:   Height as of this encounter: 5' 3" (1.6 m).   Weight as of this encounter: 170 lb (77.1 kg).  BMI interpretation table: BMI level Category Range association with higher incidence of chronic pain  <18 kg/m2 Underweight   18.5-24.9 kg/m2 Ideal body weight   25-29.9 kg/m2 Overweight Increased incidence by 20%  30-34.9 kg/m2 Obese (Class I) Increased incidence by 68%  35-39.9 kg/m2 Severe obesity (Class II) Increased incidence by 136%  >40 kg/m2 Extreme obesity (Class III) Increased incidence by 254%   Patient's current BMI Ideal Body weight  Body mass index is 30.11 kg/m. Ideal body weight: 52.4 kg (115 lb 8.3 oz) Adjusted ideal body weight: 62.3 kg (137 lb 5 oz)   BMI Readings from Last 4 Encounters:  01/10/19 30.11 kg/m  07/27/18 31.09 kg/m  07/19/18 31.09 kg/m  06/29/18 31.09 kg/m   Wt Readings from Last 4 Encounters:  01/10/19 170 lb (77.1 kg)  07/27/18 170 lb (77.1 kg)  07/19/18 170 lb (77.1 kg)  06/29/18 170 lb (77.1 kg)  Psych/Mental status: Alert, oriented x 3 (person, place, & time)       Eyes: PERLA Respiratory: No evidence of acute respiratory distress  Cervical Spine Area Exam  Skin & Axial Inspection: No masses, redness, edema, swelling, or associated skin lesions Alignment: Symmetrical Functional ROM: Unrestricted ROM      Stability: No instability detected Muscle Tone/Strength: Functionally intact. No obvious neuro-muscular anomalies detected. Sensory (Neurological): Unimpaired Palpation: No palpable anomalies              Upper Extremity (UE) Exam    Side: Right upper extremity  Side: Left upper extremity  Skin & Extremity Inspection: Skin color, temperature, and hair growth are WNL. No peripheral edema or cyanosis. No masses, redness, swelling, asymmetry,  or  associated skin lesions. No contractures.  Skin & Extremity Inspection: Skin color, temperature, and hair growth are WNL. No peripheral edema or cyanosis. No masses, redness, swelling, asymmetry, or associated skin lesions. No contractures.  Functional ROM: Unrestricted ROM          Functional ROM: Unrestricted ROM          Muscle Tone/Strength: Functionally intact. No obvious neuro-muscular anomalies detected.  Muscle Tone/Strength: Functionally intact. No obvious neuro-muscular anomalies detected.  Sensory (Neurological): Unimpaired          Sensory (Neurological): Unimpaired          Palpation: No palpable anomalies              Palpation: No palpable anomalies              Provocative Test(s):  Phalen's test: deferred Tinel's test: deferred Apley's scratch test (touch opposite shoulder):  Action 1 (Across chest): deferred Action 2 (Overhead): deferred Action 3 (LB reach): deferred   Provocative Test(s):  Phalen's test: deferred Tinel's test: deferred Apley's scratch test (touch opposite shoulder):  Action 1 (Across chest): deferred Action 2 (Overhead): deferred Action 3 (LB reach): deferred    Thoracic Spine Area Exam  Skin & Axial Inspection: No masses, redness, or swelling Alignment: Symmetrical Functional ROM: Unrestricted ROM Stability: No instability detected Muscle Tone/Strength: Functionally intact. No obvious neuro-muscular anomalies detected. Sensory (Neurological): Unimpaired Muscle strength & Tone: No palpable anomalies  Lumbar Spine Area Exam  Skin & Axial Inspection: No masses, redness, or swelling Alignment: Symmetrical Functional ROM: Adequate ROM       Stability: No instability detected Muscle Tone/Strength: Functionally intact. No obvious neuro-muscular anomalies detected. Sensory (Neurological): Unimpaired Palpation: No palpable anomalies       Provocative Tests: Hyperextension/rotation test: (-) bilaterally for facet joint pain. Lumbar quadrant test  (Kemp's test): (-) bilaterally for facet joint pain. Lateral bending test: deferred today       Patrick's Maneuver: (+) for left-sided S-I arthralgia and for left hip arthralgia FABER* test: (+) for left-sided S-I arthralgia and for left hip arthralgia S-I anterior distraction/compression test: deferred today         S-I lateral compression test: deferred today         S-I Thigh-thrust test: deferred today         S-I Gaenslen's test: deferred today         *(Flexion, ABduction and External Rotation)  Gait & Posture Assessment  Ambulation: Limited Gait: Relatively normal for age and body habitus Posture: Antalgic   Lower Extremity Exam    Side: Right lower extremity  Side: Left lower extremity  Stability: No instability observed          Stability: No instability observed          Skin & Extremity Inspection: Skin color, temperature, and hair growth are WNL. No peripheral edema or cyanosis. No masses, redness, swelling, asymmetry, or associated skin lesions. No contractures.  Skin & Extremity Inspection: Skin color, temperature, and hair growth are WNL. No peripheral edema or cyanosis. No masses, redness, swelling, asymmetry, or associated skin lesions. No contractures.  Functional ROM: Unrestricted ROM                  Functional ROM: Unrestricted ROM                  Muscle Tone/Strength: Functionally intact. No obvious neuro-muscular anomalies detected.  Muscle Tone/Strength: Functionally intact. No obvious neuro-muscular anomalies detected.  Sensory (Neurological):  Unimpaired        Sensory (Neurological): Dermatomal numbness pattern top of foot (L5)  DTR: Patellar: deferred today Achilles: deferred today Plantar: deferred today  DTR: Patellar: deferred today Achilles: deferred today Plantar: deferred today  Palpation: No palpable anomalies  Palpation: No palpable anomalies   Assessment   Status Diagnosis  Worsened Recurring Recurring 1. Chronic low back pain (Primary Source  of Pain) (Bilateral) (L>R)   2. Chronic sacroiliac joint pain (Left)   3. Chronic hip pain (Left)   4. Chronic lower extremity pain (Secondary source of pain) (Left)   5. DDD (degenerative disc disease), lumbar   6. Severe (5-6 mm) L4-5 lumbar spinal stenosis and (8 mm) L3-4 spinal stenosis.   7. Chronic pain syndrome   8. Pharmacologic therapy   9. Disorder of skeletal system   10. Problems influencing health status      Updated Problems: No problems updated.  Plan of Care  Pharmacotherapy (Medications Ordered): Meds ordered this encounter  Medications  . oxyCODONE (OXY IR/ROXICODONE) 5 MG immediate release tablet    Sig: Take 1 tablet (5 mg total) by mouth every 12 (twelve) hours as needed for up to 30 days for severe pain.    Dispense:  60 tablet    Refill:  0    [place] STOP ACT - Not applicable to Chronic Pain Syndrome (G89.4) diagnosis. Fill one day early if pharmacy is closed on scheduled refill date. Do not fill until: 03/11/19. To last until: 04/10/19.  Marland Kitchen oxyCODONE (OXY IR/ROXICODONE) 5 MG immediate release tablet    Sig: Take 1 tablet (5 mg total) by mouth every 12 (twelve) hours as needed for up to 30 days for severe pain.    Dispense:  60 tablet    Refill:  0    [place] STOP ACT - Not applicable to Chronic Pain Syndrome (G89.4) diagnosis. Fill one day early if pharmacy is closed on scheduled refill date. Do not fill until: 02/09/19. To last until: 03/11/19.  Marland Kitchen oxyCODONE (OXY IR/ROXICODONE) 5 MG immediate release tablet    Sig: Take 1 tablet (5 mg total) by mouth every 12 (twelve) hours as needed for up to 30 days for severe pain.    Dispense:  60 tablet    Refill:  0    [place] STOP ACT - Not applicable to Chronic Pain Syndrome (G89.4) diagnosis. Fill one day early if pharmacy is closed on scheduled refill date. Do not fill until: 01/10/19. To last until: 02/09/19.   Medications administered today: Letta Median B. Andreasen had no medications administered during this visit.  Orders:  Orders  Placed This Encounter  Procedures  . SACROILIAC JOINT INJECTION    Standing Status:   Future    Standing Expiration Date:   02/10/2019    Scheduling Instructions:     Side: Left-sided     Sedation: With Sedation.     Timeframe: ASAP    Order Specific Question:   Where will this procedure be performed?    Answer:   [facility]  . HIP INJECTION    Standing Status:   Future    Standing Expiration Date:   02/09/2019    Scheduling Instructions:     Side: Left-sided     Sedation: With Sedation.     Timeframe: As soon as schedule allows  . Comp. Metabolic Panel (12)    With GFR. Indications: Chronic Pain Syndrome (G89.4) & Pharmacotherapy (R41.638)    Order Specific Question:   Has the patient  fasted?    Answer:   No    Order Specific Question:   CC Results    Answer:   PCP-NURSE [[id number]]  . Magnesium    Indication: Pharmacologic therapy (S06.301)    Order Specific Question:   CC Results    Answer:   PCP-NURSE [[id number]]  . Vitamin B12    Indication: Pharmacologic therapy (A35.573).    Order Specific Question:   CC Results    Answer:   PCP-NURSE [[id number]]  . Sedimentation rate    Indication: Disorder of skeletal system (M89.9)    Order Specific Question:   CC Results    Answer:   PCP-NURSE [[id number]]  . 25-Hydroxyvitamin D Lcms D2+D3    Indication: Disorder of skeletal system (M89.9).    Order Specific Question:   CC Results    Answer:   PCP-NURSE [[id number]]  . C-reactive protein    Indication: Problems influencing health status (Z78.9)    Order Specific Question:   CC Results    Answer:   PCP-NURSE [[id number]]  . ToxASSURE Select 13 (MW), Urine    Volume: 30 ml(s). Minimum 3 ml of urine is needed. Document temperature of fresh sample. Indications: Long term (current) use of opiate analgesic (Z79.891)  . Nursing Instructions:    Inform patient that follow-up appointment will be rescheduled if studies are not available.    Scheduling Instructions:     Please arrange for the  patient to have blood work done today.      Remind patient that Lab orders are good for only 10 days. After 10 days, the orders will be automatically deleted and we will not be resubmitting them.  . Lab instructions    Inform patient that follow-up appointment will be rescheduled if studies are not available.    Scheduling Instructions:     Please arrange for the patient to have blood work done today.     Remind patient that Lab orders are good for only 10 days. After 10 days, the orders will be automatically deleted and we will not be resubmitting them.  . Nursing Instructions:    Scheduling Instructions:     1. Medication Agreement: Please go over agreement with the patient. Have the patient read and sign the agreement. Provide patient with a copy of the signed agreement.     2. Make sure that the patient has completed the ORT (Opioid Risk Tool).     3. Provide the patient with a copy of our "Medicatiion Policy", "Medication Recommendations and Reminders", and "CBD information".     4. Remind the patient to always bring their medications and medication bottles (even if empty) to all appointments except for procedure appointments.  . Nursing communication    Scheduling Instructions:     Complete the opioid risk tool (ORT) questionnaire.  . Schedule appointment    Scheduling Instructions:     Schedule the patient to have all medication management appointments and medication refills with Janice Norrie, NP    Lab Orders     Comp. Metabolic Panel (12)     Magnesium     Vitamin B12     Sedimentation rate     25-Hydroxyvitamin D Lcms D2+D3     C-reactive protein     ToxASSURE Select 13 (MW), Urine Imaging Orders  No imaging studies ordered today   Referral Orders  No referral(s) requested today   Interventional management options: Planned follow-up:   Diagnostic/therapeuutic left hip + left SI joint block under fluoroscopic guidance,  no sedation. Plan: Return for Procedure (no  sedation): (L) HIP + (L) SI BLK.   Considering:   Diagnostic left lumbar facet block #2 Possible bilateral lumbar facet RFA Diagnostic right sacroiliac joint block #2  Possible bilateral sacroiliac joint RFA Diagnostic left L4 TFESI  Diagnostic right L4-5 LESI #2  Diagnostic right L5-S1 LESI #2  Diagnostic left L5-S1 LESI #3  Diagnostic bilateral intra-articular hip joint injection Diagnostic bilateral femoral nerve + obturator nerve block Possible bilateral femoral nerve + obturator nerve RFA Diagnosticleft AC joint injection + MNB (L) Biceps muscle #2  Diagnostic left intra-articular shoulder joint injection #2    Palliative PRN treatment(s):   Palliative left L4-5LESI #4  PalliativeL5-S1interlaminarLESI Palliative left intra-articular shoulder joint injection Palliative right sacroiliac joint block Palliative trigger point injections     Future Appointments  Date Time Provider [place]  01/20/2019 10:15 AM Milinda Pointer, MD [facility] None  04/05/2019 10:45 AM Vevelyn Francois, NP [facility] None   Primary Care Physician: [name], MD Location: [facility] Note by: Gaspar Cola, MD Date: 01/10/2019; Time: 12:44 PM

## 2019-01-10 ENCOUNTER — Encounter: Payer: Self-pay | Admitting: Pain Medicine

## 2019-01-10 ENCOUNTER — Ambulatory Visit: Payer: PPO | Attending: Pain Medicine | Admitting: Pain Medicine

## 2019-01-10 ENCOUNTER — Other Ambulatory Visit: Payer: Self-pay

## 2019-01-10 VITALS — BP 145/86 | HR 83 | Temp 97.9°F | Resp 18 | Ht 63.0 in | Wt 170.0 lb

## 2019-01-10 DIAGNOSIS — M25552 Pain in left hip: Secondary | ICD-10-CM

## 2019-01-10 DIAGNOSIS — Z789 Other specified health status: Secondary | ICD-10-CM

## 2019-01-10 DIAGNOSIS — G8929 Other chronic pain: Secondary | ICD-10-CM

## 2019-01-10 DIAGNOSIS — G894 Chronic pain syndrome: Secondary | ICD-10-CM

## 2019-01-10 DIAGNOSIS — M5136 Other intervertebral disc degeneration, lumbar region: Secondary | ICD-10-CM | POA: Diagnosis not present

## 2019-01-10 DIAGNOSIS — M533 Sacrococcygeal disorders, not elsewhere classified: Secondary | ICD-10-CM

## 2019-01-10 DIAGNOSIS — M79605 Pain in left leg: Secondary | ICD-10-CM

## 2019-01-10 DIAGNOSIS — Z79899 Other long term (current) drug therapy: Secondary | ICD-10-CM | POA: Diagnosis not present

## 2019-01-10 DIAGNOSIS — M899 Disorder of bone, unspecified: Secondary | ICD-10-CM | POA: Diagnosis not present

## 2019-01-10 DIAGNOSIS — M545 Low back pain: Secondary | ICD-10-CM | POA: Diagnosis not present

## 2019-01-10 DIAGNOSIS — M48062 Spinal stenosis, lumbar region with neurogenic claudication: Secondary | ICD-10-CM | POA: Diagnosis not present

## 2019-01-10 MED ORDER — OXYCODONE HCL 5 MG PO TABS: 5.0000 mg | ORAL_TABLET | Freq: Two times a day (BID) | ORAL | 0 refills | Status: DC | PRN

## 2019-01-10 NOTE — Patient Instructions (Addendum)
____________________________________________________________________________________________  Preparing for Procedure with Sedation  Instructions: . Oral Intake: Do not eat or drink anything for at least 8 hours prior to your procedure. . Transportation: Public transportation is not allowed. Bring an adult driver. The driver must be physically present in our waiting room before any procedure can be started. . Physical Assistance: Bring an adult physically capable of assisting you, in the event you need help. This adult should keep you company at home for at least 6 hours after the procedure. . Blood Pressure Medicine: Take your blood pressure medicine with a sip of water the morning of the procedure. . Blood thinners: Notify our staff if you are taking any blood thinners. Depending on which one you take, there will be specific instructions on how and when to stop it. . Diabetics on insulin: Notify the staff so that you can be scheduled 1st case in the morning. If your diabetes requires high dose insulin, take only  of your normal insulin dose the morning of the procedure and notify the staff that you have done so. . Preventing infections: Shower with an antibacterial soap the morning of your procedure. . Build-up your immune system: Take 1000 mg of Vitamin C with every meal (3 times a day) the day prior to your procedure. . Antibiotics: Inform the staff if you have a condition or reason that requires you to take antibiotics before dental procedures. . Pregnancy: If you are pregnant, call and cancel the procedure. . Sickness: If you have a cold, fever, or any active infections, call and cancel the procedure. . Arrival: You must be in the facility at least 30 minutes prior to your scheduled procedure. . Children: Do not bring children with you. . Dress appropriately: Bring dark clothing that you would not mind if they get stained. . Valuables: Do not bring any jewelry or valuables.  Procedure  appointments are reserved for interventional treatments only. . No Prescription Refills. . No medication changes will be discussed during procedure appointments. . No disability issues will be discussed.  Reasons to call and reschedule or cancel your procedure: (Following these recommendations will minimize the risk of a serious complication.) . Surgeries: Avoid having procedures within 2 weeks of any surgery. (Avoid for 2 weeks before or after any surgery). . Flu Shots: Avoid having procedures within 2 weeks of a flu shots or . (Avoid for 2 weeks before or after immunizations). . Barium: Avoid having a procedure within 7-10 days after having had a radiological study involving the use of radiological contrast. (Myelograms, Barium swallow or enema study). . Heart attacks: Avoid any elective procedures or surgeries for the initial 6 months after a "Myocardial Infarction" (Heart Attack). . Blood thinners: It is imperative that you stop these medications before procedures. Let us know if you if you take any blood thinner.  . Infection: Avoid procedures during or within two weeks of an infection (including chest colds or gastrointestinal problems). Symptoms associated with infections include: Localized redness, fever, chills, night sweats or profuse sweating, burning sensation when voiding, cough, congestion, stuffiness, runny nose, sore throat, diarrhea, nausea, vomiting, cold or Flu symptoms, recent or current infections. It is specially important if the infection is over the area that we intend to treat. . Heart and lung problems: Symptoms that may suggest an active cardiopulmonary problem include: cough, chest pain, breathing difficulties or shortness of breath, dizziness, ankle swelling, uncontrolled high or unusually low blood pressure, and/or palpitations. If you are experiencing any of these symptoms, cancel   your procedure and contact your primary care physician for an evaluation.  Remember:   Regular Business hours are:  Monday to Thursday 8:00 AM to 4:00 PM  Provider's Schedule: Delano Metz, MD:  Procedure days: Tuesday and Thursday 7:30 AM to 4:00 PM  Edward Jolly, MD:  Procedure days: Monday and Wednesday 7:30 AM to 4:00 PM ____________________________________________________________________________________________  You have been instructed to  get labwork done today.

## 2019-01-10 NOTE — Progress Notes (Signed)
Nursing Pain Medication Assessment:  Safety precautions to be maintained throughout the outpatient stay will include: orient to surroundings, keep bed in low position, maintain call bell within reach at all times, provide assistance with transfer out of bed and ambulation.  Medication Inspection Compliance: Pill count conducted under aseptic conditions, in front of the patient. Neither the pills nor the bottle was removed from the patient's sight at any time. Once count was completed pills were immediately returned to the patient in their original bottle.  Medication: Oxycodone IR Pill/Patch Count: 6 of 60 pills remain Pill/Patch Appearance: Markings consistent with prescribed medication Bottle Appearance: Standard pharmacy container. Clearly labeled. Filled Date: 12/09 / 2019 Last Medication intake:  Yesterday

## 2019-01-13 LAB — COMP. METABOLIC PANEL (12)
ALBUMIN: 4.3 g/dL (ref 3.6–4.6)
AST: 18 IU/L (ref 0–40)
Albumin/Globulin Ratio: 1.5 (ref 1.2–2.2)
Alkaline Phosphatase: 74 IU/L (ref 39–117)
BILIRUBIN TOTAL: 0.3 mg/dL (ref 0.0–1.2)
BUN/Creatinine Ratio: 11 — ABNORMAL LOW (ref 12–28)
BUN: 14 mg/dL (ref 8–27)
Calcium: 9.5 mg/dL (ref 8.7–10.3)
Chloride: 104 mmol/L (ref 96–106)
Creatinine, Ser: 1.26 mg/dL — ABNORMAL HIGH (ref 0.57–1.00)
GFR calc Af Amer: 46 mL/min/{1.73_m2} — ABNORMAL LOW (ref >59)
GFR, EST NON AFRICAN AMERICAN: 39 mL/min/{1.73_m2} — AB (ref >59)
GLOBULIN, TOTAL: 2.8 g/dL (ref 1.5–4.5)
GLUCOSE: 84 mg/dL (ref 65–99)
POTASSIUM: 4.7 mmol/L (ref 3.5–5.2)
SODIUM: 141 mmol/L (ref 134–144)
Total Protein: 7.1 g/dL (ref 6.0–8.5)

## 2019-01-13 LAB — MAGNESIUM: Magnesium: 2.3 mg/dL (ref 1.6–2.3)

## 2019-01-13 LAB — 25-HYDROXY VITAMIN D LCMS D2+D3
25-Hydroxy, Vitamin D-2: 1.6 ng/mL
25-Hydroxy, Vitamin D-3: 24 ng/mL
25-Hydroxy, Vitamin D: 26 ng/mL — ABNORMAL LOW

## 2019-01-13 LAB — VITAMIN B12: VITAMIN B 12: 1471 pg/mL — AB (ref 232–1245)

## 2019-01-13 LAB — SEDIMENTATION RATE: Sed Rate: 51 mm/hr — ABNORMAL HIGH (ref 0–40)

## 2019-01-13 LAB — TOXASSURE SELECT 13 (MW), URINE

## 2019-01-13 LAB — C-REACTIVE PROTEIN: CRP: 2 mg/L (ref 0–10)

## 2019-01-20 ENCOUNTER — Ambulatory Visit: Payer: PPO | Admitting: Pain Medicine

## 2019-01-20 NOTE — Progress Notes (Deleted)
Patient's Name: Cynthia Bennett  MRN: [id number]  Referring Provider: Jerl Mina, MD  DOB: [birth year]  PCP: Jerl Mina, MD  DOS: 01/20/2019  Note by: Oswaldo Done, MD  Service setting: Ambulatory outpatient  Specialty: Interventional Pain Management  Patient type: Established  Location: [facility] (AMB) Pain Management Facility  Visit type: Interventional Procedure   Primary Reason for Visit: Interventional Pain Management Treatment. CC: No chief complaint on file.  Procedure #1:  Anesthesia, Analgesia, Anxiolysis:  Type: Intra-Articular Hip Injection #1  Primary Purpose: Diagnostic Region: Posterolateral hip joint area. Level: Lower pelvic and hip joint level. Target Area: Superior aspect of the hip joint cavity, going thru the superior portion of the capsular ligament. Approach: Posterolateral approach. Laterality: Left-Sided  Type: Local Anesthesia Indication(s): Analgesia         Route: Infiltration ([place]/IM) IV Access: Declined Sedation: Declined  Local Anesthetic: Lidocaine 1-2%  Position: Right Lateral Decubitus. Prepped Area: Entire Posterolateral hip area. Prepping solution: ChloraPrep (2% chlorhexidine gluconate and 70% isopropyl alcohol)   Indications: 1. Primary osteoarthritis of left hip   2. Chronic hip pain (Left)    Procedure #2:  Anesthesia, Analgesia, Anxiolysis:  Type: Diagnostic Sacroiliac Joint Steroid Injection          Region: Superior Lumbosacral Region Level: PSIS (Posterior Superior Iliac Spine) Laterality: Bilateral  Type: Moderate (Conscious) Sedation combined with Local Anesthesia Indication(s): Analgesia and Anxiety Route: Intravenous (IV) IV Access: Secured Sedation: Meaningful verbal contact was maintained at all times during the procedure  Local Anesthetic: Lidocaine 1-2%  Position: Prone           Indications: 1. Other specified dorsopathies, sacral and sacrococcygeal region   2. Chronic sacroiliac joint pain (Left)    Pain  Score: Pre-procedure:  /10 Post-procedure:  /10  Pre-op Assessment:  Cynthia Bennett is a 85 y.o. (year old), female patient, seen today for interventional treatment. She  has a past surgical history that includes Esophagogastroduodenoscopy (N/A, 04/16/2015); Appendectomy; and Abdominal hysterectomy. Cynthia Bennett has a current medication list which includes the following prescription(s): acetaminophen, bupropion, dorzolamide-timolol, doxepin, meclizine, multivitamin, oxycodone, oxycodone, oxycodone, pantoprazole, sertraline, and zolpidem. Her primarily concern today is the No chief complaint on file.  Initial Vital Signs:  Pulse/HCG Rate:    Temp:   Resp:   BP:   SpO2:    BMI: Estimated body mass index is 30.11 kg/m as calculated from the following:   Height as of 01/10/19:  (1.6 m).   Weight as of 01/10/19: 170 lb (77.1 kg).  Risk Assessment: Allergies: Reviewed. She is allergic to ibuprofen and nsaids.  Allergy Precautions: None required Coagulopathies: Reviewed. None identified.  Blood-thinner therapy: None at this time Active Infection(s): Reviewed. None identified. Cynthia Bennett is afebrile  Site Confirmation: Cynthia Bennett was asked to confirm the procedure and laterality before marking the site Procedure checklist: Completed Consent: Before the procedure and under the influence of no sedative(s), amnesic(s), or anxiolytics, the patient was informed of the treatment options, risks and possible complications. To fulfill our ethical and legal obligations, as recommended by the American Medical Association's Code of Ethics, I have informed the patient of my clinical impression; the nature and purpose of the treatment or procedure; the risks, benefits, and possible complications of the intervention; the alternatives, including doing nothing; the risk(s) and benefit(s) of the alternative treatment(s) or procedure(s); and the risk(s) and benefit(s) of doing nothing. The patient was provided information about  the general risks and possible complications associated with the procedure. These  may include, but are not limited to: failure to achieve desired goals, infection, bleeding, organ or nerve damage, allergic reactions, paralysis, and death. In addition, the patient was informed of those risks and complications associated to the procedure, such as failure to decrease pain; infection; bleeding; organ or nerve damage with subsequent damage to sensory, motor, and/or autonomic systems, resulting in permanent pain, numbness, and/or weakness of one or several areas of the body; allergic reactions; (i.e.: anaphylactic reaction); and/or death. Furthermore, the patient was informed of those risks and complications associated with the medications. These include, but are not limited to: allergic reactions (i.e.: anaphylactic or anaphylactoid reaction(s)); adrenal axis suppression; blood sugar elevation that in diabetics may result in ketoacidosis or comma; water retention that in patients with history of congestive heart failure may result in shortness of breath, pulmonary edema, and decompensation with resultant heart failure; weight gain; swelling or edema; medication-induced neural toxicity; particulate matter embolism and blood vessel occlusion with resultant organ, and/or nervous system infarction; and/or aseptic necrosis of one or more joints. Finally, the patient was informed that Medicine is not an exact science; therefore, there is also the possibility of unforeseen or unpredictable risks and/or possible complications that may result in a catastrophic outcome. The patient indicated having understood very clearly. We have given the patient no guarantees and we have made no promises. Enough time was given to the patient to ask questions, all of which were answered to the patient's satisfaction. Cynthia Bennett has indicated that she wanted to continue with the procedure. Attestation: I, the ordering provider, attest that I  have discussed with the patient the benefits, risks, side-effects, alternatives, likelihood of achieving goals, and potential problems during recovery for the procedure that I have provided informed consent. Date  Time: {CHL [facility]-PAIN TIME CHOICES:[id number]}  Pre-Procedure Preparation:  Monitoring: As per clinic protocol. Respiration, ETCO2, SpO2, BP, heart rate and rhythm monitor placed and checked for adequate function Safety Precautions: Patient was assessed for positional comfort and pressure points before starting the procedure. Time-out: I initiated and conducted the "Time-out" before starting the procedure, as per protocol. The patient was asked to participate by confirming the accuracy of the "Time Out" information. Verification of the correct person, site, and procedure were performed and confirmed by me, the nursing staff, and the patient. "Time-out" conducted as per Joint Commission's Universal Protocol (UP.01.01.01). Time:    Description of Procedure #1:  Safety Precautions: Aspiration looking for blood return was conducted prior to all injections. At no point did we inject any substances, as a needle was being advanced. No attempts were made at seeking any paresthesias. Safe injection practices and needle disposal techniques used. Medications properly checked for expiration dates. SDV (single dose vial) medications used. Description of the Procedure: Protocol guidelines were followed. The patient was placed in position over the fluoroscopy table. The target area was identified and the area prepped in the usual manner. Skin & deeper tissues infiltrated with local anesthetic. Appropriate amount of time allowed to pass for local anesthetics to take effect. The procedure needles were then advanced to the target area. Proper needle placement secured. Negative aspiration confirmed. Solution injected in intermittent fashion, asking for systemic symptoms every 0.5cc of injectate. The needles were  then removed and the area cleansed, making sure to leave some of the prepping solution back to take advantage of its long term bactericidal properties. There were no vitals filed for this visit.  Start Time:   hrs. End Time:   hrs. Materials:  Needle(s) Type: Spinal Needle Gauge: 22G Length: 5.0-in Medication(s): Please see orders for medications and dosing details.  Imaging Guidance (Non-Spinal) for procedure #1:  Type of Imaging Technique: Fluoroscopy Guidance (Non-Spinal) Indication(s): Assistance in needle guidance and placement for procedures requiring needle placement in or near specific anatomical locations not easily accessible without such assistance. Exposure Time: Please see nurses notes. Contrast: Before injecting any contrast, we confirmed that the patient did not have an allergy to iodine, shellfish, or radiological contrast. Once satisfactory needle placement was completed at the desired level, radiological contrast was injected. Contrast injected under live fluoroscopy. No contrast complications. See chart for type and volume of contrast used. Fluoroscopic Guidance: I was personally present during the use of fluoroscopy. "Tunnel Vision Technique" used to obtain the best possible view of the target area. Parallax error corrected before commencing the procedure. "Direction-depth-direction" technique used to introduce the needle under continuous pulsed fluoroscopy. Once target was reached, antero-posterior, oblique, and lateral fluoroscopic projection used confirm needle placement in all planes. Images permanently stored in EMR. Interpretation: I personally interpreted the imaging intraoperatively. Adequate needle placement confirmed in multiple planes. Appropriate spread of contrast into desired area was observed. No evidence of afferent or efferent intravascular uptake. Permanent images saved into the patient's record.  Description of Procedure #2:  Target Area: Superior, posterior,  aspect of the sacroiliac fissure Approach: Posterior, paraspinal, ipsilateral approach. Area Prepped: Entire Lower Lumbosacral Region Prepping solution: ChloraPrep (2% chlorhexidine gluconate and 70% isopropyl alcohol) Safety Precautions: Aspiration looking for blood return was conducted prior to all injections. At no point did we inject any substances, as a needle was being advanced. No attempts were made at seeking any paresthesias. Safe injection practices and needle disposal techniques used. Medications properly checked for expiration dates. SDV (single dose vial) medications used. Description of the Procedure: Protocol guidelines were followed. The patient was placed in position over the procedure table. The target area was identified and the area prepped in the usual manner. Skin & deeper tissues infiltrated with local anesthetic. Appropriate amount of time allowed to pass for local anesthetics to take effect. The procedure needle was advanced under fluoroscopic guidance into the sacroiliac joint until a firm endpoint was obtained. Proper needle placement secured. Negative aspiration confirmed. Solution injected in intermittent fashion, asking for systemic symptoms every 0.5cc of injectate. The needles were then removed and the area cleansed, making sure to leave some of the prepping solution back to take advantage of its long term bactericidal properties. There were no vitals filed for this visit.  Start Time:   hrs. End Time:   hrs. Materials:  Needle(s) Type: Spinal Needle Gauge: 22G Length: 3.5-in Medication(s): Please see orders for medications and dosing details.  Imaging Guidance (Non-Spinal) for procedure #2:  Type of Imaging Technique: Fluoroscopy Guidance (Non-Spinal) Indication(s): Assistance in needle guidance and placement for procedures requiring needle placement in or near specific anatomical locations not easily accessible without such assistance. Exposure Time: Please see  nurses notes. Contrast: Before injecting any contrast, we confirmed that the patient did not have an allergy to iodine, shellfish, or radiological contrast. Once satisfactory needle placement was completed at the desired level, radiological contrast was injected. Contrast injected under live fluoroscopy. No contrast complications. See chart for type and volume of contrast used. Fluoroscopic Guidance: I was personally present during the use of fluoroscopy. "Tunnel Vision Technique" used to obtain the best possible view of the target area. Parallax error corrected before commencing the procedure. "Direction-depth-direction" technique used to introduce  the needle under continuous pulsed fluoroscopy. Once target was reached, antero-posterior, oblique, and lateral fluoroscopic projection used confirm needle placement in all planes. Images permanently stored in EMR. Interpretation: I personally interpreted the imaging intraoperatively. Adequate needle placement confirmed in multiple planes. Appropriate spread of contrast into desired area was observed. No evidence of afferent or efferent intravascular uptake. Permanent images saved into the patient's record.  Antibiotic Prophylaxis:   Anti-infectives (From admission, onward)   None     Indication(s): None identified  Post-operative Assessment:  Post-procedure Vital Signs:  Pulse/HCG Rate:    Temp:   Resp:   BP:   SpO2:    EBL: None  Complications: No immediate post-treatment complications observed by team, or reported by patient.  Note: The patient tolerated the entire procedure well. A repeat set of vitals were taken after the procedure and the patient was kept under observation following institutional policy, for this type of procedure. Post-procedural neurological assessment was performed, showing return to baseline, prior to discharge. The patient was provided with post-procedure discharge instructions, including a section on how to identify  potential problems. Should any problems arise concerning this procedure, the patient was given instructions to immediately contact us, at any time, without hesitation. In any case, we plan to contact the patient by telephone for a follow-up status report regarding this interventional procedure.  Comments:  No additional relevant information.  Plan of Care  Orders:  No orders of the defined types were placed in this encounter.  Medications ordered for procedure: No orders of the defined types were placed in this encounter.  Medications administered: Lucendia Herrlich B. Hence had no medications administered during this visit.  See the medical record for exact dosing, route, and time of administration.  Disposition: Discharge home  Discharge Date & Time: 01/20/2019;   hrs.   Follow-up plan:   No follow-ups on file.     Future Appointments  Date Time Provider Department Center  01/20/2019 10:15 AM Delano Metz, MD [facility] None  04/05/2019 10:45 AM Barbette Merino, NP [facility] None   Primary Care Physician: Jerl Mina, MD Location: [facility] Note by: Oswaldo Done, MD Date: 01/20/2019; Time: 7:20 AM  Disclaimer:  Medicine is not an Visual merchandiser. The only guarantee in medicine is that nothing is guaranteed. It is important to note that the decision to proceed with this intervention was based on the information collected from the patient. The Data and conclusions were drawn from the patient's questionnaire, the interview, and the physical examination. Because the information was provided in large part by the patient, it cannot be guaranteed that it has not been purposely or unconsciously manipulated. Every effort has been made to obtain as much relevant data as possible for this evaluation. It is important to note that the conclusions that lead to this procedure are derived in large part from the available data. Always take into account that the treatment  will also be dependent on availability of resources and existing treatment guidelines, considered by other Pain Management Practitioners as being common knowledge and practice, at the time of the intervention. For Medico-Legal purposes, it is also important to point out that variation in procedural techniques and pharmacological choices are the acceptable norm. The indications, contraindications, technique, and results of the above procedure should only be interpreted and judged by a Board-Certified Interventional Pain Specialist with extensive familiarity and expertise in the same exact procedure and technique.

## 2019-01-27 ENCOUNTER — Ambulatory Visit (HOSPITAL_BASED_OUTPATIENT_CLINIC_OR_DEPARTMENT_OTHER): Payer: PPO | Admitting: Pain Medicine

## 2019-01-27 ENCOUNTER — Ambulatory Visit
Admission: RE | Admit: 2019-01-27 | Discharge: 2019-01-27 | Disposition: A | Discharge status: Home / Self Care | Payer: PPO | Source: Ambulatory Visit | Attending: Pain Medicine | Admitting: Pain Medicine

## 2019-01-27 ENCOUNTER — Other Ambulatory Visit: Payer: Self-pay

## 2019-01-27 ENCOUNTER — Encounter: Payer: Self-pay | Admitting: Pain Medicine

## 2019-01-27 VITALS — BP 151/72 | HR 82 | Temp 98.5°F | Resp 21 | Ht 63.0 in | Wt 165.0 lb

## 2019-01-27 DIAGNOSIS — M533 Sacrococcygeal disorders, not elsewhere classified: Secondary | ICD-10-CM | POA: Insufficient documentation

## 2019-01-27 DIAGNOSIS — M1612 Unilateral primary osteoarthritis, left hip: Secondary | ICD-10-CM | POA: Diagnosis not present

## 2019-01-27 DIAGNOSIS — G8929 Other chronic pain: Secondary | ICD-10-CM | POA: Diagnosis not present

## 2019-01-27 DIAGNOSIS — M5388 Other specified dorsopathies, sacral and sacrococcygeal region: Secondary | ICD-10-CM | POA: Diagnosis not present

## 2019-01-27 DIAGNOSIS — M25552 Pain in left hip: Secondary | ICD-10-CM

## 2019-01-27 MED ORDER — METHYLPREDNISOLONE ACETATE 80 MG/ML IJ SUSP
80.0000 mg | Freq: Once | INTRAMUSCULAR | Status: AC
  Administered 2019-01-27: 80 mg via INTRA_ARTICULAR

## 2019-01-27 MED ORDER — METHYLPREDNISOLONE ACETATE 80 MG/ML IJ SUSP
INTRAMUSCULAR | Status: AC
  Filled 2019-01-27: qty 2

## 2019-01-27 MED ORDER — ROPIVACAINE HCL 2 MG/ML IJ SOLN
4.0000 mL | Freq: Once | INTRAMUSCULAR | Status: AC
  Administered 2019-01-27: 4 mL via INTRA_ARTICULAR

## 2019-01-27 MED ORDER — IOPAMIDOL (ISOVUE-M 200) INJECTION 41%
10.0000 mL | Freq: Once | INTRAMUSCULAR | Status: AC
  Administered 2019-01-27: 10 mL via EPIDURAL
  Filled 2019-01-27: qty 10

## 2019-01-27 MED ORDER — LIDOCAINE HCL 2 % IJ SOLN
20.0000 mL | Freq: Once | INTRAMUSCULAR | Status: AC
  Administered 2019-01-27: 400 mg

## 2019-01-27 MED ORDER — LIDOCAINE HCL 2 % IJ SOLN
INTRAMUSCULAR | Status: AC
  Filled 2019-01-27: qty 20

## 2019-01-27 MED ORDER — ROPIVACAINE HCL 2 MG/ML IJ SOLN
INTRAMUSCULAR | Status: AC
  Filled 2019-01-27: qty 10

## 2019-01-27 NOTE — Progress Notes (Signed)
Safety precautions to be maintained throughout the outpatient stay will include: orient to surroundings, keep bed in low position, maintain call bell within reach at all times, provide assistance with transfer out of bed and ambulation.  

## 2019-01-27 NOTE — Progress Notes (Signed)
Patient's Name: Cynthia Bennett  MRN: [id number]  Referring Provider: Jerl MinaHedrick, James, MD  DOB: [birth year]  PCP: Jerl MinaHedrick, James, MD  DOS: 01/27/2019  Note by: Oswaldo DoneFrancisco A Pricilla Moehle, MD  Service setting: Ambulatory outpatient  Specialty: Interventional Pain Management  Patient type: Established  Location: [facility] (AMB) Pain Management Facility  Visit type: Interventional Procedure   Primary Reason for Visit: Interventional Pain Management Treatment. CC: Back Pain (low left)  Procedure #1:  Anesthesia, Analgesia, Anxiolysis:  Type: Intra-Articular Hip Injection          Primary Purpose: Diagnostic Region: Posterolateral hip joint area. Level: Lower pelvic and hip joint level. Target Area: Superior aspect of the hip joint cavity, going thru the superior portion of the capsular ligament. Approach: Posterolateral approach. Laterality: Left  Type: Local Anesthesia Indication(s): Analgesia         Route: Infiltration ([place]/IM) IV Access: Declined Sedation: Declined  Local Anesthetic: Lidocaine 1-2%  Position: Prone Prepped Area: Entire Posterolateral hip area. Prepping solution: ChloraPrep (2% chlorhexidine gluconate and 70% isopropyl alcohol)   Indications: 1. Chronic hip pain (Left)   2. Osteoarthritis of hip (Left)    Procedure #2:  Anesthesia, Analgesia, Anxiolysis:  Type: Diagnostic Sacroiliac Joint Steroid Injection          Region: Superior Lumbosacral Region Level: PSIS (Posterior Superior Iliac Spine) Laterality: Left  Type: Local Anesthesia Indication(s): Analgesia         Route: Infiltration ([place]/IM) IV Access: Declined Sedation: Declined  Local Anesthetic: Lidocaine 1-2%  Position: Prone           Indications: 1. Chronic sacroiliac joint pain (Left)   2. Other specified dorsopathies, sacral and sacrococcygeal region    Pain Score: Pre-procedure: 8 /10 Post-procedure: 0-No pain/10  Pre-op Assessment:  Cynthia Bennett is a 85 y.o. (year old), female patient, seen today for  interventional treatment. She  has a past surgical history that includes Esophagogastroduodenoscopy (N/A, 04/16/2015); Appendectomy; and Abdominal hysterectomy. Cynthia Bennett has a current medication list which includes the following prescription(s): acetaminophen, bupropion, dorzolamide-timolol, doxepin, meclizine, multivitamin, oxycodone, oxycodone, oxycodone, pantoprazole, sertraline, and zolpidem. Her primarily concern today is the Back Pain (low left)  Initial Vital Signs:  Pulse/HCG Rate: 88  Temp: 98.5 F (36.9 C) Resp: 16 BP: (!) 153/75 SpO2: 100 %  BMI: Estimated body mass index is 29.23 kg/m as calculated from the following:   Height as of this encounter: 5\' 3"  (1.6 m).   Weight as of this encounter: 165 lb (74.8 kg).  Risk Assessment: Allergies: Reviewed. She is allergic to ibuprofen and nsaids.  Allergy Precautions: None required Coagulopathies: Reviewed. None identified.  Blood-thinner therapy: None at this time Active Infection(s): Reviewed. None identified. Cynthia Bennett is afebrile  Site Confirmation: Cynthia Bennett was asked to confirm the procedure and laterality before marking the site Procedure checklist: Completed Consent: Before the procedure and under the influence of no sedative(s), amnesic(s), or anxiolytics, the patient was informed of the treatment options, risks and possible complications. To fulfill our ethical and legal obligations, as recommended by the American Medical Association's Code of Ethics, I have informed the patient of my clinical impression; the nature and purpose of the treatment or procedure; the risks, benefits, and possible complications of the intervention; the alternatives, including doing nothing; the risk(s) and benefit(s) of the alternative treatment(s) or procedure(s); and the risk(s) and benefit(s) of doing nothing. The patient was provided information about the general risks and possible complications associated with the procedure. These may include, but  are not  limited to: failure to achieve desired goals, infection, bleeding, organ or nerve damage, allergic reactions, paralysis, and death. In addition, the patient was informed of those risks and complications associated to the procedure, such as failure to decrease pain; infection; bleeding; organ or nerve damage with subsequent damage to sensory, motor, and/or autonomic systems, resulting in permanent pain, numbness, and/or weakness of one or several areas of the body; allergic reactions; (i.e.: anaphylactic reaction); and/or death. Furthermore, the patient was informed of those risks and complications associated with the medications. These include, but are not limited to: allergic reactions (i.e.: anaphylactic or anaphylactoid reaction(s)); adrenal axis suppression; blood sugar elevation that in diabetics may result in ketoacidosis or comma; water retention that in patients with history of congestive heart failure may result in shortness of breath, pulmonary edema, and decompensation with resultant heart failure; weight gain; swelling or edema; medication-induced neural toxicity; particulate matter embolism and blood vessel occlusion with resultant organ, and/or nervous system infarction; and/or aseptic necrosis of one or more joints. Finally, the patient was informed that Medicine is not an exact science; therefore, there is also the possibility of unforeseen or unpredictable risks and/or possible complications that may result in a catastrophic outcome. The patient indicated having understood very clearly. We have given the patient no guarantees and we have made no promises. Enough time was given to the patient to ask questions, all of which were answered to the patient's satisfaction. Ms. Bertelson has indicated that she wanted to continue with the procedure. Attestation: I, the ordering provider, attest that I have discussed with the patient the benefits, risks, side-effects, alternatives, likelihood of  achieving goals, and potential problems during recovery for the procedure that I have provided informed consent. Date  Time: 01/27/2019 12:45 PM  Pre-Procedure Preparation:  Monitoring: As per clinic protocol. Respiration, ETCO2, SpO2, BP, heart rate and rhythm monitor placed and checked for adequate function Safety Precautions: Patient was assessed for positional comfort and pressure points before starting the procedure. Time-out: I initiated and conducted the "Time-out" before starting the procedure, as per protocol. The patient was asked to participate by confirming the accuracy of the "Time Out" information. Verification of the correct person, site, and procedure were performed and confirmed by me, the nursing staff, and the patient. "Time-out" conducted as per Joint Commission's Universal Protocol (UP.01.01.01). Time: 1312  Description of Procedure #1:  Safety Precautions: Aspiration looking for blood return was conducted prior to all injections. At no point did we inject any substances, as a needle was being advanced. No attempts were made at seeking any paresthesias. Safe injection practices and needle disposal techniques used. Medications properly checked for expiration dates. SDV (single dose vial) medications used. Description of the Procedure: Protocol guidelines were followed. The patient was placed in position over the fluoroscopy table. The target area was identified and the area prepped in the usual manner. Skin & deeper tissues infiltrated with local anesthetic. Appropriate amount of time allowed to pass for local anesthetics to take effect. The procedure needles were then advanced to the target area. Proper needle placement secured. Negative aspiration confirmed. Solution injected in intermittent fashion, asking for systemic symptoms every 0.5cc of injectate. The needles were then removed and the area cleansed, making sure to leave some of the prepping solution back to take advantage of  its long term bactericidal properties. Vitals:   01/27/19 1243 01/27/19 1314 01/27/19 1320 01/27/19 1326  BP: (!) 153/75 (!) 143/75 (!) 146/69 (!) 151/72  Pulse: 88 77 83 82  Resp: 16 (!) 21 (!) 23 (!) 21  Temp: 98.5 F (36.9 C)     SpO2: 100% 99% 98% 99%  Weight: 165 lb (74.8 kg)     Height:  (1.6 m)       Start Time: 1312 hrs. End Time: 1323 hrs. Materials:  Needle(s) Type: Spinal Needle Gauge: 22G Length: 7-in Medication(s): Please see orders for medications and dosing details.  Imaging Guidance (Non-Spinal) for procedure #1:  Type of Imaging Technique: Fluoroscopy Guidance (Non-Spinal) Indication(s): Assistance in needle guidance and placement for procedures requiring needle placement in or near specific anatomical locations not easily accessible without such assistance. Exposure Time: Please see nurses notes. Contrast: Before injecting any contrast, we confirmed that the patient did not have an allergy to iodine, shellfish, or radiological contrast. Once satisfactory needle placement was completed at the desired level, radiological contrast was injected. Contrast injected under live fluoroscopy. No contrast complications. See chart for type and volume of contrast used. Fluoroscopic Guidance: I was personally present during the use of fluoroscopy. "Tunnel Vision Technique" used to obtain the best possible view of the target area. Parallax error corrected before commencing the procedure. "Direction-depth-direction" technique used to introduce the needle under continuous pulsed fluoroscopy. Once target was reached, antero-posterior, oblique, and lateral fluoroscopic projection used confirm needle placement in all planes. Images permanently stored in EMR. Interpretation: I personally interpreted the imaging intraoperatively. Adequate needle placement confirmed in multiple planes. Appropriate spread of contrast into desired area was observed. No evidence of afferent or efferent  intravascular uptake. Permanent images saved into the patient's record.  Description of Procedure #2:  Target Area: Superior, posterior, aspect of the sacroiliac fissure Approach: Posterior, paraspinal, ipsilateral approach. Area Prepped: Entire Lower Lumbosacral Region Prepping solution: ChloraPrep (2% chlorhexidine gluconate and 70% isopropyl alcohol) Safety Precautions: Aspiration looking for blood return was conducted prior to all injections. At no point did we inject any substances, as a needle was being advanced. No attempts were made at seeking any paresthesias. Safe injection practices and needle disposal techniques used. Medications properly checked for expiration dates. SDV (single dose vial) medications used. Description of the Procedure: Protocol guidelines were followed. The patient was placed in position over the procedure table. The target area was identified and the area prepped in the usual manner. Skin & deeper tissues infiltrated with local anesthetic. Appropriate amount of time allowed to pass for local anesthetics to take effect. The procedure needle was advanced under fluoroscopic guidance into the sacroiliac joint until a firm endpoint was obtained. Proper needle placement secured. Negative aspiration confirmed. Solution injected in intermittent fashion, asking for systemic symptoms every 0.5cc of injectate. The needles were then removed and the area cleansed, making sure to leave some of the prepping solution back to take advantage of its long term bactericidal properties. Vitals:   01/27/19 1243 01/27/19 1314 01/27/19 1320 01/27/19 1326  BP: (!) 153/75 (!) 143/75 (!) 146/69 (!) 151/72  Pulse: 88 77 83 82  Resp: 16 (!) 21 (!) 23 (!) 21  Temp: 98.5 F (36.9 C)     SpO2: 100% 99% 98% 99%  Weight: 165 lb (74.8 kg)     Height:  (1.6 m)       Start Time: 1312 hrs. End Time: 1323 hrs. Materials:  Needle(s) Type: Spinal Needle Gauge: 22G Length: 5-in Medication(s):  Please see orders for medications and dosing details.  Imaging Guidance (Non-Spinal) for procedure #2:  Type of Imaging Technique: Fluoroscopy Guidance (Non-Spinal) Indication(s): Assistance in needle  guidance and placement for procedures requiring needle placement in or near specific anatomical locations not easily accessible without such assistance. Exposure Time: Please see nurses notes. Contrast: Before injecting any contrast, we confirmed that the patient did not have an allergy to iodine, shellfish, or radiological contrast. Once satisfactory needle placement was completed at the desired level, radiological contrast was injected. Contrast injected under live fluoroscopy. No contrast complications. See chart for type and volume of contrast used. Fluoroscopic Guidance: I was personally present during the use of fluoroscopy. "Tunnel Vision Technique" used to obtain the best possible view of the target area. Parallax error corrected before commencing the procedure. "Direction-depth-direction" technique used to introduce the needle under continuous pulsed fluoroscopy. Once target was reached, antero-posterior, oblique, and lateral fluoroscopic projection used confirm needle placement in all planes. Images permanently stored in EMR. Interpretation: I personally interpreted the imaging intraoperatively. Adequate needle placement confirmed in multiple planes. Appropriate spread of contrast into desired area was observed. No evidence of afferent or efferent intravascular uptake. Permanent images saved into the patient's record.  Antibiotic Prophylaxis:   Anti-infectives (From admission, onward)   None     Indication(s): None identified  Post-operative Assessment:  Post-procedure Vital Signs:  Pulse/HCG Rate: 82  Temp: 98.5 F (36.9 C) Resp: (!) 21 BP: (!) 151/72 SpO2: 99 %  EBL: None  Complications: No immediate post-treatment complications observed by team, or reported by patient.  Note:  The patient tolerated the entire procedure well. A repeat set of vitals were taken after the procedure and the patient was kept under observation following institutional policy, for this type of procedure. Post-procedural neurological assessment was performed, showing return to baseline, prior to discharge. The patient was provided with post-procedure discharge instructions, including a section on how to identify potential problems. Should any problems arise concerning this procedure, the patient was given instructions to immediately contact us, at any time, without hesitation. In any case, we plan to contact the patient by telephone for a follow-up status report regarding this interventional procedure.  Comments:  No additional relevant information.  Plan of Care  Orders:  Orders Placed This Encounter  Procedures  . HIP INJECTION    Scheduling Instructions:     Side: Left-sided     Sedation: No Sedation.     Timeframe: Today  . SACROILIAC JOINT INJECTION    Scheduling Instructions:     Side: Left-sided     Sedation: No Sedation.     Timeframe: Today    Order Specific Question:   Where will this procedure be performed?    Answer:   [facility]  . DG C-Arm 1-60 Min-No Report    Intraoperative interpretation by procedural physician at [facility].    Standing Status:   Standing    Number of Occurrences:   1    Order Specific Question:   Reason for exam:    Answer:   Assistance in needle guidance and placement for procedures requiring needle placement in or near specific anatomical locations not easily accessible without such assistance.  . Provider attestation of informed consent for procedure/surgical case    I, the ordering provider, attest that I have discussed with the patient the benefits, risks, side effects, alternatives, likelihood of achieving goals and potential problems during recovery for the procedure that I have provided informed consent.    Standing  Status:   Standing    Number of Occurrences:   1  . Informed Consent Details: Transcribe to consent form and  obtain patient signature    Consent Attestation: I, the ordering provider, attest that I have discussed with the patient the benefits, risks, side-effects, alternatives, likelihood of achieving goals, and potential problems during recovery for the procedure that I have provided informed consent.    Standing Status:   Standing    Number of Occurrences:   1    Order Specific Question:   Procedure    Answer:   Left-sided intra-articular hip joint injection under fluoroscopic guidance    Order Specific Question:   Surgeon    Answer:   Vue Pavon A. Laban Emperor, MD    Order Specific Question:   Indication/Reason    Answer:   Left hip pain secondary to osteoarthritis of the hip  . Informed Consent Details: Transcribe to consent form and obtain patient signature    Consent Attestation: I, the ordering provider, attest that I have discussed with the patient the benefits, risks, side-effects, alternatives, likelihood of achieving goals, and potential problems during recovery for the procedure that I have provided informed consent.    Standing Status:   Standing    Number of Occurrences:   1    Order Specific Question:   Procedure    Answer:   Diagnostic, left sided, sacroiliac joint block under fluoroscopic guidance    Order Specific Question:   Surgeon    Answer:   Alicja Everitt A. Laban Emperor, MD    Order Specific Question:   Indication/Reason    Answer:   Low back pain secondary sleep to sacroiliac joint dysfunction/arthralgia   Medications ordered for procedure: Meds ordered this encounter  Medications  . iopamidol (ISOVUE-M) 41 % intrathecal injection 10 mL    Must be Myelogram-compatible. If not available, you may substitute with a water-soluble, non-ionic, hypoallergenic, myelogram-compatible radiological contrast medium.  Marland Kitchen lidocaine (XYLOCAINE) 2 % (with pres) injection 400 mg  . ropivacaine  (PF) 2 mg/mL (0.2%) (NAROPIN) injection 4 mL  . methylPREDNISolone acetate (DEPO-MEDROL) injection 80 mg  . ropivacaine (PF) 2 mg/mL (0.2%) (NAROPIN) injection 4 mL  . methylPREDNISolone acetate (DEPO-MEDROL) injection 80 mg   Medications administered: We administered iopamidol, lidocaine, ropivacaine (PF) 2 mg/mL (0.2%), methylPREDNISolone acetate, ropivacaine (PF) 2 mg/mL (0.2%), and methylPREDNISolone acetate.  See the medical record for exact dosing, route, and time of administration.  Disposition: Discharge home  Discharge Date & Time: 01/27/2019; 1327 hrs.   Follow-up plan:   Return for EPP (2 wks) w/ Dr. Laban Emperor.     Future Appointments  Date Time Provider Department Center  02/21/2019  1:00 PM Delano Metz, MD [facility] None  04/05/2019 10:45 AM Barbette Merino, NP [facility] None   Primary Care Physician: Jerl Mina, MD Location: [facility] Note by: Oswaldo Done, MD Date: 01/27/2019; Time: 1:40 PM  Disclaimer:  Medicine is not an exact science. The only guarantee in medicine is that nothing is guaranteed. It is important to note that the decision to proceed with this intervention was based on the information collected from the patient. The Data and conclusions were drawn from the patient's questionnaire, the interview, and the physical examination. Because the information was provided in large part by the patient, it cannot be guaranteed that it has not been purposely or unconsciously manipulated. Every effort has been made to obtain as much relevant data as possible for this evaluation. It is important to note that the conclusions that lead to this procedure are derived in large part from the available data. Always take into account that the  treatment will also be dependent on availability of resources and existing treatment guidelines, considered by other Pain Management Practitioners as being common knowledge and practice, at the time of  the intervention. For Medico-Legal purposes, it is also important to point out that variation in procedural techniques and pharmacological choices are the acceptable norm. The indications, contraindications, technique, and results of the above procedure should only be interpreted and judged by a Board-Certified Interventional Pain Specialist with extensive familiarity and expertise in the same exact procedure and technique.

## 2019-01-27 NOTE — Patient Instructions (Signed)

## 2019-01-28 ENCOUNTER — Telehealth: Payer: Self-pay

## 2019-01-28 NOTE — Telephone Encounter (Signed)
Post procedure phone call. Patient states she is doing good.  

## 2019-02-04 DIAGNOSIS — E538 Deficiency of other specified B group vitamins: Secondary | ICD-10-CM | POA: Diagnosis not present

## 2019-02-17 DIAGNOSIS — R300 Dysuria: Secondary | ICD-10-CM | POA: Diagnosis not present

## 2019-02-17 DIAGNOSIS — H04123 Dry eye syndrome of bilateral lacrimal glands: Secondary | ICD-10-CM | POA: Diagnosis not present

## 2019-02-17 DIAGNOSIS — H527 Unspecified disorder of refraction: Secondary | ICD-10-CM | POA: Diagnosis not present

## 2019-02-17 DIAGNOSIS — H401132 Primary open-angle glaucoma, bilateral, moderate stage: Secondary | ICD-10-CM | POA: Diagnosis not present

## 2019-02-17 DIAGNOSIS — H43393 Other vitreous opacities, bilateral: Secondary | ICD-10-CM | POA: Diagnosis not present

## 2019-02-20 NOTE — Progress Notes (Signed)
Pain Management Virtual Encounter Note - Virtual Visit via Telephone Telehealth (real-time audio visits between healthcare provider and patient).  Patient's Phone No. & Preferred Pharmacy:  [phone] (home); There is no such number on file (mobile).[phone]) [phone]  [id number] Cynthia Bennett, [place] - [address] AT [facility] [address] [place] [address] [place] [address] Phone: [phone] Fax: [phone]   Pre-screening note:  Our staff contacted Cynthia Bennett and offered her an "in person", "face-to-face" appointment versus a telephone encounter. She indicated preferring the telephone encounter, at this time.  Reason for Virtual Visit: COVID-19*  Social distancing based on CDC and AMA recommendations.   I contacted Cynthia Bennett on 02/21/2019 at 2:36 PM by telephone and clearly identified myself as Cynthia Done, MD. I verified that I was speaking with the correct person using two identifiers (Name and date of birth: 1933-07-31).  Advanced Informed Consent I sought verbal advanced consent from Cynthia Bennett for telemedicine interactions and virtual visit. I informed Cynthia Bennett of the security and privacy concerns, risks, and limitations associated with performing an evaluation and management service by telephone. I also informed Cynthia Bennett of the availability of "in person" appointments and I informed her of the possibility of a patient responsible charge related to this service. Cynthia Bennett expressed understanding and agreed to proceed.   Historic Elements   Cynthia Bennett is a 85 y.o. year old, female patient evaluated today after her last encounter by our practice on 01/28/2019. Cynthia Bennett  has a past medical history of Dizzy spells, Fibromyalgia, Glaucoma, Hiatal hernia, Osteoarthritis, Osteoarthritis of spine with radiculopathy, lumbar region (08/14/2015), Peptic ulcer disease, Reflux, Spinal stenosis, Stroke (HCC), and TIA (transient ischemic attack). She also   has a past surgical history that includes Esophagogastroduodenoscopy (N/A, 04/16/2015); Appendectomy; and Abdominal hysterectomy. Cynthia Bennett has a current medication list which includes the following prescription(s): acetaminophen, bupropion, dorzolamide-timolol, doxepin, meclizine, multivitamin, oxycodone, oxycodone, oxycodone, pantoprazole, sertraline, and zolpidem. She  reports that she has quit smoking. She has never used smokeless tobacco. She reports that she does not drink alcohol or use drugs. Cynthia Bennett is allergic to ibuprofen and nsaids.   HPI  I last saw her on 01/27/2019. She is being evaluated for a post-procedure assessment.  Post-Procedure Evaluation  Procedure: Palliative left-sided intra-articular hip joint injection + diagnostic left sacroiliac joint block under fluoroscopic guidance, no sedation Pre-procedure pain level:  8/10 Post-procedure: 0/10 (100% relief)  Sedation: None.  Effectiveness during initial hour after procedure(Ultra-Short Term Relief): 100 %  Local anesthetic used: Long-acting (4-6 hours) Effectiveness: Defined as any analgesic benefit obtained secondary to the administration of local anesthetics. This carries significant diagnostic value as to the etiological location, or anatomical origin, of the pain. Duration of benefit is expected to coincide with the duration of the local anesthetic used.  Effectiveness during initial 4-6 hours after procedure(Short-Term Relief): 100 %  Long-term benefit: Defined as any relief past the pharmacologic duration of the local anesthetics.  Effectiveness past the initial 6 hours after procedure(Long-Term Relief): 100 %  Current benefits: Defined as benefit that persist at this time.   Analgesia:  100% ongoing relief. Function: Cynthia Bennett reports improvement in function ROM: Cynthia Bennett reports improvement in ROM  Review of recent tests  DG C-Arm 1-60 Min-No Report Fluoroscopy was utilized by the requesting physician.  No  radiographic  interpretation.    Office Visit on 01/10/2019  Component Date Value  Ref Range Status  . Glucose 01/10/2019 84  65 - 99 mg/dL Final  . BUN 16/10/960403/12/2018 14  8 - 27 mg/dL Final  . Creatinine, Ser 01/10/2019 1.26* 0.57 - 1.00 mg/dL Final  . GFR calc non Af Amer 01/10/2019 39* >59 mL/min/1.73 Final  . GFR calc Af Amer 01/10/2019 46* >59 mL/min/1.73 Final  . BUN/Creatinine Ratio 01/10/2019 11* 12 - 28 Final  . Sodium 01/10/2019 141  134 - 144 mmol/L Final  . Potassium 01/10/2019 4.7  3.5 - 5.2 mmol/L Final  . Chloride 01/10/2019 104  96 - 106 mmol/L Final  . Calcium 01/10/2019 9.5  8.7 - 10.3 mg/dL Final  . Total Protein 01/10/2019 7.1  6.0 - 8.5 g/dL Final  . Albumin 54/09/811903/12/2018 4.3  3.6 - 4.6 g/dL Final  . Globulin, Total 01/10/2019 2.8  1.5 - 4.5 g/dL Final  . Albumin/Globulin Ratio 01/10/2019 1.5  1.2 - 2.2 Final  . Bilirubin Total 01/10/2019 0.3  0.0 - 1.2 mg/dL Final  . Alkaline Phosphatase 01/10/2019 74  39 - 117 IU/L Final  . AST 01/10/2019 18  0 - 40 IU/L Final  . Magnesium 01/10/2019 2.3  1.6 - 2.3 mg/dL Final  . Vitamin J-47B-12 82/95/621303/12/2018 1,471* 232 - 1,245 pg/mL Final  . Sed Rate 01/10/2019 51* 0 - 40 mm/hr Final  . 25-Hydroxy, Vitamin D 01/10/2019 26* ng/mL Final   Comment: Reference Range: All Ages: Target levels 30 - 100   . 25-Hydroxy, Vitamin D-2 01/10/2019 1.6  ng/mL Final  . 25-Hydroxy, Vitamin D-3 01/10/2019 24  ng/mL Final  . CRP 01/10/2019 2  0 - 10 mg/L Final  . Summary 01/10/2019 FINAL   Final   Comment: ==================================================================== TOXASSURE SELECT 13 (MW) ==================================================================== Test                             Result       Flag       Units Drug Present and Declared for Prescription Verification   Noroxycodone                   500          EXPECTED   ng/mg creat    Noroxycodone is an expected metabolite of oxycodone. Sources of    oxycodone include scheduled  prescription medications. Drug Absent but Declared for Prescription Verification   Oxycodone                      Not Detected UNEXPECTED ng/mg creat    Oxycodone is almost always present in patients taking this drug    consistently.  Absence of oxycodone could be due to lapse of time    since the last dose or unusual pharmacokinetics (rapid    metabolism). ==================================================================== Test                      Result    Flag   Units      Ref Range   Creatinine              128                                        mg/dL      >=08>=20 ==================================================================== Declared Medications:  The flagging and interpretation on this report are based on the  following declared medications.  Unexpected results may arise from  inaccuracies in the declared medications.  **Note: The testing scope of this panel includes these medications:  Oxycodone  **Note: The testing scope of this panel does not include following  reported medications:  Acetaminophen (Tylenol)  Bupropion (Wellbutrin)  Dorzolamide  Doxepin  Meclizine  Multivitamin  Pantoprazole (Protonix)  Sertraline (Zoloft)  Zolpidem (Ambien) ==================================================================== For clinical consultation, please call [phone]. ====================================================================    Assessment  The primary encounter diagnosis was Chronic pain syndrome. Diagnoses of Chronic low back pain (Primary Source of Pain) (Bilateral) (L>R) and Chronic lower extremity pain (Secondary source of pain) (Left) were also pertinent to this visit.  Plan of Care  I am having Cynthia Bennett maintain her doxepin, dorzolamide-timolol, buPROPion, zolpidem, pantoprazole, sertraline, meclizine, acetaminophen, multivitamin, oxyCODONE, oxyCODONE, and oxyCODONE.  Pharmacotherapy (Medications Ordered): No orders of the defined types were  placed in this encounter.  Orders:  Orders Placed This Encounter  Procedures  . Schedule appointment    Scheduling Instructions:     Schedule the patient to have all medication management appointments and medication refills with Thayer Jew, NP   Follow-up plan:   Return for Med-Mgmt w/ NP.   I discussed the assessment and treatment plan with the patient. The patient was provided an opportunity to ask questions and all were answered. The patient agreed with the plan and demonstrated an understanding of the instructions.  Patient advised to call back or seek an in-person evaluation if the symptoms or condition worsens.  Total duration of non-face-to-face encounter: 11 minutes.  Note by: Cynthia Done, MD Date: 02/21/2019; Time: 2:36 PM  Disclaimer:  * Given the special circumstances of the COVID-19 pandemic, the federal government has announced that the Office for Civil Rights (OCR) will exercise its enforcement discretion and will not impose penalties on physicians using telehealth in the event of noncompliance with regulatory requirements under the [employer] Portability and Accountability Act (HIPAA) in connection with the good faith provision of telehealth during the COVID-19 national public health emergency. (AMA)

## 2019-02-21 ENCOUNTER — Ambulatory Visit: Payer: PPO | Attending: Pain Medicine | Admitting: Pain Medicine

## 2019-02-21 ENCOUNTER — Other Ambulatory Visit: Payer: Self-pay

## 2019-02-21 DIAGNOSIS — F329 Major depressive disorder, single episode, unspecified: Secondary | ICD-10-CM | POA: Diagnosis not present

## 2019-02-21 DIAGNOSIS — K219 Gastro-esophageal reflux disease without esophagitis: Secondary | ICD-10-CM | POA: Diagnosis not present

## 2019-02-21 DIAGNOSIS — M545 Low back pain, unspecified: Secondary | ICD-10-CM

## 2019-02-21 DIAGNOSIS — M79605 Pain in left leg: Secondary | ICD-10-CM

## 2019-02-21 DIAGNOSIS — G8929 Other chronic pain: Secondary | ICD-10-CM

## 2019-02-21 DIAGNOSIS — G894 Chronic pain syndrome: Secondary | ICD-10-CM

## 2019-02-21 DIAGNOSIS — M797 Fibromyalgia: Secondary | ICD-10-CM | POA: Diagnosis not present

## 2019-02-21 DIAGNOSIS — Z0000 Encounter for general adult medical examination without abnormal findings: Secondary | ICD-10-CM | POA: Diagnosis not present

## 2019-02-21 DIAGNOSIS — E785 Hyperlipidemia, unspecified: Secondary | ICD-10-CM | POA: Diagnosis not present

## 2019-02-21 DIAGNOSIS — G4700 Insomnia, unspecified: Secondary | ICD-10-CM | POA: Diagnosis not present

## 2019-02-25 DIAGNOSIS — R3915 Urgency of urination: Secondary | ICD-10-CM | POA: Diagnosis not present

## 2019-03-18 DIAGNOSIS — E538 Deficiency of other specified B group vitamins: Secondary | ICD-10-CM | POA: Diagnosis not present

## 2019-04-04 NOTE — Progress Notes (Signed)
Pain Management Virtual Encounter Note - Virtual Visit via Telephone Telehealth (real-time audio visits between healthcare provider and patient).  Patient's Phone No. & Preferred Pharmacy:  [phone] (home); There is no such number on file (mobile).; (Preferred) [phone] [email or web address] .[name] [name], [place] - [address] AT [facility] [address] [place] [address] [place] [address] Phone: [phone] Fax: [phone]   Pre-screening note:  Our staff contacted Cynthia Bennett and offered her an "in person", "face-to-face" appointment versus a telephone encounter. She indicated preferring the telephone encounter, at this time.  Reason for Virtual Visit: COVID-19*  Social distancing based on CDC and AMA recommendations.   I contacted Marcell Escovedo Gritz on 04/05/2019 at 10:00 AM via telephone.      I clearly identified myself as Oswaldo Done, MD. I verified that I was speaking with the correct person using two identifiers (Name and date of birth: 11-11-1932).  Advanced Informed Consent I sought verbal advanced consent from [name] for virtual visit interactions. I informed Cynthia Bennett of possible security and privacy concerns, risks, and limitations associated with providing "not-in-person" medical evaluation and management services. I also informed Cynthia Bennett of the availability of "in-person" appointments. Finally, I informed her that there would be a charge for the virtual visit and that she could be  personally, fully or partially, financially responsible for it. [name] expressed understanding and agreed to proceed.   Historic Elements   Cynthia Bennett is a 85 y.o. year old, female patient evaluated today after her last encounter by our practice on 02/21/2019. [name]  has a past medical history of Dizzy spells, Fibromyalgia, Glaucoma, Hiatal hernia, Osteoarthritis, Osteoarthritis of spine with radiculopathy, lumbar region (08/14/2015), Peptic  ulcer disease, Reflux, Spinal stenosis, Stroke (HCC), and TIA (transient ischemic attack). She also  has a past surgical history that includes Esophagogastroduodenoscopy (N/A, 04/16/2015); Appendectomy; and Abdominal hysterectomy. [name] has a current medication list which includes the following prescription(s): acetaminophen, bupropion, dorzolamide-timolol, doxepin, meclizine, multivitamin, pantoprazole, sertraline, zolpidem, oxycodone, oxycodone, and oxycodone. She  reports that she has quit smoking. She has never used smokeless tobacco. She reports that she does not drink alcohol or use drugs. [name] is allergic to ibuprofen and nsaids.   HPI  I last communicated with her on 02/21/2019. Today, she is being contacted for medication management. Right hip is hurting.  She attributes this to lack of mobility.  She indicates that since COVID-19 restrictions were imposed, she has not gone out and her daily exercise regimen has been affected because of this.  She has indicated that she is not to the point where she needs any injections, but she is hurting a little bit more than usual.  She has been using only 1 pill/day but I have encouraged her to use 2/day, if she feels that it is causing her to decrease her level of activity.  She understood and accepted and she indicated that she will probably need to start using 2 pills/day, regularly.  Pharmacotherapy Assessment  Analgesic: Oxycodone IR 5 mg twice daily (10 mg/day of oxycodone) MME/day: 15 mg/day.   Monitoring: Pharmacotherapy: No side-effects or adverse reactions reported. [place] PMP: PDMP reviewed during this encounter.       Compliance: No problems identified. Effectiveness: Clinically acceptable. Plan: Refer to "POC".  Review of recent tests   Pertinent Labs  Renal Function Lab Results  Component Value Date   BUN 14 01/10/2019  CREATININE 1.26 (H) 01/10/2019   BCR 11 (L) 01/10/2019   GFRAA 46 (L) 01/10/2019   GFRNONAA 39 (L) 01/10/2019   Hepatic Function Lab Results  Component Value Date   AST 18 01/10/2019   ALT 11 (L) 06/24/2016   ALBUMIN 4.3 01/10/2019  Note: Above Lab results reviewed.  DG C-Arm 1-60 Min-No Report Fluoroscopy was utilized by the requesting physician.  No radiographic  interpretation.    Office Visit on 01/10/2019  Component Date Value Ref Range Status  . Glucose 01/10/2019 84  65 - 99 mg/dL Final  . BUN 16/08/9603 14  8 - 27 mg/dL Final  . Creatinine, Ser 01/10/2019 1.26* 0.57 - 1.00 mg/dL Final  . GFR calc non Af Amer 01/10/2019 39* >59 mL/min/1.73 Final  . GFR calc Af Amer 01/10/2019 46* >59 mL/min/1.73 Final  . BUN/Creatinine Ratio 01/10/2019 11* 12 - 28 Final  . Sodium 01/10/2019 141  134 - 144 mmol/L Final  . Potassium 01/10/2019 4.7  3.5 - 5.2 mmol/L Final  . Chloride 01/10/2019 104  96 - 106 mmol/L Final  . Calcium 01/10/2019 9.5  8.7 - 10.3 mg/dL Final  . Total Protein 01/10/2019 7.1  6.0 - 8.5 g/dL Final  . Albumin 54/07/8118 4.3  3.6 - 4.6 g/dL Final  . Globulin, Total 01/10/2019 2.8  1.5 - 4.5 g/dL Final  . Albumin/Globulin Ratio 01/10/2019 1.5  1.2 - 2.2 Final  . Bilirubin Total 01/10/2019 0.3  0.0 - 1.2 mg/dL Final  . Alkaline Phosphatase 01/10/2019 74  39 - 117 IU/L Final  . AST 01/10/2019 18  0 - 40 IU/L Final  . Magnesium 01/10/2019 2.3  1.6 - 2.3 mg/dL Final  . Vitamin J-47 82/95/6213 1,471* 232 - 1,245 pg/mL Final  . Sed Rate 01/10/2019 51* 0 - 40 mm/hr Final  . 25-Hydroxy, Vitamin D 01/10/2019 26* ng/mL Final   Comment: Reference Range: All Ages: Target levels 30 - 100   . 25-Hydroxy, Vitamin D-2 01/10/2019 1.6  ng/mL Final  . 25-Hydroxy, Vitamin D-3 01/10/2019 24  ng/mL Final  . CRP 01/10/2019 2  0 - 10 mg/L Final  . Summary 01/10/2019 FINAL   Final   Comment: ==================================================================== TOXASSURE SELECT 13 (MW) ==================================================================== Test                             Result        Flag       Units Drug Present and Declared for Prescription Verification   Noroxycodone                   500          EXPECTED   ng/mg creat    Noroxycodone is an expected metabolite of oxycodone. Sources of    oxycodone include scheduled prescription medications. Drug Absent but Declared for Prescription Verification   Oxycodone                      Not Detected UNEXPECTED ng/mg creat    Oxycodone is almost always present in patients taking this drug    consistently.  Absence of oxycodone could be due to lapse of time    since the last dose or unusual pharmacokinetics (rapid    metabolism). ==================================================================== Test                      Result    Flag   Units  Ref Range   Creatinine              128                                        mg/dL      >=16 ==================================================================== Declared Medications:  The flagging and interpretation on this report are based on the  following declared medications.  Unexpected results may arise from  inaccuracies in the declared medications.  **Note: The testing scope of this panel includes these medications:  Oxycodone  **Note: The testing scope of this panel does not include following  reported medications:  Acetaminophen (Tylenol)  Bupropion (Wellbutrin)  Dorzolamide  Doxepin  Meclizine  Multivitamin  Pantoprazole (Protonix)  Sertraline (Zoloft)  Zolpidem (Ambien) ==================================================================== For clinical consultation, please call [phone]. ====================================================================    Assessment  The primary encounter diagnosis was Chronic pain syndrome. Diagnoses of Chronic low back pain (Primary Source of Pain) (Bilateral) (L>R), Chronic lower extremity pain (Secondary source of pain) (Left), DDD (degenerative disc disease), lumbar, and Severe (5-6 mm) L4-5 lumbar spinal  stenosis and (8 mm) L3-4 spinal stenosis. were also pertinent to this visit.  Plan of Care  I have changed Hara B. Arrighi's oxyCODONE, oxyCODONE, and oxyCODONE. I am also having her maintain her doxepin, dorzolamide-timolol, buPROPion, zolpidem, pantoprazole, sertraline, meclizine, acetaminophen, and multivitamin.  Pharmacotherapy (Medications Ordered): Meds ordered this encounter  Medications  . oxyCODONE (OXY IR/ROXICODONE) 5 MG immediate release tablet    Sig: Take 1 tablet (5 mg total) by mouth every 12 (twelve) hours as needed for up to 30 days for severe pain. Must last 30 days    Dispense:  60 tablet    Refill:  0    Chronic Pain. (STOP Act - Not applicable). Fill one day early if closed on scheduled refill date. Do not fill until: 06/09/19. To last until: 07/09/19.  Marland Kitchen oxyCODONE (OXY IR/ROXICODONE) 5 MG immediate release tablet    Sig: Take 1 tablet (5 mg total) by mouth every 12 (twelve) hours as needed for up to 30 days for severe pain. Must last 30 days    Dispense:  60 tablet    Refill:  0    Chronic Pain. (STOP Act - Not applicable). Fill one day early if closed on scheduled refill date. Do not fill until: 05/10/19. To last until: 06/09/19.  Marland Kitchen oxyCODONE (OXY IR/ROXICODONE) 5 MG immediate release tablet    Sig: Take 1 tablet (5 mg total) by mouth every 12 (twelve) hours as needed for up to 30 days for severe pain. Must last 30 days    Dispense:  60 tablet    Refill:  0    Chronic Pain. (STOP Act - Not applicable). Fill one day early if closed on scheduled refill date. Do not fill until: 04/10/19. To last until: 05/10/19.   Orders:  No orders of the defined types were placed in this encounter.  Follow-up plan:   Return in about 3 months (around 07/04/2019) for (3 mo) Med-Mgmt.    I discussed the assessment and treatment plan with the patient. The patient was provided an opportunity to ask questions and all were answered. The patient agreed with the plan and demonstrated an  understanding of the instructions.  Patient advised to call back or seek an in-person evaluation if the symptoms or condition worsens.  Total duration of non-face-to-face encounter: 12 minutes.  Note by: Oswaldo DoneFrancisco A Enedelia Martorelli, MD Date: 04/05/2019; Time: 10:00 AM  Note: This dictation was prepared with Dragon dictation. Any transcriptional errors that may result from this process are unintentional.  Disclaimer:  * Given the special circumstances of the COVID-19 pandemic, the federal government has announced that the Office for Civil Rights (OCR) will exercise its enforcement discretion and will not impose penalties on physicians using telehealth in the event of noncompliance with regulatory requirements under the [employer] Portability and Accountability Act (HIPAA) in connection with the good faith provision of telehealth during the COVID-19 national public health emergency. (AMA)

## 2019-04-05 ENCOUNTER — Other Ambulatory Visit: Payer: Self-pay

## 2019-04-05 ENCOUNTER — Ambulatory Visit: Payer: PPO | Attending: Nurse Practitioner | Admitting: Pain Medicine

## 2019-04-05 ENCOUNTER — Encounter: Payer: Self-pay | Admitting: Pain Medicine

## 2019-04-05 DIAGNOSIS — M545 Low back pain: Secondary | ICD-10-CM

## 2019-04-05 DIAGNOSIS — M5136 Other intervertebral disc degeneration, lumbar region: Secondary | ICD-10-CM | POA: Diagnosis not present

## 2019-04-05 DIAGNOSIS — M48062 Spinal stenosis, lumbar region with neurogenic claudication: Secondary | ICD-10-CM | POA: Diagnosis not present

## 2019-04-05 DIAGNOSIS — M79605 Pain in left leg: Secondary | ICD-10-CM

## 2019-04-05 DIAGNOSIS — G8929 Other chronic pain: Secondary | ICD-10-CM | POA: Diagnosis not present

## 2019-04-05 DIAGNOSIS — G894 Chronic pain syndrome: Secondary | ICD-10-CM

## 2019-04-05 MED ORDER — OXYCODONE HCL 5 MG PO TABS: 5.0000 mg | ORAL_TABLET | Freq: Two times a day (BID) | ORAL | 0 refills | Status: DC | PRN

## 2019-07-04 ENCOUNTER — Encounter: Payer: PPO | Admitting: Pain Medicine

## 2019-07-06 DIAGNOSIS — E559 Vitamin D deficiency, unspecified: Secondary | ICD-10-CM | POA: Diagnosis not present

## 2019-07-06 DIAGNOSIS — K219 Gastro-esophageal reflux disease without esophagitis: Secondary | ICD-10-CM | POA: Diagnosis not present

## 2019-07-06 DIAGNOSIS — R5383 Other fatigue: Secondary | ICD-10-CM | POA: Diagnosis not present

## 2019-07-06 DIAGNOSIS — R5381 Other malaise: Secondary | ICD-10-CM | POA: Diagnosis not present

## 2019-07-06 DIAGNOSIS — Z0000 Encounter for general adult medical examination without abnormal findings: Secondary | ICD-10-CM | POA: Diagnosis not present

## 2019-07-06 DIAGNOSIS — E538 Deficiency of other specified B group vitamins: Secondary | ICD-10-CM | POA: Diagnosis not present

## 2019-07-06 DIAGNOSIS — R339 Retention of urine, unspecified: Secondary | ICD-10-CM | POA: Diagnosis not present

## 2019-07-07 ENCOUNTER — Encounter: Payer: Self-pay | Admitting: Pain Medicine

## 2019-07-10 DIAGNOSIS — M25552 Pain in left hip: Secondary | ICD-10-CM | POA: Insufficient documentation

## 2019-07-10 DIAGNOSIS — G8929 Other chronic pain: Secondary | ICD-10-CM | POA: Insufficient documentation

## 2019-07-10 NOTE — Progress Notes (Signed)
Pain Management Virtual Encounter Note - Virtual Visit via Telephone Telehealth (real-time audio visits between healthcare provider and patient).   Patient's Phone No. & Preferred Pharmacy:  [phone] (home); There is no such number on file (mobile).; (Preferred) [phone] [email or web address] .Cynthia Bennett  [id number] [name]- [place], [place] - [address] AT [facility] [address] [place] [id number] Phone: [phone] Fax: [phone]    Pre-screening note:  Our staff contacted [name] and offered her an "in person", "face-to-face" appointment versus a telephone encounter. She indicated preferring the telephone encounter, at this time.   Reason for Virtual Visit: COVID-19*  Social distancing based on CDC and AMA recommendations.   I contacted Cynthia HindsFaye B Acrey on 07/11/2019 via telephone.      I clearly identified myself as Oswaldo DoneFrancisco A Jader Desai, MD. I verified that I was speaking with the correct person using two identifiers (Name: Cynthia Bennett, and date of birth: 16-Jan-1933).  Advanced Informed Consent I sought verbal advanced consent from Cynthia HindsFaye B Kubitz for virtual visit interactions. I informed [name] of possible security and privacy concerns, risks, and limitations associated with providing "not-in-person" medical evaluation and management services. I also informed [name] of the availability of "in-person" appointments. Finally, I informed her that there would be a charge for the virtual visit and that she could be  personally, fully or partially, financially responsible for it. [name] expressed understanding and agreed to proceed.   Historic Elements   Ms. Cynthia HindsFaye B Suess is a 85 y.o. year old, female patient evaluated today after her last encounter by our practice on 04/05/2019. Cynthia Bennett  has a past medical history of Dizzy spells, Fibromyalgia, Glaucoma, Hiatal hernia, Osteoarthritis, Osteoarthritis of spine with radiculopathy, lumbar region (08/14/2015),  Peptic ulcer disease, Reflux, Spinal stenosis, Stroke (HCC), and TIA (transient ischemic attack). She also  has a past surgical history that includes Esophagogastroduodenoscopy (N/A, 04/16/2015); Appendectomy; and Abdominal hysterectomy. [name] has a current medication list which includes the following prescription(s): acetaminophen, bupropion, dorzolamide-timolol, doxepin, meclizine, multivitamin, oxycodone, oxycodone, oxycodone, pantoprazole, sertraline, and zolpidem. She  reports that she has quit smoking. She has never used smokeless tobacco. She reports that she does not drink alcohol or use drugs. [name] is allergic to ibuprofen and nsaids.   HPI  Today, she is being contacted for medication management.  The patient indicates doing well with the current medication regimen. No adverse reactions or side effects reported to the medications.  The patient indicates that her left lower extremity pain is worsening.  Is going down past the knee and into her foot.  In the past we have done left-sided L4-5 lumbar epidural steroid injections under fluoroscopic guidance.  The last time she had 1 of these done was on 06/29/2018.  We will plan on having this repeated as soon as possible.  Pharmacotherapy Assessment  Analgesic: Oxycodone IR 5 mg, 1 tab PO BID (10 mg/day of oxycodone) MME/day: 15 mg/day.   Monitoring: Pharmacotherapy: No side-effects or adverse reactions reported. [place] PMP: PDMP reviewed during this encounter.       Compliance: No problems identified. Effectiveness: Clinically acceptable. Plan: Refer to "POC".  UDS:  Summary  Date Value Ref Range Status  01/10/2019 FINAL  Final    Comment:    ==================================================================== TOXASSURE SELECT 13 (MW) ==================================================================== Test  Result       Flag       Units Drug Present and Declared for Prescription Verification   Noroxycodone                    500          EXPECTED   ng/mg creat    Noroxycodone is an expected metabolite of oxycodone. Sources of    oxycodone include scheduled prescription medications. Drug Absent but Declared for Prescription Verification   Oxycodone                      Not Detected UNEXPECTED ng/mg creat    Oxycodone is almost always present in patients taking this drug    consistently.  Absence of oxycodone could be due to lapse of time    since the last dose or unusual pharmacokinetics (rapid    metabolism). ==================================================================== Test                      Result    Flag   Units      Ref Range   Creatinine              128              mg/dL      >=20 ==================================================================== Declared Medications:  The flagging and interpretation on this report are based on the  following declared medications.  Unexpected results may arise from  inaccuracies in the declared medications.  **Note: The testing scope of this panel includes these medications:  Oxycodone  **Note: The testing scope of this panel does not include following  reported medications:  Acetaminophen (Tylenol)  Bupropion (Wellbutrin)  Dorzolamide  Doxepin  Meclizine  Multivitamin  Pantoprazole (Protonix)  Sertraline (Zoloft)  Zolpidem (Ambien) ==================================================================== For clinical consultation, please call [phone]. ====================================================================    Laboratory Chemistry Profile (12 mo)  Renal: 01/10/2019: BUN 14; BUN/Creatinine Ratio 11; Creatinine, Ser 1.26  Lab Results  Component Value Date   GFRAA 46 (L) 01/10/2019   GFRNONAA 39 (L) 01/10/2019   Hepatic: 01/10/2019: Albumin 4.3 Lab Results  Component Value Date   AST 18 01/10/2019   ALT 11 (L) 06/24/2016   Other: 01/10/2019: 25-Hydroxy, Vitamin D 26; 25-Hydroxy, Vitamin D-2 1.6; 25-Hydroxy, Vitamin  D-3 24; CRP 2; Sed Rate 51; Vitamin B-12 1,471 Note: Above Lab results reviewed.  Imaging  Last 90 days:  No results found.  Assessment  The primary encounter diagnosis was Chronic pain syndrome. 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  Plan of Care  I have discontinued Dhrithi B. Molony's oxyCODONE and oxyCODONE. I have also changed her oxyCODONE. Additionally, I am having her start on oxyCODONE and oxyCODONE. Lastly, I am having her maintain her doxepin, dorzolamide-timolol, buPROPion, zolpidem, pantoprazole, sertraline, meclizine, acetaminophen, and multivitamin.  Pharmacotherapy (Medications Ordered): Meds ordered this encounter  Medications  . oxyCODONE (OXY IR/ROXICODONE) 5 MG immediate release tablet    Sig: Take 1 tablet (5 mg total) by mouth every 12 (twelve) hours as needed for severe pain. Must last 30 days    Dispense:  60 tablet    Refill:  0    Chronic  Pain: STOP Act (Not applicable) Fill 1 day early if closed on refill date. Do not fill until: 07/11/2019. To last until: 08/10/2019. Avoid benzodiazepines within 8 hours of opioids  . oxyCODONE (OXY IR/ROXICODONE) 5 MG immediate release tablet    Sig: Take 1 tablet (5 mg total) by mouth every 12 (twelve) hours as needed for severe pain. Must last 30 days    Dispense:  60 tablet    Refill:  0    Chronic Pain: STOP Act (Not applicable) Fill 1 day early if closed on refill date. Do not fill until: 08/10/2019. To last until: 09/09/2019. Avoid benzodiazepines within 8 hours of opioids  . oxyCODONE (OXY IR/ROXICODONE) 5 MG immediate release tablet    Sig: Take 1 tablet (5 mg total) by mouth every 12 (twelve)  hours as needed for severe pain. Must last 30 days    Dispense:  60 tablet    Refill:  0    Chronic Pain: STOP Act (Not applicable) Fill 1 day early if closed on refill date. Do not fill until:09/09/2019. To last until: 10/09/2019. Avoid benzodiazepines within 8 hours of opioids   Orders:  Orders Placed This Encounter  Procedures  . Lumbar Epidural Injection    Standing Status:   Future    Standing Expiration Date:   08/10/2019    Scheduling Instructions:     Procedure: Interlaminar Lumbar Epidural Steroid injection (LESI)  L4-5     Laterality: Left-sided     Sedation: Patient's choice.     Timeframe: ASAA    Order Specific Question:   Where will this procedure be performed?    Answer:   [facility]   Follow-up plan:   Return in about 3 months (around 10/09/2019) for (VV), E/M, (MM), in addition, Procedure, (w/ Sedation): (L) L4-5 LESI.      Interventional management options:  Considering: Diagnostic left lumbar facet block #2 Possible bilateral lumbar facet RFA Diagnostic right sacroiliac joint block #2  Possible bilateral sacroiliac joint RFA Diagnostic left L4 TFESI  Diagnostic right L4-5 LESI #2  Diagnostic right L5-S1 LESI #2  Diagnostic left L5-S1 LESI #3  Diagnostic bilateral intra-articular hip joint injection Diagnostic bilateral femoral nerve + obturator nerve block Possible bilateral femoral nerve + obturator nerve RFA Diagnosticleft AC joint injection + MNB (L) Biceps muscle #2  Diagnostic left intra-articular shoulder joint injection #2    Palliative PRN treatment(s): Palliative left L4-5LESI #4  PalliativeL5-S1interlaminarLESI Palliative left intra-articular shoulder joint injection Palliative right sacroiliac joint block Palliative trigger point injections      Recent Visits No visits were found meeting these conditions.  Showing recent visits within past 90 days and meeting all other requirements   Today's Visits Date Type  Provider Dept  07/11/19 Office Visit Delano Metz, MD [facility]  Showing today's visits and meeting all other requirements   Future Appointments No visits were found meeting these conditions.  Showing future appointments within next 90 days and meeting all other requirements   I discussed the assessment and treatment plan with the patient. The patient was provided an opportunity to ask questions and all were answered. The patient agreed with the plan and demonstrated an understanding of the instructions.  Patient advised to call back or seek an in-person evaluation if the symptoms or condition worsens.  Total duration of non-face-to-face encounter: 13 minutes.  Note by: Oswaldo Done, MD Date: 07/11/2019; Time: 1:28 PM  Note: This dictation was prepared with Dragon dictation. Any transcriptional errors  that may result from this process are unintentional.  Disclaimer:  * Given the special circumstances of the COVID-19 pandemic, the federal government has announced that the Office for Civil Rights (OCR) will exercise its enforcement discretion and will not impose penalties on physicians using telehealth in the event of noncompliance with regulatory requirements under the [place] (HIPAA) in connection with the good faith provision of telehealth during the STMHD-62 national public health emergency. ([place])

## 2019-07-10 NOTE — Patient Instructions (Addendum)
____________________________________________________________________________________________  Preparing for Procedure with Sedation  Procedure appointments are limited to planned procedures: . No Prescription Refills. . No disability issues will be discussed. . No medication changes will be discussed.  Instructions: . Oral Intake: Do not eat or drink anything for at least 8 hours prior to your procedure. . Transportation: Public transportation is not allowed. Bring an adult driver. The driver must be physically present in our waiting room before any procedure can be started. . Physical Assistance: Bring an adult physically capable of assisting you, in the event you need help. This adult should keep you company at home for at least 6 hours after the procedure. . Blood Pressure Medicine: Take your blood pressure medicine with a sip of water the morning of the procedure. . Blood thinners: Notify our staff if you are taking any blood thinners. Depending on which one you take, there will be specific instructions on how and when to stop it. . Diabetics on insulin: Notify the staff so that you can be scheduled 1st case in the morning. If your diabetes requires high dose insulin, take only  of your normal insulin dose the morning of the procedure and notify the staff that you have done so. . Preventing infections: Shower with an antibacterial soap the morning of your procedure. . Build-up your immune system: Take 1000 mg of Vitamin C with every meal (3 times a day) the day prior to your procedure. . Antibiotics: Inform the staff if you have a condition or reason that requires you to take antibiotics before dental procedures. . Pregnancy: If you are pregnant, call and cancel the procedure. . Sickness: If you have a cold, fever, or any active infections, call and cancel the procedure. . Arrival: You must be in the facility at least 30 minutes prior to your scheduled procedure. . Children: Do not bring  children with you. . Dress appropriately: Bring dark clothing that you would not mind if they get stained. . Valuables: Do not bring any jewelry or valuables.  Reasons to call and reschedule or cancel your procedure: (Following these recommendations will minimize the risk of a serious complication.) . Surgeries: Avoid having procedures within 2 weeks of any surgery. (Avoid for 2 weeks before or after any surgery). . Flu Shots: Avoid having procedures within 2 weeks of a flu shots or . (Avoid for 2 weeks before or after immunizations). . Barium: Avoid having a procedure within 7-10 days after having had a radiological study involving the use of radiological contrast. (Myelograms, Barium swallow or enema study). . Heart attacks: Avoid any elective procedures or surgeries for the initial 6 months after a "Myocardial Infarction" (Heart Attack). . Blood thinners: It is imperative that you stop these medications before procedures. Let us know if you if you take any blood thinner.  . Infection: Avoid procedures during or within two weeks of an infection (including chest colds or gastrointestinal problems). Symptoms associated with infections include: Localized redness, fever, chills, night sweats or profuse sweating, burning sensation when voiding, cough, congestion, stuffiness, runny nose, sore throat, diarrhea, nausea, vomiting, cold or Flu symptoms, recent or current infections. It is specially important if the infection is over the area that we intend to treat. . Heart and lung problems: Symptoms that may suggest an active cardiopulmonary problem include: cough, chest pain, breathing difficulties or shortness of breath, dizziness, ankle swelling, uncontrolled high or unusually low blood pressure, and/or palpitations. If you are experiencing any of these symptoms, cancel your procedure and contact   your primary care physician for an evaluation.  Remember:  Regular Business hours are:  Monday to Thursday  8:00 AM to 4:00 PM  Provider's Schedule: Kyira Volkert, MD:  Procedure days: Tuesday and Thursday 7:30 AM to 4:00 PM  Bilal Lateef, MD:  Procedure days: Monday and Wednesday 7:30 AM to 4:00 PM ____________________________________________________________________________________________    ____________________________________________________________________________________________  Medication Recommendations and Reminders  Applies to: All patients receiving prescriptions (written and/or electronic).  Medication Rules & Regulations: These rules and regulations exist for your safety and that of others. They are not flexible and neither are we. Dismissing or ignoring them will be considered "non-compliance" with medication therapy, resulting in complete and irreversible termination of such therapy. (See document titled "Medication Rules" for more details.) In all conscience, because of safety reasons, we cannot continue providing a therapy where the patient does not follow instructions.  Pharmacy of record:   Definition: This is the pharmacy where your electronic prescriptions will be sent.   We do not endorse any particular pharmacy.  You are not restricted in your choice of pharmacy.  The pharmacy listed in the electronic medical record should be the one where you want electronic prescriptions to be sent.  If you choose to change pharmacy, simply notify our nursing staff of your choice of new pharmacy.  Recommendations:  Keep all of your pain medications in a safe place, under lock and key, even if you live alone.   After you fill your prescription, take 1 week's worth of pills and put them away in a safe place. You should keep a separate, properly labeled bottle for this purpose. The remainder should be kept in the original bottle. Use this as your primary supply, until it runs out. Once it's gone, then you know that you have 1 week's worth of medicine, and it is time to come in  for a prescription refill. If you do this correctly, it is unlikely that you will ever run out of medicine.  To make sure that the above recommendation works, it is very important that you make sure your medication refill appointments are scheduled at least 1 week before you run out of medicine. To do this in an effective manner, make sure that you do not leave the office without scheduling your next medication management appointment. Always ask the nursing staff to show you in your prescription , when your medication will be running out. Then arrange for the receptionist to get you a return appointment, at least 7 days before you run out of medicine. Do not wait until you have 1 or 2 pills left, to come in. This is very poor planning and does not take into consideration that we may need to cancel appointments due to bad weather, sickness, or emergencies affecting our staff.  "Partial Fill": If for any reason your pharmacy does not have enough pills/tablets to completely fill or refill your prescription, do not allow for a "partial fill". You will need a separate prescription to fill the remaining amount, which we will not provide. If the reason for the partial fill is your insurance, you will need to talk to the pharmacist about payment alternatives for the remaining tablets, but again, do not accept a partial fill.  Prescription refills and/or changes in medication(s):   Prescription refills, and/or changes in dose or medication, will be conducted only during scheduled medication management appointments. (Applies to both, written and electronic prescriptions.)  No refills on procedure days. No medication will be changed or   started on procedure days. No changes, adjustments, and/or refills will be conducted on a procedure day. Doing so will interfere with the diagnostic portion of the procedure.  No phone refills. No medications will be "called into the pharmacy".  No Fax refills.  No weekend  refills.  No Holliday refills.  No after hours refills.  Remember:  Business hours are:  Monday to Thursday 8:00 AM to 4:00 PM Provider's Schedule: Estrellita Lasky, MD - Appointments are:  Medication management: Monday and Wednesday 8:00 AM to 4:00 PM Procedure day: Tuesday and Thursday 7:30 AM to 4:00 PM Bilal Lateef, MD - Appointments are:  Medication management: Tuesday and Thursday 8:00 AM to 4:00 PM Procedure day: Monday and Wednesday 7:30 AM to 4:00 PM (Last update: 01/07/2018) ____________________________________________________________________________________________    

## 2019-07-11 ENCOUNTER — Other Ambulatory Visit: Payer: Self-pay

## 2019-07-11 ENCOUNTER — Ambulatory Visit: Payer: PPO | Attending: Pain Medicine | Admitting: Pain Medicine

## 2019-07-11 DIAGNOSIS — M25552 Pain in left hip: Secondary | ICD-10-CM

## 2019-07-11 DIAGNOSIS — M5416 Radiculopathy, lumbar region: Secondary | ICD-10-CM

## 2019-07-11 DIAGNOSIS — M4316 Spondylolisthesis, lumbar region: Secondary | ICD-10-CM | POA: Diagnosis not present

## 2019-07-11 DIAGNOSIS — M25551 Pain in right hip: Secondary | ICD-10-CM

## 2019-07-11 DIAGNOSIS — M5136 Other intervertebral disc degeneration, lumbar region: Secondary | ICD-10-CM

## 2019-07-11 DIAGNOSIS — G894 Chronic pain syndrome: Secondary | ICD-10-CM

## 2019-07-11 DIAGNOSIS — G8929 Other chronic pain: Secondary | ICD-10-CM | POA: Diagnosis not present

## 2019-07-11 DIAGNOSIS — M545 Low back pain: Secondary | ICD-10-CM

## 2019-07-11 DIAGNOSIS — M79605 Pain in left leg: Secondary | ICD-10-CM | POA: Diagnosis not present

## 2019-07-11 DIAGNOSIS — Q7649 Other congenital malformations of spine, not associated with scoliosis: Secondary | ICD-10-CM

## 2019-07-11 MED ORDER — OXYCODONE HCL 5 MG PO TABS: 5.0000 mg | ORAL_TABLET | Freq: Two times a day (BID) | ORAL | 0 refills | Status: DC | PRN

## 2019-07-26 ENCOUNTER — Encounter: Payer: Self-pay | Admitting: Pain Medicine

## 2019-07-26 ENCOUNTER — Ambulatory Visit
Admission: RE | Admit: 2019-07-26 | Discharge: 2019-07-26 | Disposition: A | Discharge status: Home / Self Care | Payer: PPO | Source: Ambulatory Visit | Attending: Pain Medicine | Admitting: Pain Medicine

## 2019-07-26 ENCOUNTER — Other Ambulatory Visit: Payer: Self-pay

## 2019-07-26 ENCOUNTER — Ambulatory Visit (HOSPITAL_BASED_OUTPATIENT_CLINIC_OR_DEPARTMENT_OTHER): Payer: PPO | Admitting: Pain Medicine

## 2019-07-26 VITALS — BP 103/83 | HR 89 | Temp 98.8°F | Resp 21 | Ht 62.0 in | Wt 165.0 lb

## 2019-07-26 DIAGNOSIS — M5136 Other intervertebral disc degeneration, lumbar region: Secondary | ICD-10-CM | POA: Insufficient documentation

## 2019-07-26 DIAGNOSIS — M79605 Pain in left leg: Secondary | ICD-10-CM | POA: Diagnosis not present

## 2019-07-26 DIAGNOSIS — G8929 Other chronic pain: Secondary | ICD-10-CM | POA: Diagnosis not present

## 2019-07-26 DIAGNOSIS — M4316 Spondylolisthesis, lumbar region: Secondary | ICD-10-CM | POA: Diagnosis not present

## 2019-07-26 DIAGNOSIS — M545 Low back pain, unspecified: Secondary | ICD-10-CM

## 2019-07-26 DIAGNOSIS — M48062 Spinal stenosis, lumbar region with neurogenic claudication: Secondary | ICD-10-CM | POA: Diagnosis not present

## 2019-07-26 DIAGNOSIS — M5416 Radiculopathy, lumbar region: Secondary | ICD-10-CM | POA: Insufficient documentation

## 2019-07-26 DIAGNOSIS — M51369 Other intervertebral disc degeneration, lumbar region without mention of lumbar back pain or lower extremity pain: Secondary | ICD-10-CM

## 2019-07-26 MED ORDER — ROPIVACAINE HCL 2 MG/ML IJ SOLN
2.0000 mL | Freq: Once | INTRAMUSCULAR | Status: AC
  Administered 2019-07-26: 2 mL via EPIDURAL
  Filled 2019-07-26: qty 10

## 2019-07-26 MED ORDER — TRIAMCINOLONE ACETONIDE 40 MG/ML IJ SUSP
40.0000 mg | Freq: Once | INTRAMUSCULAR | Status: AC
  Administered 2019-07-26: 40 mg
  Filled 2019-07-26: qty 1

## 2019-07-26 MED ORDER — SODIUM CHLORIDE 0.9% FLUSH
2.0000 mL | Freq: Once | INTRAVENOUS | Status: AC
  Administered 2019-07-26: 2 mL

## 2019-07-26 MED ORDER — IOHEXOL 180 MG/ML  SOLN
10.0000 mL | Freq: Once | INTRAMUSCULAR | Status: AC
  Administered 2019-07-26: 10 mL via EPIDURAL

## 2019-07-26 MED ORDER — LIDOCAINE HCL 2 % IJ SOLN
20.0000 mL | Freq: Once | INTRAMUSCULAR | Status: AC
  Administered 2019-07-26: 400 mg
  Filled 2019-07-26: qty 40

## 2019-07-26 NOTE — Patient Instructions (Addendum)
____________________________________________________________________________________________  Post-Procedure Discharge Instructions  Instructions:  Apply ice:   Purpose: This will minimize any swelling and discomfort after procedure.   When: Day of procedure, as soon as you get home.  How: Fill a plastic sandwich bag with crushed ice. Cover it with a small towel and apply to injection site.  How long: (15 min on, 15 min off) Apply for 15 minutes then remove x 15 minutes.  Repeat sequence on day of procedure, until you go to bed.  Apply heat:   Purpose: To treat any soreness and discomfort from the procedure.  When: Starting the next day after the procedure.  How: Apply heat to procedure site starting the day following the procedure.  How long: May continue to repeat daily, until discomfort goes away.  Food intake: Start with clear liquids (like water) and advance to regular food, as tolerated.   Physical activities: Keep activities to a minimum for the first 8 hours after the procedure. After that, then as tolerated.  Driving: If you have received any sedation, be responsible and do not drive. You are not allowed to drive for 24 hours after having sedation.  Blood thinner: (Applies only to those taking blood thinners) You may restart your blood thinner 6 hours after your procedure.  Insulin: (Applies only to Diabetic patients taking insulin) As soon as you can eat, you may resume your normal dosing schedule.  Infection prevention: Keep procedure site clean and dry. Shower daily and clean area with soap and water.  Post-procedure Pain Diary: Extremely important that this be done correctly and accurately. Recorded information will be used to determine the next step in treatment. For the purpose of accuracy, follow these rules:  Evaluate only the area treated. Do not report or include pain from an untreated area. For the purpose of this evaluation, ignore all other areas of pain,  except for the treated area.  After your procedure, avoid taking a long nap and attempting to complete the pain diary after you wake up. Instead, set your alarm clock to go off every hour, on the hour, for the initial 8 hours after the procedure. Document the duration of the numbing medicine, and the relief you are getting from it.  Do not go to sleep and attempt to complete it later. It will not be accurate. If you received sedation, it is likely that you were given a medication that may cause amnesia. Because of this, completing the diary at a later time may cause the information to be inaccurate. This information is needed to plan your care.  Follow-up appointment: Keep your post-procedure follow-up evaluation appointment after the procedure (usually 2 weeks for most procedures, 6 weeks for radiofrequencies). DO NOT FORGET to bring you pain diary with you.   Expect: (What should I expect to see with my procedure?)  From numbing medicine (AKA: Local Anesthetics): Numbness or decrease in pain. You may also experience some weakness, which if present, could last for the duration of the local anesthetic.  Onset: Full effect within 15 minutes of injected.  Duration: It will depend on the type of local anesthetic used. On the average, 1 to 8 hours.   From steroids (Applies only if steroids were used): Decrease in swelling or inflammation. Once inflammation is improved, relief of the pain will follow.  Onset of benefits: Depends on the amount of swelling present. The more swelling, the longer it will take for the benefits to be seen. In some cases, up to 10 days.    Duration: Steroids will stay in the system x 2 weeks. Duration of benefits will depend on multiple posibilities including persistent irritating factors.  Side-effects: If present, they may typically last 2 weeks (the duration of the steroids).  Frequent: Cramps (if they occur, drink Gatorade and take over-the-counter Magnesium 450-500 mg  once to twice a day); water retention with temporary weight gain; increases in blood sugar; decreased immune system response; increased appetite.  Occasional: Facial flushing (red, warm cheeks); mood swings; menstrual changes.  Uncommon: Long-term decrease or suppression of natural hormones; bone thinning. (These are more common with higher doses or more frequent use. This is why we prefer that our patients avoid having any injection therapies in other practices.)   Very Rare: Severe mood changes; psychosis; aseptic necrosis.  From procedure: Some discomfort is to be expected once the numbing medicine wears off. This should be minimal if ice and heat are applied as instructed.  Call if: (When should I call?)  You experience numbness and weakness that gets worse with time, as opposed to wearing off.  New onset bowel or bladder incontinence. (Applies only to procedures done in the spine)  Emergency Numbers:  Durning business hours (Monday - Thursday, 8:00 AM - 4:00 PM) (Friday, 9:00 AM - 12:00 Noon): [phone]  After hours: [phone]  NOTE: If you are having a problem and are unable connect with, or to talk to a provider, then go to your nearest urgent care or emergency department. If the problem is serious and urgent, please call 911. ____________________________________________________________________________________________   Pain Management Discharge Instructions  General Discharge Instructions :  If you need to reach your doctor call: Monday-Friday 8:00 am - 4:00 pm at [phone] or toll free [phone].  After clinic hours [phone] to have operator reach doctor.  Bring all of your medication bottles to all your appointments in the pain clinic.  To cancel or reschedule your appointment with Pain Management please remember to call 24 hours in advance to avoid a fee.  Refer to the educational materials which you have been given on: General Risks, I had my  Procedure. Discharge Instructions, Post Sedation.  Post Procedure Instructions:  The drugs you were given will stay in your system until tomorrow, so for the next 24 hours you should not drive, make any legal decisions or drink any alcoholic beverages.  You may eat anything you prefer, but it is better to start with liquids then soups and crackers, and gradually work up to solid foods.  Please notify your doctor immediately if you have any unusual bleeding, trouble breathing or pain that is not related to your normal pain.  Depending on the type of procedure that was done, some parts of your body may feel week and/or numb.  This usually clears up by tonight or the next day.  Walk with the use of an assistive device or accompanied by an adult for the 24 hours.  You may use ice on the affected area for the first 24 hours.  Put ice in a Ziploc bag and cover with a towel and place against area 15 minutes on 15 minutes off.  You may switch to heat after 24 hours.Epidural Steroid Injection Patient Information  Description: The epidural space surrounds the nerves as they exit the spinal cord.  In some patients, the nerves can be compressed and inflamed by a bulging disc or a tight spinal canal (spinal stenosis).  By injecting steroids into the epidural space, we can bring irritated nerves   into direct contact with a potentially helpful medication.  These steroids act directly on the irritated nerves and can reduce swelling and inflammation which often leads to decreased pain.  Epidural steroids may be injected anywhere along the spine and from the neck to the low back depending upon the location of your pain.   After numbing the skin with local anesthetic (like Novocaine), a small needle is passed into the epidural space slowly.  You may experience a sensation of pressure while this is being done.  The entire block usually last less than 10 minutes.  Conditions which may be treated by epidural  steroids:   Low back and leg pain  Neck and arm pain  Spinal stenosis  Post-laminectomy syndrome  Herpes zoster (shingles) pain  Pain from compression fractures  Preparation for the injection:  1. Do not eat any solid food or dairy products within 8 hours of your appointment.  2. You may drink clear liquids up to 3 hours before appointment.  Clear liquids include water, black coffee, juice or soda.  No milk or cream please. 3. You may take your regular medication, including pain medications, with a sip of water before your appointment  Diabetics should hold regular insulin (if taken separately) and take 1/2 normal NPH dos the morning of the procedure.  Carry some sugar containing items with you to your appointment. 4. A driver must accompany you and be prepared to drive you home after your procedure.  5. Bring all your current medications with your. 6. An IV may be inserted and sedation may be given at the discretion of the physician.   7. A blood pressure cuff, EKG and other monitors will often be applied during the procedure.  Some patients may need to have extra oxygen administered for a short period. 8. You will be asked to provide medical information, including your allergies, prior to the procedure.  We must know immediately if you are taking blood thinners (like Coumadin/Warfarin)  Or if you are allergic to IV iodine contrast (dye). We must know if you could possible be pregnant.  Possible side-effects:  Bleeding from needle site  Infection (rare, may require surgery)  Nerve injury (rare)  Numbness & tingling (temporary)  Difficulty urinating (rare, temporary)  Spinal headache ( a headache worse with upright posture)  Light -headedness (temporary)  Pain at injection site (several days)  Decreased blood pressure (temporary)  Weakness in arm/leg (temporary)  Pressure sensation in back/neck (temporary)  Call if you experience:  Fever/chills associated with  headache or increased back/neck pain.  Headache worsened by an upright position.  New onset weakness or numbness of an extremity below the injection site  Hives or difficulty breathing (go to the emergency room)  Inflammation or drainage at the infection site  Severe back/neck pain  Any new symptoms which are concerning to you  Please note:  Although the local anesthetic injected can often make your back or neck feel good for several hours after the injection, the pain will likely return.  It takes 3-7 days for steroids to work in the epidural space.  You may not notice any pain relief for at least that one week.  If effective, we will often do a series of three injections spaced 3-6 weeks apart to maximally decrease your pain.  After the initial series, we generally will wait several months before considering a repeat injection of the same type.  If you have any questions, please call [phone] [facility] 

## 2019-07-26 NOTE — Progress Notes (Signed)
Safety precautions to be maintained throughout the outpatient stay will include: orient to surroundings, keep bed in low position, maintain call bell within reach at all times, provide assistance with transfer out of bed and ambulation.  

## 2019-07-26 NOTE — Progress Notes (Signed)
Patient's Name: Cynthia Bennett  MRN: [id number]  Referring Provider: Jerl MinaHedrick, James, MD  DOB: 19-Apr-1933  PCP: Jerl MinaHedrick, James, MD  DOS: 07/26/2019  Note by: Oswaldo DoneFrancisco A Malyia Moro, MD  Service setting: Ambulatory outpatient  Specialty: Interventional Pain Management  Patient type: Established  Location: [facility] (AMB) [facility] Management Facility  Visit type: Interventional Procedure   Primary Reason for Visit: Interventional Pain Management Treatment. CC: Back Pain (left, lower)  Procedure:          Anesthesia, Analgesia, Anxiolysis:  Type: Palliative Inter-Laminar Epidural Steroid Injection  #4  Region: Lumbar Level: L4-5 Level. Laterality: Left-Sided Paramedial  Type: Local Anesthesia Indication(s): Analgesia         Route: Infiltration ([place]/IM) IV Access: Declined Sedation: Declined  Local Anesthetic: Lidocaine 1-2%  Position: Prone with head of the table was raised to facilitate breathing.   Indications: 1. Chronic lumbar radicular pain (Left) (L5 Dermatome)   2. Severe (5-6 mm) L4-5 lumbar spinal stenosis and (8 mm) L3-4 spinal stenosis.   3. DDD (degenerative disc disease), lumbar   4. Grade 1 spondylolisthesis of L4 over L5   5. Chronic low back pain (Primary Area of Pain) (Bilateral) (L>R)   6. Chronic lower extremity pain (Secondary area of Pain) (Left)    Pain Score: Pre-procedure: 8 /10 Post-procedure: 4 /10   Pre-op Assessment:  Cynthia Bennett is a 85 y.o. (year old), female patient, seen today for interventional treatment. She  has a past surgical history that includes Esophagogastroduodenoscopy (N/A, 04/16/2015); Appendectomy; and Abdominal hysterectomy. Cynthia Bennett has a current medication list which includes the following prescription(s): acetaminophen, bupropion, dorzolamide-timolol, doxepin, meclizine, multivitamin, oxycodone, oxycodone, oxycodone, pantoprazole, sertraline, and zolpidem. Her primarily concern today is the Back Pain (left, lower)  Initial Vital Signs:  Pulse/HCG Rate:  89ECG Heart Rate: 84 Temp: 98.8 F (37.1 C) Resp: 18 BP: 139/67 SpO2: 99 %  BMI: Estimated body mass index is 30.18 kg/m as calculated from the following:   Height as of this encounter: 5\' 2"  (1.575 m).   Weight as of this encounter: 165 lb (74.8 kg).  Risk Assessment: Allergies: Reviewed. She is allergic to ibuprofen and nsaids.  Allergy Precautions: None required Coagulopathies: Reviewed. None identified.  Blood-thinner therapy: None at this time Active Infection(s): Reviewed. None identified. Cynthia Bennett is afebrile  Site Confirmation: Cynthia Bennett was asked to confirm the procedure and laterality before marking the site Procedure checklist: Completed Consent: Before the procedure and under the influence of no sedative(s), amnesic(s), or anxiolytics, the patient was informed of the treatment options, risks and possible complications. To fulfill our ethical and legal obligations, as recommended by the American Medical Association's Code of Ethics, I have informed the patient of my clinical impression; the nature and purpose of the treatment or procedure; the risks, benefits, and possible complications of the intervention; the alternatives, including doing nothing; the risk(s) and benefit(s) of the alternative treatment(s) or procedure(s); and the risk(s) and benefit(s) of doing nothing. The patient was provided information about the general risks and possible complications associated with the procedure. These may include, but are not limited to: failure to achieve desired goals, infection, bleeding, organ or nerve damage, allergic reactions, paralysis, and death. In addition, the patient was informed of those risks and complications associated to Spine-related procedures, such as failure to decrease pain; infection (i.e.: Meningitis, epidural or intraspinal abscess); bleeding (i.e.: epidural hematoma, subarachnoid hemorrhage, or any other type of intraspinal or peri-dural bleeding); organ or nerve  damage (i.e.: Any type of peripheral  nerve, nerve root, or spinal cord injury) with subsequent damage to sensory, motor, and/or autonomic systems, resulting in permanent pain, numbness, and/or weakness of one or several areas of the body; allergic reactions; (i.e.: anaphylactic reaction); and/or death. Furthermore, the patient was informed of those risks and complications associated with the medications. These include, but are not limited to: allergic reactions (i.e.: anaphylactic or anaphylactoid reaction(s)); adrenal axis suppression; blood sugar elevation that in diabetics may result in ketoacidosis or comma; water retention that in patients with history of congestive heart failure may result in shortness of breath, pulmonary edema, and decompensation with resultant heart failure; weight gain; swelling or edema; medication-induced neural toxicity; particulate matter embolism and blood vessel occlusion with resultant organ, and/or nervous system infarction; and/or aseptic necrosis of one or more joints. Finally, the patient was informed that Medicine is not an exact science; therefore, there is also the possibility of unforeseen or unpredictable risks and/or possible complications that may result in a catastrophic outcome. The patient indicated having understood very clearly. We have given the patient no guarantees and we have made no promises. Enough time was given to the patient to ask questions, all of which were answered to the patient's satisfaction. Cynthia Bennett has indicated that she wanted to continue with the procedure. Attestation: I, the ordering provider, attest that I have discussed with the patient the benefits, risks, side-effects, alternatives, likelihood of achieving goals, and potential problems during recovery for the procedure that I have provided informed consent. Date  Time: 07/26/2019  1:58 PM  Pre-Procedure Preparation:  Monitoring: As per clinic protocol. Respiration, ETCO2, SpO2, BP,  heart rate and rhythm monitor placed and checked for adequate function Safety Precautions: Patient was assessed for positional comfort and pressure points before starting the procedure. Time-out: I initiated and conducted the "Time-out" before starting the procedure, as per protocol. The patient was asked to participate by confirming the accuracy of the "Time Out" information. Verification of the correct person, site, and procedure were performed and confirmed by me, the nursing staff, and the patient. "Time-out" conducted as per Joint Commission's Universal Protocol (UP.01.01.01). Time: 1429  Description of Procedure:          Target Area: The interlaminar space, initially targeting the lower laminar border of the superior vertebral body. Approach: Paramedial approach. Area Prepped: Entire Posterior Lumbar Region Prepping solution: DuraPrep (Iodine Povacrylex [0.7% available iodine] and Isopropyl Alcohol, 74% w/w) Safety Precautions: Aspiration looking for blood return was conducted prior to all injections. At no point did we inject any substances, as a needle was being advanced. No attempts were made at seeking any paresthesias. Safe injection practices and needle disposal techniques used. Medications properly checked for expiration dates. SDV (single dose vial) medications used. Description of the Procedure: Protocol guidelines were followed. The procedure needle was introduced through the skin, ipsilateral to the reported pain, and advanced to the target area. Bone was contacted and the needle walked caudad, until the lamina was cleared. The epidural space was identified using "loss-of-resistance technique" with 2-3 ml of PF-NaCl (0.9% NSS), in a 5cc LOR glass syringe.  Vitals:   07/26/19 1424 07/26/19 1428 07/26/19 1433 07/26/19 1434  BP: (!) 141/79 130/75 140/74 103/83  Pulse:      Resp: 19 19 (!) 23 (!) 21  Temp:      TempSrc:      SpO2: 99% 98% 99% 98%  Weight:      Height:         Start Time: 1429  hrs. End Time: 1434 hrs.  Materials:  Needle(s) Type: Epidural needle Gauge: 17G Length: 3.5-in Medication(s): Please see orders for medications and dosing details.  Imaging Guidance (Spinal):          Type of Imaging Technique: Fluoroscopy Guidance (Spinal) Indication(s): Assistance in needle guidance and placement for procedures requiring needle placement in or near specific anatomical locations not easily accessible without such assistance. Exposure Time: Please see nurses notes. Contrast: Before injecting any contrast, we confirmed that the patient did not have an allergy to iodine, shellfish, or radiological contrast. Once satisfactory needle placement was completed at the desired level, radiological contrast was injected. Contrast injected under live fluoroscopy. No contrast complications. See chart for type and volume of contrast used. Fluoroscopic Guidance: I was personally present during the use of fluoroscopy. "Tunnel Vision Technique" used to obtain the best possible view of the target area. Parallax error corrected before commencing the procedure. "Direction-depth-direction" technique used to introduce the needle under continuous pulsed fluoroscopy. Once target was reached, antero-posterior, oblique, and lateral fluoroscopic projection used confirm needle placement in all planes. Images permanently stored in EMR. Interpretation: I personally interpreted the imaging intraoperatively. Adequate needle placement confirmed in multiple planes. Appropriate spread of contrast into desired area was observed. No evidence of afferent or efferent intravascular uptake. No intrathecal or subarachnoid spread observed. Permanent images saved into the patient's record.  Antibiotic Prophylaxis:   Anti-infectives (From admission, onward)   None     Indication(s): None identified  Post-operative Assessment:  Post-procedure Vital Signs:  Pulse/HCG Rate: 8982 Temp: 98.8 F  (37.1 C) Resp: (!) 21 BP: 103/83 SpO2: 98 %  EBL: None  Complications: No immediate post-treatment complications observed by team, or reported by patient.  Note: The patient tolerated the entire procedure well. A repeat set of vitals were taken after the procedure and the patient was kept under observation following institutional policy, for this type of procedure. Post-procedural neurological assessment was performed, showing return to baseline, prior to discharge. The patient was provided with post-procedure discharge instructions, including a section on how to identify potential problems. Should any problems arise concerning this procedure, the patient was given instructions to immediately contact us, at any time, without hesitation. In any case, we plan to contact the patient by telephone for a follow-up status report regarding this interventional procedure.  Comments:  No additional relevant information.  Plan of Care  Orders:  Orders Placed This Encounter  Procedures  . Lumbar Epidural Injection    Scheduling Instructions:     Procedure: Interlaminar LESI L4-5     Laterality: Left-sided     Sedation: Patient's choice     Timeframe:  Today    Order Specific Question:   Where will this procedure be performed?    Answer:   [facility]  . DG PAIN [facility] C-ARM 1-60 MIN NO REPORT    Intraoperative interpretation by procedural physician at [facility].    Standing Status:   Standing    Number of Occurrences:   1    Order Specific Question:   Reason for exam:    Answer:   Assistance in needle guidance and placement for procedures requiring needle placement in or near specific anatomical locations not easily accessible without such assistance.  . Provider attestation of informed consent for procedure/surgical case    I, the ordering provider, attest that I have discussed with the patient the benefits, risks, side effects, alternatives, likelihood of achieving goals  and potential problems during  recovery for the procedure that I have provided informed consent.    Standing Status:   Standing    Number of Occurrences:   1  . Informed Consent Details: Transcribe to consent form and obtain patient signature    Standing Status:   Standing    Number of Occurrences:   1    Order Specific Question:   Procedure    Answer:   Lumbar epidural steroid injection under fluoroscopic guidance. (See notes for level and laterality.)    Order Specific Question:   Surgeon    Answer:   Aeisha Minarik A. Laban Emperor, MD    Order Specific Question:   Indication/Reason    Answer:   Low back pain and/or leg pain secondary to lumbar radiculitis/radiculopathy   Chronic Opioid Analgesic:  Oxycodone IR 5 mg, 1 tab PO BID (10 mg/day of oxycodone) MME/day: 15 mg/day.   Medications ordered for procedure: Meds ordered this encounter  Medications  . iohexol (OMNIPAQUE) 180 MG/ML injection 10 mL    Must be Myelogram-compatible. If not available, you may substitute with a water-soluble, non-ionic, hypoallergenic, myelogram-compatible radiological contrast medium.  Marland Kitchen lidocaine (XYLOCAINE) 2 % (with pres) injection 400 mg  . sodium chloride flush (NS) 0.9 % injection 2 mL  . ropivacaine (PF) 2 mg/mL (0.2%) (NAROPIN) injection 2 mL  . triamcinolone acetonide (KENALOG-40) injection 40 mg   Medications administered: We administered iohexol, lidocaine, sodium chloride flush, ropivacaine (PF) 2 mg/mL (0.2%), and triamcinolone acetonide.  See the medical record for exact dosing, route, and time of administration.  Follow-up plan:   Return in about 2 weeks (around 08/09/2019) for (VV), E/M, (PP).       Interventional management options:  Considering: Diagnostic left lumbar facet block #2 Possible bilateral lumbar facet RFA Diagnostic right sacroiliac joint block #2  Possible bilateral sacroiliac joint RFA Diagnostic left L4 TFESI  Diagnostic right L4-5 LESI #2  Diagnostic right L5-S1  LESI #2  Diagnostic left L5-S1 LESI #3  Diagnostic bilateral intra-articular hip joint injection Diagnostic bilateral femoral nerve + obturator nerve block Possible bilateral femoral nerve + obturator nerve RFA Diagnosticleft AC joint injection + MNB (L) Biceps muscle #2  Diagnostic left intra-articular shoulder joint injection #2    Palliative PRN treatment(s): Palliative left L4-5LESI #5  PalliativeL5-S1interlaminarLESI Palliative left intra-articular shoulder joint injection Palliative right sacroiliac joint block Palliative trigger point injections      Recent Visits Date Type Provider Dept  07/11/19 Office Visit Delano Metz, MD [facility]  Showing recent visits within past 90 days and meeting all other requirements   Today's Visits Date Type Provider Dept  07/26/19 Procedure visit Delano Metz, MD [facility]  Showing today's visits and meeting all other requirements   Future Appointments Date Type Provider Dept  08/22/19 Appointment Delano Metz, MD [facility]  10/10/19 Appointment Delano Metz, MD [facility]  Showing future appointments within next 90 days and meeting all other requirements   Disposition: Discharge home  Discharge Date & Time: 07/26/2019; 1445 hrs.   Primary Care Physician: Jerl Mina, MD Location: [facility] Note by: Oswaldo Done, MD Date: 07/26/2019; Time: 3:34 PM  Disclaimer:  Medicine is not an Visual merchandiser. The only guarantee in medicine is that nothing is guaranteed. It is important to note that the decision to proceed with this intervention was based on the information collected from the patient. The Data and conclusions were drawn from the patient's questionnaire, the interview, and the physical  examination. Because the information was provided in large part by the patient, it cannot be guaranteed that it has not been  purposely or unconsciously manipulated. Every effort has been made to obtain as much relevant data as possible for this evaluation. It is important to note that the conclusions that lead to this procedure are derived in large part from the available data. Always take into account that the treatment will also be dependent on availability of resources and existing treatment guidelines, considered by other Pain Management Practitioners as being common knowledge and practice, at the time of the intervention. For Medico-Legal purposes, it is also important to point out that variation in procedural techniques and pharmacological choices are the acceptable norm. The indications, contraindications, technique, and results of the above procedure should only be interpreted and judged by a Board-Certified Interventional Pain Specialist with extensive familiarity and expertise in the same exact procedure and technique.

## 2019-07-27 ENCOUNTER — Telehealth: Payer: Self-pay

## 2019-07-27 NOTE — Telephone Encounter (Signed)
Post procedure phone call. Patient states she is doing good.  

## 2019-08-03 ENCOUNTER — Ambulatory Visit: Payer: Self-pay | Admitting: Urology

## 2019-08-18 ENCOUNTER — Encounter: Payer: Self-pay | Admitting: Pain Medicine

## 2019-08-21 NOTE — Progress Notes (Signed)
Pain Management Virtual Encounter Note - Virtual Visit via Telephone Telehealth (real-time audio visits between healthcare provider and patient).   Patient's Phone No. & Preferred Pharmacy:  [phone] (home); There is no such number on file (mobile).; (Preferred) [phone] [email or web address] .[id number] [address], [place] AT [facility] OF [address] [place] [address] Phone: [phone] Fax: [phone]    Pre-screening note:  Our staff contacted Ms. Harnisch and offered her an "in person", "face-to-face" appointment versus a telephone encounter. She indicated preferring the telephone encounter, at this time.   Reason for Virtual Visit: COVID-19*  Social distancing based on CDC and AMA recommendations.   I contacted [name] on 08/22/2019 via telephone.      I clearly identified myself as Gaspar Cola, MD. I verified that I was speaking with the correct person using two identifiers (Name: URSALA CRESSY, and date of birth: May 07, 1933).  Advanced Informed Consent I sought verbal advanced consent from [name] for virtual visit interactions. I informed Ms. Lafon of possible security and privacy concerns, risks, and limitations associated with providing "not-in-person" medical evaluation and management services. I also informed Ms. Hammock of the availability of "in-person" appointments. Finally, I informed her that there would be a charge for the virtual visit and that she could be  personally, fully or partially, financially responsible for it. Ms. Mccurley expressed understanding and agreed to proceed.   Historic Elements   Ms. NORVA BOWE is a 85 y.o. year old, female patient evaluated today after her last encounter by our practice on 07/27/2019. Ms. Sliker  has a past medical history of Dizzy spells, Fibromyalgia, Glaucoma, Hiatal hernia, Osteoarthritis, Osteoarthritis of spine with radiculopathy, lumbar region (08/14/2015),  Peptic ulcer disease, Reflux, Spinal stenosis, Stroke ([place]), and TIA (transient ischemic attack). She also  has a past surgical history that includes Esophagogastroduodenoscopy (N/A, 04/16/2015); Appendectomy; and Abdominal hysterectomy. Ms. Wildes has a current medication list which includes the following prescription(s): acetaminophen, bupropion, vitamin d3, dorzolamide-timolol, doxepin, [facility] slow release iron, meclizine, multivitamin, oxycodone, oxycodone, oxycodone, pantoprazole, sertraline, and zolpidem. She  reports that she has quit smoking. She has never used smokeless tobacco. She reports that she does not drink alcohol or use drugs. Ms. Blizard is allergic to ibuprofen and nsaids.   HPI  Today, she is being contacted for both, medication management and a post-procedure assessment.  She indicates having done great after they left L4-5 LESI.  However, she says that her right hip was hurting before she had the LESI and he has not gotten any better.  In the past we have done some intra-articular joint injections with good results and she would like to have this done again.  We will go ahead and schedule this as soon as possible.  Post-Procedure Evaluation  Procedure: Palliative left L4-5 LESI #4 under fluoroscopic guidance, no sedation Pre-procedure pain level:  8/10 Post-procedure: 4/10 50% relief  Sedation: None.  Effectiveness during initial hour after procedure(Ultra-Short Term Relief): 100 %   Local anesthetic used: Long-acting (4-6 hours) Effectiveness: Defined as any analgesic benefit obtained secondary to the administration of local anesthetics. This carries significant diagnostic value as to the etiological location, or anatomical origin, of the pain. Duration of benefit is expected to coincide with the duration of the local anesthetic used.  Effectiveness during initial 4-6 hours after procedure(Short-Term Relief): 75 %   Long-term benefit: Defined as any relief past the  pharmacologic  duration of the local anesthetics.  Effectiveness past the initial 6 hours after procedure(Long-Term Relief): 85 %   Current benefits: Defined as benefit that persist at this time.   Analgesia:  >75% relief Function: [name] reports improvement in function ROM: [name] reports improvement in ROM  Pharmacotherapy Assessment  Analgesic: Oxycodone IR 5 mg, 1 tab PO BID (10 mg/day of oxycodone) MME/day: 15 mg/day.   Monitoring: Pharmacotherapy: No side-effects or adverse reactions reported. [place] PMP: PDMP reviewed during this encounter.       Compliance: No problems identified. Effectiveness: Clinically acceptable. Plan: Refer to "POC".  UDS:  Summary  Date Value Ref Range Status  01/10/2019 FINAL  Final    Comment:    ==================================================================== TOXASSURE SELECT 13 (MW) ==================================================================== Test                             Result       Flag       Units Drug Present and Declared for Prescription Verification   Noroxycodone                   500          EXPECTED   ng/mg creat    Noroxycodone is an expected metabolite of oxycodone. Sources of    oxycodone include scheduled prescription medications. Drug Absent but Declared for Prescription Verification   Oxycodone                      Not Detected UNEXPECTED ng/mg creat    Oxycodone is almost always present in patients taking this drug    consistently.  Absence of oxycodone could be due to lapse of time    since the last dose or unusual pharmacokinetics (rapid    metabolism). ==================================================================== Test                      Result    Flag   Units      Ref Range   Creatinine              128              mg/dL      >=40>=20 ==================================================================== Declared Medications:  The flagging and interpretation on this report are based on the  following declared  medications.  Unexpected results may arise from  inaccuracies in the declared medications.  **Note: The testing scope of this panel includes these medications:  Oxycodone  **Note: The testing scope of this panel does not include following  reported medications:  Acetaminophen (Tylenol)  Bupropion (Wellbutrin)  Dorzolamide  Doxepin  Meclizine  Multivitamin  Pantoprazole (Protonix)  Sertraline (Zoloft)  Zolpidem (Ambien) ==================================================================== For clinical consultation, please call [phone]. ====================================================================    Laboratory Chemistry Profile (12 mo)  Renal: 01/10/2019: BUN 14; BUN/Creatinine Ratio 11; Creatinine, Ser 1.26  Lab Results  Component Value Date   GFRAA 46 (L) 01/10/2019   GFRNONAA 39 (L) 01/10/2019   Hepatic: 01/10/2019: Albumin 4.3 Lab Results  Component Value Date   AST 18 01/10/2019   ALT 11 (L) 06/24/2016   Other: 01/10/2019: 25-Hydroxy, Vitamin D 26; 25-Hydroxy, Vitamin D-2 1.6; 25-Hydroxy, Vitamin D-3 24; CRP 2; Sed Rate 51; Vitamin B-12 1,471 Note: Above Lab results reviewed.  Imaging  Last 90 days:  [facility] C-arm 1-60 Min No Report  Result Date: 07/26/2019 Fluoro was used, but no  Radiologist interpretation will be provided. Please refer to "NOTES" tab for provider progress note.   Assessment  The primary encounter diagnosis was Chronic pain syndrome. 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  Plan of Care  I am having Parul B. Gedney start on oxyCODONE. I am also having her maintain her doxepin, dorzolamide-timolol, buPROPion, zolpidem, pantoprazole, sertraline, meclizine, acetaminophen, multivitamin, oxyCODONE,  Vitamin D3, [facility] Slow Release Iron, and oxyCODONE.  Pharmacotherapy (Medications Ordered): Meds ordered this encounter  Medications  . oxyCODONE (OXY IR/ROXICODONE) 5 MG immediate release tablet    Sig: Take 1 tablet (5 mg total) by mouth every 12 (twelve) hours as needed for severe pain. Must last 30 days    Dispense:  60 tablet    Refill:  0    Chronic Pain: STOP Act (Not applicable) Fill 1 day early if closed on refill date. Do not fill until: 10/09/2019. To last until: 11/08/2019. Avoid benzodiazepines within 8 hours of opioids  . oxyCODONE (OXY IR/ROXICODONE) 5 MG immediate release tablet    Sig: Take 1 tablet (5 mg total) by mouth every 12 (twelve) hours as needed for severe pain. Must last 30 days    Dispense:  60 tablet    Refill:  0    Chronic Pain: STOP Act (Not applicable) Fill 1 day early if closed on refill date. Do not fill until: 11/08/2019. To last until: 12/08/2019. Avoid benzodiazepines within 8 hours of opioids   Orders:  Orders Placed This Encounter  Procedures  . Lumbar Epidural Injection    Standing Status:   Standing    Number of Occurrences:   9    Standing Expiration Date:   02/19/2021    Scheduling Instructions:     Purpose: Palliative     Indication: Lower extremity pain/Sciatica left (M54.32).     Side: Left-sided     Level: L4-5     Sedation: Patient's choice.     TIMEFRAME: PRN procedure. (Ms. Sigler will call when needed.)    Order Specific Question:   Where will this procedure be performed?    Answer:   [facility]  . HIP INJECTION    Standing Status:   Future    Standing Expiration Date:   09/21/2019    Scheduling Instructions:     Side: Right-sided     Sedation: No Sedation.     Timeframe: As soon as schedule allows   Follow-up plan:   Return in about 4 months (around 12/07/2019) for Procedure (no sedation): (R) IA Hip inj., (ASAP).     Interventional management options:  Considering: Possible bilateral lumbar facet RFA Possible  bilateral sacroiliac joint RFA Diagnostic left L4 TFESI  Diagnostic bilateral IA hip joint injection Diagnostic bilateral femoral nerve + obturator NB Possible bilateral femoral nerve + obturator nerve RFA   Palliative PRN treatment(s): Palliative left L4-5LESI #5  Diagnostic right L4-5 LESI #2  Diagnostic right L5-S1 LESI #2  Diagnostic left L5-S1 LESI #3  Palliative left IA shoulder joint injection #2  Diagnosticleft AC joint injection + MNB (L) Biceps muscle #2  Palliative right SI joint block #2  Palliative trigger point injections      Recent Visits Date Type Provider Dept  07/26/19 Procedure visit Delano Metz, MD [facility]  07/11/19 Office Visit  Delano Metz, MD [facility]  Showing recent visits within past 90 days and meeting all other requirements   Today's Visits Date Type Provider Dept  08/22/19 Office Visit Delano Metz, MD [facility]  Showing today's visits and meeting all other requirements   Future Appointments Date Type Provider Dept  10/10/19 Appointment Delano Metz, MD [facility]  Showing future appointments within next 90 days and meeting all other requirements   I discussed the assessment and treatment plan with the patient. The patient was provided an opportunity to ask questions and all were answered. The patient agreed with the plan and demonstrated an understanding of the instructions.  Patient advised to call back or seek an in-person evaluation if the symptoms or condition worsens.  Total duration of non-face-to-face encounter: 13 minutes.  Note by: Oswaldo Done, MD Date: 08/22/2019; Time: 2:25 PM  Note: This dictation was prepared with Dragon dictation. Any transcriptional errors that may result from this process are unintentional.  Disclaimer:  * Given the special circumstances of the COVID-19 pandemic, the federal government has announced that the Office for  Civil Rights (OCR) will exercise its enforcement discretion and will not impose penalties on physicians using telehealth in the event of noncompliance with regulatory requirements under the [employer] Portability and Accountability Act (HIPAA) in connection with the good faith provision of telehealth during the COVID-19 national public health emergency. (AMA)

## 2019-08-22 ENCOUNTER — Other Ambulatory Visit: Payer: Self-pay

## 2019-08-22 ENCOUNTER — Ambulatory Visit: Payer: PPO | Attending: Pain Medicine | Admitting: Pain Medicine

## 2019-08-22 DIAGNOSIS — M79605 Pain in left leg: Secondary | ICD-10-CM | POA: Diagnosis not present

## 2019-08-22 DIAGNOSIS — M545 Low back pain: Secondary | ICD-10-CM

## 2019-08-22 DIAGNOSIS — M1611 Unilateral primary osteoarthritis, right hip: Secondary | ICD-10-CM

## 2019-08-22 DIAGNOSIS — M5416 Radiculopathy, lumbar region: Secondary | ICD-10-CM

## 2019-08-22 DIAGNOSIS — G894 Chronic pain syndrome: Secondary | ICD-10-CM

## 2019-08-22 DIAGNOSIS — G8929 Other chronic pain: Secondary | ICD-10-CM

## 2019-08-22 DIAGNOSIS — M25551 Pain in right hip: Secondary | ICD-10-CM | POA: Diagnosis not present

## 2019-08-22 MED ORDER — OXYCODONE HCL 5 MG PO TABS: 5.0000 mg | ORAL_TABLET | Freq: Two times a day (BID) | ORAL | 0 refills | Status: DC | PRN

## 2019-08-22 NOTE — Patient Instructions (Signed)
____________________________________________________________________________________________  Preparing for your procedure (without sedation)  Procedure appointments are limited to planned procedures: . No Prescription Refills. . No disability issues will be discussed. . No medication changes will be discussed.  Instructions: . Oral Intake: Do not eat or drink anything for at least 3 hours prior to your procedure. . Transportation: Unless otherwise stated by your physician, you may drive yourself after the procedure. . Blood Pressure Medicine: Take your blood pressure medicine with a sip of water the morning of the procedure. . Blood thinners: Notify our staff if you are taking any blood thinners. Depending on which one you take, there will be specific instructions on how and when to stop it. . Diabetics on insulin: Notify the staff so that you can be scheduled 1st case in the morning. If your diabetes requires high dose insulin, take only  of your normal insulin dose the morning of the procedure and notify the staff that you have done so. . Preventing infections: Shower with an antibacterial soap the morning of your procedure.  . Build-up your immune system: Take 1000 mg of Vitamin C with every meal (3 times a day) the day prior to your procedure. . Antibiotics: Inform the staff if you have a condition or reason that requires you to take antibiotics before dental procedures. . Pregnancy: If you are pregnant, call and cancel the procedure. . Sickness: If you have a cold, fever, or any active infections, call and cancel the procedure. . Arrival: You must be in the facility at least 30 minutes prior to your scheduled procedure. . Children: Do not bring any children with you. . Dress appropriately: Bring dark clothing that you would not mind if they get stained. . Valuables: Do not bring any jewelry or valuables.  Reasons to call and reschedule or cancel your procedure: (Following these  recommendations will minimize the risk of a serious complication.) . Surgeries: Avoid having procedures within 2 weeks of any surgery. (Avoid for 2 weeks before or after any surgery). . Flu Shots: Avoid having procedures within 2 weeks of a flu shots or . (Avoid for 2 weeks before or after immunizations). . Barium: Avoid having a procedure within 7-10 days after having had a radiological study involving the use of radiological contrast. (Myelograms, Barium swallow or enema study). . Heart attacks: Avoid any elective procedures or surgeries for the initial 6 months after a "Myocardial Infarction" (Heart Attack). . Blood thinners: It is imperative that you stop these medications before procedures. Let us know if you if you take any blood thinner.  . Infection: Avoid procedures during or within two weeks of an infection (including chest colds or gastrointestinal problems). Symptoms associated with infections include: Localized redness, fever, chills, night sweats or profuse sweating, burning sensation when voiding, cough, congestion, stuffiness, runny nose, sore throat, diarrhea, nausea, vomiting, cold or Flu symptoms, recent or current infections. It is specially important if the infection is over the area that we intend to treat. . Heart and lung problems: Symptoms that may suggest an active cardiopulmonary problem include: cough, chest pain, breathing difficulties or shortness of breath, dizziness, ankle swelling, uncontrolled high or unusually low blood pressure, and/or palpitations. If you are experiencing any of these symptoms, cancel your procedure and contact your primary care physician for an evaluation.  Remember:  Regular Business hours are:  Monday to Thursday 8:00 AM to 4:00 PM  Provider's Schedule: Hadi Dubin, MD:  Procedure days: Tuesday and Thursday 7:30 AM to 4:00 PM  Bilal   Lateef, MD:  Procedure days: Monday and Wednesday 7:30 AM to 4:00  PM ____________________________________________________________________________________________    

## 2019-08-24 NOTE — Patient Instructions (Signed)

## 2019-08-24 NOTE — Progress Notes (Signed)
Patient's Name: Cynthia Bennett  MRN: [id number]  Referring Provider: Jerl Mina, MD  DOB: Nov 06, 1933  PCP: Jerl Mina, MD  DOS: 08/25/2019  Note by: Oswaldo Done, MD  Service setting: Ambulatory outpatient  Specialty: Interventional Pain Management  Patient type: Established  Location: [facility] (AMB) Pain Management Facility  Visit type: Interventional Procedure   Primary Reason for Visit: Interventional Pain Management Treatment. CC: Back Pain (right lower)  Procedure:          Anesthesia, Analgesia, Anxiolysis:  Type: Diagnostic Sacroiliac Joint Steroid Injection #2  Region: Superior Lumbosacral Region Level: PSIS (Posterior Superior Iliac Spine) Laterality: Right-Side  Type: Local Anesthesia Indication(s): Analgesia         Route: Infiltration ([place]/IM) IV Access: Declined Sedation: Declined  Local Anesthetic: Lidocaine 1-2%  Position: Prone           Indications: 1. Chronic sacroiliac joint pain (Right)   2. Other specified dorsopathies, sacral and sacrococcygeal region   3. Chronic sacroiliac joint pain (Bilateral) (R>L)   4. Chronic hip pain (Right)    Pain Score: Pre-procedure: 7 /10 Post-procedure: 0-No pain/10   Pre-op Assessment:  Cynthia Bennett is a 85 y.o. (year old), female patient, seen today for interventional treatment. She  has a past surgical history that includes Esophagogastroduodenoscopy (N/A, 04/16/2015); Appendectomy; and Abdominal hysterectomy. Cynthia Bennett has a current medication list which includes the following prescription(s): acetaminophen, bupropion, vitamin d3, dorzolamide-timolol, doxepin, [facility] slow release iron, meclizine, multivitamin, oxycodone, oxycodone, oxycodone, pantoprazole, sertraline, and zolpidem. Her primarily concern today is the Back Pain (right lower)  Initial Vital Signs:  Pulse/HCG Rate: 86ECG Heart Rate: 73 Temp: 98 F (36.7 C) Resp: 18 BP: 93/63 SpO2: 98 %  BMI: Estimated body mass index is 29.26 kg/m as calculated from the  following:   Height as of this encounter:  (1.575 m).   Weight as of this encounter: 160 lb (72.6 kg).  Risk Assessment: Allergies: Reviewed. She is allergic to ibuprofen and nsaids.  Allergy Precautions: None required Coagulopathies: Reviewed. None identified.  Blood-thinner therapy: None at this time Active Infection(s): Reviewed. None identified. Cynthia Bennett is afebrile  Site Confirmation: Cynthia Bennett was asked to confirm the procedure and laterality before marking the site Procedure checklist: Completed Consent: Before the procedure and under the influence of no sedative(s), amnesic(s), or anxiolytics, the patient was informed of the treatment options, risks and possible complications. To fulfill our ethical and legal obligations, as recommended by the American Medical Association's Code of Ethics, I have informed the patient of my clinical impression; the nature and purpose of the treatment or procedure; the risks, benefits, and possible complications of the intervention; the alternatives, including doing nothing; the risk(s) and benefit(s) of the alternative treatment(s) or procedure(s); and the risk(s) and benefit(s) of doing nothing. The patient was provided information about the general risks and possible complications associated with the procedure. These may include, but are not limited to: failure to achieve desired goals, infection, bleeding, organ or nerve damage, allergic reactions, paralysis, and death. In addition, the patient was informed of those risks and complications associated to the procedure, such as failure to decrease pain; infection; bleeding; organ or nerve damage with subsequent damage to sensory, motor, and/or autonomic systems, resulting in permanent pain, numbness, and/or weakness of one or several areas of the body; allergic reactions; (i.e.: anaphylactic reaction); and/or death. Furthermore, the patient was informed of those risks and complications associated with the  medications. These include, but are not limited to: allergic  reactions (i.e.: anaphylactic or anaphylactoid reaction(s)); adrenal axis suppression; blood sugar elevation that in diabetics may result in ketoacidosis or comma; water retention that in patients with history of congestive heart failure may result in shortness of breath, pulmonary edema, and decompensation with resultant heart failure; weight gain; swelling or edema; medication-induced neural toxicity; particulate matter embolism and blood vessel occlusion with resultant organ, and/or nervous system infarction; and/or aseptic necrosis of one or more joints. Finally, the patient was informed that Medicine is not an exact science; therefore, there is also the possibility of unforeseen or unpredictable risks and/or possible complications that may result in a catastrophic outcome. The patient indicated having understood very clearly. We have given the patient no guarantees and we have made no promises. Enough time was given to the patient to ask questions, all of which were answered to the patient's satisfaction. Cynthia Bennett has indicated that she wanted to continue with the procedure. Attestation: I, the ordering provider, attest that I have discussed with the patient the benefits, risks, side-effects, alternatives, likelihood of achieving goals, and potential problems during recovery for the procedure that I have provided informed consent. Date  Time: 08/25/2019 11:16 AM  Pre-Procedure Preparation:  Monitoring: As per clinic protocol. Respiration, ETCO2, SpO2, BP, heart rate and rhythm monitor placed and checked for adequate function Safety Precautions: Patient was assessed for positional comfort and pressure points before starting the procedure. Time-out: I initiated and conducted the "Time-out" before starting the procedure, as per protocol. The patient was asked to participate by confirming the accuracy of the "Time Out" information. Verification  of the correct person, site, and procedure were performed and confirmed by me, the nursing staff, and the patient. "Time-out" conducted as per Joint Commission's Universal Protocol (UP.01.01.01). Time: 1230  Description of Procedure:          Target Area: Superior, posterior, aspect of the sacroiliac fissure Approach: Posterior, paraspinal, ipsilateral approach. Area Prepped: Entire Lower Lumbosacral Region Prepping solution: DuraPrep (Iodine Povacrylex [0.7% available iodine] and Isopropyl Alcohol, 74% w/w) Safety Precautions: Aspiration looking for blood return was conducted prior to all injections. At no point did we inject any substances, as a needle was being advanced. No attempts were made at seeking any paresthesias. Safe injection practices and needle disposal techniques used. Medications properly checked for expiration dates. SDV (single dose vial) medications used. Description of the Procedure: Protocol guidelines were followed. The patient was placed in position over the procedure table. The target area was identified and the area prepped in the usual manner. Skin & deeper tissues infiltrated with local anesthetic. Appropriate amount of time allowed to pass for local anesthetics to take effect. The procedure needle was advanced under fluoroscopic guidance into the sacroiliac joint until a firm endpoint was obtained. Proper needle placement secured. Negative aspiration confirmed. Solution injected in intermittent fashion, asking for systemic symptoms every 0.5cc of injectate. The needles were then removed and the area cleansed, making sure to leave some of the prepping solution back to take advantage of its long term bactericidal properties. Vitals:   08/25/19 1115 08/25/19 1116 08/25/19 1231 08/25/19 1234  BP:  93/63 127/76 102/71  Pulse:  86    Resp:  18 18 17   Temp:  98 F (36.7 C)    SpO2:  98% 97% 98%  Weight: 160 lb (72.6 kg)     Height: 5\' 2"  (1.575 m)       Start Time: 1231  hrs. End Time: 1234 hrs. Materials:  Needle(s) Type: Spinal  Needle Gauge: 22G Length: 3.5-in Medication(s): Please see orders for medications and dosing details.  Imaging Guidance (Non-Spinal):          Type of Imaging Technique: Fluoroscopy Guidance (Non-Spinal) Indication(s): Assistance in needle guidance and placement for procedures requiring needle placement in or near specific anatomical locations not easily accessible without such assistance. Exposure Time: Please see nurses notes. Contrast: Before injecting any contrast, we confirmed that the patient did not have an allergy to iodine, shellfish, or radiological contrast. Once satisfactory needle placement was completed at the desired level, radiological contrast was injected. Contrast injected under live fluoroscopy. No contrast complications. See chart for type and volume of contrast used. Fluoroscopic Guidance: I was personally present during the use of fluoroscopy. "Tunnel Vision Technique" used to obtain the best possible view of the target area. Parallax error corrected before commencing the procedure. "Direction-depth-direction" technique used to introduce the needle under continuous pulsed fluoroscopy. Once target was reached, antero-posterior, oblique, and lateral fluoroscopic projection used confirm needle placement in all planes. Images permanently stored in EMR. Interpretation: I personally interpreted the imaging intraoperatively. Adequate needle placement confirmed in multiple planes. Appropriate spread of contrast into desired area was observed. No evidence of afferent or efferent intravascular uptake. Permanent images saved into the patient's record.  Antibiotic Prophylaxis:   Anti-infectives (From admission, onward)   None     Indication(s): None identified  Post-operative Assessment:  Post-procedure Vital Signs:  Pulse/HCG Rate: 8678 Temp: 98 F (36.7 C) Resp: 17 BP: 102/71 SpO2: 98 %  EBL:  None  Complications: No immediate post-treatment complications observed by team, or reported by patient.  Note: The patient tolerated the entire procedure well. A repeat set of vitals were taken after the procedure and the patient was kept under observation following institutional policy, for this type of procedure. Post-procedural neurological assessment was performed, showing return to baseline, prior to discharge. The patient was provided with post-procedure discharge instructions, including a section on how to identify potential problems. Should any problems arise concerning this procedure, the patient was given instructions to immediately contact us, at any time, without hesitation. In any case, we plan to contact the patient by telephone for a follow-up status report regarding this interventional procedure.  Comments:  No additional relevant information.  Plan of Care  Orders:  Orders Placed This Encounter  Procedures  . DG PAIN [facility] C-ARM 1-60 MIN NO REPORT    Intraoperative interpretation by procedural physician at [place].    Standing Status:   Standing    Number of Occurrences:   1    Order Specific Question:   Reason for exam:    Answer:   Assistance in needle guidance and placement for procedures requiring needle placement in or near specific anatomical locations not easily accessible without such assistance.  . Provide equipment / supplies at bedside    Equipment required: Single use, disposable, "Block Tray"    Standing Status:   Standing    Number of Occurrences:   1    Order Specific Question:   Specify    Answer:   Block Tray   Chronic Opioid Analgesic:  Oxycodone IR 5 mg, 1 tab PO BID (10 mg/day of oxycodone) MME/day: 15 mg/day.   Medications ordered for procedure: Meds ordered this encounter  Medications  . DISCONTD: iohexol (OMNIPAQUE) 180 MG/ML injection 10 mL    Must be Myelogram-compatible. If not available, you may substitute with a  water-soluble, non-ionic, hypoallergenic, myelogram-compatible radiological contrast medium.  Marland Kitchen  lidocaine (XYLOCAINE) 2 % (with pres) injection 400 mg  . methylPREDNISolone acetate (DEPO-MEDROL) injection 80 mg  . ropivacaine (PF) 2 mg/mL (0.2%) (NAROPIN) injection 9 mL   Medications administered: We administered lidocaine, methylPREDNISolone acetate, and ropivacaine (PF) 2 mg/mL (0.2%).  See the medical record for exact dosing, route, and time of administration.  Follow-up plan:   Return in about 2 weeks (around 09/08/2019) for (VV), (PP).       Interventional management options:  Considering: Possible bilateral lumbar facet RFA Possible bilateral sacroiliac joint RFA Diagnostic left L4 TFESI  Diagnostic bilateral IA hip joint injection Diagnostic bilateral femoral nerve + obturator NB Possible bilateral femoral nerve + obturator nerve RFA   Palliative PRN treatment(s): Palliative left L4-5LESI #5  Diagnostic right L4-5 LESI #2  Diagnostic right L5-S1 LESI #2  Diagnostic left L5-S1 LESI #3  Palliative left IA shoulder joint injection #2  Diagnosticleft AC joint injection + MNB (L) Biceps muscle #2  Palliative right SI joint block #3  Palliative trigger point injections       Recent Visits Date Type Provider Dept  08/22/19 Office Visit Delano MetzNaveira, Natividad Schlosser, MD [facility]  07/26/19 Procedure visit Delano MetzNaveira, Alysse Rathe, MD [facility]  07/11/19 Office Visit Delano MetzNaveira, Wanya Bangura, MD [facility]  Showing recent visits within past 90 days and meeting all other requirements   Today's Visits Date Type Provider Dept  08/25/19 Procedure visit Delano MetzNaveira, Denson Niccoli, MD [facility]  Showing today's visits and meeting all other requirements   Future Appointments Date Type Provider Dept  09/12/19 Appointment Delano MetzNaveira, Detria Cummings, MD [facility]  11/21/19 Appointment Delano MetzNaveira, Zahra Peffley, MD [facility]  Showing future  appointments within next 90 days and meeting all other requirements   Disposition: Discharge home  Discharge Date & Time: 08/25/2019; 1245 hrs.   Primary Care Physician: Jerl MinaHedrick, James, MD [facility] Note by: Oswaldo DoneFrancisco A Denys Salinger, MD Date: 08/25/2019; Time: 12:46 PM  Disclaimer:  Medicine is not an Visual merchandiserexact science. The only guarantee in medicine is that nothing is guaranteed. It is important to note that the decision to proceed with this intervention was based on the information collected from the patient. The Data and conclusions were drawn from the patient's questionnaire, the interview, and the physical examination. Because the information was provided in large part by the patient, it cannot be guaranteed that it has not been purposely or unconsciously manipulated. Every effort has been made to obtain as much relevant data as possible for this evaluation. It is important to note that the conclusions that lead to this procedure are derived in large part from the available data. Always take into account that the treatment will also be dependent on availability of resources and existing treatment guidelines, considered by other Pain Management Practitioners as being common knowledge and practice, at the time of the intervention. For Medico-Legal purposes, it is also important to point out that variation in procedural techniques and pharmacological choices are the acceptable norm. The indications, contraindications, technique, and results of the above procedure should only be interpreted and judged by a Board-Certified Interventional Pain Specialist with extensive familiarity and expertise in the same exact procedure and technique.

## 2019-08-25 ENCOUNTER — Encounter: Payer: Self-pay | Admitting: Pain Medicine

## 2019-08-25 ENCOUNTER — Other Ambulatory Visit: Payer: Self-pay

## 2019-08-25 ENCOUNTER — Ambulatory Visit (HOSPITAL_BASED_OUTPATIENT_CLINIC_OR_DEPARTMENT_OTHER): Payer: PPO | Admitting: Pain Medicine

## 2019-08-25 ENCOUNTER — Ambulatory Visit
Admission: RE | Admit: 2019-08-25 | Discharge: 2019-08-25 | Disposition: A | Discharge status: Home / Self Care | Payer: PPO | Source: Ambulatory Visit | Attending: Pain Medicine | Admitting: Pain Medicine

## 2019-08-25 VITALS — BP 102/71 | HR 86 | Temp 98.0°F | Resp 17 | Ht 62.0 in | Wt 160.0 lb

## 2019-08-25 DIAGNOSIS — M25551 Pain in right hip: Secondary | ICD-10-CM

## 2019-08-25 DIAGNOSIS — G8929 Other chronic pain: Secondary | ICD-10-CM | POA: Insufficient documentation

## 2019-08-25 DIAGNOSIS — M533 Sacrococcygeal disorders, not elsewhere classified: Secondary | ICD-10-CM | POA: Insufficient documentation

## 2019-08-25 DIAGNOSIS — M5388 Other specified dorsopathies, sacral and sacrococcygeal region: Secondary | ICD-10-CM | POA: Diagnosis not present

## 2019-08-25 MED ORDER — LIDOCAINE HCL 2 % IJ SOLN
20.0000 mL | Freq: Once | INTRAMUSCULAR | Status: AC
  Administered 2019-08-25: 400 mg
  Filled 2019-08-25: qty 20

## 2019-08-25 MED ORDER — METHYLPREDNISOLONE ACETATE 80 MG/ML IJ SUSP
80.0000 mg | Freq: Once | INTRAMUSCULAR | Status: AC
  Administered 2019-08-25: 12:00:00 80 mg via INTRA_ARTICULAR
  Filled 2019-08-25: qty 1

## 2019-08-25 MED ORDER — ROPIVACAINE HCL 2 MG/ML IJ SOLN
9.0000 mL | Freq: Once | INTRAMUSCULAR | Status: AC
  Administered 2019-08-25: 12:00:00 10 mL via INTRA_ARTICULAR
  Filled 2019-08-25: qty 10

## 2019-08-25 MED ORDER — IOHEXOL 180 MG/ML  SOLN: 10.0000 mL | Freq: Once | INTRAMUSCULAR | Status: DC

## 2019-08-25 NOTE — Progress Notes (Signed)
Safety precautions to be maintained throughout the outpatient stay will include: orient to surroundings, keep bed in low position, maintain call bell within reach at all times, provide assistance with transfer out of bed and ambulation.  

## 2019-08-26 ENCOUNTER — Telehealth: Payer: Self-pay | Admitting: *Deleted

## 2019-08-26 NOTE — Telephone Encounter (Signed)
Spoke with patient re; procedure yesterday.  States she is doing very well no c/o.

## 2019-09-08 ENCOUNTER — Encounter: Payer: Self-pay | Admitting: Pain Medicine

## 2019-09-08 NOTE — Progress Notes (Signed)
Questions asked to patient:  1. About 15 minutes after the procedure, while the area was still numb from the local anesthetics, were you having any pain in the area? 2. How many days was the area numb? 3. How much better is your pain now, when compared to before the procedure? 4. Did you have any problems with the procedure? (Side-effects/Complications) 5. What got better? 6. What area did not get better? Pain relief after procedure (treated area only):  First 1 hour: 100% (No pain)   Initial 4-6 hours: 100% (No pain)   Any benefit longer than 6 hours: 40%   Current benefit: 30%   Improvement in ROM (Range of Motion): Yes   Improvement in function: Yes

## 2019-09-12 ENCOUNTER — Other Ambulatory Visit: Payer: Self-pay

## 2019-09-12 ENCOUNTER — Ambulatory Visit: Payer: PPO | Attending: Pain Medicine | Admitting: Pain Medicine

## 2019-09-12 DIAGNOSIS — G894 Chronic pain syndrome: Secondary | ICD-10-CM

## 2019-09-12 DIAGNOSIS — M533 Sacrococcygeal disorders, not elsewhere classified: Secondary | ICD-10-CM

## 2019-09-12 DIAGNOSIS — G8929 Other chronic pain: Secondary | ICD-10-CM | POA: Diagnosis not present

## 2019-09-12 DIAGNOSIS — M5388 Other specified dorsopathies, sacral and sacrococcygeal region: Secondary | ICD-10-CM

## 2019-09-12 DIAGNOSIS — M25551 Pain in right hip: Secondary | ICD-10-CM

## 2019-09-12 NOTE — Progress Notes (Signed)
Pain Management Virtual Encounter Note - Virtual Visit via Telephone Telehealth (real-time audio visits between healthcare provider and patient).   Patient's Phone No. & Preferred Pharmacy:  [phone] (home); There is no such number on file (mobile).; (Preferred) [phone] [email or web address]  [id number] [name]- [place], [place] - [address] AT [facility] [address] [place] [id number] Phone: [phone] Fax: [phone]    Pre-screening note:  Our staff contacted Ms. Barz and offered her an "in person", "face-to-face" appointment versus a telephone encounter. She indicated preferring the telephone encounter, at this time.   Reason for Virtual Visit: COVID-19*  Social distancing based on CDC and AMA recommendations.   I contacted Ruta HindsFaye B Miao on 09/12/2019 via telephone.      I clearly identified myself as Oswaldo DoneFrancisco A Jewelene Mairena, MD. I verified that I was speaking with the correct person using two identifiers (Name: Ruta HindsFaye B Noah, and date of birth: 1933-01-11).  Advanced Informed Consent I sought verbal advanced consent from Ruta HindsFaye B Modeste for virtual visit interactions. I informed Ms. Schroeter of possible security and privacy concerns, risks, and limitations associated with providing "not-in-person" medical evaluation and management services. I also informed Ms. Bencivenga of the availability of "in-person" appointments. Finally, I informed her that there would be a charge for the virtual visit and that she could be  personally, fully or partially, financially responsible for it. [name] expressed understanding and agreed to proceed.   Historic Elements   Ms. Ruta HindsFaye B Shank is a 85 y.o. year old, female patient evaluated today after her last encounter by our practice on 08/26/2019. Ms. Brew  has a past medical history of Dizzy spells, Fibromyalgia, Glaucoma, Hiatal hernia, Osteoarthritis, Osteoarthritis of spine with radiculopathy, lumbar region (08/14/2015),  Peptic ulcer disease, Reflux, Spinal stenosis, Stroke (HCC), and TIA (transient ischemic attack). She also  has a past surgical history that includes Esophagogastroduodenoscopy (N/A, 04/16/2015); Appendectomy; and Abdominal hysterectomy. [name] has a current medication list which includes the following prescription(s): acetaminophen, bupropion, vitamin d3, dorzolamide-timolol, doxepin, [facility]vs slow release iron, meclizine, multivitamin, oxycodone, oxycodone, oxycodone, pantoprazole, sertraline, and zolpidem. She  reports that she has quit smoking. She has never used smokeless tobacco. She reports that she does not drink alcohol or use drugs. [name] is allergic to ibuprofen and nsaids.   HPI  Today, she is being contacted for a post-procedure assessment.  The patient refers feeling much better after the injection and not having much problems.  The only time that she describes having problems is when she stands up for prolonged period time.  When she does this, then she starts having some back pain and some leg pain.  An MRI done on 06/25/2016 indicates that she has spinal stenosis at a couple levels that could be responsible for this.  The other possibility would be if she had gained some weight, but she indicates that it has been the opposite.  Therefore, it is more likely from the clinical standpoint that it is this spinal stenosis that is getting worse.  Today I have offered to order a repeat MRI, but she indicates that she is doing okay for the time being and that she can tolerate it.  I have instructed her to let me know as soon as this gets to the point where it is bothering her a lot again because at this point we will need to repeat that MRI.  We also need to keep in mind  that some of her back pain, and groin pain may be coming from her stenosis and therefore she may benefit from a left-sided L2-3 and/or right-sided L3-4 transforaminal ESI versus interlaminar.  Post-Procedure Evaluation  Procedure:  Diagnostic/therapeutic right-sided sacroiliac joint block #2 under fluoroscopic guidance, no sedation Pre-procedure pain level:  7/10 Post-procedure: 0/10 (100% relief)  Sedation: None.  Effectiveness during initial hour after procedure(Ultra-Short Term Relief):   100%  Local anesthetic used: Long-acting (4-6 hours) Effectiveness: Defined as any analgesic benefit obtained secondary to the administration of local anesthetics. This carries significant diagnostic value as to the etiological location, or anatomical origin, of the pain. Duration of benefit is expected to coincide with the duration of the local anesthetic used.  Effectiveness during initial 4-6 hours after procedure(Short-Term Relief):   100%  Long-term benefit: Defined as any relief past the pharmacologic duration of the local anesthetics.  Effectiveness past the initial 6 hours after procedure(Long-Term Relief):   40%  Current benefits: Defined as benefit that persist at this time.   Analgesia:  30% Function: Somewhat improved ROM: Somewhat improved  Pharmacotherapy Assessment  Analgesic: Oxycodone IR 5 mg, 1 tab PO BID (10 mg/day of oxycodone) MME/day: 15 mg/day.   Monitoring: Pharmacotherapy: No side-effects or adverse reactions reported. [place] PMP: PDMP reviewed during this encounter.       Compliance: No problems identified. Effectiveness: Clinically acceptable. Plan: Refer to "POC".  UDS:  Summary  Date Value Ref Range Status  01/10/2019 FINAL  Final    Comment:    ==================================================================== TOXASSURE SELECT 13 (MW) ==================================================================== Test                             Result       Flag       Units Drug Present and Declared for Prescription Verification   Noroxycodone                   500          EXPECTED   ng/mg creat    Noroxycodone is an expected metabolite of oxycodone. Sources of    oxycodone include scheduled  prescription medications. Drug Absent but Declared for Prescription Verification   Oxycodone                      Not Detected UNEXPECTED ng/mg creat    Oxycodone is almost always present in patients taking this drug    consistently.  Absence of oxycodone could be due to lapse of time    since the last dose or unusual pharmacokinetics (rapid    metabolism). ==================================================================== Test                      Result    Flag   Units      Ref Range   Creatinine              128              mg/dL      >=10 ==================================================================== Declared Medications:  The flagging and interpretation on this report are based on the  following declared medications.  Unexpected results may arise from  inaccuracies in the declared medications.  **Note: The testing scope of this panel includes these medications:  Oxycodone  **Note: The testing scope of this panel does not include following  reported medications:  Acetaminophen (Tylenol)  Bupropion (Wellbutrin)  Dorzolamide  Doxepin  Meclizine  Multivitamin  Pantoprazole (Protonix)  Sertraline (Zoloft)  Zolpidem (Ambien) ==================================================================== For clinical consultation, please call [phone]. ====================================================================    Laboratory Chemistry Profile (12 mo)  Renal: 01/10/2019: BUN 14; BUN/Creatinine Ratio 11; Creatinine, Ser 1.26  Lab Results  Component Value Date   GFRAA 46 (L) 01/10/2019   GFRNONAA 39 (L) 01/10/2019   Hepatic: 01/10/2019: Albumin 4.3 Lab Results  Component Value Date   AST 18 01/10/2019   ALT 11 (L) 06/24/2016   Other: 01/10/2019: 25-Hydroxy, Vitamin D 26; 25-Hydroxy, Vitamin D-2 1.6; 25-Hydroxy, Vitamin D-3 24; CRP 2; Sed Rate 51; Vitamin B-12 1,471 Note: Above Lab results reviewed.  Imaging  Last 90 days:  Dg Pain Clinic C-arm 1-60 Min No  Report  Result Date: 08/25/2019 Fluoro was used, but no Radiologist interpretation will be provided. Please refer to "NOTES" tab for provider progress note.  Dg Pain Clinic C-arm 1-60 Min No Report  Result Date: 07/26/2019 Fluoro was used, but no Radiologist interpretation will be provided. Please refer to "NOTES" tab for provider progress note.   Assessment  The primary encounter diagnosis was Chronic sacroiliac joint pain (Right). Diagnoses of Other specified dorsopathies, sacral and sacrococcygeal region, Chronic sacroiliac joint pain (Bilateral) (R>L), Chronic hip pain (Right), and Chronic pain syndrome were also pertinent to this visit.  Plan of Care  I am having Jameelah B. Balthaser maintain her doxepin, dorzolamide-timolol, buPROPion, zolpidem, pantoprazole, sertraline, meclizine, acetaminophen, multivitamin, oxyCODONE, Vitamin D3, [facility] Slow Release Iron, oxyCODONE, and oxyCODONE.  Pharmacotherapy (Medications Ordered): No orders of the defined types were placed in this encounter.  Orders:  No orders of the defined types were placed in this encounter.  Follow-up plan:   No follow-ups on file.      Interventional management options:  Considering: Possible bilateral lumbar facet RFA Possible bilateral sacroiliac joint RFA Diagnostic left L4 TFESI  Diagnostic bilateral IA hip joint injection Diagnostic bilateral femoral nerve + obturator NB Possible bilateral femoral nerve + obturator nerve RFA   Palliative PRN treatment(s): Palliative left L4-5LESI #5  Diagnostic right L4-5 LESI #2  Diagnostic right L5-S1 LESI #2  Diagnostic left L5-S1 LESI #3  Palliative left IA shoulder joint injection #2  Diagnosticleft AC joint injection + MNB (L) Biceps muscle #2  Palliative right SI joint block #3  Palliative trigger point injections        Recent Visits Date Type Provider Dept  08/25/19 Procedure visit Milinda Pointer, MD [facility]  08/22/19 Office Visit  Milinda Pointer, MD [facility]  07/26/19 Procedure visit Milinda Pointer, MD [facility]  07/11/19 Office Visit Milinda Pointer, MD [facility]  Showing recent visits within past 90 days and meeting all other requirements   Today's Visits Date Type Provider Dept  09/12/19 Telemedicine Milinda Pointer, MD [facility]  Showing today's visits and meeting all other requirements   Future Appointments Date Type Provider Dept  11/21/19 Appointment Milinda Pointer, MD [facility]  Showing future appointments within next 90 days and meeting all other requirements   I discussed the assessment and treatment plan with the patient. The patient was provided an opportunity to ask questions and all were answered. The patient agreed with the plan and demonstrated an understanding of the instructions.  Patient advised to call back or seek an in-person evaluation if the symptoms or condition worsens.  Total duration of non-face-to-face encounter: 15 minutes.  Note by: Gaspar Cola, MD Date: 09/12/2019; Time: 3:06 PM  Note: This  dictation was prepared with Dragon dictation. Any transcriptional errors that may result from this process are unintentional.  Disclaimer:  * Given the special circumstances of the COVID-19 pandemic, the federal government has announced that the Office for Civil Rights (OCR) will exercise its enforcement discretion and will not impose penalties on physicians using telehealth in the event of noncompliance with regulatory requirements under the [place] (HIPAA) in connection with the good faith provision of telehealth during the XIHWT-88 national public health emergency. ([place])

## 2019-10-10 ENCOUNTER — Ambulatory Visit: Payer: PPO | Admitting: Pain Medicine

## 2019-11-17 ENCOUNTER — Encounter: Payer: Self-pay | Admitting: Pain Medicine

## 2019-11-20 NOTE — Progress Notes (Signed)
Patient: Cynthia Bennett  Service Category: E/M  Provider: Gaspar Cola, MD  DOB: [birth year]  DOS: 11/21/2019  Referring Provider: [name], MD  MRN: [id number]  Setting: Ambulatory outpatient  PCP: [name], MD  Type: Established Patient  Specialty: Interventional Pain Management    Location: External  Delivery: TeleHealth     Virtual Encounter - Pain Management PROVIDER NOTE: Information contained herein reflects review and annotations entered in association with encounter. Interpretation of such information and data should be left to medically-trained personnel. Information provided to patient can be located elsewhere in the medical record under "Patient Instructions". Document created using STT-dictation technology, any transcriptional errors that may result from process are unintentional.    Contact & Pharmacy Preferred: [place]: [phone] (home) Mobile: There is no such number on file (mobile). E-mail: [email or web address]  [facility] [phone] [address] [place] [address] Phone: [phone] Fax: [phone]   Pre-screening  [name] offered "in-person" vs "virtual" encounter. She indicated preferring virtual for this encounter.   Reason COVID-19*  Social distancing based on CDC and AMA recommendations.   I contacted [name] on 11/21/2019 via telephone.      I clearly identified myself as Gaspar Cola, MD. I verified that I was speaking with the correct person using two identifiers (Name: Cynthia Bennett, and date of birth: Aug 31, 1933).  Consent I sought verbal advanced consent from [name] for virtual visit interactions. I informed [name] of possible security and privacy concerns, risks, and limitations associated with providing "not-in-person" medical evaluation and management services. I also informed [name] of the availability of "in-person"  appointments. Finally, I informed her that there would be a charge for the virtual visit and that she could be  personally, fully or partially, financially responsible for it. [name] expressed understanding and agreed to proceed.   Historic Elements   Cynthia Bennett is a 86 y.o. year old, female patient evaluated today after her last encounter by our practice on 08/26/2019. Ms. Port  has a past medical history of Dizzy spells, Fibromyalgia, Glaucoma, Hiatal hernia, Osteoarthritis, Osteoarthritis of spine with radiculopathy, lumbar region (08/14/2015), Peptic ulcer disease, Reflux, Spinal stenosis, Stroke ([place]), and TIA (transient ischemic attack). She also  has a past surgical history that includes Esophagogastroduodenoscopy (N/A, 04/16/2015); Appendectomy; and Abdominal hysterectomy. [name] has a current medication list which includes the following prescription(s): acetaminophen, bupropion, vitamin d3, dorzolamide-timolol, doxepin, esomeprazole, cvs slow release iron, multivitamin, [START ON 12/08/2019] oxycodone, [START ON 01/07/2020] oxycodone, [START ON 02/06/2020] oxycodone, pantoprazole, sertraline, and zolpidem. She  reports that she has quit smoking. She has never used smokeless tobacco. She reports that she does not drink alcohol or use drugs. [name] is allergic to ibuprofen and nsaids.   HPI  Today, she is being contacted for medication management.  The patient indicates doing well with the current medication regimen. No adverse reactions or side effects reported to the medications.  She refers that she still having some problems with standing from prolonged periods of time.  She indicates that it is secondary to the pain and not weakness.  Today I have encouraged her to continue working on lowering her weight and to exercise on a regular basis.  I reminded her that the exercise did not have to be anything hard, just some walking.  Pharmacotherapy Assessment  Analgesic: Oxycodone IR  5 mg, 1 tab  PO BID (10 mg/day of oxycodone) MME/day: 15 mg/day.   Monitoring: Pharmacotherapy: No side-effects or adverse reactions reported. [place] PMP: PDMP reviewed during this encounter.       Compliance: No problems identified. Effectiveness: Clinically acceptable. Plan: Refer to "POC".  UDS:  Summary  Date Value Ref Range Status  01/10/2019 FINAL  Final    Comment:    ==================================================================== TOXASSURE SELECT 13 (MW) ==================================================================== Test                             Result       Flag       Units Drug Present and Declared for Prescription Verification   Noroxycodone                   500          EXPECTED   ng/mg creat    Noroxycodone is an expected metabolite of oxycodone. Sources of    oxycodone include scheduled prescription medications. Drug Absent but Declared for Prescription Verification   Oxycodone                      Not Detected UNEXPECTED ng/mg creat    Oxycodone is almost always present in patients taking this drug    consistently.  Absence of oxycodone could be due to lapse of time    since the last dose or unusual pharmacokinetics (rapid    metabolism). ==================================================================== Test                      Result    Flag   Units      Ref Range   Creatinine              128              mg/dL      >=28 ==================================================================== Declared Medications:  The flagging and interpretation on this report are based on the  following declared medications.  Unexpected results may arise from  inaccuracies in the declared medications.  **Note: The testing scope of this panel includes these medications:  Oxycodone  **Note: The testing scope of this panel does not include following  reported medications:  Acetaminophen (Tylenol)  Bupropion (Wellbutrin)  Dorzolamide  Doxepin  Meclizine  Multivitamin   Pantoprazole (Protonix)  Sertraline (Zoloft)  Zolpidem (Ambien) ==================================================================== For clinical consultation, please call [phone]. ====================================================================    Laboratory Chemistry Profile (12 mo)  Renal: 01/10/2019: BUN 14; BUN/Creatinine Ratio 11; Creatinine, Ser 1.26  Lab Results  Component Value Date   GFRAA 46 (L) 01/10/2019   GFRNONAA 39 (L) 01/10/2019   Hepatic: 01/10/2019: Albumin 4.3 Lab Results  Component Value Date   AST 18 01/10/2019   ALT 11 (L) 06/24/2016   Other: 01/10/2019: 25-Hydroxy, Vitamin D 26; 25-Hydroxy, Vitamin D-2 1.6; 25-Hydroxy, Vitamin D-3 24; CRP 2; Sed Rate 51; Vitamin B-12 1,471 Note: Above Lab results reviewed.  Imaging  DG PAIN CLINIC C-ARM 1-60 MIN NO REPORT Fluoro was used, but no Radiologist interpretation will be provided.  Please refer to "NOTES" tab for provider progress note.   Assessment  The primary encounter diagnosis was Chronic pain syndrome. Diagnoses of Chronic low back pain (Primary Area of Pain) (Bilateral) (L>R), Chronic sacroiliac joint pain (Right), Chronic hip pain (Right), and Chronic lower extremity pain (Secondary area of Pain) (Left) were also pertinent to this visit.  Plan of Care  Problem-specific:  No problem-specific Assessment & Plan notes found for this encounter.  I have discontinued Lucendia Herrlich B. Brashier's meclizine. I am also having her start on oxyCODONE and oxyCODONE. Additionally, I am having her maintain her doxepin, dorzolamide-timolol, buPROPion, zolpidem, pantoprazole, sertraline, acetaminophen, multivitamin, Vitamin D3, CVS Slow Release Iron, esomeprazole, and oxyCODONE.  Pharmacotherapy (Medications Ordered): Meds ordered this encounter  Medications  . oxyCODONE (OXY IR/ROXICODONE) 5 MG immediate release tablet    Sig: Take 1 tablet (5 mg total) by mouth every 12 (twelve) hours as needed for severe pain. Must last 30  days    Dispense:  60 tablet    Refill:  0    Chronic Pain: STOP Act (Not applicable) Fill 1 day early if closed on refill date. Do not fill until: 12/08/2019. To last until: 01/07/2020. Avoid benzodiazepines within 8 hours of opioids  . oxyCODONE (OXY IR/ROXICODONE) 5 MG immediate release tablet    Sig: Take 1 tablet (5 mg total) by mouth every 12 (twelve) hours as needed for severe pain. Must last 30 days    Dispense:  60 tablet    Refill:  0    Chronic Pain: STOP Act (Not applicable) Fill 1 day early if closed on refill date. Do not fill until: 01/07/2020. To last until: 02/06/2020. Avoid benzodiazepines within 8 hours of opioids  . oxyCODONE (OXY IR/ROXICODONE) 5 MG immediate release tablet    Sig: Take 1 tablet (5 mg total) by mouth every 12 (twelve) hours as needed for severe pain. Must last 30 days    Dispense:  60 tablet    Refill:  0    Chronic Pain: STOP Act (Not applicable) Fill 1 day early if closed on refill date. Do not fill until: 02/06/2020. To last until: 03/07/2020. Avoid benzodiazepines within 8 hours of opioids   Orders:  No orders of the defined types were placed in this encounter.  Follow-up plan:   Return in about 4 months (around 03/07/2020) for (VV), (MM).      Interventional management options:  Considering: Possible bilateral lumbar facet RFA Possible bilateral sacroiliac joint RFA Diagnostic left L4 TFESI  Diagnostic bilateral IA hip joint injection Diagnostic bilateral femoral nerve + obturator NB Possible bilateral femoral nerve + obturator nerve RFA   Palliative PRN treatment(s): Palliative left L4-5LESI #5  Diagnostic right L4-5 LESI #2  Diagnostic right L5-S1 LESI #2  Diagnostic left L5-S1 LESI #3  Palliative left IA shoulder joint injection #2  Diagnosticleft AC joint injection + MNB (L) Biceps muscle #2  Palliative right SI joint block #3  Palliative trigger point injections         Recent Visits Date Type Provider Dept  09/12/19  Telemedicine Delano Metz, MD [facility]  08/25/19 Procedure visit Delano Metz, MD [facility]  Showing recent visits within past 90 days and meeting all other requirements   Today's Visits Date Type Provider Dept  11/21/19 Telemedicine Delano Metz, MD [facility]  Showing today's visits and meeting all other requirements   Future Appointments No visits were found meeting these conditions.  Showing future appointments within next 90 days and meeting all other requirements   I discussed the assessment and treatment plan with the patient. The patient was provided an opportunity to ask questions and all were answered. The patient agreed with the plan and demonstrated an understanding of the instructions.  Patient advised to call back or seek an in-person evaluation if the symptoms or condition  worsens.  Total duration of non-face-to-face encounter: 13 minutes.  Note by: Oswaldo Done, MD Date: 11/21/2019; Time: 10:22 AM

## 2019-11-21 ENCOUNTER — Ambulatory Visit: Payer: PPO | Attending: Pain Medicine | Admitting: Pain Medicine

## 2019-11-21 ENCOUNTER — Other Ambulatory Visit: Payer: Self-pay

## 2019-11-21 ENCOUNTER — Telehealth: Payer: Self-pay | Admitting: *Deleted

## 2019-11-21 DIAGNOSIS — M25551 Pain in right hip: Secondary | ICD-10-CM | POA: Diagnosis not present

## 2019-11-21 DIAGNOSIS — M545 Low back pain, unspecified: Secondary | ICD-10-CM

## 2019-11-21 DIAGNOSIS — G894 Chronic pain syndrome: Secondary | ICD-10-CM | POA: Diagnosis not present

## 2019-11-21 DIAGNOSIS — M79605 Pain in left leg: Secondary | ICD-10-CM

## 2019-11-21 DIAGNOSIS — G8929 Other chronic pain: Secondary | ICD-10-CM

## 2019-11-21 DIAGNOSIS — M533 Sacrococcygeal disorders, not elsewhere classified: Secondary | ICD-10-CM

## 2019-11-21 MED ORDER — OXYCODONE HCL 5 MG PO TABS: 5.0000 mg | ORAL_TABLET | Freq: Two times a day (BID) | ORAL | 0 refills | Status: DC | PRN

## 2019-12-06 ENCOUNTER — Ambulatory Visit: Payer: PPO | Admitting: Pain Medicine

## 2019-12-20 DIAGNOSIS — Z961 Presence of intraocular lens: Secondary | ICD-10-CM | POA: Diagnosis not present

## 2019-12-20 DIAGNOSIS — H401132 Primary open-angle glaucoma, bilateral, moderate stage: Secondary | ICD-10-CM | POA: Diagnosis not present

## 2019-12-20 DIAGNOSIS — H353131 Nonexudative age-related macular degeneration, bilateral, early dry stage: Secondary | ICD-10-CM | POA: Diagnosis not present

## 2020-04-02 NOTE — Progress Notes (Signed)
Patient: Cynthia Bennett  Service Category: E/M  Provider: Gaspar Cola, MD  DOB: Jan 05, 1933  DOS: 04/04/2020  Location: Office  MRN: [id number]  Setting: Ambulatory outpatient  Referring Provider: [name], MD  Type: Established Patient  Specialty: Interventional Pain Management  PCP: [name], MD  Location: Remote location  Delivery: TeleHealth     Virtual Encounter - Pain Management PROVIDER NOTE: Information contained herein reflects review and annotations entered in association with encounter. Interpretation of such information and data should be left to medically-trained personnel. Information provided to patient can be located elsewhere in the medical record under "Patient Instructions". Document created using STT-dictation technology, any transcriptional errors that may result from process are unintentional.    Contact & Pharmacy Preferred: [place]: [phone] (home) Mobile: There is no such number on file (mobile). E-mail: [email or web address]  [id number] [place] [id number]: [phone]: [phone]  Pre-screening  Cynthia Bennett offered "in-person" vs "virtual" encounter. She indicated preferring virtual for this encounter.   Reason COVID-19*  Social distancing based on CDC and AMA recommendations.   I contacted Cynthia Bennett 04/04/2020 via telephone.      I clearly identified myself as FGaspar Cola MD. I verified that I was speaking with the correct person using two identifiers (Name: Cynthia Bennett and date of birth: [birth year].  Consent I sought verbal advanced consent from Cynthia Bennett virtual visit interactions. I informed Cynthia Bennett of possible security and privacy concerns, risks, and limitations associated with providing "not-in-person" medical evaluation and management services. I also informed Cynthia Bennett of the availability of "in-person" appointments. Finally, I informed  her that there would be Bennett charge for the virtual visit and that she could be  personally, fully or partially, financially responsible for it. [name] understanding and agreed to proceed.   Historic Elements   Ms. FCHERYLIN WAGUESPACKis Bennett 86y.o. year old, female patient evaluated today after her last contact with our practice on 11/21/2019. Cynthia Bennett has Bennett past medical history of Dizzy spells, Fibromyalgia, Glaucoma, Hiatal hernia, Osteoarthritis, Osteoarthritis of spine with radiculopathy, lumbar region (08/14/2015), Peptic ulcer disease, Reflux, Spinal stenosis, [place], and TIA (transient ischemic attack). She also  has Bennett past surgical history that includes Esophagogastroduodenoscopy (N/Bennett, 04/16/2015); Appendectomy; and Abdominal hysterectomy. Ms. WFrancahas Bennett current medication list which includes the following prescription(s): acetaminophen, bupropion, vitamin d3, dorzolamide-timolol, doxepin, esomeprazole, [facility] slow release iron, multivitamin, oxycodone, [START ON 05/04/2020] oxycodone, [START ON 06/03/2020] oxycodone, pantoprazole, sertraline, and zolpidem. She  reports that she has quit smoking. She has never used smokeless tobacco. She reports that she does not drink alcohol or use drugs. Cynthia Bennett allergic to ibuprofen and nsaids.   HPI  Today, she is being contacted for medication management.  According to the patient's PMP, she has been using an average of 1 pill/day.  Therefore, today's prescriptions will reflect that change.  Today we [facility] care [facility] the problems that we have been encountering with [facility] having unreliable supplies of opioid analgesics.  In addition, the patient also indicated that she personally had some problems with them where they lost some of her prescriptions.  In addition to the above, the patient indicates having Bennett flareup of her low back pain, bilaterally, with the left being worse than the right.  In  terms of the pain it is going further down the left buttocks.  She denies any pain in the lower extremities but does admit to numbness that is going all the way down to the top of her foot and what seems to be an L5 dermatomal distribution.  She indicates that she is currently very limited as to how long she can stand.  She says that she can stand for less than 10 minutes before she has to sit down again.  This is Bennett recurrence of prior problems and she has requested that we repeat the injections which usually provide her with excellent relief of the pain.  The last procedure that we did for her was Bennett right-sided sacroiliac joint injection on October 2020.Marland Kitchen  However, on the left side, we have been doing left interlaminar L4-5 LESI, which usually provides her with good relief of the pain in the area of the back and the lower extremity.  Pharmacotherapy Assessment  Analgesic: Oxycodone IR 5 mg, 1 tab PO BID (maximum of 10 mg/day of oxycodone.  On the average she uses 5 mg/day) MME/day:  7.5 mg/day.   Monitoring: [place] PMP: PDMP reviewed during this encounter.       Pharmacotherapy: No side-effects or adverse reactions reported. Compliance: No problems identified. Effectiveness: Clinically acceptable. Plan: Refer to "POC".  UDS:  Summary  Date Value Ref Range Status  01/10/2019 FINAL  Final    Comment:    ==================================================================== TOXASSURE SELECT 13 (MW) ==================================================================== Test                             Result       Flag       Units Drug Present and Declared for Prescription Verification   Noroxycodone                   500          EXPECTED   ng/mg creat    Noroxycodone is an expected metabolite of oxycodone. Sources of    oxycodone include scheduled prescription medications. Drug Absent but Declared for Prescription Verification   Oxycodone                      Not Detected UNEXPECTED ng/mg creat     Oxycodone is almost always present in patients taking this drug    consistently.  Absence of oxycodone could be due to lapse of time    since the last dose or unusual pharmacokinetics (rapid    metabolism). ==================================================================== Test                      Result    Flag   Units      Ref Range   Creatinine              128              mg/dL      >=20 ==================================================================== Declared Medications:  The flagging and interpretation on this report are based on the  following declared medications.  Unexpected results may arise from  inaccuracies in the declared medications.  **Note: The testing scope of this panel includes these medications:  Oxycodone  **Note: The testing scope of this panel does not include following  reported medications:  Acetaminophen (Tylenol)  Bupropion (Wellbutrin)  Dorzolamide  Doxepin  Meclizine  Multivitamin  Pantoprazole (Protonix)  Sertraline (Zoloft)  Zolpidem (Ambien) ====================================================================  For clinical consultation, please call [phone]. ====================================================================    Laboratory Chemistry Profile   Renal Lab Results  Component Value Date   BUN 14 01/10/2019   CREATININE 1.26 (H) 01/10/2019   BCR 11 (L) 01/10/2019   GFRAA 46 (L) 01/10/2019   GFRNONAA 39 (L) 01/10/2019     Hepatic Lab Results  Component Value Date   AST 18 01/10/2019   ALT 11 (L) 06/24/2016   ALBUMIN 4.3 01/10/2019   ALKPHOS 74 01/10/2019   LIPASE 139 04/27/2014     Electrolytes Lab Results  Component Value Date   NA 141 01/10/2019   K 4.7 01/10/2019   CL 104 01/10/2019   CALCIUM 9.5 01/10/2019   MG 2.3 01/10/2019     Bone Lab Results  Component Value Date   25OHVITD1 26 (L) 01/10/2019   25OHVITD2 1.6 01/10/2019   25OHVITD3 24 01/10/2019     Inflammation (CRP: Acute Phase)  (ESR: Chronic Phase) Lab Results  Component Value Date   CRP 2 01/10/2019   ESRSEDRATE 51 (H) 01/10/2019       Note: Above Lab results reviewed.  Imaging  DG PAIN CLINIC C-ARM 1-60 MIN NO REPORT Fluoro was used, but no Radiologist interpretation will be provided.  Please refer to "NOTES" tab for provider progress note.  Assessment  The primary encounter diagnosis was Chronic pain syndrome. Diagnoses of Chronic low back pain (Primary Area of Pain) (Bilateral) (L>R), Chronic lower extremity pain (Secondary area of Pain) (Left), Pharmacologic therapy, Bertolotti's syndrome (Left), Chronic hip pain (Left), Chronic lumbar radicular pain (Left) (L5 Dermatome), Grade 1 spondylolisthesis of L4 over L5, and Severe (5-6 mm) L4-5 lumbar spinal stenosis and (8 mm) L3-4 spinal stenosis. were also pertinent to this visit.  Plan of Care  Problem-specific:  No problem-specific Assessment & Plan notes found for this encounter.  Cynthia Bennett has Bennett current medication list which includes the following long-term medication(s): bupropion, doxepin, esomeprazole, [facility] slow release iron, oxycodone, [START ON 05/04/2020] oxycodone, [START ON 06/03/2020] oxycodone, pantoprazole, and zolpidem.  Pharmacotherapy (Medications Ordered): Meds ordered this encounter  Medications  . oxyCODONE (OXY IR/ROXICODONE) 5 MG immediate release tablet    Sig: Take 1 tablet (5 mg total) by mouth every 12 (twelve) hours as needed for severe pain. Must last 30 days    Dispense:  30 tablet    Refill:  0    Chronic Pain: STOP Act (Not applicable) Fill 1 day early if closed on refill date. Do not fill until: 04/04/2020. To last until: 05/04/2020. Avoid benzodiazepines within 8 hours of opioids  . oxyCODONE (OXY IR/ROXICODONE) 5 MG immediate release tablet    Sig: Take 1 tablet (5 mg total) by mouth every 12 (twelve) hours as needed for severe pain. Must last 30 days    Dispense:  30 tablet    Refill:  0    Chronic Pain: STOP Act (Not  applicable) Fill 1 day early if closed on refill date. Do not fill until: 05/04/2020. To last until: 06/03/2020. Avoid benzodiazepines within 8 hours of opioids  . oxyCODONE (OXY IR/ROXICODONE) 5 MG immediate release tablet    Sig: Take 1 tablet (5 mg total) by mouth every 12 (twelve) hours as needed for severe pain. Must last 30 days    Dispense:  30 tablet    Refill:  0    Chronic Pain: STOP Act (Not applicable) Fill 1 day early if closed on refill date. Do not fill until: 06/03/2020. To last until: 07/03/2020.  Avoid benzodiazepines within 8 hours of opioids   Orders:  Orders Placed This Encounter  Procedures  . Lumbar Epidural Injection    Standing Status:   Future    Standing Expiration Date:   05/05/2020    Scheduling Instructions:     Procedure: Interlaminar Lumbar Epidural Steroid injection (LESI)  L4-5     Laterality: Left-sided     Sedation: Patient's choice.     Timeframe: ASAA    Order Specific Question:   Where will this procedure be performed?    Answer:   [facility]  . ToxASSURE Select 13 (MW), Urine    Volume: 30 ml(s). Minimum 3 ml of urine is needed. Document temperature of fresh sample. Indications: Long term (current) use of opiate analgesic (J49.494)    Order Specific Question:   Release to patient    Answer:   Immediate   Follow-up plan:   Return in about 3 months (around 07/02/2020) for (F2F), (MM), in addition, Procedure (w/ sedation): (L) L4-5 LESI, (ASAP).      Interventional management options:  Considering: Possible bilateral lumbar facet RFA Possible bilateral sacroiliac joint RFA Diagnostic left L4 TFESI  Diagnostic bilateral IA hip joint injection Diagnostic bilateral femoral nerve + obturator NB Possible bilateral femoral nerve + obturator nerve RFA   Palliative PRN treatment(s): Palliative left L4-5LESI #5  Diagnostic right L4-5 LESI #2  Diagnostic right L5-S1 LESI #2  Diagnostic left L5-S1 LESI #3  Palliative left IA shoulder  joint injection #2  Diagnosticleft AC joint injection + MNB (L) Biceps muscle #2  Palliative right SI joint block #3  Palliative trigger point injections          Recent Visits No visits were found meeting these conditions.  Showing recent visits within past 90 days and meeting all other requirements   Today's Visits Date Type Provider Dept  04/04/20 Telemedicine Milinda Pointer, MD [facility]  Showing today's visits and meeting all other requirements   Future Appointments No visits were found meeting these conditions.  Showing future appointments within next 90 days and meeting all other requirements   I discussed the assessment and treatment plan with the patient. The patient was provided an opportunity to ask questions and all were answered. The patient agreed with the plan and demonstrated an understanding of the instructions.  Patient advised to call back or seek an in-person evaluation if the symptoms or condition worsens.  Duration of encounter: 22 minutes.  Note by: Gaspar Cola, MD Date: 04/04/2020; Time: 8:57 AM

## 2020-04-04 ENCOUNTER — Ambulatory Visit: Payer: PPO | Attending: Pain Medicine | Admitting: Pain Medicine

## 2020-04-04 ENCOUNTER — Other Ambulatory Visit: Payer: Self-pay

## 2020-04-04 ENCOUNTER — Telehealth: Payer: Self-pay

## 2020-04-04 DIAGNOSIS — M545 Low back pain: Secondary | ICD-10-CM

## 2020-04-04 DIAGNOSIS — M4316 Spondylolisthesis, lumbar region: Secondary | ICD-10-CM

## 2020-04-04 DIAGNOSIS — M5416 Radiculopathy, lumbar region: Secondary | ICD-10-CM | POA: Diagnosis not present

## 2020-04-04 DIAGNOSIS — M79605 Pain in left leg: Secondary | ICD-10-CM | POA: Diagnosis not present

## 2020-04-04 DIAGNOSIS — Q7649 Other congenital malformations of spine, not associated with scoliosis: Secondary | ICD-10-CM | POA: Diagnosis not present

## 2020-04-04 DIAGNOSIS — M25552 Pain in left hip: Secondary | ICD-10-CM | POA: Diagnosis not present

## 2020-04-04 DIAGNOSIS — G8929 Other chronic pain: Secondary | ICD-10-CM | POA: Diagnosis not present

## 2020-04-04 DIAGNOSIS — G894 Chronic pain syndrome: Secondary | ICD-10-CM

## 2020-04-04 DIAGNOSIS — M48062 Spinal stenosis, lumbar region with neurogenic claudication: Secondary | ICD-10-CM

## 2020-04-04 DIAGNOSIS — Z79899 Other long term (current) drug therapy: Secondary | ICD-10-CM

## 2020-04-04 MED ORDER — OXYCODONE HCL 5 MG PO TABS: 5.0000 mg | ORAL_TABLET | Freq: Two times a day (BID) | ORAL | 0 refills | Status: DC | PRN

## 2020-04-04 NOTE — Patient Instructions (Signed)
____________________________________________________________________________________________  Preparing for Procedure with Sedation  Procedure appointments are limited to planned procedures: . No Prescription Refills. . No disability issues will be discussed. . No medication changes will be discussed.  Instructions: . Oral Intake: Do not eat or drink anything for at least 8 hours prior to your procedure. (Exception: Blood Pressure Medication. See below.) . Transportation: Unless otherwise stated by your physician, you may drive yourself after the procedure. . Blood Pressure Medicine: Do not forget to take your blood pressure medicine with a sip of water the morning of the procedure. If your Diastolic (lower reading)is above 100 mmHg, elective cases will be cancelled/rescheduled. . Blood thinners: These will need to be stopped for procedures. Notify our staff if you are taking any blood thinners. Depending on which one you take, there will be specific instructions on how and when to stop it. . Diabetics on insulin: Notify the staff so that you can be scheduled 1st case in the morning. If your diabetes requires high dose insulin, take only  of your normal insulin dose the morning of the procedure and notify the staff that you have done so. . Preventing infections: Shower with an antibacterial soap the morning of your procedure. . Build-up your immune system: Take 1000 mg of Vitamin C with every meal (3 times a day) the day prior to your procedure. . Antibiotics: Inform the staff if you have a condition or reason that requires you to take antibiotics before dental procedures. . Pregnancy: If you are pregnant, call and cancel the procedure. . Sickness: If you have a cold, fever, or any active infections, call and cancel the procedure. . Arrival: You must be in the facility at least 30 minutes prior to your scheduled procedure. . Children: Do not bring children with you. . Dress appropriately:  Bring dark clothing that you would not mind if they get stained. . Valuables: Do not bring any jewelry or valuables.  Reasons to call and reschedule or cancel your procedure: (Following these recommendations will minimize the risk of a serious complication.) . Surgeries: Avoid having procedures within 2 weeks of any surgery. (Avoid for 2 weeks before or after any surgery). . Flu Shots: Avoid having procedures within 2 weeks of a flu shots or . (Avoid for 2 weeks before or after immunizations). . Barium: Avoid having a procedure within 7-10 days after having had a radiological study involving the use of radiological contrast. (Myelograms, Barium swallow or enema study). . Heart attacks: Avoid any elective procedures or surgeries for the initial 6 months after a "Myocardial Infarction" (Heart Attack). . Blood thinners: It is imperative that you stop these medications before procedures. Let us know if you if you take any blood thinner.  . Infection: Avoid procedures during or within two weeks of an infection (including chest colds or gastrointestinal problems). Symptoms associated with infections include: Localized redness, fever, chills, night sweats or profuse sweating, burning sensation when voiding, cough, congestion, stuffiness, runny nose, sore throat, diarrhea, nausea, vomiting, cold or Flu symptoms, recent or current infections. It is specially important if the infection is over the area that we intend to treat. . Heart and lung problems: Symptoms that may suggest an active cardiopulmonary problem include: cough, chest pain, breathing difficulties or shortness of breath, dizziness, ankle swelling, uncontrolled high or unusually low blood pressure, and/or palpitations. If you are experiencing any of these symptoms, cancel your procedure and contact your primary care physician for an evaluation.  Remember:  Regular Business hours are:    Monday to Thursday 8:00 AM to 4:00 PM  Provider's  Schedule: Javonni Macke, MD:  Procedure days: Tuesday and Thursday 7:30 AM to 4:00 PM  Bilal Lateef, MD:  Procedure days: Monday and Wednesday 7:30 AM to 4:00 PM ____________________________________________________________________________________________   ____________________________________________________________________________________________  General Risks and Possible Complications  Patient Responsibilities: It is important that you read this as it is part of your informed consent. It is our duty to inform you of the risks and possible complications associated with treatments offered to you. It is your responsibility as a patient to read this and to ask questions about anything that is not clear or that you believe was not covered in this document.  Patient's Rights: You have the right to refuse treatment. You also have the right to change your mind, even after initially having agreed to have the treatment done. However, under this last option, if you wait until the last second to change your mind, you may be charged for the materials used up to that point.  Introduction: Medicine is not an exact science. Everything in Medicine, including the lack of treatment(s), carries the potential for danger, harm, or loss (which is by definition: Risk). In Medicine, a complication is a secondary problem, condition, or disease that can aggravate an already existing one. All treatments carry the risk of possible complications. The fact that a side effects or complications occurs, does not imply that the treatment was conducted incorrectly. It must be clearly understood that these can happen even when everything is done following the highest safety standards.  No treatment: You can choose not to proceed with the proposed treatment alternative. The "PRO(s)" would include: avoiding the risk of complications associated with the therapy. The "CON(s)" would include: not getting any of the treatment  benefits. These benefits fall under one of three categories: diagnostic; therapeutic; and/or palliative. Diagnostic benefits include: getting information which can ultimately lead to improvement of the disease or symptom(s). Therapeutic benefits are those associated with the successful treatment of the disease. Finally, palliative benefits are those related to the decrease of the primary symptoms, without necessarily curing the condition (example: decreasing the pain from a flare-up of a chronic condition, such as incurable terminal cancer).  General Risks and Complications: These are associated to most interventional treatments. They can occur alone, or in combination. They fall under one of the following six (6) categories: no benefit or worsening of symptoms; bleeding; infection; nerve damage; allergic reactions; and/or death. 1. No benefits or worsening of symptoms: In Medicine there are no guarantees, only probabilities. No healthcare provider can ever guarantee that a medical treatment will work, they can only state the probability that it may. Furthermore, there is always the possibility that the condition may worsen, either directly, or indirectly, as a consequence of the treatment. 2. Bleeding: This is more common if the patient is taking a blood thinner, either prescription or over the counter (example: Goody Powders, Fish oil, Aspirin, Garlic, etc.), or if suffering a condition associated with impaired coagulation (example: Hemophilia, cirrhosis of the liver, low platelet counts, etc.). However, even if you do not have one on these, it can still happen. If you have any of these conditions, or take one of these drugs, make sure to notify your treating physician. 3. Infection: This is more common in patients with a compromised immune system, either due to disease (example: diabetes, cancer, human immunodeficiency virus [HIV], etc.), or due to medications or treatments (example: therapies used to treat  cancer and   rheumatological diseases). However, even if you do not have one on these, it can still happen. If you have any of these conditions, or take one of these drugs, make sure to notify your treating physician. 4. Nerve Damage: This is more common when the treatment is an invasive one, but it can also happen with the use of medications, such as those used in the treatment of cancer. The damage can occur to small secondary nerves, or to large primary ones, such as those in the spinal cord and brain. This damage may be temporary or permanent and it may lead to impairments that can range from temporary numbness to permanent paralysis and/or brain death. 5. Allergic Reactions: Any time a substance or material comes in contact with our body, there is the possibility of an allergic reaction. These can range from a mild skin rash (contact dermatitis) to a severe systemic reaction (anaphylactic reaction), which can result in death. 6. Death: In general, any medical intervention can result in death, most of the time due to an unforeseen complication. ____________________________________________________________________________________________   

## 2020-04-10 ENCOUNTER — Other Ambulatory Visit: Payer: Self-pay

## 2020-04-10 ENCOUNTER — Ambulatory Visit (HOSPITAL_BASED_OUTPATIENT_CLINIC_OR_DEPARTMENT_OTHER): Payer: PPO | Admitting: Pain Medicine

## 2020-04-10 ENCOUNTER — Encounter: Payer: Self-pay | Admitting: Pain Medicine

## 2020-04-10 ENCOUNTER — Ambulatory Visit
Admission: RE | Admit: 2020-04-10 | Discharge: 2020-04-10 | Disposition: A | Discharge status: Home / Self Care | Payer: PPO | Source: Ambulatory Visit | Attending: Pain Medicine | Admitting: Pain Medicine

## 2020-04-10 VITALS — BP 122/58 | HR 87 | Temp 98.2°F | Resp 16 | Ht 63.0 in | Wt 160.0 lb

## 2020-04-10 DIAGNOSIS — M47816 Spondylosis without myelopathy or radiculopathy, lumbar region: Secondary | ICD-10-CM | POA: Insufficient documentation

## 2020-04-10 DIAGNOSIS — G8929 Other chronic pain: Secondary | ICD-10-CM | POA: Diagnosis not present

## 2020-04-10 DIAGNOSIS — M4316 Spondylolisthesis, lumbar region: Secondary | ICD-10-CM | POA: Diagnosis not present

## 2020-04-10 DIAGNOSIS — G894 Chronic pain syndrome: Secondary | ICD-10-CM | POA: Diagnosis not present

## 2020-04-10 DIAGNOSIS — M48062 Spinal stenosis, lumbar region with neurogenic claudication: Secondary | ICD-10-CM | POA: Diagnosis not present

## 2020-04-10 DIAGNOSIS — M5126 Other intervertebral disc displacement, lumbar region: Secondary | ICD-10-CM

## 2020-04-10 DIAGNOSIS — M5136 Other intervertebral disc degeneration, lumbar region: Secondary | ICD-10-CM | POA: Insufficient documentation

## 2020-04-10 DIAGNOSIS — M5416 Radiculopathy, lumbar region: Secondary | ICD-10-CM | POA: Diagnosis not present

## 2020-04-10 DIAGNOSIS — Z79899 Other long term (current) drug therapy: Secondary | ICD-10-CM | POA: Diagnosis not present

## 2020-04-10 DIAGNOSIS — M545 Low back pain: Secondary | ICD-10-CM | POA: Insufficient documentation

## 2020-04-10 DIAGNOSIS — M79605 Pain in left leg: Secondary | ICD-10-CM | POA: Insufficient documentation

## 2020-04-10 MED ORDER — IOHEXOL 180 MG/ML  SOLN
10.0000 mL | Freq: Once | INTRAMUSCULAR | Status: AC
  Administered 2020-04-10: 10 mL via EPIDURAL

## 2020-04-10 MED ORDER — TRIAMCINOLONE ACETONIDE 40 MG/ML IJ SUSP
40.0000 mg | Freq: Once | INTRAMUSCULAR | Status: AC
  Administered 2020-04-10: 40 mg

## 2020-04-10 MED ORDER — ROPIVACAINE HCL 2 MG/ML IJ SOLN
INTRAMUSCULAR | Status: AC
  Filled 2020-04-10: qty 10

## 2020-04-10 MED ORDER — SODIUM CHLORIDE 0.9% FLUSH
2.0000 mL | Freq: Once | INTRAVENOUS | Status: AC
  Administered 2020-04-10: 2 mL

## 2020-04-10 MED ORDER — ROPIVACAINE HCL 2 MG/ML IJ SOLN
2.0000 mL | Freq: Once | INTRAMUSCULAR | Status: AC
  Administered 2020-04-10: 2 mL via EPIDURAL

## 2020-04-10 MED ORDER — SODIUM CHLORIDE (PF) 0.9 % IJ SOLN
INTRAMUSCULAR | Status: AC
  Filled 2020-04-10: qty 10

## 2020-04-10 MED ORDER — LIDOCAINE HCL 2 % IJ SOLN
INTRAMUSCULAR | Status: AC
  Filled 2020-04-10: qty 20

## 2020-04-10 MED ORDER — LIDOCAINE HCL 2 % IJ SOLN
20.0000 mL | Freq: Once | INTRAMUSCULAR | Status: AC
  Administered 2020-04-10: 400 mg

## 2020-04-10 MED ORDER — TRIAMCINOLONE ACETONIDE 40 MG/ML IJ SUSP
INTRAMUSCULAR | Status: AC
  Filled 2020-04-10: qty 1

## 2020-04-10 NOTE — Progress Notes (Signed)
Safety precautions to be maintained throughout the outpatient stay will include: orient to surroundings, keep bed in low position, maintain call bell within reach at all times, provide assistance with transfer out of bed and ambulation.  

## 2020-04-10 NOTE — Patient Instructions (Addendum)
____________________________________________________________________________________________  Post-Procedure Discharge Instructions  Instructions:  Apply ice:   Purpose: This will minimize any swelling and discomfort after procedure.   When: Day of procedure, as soon as you get home.  How: Fill a plastic sandwich bag with crushed ice. Cover it with a small towel and apply to injection site.  How long: (15 min on, 15 min off) Apply for 15 minutes then remove x 15 minutes.  Repeat sequence on day of procedure, until you go to bed.  Apply heat:   Purpose: To treat any soreness and discomfort from the procedure.  When: Starting the next day after the procedure.  How: Apply heat to procedure site starting the day following the procedure.  How long: May continue to repeat daily, until discomfort goes away.  Food intake: Start with clear liquids (like water) and advance to regular food, as tolerated.   Physical activities: Keep activities to a minimum for the first 8 hours after the procedure. After that, then as tolerated.  Driving: If you have received any sedation, be responsible and do not drive. You are not allowed to drive for 24 hours after having sedation.  Blood thinner: (Applies only to those taking blood thinners) You may restart your blood thinner 6 hours after your procedure.  Insulin: (Applies only to Diabetic patients taking insulin) As soon as you can eat, you may resume your normal dosing schedule.  Infection prevention: Keep procedure site clean and dry. Shower daily and clean area with soap and water.  Post-procedure Pain Diary: Extremely important that this be done correctly and accurately. Recorded information will be used to determine the next step in treatment. For the purpose of accuracy, follow these rules:  Evaluate only the area treated. Do not report or include pain from an untreated area. For the purpose of this evaluation, ignore all other areas of pain,  except for the treated area.  After your procedure, avoid taking a long nap and attempting to complete the pain diary after you wake up. Instead, set your alarm clock to go off every hour, on the hour, for the initial 8 hours after the procedure. Document the duration of the numbing medicine, and the relief you are getting from it.  Do not go to sleep and attempt to complete it later. It will not be accurate. If you received sedation, it is likely that you were given a medication that may cause amnesia. Because of this, completing the diary at a later time may cause the information to be inaccurate. This information is needed to plan your care.  Follow-up appointment: Keep your post-procedure follow-up evaluation appointment after the procedure (usually 2 weeks for most procedures, 6 weeks for radiofrequencies). DO NOT FORGET to bring you pain diary with you.   Expect: (What should I expect to see with my procedure?)  From numbing medicine (AKA: Local Anesthetics): Numbness or decrease in pain. You may also experience some weakness, which if present, could last for the duration of the local anesthetic.  Onset: Full effect within 15 minutes of injected.  Duration: It will depend on the type of local anesthetic used. On the average, 1 to 8 hours.   From steroids (Applies only if steroids were used): Decrease in swelling or inflammation. Once inflammation is improved, relief of the pain will follow.  Onset of benefits: Depends on the amount of swelling present. The more swelling, the longer it will take for the benefits to be seen. In some cases, up to 10 days.    Duration: Steroids will stay in the system x 2 weeks. Duration of benefits will depend on multiple posibilities including persistent irritating factors.  Side-effects: If present, they may typically last 2 weeks (the duration of the steroids).  Frequent: Cramps (if they occur, drink Gatorade and take over-the-counter Magnesium 450-500 mg  once to twice a day); water retention with temporary weight gain; increases in blood sugar; decreased immune system response; increased appetite.  Occasional: Facial flushing (red, warm cheeks); mood swings; menstrual changes.  Uncommon: Long-term decrease or suppression of natural hormones; bone thinning. (These are more common with higher doses or more frequent use. This is why we prefer that our patients avoid having any injection therapies in other practices.)   Very Rare: Severe mood changes; psychosis; aseptic necrosis.  From procedure: Some discomfort is to be expected once the numbing medicine wears off. This should be minimal if ice and heat are applied as instructed.  Call if: (When should I call?)  You experience numbness and weakness that gets worse with time, as opposed to wearing off.  New onset bowel or bladder incontinence. (Applies only to procedures done in the spine)  Emergency Numbers:  Durning business hours (Monday - Thursday, 8:00 AM - 4:00 PM) (Friday, 9:00 AM - 12:00 Noon): [phone]  After hours: [phone]  NOTE: If you are having a problem and are unable connect with, or to talk to a provider, then go to your nearest urgent care or emergency department. If the problem is serious and urgent, please call 911. ____________________________________________________________________________________________   GENERAL RISKS AND COMPLICATIONS  What are the risk, side effects and possible complications? Generally speaking, most procedures are safe.  However, with any procedure there are risks, side effects, and the possibility of complications.  The risks and complications are dependent upon the sites that are lesioned, or the type of nerve block to be performed.  The closer the procedure is to the spine, the more serious the risks are.  Great care is taken when placing the radio frequency needles, block needles or lesioning probes, but sometimes complications  can occur. 1. Infection: Any time there is an injection through the skin, there is a risk of infection.  This is why sterile conditions are used for these blocks.  There are four possible types of infection. 1. Localized skin infection. 2. Central Nervous System Infection-This can be in the form of Meningitis, which can be deadly. 3. Epidural Infections-This can be in the form of an epidural abscess, which can cause pressure inside of the spine, causing compression of the spinal cord with subsequent paralysis. This would require an emergency surgery to decompress, and there are no guarantees that the patient would recover from the paralysis. 4. Discitis-This is an infection of the intervertebral discs.  It occurs in about 1% of discography procedures.  It is difficult to treat and it may lead to surgery.        2. Pain: the needles have to go through skin and soft tissues, will cause soreness.       3. Damage to internal structures:  The nerves to be lesioned may be near blood vessels or    other nerves which can be potentially damaged.       4. Bleeding: Bleeding is more common if the patient is taking blood thinners such as  aspirin, Coumadin, Ticiid, Plavix, etc., or if he/she have some genetic predisposition  such as hemophilia. Bleeding into the spinal canal can cause compression of the   spinal  cord with subsequent paralysis.  This would require an emergency surgery to  decompress and there are no guarantees that the patient would recover from the  paralysis.       5. Pneumothorax:  Puncturing of a lung is a possibility, every time a needle is introduced in  the area of the chest or upper back.  Pneumothorax refers to free air around the  collapsed lung(s), inside of the thoracic cavity (chest cavity).  Another two possible  complications related to a similar event would include: Hemothorax and Chylothorax.   These are variations of the Pneumothorax, where instead of air around the collapsed   lung(s), you may have blood or chyle, respectively.       6. Spinal headaches: They may occur with any procedures in the area of the spine.       7. Persistent CSF (Cerebro-Spinal Fluid) leakage: This is a rare problem, but may occur  with prolonged intrathecal or epidural catheters either due to the formation of a fistulous  track or a dural tear.       8. Nerve damage: By working so close to the spinal cord, there is always a possibility of  nerve damage, which could be as serious as a permanent spinal cord injury with  paralysis.       9. Death:  Although rare, severe deadly allergic reactions known as "Anaphylactic  reaction" can occur to any of the medications used.      10. Worsening of the symptoms:  We can always make thing worse.  What are the chances of something like this happening? Chances of any of this occuring are extremely low.  By statistics, you have more of a chance of getting killed in a motor vehicle accident: while driving to the hospital than any of the above occurring .  Nevertheless, you should be aware that they are possibilities.  In general, it is similar to taking a shower.  Everybody knows that you can slip, hit your head and get killed.  Does that mean that you should not shower again?  Nevertheless always keep in mind that statistics do not mean anything if you happen to be on the wrong side of them.  Even if a procedure has a 1 (one) in a 1,000,000 (million) chance of going wrong, it you happen to be that one..Also, keep in mind that by statistics, you have more of a chance of having something go wrong when taking medications.  Who should not have this procedure? If you are on a blood thinning medication (e.g. Coumadin, Plavix, see list of "Blood Thinners"), or if you have an active infection going on, you should not have the procedure.  If you are taking any blood thinners, please inform your physician.  How should I prepare for this procedure?  Do not eat or drink  anything at least six hours prior to the procedure.  Bring a driver with you .  It cannot be a taxi.  Come accompanied by an adult that can drive you back, and that is strong enough to help you if your legs get weak or numb from the local anesthetic.  Take all of your medicines the morning of the procedure with just enough water to swallow them.  If you have diabetes, make sure that you are scheduled to have your procedure done first thing in the morning, whenever possible.  If you have diabetes, take only half of your insulin dose and notify our nurse   that you have done so as soon as you arrive at the clinic.  If you are diabetic, but only take blood sugar pills (oral hypoglycemic), then do not take them on the morning of your procedure.  You may take them after you have had the procedure.  Do not take aspirin or any aspirin-containing medications, at least eleven (11) days prior to the procedure.  They may prolong bleeding.  Wear loose fitting clothing that may be easy to take off and that you would not mind if it got stained with Betadine or blood.  Do not wear any jewelry or perfume  Remove any nail coloring.  It will interfere with some of our monitoring equipment.  NOTE: Remember that this is not meant to be interpreted as a complete list of all possible complications.  Unforeseen problems may occur.  BLOOD THINNERS The following drugs contain aspirin or other products, which can cause increased bleeding during surgery and should not be taken for 2 weeks prior to and 1 week after surgery.  If you should need take something for relief of minor pain, you may take acetaminophen which is found in Tylenol,m Datril, Anacin-3 and Panadol. It is not blood thinner. The products listed below are.  Do not take any of the products listed below in addition to any listed on your instruction sheet.  A.P.C or A.P.C with Codeine Codeine Phosphate Capsules #3 Ibuprofen Ridaura  ABC compound  Congesprin Imuran rimadil  Advil Cope Indocin Robaxisal  Alka-Seltzer Effervescent Pain Reliever and Antacid Coricidin or Coricidin-D  Indomethacin Rufen  Alka-Seltzer plus Cold Medicine Cosprin Ketoprofen S-A-C Tablets  Anacin Analgesic Tablets or Capsules Coumadin Korlgesic Salflex  Anacin Extra Strength Analgesic tablets or capsules CP-2 Tablets Lanoril Salicylate  Anaprox Cuprimine Capsules Levenox Salocol  Anexsia-D Dalteparin Magan Salsalate  Anodynos Darvon compound Magnesium Salicylate Sine-off  Ansaid Dasin Capsules Magsal Sodium Salicylate  Anturane Depen Capsules Marnal Soma  APF Arthritis pain formula Dewitt's Pills Measurin Stanback  Argesic Dia-Gesic Meclofenamic Sulfinpyrazone  Arthritis Bayer Timed Release Aspirin Diclofenac Meclomen Sulindac  Arthritis pain formula Anacin Dicumarol Medipren Supac  Analgesic (Safety coated) Arthralgen Diffunasal Mefanamic Suprofen  Arthritis Strength Bufferin Dihydrocodeine Mepro Compound Suprol  Arthropan liquid Dopirydamole Methcarbomol with Aspirin Synalgos  ASA tablets/Enseals Disalcid Micrainin Tagament  Ascriptin Doan's Midol Talwin  Ascriptin A/D Dolene Mobidin Tanderil  Ascriptin Extra Strength Dolobid Moblgesic Ticlid  Ascriptin with Codeine Doloprin or Doloprin with Codeine Momentum Tolectin  Asperbuf Duoprin Mono-gesic Trendar  Aspergum Duradyne Motrin or Motrin IB Triminicin  Aspirin plain, buffered or enteric coated Durasal Myochrisine Trigesic  Aspirin Suppositories Easprin Nalfon Trillsate  Aspirin with Codeine Ecotrin Regular or Extra Strength Naprosyn Uracel  Atromid-S Efficin Naproxen Ursinus  Auranofin Capsules Elmiron Neocylate Vanquish  Axotal Emagrin Norgesic Verin  Azathioprine Empirin or Empirin with Codeine Normiflo Vitamin E  Azolid Emprazil Nuprin Voltaren  Bayer Aspirin plain, buffered or children's or timed BC Tablets or powders Encaprin Orgaran Warfarin Sodium  Buff-a-Comp Enoxaparin Orudis Zorpin   Buff-a-Comp with Codeine Equegesic Os-Cal-Gesic   Buffaprin Excedrin plain, buffered or Extra Strength Oxalid   Bufferin Arthritis Strength Feldene Oxphenbutazone   Bufferin plain or Extra Strength Feldene Capsules Oxycodone with Aspirin   Bufferin with Codeine Fenoprofen Fenoprofen Pabalate or Pabalate-SF   Buffets II Flogesic Panagesic   Buffinol plain or Extra Strength Florinal or Florinal with Codeine Panwarfarin   Buf-Tabs Flurbiprofen Penicillamine   Butalbital Compound Four-way cold tablets Penicillin   Butazolidin Fragmin Pepto-Bismol   Carbenicillin Geminisyn Percodan     Carna Arthritis Reliever Geopen Persantine   Carprofen Gold's salt Persistin   Chloramphenicol Goody's Phenylbutazone   Chloromycetin Haltrain Piroxlcam   Clmetidine heparin Plaquenil   Cllnoril Hyco-pap Ponstel   Clofibrate Hydroxy chloroquine Propoxyphen         Before stopping any of these medications, be sure to consult the physician who ordered them.  Some, such as Coumadin (Warfarin) are ordered to prevent or treat serious conditions such as "deep thrombosis", "pumonary embolisms", and other heart problems.  The amount of time that you may need off of the medication may also vary with the medication and the reason for which you were taking it.  If you are taking any of these medications, please make sure you notify your pain physician before you undergo any procedures.         Pain Management Discharge Instructions  General Discharge Instructions :  If you need to reach your doctor call: Monday-Friday 8:00 am - 4:00 pm at [phone] or toll free [phone].  After clinic hours [phone] to have operator reach doctor.  Bring all of your medication bottles to all your appointments in the pain clinic.  To cancel or reschedule your appointment with Pain Management please remember to call 24 hours in advance to avoid a fee.  Refer to the educational materials which you have been given on:  General Risks, I had my Procedure. Discharge Instructions, Post Sedation.  Post Procedure Instructions:  The drugs you were given will stay in your system until tomorrow, so for the next 24 hours you should not drive, make any legal decisions or drink any alcoholic beverages.  You may eat anything you prefer, but it is better to start with liquids then soups and crackers, and gradually work up to solid foods.  Please notify your doctor immediately if you have any unusual bleeding, trouble breathing or pain that is not related to your normal pain.  Depending on the type of procedure that was done, some parts of your body may feel week and/or numb.  This usually clears up by tonight or the next day.  Walk with the use of an assistive device or accompanied by an adult for the 24 hours.  You may use ice on the affected area for the first 24 hours.  Put ice in a Ziploc bag and cover with a towel and place against area 15 minutes on 15 minutes off.  You may switch to heat after 24 hours.Epidural Steroid Injection Patient Information  Description: The epidural space surrounds the nerves as they exit the spinal cord.  In some patients, the nerves can be compressed and inflamed by a bulging disc or a tight spinal canal (spinal stenosis).  By injecting steroids into the epidural space, we can bring irritated nerves into direct contact with a potentially helpful medication.  These steroids act directly on the irritated nerves and can reduce swelling and inflammation which often leads to decreased pain.  Epidural steroids may be injected anywhere along the spine and from the neck to the low back depending upon the location of your pain.   After numbing the skin with local anesthetic (like Novocaine), a small needle is passed into the epidural space slowly.  You may experience a sensation of pressure while this is being done.  The entire block usually last less than 10 minutes.  Conditions which may be  treated by epidural steroids:   Low back and leg pain  Neck and arm pain  Spinal stenosis  Post-laminectomy   syndrome  Herpes zoster (shingles) pain  Pain from compression fractures  Preparation for the injection:  1. Do not eat any solid food or dairy products within 8 hours of your appointment.  2. You may drink clear liquids up to 3 hours before appointment.  Clear liquids include water, black coffee, juice or soda.  No milk or cream please. 3. You may take your regular medication, including pain medications, with a sip of water before your appointment  Diabetics should hold regular insulin (if taken separately) and take 1/2 normal NPH dos the morning of the procedure.  Carry some sugar containing items with you to your appointment. 4. A driver must accompany you and be prepared to drive you home after your procedure.  5. Bring all your current medications with your. 6. An IV may be inserted and sedation may be given at the discretion of the physician.   7. A blood pressure cuff, EKG and other monitors will often be applied during the procedure.  Some patients may need to have extra oxygen administered for a short period. 8. You will be asked to provide medical information, including your allergies, prior to the procedure.  We must know immediately if you are taking blood thinners (like Coumadin/Warfarin)  Or if you are allergic to IV iodine contrast (dye). We must know if you could possible be pregnant.  Possible side-effects:  Bleeding from needle site  Infection (rare, may require surgery)  Nerve injury (rare)  Numbness & tingling (temporary)  Difficulty urinating (rare, temporary)  Spinal headache ( a headache worse with upright posture)  Light -headedness (temporary)  Pain at injection site (several days)  Decreased blood pressure (temporary)  Weakness in arm/leg (temporary)  Pressure sensation in back/neck (temporary)  Call if you experience:  Fever/chills  associated with headache or increased back/neck pain.  Headache worsened by an upright position.  New onset weakness or numbness of an extremity below the injection site  Hives or difficulty breathing (go to the emergency room)  Inflammation or drainage at the infection site  Severe back/neck pain  Any new symptoms which are concerning to you  Please note:  Although the local anesthetic injected can often make your back or neck feel good for several hours after the injection, the pain will likely return.  It takes 3-7 days for steroids to work in the epidural space.  You may not notice any pain relief for at least that one week.  If effective, we will often do a series of three injections spaced 3-6 weeks apart to maximally decrease your pain.  After the initial series, we generally will wait several months before considering a repeat injection of the same type.  If you have any questions, please call [phone] [facility] 

## 2020-04-10 NOTE — Progress Notes (Signed)
PROVIDER NOTE: Information contained herein reflects review and annotations entered in association with encounter. Interpretation of such information and data should be left to medically-trained personnel. Information provided to patient can be located elsewhere in the medical record under "Patient Instructions". Document created using STT-dictation technology, any transcriptional errors that may result from process are unintentional.    Patient: Cynthia Bennett  Service Category: Procedure  Provider: Gaspar Cola, MD  DOB: [birth year]  DOS: 04/10/2020  Location: [facility]  MRN: [id number]  Setting: Ambulatory - outpatient  Referring Provider: [name], MD  Type: Established Patient  Specialty: Interventional Pain Management  PCP: Cynthia Pink, MD   Primary Reason for Visit: Interventional Pain Management Treatment. CC: Back Pain (left side)  Procedure:          Anesthesia, Analgesia, Anxiolysis:  Type: Palliative Inter-Laminar Epidural Steroid Injection  #5  Region: Lumbar Level: L4-5 Level. Laterality: Left-Sided Paramedial  Type: Local Anesthesia Indication(s): Analgesia         Route: Infiltration ([place]/IM) IV Access: Declined Sedation: Declined  Local Anesthetic: Lidocaine 1-2%  Position: Prone with head of the table was raised to facilitate breathing.   Indications: 1. DDD (degenerative disc disease), lumbar   2. Grade 1 spondylolisthesis of L4 over L5   3. Chronic lumbar radicular pain (Left) (L5 Dermatome)   4. Chronic lower extremity pain (Secondary area of Pain) (Left)   5. Chronic low back pain (Primary Area of Pain) (Bilateral) (L>R)   6. Lumbar discogenic pain syndrome   7. Lumbar spondylosis   8. Severe (5-6 mm) L4-5 lumbar spinal stenosis and (8 mm) L3-4 spinal stenosis.    Pain Score: Pre-procedure: 10-Worst pain ever(when walking)/10 Post-procedure: 5 /10   Pre-op Assessment:  [name] is a 86 y.o. (year old), female patient, seen today  for interventional treatment. She  has a past surgical history that includes Esophagogastroduodenoscopy (N/A, 04/16/2015); Appendectomy; and Abdominal hysterectomy. [name] has a current medication list which includes the following prescription(s): acetaminophen, bupropion, vitamin d3, doxepin, esomeprazole, [facility]vs slow release iron, multivitamin, oxycodone, [START ON 05/04/2020] oxycodone, [START ON 06/03/2020] oxycodone, pantoprazole, sertraline, zolpidem, and dorzolamide-timolol. Her primarily concern today is the Back Pain (left side)  Initial Vital Signs:  Pulse/HCG Rate: 87ECG Heart Rate: 81 Temp: 98.2 F (36.8 C) Resp: 18 BP: 116/89 SpO2: 98 %  BMI: Estimated body mass index is 28.34 kg/m as calculated from the following:   Height as of this encounter: 5\' 3"  (1.6 m).   Weight as of this encounter: 160 lb (72.6 kg).  Risk Assessment: Allergies: Reviewed. She is allergic to ibuprofen and nsaids.  Allergy Precautions: None required Coagulopathies: Reviewed. None identified.  Blood-thinner therapy: None at this time Active Infection(s): Reviewed. None identified. [name] is afebrile  Site Confirmation: [name] was asked to confirm the procedure and laterality before marking the site Procedure checklist: Completed Consent: Before the procedure and under the influence of no sedative(s), amnesic(s), or anxiolytics, the patient was informed of the treatment options, risks and possible complications. To fulfill our ethical and legal obligations, as recommended by the American Medical Association's Code of Ethics, I have informed the patient of my clinical impression; the nature and purpose of the treatment or procedure; the risks, benefits, and possible complications of the intervention; the alternatives, including doing nothing; the risk(s) and benefit(s) of the alternative treatment(s) or procedure(s); and the risk(s) and benefit(s) of doing nothing. The patient was provided information about  the general risks and possible  complications associated with the procedure. These may include, but are not limited to: failure to achieve desired goals, infection, bleeding, organ or nerve damage, allergic reactions, paralysis, and death. In addition, the patient was informed of those risks and complications associated to Spine-related procedures, such as failure to decrease pain; infection (i.e.: Meningitis, epidural or intraspinal abscess); bleeding (i.e.: epidural hematoma, subarachnoid hemorrhage, or any other type of intraspinal or peri-dural bleeding); organ or nerve damage (i.e.: Any type of peripheral nerve, nerve root, or spinal cord injury) with subsequent damage to sensory, motor, and/or autonomic systems, resulting in permanent pain, numbness, and/or weakness of one or several areas of the body; allergic reactions; (i.e.: anaphylactic reaction); and/or death. Furthermore, the patient was informed of those risks and complications associated with the medications. These include, but are not limited to: allergic reactions (i.e.: anaphylactic or anaphylactoid reaction(s)); adrenal axis suppression; blood sugar elevation that in diabetics may result in ketoacidosis or comma; water retention that in patients with history of congestive heart failure may result in shortness of breath, pulmonary edema, and decompensation with resultant heart failure; weight gain; swelling or edema; medication-induced neural toxicity; particulate matter embolism and blood vessel occlusion with resultant organ, and/or nervous system infarction; and/or aseptic necrosis of one or more joints. Finally, the patient was informed that Medicine is not an exact science; therefore, there is also the possibility of unforeseen or unpredictable risks and/or possible complications that may result in a catastrophic outcome. The patient indicated having understood very clearly. We have given the patient no guarantees and we have made no  promises. Enough time was given to the patient to ask questions, all of which were answered to the patient's satisfaction. [name] has indicated that she wanted to continue with the procedure. Attestation: I, the ordering provider, attest that I have discussed with the patient the benefits, risks, side-effects, alternatives, likelihood of achieving goals, and potential problems during recovery for the procedure that I have provided informed consent. Date  Time: 04/10/2020 12:45 PM  Pre-Procedure Preparation:  Monitoring: As per clinic protocol. Respiration, ETCO2, SpO2, BP, heart rate and rhythm monitor placed and checked for adequate function Safety Precautions: Patient was assessed for positional comfort and pressure points before starting the procedure. Time-out: I initiated and conducted the "Time-out" before starting the procedure, as per protocol. The patient was asked to participate by confirming the accuracy of the "Time Out" information. Verification of the correct person, site, and procedure were performed and confirmed by me, the nursing staff, and the patient. "Time-out" conducted as per Joint Commission's Universal Protocol (UP.01.01.01). Time: 1336  Description of Procedure:          Target Area: The interlaminar space, initially targeting the lower laminar border of the superior vertebral body. Approach: Paramedial approach. Area Prepped: Entire Posterior Lumbar Region DuraPrep (Iodine Povacrylex [0.7% available iodine] and Isopropyl Alcohol, 74% w/w) Safety Precautions: Aspiration looking for blood return was conducted prior to all injections. At no point did we inject any substances, as a needle was being advanced. No attempts were made at seeking any paresthesias. Safe injection practices and needle disposal techniques used. Medications properly checked for expiration dates. SDV (single dose vial) medications used. Description of the Procedure: Protocol guidelines were followed. The  procedure needle was introduced through the skin, ipsilateral to the reported pain, and advanced to the target area. Bone was contacted and the needle walked caudad, until the lamina was cleared. The epidural space was identified using "loss-of-resistance technique" with 2-3 ml of  PF-NaCl (0.9% NSS), in a 5cc LOR glass syringe.  Vitals:   04/10/20 1242 04/10/20 1331 04/10/20 1336 04/10/20 1341  BP: 116/89 134/69 140/68 (!) 122/58  Pulse: 87     Resp: 18 16 18 16   Temp: 98.2 F (36.8 C)     TempSrc: Temporal     SpO2: 98% 99% 98% 98%  Weight: 160 lb (72.6 kg)     Height: 5\' 3"  (1.6 m)       Start Time: 1336 hrs. End Time: 1341 hrs.  Materials:  Needle(s) Type: Epidural needle Gauge: 17G Length: 3.5-in Medication(s): Please see orders for medications and dosing details.  Imaging Guidance (Spinal):          Type of Imaging Technique: Fluoroscopy Guidance (Spinal) Indication(s): Assistance in needle guidance and placement for procedures requiring needle placement in or near specific anatomical locations not easily accessible without such assistance. Exposure Time: Please see nurses notes. Contrast: Before injecting any contrast, we confirmed that the patient did not have an allergy to iodine, shellfish, or radiological contrast. Once satisfactory needle placement was completed at the desired level, radiological contrast was injected. Contrast injected under live fluoroscopy. No contrast complications. See chart for type and volume of contrast used. Fluoroscopic Guidance: I was personally present during the use of fluoroscopy. "Tunnel Vision Technique" used to obtain the best possible view of the target area. Parallax error corrected before commencing the procedure. "Direction-depth-direction" technique used to introduce the needle under continuous pulsed fluoroscopy. Once target was reached, antero-posterior, oblique, and lateral fluoroscopic projection used confirm needle placement in all  planes. Images permanently stored in EMR. Interpretation: I personally interpreted the imaging intraoperatively. Adequate needle placement confirmed in multiple planes. Appropriate spread of contrast into desired area was observed. No evidence of afferent or efferent intravascular uptake. No intrathecal or subarachnoid spread observed. Permanent images saved into the patient's record.  Antibiotic Prophylaxis:   Anti-infectives (From admission, onward)   None     Indication(s): None identified  Post-operative Assessment:  Post-procedure Vital Signs:  Pulse/HCG Rate: 8776 Temp: 98.2 F (36.8 C) Resp: 16 BP: (!) 122/58 SpO2: 98 %  EBL: None  Complications: No immediate post-treatment complications observed by team, or reported by patient.  Note: The patient tolerated the entire procedure well. A repeat set of vitals were taken after the procedure and the patient was kept under observation following institutional policy, for this type of procedure. Post-procedural neurological assessment was performed, showing return to baseline, prior to discharge. The patient was provided with post-procedure discharge instructions, including a section on how to identify potential problems. Should any problems arise concerning this procedure, the patient was given instructions to immediately contact , at any time, without hesitation. In any case, we plan to contact the patient by telephone for a follow-up status report regarding this interventional procedure.  Comments:  No additional relevant information.  Plan of Care  Orders:  Orders Placed This Encounter  Procedures  . Lumbar Epidural Injection    Scheduling Instructions:     Procedure: Interlaminar LESI L4-5     Laterality: Left-sided     Sedation: Patient's choice     Timeframe:  Today    Order Specific Question:   Where will this procedure be performed?    Answer:   [facility]  . DG PA[facility] C-ARM 1-60 MIN NO REPORT     Intraoperative interpretation by procedural physician at [facility].    Standing Status:   Standing  Number of Occurrences:   1    Order Specific Question:   Reason for exam:    Answer:   Assistance in needle guidance and placement for procedures requiring needle placement in or near specific anatomical locations not easily accessible without such assistance.  . Informed Consent Details: Physician/Practitioner Attestation; Transcribe to consent form and obtain patient signature    Provider Attestation: I, Jasalyn Frysinger A. Laban Emperor, MD, (Pain Management Specialist), the physician/practitioner, attest that I have discussed with the patient the benefits, risks, side effects, alternatives, likelihood of achieving goals and potential problems during recovery for the procedure that I have provided informed consent.    Scheduling Instructions:     Procedure: Lumbar epidural steroid injection under fluoroscopic guidance     Indications: Low back and/or lower extremity pain secondary to lumbar radiculitis     Note: Always confirm laterality of pain with Ms. Wivell, before procedure.     Transcribe to consent form and obtain patient signature.  . Provide equipment / supplies at bedside    Equipment required: Single use, disposable, "Epidural Tray" Epidural Catheter: NOT required    Standing Status:   Standing    Number of Occurrences:   1    Order Specific Question:   Specify    Answer:   Epidural Tray   Chronic Opioid Analgesic:  Oxycodone IR 5 mg, 1 tab PO BID (maximum of 10 mg/day of oxycodone.  On the average she uses 5 mg/day) MME/day:  7.5 mg/day.   Medications ordered for procedure: Meds ordered this encounter  Medications  . iohexol (OMNIPAQUE) 180 MG/ML injection 10 mL    Must be Myelogram-compatible. If not available, you may substitute with a water-soluble, non-ionic, hypoallergenic, myelogram-compatible radiological contrast medium.  Marland Kitchen lidocaine (XYLOCAINE) 2 % (with pres)  injection 400 mg  . sodium chloride flush (NS) 0.9 % injection 2 mL  . ropivacaine (PF) 2 mg/mL (0.2%) (NAROPIN) injection 2 mL  . triamcinolone acetonide (KENALOG-40) injection 40 mg   Medications administered: We administered iohexol, lidocaine, sodium chloride flush, ropivacaine (PF) 2 mg/mL (0.2%), and triamcinolone acetonide.  See the medical record for exact dosing, route, and time of administration.  Follow-up plan:   Return in about 2 weeks (around 04/24/2020) for (VV), (PP).       Interventional management options:  Considering: Possible bilateral lumbar facet RFA Possible bilateral sacroiliac joint RFA Diagnostic left L4 TFESI  Diagnostic bilateral IA hip joint injection Diagnostic bilateral femoral nerve + obturator NB Possible bilateral femoral nerve + obturator nerve RFA   Palliative PRN treatment(s): Palliative left L4-5LESI #6  Diagnostic right L4-5 LESI #2  Diagnostic right L5-S1 LESI #2  Diagnostic left L5-S1 LESI #3  Palliative left IA shoulder joint injection #2  Diagnosticleft AC joint injection + MNB (L) Biceps muscle #2  Palliative right SI joint block #3  Palliative trigger point injections      Recent Visits Date Type Provider Dept  04/04/20 Telemedicine Delano Metz, MD [facility]  Showing recent visits within past 90 days and meeting all other requirements   Today's Visits Date Type Provider Dept  04/10/20 Procedure visit Delano Metz, MD [facility]  Showing today's visits and meeting all other requirements   Future Appointments Date Type Provider Dept  04/25/20 Appointment Delano Metz, MD [facility]  07/02/20 Appointment Delano Metz, MD [facility]  Showing future appointments within next 90 days and meeting all other requirements   Disposition: Discharge home  Discharge (Date  Time):  04/10/2020; 1350 hrs.   Primary Care Physician: Jerl Mina, MD Location:  [facility] Note by: Oswaldo Done, MD Date: 04/10/2020; Time: 4:13 PM  Disclaimer:  Medicine is not an Visual merchandiser. The only guarantee in medicine is that nothing is guaranteed. It is important to note that the decision to proceed with this intervention was based on the information collected from the patient. The Data and conclusions were drawn from the patient's questionnaire, the interview, and the physical examination. Because the information was provided in large part by the patient, it cannot be guaranteed that it has not been purposely or unconsciously manipulated. Every effort has been made to obtain as much relevant data as possible for this evaluation. It is important to note that the conclusions that lead to this procedure are derived in large part from the available data. Always take into account that the treatment will also be dependent on availability of resources and existing treatment guidelines, considered by other Pain Management Practitioners as being common knowledge and practice, at the time of the intervention. For Medico-Legal purposes, it is also important to point out that variation in procedural techniques and pharmacological choices are the acceptable norm. The indications, contraindications, technique, and results of the above procedure should only be interpreted and judged by a Board-Certified Interventional Pain Specialist with extensive familiarity and expertise in the same exact procedure and technique.

## 2020-04-11 ENCOUNTER — Telehealth: Payer: Self-pay | Admitting: *Deleted

## 2020-04-11 NOTE — Telephone Encounter (Signed)
No problems post procedure. 

## 2020-04-17 LAB — TOXASSURE SELECT 13 (MW), URINE

## 2020-04-19 ENCOUNTER — Telehealth: Payer: Self-pay | Admitting: Pain Medicine

## 2020-04-19 NOTE — Telephone Encounter (Signed)
Patient's daughter called stating Cynthia Bennett had a procedure done and it seems to be helping, but now she is having severe hip pain and would like someone to call her asap please

## 2020-04-19 NOTE — Telephone Encounter (Signed)
Returned call to Zella Ball, message left, instructed patient to call our office to schedule appt

## 2020-04-24 ENCOUNTER — Other Ambulatory Visit: Payer: Self-pay

## 2020-04-24 ENCOUNTER — Encounter: Payer: Self-pay | Admitting: Pain Medicine

## 2020-04-24 ENCOUNTER — Ambulatory Visit (HOSPITAL_BASED_OUTPATIENT_CLINIC_OR_DEPARTMENT_OTHER): Payer: PPO | Admitting: Pain Medicine

## 2020-04-24 ENCOUNTER — Ambulatory Visit
Admission: RE | Admit: 2020-04-24 | Discharge: 2020-04-24 | Disposition: A | Discharge status: Home / Self Care | Payer: PPO | Source: Ambulatory Visit | Attending: Pain Medicine | Admitting: Pain Medicine

## 2020-04-24 VITALS — BP 115/97 | HR 89 | Temp 97.2°F | Resp 29 | Ht 62.0 in | Wt 160.0 lb

## 2020-04-24 DIAGNOSIS — M25551 Pain in right hip: Secondary | ICD-10-CM

## 2020-04-24 DIAGNOSIS — G894 Chronic pain syndrome: Secondary | ICD-10-CM

## 2020-04-24 DIAGNOSIS — G8929 Other chronic pain: Secondary | ICD-10-CM | POA: Diagnosis not present

## 2020-04-24 DIAGNOSIS — M160 Bilateral primary osteoarthritis of hip: Secondary | ICD-10-CM | POA: Insufficient documentation

## 2020-04-24 DIAGNOSIS — M1611 Unilateral primary osteoarthritis, right hip: Secondary | ICD-10-CM | POA: Insufficient documentation

## 2020-04-24 MED ORDER — METHYLPREDNISOLONE ACETATE 80 MG/ML IJ SUSP
INTRAMUSCULAR | Status: AC
  Filled 2020-04-24: qty 1

## 2020-04-24 MED ORDER — ROPIVACAINE HCL 2 MG/ML IJ SOLN
INTRAMUSCULAR | Status: AC
  Filled 2020-04-24: qty 10

## 2020-04-24 MED ORDER — IOHEXOL 180 MG/ML  SOLN
10.0000 mL | Freq: Once | INTRAMUSCULAR | Status: AC
  Administered 2020-04-24: 10 mL via EPIDURAL
  Filled 2020-04-24: qty 20

## 2020-04-24 MED ORDER — LIDOCAINE HCL 2 % IJ SOLN
20.0000 mL | Freq: Once | INTRAMUSCULAR | Status: AC
  Administered 2020-04-24: 400 mg

## 2020-04-24 MED ORDER — ROPIVACAINE HCL 2 MG/ML IJ SOLN
9.0000 mL | Freq: Once | INTRAMUSCULAR | Status: AC
  Administered 2020-04-24: 9 mL via INTRA_ARTICULAR

## 2020-04-24 MED ORDER — LIDOCAINE HCL 2 % IJ SOLN
INTRAMUSCULAR | Status: AC
  Filled 2020-04-24: qty 20

## 2020-04-24 MED ORDER — METHYLPREDNISOLONE ACETATE 80 MG/ML IJ SUSP
80.0000 mg | Freq: Once | INTRAMUSCULAR | Status: AC
  Administered 2020-04-24: 80 mg via INTRA_ARTICULAR

## 2020-04-24 NOTE — Progress Notes (Signed)
PROVIDER NOTE: Information contained herein reflects review and annotations entered in association with encounter. Interpretation of such information and data should be left to medically-trained personnel. Information provided to patient can be located elsewhere in the medical record under "Patient Instructions". Document created using STT-dictation technology, any transcriptional errors that may result from process are unintentional.    Patient: Cynthia Bennett  Service Category: Procedure  Provider: Oswaldo Done, MD  DOB: 1933/08/15  DOS: 04/24/2020  Location: [facility]  MRN: [id number]  Setting: Ambulatory - outpatient  Referring Provider: Jerl Mina, MD  Type: Established Patient  Specialty: Interventional Pain Management  PCP: Cynthia Mina, MD   Primary Reason for Visit: Interventional Pain Management Treatment. CC: Hip Pain (right)  Procedure:          Anesthesia, Analgesia, Anxiolysis:  Type: Intra-Articular Hip, trochanteric bursa, ischiofemoral ligament, and piriformis muscle injection #1  Primary Purpose: Diagnostic/Therapeutric Region: Posterolateral hip joint area. Level: Lower pelvic and hip joint level. Target Area: Superior aspect of the hip joint cavity, going thru the superior portion of the capsular ligament. Approach: Posterolateral approach. Laterality: Right  Type: Local Anesthesia Indication(s): Analgesia         Route: Infiltration ([place]/IM) IV Access: Declined Sedation: Declined  Local Anesthetic: Lidocaine 1-2%  Position: Lateral Decubitus with bad side up Prepped Area: Entire Posterolateral hip area. DuraPrep (Iodine Povacrylex [0.7% available iodine] and Isopropyl Alcohol, 74% w/w)   Indications: 1. Chronic hip pain (Right)   2. Osteoarthritis of hip (Right)   3. Chronic pain syndrome    Pain Score: Pre-procedure: 10-Worst pain ever/10 Post-procedure: 10-Worst pain ever (unable to bare weight on right leg and hip. f/up tomorrow   )/10   Patient initially came into the clinics today for a follow-up evaluation after her left-sided lumbar epidural steroid injection.  This provided the patient with excellent relief of the pain on the left lower extremity taking care of her radiculitis.  Unfortunately, the patient refers that she is not sure what she did at home, but between that last procedure and now she has developed an acute on chronic right hip pain.  Physical exam today was positive for hip pain on figure 4 maneuver.  The pain seems to be more in the posterior aspect of the hip joint in the gluteal area.  She had no tenderness to palpation over the ischial tuberosity or the trochanteric area.  The patient has requested that we do an injection in the area today since she is unable to put any weight at all on that hip.  Review of the available imaging shows that on 07/20/2018 she had some x-rays of the right hip due to pain that she was experiencing on that hip at that time.  The x-rays demonstrated that she had some minimal degenerative changes, but this appears to have changed.  Because the patient does receive treatments with steroids, there is always the possibility that she could have developed an aseptic necrosis of the hip and therefore I will be ordering some new x-rays of that hip.  This pain seems to be in the area of the ischiofemoral ligament.  Post-Procedure Evaluation  Procedure: Palliative left L4-5 LESI #5 under fluoroscopic guidance, no sedation Pre-procedure pain level: 10/10 Post-procedure: 5/10 50% relief  Sedation: None.  Effectiveness during initial hour after procedure(Ultra-Short Term Relief): 100 %.  Local anesthetic used: Long-acting (4-6 hours) Effectiveness: Defined as any analgesic benefit obtained secondary to the administration of local anesthetics. This carries significant diagnostic  value as to the etiological location, or anatomical origin, of the pain. Duration of benefit is expected to coincide  with the duration of the local anesthetic used.  Effectiveness during initial 4-6 hours after procedure(Short-Term Relief): 100 %.  Long-term benefit: Defined as any relief past the pharmacologic duration of the local anesthetics.  Effectiveness past the initial 6 hours after procedure(Long-Term Relief): 90 %.  Current benefits: Defined as benefit that persist at this time.   Analgesia:  90-100% better Function: Ms. Cynthia SquibbWall reports improvement in function ROM: Ms. Cynthia SquibbWall reports improvement in ROM  Pre-op Assessment:  Ms. Cynthia SquibbWall is a 86 y.o. (year old), female patient, seen today for interventional treatment. She  has a past surgical history that includes Esophagogastroduodenoscopy (N/A, 04/16/2015); Appendectomy; and Abdominal hysterectomy. Ms. Cynthia SquibbWall has a current medication list which includes the following prescription(s): acetaminophen, bupropion, vitamin d3, doxepin, esomeprazole, [facility] slow release iron, multivitamin, oxycodone, [START ON 05/04/2020] oxycodone, [START ON 06/03/2020] oxycodone, pantoprazole, sertraline, zolpidem, and cyanocobalamin. Her primarily concern today is the Hip Pain (right)  Initial Vital Signs:  Pulse/HCG Rate: 89ECG Heart Rate: 84 Temp: (!) 97.2 F (36.2 C) Resp: 18 BP: 127/81 SpO2: 91 %  BMI: Estimated body mass index is 29.26 kg/m as calculated from the following:   Height as of this encounter: 5\' 2"  (1.575 m).   Weight as of this encounter: 160 lb (72.6 kg).  Risk Assessment: Allergies: Reviewed. She is allergic to ibuprofen.  Allergy Precautions: None required Coagulopathies: Reviewed. None identified.  Blood-thinner therapy: None at this time Active Infection(s): Reviewed. None identified. Ms. Cynthia SquibbWall is afebrile  Site Confirmation: Ms. Cynthia SquibbWall was asked to confirm the procedure and laterality before marking the site Procedure checklist: Completed Consent: Before the procedure and under the influence of no sedative(s), amnesic(s), or anxiolytics, the patient was  informed of the treatment options, risks and possible complications. To fulfill our ethical and legal obligations, as recommended by the American Medical Association's Code of Ethics, I have informed the patient of my clinical impression; the nature and purpose of the treatment or procedure; the risks, benefits, and possible complications of the intervention; the alternatives, including doing nothing; the risk(s) and benefit(s) of the alternative treatment(s) or procedure(s); and the risk(s) and benefit(s) of doing nothing. The patient was provided information about the general risks and possible complications associated with the procedure. These may include, but are not limited to: failure to achieve desired goals, infection, bleeding, organ or nerve damage, allergic reactions, paralysis, and death. In addition, the patient was informed of those risks and complications associated to the procedure, such as failure to decrease pain; infection; bleeding; organ or nerve damage with subsequent damage to sensory, motor, and/or autonomic systems, resulting in permanent pain, numbness, and/or weakness of one or several areas of the body; allergic reactions; (i.e.: anaphylactic reaction); and/or death. Furthermore, the patient was informed of those risks and complications associated with the medications. These include, but are not limited to: allergic reactions (i.e.: anaphylactic or anaphylactoid reaction(s)); adrenal axis suppression; blood sugar elevation that in diabetics may result in ketoacidosis or comma; water retention that in patients with history of congestive heart failure may result in shortness of breath, pulmonary edema, and decompensation with resultant heart failure; weight gain; swelling or edema; medication-induced neural toxicity; particulate matter embolism and blood vessel occlusion with resultant organ, and/or nervous system infarction; and/or aseptic necrosis of one or more joints. Finally, the  patient was informed that Medicine is not an exact science; therefore, there is also  the possibility of unforeseen or unpredictable risks and/or possible complications that may result in a catastrophic outcome. The patient indicated having understood very clearly. We have given the patient no guarantees and we have made no promises. Enough time was given to the patient to ask questions, all of which were answered to the patient's satisfaction. Ms. Fiebig has indicated that she wanted to continue with the procedure. Attestation: I, the ordering provider, attest that I have discussed with the patient the benefits, risks, side-effects, alternatives, likelihood of achieving goals, and potential problems during recovery for the procedure that I have provided informed consent. Date  Time: 04/24/2020 11:10 AM  Pre-Procedure Preparation:  Monitoring: As per clinic protocol. Respiration, ETCO2, SpO2, BP, heart rate and rhythm monitor placed and checked for adequate function Safety Precautions: Patient was assessed for positional comfort and pressure points before starting the procedure. Time-out: I initiated and conducted the "Time-out" before starting the procedure, as per protocol. The patient was asked to participate by confirming the accuracy of the "Time Out" information. Verification of the correct person, site, and procedure were performed and confirmed by me, the nursing staff, and the patient. "Time-out" conducted as per Joint Commission's Universal Protocol (UP.01.01.01). Time: 1209  Description of Procedure:          Safety Precautions: Aspiration looking for blood return was conducted prior to all injections. At no point did we inject any substances, as a needle was being advanced. No attempts were made at seeking any paresthesias. Safe injection practices and needle disposal techniques used. Medications properly checked for expiration dates. SDV (single dose vial) medications used. Description of the  Procedure: Protocol guidelines were followed. The patient was placed in position over the fluoroscopy table. The target area was identified and the area prepped in the usual manner. Skin & deeper tissues infiltrated with local anesthetic. Appropriate amount of time allowed to pass for local anesthetics to take effect. The procedure needles were then advanced to the target area. Proper needle placement secured. Negative aspiration confirmed. Solution injected in intermittent fashion, asking for systemic symptoms every 0.5cc of injectate. The needles were then removed and the area cleansed, making sure to leave some of the prepping solution back to take advantage of its long term bactericidal properties.  Vitals:   04/24/20 1211 04/24/20 1216 04/24/20 1219 04/24/20 1222  BP: (!) 149/63 (!) 137/59 131/83 (!) 115/97  Pulse:      Resp: (!) (!) 29  Temp:      TempSrc:      SpO2: 100% 99% 100% 97%  Weight:      Height:        Start Time: 1209 hrs. End Time: 1221 hrs. Materials:  Needle(s) Type: Spinal Needle Gauge: 22G Length: 5.0-in Medication(s): Please see orders for medications and dosing details.  Imaging Guidance (Non-Spinal):          Type of Imaging Technique: Fluoroscopy Guidance (Non-Spinal) Indication(s): Assistance in needle guidance and placement for procedures requiring needle placement in or near specific anatomical locations not easily accessible without such assistance. Exposure Time: Please see nurses notes. Contrast: Before injecting any contrast, we confirmed that the patient did not have an allergy to iodine, shellfish, or radiological contrast. Once satisfactory needle placement was completed at the desired level, radiological contrast was injected. Contrast injected under live fluoroscopy. No contrast complications. See chart for type and volume of contrast used. Fluoroscopic Guidance: I was personally present during the use of fluoroscopy. "Tunnel Vision Technique"  used to obtain the best possible view of the target area. Parallax error corrected before commencing the procedure. "Direction-depth-direction" technique used to introduce the needle under continuous pulsed fluoroscopy. Once target was reached, antero-posterior, oblique, and lateral fluoroscopic projection used confirm needle placement in all planes. Images permanently stored in EMR. Interpretation: I personally interpreted the imaging intraoperatively. Adequate needle placement confirmed in multiple planes. Appropriate spread of contrast into desired area was observed. No evidence of afferent or efferent intravascular uptake. Permanent images saved into the patient's record.  Antibiotic Prophylaxis:   Anti-infectives (From admission, onward)   None     Indication(s): None identified  Post-operative Assessment:  Post-procedure Vital Signs:  Pulse/HCG Rate: 8981 Temp: (!) 97.2 F (36.2 C) Resp: (!) 29 BP: (!) 115/97 SpO2: 97 %  EBL: None  Complications: No immediate post-treatment complications observed by team, or reported by patient.  Note: The patient tolerated the entire procedure well. A repeat set of vitals were taken after the procedure and the patient was kept under observation following institutional policy, for this type of procedure. Post-procedural neurological assessment was performed, showing return to baseline, prior to discharge. The patient was provided with post-procedure discharge instructions, including a section on how to identify potential problems. Should any problems arise concerning this procedure, the patient was given instructions to immediately contact us, at any time, without hesitation. In any case, we plan to contact the patient by telephone for a follow-up status report regarding this interventional procedure.  Comments:  No additional relevant information.  Plan of Care  Orders:  Orders Placed This Encounter  Procedures  . HIP INJECTION    Scheduling  Instructions:     Side: Right-sided     Sedation: No Sedation.     Timeframe: Today  . DG PAIN CLINIC C-ARM 1-60 MIN NO REPORT    Intraoperative interpretation by procedural physician at [facility].    Standing Status:   Standing    Number of Occurrences:   1    Order Specific Question:   Reason for exam:    Answer:   Assistance in needle guidance and placement for procedures requiring needle placement in or near specific anatomical locations not easily accessible without such assistance.  . DG HIP UNILAT W OR W/O PELVIS 2-3 VIEWS RIGHT    Standing Status:   Future    Standing Expiration Date:   04/24/2021    Scheduling Instructions:     Please describe any evidence of DJD, such as joint narrowing, asymmetry, cysts, or any anomalies in bone density, production, or erosion.    Order Specific Question:   Reason for Exam (SYMPTOM  OR DIAGNOSIS REQUIRED)    Answer:   Right hip pain/arthralgia    Order Specific Question:   Preferred imaging location?    Answer:   [facility]    Order Specific Question:   Call Results- Best Contact Number?    Answer:   [phone] (Pain Clinic facility) (Dr. Laban Emperor)  . Informed Consent Details: Physician/Practitioner Attestation; Transcribe to consent form and obtain patient signature    Nursing Order: Transcribe to consent form and obtain patient signature. Note: Always confirm laterality of pain with Ms. Dutkiewicz, before procedure. Procedure: Hip injection Indication/Reason: Hip Joint Pain (Arthralgia) Provider Attestation: I, Giovani Neumeister A. Laban Emperor, MD, (Pain Management Specialist), the physician/practitioner, attest that I have discussed with the patient the benefits, risks, side effects, alternatives, likelihood of achieving goals and potential problems during recovery for the procedure that I have provided  informed consent.  . Provide equipment / supplies at bedside    Equipment required: Single use, disposable, "Block Tray"    Standing  Status:   Standing    Number of Occurrences:   1    Order Specific Question:   Specify    Answer:   Block Tray   Chronic Opioid Analgesic:  Oxycodone IR 5 mg, 1 tab PO BID (maximum of 10 mg/day of oxycodone.  On the average she uses 5 mg/day) MME/day:  7.5 mg/day.   Medications ordered for procedure: Meds ordered this encounter  Medications  . iohexol (OMNIPAQUE) 180 MG/ML injection 10 mL    Must be Myelogram-compatible. If not available, you may substitute with a water-soluble, non-ionic, hypoallergenic, myelogram-compatible radiological contrast medium.  Marland Kitchen lidocaine (XYLOCAINE) 2 % (with pres) injection 400 mg  . ropivacaine (PF) 2 mg/mL (0.2%) (NAROPIN) injection 9 mL  . methylPREDNISolone acetate (DEPO-MEDROL) injection 80 mg   Medications administered: We administered iohexol, lidocaine, ropivacaine (PF) 2 mg/mL (0.2%), and methylPREDNISolone acetate.  See the medical record for exact dosing, route, and time of administration.  Follow-up plan:   Return in about 2 weeks (around 05/08/2020) for (VV), (PP).       Interventional management options:  Considering: Possible bilateral lumbar facet RFA Possible bilateral sacroiliac joint RFA Diagnostic left L4 TFESI  Diagnostic bilateral IA hip joint injection Diagnostic bilateral femoral nerve + obturator NB Possible bilateral femoral nerve + obturator nerve RFA   Palliative PRN treatment(s): Palliative left L4-5LESI #6  Diagnostic right L4-5 LESI #2  Diagnostic right L5-S1 LESI #2  Diagnostic left L5-S1 LESI #3  Palliative left IA shoulder joint injection #2  Diagnosticleft AC joint injection + MNB (L) Biceps muscle #2  Palliative right SI joint block #3  Palliative trigger point injections       Recent Visits Date Type Provider Dept  04/10/20 Procedure visit Milinda Pointer, MD [facility]  04/04/20 Telemedicine Milinda Pointer, MD [facility]  Showing recent visits within past 90  days and meeting all other requirements Today's Visits Date Type Provider Dept  04/24/20 Office Visit Milinda Pointer, MD [facility]  Showing today's visits and meeting all other requirements Future Appointments Date Type Provider Dept  05/08/20 Appointment Milinda Pointer, MD [facility]  07/02/20 Appointment Milinda Pointer, MD [facility]  Showing future appointments within next 90 days and meeting all other requirements  Disposition: Discharge home  Discharge (Date  Time): 04/24/2020; 1239 hrs.   Primary Care Physician: [name], MD Location: [facility] Note by: Gaspar Cola, MD Date: 04/24/2020; Time: 12:39 PM  Disclaimer:  Medicine is not an Chief Strategy Officer. The only guarantee in medicine is that nothing is guaranteed. It is important to note that the decision to proceed with this intervention was based on the information collected from the patient. The Data and conclusions were drawn from the patient's questionnaire, the interview, and the physical examination. Because the information was provided in large part by the patient, it cannot be guaranteed that it has not been purposely or unconsciously manipulated. Every effort has been made to obtain as much relevant data as possible for this evaluation. It is important to note that the conclusions that lead to this procedure are derived in large part from the available data. Always take into account that the treatment will also be dependent on availability of resources and existing treatment guidelines, considered by other Pain Management Practitioners as being common knowledge  and practice, at the time of the intervention. For Medico-Legal purposes, it is also important to point out that variation in procedural techniques and pharmacological choices are the acceptable norm. The indications, contraindications, technique, and results of the above procedure should  only be interpreted and judged by a Board-Certified Interventional Pain Specialist with extensive familiarity and expertise in the same exact procedure and technique.

## 2020-04-24 NOTE — Progress Notes (Signed)
Safety precautions to be maintained throughout the outpatient stay will include: orient to surroundings, keep bed in low position, maintain call bell within reach at all times, provide assistance with transfer out of bed and ambulation.  

## 2020-04-24 NOTE — Patient Instructions (Signed)

## 2020-04-25 ENCOUNTER — Telehealth: Payer: PPO | Admitting: Pain Medicine

## 2020-04-25 ENCOUNTER — Telehealth: Payer: Self-pay | Admitting: *Deleted

## 2020-04-25 NOTE — Telephone Encounter (Signed)
No problems post procedure. 

## 2020-05-07 ENCOUNTER — Encounter: Payer: Self-pay | Admitting: Pain Medicine

## 2020-05-08 ENCOUNTER — Ambulatory Visit: Payer: PPO | Attending: Pain Medicine | Admitting: Pain Medicine

## 2020-05-08 ENCOUNTER — Telehealth: Payer: Self-pay | Admitting: *Deleted

## 2020-05-08 ENCOUNTER — Other Ambulatory Visit: Payer: Self-pay

## 2020-05-08 DIAGNOSIS — M25551 Pain in right hip: Secondary | ICD-10-CM

## 2020-05-08 DIAGNOSIS — G894 Chronic pain syndrome: Secondary | ICD-10-CM

## 2020-05-08 DIAGNOSIS — M25552 Pain in left hip: Secondary | ICD-10-CM

## 2020-05-08 DIAGNOSIS — G8929 Other chronic pain: Secondary | ICD-10-CM

## 2020-05-08 DIAGNOSIS — M545 Low back pain: Secondary | ICD-10-CM

## 2020-05-08 DIAGNOSIS — M79605 Pain in left leg: Secondary | ICD-10-CM

## 2020-05-08 DIAGNOSIS — Z79899 Other long term (current) drug therapy: Secondary | ICD-10-CM

## 2020-05-08 MED ORDER — OXYCODONE HCL 5 MG PO TABS: 5.0000 mg | ORAL_TABLET | Freq: Two times a day (BID) | ORAL | 0 refills | Status: DC | PRN

## 2020-05-08 NOTE — Telephone Encounter (Signed)
Called to [facility] and spoke with Shawn.  I asked him to cancel oxycodone 5 mg qty of 30 and that oxycodone 5 mg qty 60 had been sent in x 2 months.  He states that the  Qty 30 was picked up on June 24th.  States she will be able to pick up the qty 60 when she runs out of those.

## 2020-05-08 NOTE — Progress Notes (Signed)
Patient: Cynthia Bennett  Service Category: E/M  Provider: Gaspar Cola, MD  DOB: 10/07/1933  DOS: 05/08/2020  Location: Office  MRN: [id number]  Setting: Ambulatory outpatient  Referring Provider: [name], MD  Type: Established Patient  Specialty: Interventional Pain Management  PCP: [name], MD  Location: Remote location  Delivery: TeleHealth     Virtual Encounter - Pain Management PROVIDER NOTE: Information contained herein reflects review and annotations entered in association with encounter. Interpretation of such information and data should be left to medically-trained personnel. Information provided to patient can be located elsewhere in the medical record under "Patient Instructions". Document created using STT-dictation technology, any transcriptional errors that may result from process are unintentional.    Contact & Pharmacy Preferred: [place]: [phone] (home) Mobile: There is no such number on file (mobile). E-mail: [email or web address]  [id number] [address] [id number]: [phone]: [phone]  Pre-screening  Cynthia Bennett offered "in-person" vs "virtual" encounter. She indicated preferring virtual for this encounter.   Reason COVID-19*  Social distancing based on CDC and AMA recommendations.   I contacted FFinis Budon 05/08/2020 via telephone.      I clearly identified myself as FGaspar Cola MD. I verified that I was speaking with the correct person using two identifiers (Name: Cynthia Bennett and date of birth: [birth year].  Consent I sought verbal advanced consent from FFinis Budfor virtual visit interactions. I informed Cynthia Bennett of possible security and privacy concerns, risks, and limitations associated with providing "not-in-person" medical evaluation and management services. I also informed Cynthia Bennett of the availability of "in-person" appointments. Finally, I informed  her that there would be a charge for the virtual visit and that she could be  personally, fully or partially, financially responsible for it. Ms. WPeeksexpressed understanding and agreed to proceed.   Historic Elements   Ms. FNAPHTALI RIEDEis a 86y.o. year old, female patient evaluated today after her last contact with our practice on 04/25/2020. Cynthia Bennett has a past medical history of Dizzy spells, Fibromyalgia, Glaucoma, Hiatal hernia, Osteoarthritis, Osteoarthritis of spine with radiculopathy, lumbar region (08/14/2015), Peptic ulcer disease, Reflux, Spinal stenosis, [place], and TIA (transient ischemic attack). She also  has a past surgical history that includes Esophagogastroduodenoscopy (N/A, 04/16/2015); Appendectomy; and Abdominal hysterectomy. Ms. WDepintohas a current medication list which includes the following prescription(s): acetaminophen, bupropion, vitamin d3, cyanocobalamin, doxepin, esomeprazole, [facility]vs slow release iron, multivitamin, oxycodone, [START ON 06/07/2020] oxycodone, pantoprazole, sertraline, and zolpidem. She  reports that she has quit smoking. She has never used smokeless tobacco. She reports that she does not drink alcohol and does not use drugs. Ms. WPainis allergic to ibuprofen.   HPI  Today, she is being contacted for a post-procedure assessment. The patient indicates doing well with the current medication regimen. No adverse reactions or side effects reported to the medications.  The patient indicates still having some pain which she described to be in the bone area of the buttocks were it is in contact with a chair.  Basically she is referring to the ischial tuberosity.  From her description it seems that she may still be having some ischial bursitis.  I talked to the patient about this and she indicates that it may be secondary to the fact that lately she has been having more problems walking #4 she has been sitting more.  She believes  that this may have something to do with that  pain.  Post-Procedure Evaluation  Procedure: Therapeutic right IA hip joint injection, trochanteric bursa, ischiofemoral ligament, and piriformis muscle injection #1 under fluoroscopic guidance, no IV sedation. Pre-procedure pain level: 10/10 Post-procedure: 10/10 No initial benefit, possibly due to rapid discharge after no sedation procedure, without enough time to allow full onset of block.  Sedation: None.  Effectiveness during initial hour after procedure(Ultra-Short Term Relief): 100 %.  Local anesthetic used: Long-acting (4-6 hours) Effectiveness: Defined as any analgesic benefit obtained secondary to the administration of local anesthetics. This carries significant diagnostic value as to the etiological location, or anatomical origin, of the pain. Duration of benefit is expected to coincide with the duration of the local anesthetic used.  Effectiveness during initial 4-6 hours after procedure(Short-Term Relief): 100 %.  Long-term benefit: Defined as any relief past the pharmacologic duration of the local anesthetics.  Effectiveness past the initial 6 hours after procedure(Long-Term Relief): 50 %.  Current benefits: Defined as benefit that persist at this time.   Analgesia:  >75% relief Function: Somewhat improved ROM: Somewhat improved  Pharmacotherapy Assessment  Analgesic: Oxycodone IR 5 mg, 1 tab PO BID (maximum of 10 mg/day of oxycodone.  On the average she uses 5 mg/day) MME/day:  7.5 mg/day.   Monitoring: [place] PMP: PDMP reviewed during this encounter.       Pharmacotherapy: No side-effects or adverse reactions reported. Compliance: No problems identified. Effectiveness: Clinically acceptable. Plan: Refer to "POC".  UDS:  Summary  Date Value Ref Range Status  04/10/2020 Note  Final    Comment:    ==================================================================== ToxASSURE Select 13 (MW) ==================================================================== Test                              Result       Flag       Units Drug Present and Declared for Prescription Verification   Oxycodone                      919          EXPECTED   ng/mg creat   Oxymorphone                    57           EXPECTED   ng/mg creat   Noroxycodone                   3286         EXPECTED   ng/mg creat    Sources of oxycodone include scheduled prescription medications.    Oxymorphone and noroxycodone are expected metabolites of oxycodone.    Oxymorphone is also available as a scheduled prescription medication. ==================================================================== Test                      Result    Flag   Units      Ref Range   Creatinine              168              mg/dL      >=20 ==================================================================== Declared Medications:  The flagging and interpretation on this report are based on the  following declared medications.  Unexpected results may arise from  inaccuracies in the declared medications.  **Note: The testing scope of this panel includes these medications:  Oxycodone (  Roxicodone)  **Note: The testing scope of this panel does not include the  following reported medications:  Acetaminophen (Tylenol)  Bupropion (Wellbutrin SR)  Dorzolamide  Doxepin  Esomeprazole (Nexium)  Iron  Multivitamin  Pantoprazole (Protonix)  Sertraline (Zoloft)  Timolol  Vitamin D3  Zolpidem (Ambien) ==================================================================== For clinical consultation, please call [phone]. ====================================================================     Laboratory Chemistry Profile   Renal Lab Results  Component Value Date   BUN 14 01/10/2019   CREATININE 1.26 (H) 01/10/2019   BCR 11 (L) 01/10/2019   GFRAA 46 (L) 01/10/2019   GFRNONAA 39 (L) 01/10/2019     Hepatic Lab Results  Component Value Date   AST 18 01/10/2019   ALT 11 (L) 06/24/2016   ALBUMIN 4.3 01/10/2019    ALKPHOS 74 01/10/2019   LIPASE 139 04/27/2014     Electrolytes Lab Results  Component Value Date   NA 141 01/10/2019   K 4.7 01/10/2019   CL 104 01/10/2019   CALCIUM 9.5 01/10/2019   MG 2.3 01/10/2019     Bone Lab Results  Component Value Date   25OHVITD1 26 (L) 01/10/2019   25OHVITD2 1.6 01/10/2019   25OHVITD3 24 01/10/2019     Inflammation (CRP: Acute Phase) (ESR: Chronic Phase) Lab Results  Component Value Date   CRP 2 01/10/2019   ESRSEDRATE 51 (H) 01/10/2019       Note: Above Lab results reviewed.   Imaging  DG PAIN CLINIC C-ARM 1-60 MIN NO REPORT Fluoro was used, but no Radiologist interpretation will be provided.  Please refer to "NOTES" tab for provider progress note.  Assessment  The primary encounter diagnosis was Chronic pain syndrome. Diagnoses of Chronic low back pain (1ry area of Pain) (Bilateral) (L>R), Chronic lower extremity pain (2ry area of Pain) (Left), Chronic hip pain (3ry area of Pain) (Bilateral) (R>L), and Pharmacologic therapy were also pertinent to this visit.  Plan of Care  Problem-specific:  No problem-specific Assessment & Plan notes found for this encounter.  Ms. JAHARI WIGINTON has a current medication list which includes the following long-term medication(s): bupropion, doxepin, esomeprazole, cvs slow release iron, oxycodone, [START ON 06/07/2020] oxycodone, pantoprazole, and zolpidem.  Pharmacotherapy (Medications Ordered): Meds ordered this encounter  Medications  . oxyCODONE (OXY IR/ROXICODONE) 5 MG immediate release tablet    Sig: Take 1 tablet (5 mg total) by mouth every 12 (twelve) hours as needed for severe pain. Must last 30 days    Dispense:  60 tablet    Refill:  0    Chronic Pain: STOP Act (Not applicable) Fill 1 day early if closed on refill date. Do not fill until: 05/08/2020. To last until: 06/07/2020. Avoid benzodiazepines within 8 hours of opioids  . oxyCODONE (OXY IR/ROXICODONE) 5 MG immediate release tablet    Sig: Take  1 tablet (5 mg total) by mouth every 12 (twelve) hours as needed for severe pain. Must last 30 days    Dispense:  60 tablet    Refill:  0    Chronic Pain: STOP Act (Not applicable) Fill 1 day early if closed on refill date. Do not fill until: 06/07/2020. To last until: 07/07/2020. Avoid benzodiazepines within 8 hours of opioids   Orders:  No orders of the defined types were placed in this encounter.  Follow-up plan:   Return for regular appointment, (F2F), (MM), to evaluate results of hip x-ray.      Interventional management options:  Considering: Diagnostic right ischial bursa injection #1  Possible bilateral lumbar  facet RFA Possible bilateral sacroiliac joint RFA Diagnostic left L4 TFESI  Diagnostic left IA hip joint injection  Diagnostic bilateral femoral nerve + obturator NB Possible bilateral femoral nerve + obturator nerve RFA   Palliative PRN treatment(s): Palliative left L4-5LESI #6  Diagnostic right L4-5 LESI #2  Diagnostic right L5-S1 LESI #2  Diagnostic left L5-S1 LESI #3  Palliative left IA shoulder joint injection #2  Diagnosticleft AC joint injection + MNB (L) Biceps muscle #2  Palliative right SI joint block #3  Therapeutic right trochanteric bursa injection #2  Therapeutic right ischiofemoral ligament injection #2  Therapeutic right piriformis muscle trigger point injection #2 Therapeutic right IA hip joint injection #2     Recent Visits Date Type Provider Dept  04/24/20 Office Visit Milinda Pointer, MD [facility]  04/10/20 Procedure visit Milinda Pointer, MD [facility]  04/04/20 Telemedicine Milinda Pointer, MD [facility]  Showing recent visits within past 90 days and meeting all other requirements Today's Visits Date Type Provider Dept  05/08/20 Telemedicine Milinda Pointer, MD [facility]  Showing today's visits and meeting all other requirements Future Appointments Date Type Provider  Dept  07/02/20 Appointment Milinda Pointer, MD [facility]  Showing future appointments within next 90 days and meeting all other requirements  I discussed the assessment and treatment plan with the patient. The patient was provided an opportunity to ask questions and all were answered. The patient agreed with the plan and demonstrated an understanding of the instructions.  Patient advised to call back or seek an in-person evaluation if the symptoms or condition worsens.  Duration of encounter: 19 minutes.  Note by: Gaspar Cola, MD Date: 05/08/2020; Time: 12:37 PM

## 2020-06-29 ENCOUNTER — Encounter: Payer: Self-pay | Admitting: Student in an Organized Health Care Education/Training Program

## 2020-07-02 ENCOUNTER — Other Ambulatory Visit: Payer: Self-pay

## 2020-07-02 ENCOUNTER — Ambulatory Visit: Payer: PPO | Admitting: Pain Medicine

## 2020-07-02 ENCOUNTER — Encounter: Payer: Self-pay | Admitting: Student in an Organized Health Care Education/Training Program

## 2020-07-02 ENCOUNTER — Ambulatory Visit: Payer: PPO | Attending: Pain Medicine | Admitting: Student in an Organized Health Care Education/Training Program

## 2020-07-02 DIAGNOSIS — M545 Low back pain, unspecified: Secondary | ICD-10-CM

## 2020-07-02 DIAGNOSIS — M25552 Pain in left hip: Secondary | ICD-10-CM

## 2020-07-02 DIAGNOSIS — M79605 Pain in left leg: Secondary | ICD-10-CM | POA: Diagnosis not present

## 2020-07-02 DIAGNOSIS — M25551 Pain in right hip: Secondary | ICD-10-CM

## 2020-07-02 DIAGNOSIS — G8929 Other chronic pain: Secondary | ICD-10-CM

## 2020-07-02 DIAGNOSIS — G894 Chronic pain syndrome: Secondary | ICD-10-CM | POA: Diagnosis not present

## 2020-07-02 NOTE — Progress Notes (Signed)
Patient: Cynthia Bennett  Service Category: E/M  Provider: Gillis Santa, MD  DOB: 1933/01/31  DOS: 07/02/2020  Location: Office  MRN: [id number]  Setting: Ambulatory outpatient  Referring Provider: [name], MD  Type: Established Patient  Specialty: Interventional Pain Management  PCP: Cynthia Pink, MD  Location: Home  Delivery: TeleHealth     Virtual Encounter - Pain Management PROVIDER NOTE: Information contained herein reflects review and annotations entered in association with encounter. Interpretation of such information and data should be left to medically-trained personnel. Information provided to patient can be located elsewhere in the medical record under "Patient Instructions". Document created using STT-dictation technology, any transcriptional errors that may result from process are unintentional.    Contact & Pharmacy Preferred: [place]: [phone] (home) Mobile: There is no such number on file (mobile). E-mail: [email or web address]  [id number] [place] [id number]: [phone]: [phone]  Pre-screening  Cynthia Bennett offered "in-person" vs "virtual" encounter. She indicated preferring virtual for this encounter.   Reason COVID-19*  Social distancing based on CDC and AMA recommendations.   I contacted Cynthia Bennett 07/02/2020 via telephone.      I clearly identified myself as BGillis Santa MD. I verified that I was speaking with the correct person using two identifiers (Name: Cynthia Bennett and date of birth: 603-02-1933.  Consent I sought verbal advanced consent from Cynthia Bennett virtual visit interactions. I informed Cynthia Bennett of possible security and privacy concerns, risks, and limitations associated with providing "not-in-person" medical evaluation and management services. I also informed Cynthia Bennett of the availability of "in-person" appointments. Finally, I informed her that there would be a  charge for the virtual visit and that she could be  personally, fully or partially, financially responsible for it. Ms. WRugglesexpressed understanding and agreed to proceed.   Historic Elements   Ms. FKATLYN Bennett a 86y.o. year old, female patient evaluated today after her last contact with our practice on 05/08/2020. Cynthia Bennett has a past medical history of Dizzy spells, Fibromyalgia, Glaucoma, Hiatal hernia, Osteoarthritis, Osteoarthritis of spine with radiculopathy, lumbar region (08/14/2015), Peptic ulcer disease, Reflux, Spinal stenosis, [place], and TIA (transient ischemic attack). She also  has a past surgical history that includes Esophagogastroduodenoscopy (N/A, 04/16/2015); Appendectomy; and Abdominal hysterectomy. Ms. WNorthingtonhas a current medication list which includes the following prescription(s): acetaminophen, bupropion, vitamin d3, cyanocobalamin, doxepin, esomeprazole, cvs slow release iron, multivitamin, oxycodone, pantoprazole, sertraline, zolpidem, and oxycodone. She  reports that she has quit smoking. She has never used smokeless tobacco. She reports that she does not drink alcohol and does not use drugs. Ms. WSittonis allergic to ibuprofen.   HPI  Today, she is being contacted for medication management.  Of note, I clarified with patient that she still has a prescription of oxycodone that she can pick up from her pharmacy when her current prescription is out.  Patient was not aware of this.  She takes these medications as needed but usually twice a day.  I will hold off on refilling her medications today.  I have instructed the patient to call and request an appointment when she picks up her next prescription and is down to half the bottle.  Patient endorsed understanding.  Pharmacotherapy Assessment  Analgesic: 05/30/2020  2   05/08/2020  Oxycodone Hcl 5 MG Tablet  60.00  30 Fr Nav   [id number]  Nor (1409)   0/0  15.00 MME  Medicare   [place]     Monitoring: [place] PMP: PDMP reviewed during  this encounter.       Pharmacotherapy: No side-effects or adverse reactions reported. Compliance: No problems identified. Effectiveness: Clinically acceptable. Plan: Refer to "POC".  UDS:  Summary  Date Value Ref Range Status  04/10/2020 Note  Final    Comment:    ==================================================================== ToxASSURE Select 13 (MW) ==================================================================== Test                             Result       Flag       Units Drug Present and Declared for Prescription Verification   Oxycodone                      919          EXPECTED   ng/mg creat   Oxymorphone                    57           EXPECTED   ng/mg creat   Noroxycodone                   3286         EXPECTED   ng/mg creat    Sources of oxycodone include scheduled prescription medications.    Oxymorphone and noroxycodone are expected metabolites of oxycodone.    Oxymorphone is also available as a scheduled prescription medication. ==================================================================== Test                      Result    Flag   Units      Ref Range   Creatinine              168              mg/dL      >=20 ==================================================================== Declared Medications:  The flagging and interpretation on this report are based on the  following declared medications.  Unexpected results may arise from  inaccuracies in the declared medications.  **Note: The testing scope of this panel includes these medications:  Oxycodone (Roxicodone)  **Note: The testing scope of this panel does not include the  following reported medications:  Acetaminophen (Tylenol)  Bupropion (Wellbutrin SR)  Dorzolamide  Doxepin  Esomeprazole (Nexium)  Iron  Multivitamin  Pantoprazole (Protonix)  Sertraline (Zoloft)  Timolol  Vitamin D3  Zolpidem (Ambien) ==================================================================== For clinical  consultation, please call [phone]. ====================================================================     Laboratory Chemistry Profile   Renal Lab Results  Component Value Date   BUN 14 01/10/2019   CREATININE 1.26 (H) 01/10/2019   BCR 11 (L) 01/10/2019   GFRAA 46 (L) 01/10/2019   GFRNONAA 39 (L) 01/10/2019     Hepatic Lab Results  Component Value Date   AST 18 01/10/2019   ALT 11 (L) 06/24/2016   ALBUMIN 4.3 01/10/2019   ALKPHOS 74 01/10/2019   LIPASE 139 04/27/2014     Electrolytes Lab Results  Component Value Date   NA 141 01/10/2019   K 4.7 01/10/2019   CL 104 01/10/2019   CALCIUM 9.5 01/10/2019   MG 2.3 01/10/2019     Bone Lab Results  Component Value Date   25OHVITD1 26 (L) 01/10/2019   25OHVITD2 1.6 01/10/2019   25OHVITD3 24 01/10/2019  Inflammation (CRP: Acute Phase) (ESR: Chronic Phase) Lab Results  Component Value Date   CRP 2 01/10/2019   ESRSEDRATE 51 (H) 01/10/2019       Note: Above Lab results reviewed.   Imaging  DG PAIN CLINIC C-ARM 1-60 MIN NO REPORT Fluoro was used, but no Radiologist interpretation will be provided.  Please refer to "NOTES" tab for provider progress note.  Assessment  The primary encounter diagnosis was Chronic low back pain (1ry area of Pain) (Bilateral) (L>R). Diagnoses of Chronic lower extremity pain (2ry area of Pain) (Left), Chronic hip pain (3ry area of Pain) (Bilateral) (R>L), and Chronic pain syndrome were also pertinent to this visit.  Plan of Care   Continue oxycodone as prescribed.  Patient still has 1 prescription that she can pick up from her pharmacy.  No side effects with this medication.  Patient instructed to call for appointment when she picks up her next prescription and is down to half the bottle.  Patient endorsed understanding.  Follow-up plan:   Return Patient will call when it is time for medication refill; Mera Gunkel or [place].   Recent Visits Date Type Provider Dept  05/08/20  Telemedicine Milinda Pointer, MD [facility]  04/24/20 Office Visit Milinda Pointer, MD [facility]  04/10/20 Procedure visit Milinda Pointer, MD [facility]  04/04/20 Telemedicine Milinda Pointer, MD [facility]  Showing recent visits within past 90 days and meeting all other requirements Today's Visits Date Type Provider Dept  07/02/20 Telemedicine Cynthia Santa, MD [facility]  Showing today's visits and meeting all other requirements Future Appointments No visits were found meeting these conditions. Showing future appointments within next 90 days and meeting all other requirements  I discussed the assessment and treatment plan with the patient. The patient was provided an opportunity to ask questions and all were answered. The patient agreed with the plan and demonstrated an understanding of the instructions.  Patient advised to call back or seek an in-person evaluation if the symptoms or condition worsens.  Duration of encounter: 10 minutes.  Note by: Cynthia Santa, MD Date: 07/02/2020; Time: 3:48 PM

## 2020-07-09 DIAGNOSIS — M797 Fibromyalgia: Secondary | ICD-10-CM | POA: Diagnosis not present

## 2020-07-09 DIAGNOSIS — K219 Gastro-esophageal reflux disease without esophagitis: Secondary | ICD-10-CM | POA: Diagnosis not present

## 2020-07-09 DIAGNOSIS — E538 Deficiency of other specified B group vitamins: Secondary | ICD-10-CM | POA: Diagnosis not present

## 2020-07-09 DIAGNOSIS — E559 Vitamin D deficiency, unspecified: Secondary | ICD-10-CM | POA: Diagnosis not present

## 2020-07-09 DIAGNOSIS — G4700 Insomnia, unspecified: Secondary | ICD-10-CM | POA: Diagnosis not present

## 2020-07-09 DIAGNOSIS — Z0000 Encounter for general adult medical examination without abnormal findings: Secondary | ICD-10-CM | POA: Diagnosis not present

## 2020-07-12 DIAGNOSIS — E559 Vitamin D deficiency, unspecified: Secondary | ICD-10-CM | POA: Diagnosis not present

## 2020-07-12 DIAGNOSIS — E785 Hyperlipidemia, unspecified: Secondary | ICD-10-CM | POA: Diagnosis not present

## 2020-07-12 DIAGNOSIS — E538 Deficiency of other specified B group vitamins: Secondary | ICD-10-CM | POA: Diagnosis not present

## 2020-07-12 DIAGNOSIS — Z0000 Encounter for general adult medical examination without abnormal findings: Secondary | ICD-10-CM | POA: Diagnosis not present

## 2020-07-24 DIAGNOSIS — Z130 Encounter for screening for diseases of the blood and blood-forming organs and certain disorders involving the immune mechanism: Secondary | ICD-10-CM | POA: Diagnosis not present

## 2020-08-17 DIAGNOSIS — Z130 Encounter for screening for diseases of the blood and blood-forming organs and certain disorders involving the immune mechanism: Secondary | ICD-10-CM | POA: Diagnosis not present

## 2020-08-20 ENCOUNTER — Ambulatory Visit: Payer: PPO | Admitting: Pain Medicine

## 2020-08-23 ENCOUNTER — Other Ambulatory Visit: Payer: Self-pay

## 2020-08-23 ENCOUNTER — Encounter: Payer: Self-pay | Admitting: Internal Medicine

## 2020-08-24 ENCOUNTER — Encounter: Payer: Self-pay | Admitting: Internal Medicine

## 2020-08-24 ENCOUNTER — Inpatient Hospital Stay: Payer: PPO | Attending: Internal Medicine | Admitting: Internal Medicine

## 2020-08-24 ENCOUNTER — Inpatient Hospital Stay: Payer: PPO

## 2020-08-24 DIAGNOSIS — M2550 Pain in unspecified joint: Secondary | ICD-10-CM | POA: Diagnosis not present

## 2020-08-24 DIAGNOSIS — Z87891 Personal history of nicotine dependence: Secondary | ICD-10-CM | POA: Insufficient documentation

## 2020-08-24 DIAGNOSIS — R5383 Other fatigue: Secondary | ICD-10-CM | POA: Insufficient documentation

## 2020-08-24 DIAGNOSIS — E611 Iron deficiency: Secondary | ICD-10-CM | POA: Insufficient documentation

## 2020-08-24 DIAGNOSIS — Z818 Family history of other mental and behavioral disorders: Secondary | ICD-10-CM | POA: Insufficient documentation

## 2020-08-24 DIAGNOSIS — G8929 Other chronic pain: Secondary | ICD-10-CM | POA: Insufficient documentation

## 2020-08-24 DIAGNOSIS — M549 Dorsalgia, unspecified: Secondary | ICD-10-CM | POA: Diagnosis not present

## 2020-08-24 DIAGNOSIS — R61 Generalized hyperhidrosis: Secondary | ICD-10-CM | POA: Insufficient documentation

## 2020-08-24 DIAGNOSIS — R634 Abnormal weight loss: Secondary | ICD-10-CM | POA: Diagnosis not present

## 2020-08-24 DIAGNOSIS — M1990 Unspecified osteoarthritis, unspecified site: Secondary | ICD-10-CM | POA: Insufficient documentation

## 2020-08-24 DIAGNOSIS — D649 Anemia, unspecified: Secondary | ICD-10-CM | POA: Insufficient documentation

## 2020-08-24 DIAGNOSIS — R0602 Shortness of breath: Secondary | ICD-10-CM | POA: Diagnosis not present

## 2020-08-24 DIAGNOSIS — Z836 Family history of other diseases of the respiratory system: Secondary | ICD-10-CM | POA: Insufficient documentation

## 2020-08-24 DIAGNOSIS — Z8673 Personal history of transient ischemic attack (TIA), and cerebral infarction without residual deficits: Secondary | ICD-10-CM | POA: Insufficient documentation

## 2020-08-24 DIAGNOSIS — Z886 Allergy status to analgesic agent status: Secondary | ICD-10-CM | POA: Diagnosis not present

## 2020-08-24 DIAGNOSIS — Z79899 Other long term (current) drug therapy: Secondary | ICD-10-CM | POA: Insufficient documentation

## 2020-08-24 DIAGNOSIS — D509 Iron deficiency anemia, unspecified: Secondary | ICD-10-CM

## 2020-08-24 DIAGNOSIS — Z9049 Acquired absence of other specified parts of digestive tract: Secondary | ICD-10-CM | POA: Diagnosis not present

## 2020-08-24 LAB — CBC WITH DIFFERENTIAL/PLATELET
Abs Immature Granulocytes: 0.09 10*3/uL — ABNORMAL HIGH (ref 0.00–0.07)
Basophils Absolute: 0.1 10*3/uL (ref 0.0–0.1)
Basophils Relative: 1 %
Eosinophils Absolute: 0.4 10*3/uL (ref 0.0–0.5)
Eosinophils Relative: 4 %
HCT: 31.6 % — ABNORMAL LOW (ref 36.0–46.0)
Hemoglobin: 9.3 g/dL — ABNORMAL LOW (ref 12.0–15.0)
Immature Granulocytes: 1 %
Lymphocytes Relative: 24 %
Lymphs Abs: 2.1 10*3/uL (ref 0.7–4.0)
MCH: 21.8 pg — ABNORMAL LOW (ref 26.0–34.0)
MCHC: 29.4 g/dL — ABNORMAL LOW (ref 30.0–36.0)
MCV: 74 fL — ABNORMAL LOW (ref 80.0–100.0)
Monocytes Absolute: 0.9 10*3/uL (ref 0.1–1.0)
Monocytes Relative: 10 %
Neutro Abs: 5.1 10*3/uL (ref 1.7–7.7)
Neutrophils Relative %: 60 %
Platelets: 463 10*3/uL — ABNORMAL HIGH (ref 150–400)
RBC: 4.27 MIL/uL (ref 3.87–5.11)
RDW: 18.1 % — ABNORMAL HIGH (ref 11.5–15.5)
WBC: 8.6 10*3/uL (ref 4.0–10.5)
nRBC: 0 % (ref 0.0–0.2)

## 2020-08-24 LAB — RETICULOCYTES
Immature Retic Fract: 23.1 % — ABNORMAL HIGH (ref 2.3–15.9)
RBC.: 4.3 MIL/uL (ref 3.87–5.11)
Retic Count, Absolute: 82.6 10*3/uL (ref 19.0–186.0)
Retic Ct Pct: 1.9 % (ref 0.4–3.1)

## 2020-08-24 LAB — IRON AND TIBC
Iron: 42 ug/dL (ref 28–170)
Saturation Ratios: 11 % (ref 10.4–31.8)
TIBC: 388 ug/dL (ref 250–450)
UIBC: 346 ug/dL

## 2020-08-24 LAB — LACTATE DEHYDROGENASE: LDH: 133 U/L (ref 98–192)

## 2020-08-24 LAB — TECHNOLOGIST SMEAR REVIEW: Plt Morphology: INCREASED

## 2020-08-24 LAB — C-REACTIVE PROTEIN: CRP: 0.9 mg/dL (ref <1.0)

## 2020-08-24 LAB — FERRITIN: Ferritin: 5 ng/mL — ABNORMAL LOW (ref 11–307)

## 2020-08-24 NOTE — Progress Notes (Signed)
[name] NOTE  Patient Care Team: [name], MD as PCP - General (Family Medicine)  CHIEF COMPLAINTS/PURPOSE OF CONSULTATION: Anemia   HEMATOLOGY HISTORY  # CHRONIC NORMOCYTIC ANEMIA [since 2019-PCP- Hb 8-9; Iron sat- 5%;NO Ferritin; GFR- 43]EGD/colonoscopy-?2013-2104  HISTORY OF PRESENTING ILLNESS:  Cynthia Bennett 86 y.o.  female has been referred to [place] for further evaluation/work-up for anemia.  Patient states to have anemia when she was pregnant-needing IV iron infusions.  However more recently patient noted to have worsening anemia of the last 1 to 2 years.  Patient has been on p.o. iron for the last many months.  Anemia not improving in fact getting worse.  Patient complains of fatigue.  Complains of shortness of breath with exertion.  Positive for weight loss.  Complains of night sweats.  Blood in stools: None Change in bowel habits- None Blood in urine: None Difficulty swallowing: None Abnormal weight loss: None Iron supplementation: Yes; on PO iron.  Prior Blood transfusions: None Vaginal bleeding/TAH: None   Review of Systems  Constitutional: Positive for malaise/fatigue and weight loss. Negative for chills, diaphoresis and fever.  HENT: Negative for nosebleeds and sore throat.   Eyes: Negative for double vision.  Respiratory: Negative for cough, hemoptysis, sputum production, shortness of breath and wheezing.   Cardiovascular: Negative for chest pain, palpitations, orthopnea and leg swelling.  Gastrointestinal: Negative for abdominal pain, blood in stool, constipation, diarrhea, heartburn, melena, nausea and vomiting.  Genitourinary: Negative for dysuria, frequency and urgency.  Musculoskeletal: Positive for back pain and joint pain.  Skin: Negative.  Negative for itching and rash.  Neurological: Negative for dizziness, tingling, focal weakness, weakness and headaches.  Endo/Heme/Allergies: Does not bruise/bleed easily.   Psychiatric/Behavioral: Negative for depression. The patient is not nervous/anxious and does not have insomnia.     MEDICAL HISTORY:  Past Medical History:  Diagnosis Date  . Dizzy spells   . Fibromyalgia   . Glaucoma   . Hiatal hernia   . Osteoarthritis   . Osteoarthritis of spine with radiculopathy, lumbar region 08/14/2015  . Peptic ulcer disease   . Reflux   . Spinal stenosis   . Stroke ([place])   . TIA (transient ischemic attack)     SURGICAL HISTORY: Past Surgical History:  Procedure Laterality Date  . ABDOMINAL HYSTERECTOMY    . APPENDECTOMY    . ESOPHAGOGASTRODUODENOSCOPY N/A 04/16/2015   Procedure: ESOPHAGOGASTRODUODENOSCOPY (EGD);  Surgeon: Hulen Luster, MD;  Location: [facility] ENDOSCOPY;  Service: Gastroenterology;  Laterality: N/A;    SOCIAL HISTORY: Social History   Socioeconomic History  . Marital status: Widowed    Spouse name: Not on file  . Number of children: Not on file  . Years of education: Not on file  . Highest education level: Not on file  Occupational History  . Not on file  Tobacco Use  . Smoking status: Former Research scientist (life sciences)  . Smokeless tobacco: Never Used  Substance and Sexual Activity  . Alcohol use: No    Alcohol/week: 0.0 standard drinks  . Drug use: No  . Sexual activity: Not on file  Other Topics Concern  . Not on file  Social History Narrative   Pt lives in [place]; with son. Quit smoking in 1957. No alcohol. Designed clothes/ alterations.    Social Determinants of Health   Financial Resource Strain:   . Difficulty of Paying Living Expenses: Not on file  Food Insecurity:   . Worried About Charity fundraiser in the Last Year: Not  on file  . Ran Out of Food in the Last Year: Not on file  Transportation Needs:   . Lack of Transportation (Medical): Not on file  . Lack of Transportation (Non-Medical): Not on file  Physical Activity:   . Days of Exercise per Week: Not on file  . Minutes of Exercise per Session: Not on file  Stress:   .  Feeling of Stress : Not on file  Social Connections:   . Frequency of Communication with Friends and Family: Not on file  . Frequency of Social Gatherings with Friends and Family: Not on file  . Attends Religious Services: Not on file  . Active Member of Clubs or Organizations: Not on file  . Attends Archivist Meetings: Not on file  . Marital Status: Not on file  Intimate Partner Violence:   . Fear of Current or Ex-Partner: Not on file  . Emotionally Abused: Not on file  . Physically Abused: Not on file  . Sexually Abused: Not on file    FAMILY HISTORY: Family History  Problem Relation Age of Onset  . Mental illness Mother   . COPD Father     ALLERGIES:  is allergic to ibuprofen.  MEDICATIONS:  Current Outpatient Medications  Medication Sig Dispense Refill  . acetaminophen (TYLENOL) 325 MG tablet Take 650 mg by mouth every 6 (six) hours as needed.    Marland Kitchen buPROPion (WELLBUTRIN SR) 150 MG 12 hr tablet TAKE 1 TABLET BY MOUTH ONCE DAILY    . Cholecalciferol (VITAMIN D3) 20 MCG (800 UNIT) TABS Take by mouth 2 (two) times daily.    . cyanocobalamin (,VITAMIN B-12,) 1000 MCG/ML injection Inject 1,000 mcg into the muscle every 30 (thirty) days.    Marland Kitchen doxepin (SINEQUAN) 100 MG capsule Take 100 mg by mouth at bedtime.    . Ferrous Sulfate ([facility] SLOW RELEASE IRON) 143 (45 Fe) MG TBCR Take by mouth daily.    . Multiple Vitamin (MULTIVITAMIN) tablet Take 1 tablet by mouth daily.    Marland Kitchen oxyCODONE (OXY IR/ROXICODONE) 5 MG immediate release tablet Take 1 tablet (5 mg total) by mouth every 12 (twelve) hours as needed for severe pain. Must last 30 days 60 tablet 0  . pantoprazole (PROTONIX) 40 MG tablet Take 1 tablet by mouth 2 (two) times daily.  11  . sertraline (ZOLOFT) 100 MG tablet Take 1 tablet by mouth every morning.    . zolpidem (AMBIEN) 10 MG tablet TAKE 1/2 TO 1 TABLET BY MOUTH EVERY NIGHT AT BEDTIME    . oxyCODONE (OXY IR/ROXICODONE) 5 MG immediate release tablet Take 1 tablet (5  mg total) by mouth every 12 (twelve) hours as needed for severe pain. Must last 30 days 60 tablet 0   No current facility-administered medications for this visit.      PHYSICAL EXAMINATION:   Vitals:   08/24/20 1303  BP: (!) 142/81  Pulse: (!) 117  Resp: 20  Temp: 99.2 F (37.3 C)  SpO2: 100%   Filed Weights   08/24/20 1303  Weight: 163 lb (73.9 kg)    Physical Exam HENT:     Head: Normocephalic and atraumatic.     Mouth/Throat:     Pharynx: No oropharyngeal exudate.  Eyes:     Pupils: Pupils are equal, round, and reactive to light.  Cardiovascular:     Rate and Rhythm: Normal rate and regular rhythm.  Pulmonary:     Effort: Pulmonary effort is normal. No respiratory distress.  Breath sounds: Normal breath sounds. No wheezing.  Abdominal:     General: Bowel sounds are normal. There is no distension.     Palpations: Abdomen is soft. There is no mass.     Tenderness: There is no abdominal tenderness. There is no guarding or rebound.  Musculoskeletal:        General: No tenderness. Normal range of motion.     Cervical back: Normal range of motion and neck supple.  Skin:    General: Skin is warm.  Neurological:     Mental Status: She is alert and oriented to person, place, and time.  Psychiatric:        Mood and Affect: Affect normal.     LABORATORY DATA:  I have reviewed the data as listed Lab Results  Component Value Date   WBC 9.0 06/24/2016   HGB 12.7 06/24/2016   HCT 38.6 06/24/2016   MCV 78.7 (L) 06/24/2016   PLT 291 06/24/2016   No results for input(s): NA, K, CL, CO2, GLUCOSE, BUN, CREATININE, CALCIUM, GFRNONAA, GFRAA, PROT, ALBUMIN, AST, ALT, ALKPHOS, BILITOT, BILIDIR, IBILI in the last 8760 hours.   No results found.  Normocytic anemia # Normocytic anemia:    # DISPOSITION: # labs today- peripheral smear; CBC with diff; CMP;HOLD tube; LDH; haptoglobin; retic count.  #   Microcytic anemia # microocytic anemia: Hb-8-9 [MCV- 77] pt on  PO iron; not improving; in fact getting worse.  I had a long discussion with patient regarding her multiple cause of anemia-including malignancy/MDS.  Recommend checking peripheral smear; CBC with diff; CMP;HOLD tube; LDH; haptoglobin; retic count; MM panel; K/l light chains; Iron studies/ferritin.  I also discussed the possible need for bone marrow biopsy if above work-up was inconclusive.   # Discussed the potential acute infusion reactions with IV iron; which are quite rare.  Patient understands the risk; will proceed with infusions.  # Chronic back pain/? scaitica [Dr.Neivera]-stable.  Thank you Dr.Hedrick for allowing me to participate in the care of your pleasant patient. Please do not hesitate to contact me with questions or concerns in the interim.  # DISPOSITION: # labs today-ordered # follow up TBD-Dr.B  Cc; Dr.Hedrick     All questions were answered. The patient knows to call the clinic with any problems, questions or concerns.      Cammie Sickle, MD 08/24/2020 1:42 PM

## 2020-08-24 NOTE — Assessment & Plan Note (Signed)
#   Normocytic anemia:    # DISPOSITION: # labs today- peripheral smear; CBC with diff; CMP;HOLD tube; LDH; haptoglobin; retic count.  #

## 2020-08-24 NOTE — Assessment & Plan Note (Addendum)
#  microocytic anemia: Hb-8-9 [MCV- 77] pt on PO iron; not improving; in fact getting worse.  I had a long discussion with patient regarding her multiple cause of anemia-including malignancy/MDS.  Recommend checking peripheral smear; CBC with diff; CMP;HOLD tube; LDH; haptoglobin; retic count; MM panel; K/l light chains; Iron studies/ferritin.  I also discussed the possible need for bone marrow biopsy if above work-up was inconclusive.   # Discussed the potential acute infusion reactions with IV iron; which are quite rare.  Patient understands the risk; will proceed with infusions.  # Chronic back pain/? scaitica [Dr.Neivera]-stable.  Thank you Dr.Hedrick for allowing me to participate in the care of your pleasant patient. Please do not hesitate to contact me with questions or concerns in the interim.  # DISPOSITION: # labs today-ordered # follow up TBD-Dr.B  Cc; Dr.Hedrick

## 2020-08-24 NOTE — Progress Notes (Signed)
Pt expressed fatigue, low energy and SOB. HR is elevated at 117 after walking to the scale and sitting down.

## 2020-08-26 LAB — HAPTOGLOBIN: Haptoglobin: 153 mg/dL (ref 41–333)

## 2020-08-26 LAB — SOLUBLE TRANSFERRIN RECEPTOR: Transferrin Receptor: 44.9 nmol/L — ABNORMAL HIGH (ref 12.2–27.3)

## 2020-08-27 LAB — KAPPA/LAMBDA LIGHT CHAINS
Kappa free light chain: 37.8 mg/L — ABNORMAL HIGH (ref 3.3–19.4)
Kappa, lambda light chain ratio: 1.38 (ref 0.26–1.65)
Lambda free light chains: 27.4 mg/L — ABNORMAL HIGH (ref 5.7–26.3)

## 2020-08-28 ENCOUNTER — Telehealth: Payer: Self-pay | Admitting: *Deleted

## 2020-08-28 LAB — MULTIPLE MYELOMA PANEL, SERUM
Albumin SerPl Elph-Mcnc: 3.6 g/dL (ref 2.9–4.4)
Albumin/Glob SerPl: 1.2 (ref 0.7–1.7)
Alpha 1: 0.3 g/dL (ref 0.0–0.4)
Alpha2 Glob SerPl Elph-Mcnc: 0.9 g/dL (ref 0.4–1.0)
B-Globulin SerPl Elph-Mcnc: 1.1 g/dL (ref 0.7–1.3)
Gamma Glob SerPl Elph-Mcnc: 1 g/dL (ref 0.4–1.8)
Globulin, Total: 3.2 g/dL (ref 2.2–3.9)
IgA: 187 mg/dL (ref 64–422)
IgG (Immunoglobin G), Serum: 950 mg/dL (ref 586–1602)
IgM (Immunoglobulin M), Srm: 114 mg/dL (ref 26–217)
Total Protein ELP: 6.8 g/dL (ref 6.0–8.5)

## 2020-08-28 NOTE — Telephone Encounter (Signed)
Dr. B - please advise. 

## 2020-08-28 NOTE — Telephone Encounter (Signed)
Patient called reporting that she had labs done last week and she is asking if Dr B has decided what is going to be done next. Please advise  CBC with Differential/Platelet Order: [id number] Status:  Final result Visible to patient:  Yes (seen) Next appt:  None Dx:  Microcytic anemia  0 Result Notes  Ref Range & Units 4 d ago 4 yr ago  WBC 4.0 - 10.5 K/uL 8.6  9.0 R   RBC 3.87 - 5.11 MIL/uL 4.27  4.91 R   Hemoglobin 12.0 - 15.0 g/dL 9.3Low  12.7 R   Comment: Reticulocyte Hemoglobin testing  may be clinically indicated,  consider ordering this additional  test [id number]   HCT 36 - 46 % 31.6Low  38.6 R   MCV 80.0 - 100.0 fL 74.0Low  78.7Low   MCH 26.0 - 34.0 pg 21.8Low  25.9Low   MCHC 30.0 - 36.0 g/dL 29.4Low  32.9 R   RDW 11.5 - 15.5 % 18.1High  20.4High R   Platelets 150 - 400 K/uL 463High  291 R   nRBC 0.0 - 0.2 % 0.0    Neutrophils Relative % % 60    Neutro Abs 1.7 - 7.7 K/uL 5.1    Lymphocytes Relative % 24    Lymphs Abs 0.7 - 4.0 K/uL 2.1    Monocytes Relative % 10    Monocytes Absolute 0.1 - 1.0 K/uL 0.9    Eosinophils Relative % 4    Eosinophils Absolute 0.0 - 0.5 K/uL 0.4    Basophils Relative % 1    Basophils Absolute 0.0 - 0.1 K/uL 0.1    Immature Granulocytes % 1    Abs Immature Granulocytes 0.00 - 0.07 K/uL 0.09High    Comment: Performed at [facility], [address] [place], [place] [address]  Resulting Agency  [facility] CLIN LAB [facility] CLIN LAB      Specimen Collected: 08/24/20 13:41 Last Resulted: 08/24/20 13:58     Lab Flowsheet   Order Details   View Encounter   Lab and Collection Details   Routing   Result History     R=Reference range differs from displayed range    Result Care Coordination  Patient Communication  Add Comments Seen Back to Top      Other Results from 08/24/2020  Contains abnormal dataReticulocytes  Status:  Final result Visible to patient:  Yes (seen) Next appt:  None Dx:  Microcytic  anemia Order: [id number]  0 Result Notes  Ref Range & Units 4 d ago  Retic Ct Pct 0.4 - 3.1 % 1.9   RBC. 3.87 - 5.11 MIL/uL 4.30   Retic Count, Absolute 19.0 - 186.0 K/uL 82.6   Immature Retic Fract 2.3 - 15.9 % 23.1High   Comment: Performed at [facility], [address] [place], [place] [address]  Resulting Agency  [facility] CLIN LAB      Specimen Collected: 08/24/20 13:41 Last Resulted: 08/24/20 13:58     Lab Flowsheet   Order Details   View Encounter   Lab and Collection Details   Routing   Result History       Result Care Coordination  Patient Communication  Add Comments Seen Back to Top        Contains abnormal dataKappa/lambda light chains  Status:  Final result Visible to patient:  Yes (not seen) Next appt:  None Dx:  Microcytic anemia Order: [id number]  0 Result Notes  Ref Range & Units 4 d ago  Kappa free  light chain 3.3 - 19.4 mg/L 37.8High   Lamda free light chains 5.7 - 26.3 mg/L 27.4High   Kappa, lamda light chain ratio 0.26 - 1.65 1.38   Comment: (NOTE)  Performed At: [facility]  [place], [place] [id number]  Rush Farmer MD HB:[id number]   Resulting Agency  [facility] CLIN LAB      Specimen Collected: 08/24/20 13:41 Last Resulted: 08/27/20 16:37     Lab Flowsheet   Order Details   View Encounter   Lab and Collection Details   Routing   Result History       Result Care Coordination  Patient Communication  Add Comments Add Notifications Back to Top        Multiple Myeloma Panel (SPEP&IFE w/QIG)  Status:  In process Visible to patient:  No (not released) Next appt:  None Dx:  Microcytic anemia Order: [id number]  0 Result Notes     Specimen Collected: 08/24/20 13:41 Last Resulted: 08/24/20 13:42     Order Details   View Encounter   Lab and Collection Details   Routing   Result History       Result Care Coordination  Patient Communication  Not Released Not seen Back to Top         C-reactive protein  Status:  Final result Visible to patient:  Yes (seen) Next appt:  None Dx:  Microcytic anemia Order: [id number]  0 Result Notes  Ref Range & Units 4 d ago 1 yr ago  CRP <1.0 mg/dL 0.9  2 R   Comment: Performed at [facility], [address], [place], [place] [address]  Resulting Agency  [facility] CLIN LAB [facility]      Specimen Collected: 08/24/20 13:41 Last Resulted: 08/24/20 20:28     Lab Flowsheet   Order Details   View Encounter   Lab and Collection Details   Routing   Result History     R=Reference range differs from displayed range    Result Care Coordination  Patient Communication  Add Comments Seen Back to Top        Haptoglobin  Status:  Final result Visible to patient:  Yes (not seen) Next appt:  None Dx:  Microcytic anemia Order: [id number]  0 Result Notes  Ref Range & Units 4 d ago  Haptoglobin 41 - 333 mg/dL 153   Comment: (NOTE)  Performed At: [facility]  [place], [place] [id number]  Rush Farmer MD OI:[id number]   Resulting Agency  [facility] CLIN LAB      Specimen Collected: 08/24/20 13:41 Last Resulted: 08/26/20 16:36     Lab Flowsheet   Order Details   View Encounter   Lab and Collection Details   Routing   Result History       Result Care Coordination  Patient Communication  Add Comments Add Notifications Back to Top        Contains abnormal dataFerritin  Status:  Final result Visible to patient:  Yes (seen) Next appt:  None Dx:  Microcytic anemia Order: [id number]  0 Result Notes  Ref Range & Units 4 d ago  Ferritin 11 - 307 ng/mL 5Low   Comment: Performed at [facility], [address] [place], [place] [address]  Resulting Agency  [facility] CLIN LAB      Specimen Collected: 08/24/20 13:41 Last Resulted: 08/24/20 15:09     Lab Flowsheet   Order Details   View Encounter   Lab and Collection Details  Routing   Result History        Result Care Coordination  Patient Communication  Add Comments Seen Back to Top        Contains abnormal dataSoluble transferrin receptor  Status:  Final result Visible to patient:  Yes (not seen) Next appt:  None Dx:  Microcytic anemia Order: [id number]  0 Result Notes  Ref Range & Units 4 d ago  Transferrin Receptor 12.2 - 27.3 nmol/L 44.9High   Comment: (NOTE)  Performed At: [facility]  [place], [place] [id number]  Rush Farmer MD VB:[id number]   Resulting Agency  [facility] CLIN LAB      Specimen Collected: 08/24/20 13:41 Last Resulted: 08/26/20 06:40     Lab Flowsheet   Order Details   View Encounter   Lab and Collection Details   Routing   Result History       Result Care Coordination  Patient Communication  Add Comments Add Notifications Back to Top        Lactate dehydrogenase  Status:  Final result Visible to patient:  Yes (seen) Next appt:  None Dx:  Microcytic anemia Order: [id number]  0 Result Notes  Ref Range & Units 4 d ago  LDH 98 - 192 U/L 133   Comment: Performed at [facility], [address] [place], [place] [address]  Resulting Agency  [facility] CLIN LAB      Specimen Collected: 08/24/20 13:41 Last Resulted: 08/24/20 14:12     Lab Flowsheet   Order Details   View Encounter   Lab and Collection Details   Routing   Result History       Result Care Coordination  Patient Communication  Add Comments Seen Back to Top        Iron and TIBC  Status:  Final result Visible to patient:  Yes (seen) Next appt:  None Dx:  Microcytic anemia Order: [id number]  0 Result Notes  Ref Range & Units 4 d ago  Iron 28 - 170 ug/dL 42   TIBC 250 - 450 ug/dL 388   Saturation Ratios 10.4 - 31.8 % 11   UIBC ug/dL 346   Comment: Performed at [facility], [address] [place], [place] [address]  Resulting Agency  [facility] CLIN LAB      Specimen Collected: 08/24/20 13:41 Last Resulted:  08/24/20 15:09     Lab Flowsheet   Order Details   View Encounter   Lab and Collection Details   Routing   Result History       Result Care Coordination  Patient Communication  Add Comments Seen Back to Top        Technologist smear review  Status:  Final result Visible to patient:  Yes (seen) Next appt:  None Dx:  Microcytic anemia Order: [id number]  0 Result Notes Component 4 d ago  WBC Morphology UNREMARKABLE   RBC Morphology MIXED RBC POPULATION   Tech Review PLATELETS APPEAR INCREASED   Comment: Normal platelet morphology  Performed at [facility], [address] [place], [place] [address]   [facility] CLIN LAB      Specimen Collected: 08/24/20 13:39 Last Resulted: 08/24/20 14:20

## 2020-08-29 ENCOUNTER — Other Ambulatory Visit: Payer: Self-pay | Admitting: Internal Medicine

## 2020-08-29 DIAGNOSIS — D649 Anemia, unspecified: Secondary | ICD-10-CM

## 2020-08-29 DIAGNOSIS — E611 Iron deficiency: Secondary | ICD-10-CM

## 2020-08-29 NOTE — Progress Notes (Signed)
Lab orders added.

## 2020-08-29 NOTE — Addendum Note (Signed)
Addended by: Keitha Butte on: 08/29/2020 08:55 AM   Modules accepted: Orders

## 2020-08-29 NOTE — Progress Notes (Signed)
H/T- please inform pt that I would recommend IV venofer to help build her iron leve/sand her hemaoglobin.please let pt know that I will try to reach her in the evening today.   C-Schedule- IV venofer weekly x3;   C- Follow up in 1 st week of dec 2021- MD; labs- cbc/bmp; possible Venofer-Dr.B

## 2020-08-29 NOTE — Telephone Encounter (Signed)
Spoke with pt and gave her Dr. Senaida Lange response from previous note.

## 2020-08-30 ENCOUNTER — Telehealth: Payer: Self-pay | Admitting: Internal Medicine

## 2020-08-30 NOTE — Telephone Encounter (Signed)
On 10/20-spoke to patient regarding results of her results of the blood work; iron deficiency likely secondary to CKD.  Patient scheduled for iron infusions/ follow-up.  Patient agreement.

## 2020-09-07 ENCOUNTER — Other Ambulatory Visit: Payer: Self-pay

## 2020-09-07 ENCOUNTER — Inpatient Hospital Stay: Payer: PPO

## 2020-09-07 VITALS — BP 132/81 | HR 92 | Temp 97.1°F | Resp 18

## 2020-09-07 DIAGNOSIS — E611 Iron deficiency: Secondary | ICD-10-CM

## 2020-09-07 DIAGNOSIS — D649 Anemia, unspecified: Secondary | ICD-10-CM | POA: Diagnosis not present

## 2020-09-07 MED ORDER — SODIUM CHLORIDE 0.9 % IV SOLN: 200.0000 mg | Freq: Once | INTRAVENOUS | Status: DC

## 2020-09-07 MED ORDER — SODIUM CHLORIDE 0.9 % IV SOLN
Freq: Once | INTRAVENOUS | Status: AC
  Filled 2020-09-07: qty 250

## 2020-09-07 MED ORDER — IRON SUCROSE 20 MG/ML IV SOLN
200.0000 mg | Freq: Once | INTRAVENOUS | Status: AC
  Administered 2020-09-07: 200 mg via INTRAVENOUS
  Filled 2020-09-07: qty 10

## 2020-09-07 NOTE — Progress Notes (Signed)
1200: Pt tolerated infusion well. No s/s of distress or reaction noted. Pt and VS stable at discharge.

## 2020-09-14 ENCOUNTER — Other Ambulatory Visit: Payer: Self-pay

## 2020-09-14 ENCOUNTER — Inpatient Hospital Stay: Payer: PPO | Attending: Internal Medicine

## 2020-09-14 VITALS — BP 122/74 | HR 101 | Temp 99.5°F | Resp 20

## 2020-09-14 DIAGNOSIS — Z79899 Other long term (current) drug therapy: Secondary | ICD-10-CM | POA: Insufficient documentation

## 2020-09-14 DIAGNOSIS — G8929 Other chronic pain: Secondary | ICD-10-CM | POA: Diagnosis not present

## 2020-09-14 DIAGNOSIS — M2550 Pain in unspecified joint: Secondary | ICD-10-CM | POA: Diagnosis not present

## 2020-09-14 DIAGNOSIS — Z87891 Personal history of nicotine dependence: Secondary | ICD-10-CM | POA: Diagnosis not present

## 2020-09-14 DIAGNOSIS — E611 Iron deficiency: Secondary | ICD-10-CM

## 2020-09-14 DIAGNOSIS — D509 Iron deficiency anemia, unspecified: Secondary | ICD-10-CM | POA: Diagnosis not present

## 2020-09-14 DIAGNOSIS — R5383 Other fatigue: Secondary | ICD-10-CM | POA: Insufficient documentation

## 2020-09-14 DIAGNOSIS — M549 Dorsalgia, unspecified: Secondary | ICD-10-CM | POA: Insufficient documentation

## 2020-09-14 DIAGNOSIS — Z836 Family history of other diseases of the respiratory system: Secondary | ICD-10-CM | POA: Diagnosis not present

## 2020-09-14 DIAGNOSIS — Z818 Family history of other mental and behavioral disorders: Secondary | ICD-10-CM | POA: Insufficient documentation

## 2020-09-14 MED ORDER — IRON SUCROSE 20 MG/ML IV SOLN
200.0000 mg | Freq: Once | INTRAVENOUS | Status: AC
  Administered 2020-09-14: 200 mg via INTRAVENOUS
  Filled 2020-09-14: qty 10

## 2020-09-14 MED ORDER — SODIUM CHLORIDE 0.9 % IV SOLN
Freq: Once | INTRAVENOUS | Status: AC
  Filled 2020-09-14: qty 250

## 2020-09-14 MED ORDER — SODIUM CHLORIDE 0.9 % IV SOLN: 200.0000 mg | Freq: Once | INTRAVENOUS | Status: DC

## 2020-09-21 ENCOUNTER — Inpatient Hospital Stay: Payer: PPO

## 2020-09-21 DIAGNOSIS — E611 Iron deficiency: Secondary | ICD-10-CM

## 2020-09-21 DIAGNOSIS — D509 Iron deficiency anemia, unspecified: Secondary | ICD-10-CM | POA: Diagnosis not present

## 2020-09-21 MED ORDER — SODIUM CHLORIDE 0.9 % IV SOLN: 200.0000 mg | Freq: Once | INTRAVENOUS | Status: DC

## 2020-09-21 MED ORDER — SODIUM CHLORIDE 0.9 % IV SOLN
Freq: Once | INTRAVENOUS | Status: AC
  Filled 2020-09-21: qty 250

## 2020-09-21 MED ORDER — IRON SUCROSE 20 MG/ML IV SOLN
200.0000 mg | Freq: Once | INTRAVENOUS | Status: AC
  Administered 2020-09-21: 200 mg via INTRAVENOUS
  Filled 2020-09-21: qty 10

## 2020-09-21 NOTE — Progress Notes (Signed)
Pt tolerated tx well, VSS, d/ced home 

## 2020-09-28 ENCOUNTER — Ambulatory Visit: Payer: PPO

## 2020-10-11 ENCOUNTER — Ambulatory Visit: Payer: PPO | Attending: Pain Medicine | Admitting: Pain Medicine

## 2020-10-11 ENCOUNTER — Telehealth: Payer: Self-pay | Admitting: Pain Medicine

## 2020-10-11 ENCOUNTER — Other Ambulatory Visit: Payer: Self-pay

## 2020-10-11 ENCOUNTER — Other Ambulatory Visit: Payer: Self-pay | Admitting: Pain Medicine

## 2020-10-11 DIAGNOSIS — Z79899 Other long term (current) drug therapy: Secondary | ICD-10-CM

## 2020-10-11 DIAGNOSIS — G8929 Other chronic pain: Secondary | ICD-10-CM | POA: Diagnosis not present

## 2020-10-11 DIAGNOSIS — F1120 Opioid dependence, uncomplicated: Secondary | ICD-10-CM | POA: Diagnosis not present

## 2020-10-11 DIAGNOSIS — M1612 Unilateral primary osteoarthritis, left hip: Secondary | ICD-10-CM | POA: Diagnosis not present

## 2020-10-11 DIAGNOSIS — M545 Low back pain, unspecified: Secondary | ICD-10-CM | POA: Diagnosis not present

## 2020-10-11 DIAGNOSIS — G894 Chronic pain syndrome: Secondary | ICD-10-CM

## 2020-10-11 DIAGNOSIS — M47816 Spondylosis without myelopathy or radiculopathy, lumbar region: Secondary | ICD-10-CM

## 2020-10-11 DIAGNOSIS — M25552 Pain in left hip: Secondary | ICD-10-CM

## 2020-10-11 DIAGNOSIS — M48062 Spinal stenosis, lumbar region with neurogenic claudication: Secondary | ICD-10-CM

## 2020-10-11 MED ORDER — OXYCODONE HCL 5 MG PO TABS: 5.0000 mg | ORAL_TABLET | Freq: Three times a day (TID) | ORAL | 0 refills | Status: DC | PRN

## 2020-10-11 NOTE — Telephone Encounter (Signed)
Patient only got 30 tablets when she picked up meds and is now almost out of meds. The pharmacy told her it was correct and had to last 30 days. She is asking if a script for the other 30 ?

## 2020-10-11 NOTE — Telephone Encounter (Signed)
Spoke with patient and tried to decipher what is going on with medications.  Called [facility] and she lasted filled on 09/17/20 and received on 09/19/20.  She has no existing prescriptions left.  Went to Dr Laban Emperor about the issue and he asked to have her added to his VV schedule for today 10/11/20.  Done and MR completed.

## 2020-10-11 NOTE — Progress Notes (Signed)
Patient: Cynthia Bennett  Service Category: E/M  Provider: Gaspar Cola, MD  DOB: 1933/05/15  DOS: 10/11/2020  Location: Office  MRN: [id number]  Setting: Ambulatory outpatient  Referring Provider: [name], MD  Type: Established Patient  Specialty: Interventional Pain Management  PCP: [name], MD  Location: Remote location  Delivery: TeleHealth     Virtual Encounter - Pain Management PROVIDER NOTE: Information contained herein reflects review and annotations entered in association with encounter. Interpretation of such information and data should be left to medically-trained personnel. Information provided to patient can be located elsewhere in the medical record under "Patient Instructions". Document created using STT-dictation technology, any transcriptional errors that may result from process are unintentional.    Contact & Pharmacy Preferred: [place]: [phone] (home) Mobile: There is no such number on file (mobile). E-mail: [email or web address]  [id number] - [place], [address] [place] [address] Phone: [phone] Fax: [phone]   Pre-screening  Cynthia Bennett offered "in-person" vs "virtual" encounter. She indicated preferring virtual for this encounter.   Reason COVID-19*  Social distancing based on CDC and AMA recommendations.   I contacted Cynthia Bennett on 10/11/2020 via telephone.      I clearly identified myself as Gaspar Cola, MD. I verified that I was speaking with the correct person using two identifiers (Name: Cynthia Bennett, and date of birth: [birth year]).  Consent I sought verbal advanced consent from Cynthia Bennett for virtual visit interactions. I informed Cynthia Bennett of possible security and privacy concerns, risks, and limitations associated with providing "not-in-person" medical evaluation and management services. I also informed Cynthia Bennett of the availability of "in-person" appointments. Finally, I informed  her that there would be a charge for the virtual visit and that she could be  personally, fully or partially, financially responsible for it. Cynthia Bennett expressed understanding and agreed to proceed.   Historic Elements   Cynthia Bennett is a 86 y.o. year old, female patient evaluated today after our last contact on 10/11/2020. Cynthia Bennett  has a past medical history of Dizzy spells, Fibromyalgia, Glaucoma, Hiatal hernia, Osteoarthritis, Osteoarthritis of spine with radiculopathy, lumbar region (08/14/2015), Peptic ulcer disease, Reflux, Spinal stenosis, Stroke ([place]), and TIA (transient ischemic attack). She also  has a past surgical history that includes Esophagogastroduodenoscopy (N/A, 04/16/2015); Appendectomy; and Abdominal hysterectomy. Cynthia Bennett has a current medication list which includes the following prescription(s): acetaminophen, bupropion, vitamin d3, cyanocobalamin, doxepin, esomeprazole, cvs slow release iron, multivitamin, oxycodone, pantoprazole, and zolpidem. She  reports that she has quit smoking. She has never used smokeless tobacco. She reports that she does not drink alcohol and does not use drugs. Cynthia Bennett is allergic to ibuprofen.   HPI  Today, she is being contacted for medication management.  The patient contacted the clinic indicating that she was about to run out of pain medicine.  My last encounter with her was a virtual visit on 05/08/2020 to evaluate the results of a recent right hip injection into the area of the trochanteric bursa, the ischiofemoral ligament, and the piriformis muscle.  According to the patient she attained 100% relief of the pain for the duration of the local anesthetic and then he went down to a 75% relief that continued after that.  Today she refers that her right hip pain has actually completely gone away and she has absolutely no problems with that right hip.  On that visit on 05/08/2020 I provided the  patient with 2 prescriptions for oxycodone IR 5 mg # 60 tablets  to last until 07/07/2020.  She was supposed to have a follow-up appointment on 07/02/2020 for medication refill.  However, it turns out that I was out on vacation during that time and she was seen by Dr. Holley Raring however, he held the on refilling the medication that day due to the fact that she had another prescription at the pharmacy that she had not gotten filled.  However, unknown to him was the fact that the prescriptions that she had left in the pharmacy included 1 from 04/04/2020, when we had not yet increased her pain medicine.  This prescription was only for 30 pills.  Review of the PMP reveals that she had that prescription picked up on 09/17/2020 and today she only has 3 pills left.  She was hoping to accompany her son to their beach house, but apparently he told her that if she did not have her pain medicine, he did not really want to take her because she would probably be suffering.  Today we had a long conversation with regards to her medications and she indicates that she has been taking her 2 pills/day and she usually takes the first 1 at 8 AM with all of her other medicines a takes approximately 1-1/2 hours until she can tell that she has taken the pill.  She also indicates that it seems to last until approximately 3 PM (7 hours).  She will then hold until later in the day to take the second pill.  She indicates that she is not able to move around as well because of the back pain but she also has been having some problems with anemia due to low iron.  She has received some iron infusions at the hematology/oncology department and she refers feeling much better.  She denies any type of side effects with the medication.  She denies that the medicine makes her sleepy or unstable.  She indicated having absolutely no problems with the medicine.  In view of this, today I talked to her about the possibility of taking the medicine every 8 hours to see if she can feel better and do more.  She was agreeable with  this sent today I have sent a prescription to her pharmacy for the oxycodone IR 5 mg every 8 hours #90.  She has an appointment to see me on 10/22/2020 for follow-up.  At that time we will check to see how she is doing on the new regimen.  In addition to this, she indicates having increase pain in the left lower back which she thinks it is new because it is above where she normally has her left-sided low back pain.  She is also having an increase in the left hip pain.  Based on the information provided by the patient to me today, I have decided to order an updated x-ray of her lumbar spine on flexion and extension as well as x-rays of her left hip.  At age 77 she can very easily develop problems with her back and weightbearing joints.  The plan was shared with the patient who understood and accepted.  Pharmacotherapy Assessment  Analgesic: Oxycodone IR 5 mg, 1 tab PO 3 times daily (maximum of 15 mg/day of oxycodone.) MME/day:  22.5 mg/day.   Monitoring: [place] PMP: PDMP reviewed during this encounter.       Pharmacotherapy: No side-effects or adverse reactions reported. Compliance: No problems identified. Effectiveness: Clinically acceptable. Plan: Refer  to "POC".  UDS:  Summary  Date Value Ref Range Status  04/10/2020 Note  Final    Comment:    ==================================================================== [employer] Select 13 (MW) ==================================================================== Test                             Result       Flag       Units Drug Present and Declared for Prescription Verification   Oxycodone                      919          EXPECTED   ng/mg creat   Oxymorphone                    57           EXPECTED   ng/mg creat   Noroxycodone                   3286         EXPECTED   ng/mg creat    Sources of oxycodone include scheduled prescription medications.    Oxymorphone and noroxycodone are expected metabolites of oxycodone.    Oxymorphone is also  available as a scheduled prescription medication. ==================================================================== Test                      Result    Flag   Units      Ref Range   Creatinine              168              mg/dL      >=20 ==================================================================== Declared Medications:  The flagging and interpretation on this report are based on the  following declared medications.  Unexpected results may arise from  inaccuracies in the declared medications.  **Note: The testing scope of this panel includes these medications:  Oxycodone (Roxicodone)  **Note: The testing scope of this panel does not include the  following reported medications:  Acetaminophen (Tylenol)  Bupropion (Wellbutrin SR)  Dorzolamide  Doxepin  Esomeprazole (Nexium)  Iron  Multivitamin  Pantoprazole (Protonix)  Sertraline (Zoloft)  Timolol  Vitamin D3  Zolpidem (Ambien) ==================================================================== For clinical consultation, please call [phone]. ====================================================================     Laboratory Chemistry Profile   Renal Lab Results  Component Value Date   BUN 14 01/10/2019   CREATININE 1.26 (H) 01/10/2019   BCR 11 (L) 01/10/2019   GFRAA 46 (L) 01/10/2019   GFRNONAA 39 (L) 01/10/2019     Hepatic Lab Results  Component Value Date   AST 18 01/10/2019   ALT 11 (L) 06/24/2016   ALBUMIN 4.3 01/10/2019   ALKPHOS 74 01/10/2019   LIPASE 139 04/27/2014     Electrolytes Lab Results  Component Value Date   NA 141 01/10/2019   K 4.7 01/10/2019   CL 104 01/10/2019   CALCIUM 9.5 01/10/2019   MG 2.3 01/10/2019     Bone Lab Results  Component Value Date   25OHVITD1 26 (L) 01/10/2019   25OHVITD2 1.6 01/10/2019   25OHVITD3 24 01/10/2019     Inflammation (CRP: Acute Phase) (ESR: Chronic Phase) Lab Results  Component Value Date   CRP 0.9 08/24/2020   ESRSEDRATE 51 (H)  01/10/2019       Note: Above Lab results reviewed.  Imaging  DG PAIN CLINIC C-ARM 1-60 MIN NO REPORT  Fluoro was used, but no Radiologist interpretation will be provided.  Please refer to "NOTES" tab for provider progress note.  Assessment  The primary encounter diagnosis was Chronic pain syndrome. Diagnoses of Chronic hip pain (Left), Chronic low back pain (Left) w/o sciatica, Lumbar facet syndrome (Bilateral) (L>R), Lumbar facet hypertrophy (Bilateral), Osteoarthritis of hip (Left), Severe (5-6 mm) L4-5 lumbar spinal stenosis and (8 mm) L3-4 spinal stenosis., Pharmacologic therapy, and Uncomplicated opioid dependence ([place]) were also pertinent to this visit.  Plan of Care  Problem-specific:  No problem-specific Assessment & Plan notes found for this encounter.  Cynthia Bennett has a current medication list which includes the following long-term medication(s): bupropion, doxepin, cvs slow release iron, oxycodone, pantoprazole, and zolpidem.  Pharmacotherapy (Medications Ordered): Meds ordered this encounter  Medications  . oxyCODONE (OXY IR/ROXICODONE) 5 MG immediate release tablet    Sig: Take 1 tablet (5 mg total) by mouth every 8 (eight) hours as needed for severe pain. Must last 30 days    Dispense:  90 tablet    Refill:  0    Chronic Pain: STOP Act (Not applicable) Fill 1 day early if closed on refill date. Avoid benzodiazepines within 8 hours of opioids   Orders:  Orders Placed This Encounter  Procedures  . DG Lumbar Spine Complete W/Bend    Patient presents with axial pain with possible radicular component.  In addition to any acute findings, please report on:  1. Facet (Zygapophyseal) joint DJD (Hypertrophy, space narrowing, subchondral sclerosis, and/or osteophyte formation) 2. DDD and/or IVDD (Loss of disc height, desiccation or "Black disc disease") 3. Pars defects 4. Spondylolisthesis, spondylosis, and/or spondyloarthropathies (include Degree/Grade of displacement in  mm) 5. Vertebral body Fractures, including age (old, new/acute) 80. Modic Type Changes 7. Demineralization 8. Bone pathology 9. Central, Lateral Recess, and/or Foraminal Stenosis (include AP diameter of stenosis in mm) 10. Surgical changes (hardware type, status, and presence of fibrosis)  NOTE: Please specify level(s) and laterality. If applicable: Please indicate ROM and/or evidence of instability (>24m displacement between flexion and extension views)    Standing Status:   Future    Standing Expiration Date:   11/11/2020    Scheduling Instructions:     Imaging must be done as soon as possible. Inform patient that order will expire within 30 days and I will not renew it.    Order Specific Question:   Reason for Exam (SYMPTOM  OR DIAGNOSIS REQUIRED)    Answer:   Low back pain    Order Specific Question:   Preferred imaging location?    Answer:   [facility]    Order Specific Question:   Call Results- Best Contact Number?    Answer:   [phone]([facility]    Order Specific Question:   Radiology Contrast Protocol - do NOT remove file path    Answer:   [email or web address]    Order Specific Question:   Release to patient    Answer:   Immediate  . DG HIP UNILAT W OR W/O PELVIS 2-3 VIEWS LEFT    Please describe any evidence of DJD, such as joint narrowing, asymmetry, cysts, or any anomalies in bone density, production, or erosion.    Standing Status:   Future    Standing Expiration Date:   11/11/2020    Scheduling Instructions:     Imaging must be done as soon as possible. Inform patient that order will expire within 30 days and I will not renew it.  Order Specific Question:   Reason for Exam (SYMPTOM  OR DIAGNOSIS REQUIRED)    Answer:   Left hip pain/arthralgia    Order Specific Question:   Preferred imaging location?    Answer:   [facility]    Order Specific Question:   Call Results- Best Contact Number?    Answer:   [phone]  [phone] [facility])   Follow-up plan:   Return in about 11 days (around 10/22/2020) for scheduled encounter to evaluate low back pain and hip pain.      Interventional management options:  Considering: Diagnostic right ischial bursa injection #1  Possible bilateral lumbar facet RFA Possible bilateral sacroiliac joint RFA Diagnostic left L4 TFESI  Diagnostic left IA hip joint injection  Diagnostic bilateral femoral nerve + obturator NB Possible bilateral femoral nerve + obturator nerve RFA   Palliative PRN treatment(s): Palliative left L4-5LESI #6  Diagnostic right L4-5 LESI #2  Diagnostic right L5-S1 LESI #2  Diagnostic left L5-S1 LESI #3  Palliative left IA shoulder joint injection #2  Diagnosticleft AC joint injection + MNB (L) Biceps muscle #2  Palliative right SI joint block #3  Therapeutic right trochanteric bursa injection #2  Therapeutic right ischiofemoral ligament injection #2  Therapeutic right piriformis muscle trigger point injection #2 Therapeutic right IA hip joint injection #2      Recent Visits No visits were found meeting these conditions. Showing recent visits within past 90 days and meeting all other requirements Today's Visits Date Type Provider Dept  10/11/20 Telemedicine Milinda Pointer, MD [facility]  Showing today's visits and meeting all other requirements Future Appointments Date Type Provider Dept  10/22/20 Appointment Milinda Pointer, MD [facility]  Showing future appointments within next 90 days and meeting all other requirements  I discussed the assessment and treatment plan with the patient. The patient was provided an opportunity to ask questions and all were answered. The patient agreed with the plan and demonstrated an understanding of the instructions.  Patient advised to call back or seek an in-person evaluation if the symptoms or condition worsens.  Duration of encounter: 25  minutes.  Note by: Gaspar Cola, MD Date: 10/11/2020; Time: 7:19 PM

## 2020-10-15 ENCOUNTER — Ambulatory Visit
Admission: RE | Admit: 2020-10-15 | Discharge: 2020-10-15 | Disposition: A | Discharge status: Home / Self Care | Payer: PPO | Source: Ambulatory Visit | Attending: Pain Medicine | Admitting: Pain Medicine

## 2020-10-15 ENCOUNTER — Ambulatory Visit
Admission: RE | Admit: 2020-10-15 | Discharge: 2020-10-15 | Disposition: A | Discharge status: Home / Self Care | Payer: PPO | Attending: Pain Medicine | Admitting: Pain Medicine

## 2020-10-15 DIAGNOSIS — M1612 Unilateral primary osteoarthritis, left hip: Secondary | ICD-10-CM | POA: Diagnosis not present

## 2020-10-15 DIAGNOSIS — G8929 Other chronic pain: Secondary | ICD-10-CM

## 2020-10-15 DIAGNOSIS — M545 Low back pain, unspecified: Secondary | ICD-10-CM

## 2020-10-15 DIAGNOSIS — M47816 Spondylosis without myelopathy or radiculopathy, lumbar region: Secondary | ICD-10-CM | POA: Insufficient documentation

## 2020-10-15 DIAGNOSIS — M419 Scoliosis, unspecified: Secondary | ICD-10-CM | POA: Diagnosis not present

## 2020-10-15 DIAGNOSIS — M48062 Spinal stenosis, lumbar region with neurogenic claudication: Secondary | ICD-10-CM | POA: Diagnosis not present

## 2020-10-15 DIAGNOSIS — M4316 Spondylolisthesis, lumbar region: Secondary | ICD-10-CM | POA: Diagnosis not present

## 2020-10-15 DIAGNOSIS — M25552 Pain in left hip: Secondary | ICD-10-CM | POA: Insufficient documentation

## 2020-10-19 ENCOUNTER — Inpatient Hospital Stay: Payer: PPO

## 2020-10-19 ENCOUNTER — Other Ambulatory Visit: Payer: Self-pay

## 2020-10-19 ENCOUNTER — Inpatient Hospital Stay: Payer: PPO | Attending: Internal Medicine

## 2020-10-19 ENCOUNTER — Inpatient Hospital Stay (HOSPITAL_BASED_OUTPATIENT_CLINIC_OR_DEPARTMENT_OTHER): Payer: PPO | Admitting: Internal Medicine

## 2020-10-19 VITALS — BP 160/81 | HR 92 | Temp 97.0°F | Resp 20 | Ht 62.0 in | Wt 158.0 lb

## 2020-10-19 VITALS — BP 133/83 | HR 82

## 2020-10-19 DIAGNOSIS — Z79899 Other long term (current) drug therapy: Secondary | ICD-10-CM | POA: Diagnosis not present

## 2020-10-19 DIAGNOSIS — M47816 Spondylosis without myelopathy or radiculopathy, lumbar region: Secondary | ICD-10-CM | POA: Insufficient documentation

## 2020-10-19 DIAGNOSIS — M25552 Pain in left hip: Secondary | ICD-10-CM | POA: Diagnosis not present

## 2020-10-19 DIAGNOSIS — Z886 Allergy status to analgesic agent status: Secondary | ICD-10-CM | POA: Insufficient documentation

## 2020-10-19 DIAGNOSIS — Z818 Family history of other mental and behavioral disorders: Secondary | ICD-10-CM | POA: Diagnosis not present

## 2020-10-19 DIAGNOSIS — M549 Dorsalgia, unspecified: Secondary | ICD-10-CM | POA: Diagnosis not present

## 2020-10-19 DIAGNOSIS — N1832 Chronic kidney disease, stage 3b: Secondary | ICD-10-CM | POA: Diagnosis not present

## 2020-10-19 DIAGNOSIS — G8929 Other chronic pain: Secondary | ICD-10-CM | POA: Insufficient documentation

## 2020-10-19 DIAGNOSIS — K219 Gastro-esophageal reflux disease without esophagitis: Secondary | ICD-10-CM | POA: Diagnosis not present

## 2020-10-19 DIAGNOSIS — R5383 Other fatigue: Secondary | ICD-10-CM | POA: Insufficient documentation

## 2020-10-19 DIAGNOSIS — Z9049 Acquired absence of other specified parts of digestive tract: Secondary | ICD-10-CM | POA: Insufficient documentation

## 2020-10-19 DIAGNOSIS — D631 Anemia in chronic kidney disease: Secondary | ICD-10-CM | POA: Insufficient documentation

## 2020-10-19 DIAGNOSIS — E611 Iron deficiency: Secondary | ICD-10-CM | POA: Diagnosis not present

## 2020-10-19 DIAGNOSIS — M2550 Pain in unspecified joint: Secondary | ICD-10-CM | POA: Insufficient documentation

## 2020-10-19 DIAGNOSIS — Z8673 Personal history of transient ischemic attack (TIA), and cerebral infarction without residual deficits: Secondary | ICD-10-CM | POA: Diagnosis not present

## 2020-10-19 DIAGNOSIS — Z87891 Personal history of nicotine dependence: Secondary | ICD-10-CM | POA: Diagnosis not present

## 2020-10-19 DIAGNOSIS — D649 Anemia, unspecified: Secondary | ICD-10-CM

## 2020-10-19 DIAGNOSIS — Z836 Family history of other diseases of the respiratory system: Secondary | ICD-10-CM | POA: Insufficient documentation

## 2020-10-19 LAB — CBC WITH DIFFERENTIAL/PLATELET
Abs Immature Granulocytes: 0.02 10*3/uL (ref 0.00–0.07)
Basophils Absolute: 0.1 10*3/uL (ref 0.0–0.1)
Basophils Relative: 1 %
Eosinophils Absolute: 0.3 10*3/uL (ref 0.0–0.5)
Eosinophils Relative: 4 %
HCT: 36.2 % (ref 36.0–46.0)
Hemoglobin: 10.8 g/dL — ABNORMAL LOW (ref 12.0–15.0)
Immature Granulocytes: 0 %
Lymphocytes Relative: 28 %
Lymphs Abs: 2.5 10*3/uL (ref 0.7–4.0)
MCH: 25.5 pg — ABNORMAL LOW (ref 26.0–34.0)
MCHC: 29.8 g/dL — ABNORMAL LOW (ref 30.0–36.0)
MCV: 85.6 fL (ref 80.0–100.0)
Monocytes Absolute: 0.7 10*3/uL (ref 0.1–1.0)
Monocytes Relative: 7 %
Neutro Abs: 5.4 10*3/uL (ref 1.7–7.7)
Neutrophils Relative %: 60 %
Platelets: 372 10*3/uL (ref 150–400)
RBC: 4.23 MIL/uL (ref 3.87–5.11)
RDW: 21.2 % — ABNORMAL HIGH (ref 11.5–15.5)
WBC: 8.9 10*3/uL (ref 4.0–10.5)
nRBC: 0 % (ref 0.0–0.2)

## 2020-10-19 LAB — BASIC METABOLIC PANEL
Anion gap: 6 (ref 5–15)
BUN: 12 mg/dL (ref 8–23)
CO2: 29 mmol/L (ref 22–32)
Calcium: 9.2 mg/dL (ref 8.9–10.3)
Chloride: 103 mmol/L (ref 98–111)
Creatinine, Ser: 1.38 mg/dL — ABNORMAL HIGH (ref 0.44–1.00)
GFR, Estimated: 38 mL/min — ABNORMAL LOW (ref >60)
Glucose, Bld: 106 mg/dL — ABNORMAL HIGH (ref 70–99)
Potassium: 4.2 mmol/L (ref 3.5–5.1)
Sodium: 138 mmol/L (ref 135–145)

## 2020-10-19 MED ORDER — SODIUM CHLORIDE 0.9 % IV SOLN: 200.0000 mg | Freq: Once | INTRAVENOUS | Status: DC

## 2020-10-19 MED ORDER — ONDANSETRON HCL 8 MG PO TABS: ORAL_TABLET | ORAL | 1 refills | Status: DC

## 2020-10-19 MED ORDER — IRON SUCROSE 20 MG/ML IV SOLN
200.0000 mg | Freq: Once | INTRAVENOUS | Status: AC
  Administered 2020-10-19: 200 mg via INTRAVENOUS

## 2020-10-19 MED ORDER — SODIUM CHLORIDE 0.9 % IV SOLN
Freq: Once | INTRAVENOUS | Status: AC
  Filled 2020-10-19: qty 250

## 2020-10-19 NOTE — Progress Notes (Signed)
Patient received prescribed treatment in clinic. Tolerated well. Patient stable at discharge. 

## 2020-10-19 NOTE — Progress Notes (Signed)
[place] CONSULT NOTE  Patient Care Team: [name], MD as PCP - General (Family Medicine) Cammie Sickle, MD as Consulting Physician (Hematology and Oncology)  CHIEF COMPLAINTS/PURPOSE OF CONSULTATION: Anemia   HEMATOLOGY HISTORY  # CHRONIC NORMOCYTIC ANEMIA [since 2019-PCP- Hb 8-9; Iron sat- 5%;NO Ferritin; GFR- 43]EGD/colonoscopy-?2013-2104; IV Venofer-  #CKD stage III  HISTORY OF PRESENTING ILLNESS:  Cynthia Bennett 86 y.o.  female history of anemia secondary chronic kidney disease is here for follow-up.  Patient notes improvement of her energy levels post IV iron.  No blood in stools no black or stools.  No weight loss.  Review of Systems  Constitutional: Positive for malaise/fatigue. Negative for chills, diaphoresis and fever.  HENT: Negative for nosebleeds and sore throat.   Eyes: Negative for double vision.  Respiratory: Negative for cough, hemoptysis, sputum production, shortness of breath and wheezing.   Cardiovascular: Negative for chest pain, palpitations, orthopnea and leg swelling.  Gastrointestinal: Negative for abdominal pain, blood in stool, constipation, diarrhea, heartburn, melena, nausea and vomiting.  Genitourinary: Negative for dysuria, frequency and urgency.  Musculoskeletal: Positive for back pain and joint pain.  Skin: Negative.  Negative for itching and rash.  Neurological: Negative for dizziness, tingling, focal weakness, weakness and headaches.  Endo/Heme/Allergies: Does not bruise/bleed easily.  Psychiatric/Behavioral: Negative for depression. The patient is not nervous/anxious and does not have insomnia.     MEDICAL HISTORY:  Past Medical History:  Diagnosis Date  . Dizzy spells   . Fibromyalgia   . Glaucoma   . Hiatal hernia   . Osteoarthritis   . Osteoarthritis of spine with radiculopathy, lumbar region 08/14/2015  . Peptic ulcer disease   . Reflux   . Spinal stenosis   . Stroke ([place])   . TIA (transient ischemic  attack)     SURGICAL HISTORY: Past Surgical History:  Procedure Laterality Date  . ABDOMINAL HYSTERECTOMY    . APPENDECTOMY    . ESOPHAGOGASTRODUODENOSCOPY N/A 04/16/2015   Procedure: ESOPHAGOGASTRODUODENOSCOPY (EGD);  Surgeon: Hulen Luster, MD;  Location: [facility] ENDOSCOPY;  Service: Gastroenterology;  Laterality: N/A;    SOCIAL HISTORY: Social History   Socioeconomic History  . Marital status: Widowed    Spouse name: Not on file  . Number of children: Not on file  . Years of education: Not on file  . Highest education level: Not on file  Occupational History  . Not on file  Tobacco Use  . Smoking status: Former Research scientist (life sciences)  . Smokeless tobacco: Never Used  Substance and Sexual Activity  . Alcohol use: No    Alcohol/week: 0.0 standard drinks  . Drug use: No  . Sexual activity: Not on file  Other Topics Concern  . Not on file  Social History Narrative   Pt lives in [place]; with son. Quit smoking in 1957. No alcohol. Designed clothes/ alterations.    Social Determinants of Health   Financial Resource Strain: Not on file  Food Insecurity: Not on file  Transportation Needs: Not on file  Physical Activity: Not on file  Stress: Not on file  Social Connections: Not on file  Intimate Partner Violence: Not on file    FAMILY HISTORY: Family History  Problem Relation Age of Onset  . Mental illness Mother   . COPD Father     ALLERGIES:  is allergic to ibuprofen.  MEDICATIONS:  Current Outpatient Medications  Medication Sig Dispense Refill  . acetaminophen (TYLENOL) 325 MG tablet Take 650 mg by mouth every 6 (six) hours  as needed.    Marland Kitchen buPROPion (WELLBUTRIN SR) 150 MG 12 hr tablet TAKE 1 TABLET BY MOUTH ONCE DAILY    . Cholecalciferol (VITAMIN D3) 20 MCG (800 UNIT) TABS Take by mouth 2 (two) times daily.    . cyanocobalamin (,VITAMIN B-12,) 1000 MCG/ML injection Inject 1,000 mcg into the muscle every 30 (thirty) days.    Marland Kitchen doxepin (SINEQUAN) 100 MG capsule Take 100 mg by mouth  at bedtime.    Marland Kitchen esomeprazole (NEXIUM) 40 MG capsule Take 40 mg by mouth 2 (two) times daily.    . Ferrous Sulfate ([facility]S SLOW RELEASE IRON) 143 (45 Fe) MG TBCR Take by mouth daily.    . Multiple Vitamin (MULTIVITAMIN) tablet Take 1 tablet by mouth daily.    Marland Kitchen oxyCODONE (OXY IR/ROXICODONE) 5 MG immediate release tablet Take 1 tablet (5 mg total) by mouth every 8 (eight) hours as needed for severe pain. Must last 30 days 90 tablet 0  . pantoprazole (PROTONIX) 40 MG tablet Take 1 tablet by mouth 2 (two) times daily.  11  . zolpidem (AMBIEN) 10 MG tablet TAKE 1/2 TO 1 TABLET BY MOUTH EVERY NIGHT AT BEDTIME    . ondansetron (ZOFRAN) 8 MG tablet One pill every 8 hours as needed for nausea/vomitting. 40 tablet 1   No current facility-administered medications for this visit.      PHYSICAL EXAMINATION:   Vitals:   10/19/20 1307  BP: (!) 160/81  Pulse: 92  Resp: 20  Temp: (!) 97 F (36.1 C)   Filed Weights   10/19/20 1307  Weight: 158 lb (71.7 kg)    Physical Exam HENT:     Head: Normocephalic and atraumatic.     Mouth/Throat:     Pharynx: No oropharyngeal exudate.  Eyes:     Pupils: Pupils are equal, round, and reactive to light.  Cardiovascular:     Rate and Rhythm: Normal rate and regular rhythm.  Pulmonary:     Effort: Pulmonary effort is normal. No respiratory distress.     Breath sounds: Normal breath sounds. No wheezing.  Abdominal:     General: Bowel sounds are normal. There is no distension.     Palpations: Abdomen is soft. There is no mass.     Tenderness: There is no abdominal tenderness. There is no guarding or rebound.  Musculoskeletal:        General: No tenderness. Normal range of motion.     Cervical back: Normal range of motion and neck supple.  Skin:    General: Skin is warm.  Neurological:     Mental Status: She is alert and oriented to person, place, and time.  Psychiatric:        Mood and Affect: Affect normal.     LABORATORY DATA:  I have reviewed  the data as listed Lab Results  Component Value Date   WBC 8.9 10/19/2020   HGB 10.8 (L) 10/19/2020   HCT 36.2 10/19/2020   MCV 85.6 10/19/2020   PLT 372 10/19/2020   Recent Labs    10/19/20 1238  NA 138  K 4.2  CL 103  CO2 29  GLUCOSE 106*  BUN 12  CREATININE 1.38*  CALCIUM 9.2  GFRNONAA 38*     DG Lumbar Spine Complete W/Bend  Result Date: 10/15/2020 CLINICAL DATA:  Low back pain EXAM: LUMBAR SPINE - COMPLETE WITH BENDING VIEWS COMPARISON:  None. FINDINGS: Levoscoliosis. Transitional anatomy at the lumbosacral junction with 6 non rib-bearing lumbar type vertebral bodies. There is  grade 1 anterolisthesis at L5-L6. No substantial change with flexion or extension. Multilevel disc space narrowing and facet hypertrophy. IMPRESSION: Multilevel lumbar spondylosis. No significant change in alignment with flexion or extension. Electronically Signed   By: Macy Mis M.D.   On: 10/15/2020 15:34   DG HIP UNILAT W OR W/O PELVIS 2-3 VIEWS LEFT  Result Date: 10/15/2020 CLINICAL DATA:  Chronic left hip pain. EXAM: DG HIP (WITH OR WITHOUT PELVIS) 2-3V LEFT COMPARISON:  Pelvic and right hip radiographs 07/19/2018 FINDINGS: No acute fracture or hip dislocation is identified. Left hip joint space width is preserved. Minimal right acetabular spurring is again noted. Transitional lumbosacral anatomy and lumbar levoscoliosis are partially visualized. IMPRESSION: No evidence of acute osseous abnormality or significant left hip arthropathy. Electronically Signed   By: Logan Bores M.D.   On: 10/15/2020 15:29    Anemia due to stage 3b chronic kidney disease ([place]) # Anemia iron def/CKD stageIII : Hb-8-9 [MCV- 77] s/p IV iron infusion hemoglobin up to 9.9.  Patient notes significant improvement of her fatigue post infusions.  Proceed with Venofer today.  #Discussed the possible need for bone marrow biopsy if iron infusions do not improve her hemoglobin.  Patient is not interested in any aggressive  work-up.  # Chronic back pain/? scaitica [Dr.Neivera]-stable.  # DISPOSITION: # Venofer today # follow up in 3 months- MD; labs- cbc/bmp/iron studies/ferritin; possible IVvenofer- Dr.B  Cc; Dr.Hedrick     All questions were answered. The patient knows to call the clinic with any problems, questions or concerns.      Cammie Sickle, MD 10/19/2020 3:16 PM

## 2020-10-19 NOTE — Assessment & Plan Note (Addendum)
#  Anemia iron def/CKD stageIII : Hb-8-9 [MCV- 77] s/p IV iron infusion hemoglobin up to 9.9.  Patient notes significant improvement of her fatigue post infusions.  Proceed with Venofer today.  #Discussed the possible need for bone marrow biopsy if iron infusions do not improve her hemoglobin.  Patient is not interested in any aggressive work-up.  # Chronic back pain/? scaitica [Dr.Neivera]-stable.  # DISPOSITION: # Venofer today # follow up in 3 months- MD; labs- cbc/bmp/iron studies/ferritin; possible IVvenofer- Dr.B  Cc; Dr.Hedrick

## 2020-10-21 NOTE — Progress Notes (Signed)
PROVIDER NOTE: Information contained herein reflects review and annotations entered in association with encounter. Interpretation of such information and data should be left to medically-trained personnel. Information provided to patient can be located elsewhere in the medical record under "Patient Instructions". Document created using STT-dictation technology, any transcriptional errors that may result from process are unintentional.    Patient: Cynthia Bennett  Service Category: E/M  Provider: Gaspar Cola, MD  DOB: Sep 22, 1933  DOS: 10/22/2020  Specialty: Interventional Pain Management  MRN: [id number]  Setting: Ambulatory outpatient  PCP: [name], MD  Type: Established Patient    Referring Provider: [name], MD  Location: Office  Delivery: Face-to-face     HPI  [name], a 86 y.o. year old female, is here today because of her Chronic pain syndrome [G89.4]. Ms. Trew primary complain today is Back Pain (Lumbar bilateral ) Last encounter: My last encounter with her was on 10/11/2020. Pertinent problems: Ms. Kendrix has Fibromyalgia; Chronic pain syndrome; Lumbar radicular pain (Bilateral) (R>L); Chronic low back pain (1ry area of Pain) (Bilateral) (L>R) w/o sciatica; Grade 1 spondylolisthesis of L4 over L5; Lumbar discogenic pain syndrome; Lumbar facet syndrome (Bilateral) (L>R); Lumbar facet hypertrophy (Bilateral); Chronic sacroiliac joint pain (Left); Chronic hip pain (Left); Lumbar spondylosis; Abnormal MRI, lumbar spine (06/25/2016); Cervical spondylosis (Anterolisthesis of C5 over C6); Chronic lower extremity pain (2ry area of Pain) (Left); Chronic lumbar radicular pain (Left) (L5 Dermatome); Osteoarthritis of hip (Left); Chronic sacroiliac joint pain (Bilateral) (R>L); Chronic shoulder pain (Left); Osteoarthritis of shoulder (Left); Chronic sacroiliac joint pain (Right); Chronic hip pain (Right); Left upper arm pain; Myofascial pain syndrome (left upper extremity); Pain in left  acromioclavicular joint; Osteoarthritis of left AC (acromioclavicular) joint; Supraspinatus tendon tear, left, sequela; Spondylosis without myelopathy or radiculopathy, lumbosacral region; Other specified dorsopathies, sacral and sacrococcygeal region; Bertolotti's syndrome (Left); DDD (degenerative disc disease), lumbar; Severe (5-6 mm) L4-5 lumbar spinal stenosis and (8 mm) L3-4 spinal stenosis.; Osteoarthritis of hip (Right); Trigger point with back pain (Right); Chronic myofascial pain; Chronic low back pain (Right) w/o sciatica; Chronic hip pain (3ry area of Pain) (Bilateral) (R>L); Osteoarthritis of hips (Bilateral); and Chronic low back pain (Left) w/o sciatica on their pertinent problem list. Pain Assessment: Severity of Chronic pain is reported as a 7 /10. Location: Back Lower/denies. Onset: More than a month ago. Quality: Discomfort,Constant,Sore. Timing: Constant. Modifying factor(s): pain medication helps and takes the edge off.. Vitals:  height is $RemoveB'5\' 2"'oiJIodbJ$  (1.575 m) and weight is 158 lb (71.7 kg). Her temporal temperature is 97.5 F (36.4 C) (abnormal). Her blood pressure is 144/98 (abnormal) and her pulse is 112 (abnormal). Her respiration is 16 and oxygen saturation is 96%.   Reason for encounter: medication management.  The patient indicates doing well with the current medication regimen. No adverse reactions or side effects reported to the medications. PMP & UDS compliant. The patient was started on oxycodone IR 5 mg every 8 hours as needed on 10/11/2020. However, she comes into the clinic today with her daughter and she indicates that she is really not taking the medicine like she should. She apparently has been taking 1 or 2 tablets daily except for yesterday when the pain was so bad that she had to take it 3 times daily. She comes in today indicating that she is got a new pain in the lower back that does not seem to be responding to these medicines. She pointed out the pain around the PSIS,  bilaterally. Physical exam demonstrated  facet arthralgia, bilaterally, upon doing hyperextension and rotation maneuvers. She had a positive bilateral Lynelle Smoke test for facet joint syndrome. X-rays of the lumbar spine confirm that she does have a grade 1 anterolisthesis of L5 over L6. It also demonstrated the patient to have Lumbar facet hypertrophy. Today we will go ahead and schedule her to come back as soon as possible for a diagnostic/therapeutic bilateral lumbar facet block under fluoroscopic guidance and IV sedation. Today I have also renewed her oxycodone prescriptions that I have encouraged the patient not to suffer with pain when she has the means at home to treated. He understood and accepted.  RTCB: 01/09/2021  Pharmacotherapy Assessment   Analgesic: Oxycodone IR 5 mg, 1 tab PO 3 times daily (maximum of 15 mg/day of oxycodone.) MME/day:  22.5 mg/day.   Monitoring: [place] PMP: PDMP reviewed during this encounter.       Pharmacotherapy: No side-effects or adverse reactions reported. Compliance: No problems identified. Effectiveness: Clinically acceptable.  [name], RN  10/22/2020  2:30 PM  Sign when Signing Visit Nursing Pain Medication Assessment:  Safety precautions to be maintained throughout the outpatient stay will include: orient to surroundings, keep bed in low position, maintain call bell within reach at all times, provide assistance with transfer out of bed and ambulation.  Medication Inspection Compliance: Ms. Wilinski did not comply with our request to bring her pills to be counted. She was reminded that bringing the medication bottles, even when empty, is a requirement.  Medication: None brought in. Pill/Patch Count: None available to be counted. Bottle Appearance: No container available. Did not bring bottle(s) to appointment. Filled Date: N/A Last Medication intake:  Today    UDS:  Summary  Date Value Ref Range Status  04/10/2020 Note  Final    Comment:     ==================================================================== [employer] [facility] (MW) ==================================================================== Test                             Result       Flag       Units Drug Present and Declared for Prescription Verification   Oxycodone                      919          EXPECTED   ng/mg creat   Oxymorphone                    57           EXPECTED   ng/mg creat   Noroxycodone                   3286         EXPECTED   ng/mg creat    Sources of oxycodone include scheduled prescription medications.    Oxymorphone and noroxycodone are expected metabolites of oxycodone.    Oxymorphone is also available as a scheduled prescription medication. ==================================================================== Test                      Result    Flag   Units      Ref Range   Creatinine              168              mg/dL      >=20 ==================================================================== Declared Medications:  The flagging and interpretation on this report  are based on the  following declared medications.  Unexpected results may arise from  inaccuracies in the declared medications.  **Note: The testing scope of this panel includes these medications:  Oxycodone (Roxicodone)  **Note: The testing scope of this panel does not include the  following reported medications:  Acetaminophen (Tylenol)  Bupropion (Wellbutrin SR)  Dorzolamide  Doxepin  Esomeprazole (Nexium)  Iron  Multivitamin  Pantoprazole (Protonix)  Sertraline (Zoloft)  Timolol  Vitamin D3  Zolpidem (Ambien) ==================================================================== For clinical consultation, please call [phone]. ====================================================================      ROS  Constitutional: Denies any fever or chills Gastrointestinal: No reported hemesis, hematochezia, vomiting, or acute GI distress Musculoskeletal: Denies  any acute onset joint swelling, redness, loss of ROM, or weakness Neurological: No reported episodes of acute onset apraxia, aphasia, dysarthria, agnosia, amnesia, paralysis, loss of coordination, or loss of consciousness  Medication Review  Ferrous Sulfate, Vitamin D3, acetaminophen, buPROPion, cyanocobalamin, doxepin, esomeprazole, multivitamin, ondansetron, oxyCODONE, pantoprazole, and zolpidem  History Review  Allergy: Ms. Miranda is allergic to ibuprofen. Drug: Ms. Rozman  reports no history of drug use. Alcohol:  reports no history of alcohol use. Tobacco:  reports that she has quit smoking. She has never used smokeless tobacco. Social: Ms. Leis  reports that she has quit smoking. She has never used smokeless tobacco. She reports that she does not drink alcohol and does not use drugs. Medical:  has a past medical history of Dizzy spells, Fibromyalgia, Glaucoma, Hiatal hernia, Osteoarthritis, Osteoarthritis of spine with radiculopathy, lumbar region (08/14/2015), Peptic ulcer disease, Reflux, Spinal stenosis, Stroke (HCC), and TIA (transient ischemic attack). Surgical: Ms. Matney  has a past surgical history that includes Esophagogastroduodenoscopy (N/A, 04/16/2015); Appendectomy; and Abdominal hysterectomy. Family: family history includes COPD in her father; Mental illness in her mother.  Laboratory Chemistry Profile   Renal Lab Results  Component Value Date   BUN 12 10/19/2020   CREATININE 1.38 (H) 10/19/2020   BCR 11 (L) 01/10/2019   GFRAA 46 (L) 01/10/2019   GFRNONAA 38 (L) 10/19/2020     Hepatic Lab Results  Component Value Date   AST 18 01/10/2019   ALT 11 (L) 06/24/2016   ALBUMIN 4.3 01/10/2019   ALKPHOS 74 01/10/2019   LIPASE 139 04/27/2014     Electrolytes Lab Results  Component Value Date   NA 138 10/19/2020   K 4.2 10/19/2020   CL 103 10/19/2020   CALCIUM 9.2 10/19/2020   MG 2.3 01/10/2019     Bone Lab Results  Component Value Date   25OHVITD1 26 (L) 01/10/2019    25OHVITD2 1.6 01/10/2019   25OHVITD3 24 01/10/2019     Inflammation (CRP: Acute Phase) (ESR: Chronic Phase) Lab Results  Component Value Date   CRP 0.9 08/24/2020   ESRSEDRATE 51 (H) 01/10/2019       Note: Above Lab results reviewed.  Recent Imaging Review  DG Lumbar Spine Complete W/Bend CLINICAL DATA:  Low back pain  EXAM: LUMBAR SPINE - COMPLETE WITH BENDING VIEWS  COMPARISON:  None.  FINDINGS: Levoscoliosis. Transitional anatomy at the lumbosacral junction with 6 non rib-bearing lumbar type vertebral bodies. There is grade 1 anterolisthesis at L5-L6. No substantial change with flexion or extension. Multilevel disc space narrowing and facet hypertrophy.  IMPRESSION: Multilevel lumbar spondylosis. No significant change in alignment with flexion or extension.  Electronically Signed   By: Guadlupe Spanish M.D.   On: 10/15/2020 15:34 DG HIP UNILAT W OR W/O PELVIS 2-3 VIEWS LEFT CLINICAL DATA:  Chronic left hip  pain.  EXAM: DG HIP (WITH OR WITHOUT PELVIS) 2-3V LEFT  COMPARISON:  Pelvic and right hip radiographs 07/19/2018  FINDINGS: No acute fracture or hip dislocation is identified. Left hip joint space width is preserved. Minimal right acetabular spurring is again noted. Transitional lumbosacral anatomy and lumbar levoscoliosis are partially visualized.  IMPRESSION: No evidence of acute osseous abnormality or significant left hip arthropathy.  Electronically Signed   By: Logan Bores M.D.   On: 10/15/2020 15:29 Note: Reviewed        Physical Exam  General appearance: Well nourished, well developed, and well hydrated. In no apparent acute distress Mental status: Alert, oriented x 3 (person, place, & time)       Respiratory: No evidence of acute respiratory distress Eyes: PERLA Vitals: BP (!) 144/98 (BP Location: Right Arm, Patient Position: Sitting, Cuff Size: Normal)   Pulse (!) 112   Temp (!) 97.5 F (36.4 C) (Temporal)   Resp 16   Ht $R'5\' 2"'Zc$  (1.575  m)   Wt 158 lb (71.7 kg)   SpO2 96%   BMI 28.90 kg/m  BMI: Estimated body mass index is 28.9 kg/m as calculated from the following:   Height as of this encounter: $RemoveBeforeD'5\' 2"'FTFXmvVxZqiHCJ$  (1.575 m).   Weight as of this encounter: 158 lb (71.7 kg). Ideal: Ideal body weight: 50.1 kg (110 lb 7.2 oz) Adjusted ideal body weight: 58.7 kg (129 lb 7.5 oz)  Assessment   Status Diagnosis  Controlled Controlled Controlled 1. Chronic pain syndrome   2. Lumbar facet syndrome (Bilateral) (L>R)   3. Lumbar facet hypertrophy (Bilateral)   4. Grade 1 spondylolisthesis of L4 over L5   5. Chronic low back pain (1ry area of Pain) (Bilateral) (L>R) w/o sciatica      Updated Problems: No problems updated.  Plan of Care  Problem-specific:  No problem-specific Assessment & Plan notes found for this encounter.  Ms. TERRIN IMPARATO has a current medication list which includes the following long-term medication(s): bupropion, doxepin, [facility] slow release iron, [START ON 12/10/2020] oxycodone, [START ON 11/10/2020] oxycodone, pantoprazole, and zolpidem.  Pharmacotherapy (Medications Ordered): Meds ordered this encounter  Medications  . DISCONTD: oxyCODONE (OXY IR/ROXICODONE) 5 MG immediate release tablet    Sig: Take 1 tablet (5 mg total) by mouth every 8 (eight) hours as needed for severe pain. Must last 30 days    Dispense:  90 tablet    Refill:  0    Chronic Pain: STOP Act (Not applicable) Fill 1 day early if closed on refill date. Avoid benzodiazepines within 8 hours of opioids  . DISCONTD: oxyCODONE (OXY IR/ROXICODONE) 5 MG immediate release tablet    Sig: Take 1 tablet (5 mg total) by mouth every 8 (eight) hours as needed for severe pain. Must last 30 days    Dispense:  90 tablet    Refill:  0    Chronic Pain: STOP Act (Not applicable) Fill 1 day early if closed on refill date. Avoid benzodiazepines within 8 hours of opioids  . oxyCODONE (OXY IR/ROXICODONE) 5 MG immediate release tablet    Sig: Take 1 tablet (5 mg  total) by mouth every 8 (eight) hours as needed for severe pain. Must last 30 days    Dispense:  90 tablet    Refill:  0    Chronic Pain: STOP Act (Not applicable) Fill 1 day early if closed on refill date. Avoid benzodiazepines within 8 hours of opioids  . oxyCODONE (OXY IR/ROXICODONE) 5 MG immediate release tablet  Sig: Take 1 tablet (5 mg total) by mouth every 8 (eight) hours as needed for severe pain. Must last 30 days    Dispense:  90 tablet    Refill:  0    Chronic Pain: STOP Act (Not applicable) Fill 1 day early if closed on refill date. Avoid benzodiazepines within 8 hours of opioids   Orders:  Orders Placed This Encounter  Procedures  . LUMBAR FACET(MEDIAL BRANCH NERVE BLOCK) MBNB    Standing Status:   Future    Standing Expiration Date:   11/22/2020    Scheduling Instructions:     Procedure: Lumbar facet block (AKA.: Lumbosacral medial branch nerve block)     Side: Bilateral     Level: L3-4, L4-5, & L5-S1 Facets (L2, L3, L4, L5, & S1 Medial Branch Nerves)     Sedation: Patient's choice.     Timeframe: ASAA    Order Specific Question:   Where will this procedure be performed?    Answer:   [facility]   Follow-up plan:   Return for Procedure (w/ sedation): (B) L-FCT BLK #1.      Interventional Therapies  Risk  Complexity Considerations:   Advanced age   Planned  Pending:   Diagnostic bilateral lumbar facet block R1L2    Under consideration:   Diagnostic right lumbar facet block #1  Diagnostic right ischial bursa injection #1  Possible bilateral lumbar facet RFA Possible bilateral sacroiliac joint RFA Diagnostic left L4 TFESI  Diagnostic left IA hip joint injection  Diagnostic bilateral femoral nerve + obturator NB Possible bilateral femoral nerve + obturator nerve RFA   Completed:   Therapeutic left L4-5 LESI x6 (04/10/2020)  Therapeutic right L4-5 LESI x1 (07/21/2017)  Palliative right L5-S1 LESI x1 (09/27/2015)  Therapeutic left L5-S1 LESI x1  (02/05/2016)  Diagnostic right SI joint injection x2 (08/25/2019)  Diagnostic left SI joint injection x2 (01/27/2019)  Diagnostic left glenohumeral joint injection x1 (04/29/2017)  Left biceps muscle trigger point injection x1 (07/21/2017)  Right PSIS trigger point injection x1 (07/27/2018)  Diagnostic left lumbar facet block x1 (05/25/2018)  Diagnostic left IA hip injection x1 (01/27/2019)    Palliative options:   None at this time    Recent Visits Date Type Provider Dept  10/11/20 Telemedicine Milinda Pointer, MD [facility]  Showing recent visits within past 90 days and meeting all other requirements Today's Visits Date Type Provider Dept  10/22/20 Office Visit Milinda Pointer, MD [facility]  Showing today's visits and meeting all other requirements Future Appointments Date Type Provider Dept  10/23/20 Appointment Milinda Pointer, MD [facility]  Showing future appointments within next 90 days and meeting all other requirements  I discussed the assessment and treatment plan with the patient. The patient was provided an opportunity to ask questions and all were answered. The patient agreed with the plan and demonstrated an understanding of the instructions.  Patient advised to call back or seek an in-person evaluation if the symptoms or condition worsens.  Duration of encounter: 30 minutes.  Note by: Gaspar Cola, MD Date: 10/22/2020; Time: 3:46 PM

## 2020-10-22 ENCOUNTER — Telehealth: Payer: Self-pay | Admitting: *Deleted

## 2020-10-22 ENCOUNTER — Encounter: Payer: Self-pay | Admitting: Pain Medicine

## 2020-10-22 ENCOUNTER — Other Ambulatory Visit: Payer: Self-pay

## 2020-10-22 ENCOUNTER — Ambulatory Visit: Payer: PPO | Attending: Pain Medicine | Admitting: Pain Medicine

## 2020-10-22 VITALS — BP 144/98 | HR 112 | Temp 97.5°F | Resp 16 | Ht 62.0 in | Wt 158.0 lb

## 2020-10-22 DIAGNOSIS — G894 Chronic pain syndrome: Secondary | ICD-10-CM

## 2020-10-22 DIAGNOSIS — G8929 Other chronic pain: Secondary | ICD-10-CM | POA: Diagnosis not present

## 2020-10-22 DIAGNOSIS — M4316 Spondylolisthesis, lumbar region: Secondary | ICD-10-CM | POA: Insufficient documentation

## 2020-10-22 DIAGNOSIS — M47816 Spondylosis without myelopathy or radiculopathy, lumbar region: Secondary | ICD-10-CM | POA: Insufficient documentation

## 2020-10-22 DIAGNOSIS — M545 Low back pain, unspecified: Secondary | ICD-10-CM

## 2020-10-22 MED ORDER — OXYCODONE HCL 5 MG PO TABS: 5.0000 mg | ORAL_TABLET | Freq: Three times a day (TID) | ORAL | 0 refills | Status: DC | PRN

## 2020-10-22 NOTE — Patient Instructions (Signed)
____________________________________________________________________________________________  Preparing for Procedure with Sedation  Procedure appointments are limited to planned procedures: . No Prescription Refills. . No disability issues will be discussed. . No medication changes will be discussed.  Instructions: . Oral Intake: Do not eat or drink anything for at least 8 hours prior to your procedure. (Exception: Blood Pressure Medication. See below.) . Transportation: Unless otherwise stated by your physician, you may drive yourself after the procedure. . Blood Pressure Medicine: Do not forget to take your blood pressure medicine with a sip of water the morning of the procedure. If your Diastolic (lower reading)is above 100 mmHg, elective cases will be cancelled/rescheduled. . Blood thinners: These will need to be stopped for procedures. Notify our staff if you are taking any blood thinners. Depending on which one you take, there will be specific instructions on how and when to stop it. . Diabetics on insulin: Notify the staff so that you can be scheduled 1st case in the morning. If your diabetes requires high dose insulin, take only  of your normal insulin dose the morning of the procedure and notify the staff that you have done so. . Preventing infections: Shower with an antibacterial soap the morning of your procedure. . Build-up your immune system: Take 1000 mg of Vitamin C with every meal (3 times a day) the day prior to your procedure. . Antibiotics: Inform the staff if you have a condition or reason that requires you to take antibiotics before dental procedures. . Pregnancy: If you are pregnant, call and cancel the procedure. . Sickness: If you have a cold, fever, or any active infections, call and cancel the procedure. . Arrival: You must be in the facility at least 30 minutes prior to your scheduled procedure. . Children: Do not bring children with you. . Dress appropriately:  Bring dark clothing that you would not mind if they get stained. . Valuables: Do not bring any jewelry or valuables.  Reasons to call and reschedule or cancel your procedure: (Following these recommendations will minimize the risk of a serious complication.) . Surgeries: Avoid having procedures within 2 weeks of any surgery. (Avoid for 2 weeks before or after any surgery). . Flu Shots: Avoid having procedures within 2 weeks of a flu shots or . (Avoid for 2 weeks before or after immunizations). . Barium: Avoid having a procedure within 7-10 days after having had a radiological study involving the use of radiological contrast. (Myelograms, Barium swallow or enema study). . Heart attacks: Avoid any elective procedures or surgeries for the initial 6 months after a "Myocardial Infarction" (Heart Attack). . Blood thinners: It is imperative that you stop these medications before procedures. Let us know if you if you take any blood thinner.  . Infection: Avoid procedures during or within two weeks of an infection (including chest colds or gastrointestinal problems). Symptoms associated with infections include: Localized redness, fever, chills, night sweats or profuse sweating, burning sensation when voiding, cough, congestion, stuffiness, runny nose, sore throat, diarrhea, nausea, vomiting, cold or Flu symptoms, recent or current infections. It is specially important if the infection is over the area that we intend to treat. . Heart and lung problems: Symptoms that may suggest an active cardiopulmonary problem include: cough, chest pain, breathing difficulties or shortness of breath, dizziness, ankle swelling, uncontrolled high or unusually low blood pressure, and/or palpitations. If you are experiencing any of these symptoms, cancel your procedure and contact your primary care physician for an evaluation.  Remember:  Regular Business hours are:    Monday to Thursday 8:00 AM to 4:00 PM  Provider's  Schedule: Lon Klippel, MD:  Procedure days: Tuesday and Thursday 7:30 AM to 4:00 PM  Bilal Lateef, MD:  Procedure days: Monday and Wednesday 7:30 AM to 4:00 PM ____________________________________________________________________________________________   ____________________________________________________________________________________________  General Risks and Possible Complications  Patient Responsibilities: It is important that you read this as it is part of your informed consent. It is our duty to inform you of the risks and possible complications associated with treatments offered to you. It is your responsibility as a patient to read this and to ask questions about anything that is not clear or that you believe was not covered in this document.  Patient's Rights: You have the right to refuse treatment. You also have the right to change your mind, even after initially having agreed to have the treatment done. However, under this last option, if you wait until the last second to change your mind, you may be charged for the materials used up to that point.  Introduction: Medicine is not an exact science. Everything in Medicine, including the lack of treatment(s), carries the potential for danger, harm, or loss (which is by definition: Risk). In Medicine, a complication is a secondary problem, condition, or disease that can aggravate an already existing one. All treatments carry the risk of possible complications. The fact that a side effects or complications occurs, does not imply that the treatment was conducted incorrectly. It must be clearly understood that these can happen even when everything is done following the highest safety standards.  No treatment: You can choose not to proceed with the proposed treatment alternative. The "PRO(s)" would include: avoiding the risk of complications associated with the therapy. The "CON(s)" would include: not getting any of the treatment  benefits. These benefits fall under one of three categories: diagnostic; therapeutic; and/or palliative. Diagnostic benefits include: getting information which can ultimately lead to improvement of the disease or symptom(s). Therapeutic benefits are those associated with the successful treatment of the disease. Finally, palliative benefits are those related to the decrease of the primary symptoms, without necessarily curing the condition (example: decreasing the pain from a flare-up of a chronic condition, such as incurable terminal cancer).  General Risks and Complications: These are associated to most interventional treatments. They can occur alone, or in combination. They fall under one of the following six (6) categories: no benefit or worsening of symptoms; bleeding; infection; nerve damage; allergic reactions; and/or death. 1. No benefits or worsening of symptoms: In Medicine there are no guarantees, only probabilities. No healthcare provider can ever guarantee that a medical treatment will work, they can only state the probability that it may. Furthermore, there is always the possibility that the condition may worsen, either directly, or indirectly, as a consequence of the treatment. 2. Bleeding: This is more common if the patient is taking a blood thinner, either prescription or over the counter (example: Goody Powders, Fish oil, Aspirin, Garlic, etc.), or if suffering a condition associated with impaired coagulation (example: Hemophilia, cirrhosis of the liver, low platelet counts, etc.). However, even if you do not have one on these, it can still happen. If you have any of these conditions, or take one of these drugs, make sure to notify your treating physician. 3. Infection: This is more common in patients with a compromised immune system, either due to disease (example: diabetes, cancer, human immunodeficiency virus [HIV], etc.), or due to medications or treatments (example: therapies used to treat  cancer and   rheumatological diseases). However, even if you do not have one on these, it can still happen. If you have any of these conditions, or take one of these drugs, make sure to notify your treating physician. 4. Nerve Damage: This is more common when the treatment is an invasive one, but it can also happen with the use of medications, such as those used in the treatment of cancer. The damage can occur to small secondary nerves, or to large primary ones, such as those in the spinal cord and brain. This damage may be temporary or permanent and it may lead to impairments that can range from temporary numbness to permanent paralysis and/or brain death. 5. Allergic Reactions: Any time a substance or material comes in contact with our body, there is the possibility of an allergic reaction. These can range from a mild skin rash (contact dermatitis) to a severe systemic reaction (anaphylactic reaction), which can result in death. 6. Death: In general, any medical intervention can result in death, most of the time due to an unforeseen complication. ____________________________________________________________________________________________   

## 2020-10-22 NOTE — Progress Notes (Signed)
Nursing Pain Medication Assessment:  Safety precautions to be maintained throughout the outpatient stay will include: orient to surroundings, keep bed in low position, maintain call bell within reach at all times, provide assistance with transfer out of bed and ambulation.  Medication Inspection Compliance: Cynthia Bennett did not comply with our request to bring her pills to be counted. She was reminded that bringing the medication bottles, even when empty, is a requirement.  Medication: None brought in. Pill/Patch Count: None available to be counted. Bottle Appearance: No container available. Did not bring bottle(s) to appointment. Filled Date: N/A Last Medication intake:  Today

## 2020-10-22 NOTE — Telephone Encounter (Signed)
callled to [facility] pharmacy to cancel Rx's for oxycodone 5 mg that were inadvertantly sent.  Patient wanted to switch pharmacy to [facility].  Oxycodone 5 mg x 2 months  Sent into [facility].

## 2020-10-23 ENCOUNTER — Ambulatory Visit
Admission: RE | Admit: 2020-10-23 | Discharge: 2020-10-23 | Disposition: A | Discharge status: Home / Self Care | Payer: PPO | Source: Ambulatory Visit | Attending: Pain Medicine | Admitting: Pain Medicine

## 2020-10-23 ENCOUNTER — Encounter: Payer: Self-pay | Admitting: Pain Medicine

## 2020-10-23 ENCOUNTER — Ambulatory Visit (HOSPITAL_BASED_OUTPATIENT_CLINIC_OR_DEPARTMENT_OTHER): Payer: PPO | Admitting: Pain Medicine

## 2020-10-23 VITALS — BP 117/65 | HR 91 | Temp 97.3°F | Resp 14 | Ht 62.0 in | Wt 160.0 lb

## 2020-10-23 DIAGNOSIS — M47816 Spondylosis without myelopathy or radiculopathy, lumbar region: Secondary | ICD-10-CM | POA: Insufficient documentation

## 2020-10-23 DIAGNOSIS — G8929 Other chronic pain: Secondary | ICD-10-CM

## 2020-10-23 DIAGNOSIS — M47817 Spondylosis without myelopathy or radiculopathy, lumbosacral region: Secondary | ICD-10-CM | POA: Insufficient documentation

## 2020-10-23 DIAGNOSIS — M545 Low back pain, unspecified: Secondary | ICD-10-CM | POA: Diagnosis not present

## 2020-10-23 DIAGNOSIS — M5136 Other intervertebral disc degeneration, lumbar region: Secondary | ICD-10-CM | POA: Insufficient documentation

## 2020-10-23 DIAGNOSIS — M4316 Spondylolisthesis, lumbar region: Secondary | ICD-10-CM | POA: Insufficient documentation

## 2020-10-23 MED ORDER — LIDOCAINE HCL 2 % IJ SOLN
20.0000 mL | Freq: Once | INTRAMUSCULAR | Status: AC
  Administered 2020-10-23: 09:00:00 400 mg
  Filled 2020-10-23: qty 40

## 2020-10-23 MED ORDER — ROPIVACAINE HCL 2 MG/ML IJ SOLN
18.0000 mL | Freq: Once | INTRAMUSCULAR | Status: AC
  Administered 2020-10-23: 09:00:00 18 mL via PERINEURAL
  Filled 2020-10-23: qty 20

## 2020-10-23 MED ORDER — TRIAMCINOLONE ACETONIDE 40 MG/ML IJ SUSP
80.0000 mg | Freq: Once | INTRAMUSCULAR | Status: AC
  Administered 2020-10-23: 09:00:00 80 mg
  Filled 2020-10-23: qty 2

## 2020-10-23 MED ORDER — LACTATED RINGERS IV SOLN
1000.0000 mL | Freq: Once | INTRAVENOUS | Status: AC
  Administered 2020-10-23: 09:00:00 1000 mL via INTRAVENOUS

## 2020-10-23 MED ORDER — MIDAZOLAM HCL 5 MG/5ML IJ SOLN
1.0000 mg | INTRAMUSCULAR | Status: DC | PRN
  Administered 2020-10-23: 09:00:00 1 mg via INTRAVENOUS
  Filled 2020-10-23: qty 5

## 2020-10-23 MED ORDER — FENTANYL CITRATE (PF) 100 MCG/2ML IJ SOLN
25.0000 ug | INTRAMUSCULAR | Status: DC | PRN
  Administered 2020-10-23: 50 ug via INTRAVENOUS
  Filled 2020-10-23: qty 2

## 2020-10-23 NOTE — Patient Instructions (Signed)
____________________________________________________________________________________________  Post-Procedure Discharge Instructions  Instructions:  Apply ice:   Purpose: This will minimize any swelling and discomfort after procedure.   When: Day of procedure, as soon as you get home.  How: Fill a plastic sandwich bag with crushed ice. Cover it with a small towel and apply to injection site.  How long: (15 min on, 15 min off) Apply for 15 minutes then remove x 15 minutes.  Repeat sequence on day of procedure, until you go to bed.  Apply heat:   Purpose: To treat any soreness and discomfort from the procedure.  When: Starting the next day after the procedure.  How: Apply heat to procedure site starting the day following the procedure.  How long: May continue to repeat daily, until discomfort goes away.  Food intake: Start with clear liquids (like water) and advance to regular food, as tolerated.   Physical activities: Keep activities to a minimum for the first 8 hours after the procedure. After that, then as tolerated.  Driving: If you have received any sedation, be responsible and do not drive. You are not allowed to drive for 24 hours after having sedation.  Blood thinner: (Applies only to those taking blood thinners) You may restart your blood thinner 6 hours after your procedure.  Insulin: (Applies only to Diabetic patients taking insulin) As soon as you can eat, you may resume your normal dosing schedule.  Infection prevention: Keep procedure site clean and dry. Shower daily and clean area with soap and water.  Post-procedure Pain Diary: Extremely important that this be done correctly and accurately. Recorded information will be used to determine the next step in treatment. For the purpose of accuracy, follow these rules:  Evaluate only the area treated. Do not report or include pain from an untreated area. For the purpose of this evaluation, ignore all other areas of pain,  except for the treated area.  After your procedure, avoid taking a long nap and attempting to complete the pain diary after you wake up. Instead, set your alarm clock to go off every hour, on the hour, for the initial 8 hours after the procedure. Document the duration of the numbing medicine, and the relief you are getting from it.  Do not go to sleep and attempt to complete it later. It will not be accurate. If you received sedation, it is likely that you were given a medication that may cause amnesia. Because of this, completing the diary at a later time may cause the information to be inaccurate. This information is needed to plan your care.  Follow-up appointment: Keep your post-procedure follow-up evaluation appointment after the procedure (usually 2 weeks for most procedures, 6 weeks for radiofrequencies). DO NOT FORGET to bring you pain diary with you.   Expect: (What should I expect to see with my procedure?)  From numbing medicine (AKA: Local Anesthetics): Numbness or decrease in pain. You may also experience some weakness, which if present, could last for the duration of the local anesthetic.  Onset: Full effect within 15 minutes of injected.  Duration: It will depend on the type of local anesthetic used. On the average, 1 to 8 hours.   From steroids (Applies only if steroids were used): Decrease in swelling or inflammation. Once inflammation is improved, relief of the pain will follow.  Onset of benefits: Depends on the amount of swelling present. The more swelling, the longer it will take for the benefits to be seen. In some cases, up to 10 days.    Duration: Steroids will stay in the system x 2 weeks. Duration of benefits will depend on multiple posibilities including persistent irritating factors.  Side-effects: If present, they may typically last 2 weeks (the duration of the steroids).  Frequent: Cramps (if they occur, drink Gatorade and take over-the-counter Magnesium 450-500 mg  once to twice a day); water retention with temporary weight gain; increases in blood sugar; decreased immune system response; increased appetite.  Occasional: Facial flushing (red, warm cheeks); mood swings; menstrual changes.  Uncommon: Long-term decrease or suppression of natural hormones; bone thinning. (These are more common with higher doses or more frequent use. This is why we prefer that our patients avoid having any injection therapies in other practices.)   Very Rare: Severe mood changes; psychosis; aseptic necrosis.  From procedure: Some discomfort is to be expected once the numbing medicine wears off. This should be minimal if ice and heat are applied as instructed.  Call if: (When should I call?)  You experience numbness and weakness that gets worse with time, as opposed to wearing off.  New onset bowel or bladder incontinence. (Applies only to procedures done in the spine)  Emergency Numbers:  Durning business hours (Monday - Thursday, 8:00 AM - 4:00 PM) (Friday, 9:00 AM - 12:00 Noon): [phone]  After hours: [phone]  NOTE: If you are having a problem and are unable connect with, or to talk to a provider, then go to your nearest urgent care or emergency department. If the problem is serious and urgent, please call 911. ____________________________________________________________________________________________   ____________________________________________________________________________________________  Virtual Visits   Eligibility In order to be eligible for "Virtual Visit Encounters", you must be available over the phone. It is the patient's responsibility to make sure we have a way to contact you.  We understand how people are reluctant to pickup on "unknown" calls, therefore, we suggest adding our telephone numbers to your list of "CONTACT(s)". This way, you should be able to readily identify our calls when you receive them.  When will this type of  visits be used? The decision will be made on a case by case basis.  At what time will I be called? This is an excellent question. The providers will try to call you during the day, whenever they have spare time. For the purpose of the day's schedule, you are assigned a time for your appointment, however, the call may come anytime during the day. We need you to be available on a moment's notice. If you are unable to do this, then request an "in-person" appointment rather than a "virtual visit".  Can I request my medication visits to be "Virtual"? Yes you may request it, but the decision is entirely up to the healthcare provider. Control substances require specific monitoring that requires Face-to-Face encounters. The number of encounters  and the extent of the monitoring is determined on a case by case basis.  Add a new contact to your smart phone and label it "PAIN CLINIC" Under this contact add the following numbers: Main: [phone] (Official Contact Number) Nurses: [phone] (These are outgoing only calling systems. Do not call this number.) Dr. Kharon Hixon: [phone] or [phone] (Outgoing calls only. Do not call this number.)  ____________________________________________________________________________________________    

## 2020-10-23 NOTE — Progress Notes (Signed)
Safety precautions to be maintained throughout the outpatient stay will include: orient to surroundings, keep bed in low position, maintain call bell within reach at all times, provide assistance with transfer out of bed and ambulation.  

## 2020-10-23 NOTE — Progress Notes (Addendum)
PROVIDER NOTE: Information contained herein reflects review and annotations entered in association with encounter. Interpretation of such information and data should be left to medically-trained personnel. Information provided to patient can be located elsewhere in the medical record under "Patient Instructions". Document created using STT-dictation technology, any transcriptional errors that may result from process are unintentional.    Patient: Cynthia Bennett  Service Category: Procedure  Provider: Oswaldo Done, MD  DOB: 02-Nov-1933  DOS: 10/23/2020  Location: [facility]  MRN: [id number]  Setting: Ambulatory - outpatient  Referring Provider: Jerl Mina, MD  Type: Established Patient  Specialty: Interventional Pain Management  PCP: Jerl Mina, MD   Primary Reason for Visit: Interventional Pain Management Treatment. CC: Back Pain (Bilateral low)  Procedure:          Anesthesia, Analgesia, Anxiolysis:  Type: Lumbar Facet, Medial Branch Block(s) #R1/L2  + Left L5-sacral ala pseudoarthrosis inj. Primary Purpose: Diagnostic Region: Posterolateral Lumbosacral Spine Level: L2, L3, L4, L5, & S1 Medial Branch Level(s). Injecting these levels blocks the L3-4, L4-5, and L5-S1 lumbar facet joints. Laterality: Bilateral  Type: Moderate (Conscious) Sedation combined with Local Anesthesia Indication(s): Analgesia and Anxiety Route: Intravenous (IV) IV Access: Secured Sedation: Meaningful verbal contact was maintained at all times during the procedure  Local Anesthetic: Lidocaine 1-2%  Position: Prone   Indications: 1. Lumbar facet syndrome (Bilateral) (L>R)   2. Grade 1 spondylolisthesis of L4 over L5   3. Spondylosis without myelopathy or radiculopathy, lumbosacral region   4. Lumbar facet hypertrophy (Bilateral)   5. DDD (degenerative disc disease), lumbar   6. Chronic low back pain (1ry area of Pain) (Bilateral) (L>R) w/o sciatica    Pain Score: Pre-procedure: 6  /10 Post-procedure: 0-No pain/10   Pre-op H&P Assessment:  Cynthia Bennett is a 86 y.o. (year old), female patient, seen today for interventional treatment. She  has a past surgical history that includes Esophagogastroduodenoscopy (N/A, 04/16/2015); Appendectomy; and Abdominal hysterectomy. Ms. Stanbrough has a current medication list which includes the following prescription(s): acetaminophen, bupropion, vitamin d3, cyanocobalamin, doxepin, esomeprazole, [facility] slow release iron, multivitamin, ondansetron, [START ON 12/10/2020] oxycodone, [START ON 11/10/2020] oxycodone, pantoprazole, and zolpidem, and the following Facility-Administered Medications: fentanyl and midazolam. Her primarily concern today is the Back Pain (Bilateral low)  Initial Vital Signs:  Pulse/HCG Rate: 91ECG Heart Rate: 89 Temp: (!) 97.3 F (36.3 C) Resp: 18 BP: (!) 81/61 SpO2: 100 %  BMI: Estimated body mass index is 29.26 kg/m as calculated from the following:   Height as of this encounter: 5\' 2"  (1.575 m).   Weight as of this encounter: 160 lb (72.6 kg).  Risk Assessment: Allergies: Reviewed. She is allergic to ibuprofen.  Allergy Precautions: None required Coagulopathies: Reviewed. None identified.  Blood-thinner therapy: None at this time Active Infection(s): Reviewed. None identified. Cynthia Bennett is afebrile  Site Confirmation: Ms. Feltman was asked to confirm the procedure and laterality before marking the site Procedure checklist: Completed Consent: Before the procedure and under the influence of no sedative(s), amnesic(s), or anxiolytics, the patient was informed of the treatment options, risks and possible complications. To fulfill our ethical and legal obligations, as recommended by the American Medical Association's Code of Ethics, I have informed the patient of my clinical impression; the nature and purpose of the treatment or procedure; the risks, benefits, and possible complications of the intervention; the alternatives,  including doing nothing; the risk(s) and benefit(s) of the alternative treatment(s) or procedure(s); and the risk(s) and benefit(s) of doing nothing. The  patient was provided information about the general risks and possible complications associated with the procedure. These may include, but are not limited to: failure to achieve desired goals, infection, bleeding, organ or nerve damage, allergic reactions, paralysis, and death. In addition, the patient was informed of those risks and complications associated to Spine-related procedures, such as failure to decrease pain; infection (i.e.: Meningitis, epidural or intraspinal abscess); bleeding (i.e.: epidural hematoma, subarachnoid hemorrhage, or any other type of intraspinal or peri-dural bleeding); organ or nerve damage (i.e.: Any type of peripheral nerve, nerve root, or spinal cord injury) with subsequent damage to sensory, motor, and/or autonomic systems, resulting in permanent pain, numbness, and/or weakness of one or several areas of the body; allergic reactions; (i.e.: anaphylactic reaction); and/or death. Furthermore, the patient was informed of those risks and complications associated with the medications. These include, but are not limited to: allergic reactions (i.e.: anaphylactic or anaphylactoid reaction(s)); adrenal axis suppression; blood sugar elevation that in diabetics may result in ketoacidosis or comma; water retention that in patients with history of congestive heart failure may result in shortness of breath, pulmonary edema, and decompensation with resultant heart failure; weight gain; swelling or edema; medication-induced neural toxicity; particulate matter embolism and blood vessel occlusion with resultant organ, and/or nervous system infarction; and/or aseptic necrosis of one or more joints. Finally, the patient was informed that Medicine is not an exact science; therefore, there is also the possibility of unforeseen or unpredictable risks  and/or possible complications that may result in a catastrophic outcome. The patient indicated having understood very clearly. We have given the patient no guarantees and we have made no promises. Enough time was given to the patient to ask questions, all of which were answered to the patient's satisfaction. Ms. Renier has indicated that she wanted to continue with the procedure. Attestation: I, the ordering provider, attest that I have discussed with the patient the benefits, risks, side-effects, alternatives, likelihood of achieving goals, and potential problems during recovery for the procedure that I have provided informed consent. Date  Time: 10/23/2020  8:22 AM  Pre-Procedure Preparation:  Monitoring: As per clinic protocol. Respiration, ETCO2, SpO2, BP, heart rate and rhythm monitor placed and checked for adequate function Safety Precautions: Patient was assessed for positional comfort and pressure points before starting the procedure. Time-out: I initiated and conducted the "Time-out" before starting the procedure, as per protocol. The patient was asked to participate by confirming the accuracy of the "Time Out" information. Verification of the correct person, site, and procedure were performed and confirmed by me, the nursing staff, and the patient. "Time-out" conducted as per Joint Commission's Universal Protocol (UP.01.01.01). Time: 7829  Description of Procedure:          Laterality: Bilateral. The procedure was performed in identical fashion on both sides. Levels:  L2, L3, L4, L5, & S1 Medial Branch Level(s) Area Prepped: Posterior Lumbosacral Region DuraPrep (Iodine Povacrylex [0.7% available iodine] and Isopropyl Alcohol, 74% w/w) Safety Precautions: Aspiration looking for blood return was conducted prior to all injections. At no point did we inject any substances, as a needle was being advanced. Before injecting, the patient was told to immediately notify me if she was experiencing any  new onset of "ringing in the ears, or metallic taste in the mouth". No attempts were made at seeking any paresthesias. Safe injection practices and needle disposal techniques used. Medications properly checked for expiration dates. SDV (single dose vial) medications used. After the completion of the procedure, all disposable equipment used  was discarded in the proper designated medical waste containers. Local Anesthesia: Protocol guidelines were followed. The patient was positioned over the fluoroscopy table. The area was prepped in the usual manner. The time-out was completed. The target area was identified using fluoroscopy. A 12-in long, straight, sterile hemostat was used with fluoroscopic guidance to locate the targets for each level blocked. Once located, the skin was marked with an approved surgical skin marker. Once all sites were marked, the skin (epidermis, dermis, and hypodermis), as well as deeper tissues (fat, connective tissue and muscle) were infiltrated with a small amount of a short-acting local anesthetic, loaded on a 10cc syringe with a 25G, 1.5-in  Needle. An appropriate amount of time was allowed for local anesthetics to take effect before proceeding to the next step. Local Anesthetic: Lidocaine 2.0% The unused portion of the local anesthetic was discarded in the proper designated containers. Technical explanation of process:  L2 Medial Branch Nerve Block (MBB): The target area for the L2 medial branch is at the junction of the postero-lateral aspect of the superior articular process and the superior, posterior, and medial edge of the transverse process of L3. Under fluoroscopic guidance, a Quincke needle was inserted until contact was made with os over the superior postero-lateral aspect of the pedicular shadow (target area). After negative aspiration for blood, 0.5 mL of the nerve block solution was injected without difficulty or complication. The needle was removed intact. L3 Medial  Branch Nerve Block (MBB): The target area for the L3 medial branch is at the junction of the postero-lateral aspect of the superior articular process and the superior, posterior, and medial edge of the transverse process of L4. Under fluoroscopic guidance, a Quincke needle was inserted until contact was made with os over the superior postero-lateral aspect of the pedicular shadow (target area). After negative aspiration for blood, 0.5 mL of the nerve block solution was injected without difficulty or complication. The needle was removed intact. L4 Medial Branch Nerve Block (MBB): The target area for the L4 medial branch is at the junction of the postero-lateral aspect of the superior articular process and the superior, posterior, and medial edge of the transverse process of L5. Under fluoroscopic guidance, a Quincke needle was inserted until contact was made with os over the superior postero-lateral aspect of the pedicular shadow (target area). After negative aspiration for blood, 0.5 mL of the nerve block solution was injected without difficulty or complication. The needle was removed intact. L5 Medial Branch Nerve Block (MBB): The target area for the L5 medial branch is at the junction of the postero-lateral aspect of the superior articular process and the superior, posterior, and medial edge of the sacral ala. Under fluoroscopic guidance, a Quincke needle was inserted until contact was made with os over the superior postero-lateral aspect of the pedicular shadow (target area). After negative aspiration for blood, 0.5 mL of the nerve block solution was injected without difficulty or complication. The needle was removed intact. S1 Medial Branch Nerve Block (MBB): The target area for the S1 medial branch is at the posterior and inferior 6 o'clock position of the L5-S1 facet joint. Under fluoroscopic guidance, the Quincke needle inserted for the L5 MBB was redirected until contact was made with os over the inferior  and postero aspect of the sacrum, at the 6 o' clock position under the L5-S1 facet joint (Target area). After negative aspiration for blood, 0.5 mL of the nerve block solution was injected without difficulty or complication. The needle was  removed intact.  Nerve block solution: 0.2% PF-Ropivacaine + Triamcinolone (40 mg/mL) diluted to a final concentration of 4 mg of Triamcinolone/mL of Ropivacaine The unused portion of the solution was discarded in the proper designated containers. Procedural Needles: 22-gauge, 5-inch, Quincke needles used for all levels.  Once the entire procedure was completed, the treated area was cleaned, making sure to leave some of the prepping solution back to take advantage of its long term bactericidal properties.   Illustration of the posterior view of the lumbar spine and the posterior neural structures. Laminae of L2 through S1 are labeled. DPRL5, dorsal primary ramus of L5; DPRS1, dorsal primary ramus of S1; DPR3, dorsal primary ramus of L3; FJ, facet (zygapophyseal) joint L3-L4; I, inferior articular process of L4; LB1, lateral branch of dorsal primary ramus of L1; IAB, inferior articular branches from L3 medial branch (supplies L4-L5 facet joint); IBP, intermediate branch plexus; MB3, medial branch of dorsal primary ramus of L3; NR3, third lumbar nerve root; S, superior articular process of L5; SAB, superior articular branches from L4 (supplies L4-5 facet joint also); TP3, transverse process of L3.  Vitals:   10/23/20 0905 10/23/20 0910 10/23/20 0920 10/23/20 0932  BP: 111/70 111/63 122/62 117/65  Pulse:      Resp: Temp:    (!) 97.3 F (36.3 C)  SpO2: 96% 95% 96% 100%  Weight:      Height:         Start Time: 0852 hrs. End Time: 0903 hrs.  Imaging Guidance (Spinal):          Type of Imaging Technique: Fluoroscopy Guidance (Spinal) Indication(s): Assistance in needle guidance and placement for procedures requiring needle placement in or near  specific anatomical locations not easily accessible without such assistance. Exposure Time: Please see nurses notes. Contrast: None used. Fluoroscopic Guidance: I was personally present during the use of fluoroscopy. "Tunnel Vision Technique" used to obtain the best possible view of the target area. Parallax error corrected before commencing the procedure. "Direction-depth-direction" technique used to introduce the needle under continuous pulsed fluoroscopy. Once target was reached, antero-posterior, oblique, and lateral fluoroscopic projection used confirm needle placement in all planes. Images permanently stored in EMR.              Interpretation: No contrast injected. I personally interpreted the imaging intraoperatively. Adequate needle placement confirmed in multiple planes. Permanent images saved into the patient's record.  Antibiotic Prophylaxis:   Anti-infectives (From admission, onward)   None     Indication(s): None identified  Post-operative Assessment:  Post-procedure Vital Signs:  Pulse/HCG Rate: 9180 Temp: (!) 97.3 F (36.3 C) Resp: 14 BP: 117/65 SpO2: 100 %  EBL: None  Complications: No immediate post-treatment complications observed by team, or reported by patient.  Note: The patient tolerated the entire procedure well. A repeat set of vitals were taken after the procedure and the patient was kept under observation following institutional policy, for this type of procedure. Post-procedural neurological assessment was performed, showing return to baseline, prior to discharge. The patient was provided with post-procedure discharge instructions, including a section on how to identify potential problems. Should any problems arise concerning this procedure, the patient was given instructions to immediately contact us, at any time, without hesitation. In any case, we plan to contact the patient by telephone for a follow-up status report regarding this interventional  procedure.  Comments:  No additional relevant information.  Plan of Care  Orders:  Orders Placed This Encounter  Procedures  . LUMBAR FACET(MEDIAL BRANCH NERVE BLOCK) MBNB    Scheduling Instructions:     Procedure: Lumbar facet block (AKA.: Lumbosacral medial branch nerve block)     Side: Bilateral     Level: L3-4, L4-5, & L5-S1 Facets (L2, L3, L4, L5, & S1 Medial Branch Nerves)     Sedation: Patient's choice.     Timeframe: Today    Order Specific Question:   Where will this procedure be performed?    Answer:   [facility]  . DG PA[facility] C-ARM 1-60 MIN NO REPORT    Intraoperative interpretation by procedural physician at [facility].    Standing Status:   Standing    Number of Occurrences:   1    Order Specific Question:   Reason for exam:    Answer:   Assistance in needle guidance and placement for procedures requiring needle placement in or near specific anatomical locations not easily accessible without such assistance.  . Informed Consent Details: Physician/Practitioner Attestation; Transcribe to consent form and obtain patient signature    Nursing Order: Transcribe to consent form and obtain patient signature. Note: Always confirm laterality of pain with Ms. Winecoff, before procedure.    Order Specific Question:   Physician/Practitioner attestation of informed consent for procedure/surgical case    Answer:   I, the physician/practitioner, attest that I have discussed with the patient the benefits, risks, side effects, alternatives, likelihood of achieving goals and potential problems during recovery for the procedure that I have provided informed consent.    Order Specific Question:   Procedure    Answer:   Lumbar Facet Block  under fluoroscopic guidance    Order Specific Question:   Physician/Practitioner performing the procedure    Answer:   Aquilla Voiles A. Laban EmperorNaveira MD    Order Specific Question:   Indication/Reason    Answer:   Low Back Pain, with our without  leg pain, due to Facet Joint Arthralgia (Joint Pain) Spondylosis (Arthritis of the Spine), without myelopathy or radiculopathy (Nerve Damage).  . Provide equipment / supplies at bedside    "Block Tray" (Disposable  single use) Needle type: SpinalSpinal Amount/quantity: 4 Size: Medium (5-inch) Gauge: 22G    Standing Status:   Standing    Number of Occurrences:   1    Order Specific Question:   Specify    Answer:   Block Tray   Chronic Opioid Analgesic:  Oxycodone IR 5 mg, 1 tab PO 3 times daily (maximum of 15 mg/day of oxycodone.) MME/day:  22.5 mg/day.   Medications ordered for procedure: Meds ordered this encounter  Medications  . lidocaine (XYLOCAINE) 2 % (with pres) injection 400 mg  . lactated ringers infusion 1,000 mL  . midazolam (VERSED) 5 MG/5ML injection 1-2 mg    Make sure Flumazenil is available in the pyxis when using this medication. If oversedation occurs, administer 0.2 mg IV over 15 sec. If after 45 sec no response, administer 0.2 mg again over 1 min; may repeat at 1 min intervals; not to exceed 4 doses (1 mg)  . fentaNYL (SUBLIMAZE) injection 25-50 mcg    Make sure Narcan is available in the pyxis when using this medication. In the event of respiratory depression (RR< 8/min): Titrate NARCAN (naloxone) in increments of 0.1 to 0.2 mg IV at 2-3 minute intervals, until desired degree of reversal.  . ropivacaine (PF) 2 mg/mL (0.2%) (NAROPIN) injection 18 mL  . triamcinolone acetonide (KENALOG-40) injection 80 mg   Medications administered:  We administered lidocaine, lactated ringers, midazolam, fentaNYL, ropivacaine (PF) 2 mg/mL (0.2%), and triamcinolone acetonide.  See the medical record for exact dosing, route, and time of administration.  Follow-up plan:   Return in about 2 weeks (around 11/06/2020) for (VIrtual), (PP) Follow-up.       Interventional Therapies  Risk  Complexity Considerations:   Advanced age   Planned  Pending:   Diagnostic bilateral lumbar  facet block R1L2    Under consideration:   Diagnostic right lumbar facet block #1  Diagnostic right ischial bursa injection #1  Possible bilateral lumbar facet RFA Possible bilateral sacroiliac joint RFA Diagnostic left L4 TFESI  Diagnostic left IA hip joint injection  Diagnostic bilateral femoral nerve + obturator NB Possible bilateral femoral nerve + obturator nerve RFA   Completed:   Therapeutic left L4-5 LESI x6 (04/10/2020)  Therapeutic right L4-5 LESI x1 (07/21/2017)  Palliative right L5-S1 LESI x1 (09/27/2015)  Therapeutic left L5-S1 LESI x1 (02/05/2016)  Diagnostic right SI joint injection x2 (08/25/2019)  Diagnostic left SI joint injection x2 (01/27/2019)  Diagnostic left glenohumeral joint injection x1 (04/29/2017)  Left biceps muscle trigger point injection x1 (07/21/2017)  Right PSIS trigger point injection x1 (07/27/2018)  Diagnostic left lumbar facet block x1 (05/25/2018)  Diagnostic left IA hip injection x1 (01/27/2019)    Palliative options:   None at this time    Recent Visits Date Type Provider Dept  10/22/20 Office Visit Delano Metz, MD [facility]  10/11/20 Telemedicine Delano Metz, MD [facility]  Showing recent visits within past 90 days and meeting all other requirements Today's Visits Date Type Provider Dept  10/23/20 Procedure visit Delano Metz, MD [facility]  Showing today's visits and meeting all other requirements Future Appointments Date Type Provider Dept  11/07/20 Appointment Delano Metz, MD [facility]  Showing future appointments within next 90 days and meeting all other requirements  Disposition: Discharge home  Discharge (Date  Time): 10/23/2020; 0935 hrs.   Primary Care Physician: Jerl Mina, MD Location: [facility] Note by: Oswaldo Done, MD Date: 10/23/2020; Time: 10:41 AM  Disclaimer:  Medicine is not an exact science. The  only guarantee in medicine is that nothing is guaranteed. It is important to note that the decision to proceed with this intervention was based on the information collected from the patient. The Data and conclusions were drawn from the patient's questionnaire, the interview, and the physical examination. Because the information was provided in large part by the patient, it cannot be guaranteed that it has not been purposely or unconsciously manipulated. Every effort has been made to obtain as much relevant data as possible for this evaluation. It is important to note that the conclusions that lead to this procedure are derived in large part from the available data. Always take into account that the treatment will also be dependent on availability of resources and existing treatment guidelines, considered by other Pain Management Practitioners as being common knowledge and practice, at the time of the intervention. For Medico-Legal purposes, it is also important to point out that variation in procedural techniques and pharmacological choices are the acceptable norm. The indications, contraindications, technique, and results of the above procedure should only be interpreted and judged by a Board-Certified Interventional Pain Specialist with extensive familiarity and expertise in the same exact procedure and technique.

## 2020-10-24 ENCOUNTER — Telehealth: Payer: Self-pay

## 2020-10-24 NOTE — Telephone Encounter (Signed)
Post procedure phone call. Patient states she is doing good.  

## 2020-11-06 ENCOUNTER — Encounter: Payer: Self-pay | Admitting: Pain Medicine

## 2020-11-06 NOTE — Progress Notes (Signed)
Patient: Cynthia Bennett  Service Category: E/M  Provider: Gaspar Cola, MD  DOB: 1933-06-28  DOS: 11/07/2020  Location: Office  MRN: [id number]  Setting: Ambulatory outpatient  Referring Provider: [name], MD  Type: Established Patient  Specialty: Interventional Pain Management  PCP: [name], MD  Location: Remote location  Delivery: TeleHealth     Virtual Encounter - Pain Management PROVIDER NOTE: Information contained herein reflects review and annotations entered in association with encounter. Interpretation of such information and data should be left to medically-trained personnel. Information provided to patient can be located elsewhere in the medical record under "Patient Instructions". Document created using STT-dictation technology, any transcriptional errors that may result from process are unintentional.    Contact & Pharmacy Preferred: [place]: [phone] (home) Mobile: There is no such number on file (mobile). E-mail: [email or web address]  [id number] - [place], [address] [address] Phone: [phone] Fax: [place], [address] [place] [address] Phone: [phone] Fax: [phone]   Pre-screening  [name] offered "in-person" vs "virtual" encounter. She indicated preferring virtual for this encounter.   Reason COVID-19*  Social distancing based on CDC and AMA recommendations.   I contacted [name] on 11/07/2020 via telephone.      I clearly identified myself as Gaspar Cola, MD. I verified that I was speaking with the correct person using two identifiers (Name: Cynthia Bennett, and date of birth: December 18, 1932).  Consent I sought verbal advanced consent from [name] for virtual visit interactions. I informed Ms. Risk of possible security and privacy concerns, risks, and limitations associated with providing "not-in-person" medical  evaluation and management services. I also informed [name] of the availability of "in-person" appointments. Finally, I informed her that there would be a charge for the virtual visit and that she could be  personally, fully or partially, financially responsible for it. [name] expressed understanding and agreed to proceed.   Historic Elements   [name] is a 86 y.o. year old, female patient evaluated today after our last contact on 10/23/2020. [name]  has a past medical history of Dizzy spells, Fibromyalgia, Glaucoma, Hiatal hernia, Osteoarthritis, Osteoarthritis of spine with radiculopathy, lumbar region (08/14/2015), Peptic ulcer disease, Reflux, Spinal stenosis, Stroke ([place]), and TIA (transient ischemic attack). She also  has a past surgical history that includes Esophagogastroduodenoscopy (N/A, 04/16/2015); Appendectomy; and Abdominal hysterectomy. [name] has a current medication list which includes the following prescription(s): acetaminophen, bupropion, vitamin d3, cyanocobalamin, doxepin, esomeprazole, cvs slow release iron, multivitamin, ondansetron, [START ON 12/10/2020] oxycodone, [START ON 11/10/2020] oxycodone, pantoprazole, and zolpidem. She  reports that she has quit smoking. She has never used smokeless tobacco. She reports that she does not drink alcohol and does not use drugs. [name] is allergic to ibuprofen.   HPI  Today, she is being contacted for a post-procedure assessment.  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  I have explained to her what this means and I will be including in this note some images from her last procedure where I will highlight the area that I believe is contributing to her Bennett back pain.  I have informed the patient that should the pain again gets to the point where she feels that something needs to be done, I have proposed  to go in and inject a small amount of local anesthetic  and steroid in the area of the L5-sacral ala pseudoarthrosis so as to figure out if most of the pain is coming from that area.  If it is, then we could refer the patient's to the neurosurgeons to remove some of the L5 transverse process mass, so as to provide her with permanent relief of that pain.            Area of pseudoarthrosis shown above.  Post-Procedure Evaluation  Procedure (10/23/2020): Diagnostic bilateral lumbar facet MBB #R1/L2 + diagnostic left L5-sacral ala pseudoarthrosis injection #1 under fluoroscopic guidance and IV sedation Pre-procedure pain level: 6/10 Post-procedure: 0/10 (100% relief)  Sedation: Sedation provided.  Effectiveness during initial hour after procedure(Ultra-Short Term Relief): 100 %.  Local anesthetic used: Long-acting (4-6 hours) Effectiveness: Defined as any analgesic benefit obtained secondary to the administration of local anesthetics. This carries significant diagnostic value as to the etiological location, or anatomical origin, of the pain. Duration of benefit is expected to coincide with the duration of the local anesthetic used.  Effectiveness during initial 4-6 hours after procedure(Short-Term Relief): 100 %.  Long-term benefit: Defined as any relief past the pharmacologic duration of the local anesthetics.  Effectiveness past the initial 6 hours after procedure(Long-Term Relief): 100 % (good relief for 5 days, pain is back but no like it was.).  Current benefits: Defined as benefit that persist at this time.   Analgesia:  >50% relief Function: Somewhat improved ROM: Somewhat improved  Pharmacotherapy Assessment  Analgesic: Oxycodone IR 5 mg, 1 tab PO 3 times daily (maximum of 15 mg/day of oxycodone.) MME/day:  22.5 mg/day.   Monitoring: [place] PMP: PDMP reviewed during this encounter.       Pharmacotherapy: No side-effects or adverse reactions reported. Compliance: No problems identified. Effectiveness: Clinically acceptable. Plan: Refer  to "POC".  UDS:  Summary  Date Value Ref Range Status  04/10/2020 Note  Final    Comment:    ==================================================================== [employer] [facility] 13 (MW) ==================================================================== Test                             Result       Flag       Units Drug Present and Declared for Prescription Verification   Oxycodone                      919          EXPECTED   ng/mg creat   Oxymorphone                    57           EXPECTED   ng/mg creat   Noroxycodone                   3286         EXPECTED   ng/mg creat    Sources of oxycodone include scheduled prescription medications.    Oxymorphone and noroxycodone are expected metabolites of oxycodone.    Oxymorphone is also available as a scheduled prescription medication. ==================================================================== Test                      Result    Flag   Units      Ref Range   Creatinine              168  mg/dL      >=20 ==================================================================== Declared Medications:  The flagging and interpretation on this report are based on the  following declared medications.  Unexpected results may arise from  inaccuracies in the declared medications.  **Note: The testing scope of this panel includes these medications:  Oxycodone (Roxicodone)  **Note: The testing scope of this panel does not include the  following reported medications:  Acetaminophen (Tylenol)  Bupropion (Wellbutrin SR)  Dorzolamide  Doxepin  Esomeprazole (Nexium)  Iron  Multivitamin  Pantoprazole (Protonix)  Sertraline (Zoloft)  Timolol  Vitamin D3  Zolpidem (Ambien) ==================================================================== For clinical consultation, please call [phone]. ====================================================================     Laboratory Chemistry Profile   Renal Lab Results   Component Value Date   BUN 12 10/19/2020   CREATININE 1.38 (H) 10/19/2020   BCR 11 (L) 01/10/2019   GFRAA 46 (L) 01/10/2019   GFRNONAA 38 (L) 10/19/2020     Hepatic Lab Results  Component Value Date   AST 18 01/10/2019   ALT 11 (L) 06/24/2016   ALBUMIN 4.3 01/10/2019   ALKPHOS 74 01/10/2019   LIPASE 139 04/27/2014     Electrolytes Lab Results  Component Value Date   NA 138 10/19/2020   K 4.2 10/19/2020   CL 103 10/19/2020   CALCIUM 9.2 10/19/2020   MG 2.3 01/10/2019     Bone Lab Results  Component Value Date   25OHVITD1 26 (L) 01/10/2019   25OHVITD2 1.6 01/10/2019   25OHVITD3 24 01/10/2019     Inflammation (CRP: Acute Phase) (ESR: Chronic Phase) Lab Results  Component Value Date   CRP 0.9 08/24/2020   ESRSEDRATE 51 (H) 01/10/2019       Note: Above Lab results reviewed.  Imaging  DG PAIN CLINIC C-ARM 1-60 MIN NO REPORT Fluoro was used, but no Radiologist interpretation will be provided.  Please refer to "NOTES" tab for provider progress note.  Assessment  The primary encounter diagnosis was Chronic pain syndrome. Diagnoses of Lumbar facet syndrome (Bilateral) (L>R), Grade 1 spondylolisthesis of L4 over L5, Chronic Bennett back pain (1ry area of Pain) (Bilateral) (L>R) w/o sciatica, Chronic hip pain (Left), and Chronic Bennett back pain (Left) w/o sciatica were also pertinent to this visit.  Plan of Care  Problem-specific:  No problem-specific Assessment & Plan notes found for this encounter.  Ms. MEEGAN SHANAFELT has a current medication list which includes the following long-term medication(s): bupropion, doxepin, cvs slow release iron, [START ON 12/10/2020] oxycodone, [START ON 11/10/2020] oxycodone, pantoprazole, and zolpidem.  Pharmacotherapy (Medications Ordered): No orders of the defined types were placed in this encounter.  Orders:  No orders of the defined types were placed in this encounter.  Follow-up plan:   Return in about 9 weeks (around 01/09/2021) for  (F2F), (Med Mgmt).      Interventional Therapies  Risk  Complexity Considerations:   Advanced age   Planned  Pending:   Diagnostic left L5 transverse process-sacral ala pseudoarthrosis injection.  If he ends up getting good relief of the pain, at least for the duration of the numbing medicine, she may be a good candidate for corrective surgery of the affected area.   Under consideration:   Diagnostic right lumbar facet block #1  Diagnostic right ischial bursa injection #1  Possible bilateral lumbar facet RFA Possible bilateral sacroiliac joint RFA Diagnostic left L4 TFESI  Diagnostic left IA hip joint injection  Diagnostic bilateral femoral nerve + obturator NB Possible bilateral femoral nerve + obturator nerve RFA   Completed:  Therapeutic left L4-5 LESI x6 (04/10/2020)  Therapeutic right L4-5 LESI x1 (07/21/2017)  Palliative right L5-S1 LESI x1 (09/27/2015)  Therapeutic left L5-S1 LESI x1 (02/05/2016)  Diagnostic right SI joint injection x2 (08/25/2019)  Diagnostic left SI joint injection x2 (01/27/2019)  Diagnostic left glenohumeral joint injection x1 (04/29/2017)  Left biceps muscle trigger point injection x1 (07/21/2017)  Right PSIS trigger point injection x1 (07/27/2018)  Diagnostic left lumbar facet block x1 (05/25/2018)  Diagnostic left IA hip injection x1 (01/27/2019)    Palliative options:   None at this time     Recent Visits Date Type Provider Dept  10/23/20 Procedure visit Milinda Pointer, MD [facility]  10/22/20 Office Visit Milinda Pointer, MD [facility]  10/11/20 Telemedicine Milinda Pointer, MD [facility]  Showing recent visits within past 90 days and meeting all other requirements Today's Visits Date Type Provider Dept  11/07/20 Telemedicine Milinda Pointer, MD [facility]  Showing today's visits and meeting all other requirements Future Appointments No visits were found meeting these  conditions. Showing future appointments within next 90 days and meeting all other requirements  I discussed the assessment and treatment plan with the patient. The patient was provided an opportunity to ask questions and all were answered. The patient agreed with the plan and demonstrated an understanding of the instructions.  Patient advised to call back or seek an in-person evaluation if the symptoms or condition worsens.  Duration of encounter: 18 minutes.  Note by: Gaspar Cola, MD Date: 11/07/2020; Time: 4:21 PM

## 2020-11-07 ENCOUNTER — Other Ambulatory Visit: Payer: Self-pay

## 2020-11-07 ENCOUNTER — Ambulatory Visit: Payer: PPO | Attending: Pain Medicine | Admitting: Pain Medicine

## 2020-11-07 DIAGNOSIS — M545 Low back pain, unspecified: Secondary | ICD-10-CM

## 2020-11-07 DIAGNOSIS — M47816 Spondylosis without myelopathy or radiculopathy, lumbar region: Secondary | ICD-10-CM | POA: Diagnosis not present

## 2020-11-07 DIAGNOSIS — G894 Chronic pain syndrome: Secondary | ICD-10-CM | POA: Diagnosis not present

## 2020-11-07 DIAGNOSIS — G8929 Other chronic pain: Secondary | ICD-10-CM | POA: Diagnosis not present

## 2020-11-07 DIAGNOSIS — M25552 Pain in left hip: Secondary | ICD-10-CM

## 2020-11-07 DIAGNOSIS — M4316 Spondylolisthesis, lumbar region: Secondary | ICD-10-CM | POA: Diagnosis not present

## 2020-12-31 ENCOUNTER — Telehealth: Payer: Self-pay

## 2020-12-31 NOTE — Telephone Encounter (Signed)
error 

## 2021-01-04 ENCOUNTER — Other Ambulatory Visit: Payer: Self-pay | Admitting: Pain Medicine

## 2021-01-04 DIAGNOSIS — G894 Chronic pain syndrome: Secondary | ICD-10-CM

## 2021-01-07 ENCOUNTER — Encounter: Payer: PPO | Admitting: Pain Medicine

## 2021-01-07 DIAGNOSIS — R232 Flushing: Secondary | ICD-10-CM | POA: Diagnosis not present

## 2021-01-07 DIAGNOSIS — M797 Fibromyalgia: Secondary | ICD-10-CM | POA: Diagnosis not present

## 2021-01-07 DIAGNOSIS — D509 Iron deficiency anemia, unspecified: Secondary | ICD-10-CM | POA: Diagnosis not present

## 2021-01-07 DIAGNOSIS — E559 Vitamin D deficiency, unspecified: Secondary | ICD-10-CM | POA: Diagnosis not present

## 2021-01-07 DIAGNOSIS — K219 Gastro-esophageal reflux disease without esophagitis: Secondary | ICD-10-CM | POA: Diagnosis not present

## 2021-01-07 DIAGNOSIS — R000 Tachycardia, unspecified: Secondary | ICD-10-CM | POA: Diagnosis not present

## 2021-01-07 DIAGNOSIS — E785 Hyperlipidemia, unspecified: Secondary | ICD-10-CM | POA: Diagnosis not present

## 2021-01-18 ENCOUNTER — Inpatient Hospital Stay: Payer: PPO

## 2021-01-18 ENCOUNTER — Other Ambulatory Visit: Payer: Self-pay

## 2021-01-18 ENCOUNTER — Inpatient Hospital Stay: Payer: PPO | Attending: Internal Medicine

## 2021-01-18 ENCOUNTER — Inpatient Hospital Stay (HOSPITAL_BASED_OUTPATIENT_CLINIC_OR_DEPARTMENT_OTHER): Payer: PPO | Admitting: Internal Medicine

## 2021-01-18 VITALS — BP 127/83 | HR 87

## 2021-01-18 DIAGNOSIS — Z886 Allergy status to analgesic agent status: Secondary | ICD-10-CM | POA: Diagnosis not present

## 2021-01-18 DIAGNOSIS — N1832 Chronic kidney disease, stage 3b: Secondary | ICD-10-CM | POA: Insufficient documentation

## 2021-01-18 DIAGNOSIS — Z87891 Personal history of nicotine dependence: Secondary | ICD-10-CM | POA: Diagnosis not present

## 2021-01-18 DIAGNOSIS — R5383 Other fatigue: Secondary | ICD-10-CM | POA: Diagnosis not present

## 2021-01-18 DIAGNOSIS — Z8673 Personal history of transient ischemic attack (TIA), and cerebral infarction without residual deficits: Secondary | ICD-10-CM | POA: Diagnosis not present

## 2021-01-18 DIAGNOSIS — Z836 Family history of other diseases of the respiratory system: Secondary | ICD-10-CM | POA: Diagnosis not present

## 2021-01-18 DIAGNOSIS — D631 Anemia in chronic kidney disease: Secondary | ICD-10-CM | POA: Insufficient documentation

## 2021-01-18 DIAGNOSIS — E611 Iron deficiency: Secondary | ICD-10-CM

## 2021-01-18 DIAGNOSIS — R109 Unspecified abdominal pain: Secondary | ICD-10-CM | POA: Diagnosis not present

## 2021-01-18 DIAGNOSIS — M549 Dorsalgia, unspecified: Secondary | ICD-10-CM | POA: Diagnosis not present

## 2021-01-18 DIAGNOSIS — G8929 Other chronic pain: Secondary | ICD-10-CM | POA: Insufficient documentation

## 2021-01-18 DIAGNOSIS — Z79899 Other long term (current) drug therapy: Secondary | ICD-10-CM | POA: Diagnosis not present

## 2021-01-18 DIAGNOSIS — Z9049 Acquired absence of other specified parts of digestive tract: Secondary | ICD-10-CM | POA: Insufficient documentation

## 2021-01-18 DIAGNOSIS — Z818 Family history of other mental and behavioral disorders: Secondary | ICD-10-CM | POA: Insufficient documentation

## 2021-01-18 DIAGNOSIS — M2550 Pain in unspecified joint: Secondary | ICD-10-CM | POA: Insufficient documentation

## 2021-01-18 LAB — CBC WITH DIFFERENTIAL/PLATELET
Abs Immature Granulocytes: 0.04 10*3/uL (ref 0.00–0.07)
Basophils Absolute: 0.1 10*3/uL (ref 0.0–0.1)
Basophils Relative: 1 %
Eosinophils Absolute: 0.4 10*3/uL (ref 0.0–0.5)
Eosinophils Relative: 4 %
HCT: 37.6 % (ref 36.0–46.0)
Hemoglobin: 11.9 g/dL — ABNORMAL LOW (ref 12.0–15.0)
Immature Granulocytes: 1 %
Lymphocytes Relative: 27 %
Lymphs Abs: 2.2 10*3/uL (ref 0.7–4.0)
MCH: 26.6 pg (ref 26.0–34.0)
MCHC: 31.6 g/dL (ref 30.0–36.0)
MCV: 84.1 fL (ref 80.0–100.0)
Monocytes Absolute: 0.7 10*3/uL (ref 0.1–1.0)
Monocytes Relative: 8 %
Neutro Abs: 5 10*3/uL (ref 1.7–7.7)
Neutrophils Relative %: 59 %
Platelets: 413 10*3/uL — ABNORMAL HIGH (ref 150–400)
RBC: 4.47 MIL/uL (ref 3.87–5.11)
RDW: 16.6 % — ABNORMAL HIGH (ref 11.5–15.5)
WBC: 8.4 10*3/uL (ref 4.0–10.5)
nRBC: 0 % (ref 0.0–0.2)

## 2021-01-18 LAB — BASIC METABOLIC PANEL
Anion gap: 8 (ref 5–15)
BUN: 12 mg/dL (ref 8–23)
CO2: 22 mmol/L (ref 22–32)
Calcium: 9.2 mg/dL (ref 8.9–10.3)
Chloride: 105 mmol/L (ref 98–111)
Creatinine, Ser: 1.11 mg/dL — ABNORMAL HIGH (ref 0.44–1.00)
GFR, Estimated: 49 mL/min — ABNORMAL LOW (ref >60)
Glucose, Bld: 90 mg/dL (ref 70–99)
Potassium: 4.2 mmol/L (ref 3.5–5.1)
Sodium: 135 mmol/L (ref 135–145)

## 2021-01-18 LAB — IRON AND TIBC
Iron: 36 ug/dL (ref 28–170)
Saturation Ratios: 10 % — ABNORMAL LOW (ref 10.4–31.8)
TIBC: 346 ug/dL (ref 250–450)
UIBC: 310 ug/dL

## 2021-01-18 LAB — FERRITIN: Ferritin: 29 ng/mL (ref 11–307)

## 2021-01-18 MED ORDER — SODIUM CHLORIDE 0.9 % IV SOLN
200.0000 mg | Freq: Once | INTRAVENOUS | Status: DC
  Filled 2021-01-18: qty 10

## 2021-01-18 MED ORDER — SODIUM CHLORIDE 0.9 % IV SOLN
Freq: Once | INTRAVENOUS | Status: AC
Start: 2021-01-18 — End: 2021-01-18
  Filled 2021-01-18: qty 250

## 2021-01-18 MED ORDER — IRON SUCROSE 20 MG/ML IV SOLN
200.0000 mg | Freq: Once | INTRAVENOUS | Status: AC
  Administered 2021-01-18: 200 mg via INTRAVENOUS
  Filled 2021-01-18: qty 10

## 2021-01-18 NOTE — Progress Notes (Signed)
[facility] CONSULT NOTE  Patient Care Team: Jerl Mina, MD as PCP - General (Family Medicine) Earna Coder, MD as Consulting Physician (Hematology and Oncology)  CHIEF COMPLAINTS/PURPOSE OF CONSULTATION: Anemia   HEMATOLOGY HISTORY  # CHRONIC NORMOCYTIC ANEMIA [since 2019-PCP- Hb 8-9; Iron sat- 5%;NO Ferritin; GFR- 43]EGD/colonoscopy-?2013-2104; IV Venofer-  #CKD stage III  HISTORY OF PRESENTING ILLNESS:  Cynthia Bennett 87 y.o.  female history of anemia secondary chronic kidney disease is here for follow-up.  Patient notes that improvement of energy levels post IV iron.  No nausea no vomiting.  Mild abdominal discomfort with p.o. iron.  No headaches.  Review of Systems  Constitutional: Positive for malaise/fatigue. Negative for chills, diaphoresis and fever.  HENT: Negative for nosebleeds and sore throat.   Eyes: Negative for double vision.  Respiratory: Negative for cough, hemoptysis, sputum production, shortness of breath and wheezing.   Cardiovascular: Negative for chest pain, palpitations, orthopnea and leg swelling.  Gastrointestinal: Negative for abdominal pain, blood in stool, constipation, diarrhea, heartburn, melena, nausea and vomiting.  Genitourinary: Negative for dysuria, frequency and urgency.  Musculoskeletal: Positive for back pain and joint pain.  Skin: Negative.  Negative for itching and rash.  Neurological: Negative for dizziness, tingling, focal weakness, weakness and headaches.  Endo/Heme/Allergies: Does not bruise/bleed easily.  Psychiatric/Behavioral: Negative for depression. The patient is not nervous/anxious and does not have insomnia.     MEDICAL HISTORY:  Past Medical History:  Diagnosis Date  . Dizzy spells   . Fibromyalgia   . Glaucoma   . Hiatal hernia   . Osteoarthritis   . Osteoarthritis of spine with radiculopathy, lumbar region 08/14/2015  . Peptic ulcer disease   . Reflux   . Spinal stenosis   . Stroke ([facility])    . TIA (transient ischemic attack)     SURGICAL HISTORY: Past Surgical History:  Procedure Laterality Date  . ABDOMINAL HYSTERECTOMY    . APPENDECTOMY    . ESOPHAGOGASTRODUODENOSCOPY N/A 04/16/2015   Procedure: ESOPHAGOGASTRODUODENOSCOPY (EGD);  Surgeon: Wallace Cullens, MD;  Location: [facility] ENDOSCOPY;  Service: Gastroenterology;  Laterality: N/A;    SOCIAL HISTORY: Social History   Socioeconomic History  . Marital status: Widowed    Spouse name: Not on file  . Number of children: Not on file  . Years of education: Not on file  . Highest education level: Not on file  Occupational History  . Not on file  Tobacco Use  . Smoking status: Former Games developer  . Smokeless tobacco: Never Used  Substance and Sexual Activity  . Alcohol use: No    Alcohol/week: 0.0 standard drinks  . Drug use: No  . Sexual activity: Not on file  Other Topics Concern  . Not on file  Social History Narrative   Pt lives in [place]; with son. Quit smoking in 1957. No alcohol. Designed clothes/ alterations.    Social Determinants of Health   Financial Resource Strain: Not on file  Food Insecurity: Not on file  Transportation Needs: Not on file  Physical Activity: Not on file  Stress: Not on file  Social Connections: Not on file  Intimate Partner Violence: Not on file    FAMILY HISTORY: Family History  Problem Relation Age of Onset  . Mental illness Mother   . COPD Father     ALLERGIES:  is allergic to ibuprofen.  MEDICATIONS:  Current Outpatient Medications  Medication Sig Dispense Refill  . acetaminophen (TYLENOL) 325 MG tablet Take 650 mg by mouth every 6 (  six) hours as needed.    Marland Kitchen buPROPion (WELLBUTRIN SR) 150 MG 12 hr tablet TAKE 1 TABLET BY MOUTH ONCE DAILY    . Cholecalciferol (VITAMIN D3) 20 MCG (800 UNIT) TABS Take by mouth 2 (two) times daily.    . cyanocobalamin (,VITAMIN B-12,) 1000 MCG/ML injection Inject 1,000 mcg into the muscle every 30 (thirty) days.    Marland Kitchen doxepin (SINEQUAN) 100 MG  capsule Take 100 mg by mouth at bedtime.    Marland Kitchen esomeprazole (NEXIUM) 40 MG capsule Take 40 mg by mouth 2 (two) times daily.    . Ferrous Sulfate ([facility] SLOW RELEASE IRON) 143 (45 Fe) MG TBCR Take by mouth daily.    . Multiple Vitamin (MULTIVITAMIN) tablet Take 1 tablet by mouth daily.    . ondansetron (ZOFRAN) 8 MG tablet One pill every 8 hours as needed for nausea/vomitting. 40 tablet 1  . oxyCODONE (OXY IR/ROXICODONE) 5 MG immediate release tablet Take 1 tablet (5 mg total) by mouth every 8 (eight) hours as needed for severe pain. Must last 30 days 90 tablet 0  . pantoprazole (PROTONIX) 40 MG tablet Take 1 tablet by mouth 2 (two) times daily.  11  . oxyCODONE (OXY IR/ROXICODONE) 5 MG immediate release tablet Take 1 tablet (5 mg total) by mouth every 8 (eight) hours as needed for severe pain. Must last 30 days 90 tablet 0  . zolpidem (AMBIEN) 10 MG tablet TAKE 1/2 TO 1 TABLET BY MOUTH EVERY NIGHT AT BEDTIME     No current facility-administered medications for this visit.      PHYSICAL EXAMINATION:   Vitals:   01/18/21 1200  BP: (!) 151/69  Pulse: 96  Resp: 20  Temp: 99.3 F (37.4 C)  SpO2: 100%   Filed Weights   01/18/21 1300  Weight: 158 lb (71.7 kg)    Physical Exam HENT:     Head: Normocephalic and atraumatic.     Mouth/Throat:     Pharynx: No oropharyngeal exudate.  Eyes:     Pupils: Pupils are equal, round, and reactive to light.  Cardiovascular:     Rate and Rhythm: Normal rate and regular rhythm.  Pulmonary:     Effort: Pulmonary effort is normal. No respiratory distress.     Breath sounds: Normal breath sounds. No wheezing.  Abdominal:     General: Bowel sounds are normal. There is no distension.     Palpations: Abdomen is soft. There is no mass.     Tenderness: There is no abdominal tenderness. There is no guarding or rebound.  Musculoskeletal:        General: No tenderness. Normal range of motion.     Cervical back: Normal range of motion and neck supple.   Skin:    General: Skin is warm.  Neurological:     Mental Status: She is alert and oriented to person, place, and time.  Psychiatric:        Mood and Affect: Affect normal.     LABORATORY DATA:  I have reviewed the data as listed Lab Results  Component Value Date   WBC 8.4 01/18/2021   HGB 11.9 (L) 01/18/2021   HCT 37.6 01/18/2021   MCV 84.1 01/18/2021   PLT 413 (H) 01/18/2021   Recent Labs    10/19/20 1238 01/18/21 1230  NA 138 135  K 4.2 4.2  CL 103 105  CO2 29 22  GLUCOSE 106* 90  BUN 12 12  CREATININE 1.38* 1.11*  CALCIUM 9.2 9.2  GFRNONAA  38* 49*     No results found.  Anemia due to stage 3b chronic kidney disease (HCC) # Anemia iron def/CKD stageIII : Hb-8-9 [MCV- 77] s/p IV iron infusion hemoglobin up to 11.2  Proceed with Venofer today. PO iron discomfort [recommend PO every otherday]  # CKDstage III   # Chronic back pain/? scaitica [Dr.Neivera]-stable.  # DISPOSITION: # Venofer today # follow up in 4 months- MD; labs- cbc/bmp- possible IVvenofer- Dr.B  Cc; Dr.Hedrick     All questions were answered. The patient knows to call the clinic with any problems, questions or concerns.      Earna Coder, MD 01/18/2021 1:29 PM

## 2021-01-18 NOTE — Assessment & Plan Note (Addendum)
#   Anemia iron def/CKD stageIII : Hb-8-9 [MCV- 77] s/p IV iron infusion hemoglobin up to 11.2  Proceed with Venofer today. PO iron discomfort [recommend PO every otherday]  # CKDstage III   # Chronic back pain/? scaitica [Dr.Neivera]-stable.  # DISPOSITION: # Venofer today # follow up in 4 months- MD; labs- cbc/bmp- possible IVvenofer- Dr.B  Cc; Dr.Hedrick

## 2021-02-05 NOTE — Progress Notes (Deleted)
No show to appointment.

## 2021-02-06 ENCOUNTER — Encounter: Payer: PPO | Admitting: Pain Medicine

## 2021-02-06 DIAGNOSIS — Z79891 Long term (current) use of opiate analgesic: Secondary | ICD-10-CM | POA: Insufficient documentation

## 2021-02-06 NOTE — Patient Instructions (Incomplete)
____________________________________________________________________________________________  Medication Rules  Purpose: To inform patients, and their family members, of our rules and regulations.  Applies to: All patients receiving prescriptions (written or electronic).  Pharmacy of record: Pharmacy where electronic prescriptions will be sent. If written prescriptions are taken to a different pharmacy, please inform the nursing staff. The pharmacy listed in the electronic medical record should be the one where you would like electronic prescriptions to be sent.  Electronic prescriptions: In compliance with the [place] Strengthen Opioid Misuse Prevention (STOP) Act of 2017 (Session Law 2017-74/H243), effective November 10, 2018, all controlled substances must be electronically prescribed. Calling prescriptions to the pharmacy will cease to exist.  Prescription refills: Only during scheduled appointments. Applies to all prescriptions.  NOTE: The following applies primarily to controlled substances (Opioid* Pain Medications).   Type of encounter (visit): For patients receiving controlled substances, face-to-face visits are required. (Not an option or up to the patient.)  Patient's responsibilities: 1. Pain Pills: Bring all pain pills to every appointment (except for procedure appointments). 2. Pill Bottles: Bring pills in original pharmacy bottle. Always bring the newest bottle. Bring bottle, even if empty. 3. Medication refills: You are responsible for knowing and keeping track of what medications you take and those you need refilled. The day before your appointment: write a list of all prescriptions that need to be refilled. The day of the appointment: give the list to the admitting nurse. Prescriptions will be written only during appointments. No prescriptions will be written on procedure days. If you forget a medication: it will not be "Called in", "Faxed", or "electronically sent".  You will need to get another appointment to get these prescribed. No early refills. Do not call asking to have your prescription filled early. 4. Prescription Accuracy: You are responsible for carefully inspecting your prescriptions before leaving our office. Have the discharge nurse carefully go over each prescription with you, before taking them home. Make sure that your name is accurately spelled, that your address is correct. Check the name and dose of your medication to make sure it is accurate. Check the number of pills, and the written instructions to make sure they are clear and accurate. Make sure that you are given enough medication to last until your next medication refill appointment. 5. Taking Medication: Take medication as prescribed. When it comes to controlled substances, taking less pills or less frequently than prescribed is permitted and encouraged. Never take more pills than instructed. Never take medication more frequently than prescribed.  6. Inform other Doctors: Always inform, all of your healthcare providers, of all the medications you take. 7. Pain Medication from other Providers: You are not allowed to accept any additional pain medication from any other Doctor or Healthcare provider. There are two exceptions to this rule. (see below) In the event that you require additional pain medication, you are responsible for notifying us, as stated below. 8. Cough Medicine: Often these contain an opioid, such as codeine or hydrocodone. Never accept or take cough medicine containing these opioids if you are already taking an opioid* medication. The combination may cause respiratory failure and death. 9. Medication Agreement: You are responsible for carefully reading and following our Medication Agreement. This must be signed before receiving any prescriptions from our practice. Safely store a copy of your signed Agreement. Violations to the Agreement will result in no further prescriptions.  (Additional copies of our Medication Agreement are available upon request.) 10. Laws, Rules, & Regulations: All patients are expected to follow all   Federal and State Laws, Statutes, Rules, & Regulations. Ignorance of the Laws does not constitute a valid excuse.  11. Illegal drugs and Controlled Substances: The use of illegal substances (including, but not limited to marijuana and its derivatives) and/or the illegal use of any controlled substances is strictly prohibited. Violation of this rule may result in the immediate and permanent discontinuation of any and all prescriptions being written by our practice. The use of any illegal substances is prohibited. 12. Adopted CDC guidelines & recommendations: Target dosing levels will be at or below 60 MME/day. Use of benzodiazepines** is not recommended.  Exceptions: There are only two exceptions to the rule of not receiving pain medications from other Healthcare Providers. 1. Exception #1 (Emergencies): In the event of an emergency (i.e.: accident requiring emergency care), you are allowed to receive additional pain medication. However, you are responsible for: As soon as you are able, call our office [phone], at any time of the day or night, and leave a message stating your name, the date and nature of the emergency, and the name and dose of the medication prescribed. In the event that your call is answered by a member of our staff, make sure to document and save the date, time, and the name of the person that took your information.  2. Exception #2 (Planned Surgery): In the event that you are scheduled by another doctor or dentist to have any type of surgery or procedure, you are allowed (for a period no longer than 30 days), to receive additional pain medication, for the acute post-op pain. However, in this case, you are responsible for picking up a copy of our "Post-op Pain Management for Surgeons" handout, and giving it to your surgeon or dentist. This  document is available at our office, and does not require an appointment to obtain it. Simply go to our office during business hours (Monday-Thursday from 8:00 AM to 4:00 PM) (Friday 8:00 AM to 12:00 Noon) or if you have a scheduled appointment with us, prior to your surgery, and ask for it by name. In addition, you are responsible for: calling our office [phone], at any time of the day or night, and leaving a message stating your name, name of your surgeon, type of surgery, and date of procedure or surgery. Failure to comply with your responsibilities may result in termination of therapy involving the controlled substances.  *Opioid medications include: morphine, codeine, oxycodone, oxymorphone, hydrocodone, hydromorphone, meperidine, tramadol, tapentadol, buprenorphine, fentanyl, methadone. **Benzodiazepine medications include: diazepam (Valium), alprazolam (Xanax), clonazepam (Klonopine), lorazepam (Ativan), clorazepate (Tranxene), chlordiazepoxide (Librium), estazolam (Prosom), oxazepam (Serax), temazepam (Restoril), triazolam (Halcion) (Last updated: 10/08/2020) ____________________________________________________________________________________________   ____________________________________________________________________________________________  Medication Recommendations and Reminders  Applies to: All patients receiving prescriptions (written and/or electronic).  Medication Rules & Regulations: These rules and regulations exist for your safety and that of others. They are not flexible and neither are we. Dismissing or ignoring them will be considered "non-compliance" with medication therapy, resulting in complete and irreversible termination of such therapy. (See document titled "Medication Rules" for more details.) In all conscience, because of safety reasons, we cannot continue providing a therapy where the patient does not follow instructions.  Pharmacy of record:   Definition:  This is the pharmacy where your electronic prescriptions will be sent.   We do not endorse any particular pharmacy, however, we have experienced problems with [facility] not securing enough medication supply for the community.  We do not restrict you in your choice of pharmacy. However,   once we write for your prescriptions, we will NOT be re-sending more prescriptions to fix restricted supply problems created by your pharmacy, or your insurance.   The pharmacy listed in the electronic medical record should be the one where you want electronic prescriptions to be sent.  If you choose to change pharmacy, simply notify our nursing staff.  Recommendations:  Keep all of your pain medications in a safe place, under lock and key, even if you live alone. We will NOT replace lost, stolen, or damaged medication.  After you fill your prescription, take 1 week's worth of pills and put them away in a safe place. You should keep a separate, properly labeled bottle for this purpose. The remainder should be kept in the original bottle. Use this as your primary supply, until it runs out. Once it's gone, then you know that you have 1 week's worth of medicine, and it is time to come in for a prescription refill. If you do this correctly, it is unlikely that you will ever run out of medicine.  To make sure that the above recommendation works, it is very important that you make sure your medication refill appointments are scheduled at least 1 week before you run out of medicine. To do this in an effective manner, make sure that you do not leave the office without scheduling your next medication management appointment. Always ask the nursing staff to show you in your prescription , when your medication will be running out. Then arrange for the receptionist to get you a return appointment, at least 7 days before you run out of medicine. Do not wait until you have 1 or 2 pills left, to come in. This is very poor planning and  does not take into consideration that we may need to cancel appointments due to bad weather, sickness, or emergencies affecting our staff.  DO NOT ACCEPT A "Partial Fill": If for any reason your pharmacy does not have enough pills/tablets to completely fill or refill your prescription, do not allow for a "partial fill". The law allows the pharmacy to complete that prescription within 72 hours, without requiring a new prescription. If they do not fill the rest of your prescription within those 72 hours, you will need a separate prescription to fill the remaining amount, which we will NOT provide. If the reason for the partial fill is your insurance, you will need to talk to the pharmacist about payment alternatives for the remaining tablets, but again, DO NOT ACCEPT A PARTIAL FILL, unless you can trust your pharmacist to obtain the remainder of the pills within 72 hours.  Prescription refills and/or changes in medication(s):   Prescription refills, and/or changes in dose or medication, will be conducted only during scheduled medication management appointments. (Applies to both, written and electronic prescriptions.)  No refills on procedure days. No medication will be changed or started on procedure days. No changes, adjustments, and/or refills will be conducted on a procedure day. Doing so will interfere with the diagnostic portion of the procedure.  No phone refills. No medications will be "called into the pharmacy".  No Fax refills.  No weekend refills.  No Holliday refills.  No after hours refills.  Remember:  Business hours are:  Monday to Thursday 8:00 AM to 4:00 PM Provider's Schedule: Jhamari Markowicz, MD - Appointments are:  Medication management: Monday and Wednesday 8:00 AM to 4:00 PM Procedure day: Tuesday and Thursday 7:30 AM to 4:00 PM Bilal Lateef, MD - Appointments are:    Medication management: Tuesday and Thursday 8:00 AM to 4:00 PM Procedure day: Monday and Wednesday  7:30 AM to 4:00 PM (Last update: 05/30/2020) ____________________________________________________________________________________________   ____________________________________________________________________________________________  CBD (cannabidiol) WARNING  Applicable to: All individuals currently taking or considering taking CBD (cannabidiol) and, more important, all patients taking opioid analgesic controlled substances (pain medication). (Example: oxycodone; oxymorphone; hydrocodone; hydromorphone; morphine; methadone; tramadol; tapentadol; fentanyl; buprenorphine; butorphanol; dextromethorphan; meperidine; codeine; etc.)  Legal status: CBD remains a Schedule I drug prohibited for any use. CBD is illegal with one exception. In the [place], CBD has a limited Food and Drug Administration (FDA) approval for the treatment of two specific types of epilepsy disorders. Only one CBD product has been approved by the FDA for this purpose: "Epidiolex". FDA is aware that some companies are marketing products containing cannabis and cannabis-derived compounds in ways that violate the Federal Food, Drug and Cosmetic Act (FD&C Act) and that may put the health and safety of consumers at risk. The FDA, a Federal agency, has not enforced the CBD status since 2018.   Legality: Some manufacturers ship CBD products nationally, which is illegal. Often such products are sold online and are therefore available throughout the country. CBD is openly sold in head shops and health food stores in some states where such sales have not been explicitly legalized. Selling unapproved products with unsubstantiated therapeutic claims is not only a violation of the law, but also can put patients at risk, as these products have not been proven to be safe or effective. Federal illegality makes it difficult to conduct research on CBD.  Reference: "FDA Regulation of Cannabis and Cannabis-Derived Products, Including Cannabidiol  (CBD)" - https://www.fda.gov/news-events/public-health-focus/fda-regulation-cannabis-and-cannabis-derived-products-including-cannabidiol-cbd  Warning: CBD is not FDA approved and has not undergo the same manufacturing controls as prescription drugs.  This means that the purity and safety of available CBD may be questionable. Most of the time, despite manufacturer's claims, it is contaminated with THC (delta-9-tetrahydrocannabinol - the chemical in marijuana responsible for the "HIGH").  When this is the case, the THC contaminant will trigger a positive urine drug screen (UDS) test for Marijuana (carboxy-THC). Because a positive UDS for any illicit substance is a violation of our medication agreement, your opioid analgesics (pain medicine) may be permanently discontinued.  MORE ABOUT CBD  General Information: CBD  is a derivative of the Marijuana (cannabis sativa) plant discovered in 1940. It is one of the 113 identified substances found in Marijuana. It accounts for up to 40% of the plant's extract. As of 2018, preliminary clinical studies on CBD included research for the treatment of anxiety, movement disorders, and pain. CBD is available and consumed in multiple forms, including inhalation of smoke or vapor, as an aerosol spray, and by mouth. It may be supplied as an oil containing CBD, capsules, dried cannabis, or as a liquid solution. CBD is thought not to be as psychoactive as THC (delta-9-tetrahydrocannabinol - the chemical in marijuana responsible for the "HIGH"). Studies suggest that CBD may interact with different biological target receptors in the body, including cannabinoid and other neurotransmitter receptors. As of 2018 the mechanism of action for its biological effects has not been determined.  Side-effects  Adverse reactions: Dry mouth, diarrhea, decreased appetite, fatigue, drowsiness, malaise, weakness, sleep disturbances, and others.  Drug interactions: CBC may interact with other  medications such as blood-thinners. (Last update: 06/16/2020) ____________________________________________________________________________________________    

## 2021-02-21 ENCOUNTER — Ambulatory Visit: Payer: PPO | Attending: Pain Medicine | Admitting: Pain Medicine

## 2021-02-21 ENCOUNTER — Encounter: Payer: Self-pay | Admitting: Pain Medicine

## 2021-02-21 ENCOUNTER — Other Ambulatory Visit: Payer: Self-pay

## 2021-02-21 VITALS — BP 119/83 | HR 112 | Temp 97.0°F | Ht 63.0 in | Wt 150.0 lb

## 2021-02-21 DIAGNOSIS — G894 Chronic pain syndrome: Secondary | ICD-10-CM | POA: Diagnosis not present

## 2021-02-21 DIAGNOSIS — M4316 Spondylolisthesis, lumbar region: Secondary | ICD-10-CM | POA: Diagnosis not present

## 2021-02-21 DIAGNOSIS — Z79899 Other long term (current) drug therapy: Secondary | ICD-10-CM

## 2021-02-21 DIAGNOSIS — M47816 Spondylosis without myelopathy or radiculopathy, lumbar region: Secondary | ICD-10-CM | POA: Diagnosis not present

## 2021-02-21 DIAGNOSIS — F1120 Opioid dependence, uncomplicated: Secondary | ICD-10-CM

## 2021-02-21 DIAGNOSIS — Z79891 Long term (current) use of opiate analgesic: Secondary | ICD-10-CM

## 2021-02-21 DIAGNOSIS — G8929 Other chronic pain: Secondary | ICD-10-CM | POA: Diagnosis not present

## 2021-02-21 DIAGNOSIS — M545 Low back pain, unspecified: Secondary | ICD-10-CM

## 2021-02-21 MED ORDER — OXYCODONE HCL 5 MG PO TABS: 5.0000 mg | ORAL_TABLET | Freq: Three times a day (TID) | ORAL | 0 refills | Status: DC | PRN

## 2021-02-21 NOTE — Progress Notes (Signed)
Nursing Pain Medication Assessment:  Safety precautions to be maintained throughout the outpatient stay will include: orient to surroundings, keep bed in low position, maintain call bell within reach at all times, provide assistance with transfer out of bed and ambulation.  Medication Inspection Compliance: Pill count conducted under aseptic conditions, in front of the patient. Neither the pills nor the bottle was removed from the patient's sight at any time. Once count was completed pills were immediately returned to the patient in their original bottle.  Medication: Oxycodone IR Pill/Patch Count: 14 of 90 pills remain Pill/Patch Appearance: Markings consistent with prescribed medication Bottle Appearance: Standard pharmacy container. Clearly labeled. Filled Date: 2 / 25 / 22 Last Medication intake:  TodaySafety precautions to be maintained throughout the outpatient stay will include: orient to surroundings, keep bed in low position, maintain call bell within reach at all times, provide assistance with transfer out of bed and ambulation.

## 2021-02-21 NOTE — Patient Instructions (Signed)
____________________________________________________________________________________________  Preparing for Procedure with Sedation  Procedure appointments are limited to planned procedures: . No Prescription Refills. . No disability issues will be discussed. . No medication changes will be discussed.  Instructions: . Oral Intake: Do not eat or drink anything for at least 8 hours prior to your procedure. (Exception: Blood Pressure Medication. See below.) . Transportation: Unless otherwise stated by your physician, you may drive yourself after the procedure. . Blood Pressure Medicine: Do not forget to take your blood pressure medicine with a sip of water the morning of the procedure. If your Diastolic (lower reading)is above 100 mmHg, elective cases will be cancelled/rescheduled. . Blood thinners: These will need to be stopped for procedures. Notify our staff if you are taking any blood thinners. Depending on which one you take, there will be specific instructions on how and when to stop it. . Diabetics on insulin: Notify the staff so that you can be scheduled 1st case in the morning. If your diabetes requires high dose insulin, take only  of your normal insulin dose the morning of the procedure and notify the staff that you have done so. . Preventing infections: Shower with an antibacterial soap the morning of your procedure. . Build-up your immune system: Take 1000 mg of Vitamin C with every meal (3 times a day) the day prior to your procedure. . Antibiotics: Inform the staff if you have a condition or reason that requires you to take antibiotics before dental procedures. . Pregnancy: If you are pregnant, call and cancel the procedure. . Sickness: If you have a cold, fever, or any active infections, call and cancel the procedure. . Arrival: You must be in the facility at least 30 minutes prior to your scheduled procedure. . Children: Do not bring children with you. . Dress appropriately:  Bring dark clothing that you would not mind if they get stained. . Valuables: Do not bring any jewelry or valuables.  Reasons to call and reschedule or cancel your procedure: (Following these recommendations will minimize the risk of a serious complication.) . Surgeries: Avoid having procedures within 2 weeks of any surgery. (Avoid for 2 weeks before or after any surgery). . Flu Shots: Avoid having procedures within 2 weeks of a flu shots or . (Avoid for 2 weeks before or after immunizations). . Barium: Avoid having a procedure within 7-10 days after having had a radiological study involving the use of radiological contrast. (Myelograms, Barium swallow or enema study). . Heart attacks: Avoid any elective procedures or surgeries for the initial 6 months after a "Myocardial Infarction" (Heart Attack). . Blood thinners: It is imperative that you stop these medications before procedures. Let us know if you if you take any blood thinner.  . Infection: Avoid procedures during or within two weeks of an infection (including chest colds or gastrointestinal problems). Symptoms associated with infections include: Localized redness, fever, chills, night sweats or profuse sweating, burning sensation when voiding, cough, congestion, stuffiness, runny nose, sore throat, diarrhea, nausea, vomiting, cold or Flu symptoms, recent or current infections. It is specially important if the infection is over the area that we intend to treat. . Heart and lung problems: Symptoms that may suggest an active cardiopulmonary problem include: cough, chest pain, breathing difficulties or shortness of breath, dizziness, ankle swelling, uncontrolled high or unusually low blood pressure, and/or palpitations. If you are experiencing any of these symptoms, cancel your procedure and contact your primary care physician for an evaluation.  Remember:  Regular Business hours are:    Monday to Thursday 8:00 AM to 4:00 PM  Provider's  Schedule: Laressa Bolinger, MD:  Procedure days: Tuesday and Thursday 7:30 AM to 4:00 PM  Bilal Lateef, MD:  Procedure days: Monday and Wednesday 7:30 AM to 4:00 PM ____________________________________________________________________________________________   ____________________________________________________________________________________________  General Risks and Possible Complications  Patient Responsibilities: It is important that you read this as it is part of your informed consent. It is our duty to inform you of the risks and possible complications associated with treatments offered to you. It is your responsibility as a patient to read this and to ask questions about anything that is not clear or that you believe was not covered in this document.  Patient's Rights: You have the right to refuse treatment. You also have the right to change your mind, even after initially having agreed to have the treatment done. However, under this last option, if you wait until the last second to change your mind, you may be charged for the materials used up to that point.  Introduction: Medicine is not an exact science. Everything in Medicine, including the lack of treatment(s), carries the potential for danger, harm, or loss (which is by definition: Risk). In Medicine, a complication is a secondary problem, condition, or disease that can aggravate an already existing one. All treatments carry the risk of possible complications. The fact that a side effects or complications occurs, does not imply that the treatment was conducted incorrectly. It must be clearly understood that these can happen even when everything is done following the highest safety standards.  No treatment: You can choose not to proceed with the proposed treatment alternative. The "PRO(s)" would include: avoiding the risk of complications associated with the therapy. The "CON(s)" would include: not getting any of the treatment  benefits. These benefits fall under one of three categories: diagnostic; therapeutic; and/or palliative. Diagnostic benefits include: getting information which can ultimately lead to improvement of the disease or symptom(s). Therapeutic benefits are those associated with the successful treatment of the disease. Finally, palliative benefits are those related to the decrease of the primary symptoms, without necessarily curing the condition (example: decreasing the pain from a flare-up of a chronic condition, such as incurable terminal cancer).  General Risks and Complications: These are associated to most interventional treatments. They can occur alone, or in combination. They fall under one of the following six (6) categories: no benefit or worsening of symptoms; bleeding; infection; nerve damage; allergic reactions; and/or death. 1. No benefits or worsening of symptoms: In Medicine there are no guarantees, only probabilities. No healthcare provider can ever guarantee that a medical treatment will work, they can only state the probability that it may. Furthermore, there is always the possibility that the condition may worsen, either directly, or indirectly, as a consequence of the treatment. 2. Bleeding: This is more common if the patient is taking a blood thinner, either prescription or over the counter (example: Goody Powders, Fish oil, Aspirin, Garlic, etc.), or if suffering a condition associated with impaired coagulation (example: Hemophilia, cirrhosis of the liver, low platelet counts, etc.). However, even if you do not have one on these, it can still happen. If you have any of these conditions, or take one of these drugs, make sure to notify your treating physician. 3. Infection: This is more common in patients with a compromised immune system, either due to disease (example: diabetes, cancer, human immunodeficiency virus [HIV], etc.), or due to medications or treatments (example: therapies used to treat  cancer and   rheumatological diseases). However, even if you do not have one on these, it can still happen. If you have any of these conditions, or take one of these drugs, make sure to notify your treating physician. 4. Nerve Damage: This is more common when the treatment is an invasive one, but it can also happen with the use of medications, such as those used in the treatment of cancer. The damage can occur to small secondary nerves, or to large primary ones, such as those in the spinal cord and brain. This damage may be temporary or permanent and it may lead to impairments that can range from temporary numbness to permanent paralysis and/or brain death. 5. Allergic Reactions: Any time a substance or material comes in contact with our body, there is the possibility of an allergic reaction. These can range from a mild skin rash (contact dermatitis) to a severe systemic reaction (anaphylactic reaction), which can result in death. 6. Death: In general, any medical intervention can result in death, most of the time due to an unforeseen complication. ____________________________________________________________________________________________   

## 2021-02-21 NOTE — Progress Notes (Signed)
PROVIDER NOTE: Information contained herein reflects review and annotations entered in association with encounter. Interpretation of such information and data should be left to medically-trained personnel. Information provided to patient can be located elsewhere in the medical record under "Patient Instructions". Document created using STT-dictation technology, any transcriptional errors that may result from process are unintentional.    Patient: Cynthia Bennett  Service Category: E/M  Provider: Gaspar Cola, MD  DOB: 13-Nov-1932  DOS: 02/21/2021  Specialty: Interventional Pain Management  MRN: [id number]  Setting: Ambulatory outpatient  PCP: [name], MD  Type: Established Patient    Referring Provider: [name], MD  Location: Office  Delivery: Face-to-face     HPI  [name], a 87 y.o. 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 [name] primary complain today is Back Pain Last encounter: My last encounter with her was on 02/06/2021. Pertinent problems: Cynthia Bennett has Fibromyalgia; Chronic pain syndrome; Lumbar radicular pain (Bilateral) (R>L); Chronic low back pain (1ry area of Pain) (Bilateral) (L>R) w/o sciatica; Grade 1 spondylolisthesis of L4 over L5; Lumbar discogenic pain syndrome; Lumbar facet syndrome (Bilateral) (L>R); Lumbar facet hypertrophy (Bilateral); Chronic sacroiliac joint pain (Left); Chronic hip pain (Left); Lumbar spondylosis; Abnormal MRI, lumbar spine (06/25/2016); Cervical spondylosis (Anterolisthesis of C5 over C6); Chronic lower extremity pain (2ry area of Pain) (Left); Chronic lumbar radicular pain (Left) (L5 Dermatome); Osteoarthritis of hip (Left); Chronic sacroiliac joint pain (Bilateral) (R>L); Chronic shoulder pain (Left); Osteoarthritis of shoulder (Left); Chronic sacroiliac joint pain (Right); Chronic hip pain (Right); Left upper arm pain; Myofascial pain syndrome (left upper extremity); Pain in left acromioclavicular  joint; Osteoarthritis of left AC (acromioclavicular) joint; Supraspinatus tendon tear, left, sequela; Spondylosis without myelopathy or radiculopathy, lumbosacral region; Other specified dorsopathies, sacral and sacrococcygeal region; Bertolotti's syndrome (Left); DDD (degenerative disc disease), lumbar; Severe (5-6 mm) L4-5 lumbar spinal stenosis and (8 mm) L3-4 spinal stenosis.; Osteoarthritis of hip (Right); Trigger point with back pain (Right); Chronic myofascial pain; Chronic low back pain (Right) w/o sciatica; Chronic hip pain (3ry area of Pain) (Bilateral) (R>L); Osteoarthritis of hips (Bilateral); and Chronic low back pain (Left) w/o sciatica on their pertinent problem list. Pain Assessment: Severity of Chronic pain is reported as a 8 /10. Location: Back  /left leg becomes numb at times. Onset: More than a month ago. Quality: Aching,Burning,Constant,Radiating. Timing: Constant. Modifying factor(s): Meds,. Vitals:  height is _0  (1.6 m) and weight is 150 lb (68 kg). Her temperature is 97 F (36.1 C) (abnormal). Her blood pressure is 119/83 and her pulse is 112 (abnormal). Her oxygen saturation is 98%.   Reason for encounter: medication management.  The patient indicates doing well with the current medication regimen. No adverse reactions or side effects reported to the medications.  The patient indicates having an increase in her low back pain.  This appears to be secondary to the patient's facet syndrome.  She seems to be having a recurrence of that.  She last had this area treated and December.  She indicates the pain is identical to what she used to have been.  The left side seems to be worse.  RTCB: 05/22/2021  Pharmacotherapy Assessment   Analgesic: Oxycodone IR 5 mg, 1 tab PO 3 times daily (maximum of 15 mg/day of oxycodone.) MME/day:  22.5 mg/day.   Monitoring: [place] PMP: PDMP not reviewed this encounter.       Pharmacotherapy: No side-effects or adverse reactions reported. Compliance:  No problems identified.  Effectiveness: Clinically acceptable.  Chauncey Fischer, RN  02/21/2021  2:39 PM  Sign when Signing Visit Nursing Pain Medication Assessment:  Safety precautions to be maintained throughout the outpatient stay will include: orient to surroundings, keep bed in low position, maintain call bell within reach at all times, provide assistance with transfer out of bed and ambulation.  Medication Inspection Compliance: Pill count conducted under aseptic conditions, in front of the patient. Neither the pills nor the bottle was removed from the patient's sight at any time. Once count was completed pills were immediately returned to the patient in their original bottle.  Medication: Oxycodone IR Pill/Patch Count: 14 of 90 pills remain Pill/Patch Appearance: Markings consistent with prescribed medication Bottle Appearance: Standard pharmacy container. Clearly labeled. Filled Date: 2 / 25 / 22 Last Medication intake:  TodaySafety precautions to be maintained throughout the outpatient stay will include: orient to surroundings, keep bed in low position, maintain call bell within reach at all times, provide assistance with transfer out of bed and ambulation.     UDS:  Summary  Date Value Ref Range Status  04/10/2020 Note  Final    Comment:    ==================================================================== [employer] (MW) ==================================================================== Test                             Result       Flag       Units Drug Present and Declared for Prescription Verification   Oxycodone                      919          EXPECTED   ng/mg creat   Oxymorphone                    57           EXPECTED   ng/mg creat   Noroxycodone                   3286         EXPECTED   ng/mg creat    Sources of oxycodone include scheduled prescription medications.    Oxymorphone and noroxycodone are expected metabolites of oxycodone.    Oxymorphone is also  available as a scheduled prescription medication. ==================================================================== Test                      Result    Flag   Units      Ref Range   Creatinine              168              mg/dL      >=20 ==================================================================== Declared Medications:  The flagging and interpretation on this report are based on the  following declared medications.  Unexpected results may arise from  inaccuracies in the declared medications.  **Note: The testing scope of this panel includes these medications:  Oxycodone (Roxicodone)  **Note: The testing scope of this panel does not include the  following reported medications:  Acetaminophen (Tylenol)  Bupropion (Wellbutrin SR)  Dorzolamide  Doxepin  Esomeprazole (Nexium)  Iron  Multivitamin  Pantoprazole (Protonix)  Sertraline (Zoloft)  Timolol  Vitamin D3  Zolpidem (Ambien) ==================================================================== For clinical consultation, please call [phone]. ====================================================================      ROS  Constitutional: Denies any fever or chills Gastrointestinal: No reported hemesis, hematochezia, vomiting,  or acute GI distress Musculoskeletal: Denies any acute onset joint swelling, redness, loss of ROM, or weakness Neurological: No reported episodes of acute onset apraxia, aphasia, dysarthria, agnosia, amnesia, paralysis, loss of coordination, or loss of consciousness  Medication Review  Vitamin D3, acetaminophen, buPROPion, cyanocobalamin, doxepin, esomeprazole, multivitamin, ondansetron, oxyCODONE, pantoprazole, and zolpidem  History Review  Allergy: Cynthia Bennett is allergic to ibuprofen. Drug: Cynthia Bennett  reports no history of drug use. Alcohol:  reports no history of alcohol use. Tobacco:  reports that she has quit smoking. She has never used smokeless tobacco. Social: Cynthia Bennett  reports  that she has quit smoking. She has never used smokeless tobacco. She reports that she does not drink alcohol and does not use drugs. Medical:  has a past medical history of Dizzy spells, Fibromyalgia, Glaucoma, Hiatal hernia, Osteoarthritis, Osteoarthritis of spine with radiculopathy, lumbar region (08/14/2015), Peptic ulcer disease, Reflux, Spinal stenosis, Stroke ([place]), and TIA (transient ischemic attack). Surgical: Cynthia Bennett  has a past surgical history that includes Esophagogastroduodenoscopy (N/A, 04/16/2015); Appendectomy; and Abdominal hysterectomy. Family: family history includes COPD in her father; Mental illness in her mother.  Laboratory Chemistry Profile   Renal Lab Results  Component Value Date   BUN 12 01/18/2021   CREATININE 1.11 (H) 01/18/2021   BCR 11 (L) 01/10/2019   GFRAA 46 (L) 01/10/2019   GFRNONAA 49 (L) 01/18/2021     Hepatic Lab Results  Component Value Date   AST 18 01/10/2019   ALT 11 (L) 06/24/2016   ALBUMIN 4.3 01/10/2019   ALKPHOS 74 01/10/2019   LIPASE 139 04/27/2014     Electrolytes Lab Results  Component Value Date   NA 135 01/18/2021   K 4.2 01/18/2021   CL 105 01/18/2021   CALCIUM 9.2 01/18/2021   MG 2.3 01/10/2019     Bone Lab Results  Component Value Date   25OHVITD1 26 (L) 01/10/2019   25OHVITD2 1.6 01/10/2019   25OHVITD3 24 01/10/2019     Inflammation (CRP: Acute Phase) (ESR: Chronic Phase) Lab Results  Component Value Date   CRP 0.9 08/24/2020   ESRSEDRATE 51 (H) 01/10/2019       Note: Above Lab results reviewed.  Recent Imaging Review  DG PAIN CLINIC C-ARM 1-60 MIN NO REPORT Fluoro was used, but no Radiologist interpretation will be provided.  Please refer to "NOTES" tab for provider progress note. Note: Reviewed        Physical Exam  General appearance: Well nourished, well developed, and well hydrated. In no apparent acute distress Mental status: Alert, oriented x 3 (person, place, & time)       Respiratory: No  evidence of acute respiratory distress Eyes: PERLA Vitals: BP 119/83   Pulse (!) 112   Temp (!) 97 F (36.1 C)   Ht _0  (1.6 m)   Wt 150 lb (68 kg)   SpO2 98%   BMI 26.57 kg/m  BMI: Estimated body mass index is 26.57 kg/m as calculated from the following:   Height as of this encounter: _1  (1.6 m).   Weight as of this encounter: 150 lb (68 kg). Ideal: Ideal body weight: 52.4 kg (115 lb 8.3 oz) Adjusted ideal body weight: 58.7 kg (129 lb 5 oz)  Assessment   Status Diagnosis  Controlled Controlled Controlled 1. Lumbar facet syndrome (Bilateral) (L>R)   2. Chronic low back pain (1ry area of Pain) (Bilateral) (L>R) w/o sciatica   3. Grade 1 spondylolisthesis of L4 over L5   4. Lumbar  facet hypertrophy (Bilateral)   5. Chronic pain syndrome   6. Pharmacologic therapy   7. Chronic use of opiate for therapeutic purpose   8. Uncomplicated opioid dependence ([place])      Updated Problems: No problems updated.  Plan of Care  Problem-specific:  No problem-specific Assessment & Plan notes found for this encounter.  Cynthia Bennett has a current medication list which includes the following long-term medication(s): bupropion, doxepin, oxycodone, [START ON 03/23/2021] oxycodone, [START ON 04/22/2021] oxycodone, pantoprazole, and zolpidem.  Pharmacotherapy (Medications Ordered): Meds ordered this encounter  Medications  . oxyCODONE (OXY IR/ROXICODONE) 5 MG immediate release tablet    Sig: Take 1 tablet (5 mg total) by mouth every 8 (eight) hours as needed for severe pain. Must last 30 days    Dispense:  90 tablet    Refill:  0    Not a duplicate. Do NOT delete! Dispense 1 day early if closed on refill date. Avoid benzodiazepines within 8 hours of opioids. Do not send refill requests.  Marland Kitchen oxyCODONE (OXY IR/ROXICODONE) 5 MG immediate release tablet    Sig: Take 1 tablet (5 mg total) by mouth every 8 (eight) hours as needed for severe pain. Must last 30 days    Dispense:  90 tablet     Refill:  0    Not a duplicate. Do NOT delete! Dispense 1 day early if closed on refill date. Avoid benzodiazepines within 8 hours of opioids. Do not send refill requests.  Marland Kitchen oxyCODONE (OXY IR/ROXICODONE) 5 MG immediate release tablet    Sig: Take 1 tablet (5 mg total) by mouth every 8 (eight) hours as needed for severe pain. Must last 30 days    Dispense:  90 tablet    Refill:  0    Not a duplicate. Do NOT delete! Dispense 1 day early if closed on refill date. Avoid benzodiazepines within 8 hours of opioids. Do not send refill requests.   Orders:  Orders Placed This Encounter  Procedures  . LUMBAR FACET(MEDIAL BRANCH NERVE BLOCK) MBNB    Standing Status:   Future    Standing Expiration Date:   03/23/2021    Scheduling Instructions:     Procedure: Lumbar facet block (AKA.: Lumbosacral medial branch nerve block)     Side: Bilateral     Level: L3-4, L4-5, & L5-S1 Facets (L2, L3, L4, L5, & S1 Medial Branch Nerves)     Sedation: Patient's choice.     Timeframe: ASAA    Order Specific Question:   Where will this procedure be performed?    Answer:   [facility]   Follow-up plan:   Return for Procedure (w/ sedation): (B) L-FCT BLK.      Interventional Therapies  Risk  Complexity Considerations:   Advanced age   Planned  Pending:   Diagnostic left L5 transverse process-sacral ala pseudoarthrosis injection.  If he ends up getting good relief of the pain, at least for the duration of the numbing medicine, she may be a good candidate for corrective surgery of the affected area.   Under consideration:   Diagnostic right lumbar facet block #1  Diagnostic right ischial bursa injection #1  Possible bilateral lumbar facet RFA Possible bilateral sacroiliac joint RFA Diagnostic left L4 TFESI  Diagnostic left IA hip joint injection  Diagnostic bilateral femoral nerve + obturator NB Possible bilateral femoral nerve + obturator nerve RFA   Completed:   Therapeutic left L4-5 LESI  x6 (04/10/2020)  Therapeutic right L4-5 LESI  x1 (07/21/2017)  Palliative right L5-S1 LESI x1 (09/27/2015)  Therapeutic left L5-S1 LESI x1 (02/05/2016)  Diagnostic right SI joint injection x2 (08/25/2019)  Diagnostic left SI joint injection x2 (01/27/2019)  Diagnostic left glenohumeral joint injection x1 (04/29/2017)  Left biceps muscle trigger point injection x1 (07/21/2017)  Right PSIS trigger point injection x1 (07/27/2018)  Diagnostic left lumbar facet block x1 (05/25/2018)  Diagnostic left IA hip injection x1 (01/27/2019)    Palliative options:   None at this time      Recent Visits No visits were found meeting these conditions. Showing recent visits within past 90 days and meeting all other requirements Today's Visits Date Type Provider Dept  02/21/21 Office Visit Milinda Pointer, MD [facility]  Showing today's visits and meeting all other requirements Future Appointments No visits were found meeting these conditions. Showing future appointments within next 90 days and meeting all other requirements  I discussed the assessment and treatment plan with the patient. The patient was provided an opportunity to ask questions and all were answered. The patient agreed with the plan and demonstrated an understanding of the instructions.  Patient advised to call back or seek an in-person evaluation if the symptoms or condition worsens.  Duration of encounter: 30 minutes.  Note by: Gaspar Cola, MD Date: 02/21/2021; Time: 3:18 PM

## 2021-03-12 ENCOUNTER — Ambulatory Visit
Admission: RE | Admit: 2021-03-12 | Discharge: 2021-03-12 | Disposition: A | Discharge status: Home / Self Care | Payer: PPO | Source: Ambulatory Visit | Attending: Pain Medicine | Admitting: Pain Medicine

## 2021-03-12 ENCOUNTER — Other Ambulatory Visit: Payer: Self-pay

## 2021-03-12 ENCOUNTER — Encounter: Payer: Self-pay | Admitting: Pain Medicine

## 2021-03-12 ENCOUNTER — Ambulatory Visit (HOSPITAL_BASED_OUTPATIENT_CLINIC_OR_DEPARTMENT_OTHER): Payer: PPO | Admitting: Pain Medicine

## 2021-03-12 VITALS — BP 130/74 | HR 98 | Temp 97.0°F | Resp 20 | Ht 63.0 in | Wt 150.0 lb

## 2021-03-12 DIAGNOSIS — M47816 Spondylosis without myelopathy or radiculopathy, lumbar region: Secondary | ICD-10-CM | POA: Diagnosis not present

## 2021-03-12 DIAGNOSIS — M4316 Spondylolisthesis, lumbar region: Secondary | ICD-10-CM

## 2021-03-12 DIAGNOSIS — G8929 Other chronic pain: Secondary | ICD-10-CM

## 2021-03-12 DIAGNOSIS — M545 Low back pain, unspecified: Secondary | ICD-10-CM | POA: Diagnosis not present

## 2021-03-12 DIAGNOSIS — M47817 Spondylosis without myelopathy or radiculopathy, lumbosacral region: Secondary | ICD-10-CM | POA: Diagnosis not present

## 2021-03-12 DIAGNOSIS — M5136 Other intervertebral disc degeneration, lumbar region: Secondary | ICD-10-CM | POA: Insufficient documentation

## 2021-03-12 MED ORDER — TRIAMCINOLONE ACETONIDE 40 MG/ML IJ SUSP
80.0000 mg | Freq: Once | INTRAMUSCULAR | Status: AC
  Administered 2021-03-12: 40 mg
  Filled 2021-03-12: qty 2

## 2021-03-12 MED ORDER — ROPIVACAINE HCL 2 MG/ML IJ SOLN
18.0000 mL | Freq: Once | INTRAMUSCULAR | Status: AC
  Administered 2021-03-12: 10 mL via PERINEURAL
  Filled 2021-03-12: qty 20

## 2021-03-12 MED ORDER — LIDOCAINE HCL 2 % IJ SOLN
20.0000 mL | Freq: Once | INTRAMUSCULAR | Status: AC
  Administered 2021-03-12: 400 mg
  Filled 2021-03-12: qty 20

## 2021-03-12 NOTE — Progress Notes (Signed)
PROVIDER NOTE: Information contained herein reflects review and annotations entered in association with encounter. Interpretation of such information and data should be left to medically-trained personnel. Information provided to patient can be located elsewhere in the medical record under "Patient Instructions". Document created using STT-dictation technology, any transcriptional errors that may result from process are unintentional.    Patient: Cynthia Bennett  Service Category: Procedure  Provider: Oswaldo Done, MD  DOB: August 07, 1933  DOS: 03/12/2021  Location: [facility]  MRN: [id number]  Setting: Ambulatory - outpatient  Referring Provider: Jerl Mina, MD  Type: Established Patient  Specialty: Interventional Pain Management  PCP: Jerl Mina, MD   Primary Reason for Visit: Interventional Pain Management Treatment. CC: Back Pain (Bilateral lumbar facet ) and Other (Left back at approx rib cage area. )  Procedure:          Anesthesia, Analgesia, Anxiolysis:  Type: Lumbar Facet, Medial Branch Block(s) #R2L2  Primary Purpose: Therapeutic Region: Posterolateral Lumbosacral Spine Level: L2, L3, L4, L5, & S1 Medial Branch Level(s). Injecting these levels blocks the L3-4, L4-5, and L5-S1 lumbar facet joints. Laterality: Bilateral  Type: Local Anesthesia Indication(s): Analgesia         Route: Infiltration ([place]/IM) IV Access: Declined Sedation: Declined  Local Anesthetic: Lidocaine 1-2%  Position: Prone   Indications: 1. Lumbar facet syndrome (Bilateral) (L>R)   2. Lumbar facet hypertrophy (Bilateral)   3. Spondylosis without myelopathy or radiculopathy, lumbosacral region   4. Grade 1 spondylolisthesis of L4 over L5   5. DDD (degenerative disc disease), lumbar   6. Chronic low back pain (Right) w/o sciatica    Pain Score: Pre-procedure: 10-Worst pain ever/10 Post-procedure: 0-No pain/10   Pre-op H&P Assessment:  Cynthia Bennett is a 87 y.o. (year old), female  patient, seen today for interventional treatment. She  has a past surgical history that includes Esophagogastroduodenoscopy (N/A, 04/16/2015); Appendectomy; and Abdominal hysterectomy. Cynthia Bennett has a current medication list which includes the following prescription(s): acetaminophen, bupropion, vitamin d3, cyanocobalamin, doxepin, esomeprazole, multivitamin, ondansetron, oxycodone, [START ON 03/23/2021] oxycodone, [START ON 04/22/2021] oxycodone, pantoprazole, sertraline, and zolpidem. Her primarily concern today is the Back Pain (Bilateral lumbar facet ) and Other (Left back at approx rib cage area. )  Initial Vital Signs:  Pulse/HCG Rate: 98ECG Heart Rate: 98 Temp: (!) 97 F (36.1 C) Resp: 16 BP: 120/60 SpO2: 99 %  BMI: Estimated body mass index is 26.57 kg/m as calculated from the following:   Height as of this encounter: 5\' 3"  (1.6 m).   Weight as of this encounter: 150 lb (68 kg).  Risk Assessment: Allergies: Reviewed. She is allergic to ibuprofen.  Allergy Precautions: None required Coagulopathies: Reviewed. None identified.  Blood-thinner therapy: None at this time Active Infection(s): Reviewed. None identified. Cynthia Bennett is afebrile  Site Confirmation: Cynthia Bennett was asked to confirm the procedure and laterality before marking the site Procedure checklist: Completed Consent: Before the procedure and under the influence of no sedative(s), amnesic(s), or anxiolytics, the patient was informed of the treatment options, risks and possible complications. To fulfill our ethical and legal obligations, as recommended by the American Medical Association's Code of Ethics, I have informed the patient of my clinical impression; the nature and purpose of the treatment or procedure; the risks, benefits, and possible complications of the intervention; the alternatives, including doing nothing; the risk(s) and benefit(s) of the alternative treatment(s) or procedure(s); and the risk(s) and benefit(s) of doing  nothing. The patient was provided information about the  general risks and possible complications associated with the procedure. These may include, but are not limited to: failure to achieve desired goals, infection, bleeding, organ or nerve damage, allergic reactions, paralysis, and death. In addition, the patient was informed of those risks and complications associated to Spine-related procedures, such as failure to decrease pain; infection (i.e.: Meningitis, epidural or intraspinal abscess); bleeding (i.e.: epidural hematoma, subarachnoid hemorrhage, or any other type of intraspinal or peri-dural bleeding); organ or nerve damage (i.e.: Any type of peripheral nerve, nerve root, or spinal cord injury) with subsequent damage to sensory, motor, and/or autonomic systems, resulting in permanent pain, numbness, and/or weakness of one or several areas of the body; allergic reactions; (i.e.: anaphylactic reaction); and/or death. Furthermore, the patient was informed of those risks and complications associated with the medications. These include, but are not limited to: allergic reactions (i.e.: anaphylactic or anaphylactoid reaction(s)); adrenal axis suppression; blood sugar elevation that in diabetics may result in ketoacidosis or comma; water retention that in patients with history of congestive heart failure may result in shortness of breath, pulmonary edema, and decompensation with resultant heart failure; weight gain; swelling or edema; medication-induced neural toxicity; particulate matter embolism and blood vessel occlusion with resultant organ, and/or nervous system infarction; and/or aseptic necrosis of one or more joints. Finally, the patient was informed that Medicine is not an exact science; therefore, there is also the possibility of unforeseen or unpredictable risks and/or possible complications that may result in a catastrophic outcome. The patient indicated having understood very clearly. We have given  the patient no guarantees and we have made no promises. Enough time was given to the patient to ask questions, all of which were answered to the patient's satisfaction. Ms. Italiano has indicated that she wanted to continue with the procedure. Attestation: I, the ordering provider, attest that I have discussed with the patient the benefits, risks, side-effects, alternatives, likelihood of achieving goals, and potential problems during recovery for the procedure that I have provided informed consent. Date  Time: 03/12/2021 11:32 AM  Pre-Procedure Preparation:  Monitoring: As per clinic protocol. Respiration, ETCO2, SpO2, BP, heart rate and rhythm monitor placed and checked for adequate function Safety Precautions: Patient was assessed for positional comfort and pressure points before starting the procedure. Time-out: I initiated and conducted the "Time-out" before starting the procedure, as per protocol. The patient was asked to participate by confirming the accuracy of the "Time Out" information. Verification of the correct person, site, and procedure were performed and confirmed by me, the nursing staff, and the patient. "Time-out" conducted as per Joint Commission's Universal Protocol (UP.01.01.01). Time: 1225  Description of Procedure:          Laterality: Bilateral. The procedure was performed in identical fashion on both sides. Levels:  L2, L3, L4, L5, & S1 Medial Branch Level(s) Area Prepped: Posterior Lumbosacral Region DuraPrep (Iodine Povacrylex [0.7% available iodine] and Isopropyl Alcohol, 74% w/w) Safety Precautions: Aspiration looking for blood return was conducted prior to all injections. At no point did we inject any substances, as a needle was being advanced. Before injecting, the patient was told to immediately notify me if she was experiencing any new onset of "ringing in the ears, or metallic taste in the mouth". No attempts were made at seeking any paresthesias. Safe injection practices  and needle disposal techniques used. Medications properly checked for expiration dates. SDV (single dose vial) medications used. After the completion of the procedure, all disposable equipment used was discarded in the proper designated medical  waste containers. Local Anesthesia: Protocol guidelines were followed. The patient was positioned over the fluoroscopy table. The area was prepped in the usual manner. The time-out was completed. The target area was identified using fluoroscopy. A 12-in long, straight, sterile hemostat was used with fluoroscopic guidance to locate the targets for each level blocked. Once located, the skin was marked with an approved surgical skin marker. Once all sites were marked, the skin (epidermis, dermis, and hypodermis), as well as deeper tissues (fat, connective tissue and muscle) were infiltrated with a small amount of a short-acting local anesthetic, loaded on a 10cc syringe with a 25G, 1.5-in  Needle. An appropriate amount of time was allowed for local anesthetics to take effect before proceeding to the next step. Local Anesthetic: Lidocaine 2.0% The unused portion of the local anesthetic was discarded in the proper designated containers. Technical explanation of process:  L2 Medial Branch Nerve Block (MBB): The target area for the L2 medial branch is at the junction of the postero-lateral aspect of the superior articular process and the superior, posterior, and medial edge of the transverse process of L3. Under fluoroscopic guidance, a Quincke needle was inserted until contact was made with os over the superior postero-lateral aspect of the pedicular shadow (target area). After negative aspiration for blood, 0.5 mL of the nerve block solution was injected without difficulty or complication. The needle was removed intact. L3 Medial Branch Nerve Block (MBB): The target area for the L3 medial branch is at the junction of the postero-lateral aspect of the superior articular  process and the superior, posterior, and medial edge of the transverse process of L4. Under fluoroscopic guidance, a Quincke needle was inserted until contact was made with os over the superior postero-lateral aspect of the pedicular shadow (target area). After negative aspiration for blood, 0.5 mL of the nerve block solution was injected without difficulty or complication. The needle was removed intact. L4 Medial Branch Nerve Block (MBB): The target area for the L4 medial branch is at the junction of the postero-lateral aspect of the superior articular process and the superior, posterior, and medial edge of the transverse process of L5. Under fluoroscopic guidance, a Quincke needle was inserted until contact was made with os over the superior postero-lateral aspect of the pedicular shadow (target area). After negative aspiration for blood, 0.5 mL of the nerve block solution was injected without difficulty or complication. The needle was removed intact. L5 Medial Branch Nerve Block (MBB): The target area for the L5 medial branch is at the junction of the postero-lateral aspect of the superior articular process and the superior, posterior, and medial edge of the sacral ala. Under fluoroscopic guidance, a Quincke needle was inserted until contact was made with os over the superior postero-lateral aspect of the pedicular shadow (target area). After negative aspiration for blood, 0.5 mL of the nerve block solution was injected without difficulty or complication. The needle was removed intact. S1 Medial Branch Nerve Block (MBB): The target area for the S1 medial branch is at the posterior and inferior 6 o'clock position of the L5-S1 facet joint. Under fluoroscopic guidance, the Quincke needle inserted for the L5 MBB was redirected until contact was made with os over the inferior and postero aspect of the sacrum, at the 6 o' clock position under the L5-S1 facet joint (Target area). After negative aspiration for blood,  0.5 mL of the nerve block solution was injected without difficulty or complication. The needle was removed intact.  Nerve block solution: 0.2%  PF-Ropivacaine + Triamcinolone (40 mg/mL) diluted to a final concentration of 4 mg of Triamcinolone/mL of Ropivacaine The unused portion of the solution was discarded in the proper designated containers. Procedural Needles: 22-gauge, 5-inch, Quincke needles used for all levels.  Once the entire procedure was completed, the treated area was cleaned, making sure to leave some of the prepping solution back to take advantage of its long term bactericidal properties.      Illustration of the posterior view of the lumbar spine and the posterior neural structures. Laminae of L2 through S1 are labeled. DPRL5, dorsal primary ramus of L5; DPRS1, dorsal primary ramus of S1; DPR3, dorsal primary ramus of L3; FJ, facet (zygapophyseal) joint L3-L4; I, inferior articular process of L4; LB1, lateral branch of dorsal primary ramus of L1; IAB, inferior articular branches from L3 medial branch (supplies L4-L5 facet joint); IBP, intermediate branch plexus; MB3, medial branch of dorsal primary ramus of L3; NR3, third lumbar nerve root; S, superior articular process of L5; SAB, superior articular branches from L4 (supplies L4-5 facet joint also); TP3, transverse process of L3.  Vitals:   03/12/21 1127 03/12/21 1226 03/12/21 1229 03/12/21 1234  BP: 120/60 (!) 153/77 130/74 130/74  Pulse: 98     Resp: Temp: (!) 97 F (36.1 C)     TempSrc: Temporal     SpO2: 99% 97% 97% 97%  Weight: 150 lb (68 kg)     Height:  (1.6 m)        Start Time: 1225 hrs. End Time: 1234 hrs.  Imaging Guidance (Spinal):          Type of Imaging Technique: Fluoroscopy Guidance (Spinal) Indication(s): Assistance in needle guidance and placement for procedures requiring needle placement in or near specific anatomical locations not easily accessible without such  assistance. Exposure Time: Please see nurses notes. Contrast: None used. Fluoroscopic Guidance: I was personally present during the use of fluoroscopy. "Tunnel Vision Technique" used to obtain the best possible view of the target area. Parallax error corrected before commencing the procedure. "Direction-depth-direction" technique used to introduce the needle under continuous pulsed fluoroscopy. Once target was reached, antero-posterior, oblique, and lateral fluoroscopic projection used confirm needle placement in all planes. Images permanently stored in EMR. Interpretation: No contrast injected. I personally interpreted the imaging intraoperatively. Adequate needle placement confirmed in multiple planes. Permanent images saved into the patient's record.  Antibiotic Prophylaxis:   Anti-infectives (From admission, onward)   None     Indication(s): None identified  Post-operative Assessment:  Post-procedure Vital Signs:  Pulse/HCG Rate: 9877 Temp: (!) 97 F (36.1 C) Resp: 20 BP: 130/74 SpO2: 97 %  EBL: None  Complications: No immediate post-treatment complications observed by team, or reported by patient.  Note: The patient tolerated the entire procedure well. A repeat set of vitals were taken after the procedure and the patient was kept under observation following institutional policy, for this type of procedure. Post-procedural neurological assessment was performed, showing return to baseline, prior to discharge. The patient was provided with post-procedure discharge instructions, including a section on how to identify potential problems. Should any problems arise concerning this procedure, the patient was given instructions to immediately contact us, at any time, without hesitation. In any case, we plan to contact the patient by telephone for a follow-up status report regarding this interventional procedure.  Comments:  No additional relevant information.  Plan of Care  Orders:   Orders Placed This Encounter  Procedures  . LUMBAR  FACET(MEDIAL BRANCH NERVE BLOCK) MBNB    Scheduling Instructions:     Procedure: Lumbar facet block (AKA.: Lumbosacral medial branch nerve block)     Side: Bilateral     Level: L3-4, L4-5, & L5-S1 Facets (L2, L3, L4, L5, & S1 Medial Branch Nerves)     Sedation: Patient's choice.     Timeframe: Today    Order Specific Question:   Where will this procedure be performed?    Answer:   [facility]  . DG PAIN [facility] C-ARM 1-60 MIN NO REPORT    Intraoperative interpretation by procedural physician at [facility].    Standing Status:   Standing    Number of Occurrences:   1    Order Specific Question:   Reason for exam:    Answer:   Assistance in needle guidance and placement for procedures requiring needle placement in or near specific anatomical locations not easily accessible without such assistance.  . Informed Consent Details: Physician/Practitioner Attestation; Transcribe to consent form and obtain patient signature    Nursing Order: Transcribe to consent form and obtain patient signature. Note: Always confirm laterality of pain with Ms. Corkery, before procedure.    Order Specific Question:   Physician/Practitioner attestation of informed consent for procedure/surgical case    Answer:   I, the physician/practitioner, attest that I have discussed with the patient the benefits, risks, side effects, alternatives, likelihood of achieving goals and potential problems during recovery for the procedure that I have provided informed consent.    Order Specific Question:   Procedure    Answer:   Lumbar Facet Block  under fluoroscopic guidance    Order Specific Question:   Physician/Practitioner performing the procedure    Answer:   Ahmon Tosi A. Laban Emperor MD    Order Specific Question:   Indication/Reason    Answer:   Low Back Pain, with our without leg pain, due to Facet Joint Arthralgia (Joint Pain) Spondylosis (Arthritis of the  Spine), without myelopathy or radiculopathy (Nerve Damage).  . Provide equipment / supplies at bedside    "Block Tray" (Disposable  single use) Needle type: SpinalSpinal Amount/quantity: 4 Size: Medium (5-inch) Gauge: 22G    Standing Status:   Standing    Number of Occurrences:   1    Order Specific Question:   Specify    Answer:   Block Tray   Chronic Opioid Analgesic:  Oxycodone IR 5 mg, 1 tab PO 3 times daily (maximum of 15 mg/day of oxycodone.) MME/day:  22.5 mg/day.   Medications ordered for procedure: Meds ordered this encounter  Medications  . lidocaine (XYLOCAINE) 2 % (with pres) injection 400 mg  . ropivacaine (PF) 2 mg/mL (0.2%) (NAROPIN) injection 18 mL  . triamcinolone acetonide (KENALOG-40) injection 80 mg   Medications administered: We administered lidocaine, ropivacaine (PF) 2 mg/mL (0.2%), and triamcinolone acetonide.  See the medical record for exact dosing, route, and time of administration.  Follow-up plan:   Return in about 2 weeks (around 03/26/2021) for (afternoon VV), on procedure day, (PPE).       Interventional Therapies  Risk  Complexity Considerations:   Advanced age   Planned  Pending:   Diagnostic left L5 transverse process-sacral ala pseudoarthrosis injection.  If he ends up getting good relief of the pain, at least for the duration of the numbing medicine, she may be a good candidate for corrective surgery of the affected area.   Under consideration:   Diagnostic right lumbar facet block #1  Diagnostic right ischial bursa injection #1  Possible bilateral lumbar facet RFA Possible bilateral sacroiliac joint RFA Diagnostic left L4 TFESI  Diagnostic left IA hip joint injection  Diagnostic bilateral femoral nerve + obturator NB Possible bilateral femoral nerve + obturator nerve RFA   Completed:   Therapeutic left L4-5 LESI x6 (04/10/2020)  Therapeutic right L4-5 LESI x1 (07/21/2017)  Palliative right L5-S1 LESI x1 (09/27/2015)   Therapeutic left L5-S1 LESI x1 (02/05/2016)  Diagnostic right SI joint injection x2 (08/25/2019)  Diagnostic left SI joint injection x2 (01/27/2019)  Diagnostic left glenohumeral joint injection x1 (04/29/2017)  Left biceps muscle trigger point injection x1 (07/21/2017)  Right PSIS trigger point injection x1 (07/27/2018)  Diagnostic right lumbar facet block x1 (10/23/2020)  Diagnostic left lumbar facet block x2 (10/23/2020)  Diagnostic left IA hip injection x1 (01/27/2019)    Palliative options:   None at this time    Recent Visits Date Type Provider Dept  02/21/21 Office Visit Delano MetzNaveira, Traves Majchrzak, MD [facility]  Showing recent visits within past 90 days and meeting all other requirements Today's Visits Date Type Provider Dept  03/12/21 Procedure visit Delano MetzNaveira, Rashan Rounsaville, MD [facility]  Showing today's visits and meeting all other requirements Future Appointments Date Type Provider Dept  03/28/21 Appointment Delano MetzNaveira, Laval Cafaro, MD [facility]  Showing future appointments within next 90 days and meeting all other requirements  Disposition: Discharge home  Discharge (Date  Time): 03/12/2021; 1245 hrs.   Primary Care Physician: Jerl MinaHedrick, James, MD [name] Note by: Oswaldo DoneFrancisco A Nikolaj Geraghty, MD Date: 03/12/2021; Time: 3:56 PM  Disclaimer:  Medicine is not an Visual merchandiserexact science. The only guarantee in medicine is that nothing is guaranteed. It is important to note that the decision to proceed with this intervention was based on the information collected from the patient. The Data and conclusions were drawn from the patient's questionnaire, the interview, and the physical examination. Because the information was provided in large part by the patient, it cannot be guaranteed that it has not been purposely or unconsciously manipulated. Every effort has been made to obtain as much relevant data as possible for this evaluation. It is  important to note that the conclusions that lead to this procedure are derived in large part from the available data. Always take into account that the treatment will also be dependent on availability of resources and existing treatment guidelines, considered by other Pain Management Practitioners as being common knowledge and practice, at the time of the intervention. For Medico-Legal purposes, it is also important to point out that variation in procedural techniques and pharmacological choices are the acceptable norm. The indications, contraindications, technique, and results of the above procedure should only be interpreted and judged by a Board-Certified Interventional Pain Specialist with extensive familiarity and expertise in the same exact procedure and technique.

## 2021-03-12 NOTE — Progress Notes (Signed)
Safety precautions to be maintained throughout the outpatient stay will include: orient to surroundings, keep bed in low position, maintain call bell within reach at all times, provide assistance with transfer out of bed and ambulation.  

## 2021-03-12 NOTE — Patient Instructions (Addendum)
____________________________________________________________________________________________  Post-Procedure Discharge Instructions  Instructions: Apply ice:  Purpose: This will minimize any swelling and discomfort after procedure.  When: Day of procedure, as soon as you get home. How: Fill a plastic sandwich bag with crushed ice. Cover it with a small towel and apply to injection site. How long: (15 min on, 15 min off) Apply for 15 minutes then remove x 15 minutes.  Repeat sequence on day of procedure, until you go to bed. Apply heat:  Purpose: To treat any soreness and discomfort from the procedure. When: Starting the next day after the procedure. How: Apply heat to procedure site starting the day following the procedure. How long: May continue to repeat daily, until discomfort goes away. Food intake: Start with clear liquids (like water) and advance to regular food, as tolerated.  Physical activities: Keep activities to a minimum for the first 8 hours after the procedure. After that, then as tolerated. Driving: If you have received any sedation, be responsible and do not drive. You are not allowed to drive for 24 hours after having sedation. Blood thinner: (Applies only to those taking blood thinners) You may restart your blood thinner 6 hours after your procedure. Insulin: (Applies only to Diabetic patients taking insulin) As soon as you can eat, you may resume your normal dosing schedule. Infection prevention: Keep procedure site clean and dry. Shower daily and clean area with soap and water. Post-procedure Pain Diary: Extremely important that this be done correctly and accurately. Recorded information will be used to determine the next step in treatment. For the purpose of accuracy, follow these rules: Evaluate only the area treated. Do not report or include pain from an untreated area. For the purpose of this evaluation, ignore all other areas of pain, except for the treated area. After  your procedure, avoid taking a long nap and attempting to complete the pain diary after you wake up. Instead, set your alarm clock to go off every hour, on the hour, for the initial 8 hours after the procedure. Document the duration of the numbing medicine, and the relief you are getting from it. Do not go to sleep and attempt to complete it later. It will not be accurate. If you received sedation, it is likely that you were given a medication that may cause amnesia. Because of this, completing the diary at a later time may cause the information to be inaccurate. This information is needed to plan your care. Follow-up appointment: Keep your post-procedure follow-up evaluation appointment after the procedure (usually 2 weeks for most procedures, 6 weeks for radiofrequencies). DO NOT FORGET to bring you pain diary with you.   Expect: (What should I expect to see with my procedure?) From numbing medicine (AKA: Local Anesthetics): Numbness or decrease in pain. You may also experience some weakness, which if present, could last for the duration of the local anesthetic. Onset: Full effect within 15 minutes of injected. Duration: It will depend on the type of local anesthetic used. On the average, 1 to 8 hours.  From steroids (Applies only if steroids were used): Decrease in swelling or inflammation. Once inflammation is improved, relief of the pain will follow. Onset of benefits: Depends on the amount of swelling present. The more swelling, the longer it will take for the benefits to be seen. In some cases, up to 10 days. Duration: Steroids will stay in the system x 2 weeks. Duration of benefits will depend on multiple posibilities including persistent irritating factors. Side-effects: If present, they   may typically last 2 weeks (the duration of the steroids). Frequent: Cramps (if they occur, drink Gatorade and take over-the-counter Magnesium 450-500 mg once to twice a day); water retention with temporary  weight gain; increases in blood sugar; decreased immune system response; increased appetite. Occasional: Facial flushing (red, warm cheeks); mood swings; menstrual changes. Uncommon: Long-term decrease or suppression of natural hormones; bone thinning. (These are more common with higher doses or more frequent use. This is why we prefer that our patients avoid having any injection therapies in other practices.)  Very Rare: Severe mood changes; psychosis; aseptic necrosis. From procedure: Some discomfort is to be expected once the numbing medicine wears off. This should be minimal if ice and heat are applied as instructed.  Call if: (When should I call?) You experience numbness and weakness that gets worse with time, as opposed to wearing off. New onset bowel or bladder incontinence. (Applies only to procedures done in the spine)  Emergency Numbers: Durning business hours (Monday - Thursday, 8:00 AM - 4:00 PM) (Friday, 9:00 AM - 12:00 Noon): [phone] After hours: [phone] NOTE: If you are having a problem and are unable connect with, or to talk to a provider, then go to your nearest urgent care or emergency department. If the problem is serious and urgent, please call 911. ____________________________________________________________________________________________ ____________________________________________________________________________________________  Virtual Visits   What is a "Virtual Visit"? It is a healthcare communication encounter (medical visit) that takes place on real time (NOT TEXT or E-MAIL) over the telephone or computer device (desktop, laptop, tablet, smart phone, etc.). It allows for more location flexibility between the patient and the healthcare provider.  Who decides when these types of visits will be used? The physician.  Who is eligible for these types of visits? Only those patients that can be reliably reached over the telephone.  What do you mean by  reliably? We do not have time to call everyone multiple times, therefore those that tend to screen calls and then call back later are not suitable candidates for this system. We understand how people are reluctant to pickup on "unknown" calls, therefore, we suggest adding our telephone numbers to your list of "CONTACT(s)". This way, you should be able to readily identify our calls when you receive one. All of our numbers are available below.   Who is not eligible? This option is not available for medication management encounters, specially for controlled substances. Patients on pain medications that fall under the category of controlled substances have to come in for "Face-to-Face" encounters. This is required for mandatory monitoring of these substances. You may be asked to provide a sample for an unannounced urine drug screening test (UDS), and we will need to count your pain pills. Not bringing your pills to be counted may result in no refill. Obviously, neither one of these can be done over the phone.  When will this type of visits be used? You can request a virtual visit whenever you are physically unable to attend a regular appointment. The decision will be made by the physician (or healthcare provider) on a case by case basis.   At what time will I be called? This is an excellent question. The providers will try to call you whenever they have time available. Do not expect to be called at any specific time. The secretaries will assign you a time for your virtual visit appointment, but this is done simply to keep a list of those patients that need to be called, but   not for the purpose of keeping a time schedule. Be advised that the call may come in anytime during the day, between the hours of 8:00 AM and 8::00 PM, depending on provider availability. We do understand that the system is not perfect. If you are unable to be available that day on a moments notice, then request an "in-person" appointment  rather than a "virtual visit".  Can I request my medication visits to be "Virtual"? Yes you may request it, but the decision is entirely up to the healthcare provider. Control substances require specific monitoring that requires Face-to-Face encounters. The number of encounters  and the extent of the monitoring is determined on a case by case basis.  Add a new contact to your smart phone and label it "PAIN CLINIC" Under this contact add the following numbers: Main: [phone] (Official Contact Number) Nurses: [phone] (These are outgoing only calling systems. Do not call this number.) Dr. Anquinette Pierro: [phone] or [phone] (Outgoing calls only. Do not call this number.)  ____________________________________________________________________________________________    

## 2021-03-13 ENCOUNTER — Telehealth: Payer: Self-pay

## 2021-03-13 NOTE — Telephone Encounter (Signed)
Post procedure phone call.  LM 

## 2021-03-27 NOTE — Progress Notes (Signed)
Patient: Cynthia Bennett  Service Category: E/M  Provider: Gaspar Cola, MD  DOB: [birth year]  DOS: 03/28/2021  Location: Office  MRN: [id number]  Setting: Ambulatory outpatient  Referring Provider: [name], MD  Type: Established Patient  Specialty: Interventional Pain Management  PCP: [name], MD  Location: Remote location  Delivery: TeleHealth     Virtual Encounter - Pain Management PROVIDER NOTE: Information contained herein reflects review and annotations entered in association with encounter. Interpretation of such information and data should be left to medically-trained personnel. Information provided to patient can be located elsewhere in the medical record under "Patient Instructions". Document created using STT-dictation technology, any transcriptional errors that may result from process are unintentional.    Contact & Pharmacy Preferred: [place]: [phone] (home) Mobile: There is no such number on file (mobile). E-mail: [name]  [place], [place] [address] Phone: [phone] Fax: [phone]   Pre-screening  Cynthia Bennett offered "in-person" vs "virtual" encounter. She indicated preferring virtual for this encounter.   Reason COVID-19*  Social distancing based on CDC and AMA recommendations.   I contacted Cynthia Bennett on 03/28/2021 via telephone.      I clearly identified myself as Gaspar Cola, MD. I verified that I was speaking with the correct person using two identifiers (Name: Cynthia Bennett, and date of birth: August 04, 1933).  Consent I sought verbal advanced consent from Cynthia Bennett for virtual visit interactions. I informed Cynthia Bennett of possible security and privacy concerns, risks, and limitations associated with providing "not-in-person" medical evaluation and management services. I also informed Cynthia Bennett of the availability of "in-person" appointments. Finally, I informed her that  there would be a charge for the virtual visit and that she could be  personally, fully or partially, financially responsible for it. Cynthia Bennett expressed understanding and agreed to proceed.   Historic Elements   Cynthia Bennett is a 87 y.o. year old, female patient evaluated today after our last contact on 03/12/2021. Cynthia Bennett  has a past medical history of Dizzy spells, Fibromyalgia, Glaucoma, Hiatal hernia, Osteoarthritis, Osteoarthritis of spine with radiculopathy, lumbar region (08/14/2015), Peptic ulcer disease, Reflux, Spinal stenosis, Stroke ([place]), and TIA (transient ischemic attack). She also  has a past surgical history that includes Esophagogastroduodenoscopy (N/A, 04/16/2015); Appendectomy; and Abdominal hysterectomy. Cynthia Bennett has a current medication list which includes the following prescription(s): acetaminophen, bupropion, vitamin d3, cyanocobalamin, doxepin, esomeprazole, multivitamin, ondansetron, oxycodone, [START ON 04/22/2021] oxycodone, pantoprazole, sertraline, zolpidem, and oxycodone. She  reports that she has quit smoking. She has never used smokeless tobacco. She reports that she does not drink alcohol and does not use drugs. Cynthia Bennett is allergic to ibuprofen.   HPI  Today, she is being contacted for a post-procedure assessment.  The patient refers doing well with the bilateral lumbar facet block and she refers that all of her pain seems to be gone except for pain that she has in the left thoracic rib cage region which she refers worsen significantly if she stands for any prolonged period of time.  I was reviewing the chart and there is nothing about any x-rays of that region and therefore I will be ordering some.  At her age I am concerned about the possibility of compression fractures.  Today I have reminded the patient that we sent 3 prescriptions to the [facility] in [place] in [place].  Post-Procedure Evaluation  Procedure (03/12/2021):  Diagnostic bilateral lumbar facet MBB #2 under  fluoroscopic guidance, no sedation Pre-procedure pain level: 10/10 Post-procedure: 0/10 (100% relief)  Sedation: None.  Effectiveness during initial hour after procedure(Ultra-Short Term Relief): 100 %.  Local anesthetic used: Long-acting (4-6 hours) Effectiveness: Defined as any analgesic benefit obtained secondary to the administration of local anesthetics. This carries significant diagnostic value as to the etiological location, or anatomical origin, of the pain. Duration of benefit is expected to coincide with the duration of the local anesthetic used.  Effectiveness during initial 4-6 hours after procedure(Short-Term Relief): 100 %.  Long-term benefit: Defined as any relief past the pharmacologic duration of the local anesthetics.  Effectiveness past the initial 6 hours after procedure(Long-Term Relief): 75 % (ongoing).  Current benefits: Defined as benefit that persist at this time.   Analgesia:  The patient describes an ongoing 75% relief of her pain Function: Cynthia Bennett reports improvement in function ROM: Cynthia Bennett reports improvement in ROM  Pharmacotherapy Assessment  Analgesic: Oxycodone IR 5 mg, 1 tab PO 3 times daily (maximum of 15 mg/day of oxycodone.) MME/day:  22.5 mg/day.   Monitoring: [place] PMP: PDMP reviewed during this encounter.       Pharmacotherapy: No side-effects or adverse reactions reported. Compliance: No problems identified. Effectiveness: Clinically acceptable. Plan: Refer to "POC".  UDS:  Summary  Date Value Ref Range Status  04/10/2020 Note  Final    Comment:    ==================================================================== [employer] Select 13 (MW) ==================================================================== Test                             Result       Flag       Units Drug Present and Declared for Prescription Verification   Oxycodone                      919          EXPECTED   ng/mg creat   Oxymorphone                    57            EXPECTED   ng/mg creat   Noroxycodone                   3286         EXPECTED   ng/mg creat    Sources of oxycodone include scheduled prescription medications.    Oxymorphone and noroxycodone are expected metabolites of oxycodone.    Oxymorphone is also available as a scheduled prescription medication. ==================================================================== Test                      Result    Flag   Units      Ref Range   Creatinine              168              mg/dL      >=20 ==================================================================== Declared Medications:  The flagging and interpretation on this report are based on the  following declared medications.  Unexpected results may arise from  inaccuracies in the declared medications.  **Note: The testing scope of this panel includes these medications:  Oxycodone (Roxicodone)  **Note: The testing scope of this panel does not include the  following reported medications:  Acetaminophen (Tylenol)  Bupropion (Wellbutrin SR)  Dorzolamide  Doxepin  Esomeprazole (Nexium)  Iron  Multivitamin  Pantoprazole (Protonix)  Sertraline (Zoloft)  Timolol  Vitamin D3  Zolpidem (Ambien) ==================================================================== For clinical consultation, please call [phone]. ====================================================================     Laboratory Chemistry Profile   Renal Lab Results  Component Value Date   BUN 12 01/18/2021   CREATININE 1.11 (H) 01/18/2021   BCR 11 (L) 01/10/2019   GFRAA 46 (L) 01/10/2019   GFRNONAA 49 (L) 01/18/2021     Hepatic Lab Results  Component Value Date   AST 18 01/10/2019   ALT 11 (L) 06/24/2016   ALBUMIN 4.3 01/10/2019   ALKPHOS 74 01/10/2019   LIPASE 139 04/27/2014     Electrolytes Lab Results  Component Value Date   NA 135 01/18/2021   K 4.2 01/18/2021   CL 105 01/18/2021   CALCIUM 9.2 01/18/2021   MG 2.3 01/10/2019      Bone Lab Results  Component Value Date   25OHVITD1 26 (L) 01/10/2019   25OHVITD2 1.6 01/10/2019   25OHVITD3 24 01/10/2019     Inflammation (CRP: Acute Phase) (ESR: Chronic Phase) Lab Results  Component Value Date   CRP 0.9 08/24/2020   ESRSEDRATE 51 (H) 01/10/2019       Note: Above Lab results reviewed.  Imaging  DG PAIN CLINIC C-ARM 1-60 MIN NO REPORT Fluoro was used, but no Radiologist interpretation will be provided.  Please refer to "NOTES" tab for provider progress note.  Assessment  The primary encounter diagnosis was Left-sided thoracic back pain, unspecified chronicity. Diagnoses of Lumbar facet syndrome (Bilateral) (L>R) and Chronic low back pain (1ry area of Pain) (Bilateral) (L>R) w/o sciatica were also pertinent to this visit.  Plan of Care  Problem-specific:  No problem-specific Assessment & Plan notes found for this encounter.  Cynthia Bennett has a current medication list which includes the following long-term medication(s): bupropion, doxepin, oxycodone, oxycodone, [START ON 04/22/2021] oxycodone, pantoprazole, and zolpidem.  Pharmacotherapy (Medications Ordered): No orders of the defined types were placed in this encounter.  Orders:  Orders Placed This Encounter  Procedures  . DG Thoracic Spine 2 View    Patient presents with axial pain with possible radicular component.  In addition to any acute findings, please report on:  1. Facet (Zygapophyseal) joint DJD (Hypertrophy, space narrowing, subchondral sclerosis, and/or osteophyte formation) 2. DDD and/or IVDD (Loss of disc height, desiccation or "Black disc disease") 3. Pars defects 4. Spondylolisthesis, spondylosis, and/or spondyloarthropathies (include Degree/Grade of displacement in mm) 5. Vertebral body Fractures, including age (old, new/acute) 88. Modic Type Changes 7. Demineralization 8. Bone pathology 9. Central, Lateral Recess, and/or Foraminal Stenosis (include AP diameter of stenosis in  mm) 10. Surgical changes (hardware type, status, and presence of fibrosis) NOTE: Please specify level(s) and laterality.    Standing Status:   Future    Standing Expiration Date:   04/28/2021    Scheduling Instructions:     Imaging must be done as soon as possible. Inform patient that order will expire within 30 days and I will not renew it.    Order Specific Question:   Reason for Exam (SYMPTOM  OR DIAGNOSIS REQUIRED)    Answer:   Upper back pain and/or thoracic spine pain.    Order Specific Question:   Preferred imaging location?    Answer:   [facility]    Order Specific Question:   Call Results- Best Contact Number?    Answer:   [phone] [facility])   Follow-up plan:   Return for evaluation day (afternoon VV), to discuss  imaging results.      Interventional Therapies  Risk  Complexity Considerations:   Advanced age   Planned  Pending:   Diagnostic left L5 transverse process-sacral ala pseudoarthrosis injection.  If he ends up getting good relief of the pain, at least for the duration of the numbing medicine, she may be a good candidate for corrective surgery of the affected area.   Under consideration:   Diagnostic right lumbar facet block #1  Diagnostic right ischial bursa injection #1  Possible bilateral lumbar facet RFA Possible bilateral sacroiliac joint RFA Diagnostic left L4 TFESI  Diagnostic left IA hip joint injection  Diagnostic bilateral femoral nerve + obturator NB Possible bilateral femoral nerve + obturator nerve RFA   Completed:   Therapeutic left L4-5 LESI x6 (04/10/2020)  Therapeutic right L4-5 LESI x1 (07/21/2017)  Palliative right L5-S1 LESI x1 (09/27/2015)  Therapeutic left L5-S1 LESI x1 (02/05/2016)  Diagnostic right SI joint injection x2 (08/25/2019)  Diagnostic left SI joint injection x2 (01/27/2019)  Diagnostic left glenohumeral joint injection x1 (04/29/2017)  Left biceps muscle trigger point injection x1 (07/21/2017)  Right  PSIS trigger point injection x1 (07/27/2018)  Diagnostic right lumbar facet block x1 (10/23/2020)  Diagnostic left lumbar facet block x2 (10/23/2020)  Diagnostic left IA hip injection x1 (01/27/2019)    Palliative options:   None at this time     Recent Visits Date Type Provider Dept  03/12/21 Procedure visit Milinda Pointer, MD [facility]  02/21/21 Office Visit Milinda Pointer, MD [facility]  Showing recent visits within past 90 days and meeting all other requirements Today's Visits Date Type Provider Dept  03/28/21 Telemedicine Milinda Pointer, MD [facility]  Showing today's visits and meeting all other requirements Future Appointments No visits were found meeting these conditions. Showing future appointments within next 90 days and meeting all other requirements  I discussed the assessment and treatment plan with the patient. The patient was provided an opportunity to ask questions and all were answered. The patient agreed with the plan and demonstrated an understanding of the instructions.  Patient advised to call back or seek an in-person evaluation if the symptoms or condition worsens.  Duration of encounter: 15 minutes.  Note by: Gaspar Cola, MD Date: 03/28/2021; Time: 4:12 PM

## 2021-03-28 ENCOUNTER — Encounter: Payer: Self-pay | Admitting: Pain Medicine

## 2021-03-28 ENCOUNTER — Other Ambulatory Visit: Payer: Self-pay

## 2021-03-28 ENCOUNTER — Ambulatory Visit: Payer: PPO | Attending: Pain Medicine | Admitting: Pain Medicine

## 2021-03-28 DIAGNOSIS — G8929 Other chronic pain: Secondary | ICD-10-CM | POA: Diagnosis not present

## 2021-03-28 DIAGNOSIS — M545 Low back pain, unspecified: Secondary | ICD-10-CM

## 2021-03-28 DIAGNOSIS — M47816 Spondylosis without myelopathy or radiculopathy, lumbar region: Secondary | ICD-10-CM | POA: Diagnosis not present

## 2021-03-28 DIAGNOSIS — M546 Pain in thoracic spine: Secondary | ICD-10-CM

## 2021-05-02 ENCOUNTER — Telehealth: Payer: Self-pay | Admitting: Pain Medicine

## 2021-05-02 ENCOUNTER — Ambulatory Visit
Admission: RE | Admit: 2021-05-02 | Discharge: 2021-05-02 | Disposition: A | Discharge status: Home / Self Care | Payer: PPO | Source: Ambulatory Visit | Attending: Student in an Organized Health Care Education/Training Program | Admitting: Student in an Organized Health Care Education/Training Program

## 2021-05-02 ENCOUNTER — Other Ambulatory Visit: Payer: Self-pay | Admitting: Student in an Organized Health Care Education/Training Program

## 2021-05-02 DIAGNOSIS — M546 Pain in thoracic spine: Secondary | ICD-10-CM | POA: Diagnosis not present

## 2021-05-02 NOTE — Telephone Encounter (Signed)
Patient is at [facility] to have xrays done but the order from 03-28-21 is expired. Can please ask Dr. Cherylann Ratel if he will write new order to replace?

## 2021-05-02 NOTE — Telephone Encounter (Signed)
Reviewed Dr Harley Alto note and she was ordered  [facility] Thoracic Spine 2 views.    Can you reorder this. Patient is here to have it done and order has expired.  Thanks

## 2021-05-20 ENCOUNTER — Encounter: Payer: Self-pay | Admitting: Internal Medicine

## 2021-05-20 ENCOUNTER — Inpatient Hospital Stay: Payer: PPO | Attending: Internal Medicine

## 2021-05-20 ENCOUNTER — Inpatient Hospital Stay: Payer: PPO | Admitting: Internal Medicine

## 2021-05-20 ENCOUNTER — Inpatient Hospital Stay: Payer: PPO

## 2021-05-20 ENCOUNTER — Other Ambulatory Visit: Payer: Self-pay

## 2021-05-20 VITALS — BP 124/79 | HR 74 | Temp 96.7°F | Resp 18

## 2021-05-20 DIAGNOSIS — Z818 Family history of other mental and behavioral disorders: Secondary | ICD-10-CM | POA: Diagnosis not present

## 2021-05-20 DIAGNOSIS — I700 Atherosclerosis of aorta: Secondary | ICD-10-CM | POA: Diagnosis not present

## 2021-05-20 DIAGNOSIS — M2550 Pain in unspecified joint: Secondary | ICD-10-CM | POA: Insufficient documentation

## 2021-05-20 DIAGNOSIS — Z79899 Other long term (current) drug therapy: Secondary | ICD-10-CM | POA: Diagnosis not present

## 2021-05-20 DIAGNOSIS — D631 Anemia in chronic kidney disease: Secondary | ICD-10-CM

## 2021-05-20 DIAGNOSIS — Z886 Allergy status to analgesic agent status: Secondary | ICD-10-CM | POA: Diagnosis not present

## 2021-05-20 DIAGNOSIS — Z8673 Personal history of transient ischemic attack (TIA), and cerebral infarction without residual deficits: Secondary | ICD-10-CM | POA: Insufficient documentation

## 2021-05-20 DIAGNOSIS — Z9049 Acquired absence of other specified parts of digestive tract: Secondary | ICD-10-CM | POA: Diagnosis not present

## 2021-05-20 DIAGNOSIS — N1832 Chronic kidney disease, stage 3b: Secondary | ICD-10-CM

## 2021-05-20 DIAGNOSIS — E611 Iron deficiency: Secondary | ICD-10-CM

## 2021-05-20 DIAGNOSIS — M47814 Spondylosis without myelopathy or radiculopathy, thoracic region: Secondary | ICD-10-CM | POA: Diagnosis not present

## 2021-05-20 DIAGNOSIS — Z836 Family history of other diseases of the respiratory system: Secondary | ICD-10-CM | POA: Insufficient documentation

## 2021-05-20 DIAGNOSIS — K449 Diaphragmatic hernia without obstruction or gangrene: Secondary | ICD-10-CM | POA: Insufficient documentation

## 2021-05-20 DIAGNOSIS — Z87891 Personal history of nicotine dependence: Secondary | ICD-10-CM | POA: Insufficient documentation

## 2021-05-20 DIAGNOSIS — M549 Dorsalgia, unspecified: Secondary | ICD-10-CM | POA: Diagnosis not present

## 2021-05-20 DIAGNOSIS — R5383 Other fatigue: Secondary | ICD-10-CM | POA: Insufficient documentation

## 2021-05-20 DIAGNOSIS — G8929 Other chronic pain: Secondary | ICD-10-CM | POA: Diagnosis not present

## 2021-05-20 LAB — CBC WITH DIFFERENTIAL/PLATELET
Abs Immature Granulocytes: 0.03 10*3/uL (ref 0.00–0.07)
Basophils Absolute: 0.1 10*3/uL (ref 0.0–0.1)
Basophils Relative: 1 %
Eosinophils Absolute: 0.3 10*3/uL (ref 0.0–0.5)
Eosinophils Relative: 4 %
HCT: 40.5 % (ref 36.0–46.0)
Hemoglobin: 13.1 g/dL (ref 12.0–15.0)
Immature Granulocytes: 0 %
Lymphocytes Relative: 24 %
Lymphs Abs: 2.3 10*3/uL (ref 0.7–4.0)
MCH: 27.9 pg (ref 26.0–34.0)
MCHC: 32.3 g/dL (ref 30.0–36.0)
MCV: 86.4 fL (ref 80.0–100.0)
Monocytes Absolute: 0.9 10*3/uL (ref 0.1–1.0)
Monocytes Relative: 9 %
Neutro Abs: 5.7 10*3/uL (ref 1.7–7.7)
Neutrophils Relative %: 62 %
Platelets: 356 10*3/uL (ref 150–400)
RBC: 4.69 MIL/uL (ref 3.87–5.11)
RDW: 13.5 % (ref 11.5–15.5)
WBC: 9.2 10*3/uL (ref 4.0–10.5)
nRBC: 0 % (ref 0.0–0.2)

## 2021-05-20 LAB — BASIC METABOLIC PANEL
Anion gap: 7 (ref 5–15)
BUN: 14 mg/dL (ref 8–23)
CO2: 24 mmol/L (ref 22–32)
Calcium: 9.2 mg/dL (ref 8.9–10.3)
Chloride: 104 mmol/L (ref 98–111)
Creatinine, Ser: 1.03 mg/dL — ABNORMAL HIGH (ref 0.44–1.00)
GFR, Estimated: 53 mL/min — ABNORMAL LOW (ref >60)
Glucose, Bld: 83 mg/dL (ref 70–99)
Potassium: 4.2 mmol/L (ref 3.5–5.1)
Sodium: 135 mmol/L (ref 135–145)

## 2021-05-20 MED ORDER — SODIUM CHLORIDE 0.9 % IV SOLN
Freq: Once | INTRAVENOUS | Status: AC
  Filled 2021-05-20: qty 250

## 2021-05-20 MED ORDER — SODIUM CHLORIDE 0.9 % IV SOLN: 200.0000 mg | Freq: Once | INTRAVENOUS | Status: DC

## 2021-05-20 MED ORDER — IRON SUCROSE 20 MG/ML IV SOLN
200.0000 mg | Freq: Once | INTRAVENOUS | Status: AC
Start: 2021-05-20 — End: 2021-05-20
  Administered 2021-05-20: 200 mg via INTRAVENOUS
  Filled 2021-05-20: qty 10

## 2021-05-20 NOTE — Assessment & Plan Note (Addendum)
#   Anemia iron def/CKD stageIII : Hb-8-9 [MCV- 77] s/p IV iron infusion hemoglobin up to 13 today. Proceed with Venofer today. PO iron significant GI discomfort [even with PO every otherday]  # CKDstage III-stable.  # Chronic back pain/? scaitica [Dr.Neivera]-stable.  # DISPOSITION: # Venofer today # follow up in 4 months- MD; labs- cbc/bmp- possible IVvenofer- Dr.B  Cc; Dr.Hedrick

## 2021-05-20 NOTE — Progress Notes (Signed)
Pt tolerated venofer infusion well today with no problems or complaints.  Pt left infusion suite stable and ambulatory in a wheelchair.

## 2021-05-20 NOTE — Progress Notes (Signed)
[facility] CONSULT NOTE  Patient Care Team: Jerl Mina, MD as PCP - General (Family Medicine) Earna Coder, MD as Consulting Physician (Hematology and Oncology)  CHIEF COMPLAINTS/PURPOSE OF CONSULTATION: Anemia   HEMATOLOGY HISTORY  # CHRONIC NORMOCYTIC ANEMIA [since 2019-PCP- Hb 8-9; Iron sat- 5%;NO Ferritin; GFR- 43]EGD/colonoscopy-?2013-2104- pt declines procedures;  IV Venofer-  #CKD stage III  HISTORY OF PRESENTING ILLNESS:  Cynthia Bennett 87 y.o.  female history of anemia secondary ?chronic kidney disease is here for follow-up.  Patient denies any worsening abdominal pain nausea vomiting.  No blood in stools black or stools.  Patient also improvement of energy levels post IV iron deficient.  Intolerance to p.o. iron. Review of Systems  Constitutional:  Positive for malaise/fatigue. Negative for chills, diaphoresis and fever.  HENT:  Negative for nosebleeds and sore throat.   Eyes:  Negative for double vision.  Respiratory:  Negative for cough, hemoptysis, sputum production, shortness of breath and wheezing.   Cardiovascular:  Negative for chest pain, palpitations, orthopnea and leg swelling.  Gastrointestinal:  Negative for abdominal pain, blood in stool, constipation, diarrhea, heartburn, melena, nausea and vomiting.  Genitourinary:  Negative for dysuria, frequency and urgency.  Musculoskeletal:  Positive for back pain and joint pain.  Skin: Negative.  Negative for itching and rash.  Neurological:  Negative for dizziness, tingling, focal weakness, weakness and headaches.  Endo/Heme/Allergies:  Does not bruise/bleed easily.  Psychiatric/Behavioral:  Negative for depression. The patient is not nervous/anxious and does not have insomnia.    MEDICAL HISTORY:  Past Medical History:  Diagnosis Date  . Dizzy spells   . Fibromyalgia   . Glaucoma   . Hiatal hernia   . Osteoarthritis   . Osteoarthritis of spine with radiculopathy, lumbar region  08/14/2015  . Peptic ulcer disease   . Reflux   . Spinal stenosis   . Stroke (HCC)   . TIA (transient ischemic attack)     SURGICAL HISTORY: Past Surgical History:  Procedure Laterality Date  . ABDOMINAL HYSTERECTOMY    . APPENDECTOMY    . ESOPHAGOGASTRODUODENOSCOPY N/A 04/16/2015   Procedure: ESOPHAGOGASTRODUODENOSCOPY (EGD);  Surgeon: Wallace Cullens, MD;  Location: [facility] ENDOSCOPY;  Service: Gastroenterology;  Laterality: N/A;    SOCIAL HISTORY: Social History   Socioeconomic History  . Marital status: Widowed    Spouse name: Not on file  . Number of children: Not on file  . Years of education: Not on file  . Highest education level: Not on file  Occupational History  . Not on file  Tobacco Use  . Smoking status: Former  . Smokeless tobacco: Never  Substance and Sexual Activity  . Alcohol use: No    Alcohol/week: 0.0 standard drinks  . Drug use: No  . Sexual activity: Not on file  Other Topics Concern  . Not on file  Social History Narrative   Pt lives in [place]; with son. Quit smoking in 1957. No alcohol. Designed clothes/ alterations.    Social Determinants of Health   Financial Resource Strain: Not on file  Food Insecurity: Not on file  Transportation Needs: Not on file  Physical Activity: Not on file  Stress: Not on file  Social Connections: Not on file  Intimate Partner Violence: Not on file    FAMILY HISTORY: Family History  Problem Relation Age of Onset  . Mental illness Mother   . COPD Father     ALLERGIES:  is allergic to ibuprofen.  MEDICATIONS:  Current Outpatient Medications  Medication Sig Dispense Refill  . acetaminophen (TYLENOL) 325 MG tablet Take 650 mg by mouth every 6 (six) hours as needed.    Marland Kitchen buPROPion (WELLBUTRIN SR) 150 MG 12 hr tablet TAKE 1 TABLET BY MOUTH ONCE DAILY    . Cholecalciferol (VITAMIN D3) 20 MCG (800 UNIT) TABS Take by mouth 2 (two) times daily.    . cyanocobalamin (,VITAMIN B-12,) 1000 MCG/ML injection Inject 1,000 mcg  into the muscle every 30 (thirty) days.    Marland Kitchen doxepin (SINEQUAN) 100 MG capsule Take 100 mg by mouth at bedtime.    Marland Kitchen esomeprazole (NEXIUM) 40 MG capsule Take 40 mg by mouth 2 (two) times daily.    . Multiple Vitamin (MULTIVITAMIN) tablet Take 1 tablet by mouth daily.    . pantoprazole (PROTONIX) 40 MG tablet Take 1 tablet by mouth 2 (two) times daily.  11  . sertraline (ZOLOFT) 100 MG tablet Take 1 tablet by mouth daily.    Marland Kitchen zolpidem (AMBIEN) 10 MG tablet TAKE 1/2 TO 1 TABLET BY MOUTH EVERY NIGHT AT BEDTIME    . ondansetron (ZOFRAN) 8 MG tablet One pill every 8 hours as needed for nausea/vomitting. 40 tablet 1  . oxyCODONE (OXY IR/ROXICODONE) 5 MG immediate release tablet Take 1 tablet (5 mg total) by mouth every 8 (eight) hours as needed for severe pain. Must last 30 days 90 tablet 0  . [START ON 06/21/2021] oxyCODONE (OXY IR/ROXICODONE) 5 MG immediate release tablet Take 1 tablet (5 mg total) by mouth every 8 (eight) hours as needed for severe pain. Must last 30 days 90 tablet 0  . [START ON 07/21/2021] oxyCODONE (OXY IR/ROXICODONE) 5 MG immediate release tablet Take 1 tablet (5 mg total) by mouth every 8 (eight) hours as needed for severe pain. Must last 30 days 90 tablet 0   No current facility-administered medications for this visit.      PHYSICAL EXAMINATION:   Vitals:   05/20/21 1259  BP: 122/71  Pulse: 92  Resp: 14  Temp: 98.1 F (36.7 C)  SpO2: 96%   Filed Weights   05/20/21 1259  Weight: 146 lb 9.6 oz (66.5 kg)    Physical Exam HENT:     Head: Normocephalic and atraumatic.     Mouth/Throat:     Pharynx: No oropharyngeal exudate.  Eyes:     Pupils: Pupils are equal, round, and reactive to light.  Cardiovascular:     Rate and Rhythm: Normal rate and regular rhythm.  Pulmonary:     Effort: Pulmonary effort is normal. No respiratory distress.     Breath sounds: Normal breath sounds. No wheezing.  Abdominal:     General: Bowel sounds are normal. There is no  distension.     Palpations: Abdomen is soft. There is no mass.     Tenderness: no abdominal tenderness There is no guarding or rebound.  Musculoskeletal:        General: No tenderness. Normal range of motion.     Cervical back: Normal range of motion and neck supple.  Skin:    General: Skin is warm.  Neurological:     Mental Status: She is alert and oriented to person, place, and time.  Psychiatric:        Mood and Affect: Affect normal.    LABORATORY DATA:  I have reviewed the data as listed Lab Results  Component Value Date   WBC 9.2 05/20/2021   HGB 13.1 05/20/2021   HCT 40.5 05/20/2021   MCV 86.4 05/20/2021  PLT 356 05/20/2021   Recent Labs    10/19/20 1238 01/18/21 1230 05/20/21 1243  NA 138 135 135  K 4.2 4.2 4.2  CL 103 105 104  CO2 29 22 24   GLUCOSE 106* 90 83  BUN 12 12 14   CREATININE 1.38* 1.11* 1.03*  CALCIUM 9.2 9.2 9.2  GFRNONAA 38* 49* 53*     DG Thoracic Spine 2 View  Result Date: 05/03/2021 CLINICAL DATA:  Upper back pain EXAM: THORACIC SPINE 2 VIEWS COMPARISON:  None. FINDINGS: There is no evidence of thoracic spine fracture. Thoracolumbar scoliotic curvature. No static listhesis. Multilevel intervertebral disc height loss and endplate spurring throughout the thoracic spine as well as within the visualized lower cervical spine and upper lumbar spine. No other significant bone abnormalities are identified. Aortic atherosclerosis. Large hiatal hernia is seen. IMPRESSION: 1. No acute osseous abnormality of the thoracic spine. 2. Advanced multilevel degenerative changes throughout the thoracic spine. 3. Thoracolumbar scoliotic curvature. 4. Large hiatal hernia. Electronically Signed   By: D.O.   On: 05/03/2021 10:48    Anemia due to stage 3b chronic kidney disease (HCC) # Anemia iron def/CKD stageIII : Hb-8-9 [MCV- 77] s/p IV iron infusion hemoglobin up to 13 today. Proceed with Venofer today. PO iron significant GI discomfort [even with PO  every otherday]  # CKDstage III-stable.  # Chronic back pain/? scaitica [Dr.Neivera]-stable.  # DISPOSITION: # Venofer today # follow up in 4 months- MD; labs- cbc/bmp- possible IVvenofer- Dr.B  Cc; Dr.Hedrick    All questions were answered. The patient knows to call the clinic with any problems, questions or concerns.      Duanne Guess, MD 05/26/2021 9:41 PM

## 2021-05-20 NOTE — Patient Instructions (Signed)

## 2021-05-21 DIAGNOSIS — M5135 Other intervertebral disc degeneration, thoracolumbar region: Secondary | ICD-10-CM | POA: Insufficient documentation

## 2021-05-21 DIAGNOSIS — M4185 Other forms of scoliosis, thoracolumbar region: Secondary | ICD-10-CM | POA: Insufficient documentation

## 2021-05-21 DIAGNOSIS — M4155 Other secondary scoliosis, thoracolumbar region: Secondary | ICD-10-CM | POA: Insufficient documentation

## 2021-05-21 DIAGNOSIS — K449 Diaphragmatic hernia without obstruction or gangrene: Secondary | ICD-10-CM | POA: Insufficient documentation

## 2021-05-21 DIAGNOSIS — M5134 Other intervertebral disc degeneration, thoracic region: Secondary | ICD-10-CM | POA: Insufficient documentation

## 2021-05-21 NOTE — Progress Notes (Signed)
PROVIDER NOTE: Information contained herein reflects review and annotations entered in association with encounter. Interpretation of such information and data should be left to medically-trained personnel. Information provided to patient can be located elsewhere in the medical record under "Patient Instructions". Document created using STT-dictation technology, any transcriptional errors that may result from process are unintentional.    Patient: Cynthia Bennett  Service Category: E/M  Provider: Gaspar Cola, MD  DOB: 1933-04-21  DOS: 05/22/2021  Specialty: Interventional Pain Management  MRN: [id number]  Setting: Ambulatory outpatient  PCP: [name], MD  Type: Established Patient    Referring Provider: [name], MD  Location: Office  Delivery: Face-to-face     HPI  Cynthia Bennett, a 87 y.o. year old female, is here today because of her Chronic bilateral low back pain without sciatica [M54.50, G89.29]. [name] primary complain today is Back Pain Last encounter: My last encounter with her was on 05/02/2021. Pertinent problems: [name] has Fibromyalgia; Chronic pain syndrome; Lumbar radicular pain (Bilateral) (R>L); Chronic low back pain (1ry area of Pain) (Bilateral) (L>R) w/o sciatica; Grade 1 spondylolisthesis of L4 over L5; Lumbar discogenic pain syndrome; Lumbar facet syndrome (Bilateral) (L>R); Lumbar facet hypertrophy (Bilateral); Chronic sacroiliac joint pain (Left); Chronic hip pain (Left); Lumbar spondylosis; Abnormal MRI, lumbar spine (06/25/2016); Cervical spondylosis (Anterolisthesis of C5 over C6); Chronic lower extremity pain (2ry area of Pain) (Left); Chronic lumbar radicular pain (Left) (L5 Dermatome); Osteoarthritis of hip (Left); Chronic sacroiliac joint pain (Bilateral) (R>L); Chronic shoulder pain (Left); Osteoarthritis of shoulder (Left); Chronic sacroiliac joint pain (Right); Chronic hip pain (Right); Left upper arm pain; Myofascial pain syndrome (left upper  extremity); Acromioclavicular joint pain (Left); Osteoarthritis of AC (acromioclavicular) joint (Left); Supraspinatus tendon tear, sequela (Left); Spondylosis without myelopathy or radiculopathy, lumbosacral region; Other specified dorsopathies, sacral and sacrococcygeal region; Bertolotti's syndrome (Left); DDD (degenerative disc disease), lumbar; Severe (5-6 mm) L4-5 lumbar spinal stenosis and (8 mm) L3-4 spinal stenosis.; Osteoarthritis of hip (Right); Trigger point with back pain (Right); Chronic myofascial pain; Chronic low back pain (Right) w/o sciatica; Chronic hip pain (3ry area of Pain) (Bilateral) (R>L); Osteoarthritis of hips (Bilateral); Chronic low back pain (Left) w/o sciatica; Thoracic back pain (Left); DDD (degenerative disc disease), thoracic; DDD (degenerative disc disease), thoracolumbar; and Scoliosis of thoracolumbar region due to degenerative disease of spine in adult on their pertinent problem list. Pain Assessment: Severity of Chronic pain is reported as a 8 /10. Location: Back Medial, Lateral/radiates around to the front and down inner leg  to the top of foot effects great toe and two toes beside that. Onset: More than a month ago. Quality: Aching, Constant, Stabbing. Timing: Constant. Modifying factor(s): rest. Vitals:  height is 5' 2"  (1.575 m) and weight is 146 lb (66.2 kg). Her temperature is 97.7 F (36.5 C). Her blood pressure is 141/84 (abnormal) and her pulse is 100. Her respiration is 16 and oxygen saturation is 98%.   Reason for encounter: medication management and to go over the results of the x-rays done by Dr. Holley Raring.  On 03/28/2021 I had ordered x-rays of the thoracic spine but they expired on 04/28/2021.  Several days later, on 05/02/2021, the patient decided to have the x-rays done and they had to be reordered by Dr. Holley Raring since the original prescription had expired.  The x-rays were read as negative for any type of acute abnormalities.  She does have thoracolumbar  scoliosis, advanced multilevel degenerative disc disease throughout the thoracic spine, and a large  hiatal hernia. No adverse reactions or side effects reported to the medications. UDS ordered today.   The patient indicates that her primary pain today is that of the lower back, bilaterally, with the left side being worse than the right.  She has pain going down the left lower extremity to the back of the knee with numbness on the top of the foot and big toe and what appears to be an L5 dermatomal distribution.  The patient indicates that she would like to have an epidural steroid injection done, but she wants to wait for couple days since she was diagnosed with an iron deficiency anemia and she has been getting iron infusions at the cancer center, after which she normally feels really bad for about 4 days.  She wants to time her procedure to be after she recovers from her recent iron infusion.  RTCB: 08/20/2021  Pharmacotherapy Assessment  Analgesic: Oxycodone IR 5 mg, 1 tab PO 3 times daily (maximum of 15 mg/day of oxycodone.) MME/day:  22.5 mg/day.   Monitoring: [place] PMP: PDMP reviewed during this encounter.       Pharmacotherapy: No side-effects or adverse reactions reported. Compliance: No problems identified. Effectiveness: Clinically acceptable.  Ignatius Specking, RN  05/22/2021  2:49 PM  Sign when Signing Visit Did not bring medications today.    UDS:  Summary  Date Value Ref Range Status  04/10/2020 Note  Final    Comment:    ==================================================================== [employer] Select 13 ([facility]) ==================================================================== Test                             Result       Flag       Units Drug Present and Declared for Prescription Verification   Oxycodone                      919          EXPECTED   ng/mg creat   Oxymorphone                    57           EXPECTED   ng/mg creat   Noroxycodone                   3286          EXPECTED   ng/mg creat    Sources of oxycodone include scheduled prescription medications.    Oxymorphone and noroxycodone are expected metabolites of oxycodone.    Oxymorphone is also available as a scheduled prescription medication. ==================================================================== Test                      Result    Flag   Units      Ref Range   Creatinine              168              mg/dL      >=20 ==================================================================== Declared Medications:  The flagging and interpretation on this report are based on the  following declared medications.  Unexpected results may arise from  inaccuracies in the declared medications.  **Note: The testing scope of this panel includes these medications:  Oxycodone (Roxicodone)  **Note: The testing scope of this panel does not include the  following reported medications:  Acetaminophen (Tylenol)  Bupropion (Wellbutrin SR)  Dorzolamide  Doxepin  Esomeprazole (Nexium)  Iron  Multivitamin  Pantoprazole (Protonix)  Sertraline (Zoloft)  Timolol  Vitamin D3  Zolpidem (Ambien) ==================================================================== For clinical consultation, please call [phone]. ====================================================================      ROS  Constitutional: Denies any fever or chills Gastrointestinal: No reported hemesis, hematochezia, vomiting, or acute GI distress Musculoskeletal: Denies any acute onset joint swelling, redness, loss of ROM, or weakness Neurological: No reported episodes of acute onset apraxia, aphasia, dysarthria, agnosia, amnesia, paralysis, loss of coordination, or loss of consciousness  Medication Review  Vitamin D3, acetaminophen, buPROPion, cyanocobalamin, doxepin, esomeprazole, multivitamin, ondansetron, oxyCODONE, pantoprazole, sertraline, and zolpidem  History Review  Allergy: Ms. Lemanski is allergic to ibuprofen. Drug:  Ms. Aure  reports no history of drug use. Alcohol:  reports no history of alcohol use. Tobacco:  reports that she has quit smoking. She has never used smokeless tobacco. Social: Ms. Lady  reports that she has quit smoking. She has never used smokeless tobacco. She reports that she does not drink alcohol and does not use drugs. Medical:  has a past medical history of Dizzy spells, Fibromyalgia, Glaucoma, Hiatal hernia, Osteoarthritis, Osteoarthritis of spine with radiculopathy, lumbar region (08/14/2015), Peptic ulcer disease, Reflux, Spinal stenosis, Stroke ([place]), and TIA (transient ischemic attack). Surgical: Ms. Valtierra  has a past surgical history that includes Esophagogastroduodenoscopy (N/A, 04/16/2015); Appendectomy; and Abdominal hysterectomy. Family: family history includes COPD in her father; Mental illness in her mother.  Laboratory Chemistry Profile   Renal Lab Results  Component Value Date   BUN 14 05/20/2021   CREATININE 1.03 (H) 05/20/2021   BCR 11 (L) 01/10/2019   GFRAA 46 (L) 01/10/2019   GFRNONAA 53 (L) 05/20/2021    Hepatic Lab Results  Component Value Date   AST 18 01/10/2019   ALT 11 (L) 06/24/2016   ALBUMIN 4.3 01/10/2019   ALKPHOS 74 01/10/2019   LIPASE 139 04/27/2014    Electrolytes Lab Results  Component Value Date   NA 135 05/20/2021   K 4.2 05/20/2021   CL 104 05/20/2021   CALCIUM 9.2 05/20/2021   MG 2.3 01/10/2019    Bone Lab Results  Component Value Date   25OHVITD1 26 (L) 01/10/2019   25OHVITD2 1.6 01/10/2019   25OHVITD3 24 01/10/2019    Inflammation (CRP: Acute Phase) (ESR: Chronic Phase) Lab Results  Component Value Date   CRP 0.9 08/24/2020   ESRSEDRATE 51 (H) 01/10/2019       Note: Above Lab results reviewed.  Recent Imaging Review  DG Thoracic Spine 2 View CLINICAL DATA:  Upper back pain  EXAM: THORACIC SPINE 2 VIEWS  COMPARISON:  None.  FINDINGS: There is no evidence of thoracic spine fracture. Thoracolumbar scoliotic  curvature. No static listhesis. Multilevel intervertebral disc height loss and endplate spurring throughout the thoracic spine as well as within the visualized lower cervical spine and upper lumbar spine. No other significant bone abnormalities are identified. Aortic atherosclerosis. Large hiatal hernia is seen.  IMPRESSION: 1. No acute osseous abnormality of the thoracic spine. 2. Advanced multilevel degenerative changes throughout the thoracic spine. 3. Thoracolumbar scoliotic curvature. 4. Large hiatal hernia.  Electronically Signed   By: Davina Poke D.O.   On: 05/03/2021 10:48 Note: Reviewed        Physical Exam  General appearance: Well nourished, well developed, and well hydrated. In no apparent acute distress Mental status: Alert, oriented x 3 (person, place, & time)       Respiratory: No evidence of acute respiratory distress Eyes: PERLA Vitals: BP (!) 141/84  Pulse 100   Temp 97.7 F (36.5 C)   Resp 16   Ht 5' 2"  (1.575 m)   Wt 146 lb (66.2 kg)   SpO2 98%   BMI 26.70 kg/m  BMI: Estimated body mass index is 26.7 kg/m as calculated from the following:   Height as of this encounter: 5' 2"  (1.575 m).   Weight as of this encounter: 146 lb (66.2 kg). Ideal: Ideal body weight: 50.1 kg (110 lb 7.2 oz) Adjusted ideal body weight: 56.5 kg (124 lb 10.7 oz)  Assessment   Status Diagnosis  Controlled Controlled Controlled 1. Chronic low back pain (1ry area of Pain) (Bilateral) (L>R) w/o sciatica   2. DDD (degenerative disc disease), thoracolumbar   3. Severe (5-6 mm) L4-5 lumbar spinal stenosis and (8 mm) L3-4 spinal stenosis.   4. Chronic lower extremity pain (2ry area of Pain) (Left)   5. Chronic hip pain (3ry area of Pain) (Bilateral) (R>L)   6. Scoliosis of thoracolumbar region due to degenerative disease of spine in adult   7. DDD (degenerative disc disease), thoracic   8. Chronic pain syndrome   9. Pharmacologic therapy   10. Chronic use of opiate for  therapeutic purpose   11. Encounter for medication management   12. Hiatal hernia      Updated Problems: No problems updated.   Plan of Care  Problem-specific:  No problem-specific Assessment & Plan notes found for this encounter.  Ms. CELICIA MINAHAN has a current medication list which includes the following long-term medication(s): bupropion, doxepin, oxycodone, [START ON 06/21/2021] oxycodone, [START ON 07/21/2021] oxycodone, pantoprazole, and zolpidem.  Pharmacotherapy (Medications Ordered): Meds ordered this encounter  Medications   oxyCODONE (OXY IR/ROXICODONE) 5 MG immediate release tablet    Sig: Take 1 tablet (5 mg total) by mouth every 8 (eight) hours as needed for severe pain. Must last 30 days    Dispense:  90 tablet    Refill:  0    Not a duplicate. Do NOT delete! Dispense 1 day early if closed on refill date. Avoid benzodiazepines within 8 hours of opioids. Do not send refill requests.   oxyCODONE (OXY IR/ROXICODONE) 5 MG immediate release tablet    Sig: Take 1 tablet (5 mg total) by mouth every 8 (eight) hours as needed for severe pain. Must last 30 days    Dispense:  90 tablet    Refill:  0    Not a duplicate. Do NOT delete! Dispense 1 day early if closed on refill date. Avoid benzodiazepines within 8 hours of opioids. Do not send refill requests.   oxyCODONE (OXY IR/ROXICODONE) 5 MG immediate release tablet    Sig: Take 1 tablet (5 mg total) by mouth every 8 (eight) hours as needed for severe pain. Must last 30 days    Dispense:  90 tablet    Refill:  0    Not a duplicate. Do NOT delete! Dispense 1 day early if closed on refill date. Avoid benzodiazepines within 8 hours of opioids. Do not send refill requests.    Orders:  Orders Placed This Encounter  Procedures   Lumbar Epidural Injection    Standing Status:   Future    Standing Expiration Date:   06/22/2021    Scheduling Instructions:     Procedure: Interlaminar Lumbar Epidural Steroid injection (LESI)  L4-5      Laterality: Left-sided     Sedation: None required.     Timeframe: ASAA    Order Specific Question:  Where will this procedure be performed?    Answer:   [facility]   [employer] Select 13 (MW), Urine    Volume: 30 ml(s). Minimum 3 ml of urine is needed. Document temperature of fresh sample. Indications: Long term (current) use of opiate analgesic (K10.312)    Order Specific Question:   Release to patient    Answer:   Immediate    Follow-up plan:   Return for Procedure (no sedation): (L) L4-5 LESI.     Interventional Therapies  Risk  Complexity Considerations:   Advanced age   Planned  Pending:   Diagnostic left L5 transverse process-sacral ala pseudoarthrosis injection.  If he ends up getting good relief of the pain, at least for the duration of the numbing medicine, she may be a good candidate for corrective surgery of the affected area.   Under consideration:   Diagnostic right lumbar facet block #1  Diagnostic right ischial bursa injection #1  Possible bilateral lumbar facet RFA  Possible bilateral sacroiliac joint RFA  Diagnostic left L4 TFESI  Diagnostic left IA hip joint injection   Diagnostic bilateral femoral nerve + obturator NB  Possible bilateral femoral nerve + obturator nerve RFA    Completed:   Therapeutic left L4-5 LESI x6 (04/10/2020)  Therapeutic right L4-5 LESI x1 (07/21/2017)  Palliative right L5-S1 LESI x1 (09/27/2015)  Therapeutic left L5-S1 LESI x1 (02/05/2016)  Diagnostic right SI joint injection x2 (08/25/2019)  Diagnostic left SI joint injection x2 (01/27/2019)  Diagnostic left glenohumeral joint injection x1 (04/29/2017)  Left biceps muscle trigger point injection x1 (07/21/2017)  Right PSIS trigger point injection x1 (07/27/2018)  Diagnostic right lumbar facet block x1 (10/23/2020)  Diagnostic left lumbar facet block x2 (10/23/2020)  Diagnostic left IA hip injection x1 (01/27/2019)    Palliative options:   None at this time    Recent  Visits Date Type Provider Dept  03/28/21 Telemedicine Milinda Pointer, MD [facility]  03/12/21 Procedure visit Milinda Pointer, MD [facility]  02/21/21 Office Visit Milinda Pointer, MD [facility]  Showing recent visits within past 90 days and meeting all other requirements Today's Visits Date Type Provider Dept  05/22/21 Office Visit Milinda Pointer, MD [facility]  Showing today's visits and meeting all other requirements Future Appointments Date Type Provider Dept  08/14/21 Appointment Milinda Pointer, MD [facility]  Showing future appointments within next 90 days and meeting all other requirements I discussed the assessment and treatment plan with the patient. The patient was provided an opportunity to ask questions and all were answered. The patient agreed with the plan and demonstrated an understanding of the instructions.  Patient advised to call back or seek an in-person evaluation if the symptoms or condition worsens.  Duration of encounter: 30 minutes.  Note by: Gaspar Cola, MD Date: 05/22/2021; Time: 3:05 PM

## 2021-05-22 ENCOUNTER — Other Ambulatory Visit: Payer: Self-pay

## 2021-05-22 ENCOUNTER — Ambulatory Visit: Payer: PPO | Attending: Pain Medicine | Admitting: Pain Medicine

## 2021-05-22 VITALS — BP 141/84 | HR 100 | Temp 97.7°F | Resp 16 | Ht 62.0 in | Wt 146.0 lb

## 2021-05-22 DIAGNOSIS — M48062 Spinal stenosis, lumbar region with neurogenic claudication: Secondary | ICD-10-CM

## 2021-05-22 DIAGNOSIS — M546 Pain in thoracic spine: Secondary | ICD-10-CM | POA: Diagnosis not present

## 2021-05-22 DIAGNOSIS — M545 Low back pain, unspecified: Secondary | ICD-10-CM

## 2021-05-22 DIAGNOSIS — K449 Diaphragmatic hernia without obstruction or gangrene: Secondary | ICD-10-CM

## 2021-05-22 DIAGNOSIS — Z79891 Long term (current) use of opiate analgesic: Secondary | ICD-10-CM | POA: Diagnosis not present

## 2021-05-22 DIAGNOSIS — Z79899 Other long term (current) drug therapy: Secondary | ICD-10-CM | POA: Diagnosis not present

## 2021-05-22 DIAGNOSIS — M4185 Other forms of scoliosis, thoracolumbar region: Secondary | ICD-10-CM | POA: Diagnosis not present

## 2021-05-22 DIAGNOSIS — M25551 Pain in right hip: Secondary | ICD-10-CM | POA: Diagnosis not present

## 2021-05-22 DIAGNOSIS — G894 Chronic pain syndrome: Secondary | ICD-10-CM | POA: Diagnosis not present

## 2021-05-22 DIAGNOSIS — M79605 Pain in left leg: Secondary | ICD-10-CM | POA: Diagnosis not present

## 2021-05-22 DIAGNOSIS — M25552 Pain in left hip: Secondary | ICD-10-CM

## 2021-05-22 DIAGNOSIS — M5135 Other intervertebral disc degeneration, thoracolumbar region: Secondary | ICD-10-CM | POA: Diagnosis not present

## 2021-05-22 DIAGNOSIS — M5134 Other intervertebral disc degeneration, thoracic region: Secondary | ICD-10-CM | POA: Diagnosis not present

## 2021-05-22 DIAGNOSIS — G8929 Other chronic pain: Secondary | ICD-10-CM | POA: Diagnosis not present

## 2021-05-22 DIAGNOSIS — M4155 Other secondary scoliosis, thoracolumbar region: Secondary | ICD-10-CM

## 2021-05-22 MED ORDER — OXYCODONE HCL 5 MG PO TABS: 5.0000 mg | ORAL_TABLET | Freq: Three times a day (TID) | ORAL | 0 refills | Status: DC | PRN

## 2021-05-22 NOTE — Patient Instructions (Signed)
____________________________________________________________________________________________  Preparing for Procedure with Sedation  Procedure appointments are limited to planned procedures: No Prescription Refills. No disability issues will be discussed. No medication changes will be discussed.  Instructions: Oral Intake: Do not eat or drink anything for at least 8 hours prior to your procedure. (Exception: Blood Pressure Medication. See below.) Transportation: Unless otherwise stated by your physician, you may drive yourself after the procedure. Blood Pressure Medicine: Do not forget to take your blood pressure medicine with a sip of water the morning of the procedure. If your Diastolic (lower reading)is above 100 mmHg, elective cases will be cancelled/rescheduled. Blood thinners: These will need to be stopped for procedures. Notify our staff if you are taking any blood thinners. Depending on which one you take, there will be specific instructions on how and when to stop it. Diabetics on insulin: Notify the staff so that you can be scheduled 1st case in the morning. If your diabetes requires high dose insulin, take only  of your normal insulin dose the morning of the procedure and notify the staff that you have done so. Preventing infections: Shower with an antibacterial soap the morning of your procedure. Build-up your immune system: Take 1000 mg of Vitamin C with every meal (3 times a day) the day prior to your procedure. Antibiotics: Inform the staff if you have a condition or reason that requires you to take antibiotics before dental procedures. Pregnancy: If you are pregnant, call and cancel the procedure. Sickness: If you have a cold, fever, or any active infections, call and cancel the procedure. Arrival: You must be in the facility at least 30 minutes prior to your scheduled procedure. Children: Do not bring children with you. Dress appropriately: Bring dark clothing that you would  not mind if they get stained. Valuables: Do not bring any jewelry or valuables.  Reasons to call and reschedule or cancel your procedure: (Following these recommendations will minimize the risk of a serious complication.) Surgeries: Avoid having procedures within 2 weeks of any surgery. (Avoid for 2 weeks before or after any surgery). Flu Shots: Avoid having procedures within 2 weeks of a flu shots or . (Avoid for 2 weeks before or after immunizations). Barium: Avoid having a procedure within 7-10 days after having had a radiological study involving the use of radiological contrast. (Myelograms, Barium swallow or enema study). Heart attacks: Avoid any elective procedures or surgeries for the initial 6 months after a "Myocardial Infarction" (Heart Attack). Blood thinners: It is imperative that you stop these medications before procedures. Let us know if you if you take any blood thinner.  Infection: Avoid procedures during or within two weeks of an infection (including chest colds or gastrointestinal problems). Symptoms associated with infections include: Localized redness, fever, chills, night sweats or profuse sweating, burning sensation when voiding, cough, congestion, stuffiness, runny nose, sore throat, diarrhea, nausea, vomiting, cold or Flu symptoms, recent or current infections. It is specially important if the infection is over the area that we intend to treat. Heart and lung problems: Symptoms that may suggest an active cardiopulmonary problem include: cough, chest pain, breathing difficulties or shortness of breath, dizziness, ankle swelling, uncontrolled high or unusually low blood pressure, and/or palpitations. If you are experiencing any of these symptoms, cancel your procedure and contact your primary care physician for an evaluation.  Remember:  Regular Business hours are:  Monday to Thursday 8:00 AM to 4:00 PM  Provider's Schedule: Keyonni Percival, MD:  Procedure days: Tuesday and  Thursday 7:30 AM   to 4:00 PM  Bilal Lateef, MD:  Procedure days: Monday and Wednesday 7:30 AM to 4:00 PM ____________________________________________________________________________________________  ____________________________________________________________________________________________  General Risks and Possible Complications  Patient Responsibilities: It is important that you read this as it is part of your informed consent. It is our duty to inform you of the risks and possible complications associated with treatments offered to you. It is your responsibility as a patient to read this and to ask questions about anything that is not clear or that you believe was not covered in this document.  Patient's Rights: You have the right to refuse treatment. You also have the right to change your mind, even after initially having agreed to have the treatment done. However, under this last option, if you wait until the last second to change your mind, you may be charged for the materials used up to that point.  Introduction: Medicine is not an exact science. Everything in Medicine, including the lack of treatment(s), carries the potential for danger, harm, or loss (which is by definition: Risk). In Medicine, a complication is a secondary problem, condition, or disease that can aggravate an already existing one. All treatments carry the risk of possible complications. The fact that a side effects or complications occurs, does not imply that the treatment was conducted incorrectly. It must be clearly understood that these can happen even when everything is done following the highest safety standards.  No treatment: You can choose not to proceed with the proposed treatment alternative. The "PRO(s)" would include: avoiding the risk of complications associated with the therapy. The "CON(s)" would include: not getting any of the treatment benefits. These benefits fall under one of three categories: diagnostic;  therapeutic; and/or palliative. Diagnostic benefits include: getting information which can ultimately lead to improvement of the disease or symptom(s). Therapeutic benefits are those associated with the successful treatment of the disease. Finally, palliative benefits are those related to the decrease of the primary symptoms, without necessarily curing the condition (example: decreasing the pain from a flare-up of a chronic condition, such as incurable terminal cancer).  General Risks and Complications: These are associated to most interventional treatments. They can occur alone, or in combination. They fall under one of the following six (6) categories: no benefit or worsening of symptoms; bleeding; infection; nerve damage; allergic reactions; and/or death. No benefits or worsening of symptoms: In Medicine there are no guarantees, only probabilities. No healthcare provider can ever guarantee that a medical treatment will work, they can only state the probability that it may. Furthermore, there is always the possibility that the condition may worsen, either directly, or indirectly, as a consequence of the treatment. Bleeding: This is more common if the patient is taking a blood thinner, either prescription or over the counter (example: Goody Powders, Fish oil, Aspirin, Garlic, etc.), or if suffering a condition associated with impaired coagulation (example: Hemophilia, cirrhosis of the liver, low platelet counts, etc.). However, even if you do not have one on these, it can still happen. If you have any of these conditions, or take one of these drugs, make sure to notify your treating physician. Infection: This is more common in patients with a compromised immune system, either due to disease (example: diabetes, cancer, human immunodeficiency virus [HIV], etc.), or due to medications or treatments (example: therapies used to treat cancer and rheumatological diseases). However, even if you do not have one on  these, it can still happen. If you have any of these conditions, or take one of   these drugs, make sure to notify your treating physician. Nerve Damage: This is more common when the treatment is an invasive one, but it can also happen with the use of medications, such as those used in the treatment of cancer. The damage can occur to small secondary nerves, or to large primary ones, such as those in the spinal cord and brain. This damage may be temporary or permanent and it may lead to impairments that can range from temporary numbness to permanent paralysis and/or brain death. Allergic Reactions: Any time a substance or material comes in contact with our body, there is the possibility of an allergic reaction. These can range from a mild skin rash (contact dermatitis) to a severe systemic reaction (anaphylactic reaction), which can result in death. Death: In general, any medical intervention can result in death, most of the time due to an unforeseen complication. ____________________________________________________________________________________________  

## 2021-05-22 NOTE — Progress Notes (Signed)
Did not bring medications today.

## 2021-05-26 ENCOUNTER — Encounter: Payer: Self-pay | Admitting: Internal Medicine

## 2021-05-28 LAB — TOXASSURE SELECT 13 (MW), URINE

## 2021-06-03 NOTE — Progress Notes (Signed)
PROVIDER NOTE: Information contained herein reflects review and annotations entered in association with encounter. Interpretation of such information and data should be left to medically-trained personnel. Information provided to patient can be located elsewhere in the medical record under "Patient Instructions". Document created using STT-dictation technology, any transcriptional errors that may result from process are unintentional.    Patient: Cynthia Bennett  Service Category: Procedure  Provider: Oswaldo Done, MD  DOB: 1933-07-23  DOS: 06/04/2021  Location: [facility]  MRN: [id number]  Setting: Ambulatory - outpatient  Referring Provider: Jerl Mina, MD  Type: Established Patient  Specialty: Interventional Pain Management  PCP: Jerl Mina, MD   Primary Reason for Visit: Interventional Pain Management Treatment. CC: Back Pain (lower)  Procedure:          Anesthesia, Analgesia, Anxiolysis:  Type: Therapeutic Inter-Laminar Epidural Steroid Injection           Region: Lumbar Level: L4-5 Level. Laterality: Left         Type: Local Anesthesia Indication(s): Analgesia         Route: Infiltration ([place]/IM) IV Access: Declined Sedation: Declined  Local Anesthetic: Lidocaine 1-2%  Position: Prone with head of the table was raised to facilitate breathing.   Indications: 1. Chronic low back pain (1ry area of Pain) (Bilateral) (L>R) w/o sciatica   2. Chronic lower extremity pain (2ry area of Pain) (Left)   3. Chronic lumbar radicular pain (Left) (L5 Dermatome)   4. DDD (degenerative disc disease), lumbar   5. Grade 1 spondylolisthesis of L4 over L5   6. Lumbar radicular pain (Bilateral) (R>L)   7. Severe (5-6 mm) L4-5 lumbar spinal stenosis and (8 mm) L3-4 spinal stenosis.    Pain Score: Pre-procedure: 8 /10 Post-procedure: 0-No pain/10   Pre-op H&P Assessment:  Cynthia Bennett is a 87 y.o. (year old), female patient, seen today for interventional treatment. She  has a  past surgical history that includes Esophagogastroduodenoscopy (N/A, 04/16/2015); Appendectomy; and Abdominal hysterectomy. Cynthia Bennett has a current medication list which includes the following prescription(s): acetaminophen, bupropion, cyanocobalamin, doxepin, esomeprazole, multivitamin, ondansetron, oxycodone, [START ON 06/21/2021] oxycodone, [START ON 07/21/2021] oxycodone, pantoprazole, sertraline, zolpidem, and vitamin d3. Her primarily concern today is the Back Pain (lower)  Initial Vital Signs:  Pulse/HCG Rate: 82ECG Heart Rate: 82 Temp: (!) 96.7 F (35.9 C) Resp: 18 BP: (!) 143/88 SpO2: 99 %  BMI: Estimated body mass index is 26.52 kg/m as calculated from the following:   Height as of this encounter: 5\' 2"  (1.575 m).   Weight as of this encounter: 145 lb (65.8 kg).  Risk Assessment: Allergies: Reviewed. She is allergic to ibuprofen.  Allergy Precautions: None required Coagulopathies: Reviewed. None identified.  Blood-thinner therapy: None at this time Active Infection(s): Reviewed. None identified. Cynthia Bennett is afebrile  Site Confirmation: Cynthia Bennett was asked to confirm the procedure and laterality before marking the site Procedure checklist: Completed Consent: Before the procedure and under the influence of no sedative(s), amnesic(s), or anxiolytics, the patient was informed of the treatment options, risks and possible complications. To fulfill our ethical and legal obligations, as recommended by the American Medical Association's Code of Ethics, I have informed the patient of my clinical impression; the nature and purpose of the treatment or procedure; the risks, benefits, and possible complications of the intervention; the alternatives, including doing nothing; the risk(s) and benefit(s) of the alternative treatment(s) or procedure(s); and the risk(s) and benefit(s) of doing nothing. The patient was provided information about the  general risks and possible complications associated with  the procedure. These may include, but are not limited to: failure to achieve desired goals, infection, bleeding, organ or nerve damage, allergic reactions, paralysis, and death. In addition, the patient was informed of those risks and complications associated to Spine-related procedures, such as failure to decrease pain; infection (i.e.: Meningitis, epidural or intraspinal abscess); bleeding (i.e.: epidural hematoma, subarachnoid hemorrhage, or any other type of intraspinal or peri-dural bleeding); organ or nerve damage (i.e.: Any type of peripheral nerve, nerve root, or spinal cord injury) with subsequent damage to sensory, motor, and/or autonomic systems, resulting in permanent pain, numbness, and/or weakness of one or several areas of the body; allergic reactions; (i.e.: anaphylactic reaction); and/or death. Furthermore, the patient was informed of those risks and complications associated with the medications. These include, but are not limited to: allergic reactions (i.e.: anaphylactic or anaphylactoid reaction(s)); adrenal axis suppression; blood sugar elevation that in diabetics may result in ketoacidosis or comma; water retention that in patients with history of congestive heart failure may result in shortness of breath, pulmonary edema, and decompensation with resultant heart failure; weight gain; swelling or edema; medication-induced neural toxicity; particulate matter embolism and blood vessel occlusion with resultant organ, and/or nervous system infarction; and/or aseptic necrosis of one or more joints. Finally, the patient was informed that Medicine is not an exact science; therefore, there is also the possibility of unforeseen or unpredictable risks and/or possible complications that may result in a catastrophic outcome. The patient indicated having understood very clearly. We have given the patient no guarantees and we have made no promises. Enough time was given to the patient to ask questions, all  of which were answered to the patient's satisfaction. Cynthia Bennett has indicated that she wanted to continue with the procedure. Attestation: I, the ordering provider, attest that I have discussed with the patient the benefits, risks, side-effects, alternatives, likelihood of achieving goals, and potential problems during recovery for the procedure that I have provided informed consent. Date  Time: 06/04/2021 10:34 AM  Pre-Procedure Preparation:  Monitoring: As per clinic protocol. Respiration, ETCO2, SpO2, BP, heart rate and rhythm monitor placed and checked for adequate function Safety Precautions: Patient was assessed for positional comfort and pressure points before starting the procedure. Time-out: I initiated and conducted the "Time-out" before starting the procedure, as per protocol. The patient was asked to participate by confirming the accuracy of the "Time Out" information. Verification of the correct person, site, and procedure were performed and confirmed by me, the nursing staff, and the patient. "Time-out" conducted as per Joint Commission's Universal Protocol (UP.01.01.01). Time: 1129  Description of Procedure:          Target Area: The interlaminar space, initially targeting the lower laminar border of the superior vertebral body. Approach: Paramedial approach. Area Prepped: Entire Posterior Lumbar Region DuraPrep (Iodine Povacrylex [0.7% available iodine] and Isopropyl Alcohol, 74% w/w) Safety Precautions: Aspiration looking for blood return was conducted prior to all injections. At no point did we inject any substances, as a needle was being advanced. No attempts were made at seeking any paresthesias. Safe injection practices and needle disposal techniques used. Medications properly checked for expiration dates. SDV (single dose vial) medications used. Description of the Procedure: Protocol guidelines were followed. The procedure needle was introduced through the skin, ipsilateral to  the reported pain, and advanced to the target area. Bone was contacted and the needle walked caudad, until the lamina was cleared. The epidural space was identified using "loss-of-resistance  technique" with 2-3 ml of PF-NaCl (0.9% NSS), in a 5cc LOR glass syringe.  Vitals:   06/04/21 1119 06/04/21 1124 06/04/21 1130 06/04/21 1135  BP: 131/73 126/65 123/70 134/72  Pulse:      Resp: 18 18 16 18   Temp:      TempSrc:      SpO2: 97% 97% 97% 95%  Weight:      Height:        Start Time: 1129 hrs. End Time: 1135 hrs.  Materials:  Needle(s) Type: Epidural needle Gauge: 17G Length: 3.5-in Medication(s): Please see orders for medications and dosing details.  Imaging Guidance (Spinal):          Type of Imaging Technique: Fluoroscopy Guidance (Spinal) Indication(s): Assistance in needle guidance and placement for procedures requiring needle placement in or near specific anatomical locations not easily accessible without such assistance. Exposure Time: Please see nurses notes. Contrast: Before injecting any contrast, we confirmed that the patient did not have an allergy to iodine, shellfish, or radiological contrast. Once satisfactory needle placement was completed at the desired level, radiological contrast was injected. Contrast injected under live fluoroscopy. No contrast complications. See chart for type and volume of contrast used. Fluoroscopic Guidance: I was personally present during the use of fluoroscopy. "Tunnel Vision Technique" used to obtain the best possible view of the target area. Parallax error corrected before commencing the procedure. "Direction-depth-direction" technique used to introduce the needle under continuous pulsed fluoroscopy. Once target was reached, antero-posterior, oblique, and lateral fluoroscopic projection used confirm needle placement in all planes. Images permanently stored in EMR. Interpretation: I personally interpreted the imaging intraoperatively. Adequate  needle placement confirmed in multiple planes. Appropriate spread of contrast into desired area was observed. No evidence of afferent or efferent intravascular uptake. No intrathecal or subarachnoid spread observed. Permanent images saved into the patient's record.  Antibiotic Prophylaxis:   Anti-infectives (From admission, onward)    None      Indication(s): None identified  Post-operative Assessment:  Post-procedure Vital Signs:  Pulse/HCG Rate: 8281 Temp: (!) 96.7 F (35.9 C) Resp: 18 BP: 134/72 SpO2: 95 %  EBL: None  Complications: No immediate post-treatment complications observed by team, or reported by patient.  Note: The patient tolerated the entire procedure well. A repeat set of vitals were taken after the procedure and the patient was kept under observation following institutional policy, for this type of procedure. Post-procedural neurological assessment was performed, showing return to baseline, prior to discharge. The patient was provided with post-procedure discharge instructions, including a section on how to identify potential problems. Should any problems arise concerning this procedure, the patient was given instructions to immediately contact , at any time, without hesitation. In any case, we plan to contact the patient by telephone for a follow-up status report regarding this interventional procedure.  Comments:  No additional relevant information.  Plan of Care  Orders:  Orders Placed This Encounter  Procedures   Lumbar Epidural Injection    Scheduling Instructions:     Procedure: Interlaminar LESI L4-5     Laterality: Left-sided     Sedation: No Sedation     Timeframe:  Today    Order Specific Question:   Where will this procedure be performed?    Answer:   [facility]   DG [facility] C-ARM 1-60 MIN NO REPORT    Intraoperative interpretation by procedural physician at [facility].    Standing Status:   Standing    Number of  Occurrences:  1    Order Specific Question:   Reason for exam:    Answer:   Assistance in needle guidance and placement for procedures requiring needle placement in or near specific anatomical locations not easily accessible without such assistance.   Informed Consent Details: Physician/Practitioner Attestation; Transcribe to consent form and obtain patient signature    Note: Always confirm laterality of pain with Ms. Cumbee, before procedure. Transcribe to consent form and obtain patient signature.    Order Specific Question:   Physician/Practitioner attestation of informed consent for procedure/surgical case    Answer:   I, the physician/practitioner, attest that I have discussed with the patient the benefits, risks, side effects, alternatives, likelihood of achieving goals and potential problems during recovery for the procedure that I have provided informed consent.    Order Specific Question:   Procedure    Answer:   Lumbar epidural steroid injection under fluoroscopic guidance    Order Specific Question:   Physician/Practitioner performing the procedure    Answer:   Elster Corbello A. Laban EmperorNaveira, MD    Order Specific Question:   Indication/Reason    Answer:   Low back and/or lower extremity pain secondary to lumbar radiculitis   Provide equipment / supplies at bedside    "Epidural Tray" (Disposable  single use) Catheter: NOT required    Standing Status:   Standing    Number of Occurrences:   1    Order Specific Question:   Specify    Answer:   Epidural Tray    Chronic Opioid Analgesic:  Oxycodone IR 5 mg, 1 tab PO 3 times daily (maximum of 15 mg/day of oxycodone.) MME/day:  22.5 mg/day.   Medications ordered for procedure: Meds ordered this encounter  Medications   iohexol (OMNIPAQUE) 180 MG/ML injection 10 mL    Must be Myelogram-compatible. If not available, you may substitute with a water-soluble, non-ionic, hypoallergenic, myelogram-compatible radiological contrast medium.   lidocaine  (XYLOCAINE) 2 % (with pres) injection 400 mg   triamcinolone acetonide (KENALOG-40) injection 40 mg   sodium chloride flush (NS) 0.9 % injection 2 mL   ropivacaine (PF) 2 mg/mL (0.2%) (NAROPIN) injection 2 mL    Medications administered: We administered iohexol, lidocaine, triamcinolone acetonide, sodium chloride flush, and ropivacaine (PF) 2 mg/mL (0.2%).  See the medical record for exact dosing, route, and time of administration.  Follow-up plan:   Return in about 2 weeks (around 06/18/2021) for procedure day (afternoon VV) (PPE).      Interventional Therapies  Risk  Complexity Considerations:   Advanced age   Planned  Pending:   Diagnostic left L5 transverse process-sacral ala pseudoarthrosis injection.  If he ends up getting good relief of the pain, at least for the duration of the numbing medicine, she may be a good candidate for corrective surgery of the affected area.   Under consideration:   Diagnostic right lumbar facet block #1  Diagnostic right ischial bursa injection #1  Possible bilateral lumbar facet RFA  Possible bilateral sacroiliac joint RFA  Diagnostic left L4 TFESI  Diagnostic left IA hip joint injection   Diagnostic bilateral femoral nerve + obturator NB  Possible bilateral femoral nerve + obturator nerve RFA    Completed:   Therapeutic left L4-5 LESI x6 (04/10/2020)  Therapeutic right L4-5 LESI x1 (07/21/2017)  Palliative right L5-S1 LESI x1 (09/27/2015)  Therapeutic left L5-S1 LESI x1 (02/05/2016)  Diagnostic right SI joint injection x2 (08/25/2019)  Diagnostic left SI joint injection x2 (01/27/2019)  Diagnostic left glenohumeral joint injection  x1 (04/29/2017)  Left biceps muscle trigger point injection x1 (07/21/2017)  Right PSIS trigger point injection x1 (07/27/2018)  Diagnostic right lumbar facet block x1 (10/23/2020)  Diagnostic left lumbar facet block x2 (10/23/2020)  Diagnostic left IA hip injection x1 (01/27/2019)    Palliative options:   None at this  time     Recent Visits Date Type Provider Dept  05/22/21 Office Visit Delano Metz, MD [facility]  03/28/21 Telemedicine Delano Metz, MD [facility]  03/12/21 Procedure visit Delano Metz, MD [facility]  Showing recent visits within past 90 days and meeting all other requirements Today's Visits Date Type Provider Dept  06/04/21 Procedure visit Delano Metz, MD [facility]  Showing today's visits and meeting all other requirements Future Appointments Date Type Provider Dept  06/19/21 Appointment Delano Metz, MD [facility]  08/14/21 Appointment Delano Metz, MD [facility]  Showing future appointments within next 90 days and meeting all other requirements Disposition: Discharge home  Discharge (Date  Time): 06/04/2021; 1150 hrs.   Primary Care Physician: Jerl Mina, MD Location: [facility] Note by: Oswaldo Done, MD Date: 06/04/2021; Time: 5:51 PM  Disclaimer:  Medicine is not an Visual merchandiser. The only guarantee in medicine is that nothing is guaranteed. It is important to note that the decision to proceed with this intervention was based on the information collected from the patient. The Data and conclusions were drawn from the patient's questionnaire, the interview, and the physical examination. Because the information was provided in large part by the patient, it cannot be guaranteed that it has not been purposely or unconsciously manipulated. Every effort has been made to obtain as much relevant data as possible for this evaluation. It is important to note that the conclusions that lead to this procedure are derived in large part from the available data. Always take into account that the treatment will also be dependent on availability of resources and existing treatment guidelines, considered by other Pain Management Practitioners as being common  knowledge and practice, at the time of the intervention. For Medico-Legal purposes, it is also important to point out that variation in procedural techniques and pharmacological choices are the acceptable norm. The indications, contraindications, technique, and results of the above procedure should only be interpreted and judged by a Board-Certified Interventional Pain Specialist with extensive familiarity and expertise in the same exact procedure and technique.

## 2021-06-04 ENCOUNTER — Ambulatory Visit (HOSPITAL_BASED_OUTPATIENT_CLINIC_OR_DEPARTMENT_OTHER): Payer: PPO | Admitting: Pain Medicine

## 2021-06-04 ENCOUNTER — Encounter: Payer: Self-pay | Admitting: Pain Medicine

## 2021-06-04 ENCOUNTER — Other Ambulatory Visit: Payer: Self-pay

## 2021-06-04 ENCOUNTER — Ambulatory Visit
Admission: RE | Admit: 2021-06-04 | Discharge: 2021-06-04 | Disposition: A | Discharge status: Home / Self Care | Payer: PPO | Source: Ambulatory Visit | Attending: Pain Medicine | Admitting: Pain Medicine

## 2021-06-04 VITALS — BP 134/72 | HR 82 | Temp 96.7°F | Resp 18 | Ht 62.0 in | Wt 145.0 lb

## 2021-06-04 DIAGNOSIS — M48062 Spinal stenosis, lumbar region with neurogenic claudication: Secondary | ICD-10-CM | POA: Insufficient documentation

## 2021-06-04 DIAGNOSIS — M545 Low back pain, unspecified: Secondary | ICD-10-CM

## 2021-06-04 DIAGNOSIS — G8929 Other chronic pain: Secondary | ICD-10-CM | POA: Insufficient documentation

## 2021-06-04 DIAGNOSIS — M79605 Pain in left leg: Secondary | ICD-10-CM | POA: Diagnosis not present

## 2021-06-04 DIAGNOSIS — M4316 Spondylolisthesis, lumbar region: Secondary | ICD-10-CM

## 2021-06-04 DIAGNOSIS — M5136 Other intervertebral disc degeneration, lumbar region: Secondary | ICD-10-CM | POA: Insufficient documentation

## 2021-06-04 DIAGNOSIS — M5416 Radiculopathy, lumbar region: Secondary | ICD-10-CM | POA: Insufficient documentation

## 2021-06-04 MED ORDER — ROPIVACAINE HCL 2 MG/ML IJ SOLN
2.0000 mL | Freq: Once | INTRAMUSCULAR | Status: AC
  Administered 2021-06-04: 2 mL via EPIDURAL

## 2021-06-04 MED ORDER — ROPIVACAINE HCL 2 MG/ML IJ SOLN
INTRAMUSCULAR | Status: AC
  Filled 2021-06-04: qty 20

## 2021-06-04 MED ORDER — SODIUM CHLORIDE 0.9% FLUSH
2.0000 mL | Freq: Once | INTRAVENOUS | Status: AC
  Administered 2021-06-04: 2 mL

## 2021-06-04 MED ORDER — SODIUM CHLORIDE (PF) 0.9 % IJ SOLN
INTRAMUSCULAR | Status: AC
  Filled 2021-06-04: qty 10

## 2021-06-04 MED ORDER — IOHEXOL 180 MG/ML  SOLN
10.0000 mL | Freq: Once | INTRAMUSCULAR | Status: AC
Start: 2021-06-04 — End: 2021-06-04
  Administered 2021-06-04: 10 mL via EPIDURAL

## 2021-06-04 MED ORDER — TRIAMCINOLONE ACETONIDE 40 MG/ML IJ SUSP
INTRAMUSCULAR | Status: AC
  Filled 2021-06-04: qty 1

## 2021-06-04 MED ORDER — TRIAMCINOLONE ACETONIDE 40 MG/ML IJ SUSP
40.0000 mg | Freq: Once | INTRAMUSCULAR | Status: AC
  Administered 2021-06-04: 40 mg

## 2021-06-04 MED ORDER — LIDOCAINE HCL 2 % IJ SOLN
20.0000 mL | Freq: Once | INTRAMUSCULAR | Status: AC
Start: 2021-06-04 — End: 2021-06-04
  Administered 2021-06-04: 200 mg

## 2021-06-04 MED ORDER — LIDOCAINE HCL 2 % IJ SOLN
INTRAMUSCULAR | Status: AC
  Filled 2021-06-04: qty 10

## 2021-06-04 NOTE — Patient Instructions (Signed)

## 2021-06-05 ENCOUNTER — Telehealth: Payer: Self-pay

## 2021-06-05 NOTE — Telephone Encounter (Signed)
Called PP.Denies any needs at this time"I took a short walk this morning". Denies any needs instructed to call if needed.

## 2021-06-17 NOTE — Progress Notes (Signed)
Patient: Cynthia Bennett  Service Category: E/M  Provider: Gaspar Cola, MD  DOB: 12/22/1932  DOS: 06/19/2021  Location: Office  MRN: [id number]  Setting: Ambulatory outpatient  Referring Provider: [name], MD  Type: Established Patient  Specialty: Interventional Pain Management  PCP: [name], MD  Location: Remote location  Delivery: TeleHealth     Virtual Encounter - Pain Management PROVIDER NOTE: Information contained herein reflects review and annotations entered in association with encounter. Interpretation of such information and data should be left to medically-trained personnel. Information provided to patient can be located elsewhere in the medical record under "Patient Instructions". Document created using STT-dictation technology, any transcriptional errors that may result from process are unintentional.    Contact & Pharmacy Preferred: [place]: [phone] (home) Mobile: [phone] (mobile) E-mail: [name]  [place], [place] [address] Phone: [phone] Fax: [phone]   Pre-screening  Cynthia Bennett offered "in-person" vs "virtual" encounter. She indicated preferring virtual for this encounter.   Reason COVID-19*  Social distancing based on CDC and AMA recommendations.   I contacted Cynthia Bennett on 06/19/2021 via telephone.      I clearly identified myself as Gaspar Cola, MD. I verified that I was speaking with the correct person using two identifiers (Name: Cynthia Bennett, and date of birth: 1933/06/15).  Consent I sought verbal advanced consent from Cynthia Bennett for virtual visit interactions. I informed Cynthia Bennett of possible security and privacy concerns, risks, and limitations associated with providing "not-in-person" medical evaluation and management services. I also informed Cynthia Bennett of the availability of "in-person" appointments. Finally, I informed her that there would be a  charge for the virtual visit and that she could be  personally, fully or partially, financially responsible for it. Cynthia Bennett expressed understanding and agreed to proceed.   Historic Elements   Cynthia Bennett is a 87 y.o. year old, female patient evaluated today after our last contact on 06/04/2021. Cynthia Bennett  has a past medical history of Dizzy spells, Fibromyalgia, Glaucoma, Hiatal hernia, Osteoarthritis, Osteoarthritis of spine with radiculopathy, lumbar region (08/14/2015), Peptic ulcer disease, Reflux, Spinal stenosis, Stroke ([place]), and TIA (transient ischemic attack). She also  has a past surgical history that includes Esophagogastroduodenoscopy (N/A, 04/16/2015); Appendectomy; and Abdominal hysterectomy. Cynthia Bennett has a current medication list which includes the following prescription(s): acetaminophen, bupropion, vitamin d3, cyanocobalamin, doxepin, esomeprazole, multivitamin, ondansetron, oxycodone, [START ON 06/21/2021] oxycodone, [START ON 07/21/2021] oxycodone, pantoprazole, sertraline, and zolpidem. She  reports that she has quit smoking. She has never used smokeless tobacco. She reports that she does not drink alcohol and does not use drugs. Cynthia Bennett is allergic to ibuprofen.   HPI  Today, she is being contacted for a post-procedure assessment.  The patient indicates that the procedure has eliminated all of her low back pain.  However, she still has the numbness in the left lower extremity and she refers that it is beginning to increase more.  Today I have talked to the patient I have explained the reason why this is happening on the fact that there may still be some swelling left in the area.  Because of this I will be entering an order to repeat this procedure before that numbness moves onto pain and weakness.  The plan was explained to the patient who understood and accepted.  Post-Procedure Evaluation  Procedure (06/04/2021):  Type: Therapeutic Inter-Laminar Epidural Steroid Injection  Region: Lumbar Level: L4-5 Level. Laterality: Left      Pre-procedure pain level: 8/10 Post-procedure: 0/10 (100% relief)  Anxiolysis: none.  Effectiveness during initial hour after procedure (Ultra-Short Term Relief): 100 %.  Local anesthetic used: Long-acting (4-6 hours) Effectiveness: Defined as any analgesic benefit obtained secondary to the administration of local anesthetics. This carries significant diagnostic value as to the etiological location, or anatomical origin, of the pain. Duration of benefit is expected to coincide with the duration of the local anesthetic used.  Effectiveness during initial 4-6 hours after procedure (Short-Term Relief): 100 %.  Long-term benefit: Defined as any relief past the pharmacologic duration of the local anesthetics.  Effectiveness past the initial 6 hours after procedure (Long-Term Relief): 100 % (ongoing).  Benefits, current: Defined as benefit present at the time of this evaluation.   Analgesia:   The patient indicates currently enjoying an ongoing 100% relief of her low back pain. Function: Cynthia Bennett reports improvement in function ROM: Cynthia Bennett reports improvement in ROM  Pharmacotherapy Assessment   Analgesic: Oxycodone IR 5 mg, 1 tab PO 3 times daily (maximum of 15 mg/day of oxycodone.) MME/day:  22.5 mg/day.   Monitoring: [place] PMP: PDMP reviewed during this encounter.       Pharmacotherapy: No side-effects or adverse reactions reported. Compliance: No problems identified. Effectiveness: Clinically acceptable. Plan: Refer to "POC". UDS:  Summary  Date Value Ref Range Status  05/22/2021 Note  Final    Comment:    ==================================================================== [employer] [facility] (MW) ==================================================================== Test                             Result       Flag       Units  Drug Present and Declared for Prescription Verification   Oxycodone                      120           EXPECTED   ng/mg creat   Noroxycodone                   1698         EXPECTED   ng/mg creat    Sources of oxycodone include scheduled prescription medications.    Noroxycodone is an expected metabolite of oxycodone.  ==================================================================== Test                      Result    Flag   Units      Ref Range   Creatinine              56               mg/dL      >=20 ==================================================================== Declared Medications:  The flagging and interpretation on this report are based on the  following declared medications.  Unexpected results may arise from  inaccuracies in the declared medications.   **Note: The testing scope of this panel includes these medications:   Oxycodone   **Note: The testing scope of this panel does not include the  following reported medications:   Acetaminophen (Tylenol)  Bupropion (Wellbutrin SR)  Cholecalciferol  Doxepin  Esomeprazole (Nexium)  Multivitamin  Ondansetron  Pantoprazole (Protonix)  Sertraline (Zoloft)  Vitamin B12  Zolpidem (Ambien) ==================================================================== For clinical consultation, please call [phone]. ====================================================================      Laboratory Chemistry Profile   Renal Lab  Results  Component Value Date   BUN 14 05/20/2021   CREATININE 1.03 (H) 05/20/2021   BCR 11 (L) 01/10/2019   GFRAA 46 (L) 01/10/2019   GFRNONAA 53 (L) 05/20/2021    Hepatic Lab Results  Component Value Date   AST 18 01/10/2019   ALT 11 (L) 06/24/2016   ALBUMIN 4.3 01/10/2019   ALKPHOS 74 01/10/2019   LIPASE 139 04/27/2014    Electrolytes Lab Results  Component Value Date   NA 135 05/20/2021   K 4.2 05/20/2021   CL 104 05/20/2021   CALCIUM 9.2 05/20/2021   MG 2.3 01/10/2019    Bone Lab Results  Component Value Date   25OHVITD1 26 (L) 01/10/2019   25OHVITD2 1.6  01/10/2019   25OHVITD3 24 01/10/2019    Inflammation (CRP: Acute Phase) (ESR: Chronic Phase) Lab Results  Component Value Date   CRP 0.9 08/24/2020   ESRSEDRATE 51 (H) 01/10/2019         Note: Above Lab results reviewed.  Imaging  DG PAIN CLINIC C-ARM 1-60 MIN NO REPORT Fluoro was used, but no Radiologist interpretation will be provided.  Please refer to "NOTES" tab for provider progress note.  Assessment  The primary encounter diagnosis was Chronic low back pain (1ry area of Pain) (Bilateral) (L>R) w/o sciatica. Diagnoses of Chronic lower extremity pain (2ry area of Pain) (Left), Chronic lumbar radicular pain (Left) (L5 Dermatome), DDD (degenerative disc disease), lumbar, Grade 1 spondylolisthesis of L4 over L5, Lumbar radicular pain (Bilateral) (R>L), Severe (5-6 mm) L4-5 lumbar spinal stenosis and (8 mm) L3-4 spinal stenosis., and DDD (degenerative disc disease), thoracolumbar were also pertinent to this visit.  Plan of Care  Problem-specific:  No problem-specific Assessment & Plan notes found for this encounter.  Cynthia Bennett has a current medication list which includes the following long-term medication(s): bupropion, doxepin, oxycodone, [START ON 06/21/2021] oxycodone, [START ON 07/21/2021] oxycodone, pantoprazole, and zolpidem.  Pharmacotherapy (Medications Ordered): No orders of the defined types were placed in this encounter.  Orders:  Orders Placed This Encounter  Procedures   Lumbar Epidural Injection    Standing Status:   Future    Standing Expiration Date:   07/20/2021    Scheduling Instructions:     Procedure: Interlaminar Lumbar Epidural Steroid injection (LESI)  L4-5     Laterality: Left-sided     Sedation: None required.     Timeframe: 2 weeks from now    Order Specific Question:   Where will this procedure be performed?    Answer:   [facility]   Follow-up plan:   Return in about 2 weeks (around 07/03/2021) for (no sedation) procedure: (L) L4-5  LESI #2.     Interventional Therapies  Risk  Complexity Considerations:   Advanced age   Planned  Pending:   Diagnostic left L5 transverse process-sacral ala pseudoarthrosis injection.  If he ends up getting good relief of the pain, at least for the duration of the numbing medicine, she may be a good candidate for corrective surgery of the affected area.   Under consideration:   Diagnostic right lumbar facet block #1  Diagnostic right ischial bursa injection #1  Possible bilateral lumbar facet RFA  Possible bilateral sacroiliac joint RFA  Diagnostic left L4 TFESI  Diagnostic left IA hip joint injection   Diagnostic bilateral femoral nerve + obturator NB  Possible bilateral femoral nerve + obturator nerve RFA    Completed:   Therapeutic left L4-5 LESI x6 (04/10/2020)  Therapeutic right  L4-5 LESI x1 (07/21/2017)  Palliative right L5-S1 LESI x1 (09/27/2015)  Therapeutic left L5-S1 LESI x1 (02/05/2016)  Diagnostic right SI joint injection x2 (08/25/2019)  Diagnostic left SI joint injection x2 (01/27/2019)  Diagnostic left glenohumeral joint injection x1 (04/29/2017)  Left biceps muscle trigger point injection x1 (07/21/2017)  Right PSIS trigger point injection x1 (07/27/2018)  Diagnostic right lumbar facet block x1 (10/23/2020)  Diagnostic left lumbar facet block x2 (10/23/2020)  Diagnostic left IA hip injection x1 (01/27/2019)    Palliative options:   None at this time      Recent Visits Date Type Provider Dept  06/04/21 Procedure visit Milinda Pointer, MD [facility]  05/22/21 Office Visit Milinda Pointer, MD [facility]  03/28/21 Telemedicine Milinda Pointer, MD [facility]  Showing recent visits within past 90 days and meeting all other requirements Today's Visits Date Type Provider Dept  06/19/21 Telemedicine Milinda Pointer, MD [facility]  Showing today's visits and meeting all other requirements Future Appointments Date  Type Provider Dept  08/14/21 Appointment Milinda Pointer, MD [facility]  Showing future appointments within next 90 days and meeting all other requirements I discussed the assessment and treatment plan with the patient. The patient was provided an opportunity to ask questions and all were answered. The patient agreed with the plan and demonstrated an understanding of the instructions.  Patient advised to call back or seek an in-person evaluation if the symptoms or condition worsens.  Duration of encounter: 12 minutes.  Note by: Gaspar Cola, MD Date: 06/19/2021; Time: 4:35 PM

## 2021-06-19 ENCOUNTER — Other Ambulatory Visit: Payer: Self-pay

## 2021-06-19 ENCOUNTER — Ambulatory Visit: Payer: PPO | Attending: Pain Medicine | Admitting: Pain Medicine

## 2021-06-19 DIAGNOSIS — G8929 Other chronic pain: Secondary | ICD-10-CM | POA: Diagnosis not present

## 2021-06-19 DIAGNOSIS — M5136 Other intervertebral disc degeneration, lumbar region: Secondary | ICD-10-CM | POA: Diagnosis not present

## 2021-06-19 DIAGNOSIS — M4316 Spondylolisthesis, lumbar region: Secondary | ICD-10-CM | POA: Diagnosis not present

## 2021-06-19 DIAGNOSIS — M545 Low back pain, unspecified: Secondary | ICD-10-CM | POA: Diagnosis not present

## 2021-06-19 DIAGNOSIS — M5135 Other intervertebral disc degeneration, thoracolumbar region: Secondary | ICD-10-CM | POA: Diagnosis not present

## 2021-06-19 DIAGNOSIS — M48062 Spinal stenosis, lumbar region with neurogenic claudication: Secondary | ICD-10-CM

## 2021-06-19 DIAGNOSIS — M5416 Radiculopathy, lumbar region: Secondary | ICD-10-CM

## 2021-06-19 DIAGNOSIS — M79605 Pain in left leg: Secondary | ICD-10-CM | POA: Diagnosis not present

## 2021-06-20 ENCOUNTER — Telehealth: Payer: Self-pay

## 2021-06-20 NOTE — Telephone Encounter (Signed)
Ok to schedule for procedure when approved.  No sedation, no blood thinners.

## 2021-06-23 NOTE — Progress Notes (Signed)
PROVIDER NOTE: Information contained herein reflects review and annotations entered in association with encounter. Interpretation of such information and data should be left to medically-trained personnel. Information provided to patient can be located elsewhere in the medical record under "Patient Instructions". Document created using STT-dictation technology, any transcriptional errors that may result from process are unintentional.    Patient: Cynthia Bennett  Service Category: Procedure  Provider: Oswaldo DoneFrancisco A Ege Muckey, MD  DOB: [birth year]  DOS: 06/25/2021  Location: [facility]  MRN: [id number]  Setting: Ambulatory - outpatient  Referring Provider: Jerl MinaHedrick, James, MD  Type: Established Patient  Specialty: Interventional Pain Management  PCP: Jerl MinaHedrick, James, MD   Primary Reason for Visit: Interventional Pain Management Treatment. CC: Foot Pain (left)  Procedure:          Anesthesia, Analgesia, Anxiolysis:  Type: Therapeutic Inter-Laminar Epidural Steroid Injection           Region: Lumbar Level: L4-5 Level. Laterality: Left         Type: Local Anesthesia Indication(s): Analgesia         Route: Infiltration ([place]/IM) IV Access: Declined Sedation: Declined  Local Anesthetic: Lidocaine 1-2%  Position: Prone with head of the table was raised to facilitate breathing.   Indications: 1. Chronic lumbar radicular pain (Left) (L5 Dermatome)   2. DDD (degenerative disc disease), lumbar   3. Grade 1 spondylolisthesis of L4 over L5   4. Lumbar radicular pain (Bilateral) (R>L)   5. Lumbar discogenic pain syndrome   6. Severe (5-6 mm) L4-5 lumbar spinal stenosis and (8 mm) L3-4 spinal stenosis.   7. Chronic lower extremity pain (2ry area of Pain) (Left)   8. Abnormal MRI, lumbar spine (06/25/2016)    Pain Score: Pre-procedure: 8 /10 Post-procedure: 0-No pain/10   Pre-op H&P Assessment:  [name] is a 87 y.o. (year old), female patient, seen today for interventional treatment. She   has a past surgical history that includes Esophagogastroduodenoscopy (N/A, 04/16/2015); Appendectomy; and Abdominal hysterectomy. [name] has a current medication list which includes the following prescription(s): acetaminophen, bupropion, vitamin d3, cyanocobalamin, doxepin, esomeprazole, multivitamin, ondansetron, oxycodone, [START ON 07/21/2021] oxycodone, pantoprazole, sertraline, zolpidem, and oxycodone. Her primarily concern today is the Foot Pain (left)  Initial Vital Signs:  Pulse/HCG Rate: 82  Temp: (!) 96.7 F (35.9 C) Resp: (!) 24 BP: (!) 126/57 SpO2: 100 %  BMI: Estimated body mass index is 28.17 kg/m as calculated from the following:   Height as of this encounter: 5\' 2"  (1.575 m).   Weight as of this encounter: 154 lb (69.9 kg).  Risk Assessment: Allergies: Reviewed. She is allergic to ibuprofen.  Allergy Precautions: None required Coagulopathies: Reviewed. None identified.  Blood-thinner therapy: None at this time Active Infection(s): Reviewed. None identified. [name] is afebrile  Site Confirmation: [name] was asked to confirm the procedure and laterality before marking the site Procedure checklist: Completed Consent: Before the procedure and under the influence of no sedative(s), amnesic(s), or anxiolytics, the patient was informed of the treatment options, risks and possible complications. To fulfill our ethical and legal obligations, as recommended by the American Medical Association's Code of Ethics, I have informed the patient of my clinical impression; the nature and purpose of the treatment or procedure; the risks, benefits, and possible complications of the intervention; the alternatives, including doing nothing; the risk(s) and benefit(s) of the alternative treatment(s) or procedure(s); and the risk(s) and benefit(s) of doing nothing. The patient was provided information about the general risks and possible  complications associated with the procedure. These may  include, but are not limited to: failure to achieve desired goals, infection, bleeding, organ or nerve damage, allergic reactions, paralysis, and death. In addition, the patient was informed of those risks and complications associated to Spine-related procedures, such as failure to decrease pain; infection (i.e.: Meningitis, epidural or intraspinal abscess); bleeding (i.e.: epidural hematoma, subarachnoid hemorrhage, or any other type of intraspinal or peri-dural bleeding); organ or nerve damage (i.e.: Any type of peripheral nerve, nerve root, or spinal cord injury) with subsequent damage to sensory, motor, and/or autonomic systems, resulting in permanent pain, numbness, and/or weakness of one or several areas of the body; allergic reactions; (i.e.: anaphylactic reaction); and/or death. Furthermore, the patient was informed of those risks and complications associated with the medications. These include, but are not limited to: allergic reactions (i.e.: anaphylactic or anaphylactoid reaction(s)); adrenal axis suppression; blood sugar elevation that in diabetics may result in ketoacidosis or comma; water retention that in patients with history of congestive heart failure may result in shortness of breath, pulmonary edema, and decompensation with resultant heart failure; weight gain; swelling or edema; medication-induced neural toxicity; particulate matter embolism and blood vessel occlusion with resultant organ, and/or nervous system infarction; and/or aseptic necrosis of one or more joints. Finally, the patient was informed that Medicine is not an exact science; therefore, there is also the possibility of unforeseen or unpredictable risks and/or possible complications that may result in a catastrophic outcome. The patient indicated having understood very clearly. We have given the patient no guarantees and we have made no promises. Enough time was given to the patient to ask questions, all of which were answered  to the patient's satisfaction. Ms. Mossbarger has indicated that she wanted to continue with the procedure. Attestation: I, the ordering provider, attest that I have discussed with the patient the benefits, risks, side-effects, alternatives, likelihood of achieving goals, and potential problems during recovery for the procedure that I have provided informed consent. Date  Time: 06/25/2021  2:47 PM  Pre-Procedure Preparation:  Monitoring: As per clinic protocol. Respiration, ETCO2, SpO2, BP, heart rate and rhythm monitor placed and checked for adequate function Safety Precautions: Patient was assessed for positional comfort and pressure points before starting the procedure. Time-out: I initiated and conducted the "Time-out" before starting the procedure, as per protocol. The patient was asked to participate by confirming the accuracy of the "Time Out" information. Verification of the correct person, site, and procedure were performed and confirmed by me, the nursing staff, and the patient. "Time-out" conducted as per Joint Commission's Universal Protocol (UP.01.01.01). Time: 1458  Description of Procedure:          Target Area: The interlaminar space, initially targeting the lower laminar border of the superior vertebral body. Approach: Paramedial approach. Area Prepped: Entire Posterior Lumbar Region DuraPrep (Iodine Povacrylex [0.7% available iodine] and Isopropyl Alcohol, 74% w/w) Safety Precautions: Aspiration looking for blood return was conducted prior to all injections. At no point did we inject any substances, as a needle was being advanced. No attempts were made at seeking any paresthesias. Safe injection practices and needle disposal techniques used. Medications properly checked for expiration dates. SDV (single dose vial) medications used. Description of the Procedure: Protocol guidelines were followed. The procedure needle was introduced through the skin, ipsilateral to the reported pain, and  advanced to the target area. Bone was contacted and the needle walked caudad, until the lamina was cleared. The epidural space was identified using "loss-of-resistance technique" with 2-3  ml of PF-NaCl (0.9% NSS), in a 5cc LOR glass syringe.  Vitals:   06/25/21 1443 06/25/21 1452 06/25/21 1457 06/25/21 1507  BP: (!) 126/57 121/67 133/65 131/65  Pulse: 82 82 81 80  Resp:  (!) 24 (!) 22 16  Temp: (!) 96.7 F (35.9 C)     SpO2: 100% 98% 100% 97%  Weight: 154 lb (69.9 kg)     Height:  (1.575 m)       Start Time: 1458 hrs. End Time: 1503 hrs.  Materials:  Needle(s) Type: Epidural needle Gauge: 17G Length: 3.5-in Medication(s): Please see orders for medications and dosing details.  Imaging Guidance (Spinal):          Type of Imaging Technique: Fluoroscopy Guidance (Spinal) Indication(s): Assistance in needle guidance and placement for procedures requiring needle placement in or near specific anatomical locations not easily accessible without such assistance. Exposure Time: Please see nurses notes. Contrast: Before injecting any contrast, we confirmed that the patient did not have an allergy to iodine, shellfish, or radiological contrast. Once satisfactory needle placement was completed at the desired level, radiological contrast was injected. Contrast injected under live fluoroscopy. No contrast complications. See chart for type and volume of contrast used. Fluoroscopic Guidance: I was personally present during the use of fluoroscopy. "Tunnel Vision Technique" used to obtain the best possible view of the target area. Parallax error corrected before commencing the procedure. "Direction-depth-direction" technique used to introduce the needle under continuous pulsed fluoroscopy. Once target was reached, antero-posterior, oblique, and lateral fluoroscopic projection used confirm needle placement in all planes. Images permanently stored in EMR. Interpretation: I personally interpreted the  imaging intraoperatively. Adequate needle placement confirmed in multiple planes. Appropriate spread of contrast into desired area was observed. No evidence of afferent or efferent intravascular uptake. No intrathecal or subarachnoid spread observed. Permanent images saved into the patient's record.  Antibiotic Prophylaxis:   Anti-infectives (From admission, onward)    None      Indication(s): None identified  Post-operative Assessment:  Post-procedure Vital Signs:  Pulse/HCG Rate: 80  Temp: (!) 96.7 F (35.9 C) Resp: 16 BP: 131/65 SpO2: 97 %  EBL: None  Complications: No immediate post-treatment complications observed by team, or reported by patient.  Note: The patient tolerated the entire procedure well. A repeat set of vitals were taken after the procedure and the patient was kept under observation following institutional policy, for this type of procedure. Post-procedural neurological assessment was performed, showing return to baseline, prior to discharge. The patient was provided with post-procedure discharge instructions, including a section on how to identify potential problems. Should any problems arise concerning this procedure, the patient was given instructions to immediately contact us, at any time, without hesitation. In any case, we plan to contact the patient by telephone for a follow-up status report regarding this interventional procedure.  Comments:  No additional relevant information.  Plan of Care  Orders:  Orders Placed This Encounter  Procedures   Lumbar Epidural Injection    Scheduling Instructions:     Procedure: Interlaminar LESI L4-5     Laterality: Left-sided     Sedation: No Sedation     Timeframe:  Today    Order Specific Question:   Where will this procedure be performed?    Answer:   [facility]   DG PAIN [facility] C-ARM 1-60 MIN NO REPORT    Intraoperative interpretation by procedural physician at [facility].    Standing  Status:   Standing  Number of Occurrences:   1    Order Specific Question:   Reason for exam:    Answer:   Assistance in needle guidance and placement for procedures requiring needle placement in or near specific anatomical locations not easily accessible without such assistance.   Informed Consent Details: Physician/Practitioner Attestation; Transcribe to consent form and obtain patient signature    Note: Always confirm laterality of pain with Ms. Mccormack, before procedure. Transcribe to consent form and obtain patient signature.    Order Specific Question:   Physician/Practitioner attestation of informed consent for procedure/surgical case    Answer:   I, the physician/practitioner, attest that I have discussed with the patient the benefits, risks, side effects, alternatives, likelihood of achieving goals and potential problems during recovery for the procedure that I have provided informed consent.    Order Specific Question:   Procedure    Answer:   Lumbar epidural steroid injection under fluoroscopic guidance    Order Specific Question:   Physician/Practitioner performing the procedure    Answer:   Oletha Tolson A. Laban Emperor, MD    Order Specific Question:   Indication/Reason    Answer:   Low back and/or lower extremity pain secondary to lumbar radiculitis   Provide equipment / supplies at bedside    "Epidural Tray" (Disposable  single use) Catheter: NOT required    Standing Status:   Standing    Number of Occurrences:   1    Order Specific Question:   Specify    Answer:   Epidural Tray    Chronic Opioid Analgesic:  Oxycodone IR 5 mg, 1 tab PO 3 times daily (maximum of 15 mg/day of oxycodone.) MME/day:  22.5 mg/day.   Medications ordered for procedure: Meds ordered this encounter  Medications   iohexol (OMNIPAQUE) 180 MG/ML injection 10 mL    Must be Myelogram-compatible. If not available, you may substitute with a water-soluble, non-ionic, hypoallergenic, myelogram-compatible  radiological contrast medium.   lidocaine (XYLOCAINE) 2 % (with pres) injection 400 mg   sodium chloride flush (NS) 0.9 % injection 2 mL   ropivacaine (PF) 2 mg/mL (0.2%) (NAROPIN) injection 2 mL   triamcinolone acetonide (KENALOG-40) injection 40 mg    Medications administered: We administered iohexol, lidocaine, sodium chloride flush, ropivacaine (PF) 2 mg/mL (0.2%), and triamcinolone acetonide.  See the medical record for exact dosing, route, and time of administration.  Follow-up plan:   Return in about 2 weeks (around 07/09/2021) for Proc day (T,Th), (VV), (PPE).       Interventional Therapies  Risk  Complexity Considerations:   Advanced age   Planned  Pending:   Diagnostic left L5 transverse process-sacral ala pseudoarthrosis injection.  If he ends up getting good relief of the pain, at least for the duration of the numbing medicine, she may be a good candidate for corrective surgery of the affected area.   Under consideration:   Diagnostic right lumbar facet block #1  Diagnostic right ischial bursa injection #1  Possible bilateral lumbar facet RFA  Possible bilateral sacroiliac joint RFA  Diagnostic left L4 TFESI  Diagnostic left IA hip joint injection   Diagnostic bilateral femoral nerve + obturator NB  Possible bilateral femoral nerve + obturator nerve RFA    Completed:   Therapeutic left L4-5 LESI x6 (04/10/2020)  Therapeutic right L4-5 LESI x1 (07/21/2017)  Palliative right L5-S1 LESI x1 (09/27/2015)  Therapeutic left L5-S1 LESI x1 (02/05/2016)  Diagnostic right SI joint injection x2 (08/25/2019)  Diagnostic left SI joint injection x2 (01/27/2019)  Diagnostic left glenohumeral joint injection x1 (04/29/2017)  Left biceps muscle trigger point injection x1 (07/21/2017)  Right PSIS trigger point injection x1 (07/27/2018)  Diagnostic right lumbar facet block x1 (10/23/2020)  Diagnostic left lumbar facet block x2 (10/23/2020)  Diagnostic left IA hip injection x1 (01/27/2019)     Palliative options:   None at this time       Recent Visits Date Type Provider Dept  06/19/21 Telemedicine Delano Metz, MD [facility]  06/04/21 Procedure visit Delano Metz, MD [facility]  05/22/21 Office Visit Delano Metz, MD [facility]  03/28/21 Telemedicine Delano Metz, MD [facility]  Showing recent visits within past 90 days and meeting all other requirements Today's Visits Date Type Provider Dept  06/25/21 Procedure visit Delano Metz, MD [facility]  Showing today's visits and meeting all other requirements Future Appointments Date Type Provider Dept  07/30/21 Appointment Delano Metz, MD [facility]  08/14/21 Appointment Delano Metz, MD [facility]  Showing future appointments within next 90 days and meeting all other requirements Disposition: Discharge home  Discharge (Date  Time): 06/25/2021;   hrs.   Primary Care Physician: Jerl Mina, MD Location: [facility] Note by: Oswaldo Done, MD Date: 06/25/2021; Time: 4:14 PM  Disclaimer:  Medicine is not an Visual merchandiser. The only guarantee in medicine is that nothing is guaranteed. It is important to note that the decision to proceed with this intervention was based on the information collected from the patient. The Data and conclusions were drawn from the patient's questionnaire, the interview, and the physical examination. Because the information was provided in large part by the patient, it cannot be guaranteed that it has not been purposely or unconsciously manipulated. Every effort has been made to obtain as much relevant data as possible for this evaluation. It is important to note that the conclusions that lead to this procedure are derived in large part from the available data. Always take into account that the treatment will also be dependent on availability of  resources and existing treatment guidelines, considered by other Pain Management Practitioners as being common knowledge and practice, at the time of the intervention. For Medico-Legal purposes, it is also important to point out that variation in procedural techniques and pharmacological choices are the acceptable norm. The indications, contraindications, technique, and results of the above procedure should only be interpreted and judged by a Board-Certified Interventional Pain Specialist with extensive familiarity and expertise in the same exact procedure and technique.

## 2021-06-25 ENCOUNTER — Encounter: Payer: Self-pay | Admitting: Pain Medicine

## 2021-06-25 ENCOUNTER — Ambulatory Visit
Admission: RE | Admit: 2021-06-25 | Discharge: 2021-06-25 | Disposition: A | Discharge status: Home / Self Care | Payer: PPO | Source: Ambulatory Visit | Attending: Pain Medicine | Admitting: Pain Medicine

## 2021-06-25 ENCOUNTER — Other Ambulatory Visit: Payer: Self-pay

## 2021-06-25 ENCOUNTER — Ambulatory Visit (HOSPITAL_BASED_OUTPATIENT_CLINIC_OR_DEPARTMENT_OTHER): Payer: PPO | Admitting: Pain Medicine

## 2021-06-25 VITALS — BP 131/65 | HR 80 | Temp 96.7°F | Resp 16 | Ht 62.0 in | Wt 154.0 lb

## 2021-06-25 DIAGNOSIS — M5416 Radiculopathy, lumbar region: Secondary | ICD-10-CM

## 2021-06-25 DIAGNOSIS — R937 Abnormal findings on diagnostic imaging of other parts of musculoskeletal system: Secondary | ICD-10-CM | POA: Insufficient documentation

## 2021-06-25 DIAGNOSIS — M48062 Spinal stenosis, lumbar region with neurogenic claudication: Secondary | ICD-10-CM | POA: Diagnosis not present

## 2021-06-25 DIAGNOSIS — M5136 Other intervertebral disc degeneration, lumbar region: Secondary | ICD-10-CM

## 2021-06-25 DIAGNOSIS — G8929 Other chronic pain: Secondary | ICD-10-CM | POA: Insufficient documentation

## 2021-06-25 DIAGNOSIS — M4316 Spondylolisthesis, lumbar region: Secondary | ICD-10-CM | POA: Diagnosis not present

## 2021-06-25 DIAGNOSIS — M5126 Other intervertebral disc displacement, lumbar region: Secondary | ICD-10-CM | POA: Insufficient documentation

## 2021-06-25 DIAGNOSIS — M79605 Pain in left leg: Secondary | ICD-10-CM

## 2021-06-25 MED ORDER — SODIUM CHLORIDE 0.9% FLUSH
2.0000 mL | Freq: Once | INTRAVENOUS | Status: AC
  Administered 2021-06-25: 2 mL

## 2021-06-25 MED ORDER — TRIAMCINOLONE ACETONIDE 40 MG/ML IJ SUSP
40.0000 mg | Freq: Once | INTRAMUSCULAR | Status: AC
  Administered 2021-06-25: 40 mg

## 2021-06-25 MED ORDER — SODIUM CHLORIDE (PF) 0.9 % IJ SOLN
INTRAMUSCULAR | Status: AC
  Filled 2021-06-25: qty 10

## 2021-06-25 MED ORDER — LIDOCAINE HCL (PF) 2 % IJ SOLN
INTRAMUSCULAR | Status: AC
  Filled 2021-06-25: qty 10

## 2021-06-25 MED ORDER — IOHEXOL 180 MG/ML  SOLN
10.0000 mL | Freq: Once | INTRAMUSCULAR | Status: AC
  Administered 2021-06-25: 10 mL via EPIDURAL

## 2021-06-25 MED ORDER — ROPIVACAINE HCL 2 MG/ML IJ SOLN
INTRAMUSCULAR | Status: AC
  Filled 2021-06-25: qty 20

## 2021-06-25 MED ORDER — TRIAMCINOLONE ACETONIDE 40 MG/ML IJ SUSP
INTRAMUSCULAR | Status: AC
  Filled 2021-06-25: qty 1

## 2021-06-25 MED ORDER — LIDOCAINE HCL 2 % IJ SOLN
20.0000 mL | Freq: Once | INTRAMUSCULAR | Status: AC
Start: 2021-06-25 — End: 2021-06-25
  Administered 2021-06-25: 200 mg

## 2021-06-25 MED ORDER — ROPIVACAINE HCL 2 MG/ML IJ SOLN
2.0000 mL | Freq: Once | INTRAMUSCULAR | Status: AC
  Administered 2021-06-25: 2 mL via EPIDURAL

## 2021-06-25 NOTE — Patient Instructions (Signed)
____________________________________________________________________________________________  Virtual Visits   What is a "Virtual Visit"? It is a healthcare communication encounter (medical visit) that takes place on real time (NOT TEXT or E-MAIL) over the telephone or computer device (desktop, laptop, tablet, smart phone, etc.). It allows for more location flexibility between the patient and the healthcare provider.  Who decides when these types of visits will be used? The physician.  Who is eligible for these types of visits? Only those patients that can be reliably reached over the telephone.  What do you mean by reliably? We do not have time to call everyone multiple times, therefore those that tend to screen calls and then call back later are not suitable candidates for this system. We understand how people are reluctant to pickup on "unknown" calls, therefore, we suggest adding our telephone numbers to your list of "CONTACT(s)". This way, you should be able to readily identify our calls when you receive one. All of our numbers are available below.   Who is not eligible? This option is not available for medication management encounters, specially for controlled substances. Patients on pain medications that fall under the category of controlled substances have to come in for "Face-to-Face" encounters. This is required for mandatory monitoring of these substances. You may be asked to provide a sample for an unannounced urine drug screening test (UDS), and we will need to count your pain pills. Not bringing your pills to be counted may result in no refill. Obviously, neither one of these can be done over the phone.  When will this type of visits be used? You can request a virtual visit whenever you are physically unable to attend a regular appointment. The decision will be made by the physician (or healthcare provider) on a case by case basis.   At what time will I be called? This is an  excellent question. The providers will try to call you whenever they have time available. Do not expect to be called at any specific time. The secretaries will assign you a time for your virtual visit appointment, but this is done simply to keep a list of those patients that need to be called, but not for the purpose of keeping a time schedule. Be advised that the call may come in anytime during the day, between the hours of 8:00 AM and 8::00 PM, depending on provider availability. We do understand that the system is not perfect. If you are unable to be available that day on a moments notice, then request an "in-person" appointment rather than a "virtual visit".  Can I request my medication visits to be "Virtual"? Yes you may request it, but the decision is entirely up to the healthcare provider. Control substances require specific monitoring that requires Face-to-Face encounters. The number of encounters  and the extent of the monitoring is determined on a case by case basis.  Add a new contact to your smart phone and label it "PAIN CLINIC" Under this contact add the following numbers: Main: [phone] (Official Contact Number) Nurses: [phone] (These are outgoing only calling systems. Do not call this number.) Dr. Giavanni Zeitlin: [phone] or [phone] (Outgoing calls only. Do not call this number.)  ____________________________________________________________________________________________   ____________________________________________________________________________________________  Post-Procedure Discharge Instructions  Instructions: Apply ice:  Purpose: This will minimize any swelling and discomfort after procedure.  When: Day of procedure, as soon as you get home. How: Fill a plastic sandwich bag with crushed ice. Cover it with a small towel and apply to   injection site. How long: (15 min on, 15 min off) Apply for 15 minutes then remove x 15 minutes.  Repeat sequence on day  of procedure, until you go to bed. Apply heat:  Purpose: To treat any soreness and discomfort from the procedure. When: Starting the next day after the procedure. How: Apply heat to procedure site starting the day following the procedure. How long: May continue to repeat daily, until discomfort goes away. Food intake: Start with clear liquids (like water) and advance to regular food, as tolerated.  Physical activities: Keep activities to a minimum for the first 8 hours after the procedure. After that, then as tolerated. Driving: If you have received any sedation, be responsible and do not drive. You are not allowed to drive for 24 hours after having sedation. Blood thinner: (Applies only to those taking blood thinners) You may restart your blood thinner 6 hours after your procedure. Insulin: (Applies only to Diabetic patients taking insulin) As soon as you can eat, you may resume your normal dosing schedule. Infection prevention: Keep procedure site clean and dry. Shower daily and clean area with soap and water. Post-procedure Pain Diary: Extremely important that this be done correctly and accurately. Recorded information will be used to determine the next step in treatment. For the purpose of accuracy, follow these rules: Evaluate only the area treated. Do not report or include pain from an untreated area. For the purpose of this evaluation, ignore all other areas of pain, except for the treated area. After your procedure, avoid taking a long nap and attempting to complete the pain diary after you wake up. Instead, set your alarm clock to go off every hour, on the hour, for the initial 8 hours after the procedure. Document the duration of the numbing medicine, and the relief you are getting from it. Do not go to sleep and attempt to complete it later. It will not be accurate. If you received sedation, it is likely that you were given a medication that may cause amnesia. Because of this, completing  the diary at a later time may cause the information to be inaccurate. This information is needed to plan your care. Follow-up appointment: Keep your post-procedure follow-up evaluation appointment after the procedure (usually 2 weeks for most procedures, 6 weeks for radiofrequencies). DO NOT FORGET to bring you pain diary with you.   Expect: (What should I expect to see with my procedure?) From numbing medicine (AKA: Local Anesthetics): Numbness or decrease in pain. You may also experience some weakness, which if present, could last for the duration of the local anesthetic. Onset: Full effect within 15 minutes of injected. Duration: It will depend on the type of local anesthetic used. On the average, 1 to 8 hours.  From steroids (Applies only if steroids were used): Decrease in swelling or inflammation. Once inflammation is improved, relief of the pain will follow. Onset of benefits: Depends on the amount of swelling present. The more swelling, the longer it will take for the benefits to be seen. In some cases, up to 10 days. Duration: Steroids will stay in the system x 2 weeks. Duration of benefits will depend on multiple posibilities including persistent irritating factors. Side-effects: If present, they may typically last 2 weeks (the duration of the steroids). Frequent: Cramps (if they occur, drink Gatorade and take over-the-counter Magnesium 450-500 mg once to twice a day); water retention with temporary weight gain; increases in blood sugar; decreased immune system response; increased appetite. Occasional: Facial flushing (red,   warm cheeks); mood swings; menstrual changes. Uncommon: Long-term decrease or suppression of natural hormones; bone thinning. (These are more common with higher doses or more frequent use. This is why we prefer that our patients avoid having any injection therapies in other practices.)  Very Rare: Severe mood changes; psychosis; aseptic necrosis. From procedure: Some  discomfort is to be expected once the numbing medicine wears off. This should be minimal if ice and heat are applied as instructed.  Call if: (When should I call?) You experience numbness and weakness that gets worse with time, as opposed to wearing off. New onset bowel or bladder incontinence. (Applies only to procedures done in the spine)  Emergency Numbers: Durning business hours (Monday - Thursday, 8:00 AM - 4:00 PM) (Friday, 9:00 AM - 12:00 Noon): [phone] After hours: [phone] NOTE: If you are having a problem and are unable connect with, or to talk to a provider, then go to your nearest urgent care or emergency department. If the problem is serious and urgent, please call 911. ____________________________________________________________________________________________   

## 2021-06-25 NOTE — Progress Notes (Signed)
Safety precautions to be maintained throughout the outpatient stay will include: orient to surroundings, keep bed in low position, maintain call bell within reach at all times, provide assistance with transfer out of bed and ambulation.  

## 2021-06-26 ENCOUNTER — Telehealth: Payer: Self-pay

## 2021-06-26 NOTE — Telephone Encounter (Signed)
Post procedure phone call. Patient states she is doing good.  

## 2021-07-02 DIAGNOSIS — H18831 Recurrent erosion of cornea, right eye: Secondary | ICD-10-CM | POA: Diagnosis not present

## 2021-07-04 DIAGNOSIS — H18831 Recurrent erosion of cornea, right eye: Secondary | ICD-10-CM | POA: Diagnosis not present

## 2021-07-25 ENCOUNTER — Other Ambulatory Visit: Payer: Self-pay | Admitting: Internal Medicine

## 2021-07-26 ENCOUNTER — Encounter: Payer: Self-pay | Admitting: Internal Medicine

## 2021-07-27 NOTE — Progress Notes (Signed)
Unsuccessful attempt to contact patient for Virtual Visit (Pain Management Telehealth)   Patient provided contact information:  [phone] (home); [phone] (mobile); (Preferred) [phone] [name]   Pre-screening:  Our staff was successful in contacting Cynthia Bennett using the above provided information.   I unsuccessfully attempted to make contact with Cynthia Bennett on 07/30/2021 via telephone. I was unable to complete the virtual encounter due to no one answering the phone. I was unable to leave a message.  The information below was gathered from the information collected by the nursing staff during the preencounter assessment.  Post-Procedure Evaluation  Procedure (06/25/2021):  Procedure:           Anesthesia, Analgesia, Anxiolysis:  Type: Therapeutic Inter-Laminar Epidural Steroid Injection           Region: Lumbar Level: L4-5 Level. Laterality: Left          Type: Local Anesthesia Indication(s): Analgesia         Route: Infiltration (Virgie/IM) IV Access: Declined Sedation: Declined  Local Anesthetic: Lidocaine 1-2%   Position: Prone with head of the table was raised to facilitate breathing.    Indications: 1. Chronic lumbar radicular pain (Left) (L5 Dermatome)   2. DDD (degenerative disc disease), lumbar   3. Grade 1 spondylolisthesis of L4 over L5   4. Lumbar radicular pain (Bilateral) (R>L)   5. Lumbar discogenic pain syndrome   6. Severe (5-6 mm) L4-5 lumbar spinal stenosis and (8 mm) L3-4 spinal stenosis.   7. Chronic lower extremity pain (2ry area of Pain) (Left)   8. Abnormal MRI, lumbar spine (06/25/2016)     Pain Score: Pre-procedure: 8 /10 Post-procedure: 0-No pain/10   Anxiolysis: none.  Effectiveness during initial hour after procedure (Ultra-Short Term Relief): 100 %.  Local anesthetic used: Long-acting (4-6 hours) Effectiveness: Defined as any analgesic benefit obtained secondary to the administration of local anesthetics. This carries  significant diagnostic value as to the etiological location, or anatomical origin, of the pain. Duration of benefit is expected to coincide with the duration of the local anesthetic used.  Effectiveness during initial 4-6 hours after procedure (Short-Term Relief): 100 %.  Long-term benefit: Defined as any relief past the pharmacologic duration of the local anesthetics.  Effectiveness past the initial 6 hours after procedure (Long-Term Relief): 80 %.   Pharmacotherapy Assessment  Analgesic: Oxycodone IR 5 mg, 1 tab PO 3 times daily (maximum of 15 mg/day of oxycodone.) MME/day:  22.5 mg/day.   Follow-up plan:   Reschedule Visit.    Interventional Therapies  Risk  Complexity Considerations:   Advanced age   Planned  Pending:   Diagnostic left L5 transverse process-sacral ala pseudoarthrosis injection.  If he ends up getting good relief of the pain, at least for the duration of the numbing medicine, she may be a good candidate for corrective surgery of the affected area.   Under consideration:   Diagnostic right lumbar facet block #1  Diagnostic right ischial bursa injection #1  Possible bilateral lumbar facet RFA  Possible bilateral sacroiliac joint RFA  Diagnostic left L4 TFESI  Diagnostic left IA hip joint injection   Diagnostic bilateral femoral nerve + obturator NB  Possible bilateral femoral nerve + obturator nerve RFA    Completed:   Therapeutic left L4-5 LESI x6 (04/10/2020)  Therapeutic right L4-5 LESI x1 (07/21/2017)  Palliative right L5-S1 LESI x1 (09/27/2015)  Therapeutic left L5-S1 LESI x1 (02/05/2016)  Diagnostic right SI joint injection x2 (08/25/2019)  Diagnostic left SI joint injection x2 (01/27/2019)  Diagnostic left glenohumeral joint injection x1 (04/29/2017)  Left biceps muscle trigger point injection x1 (07/21/2017)  Right PSIS trigger point injection x1 (07/27/2018)  Diagnostic right lumbar facet block x1 (10/23/2020)  Diagnostic left lumbar facet block x2  (10/23/2020)  Diagnostic left IA hip injection x1 (01/27/2019)    Palliative options:   None at this time        Recent Visits Date Type Provider Dept  06/25/21 Procedure visit Delano Metz, MD [facility]  06/19/21 Telemedicine Delano Metz, MD [facility]  06/04/21 Procedure visit Delano Metz, MD [facility]  05/22/21 Office Visit Delano Metz, MD [facility]  Showing recent visits within past 90 days and meeting all other requirements Today's Visits Date Type Provider Dept  07/30/21 Telemedicine Delano Metz, MD [facility]  Showing today's visits and meeting all other requirements Future Appointments Date Type Provider Dept  08/14/21 Appointment Delano Metz, MD [facility]  Showing future appointments within next 90 days and meeting all other requirements  Note by: Oswaldo Done, MD Date: 07/30/2021; Time: 4:52 PM

## 2021-07-29 ENCOUNTER — Encounter: Payer: Self-pay | Admitting: Pain Medicine

## 2021-07-30 ENCOUNTER — Ambulatory Visit: Payer: PPO | Attending: Pain Medicine | Admitting: Pain Medicine

## 2021-07-30 ENCOUNTER — Other Ambulatory Visit: Payer: Self-pay

## 2021-07-30 DIAGNOSIS — M5416 Radiculopathy, lumbar region: Secondary | ICD-10-CM

## 2021-07-30 DIAGNOSIS — M48062 Spinal stenosis, lumbar region with neurogenic claudication: Secondary | ICD-10-CM

## 2021-07-30 DIAGNOSIS — M79605 Pain in left leg: Secondary | ICD-10-CM

## 2021-07-30 DIAGNOSIS — G8929 Other chronic pain: Secondary | ICD-10-CM

## 2021-07-30 DIAGNOSIS — M545 Low back pain, unspecified: Secondary | ICD-10-CM

## 2021-08-12 ENCOUNTER — Other Ambulatory Visit: Payer: Self-pay | Admitting: *Deleted

## 2021-08-12 ENCOUNTER — Telehealth: Payer: Self-pay | Admitting: *Deleted

## 2021-08-12 DIAGNOSIS — N1832 Chronic kidney disease, stage 3b: Secondary | ICD-10-CM

## 2021-08-12 DIAGNOSIS — D631 Anemia in chronic kidney disease: Secondary | ICD-10-CM

## 2021-08-12 DIAGNOSIS — E611 Iron deficiency: Secondary | ICD-10-CM

## 2021-08-12 NOTE — Telephone Encounter (Signed)
Colette- Dr. B would like the [facility] NP to see patient this week for lab/ NP/SMC/ possible venofer- what days are available and I can reach out to the patient.

## 2021-08-12 NOTE — Telephone Encounter (Signed)
Patient called stating that she feels her HGB has "bottomed out" and wants her 09/18/21 appointment moved up to asap. Please advise

## 2021-08-12 NOTE — Telephone Encounter (Signed)
Disregard that message I see he pt NP

## 2021-08-12 NOTE — Telephone Encounter (Signed)
Dr. Leonard Schwartz only has one slot at 9 for tomorrow that is all her has unless a NP sees her

## 2021-08-12 NOTE — Telephone Encounter (Signed)
Spoke with patient. Pt accepted the apt with Josh, NP on Thursday for possible IV iron. Pt expressed gratitude for calling her back.

## 2021-08-14 ENCOUNTER — Encounter: Payer: PPO | Admitting: Pain Medicine

## 2021-08-14 NOTE — Progress Notes (Deleted)
No-show to procedure  

## 2021-08-15 ENCOUNTER — Inpatient Hospital Stay: Payer: PPO

## 2021-08-15 ENCOUNTER — Inpatient Hospital Stay: Payer: PPO | Attending: Hospice and Palliative Medicine

## 2021-08-15 ENCOUNTER — Other Ambulatory Visit: Payer: Self-pay

## 2021-08-15 ENCOUNTER — Inpatient Hospital Stay (HOSPITAL_BASED_OUTPATIENT_CLINIC_OR_DEPARTMENT_OTHER): Payer: PPO | Admitting: Hospice and Palliative Medicine

## 2021-08-15 VITALS — BP 146/87 | HR 95 | Temp 98.6°F | Resp 18

## 2021-08-15 VITALS — BP 114/59 | HR 71 | Resp 16

## 2021-08-15 DIAGNOSIS — R5383 Other fatigue: Secondary | ICD-10-CM

## 2021-08-15 DIAGNOSIS — B349 Viral infection, unspecified: Secondary | ICD-10-CM | POA: Insufficient documentation

## 2021-08-15 DIAGNOSIS — R531 Weakness: Secondary | ICD-10-CM

## 2021-08-15 DIAGNOSIS — N189 Chronic kidney disease, unspecified: Secondary | ICD-10-CM | POA: Diagnosis not present

## 2021-08-15 DIAGNOSIS — Z79899 Other long term (current) drug therapy: Secondary | ICD-10-CM | POA: Diagnosis not present

## 2021-08-15 DIAGNOSIS — N1832 Chronic kidney disease, stage 3b: Secondary | ICD-10-CM

## 2021-08-15 DIAGNOSIS — E611 Iron deficiency: Secondary | ICD-10-CM | POA: Insufficient documentation

## 2021-08-15 DIAGNOSIS — Z9049 Acquired absence of other specified parts of digestive tract: Secondary | ICD-10-CM | POA: Insufficient documentation

## 2021-08-15 DIAGNOSIS — Z8673 Personal history of transient ischemic attack (TIA), and cerebral infarction without residual deficits: Secondary | ICD-10-CM | POA: Diagnosis not present

## 2021-08-15 DIAGNOSIS — D631 Anemia in chronic kidney disease: Secondary | ICD-10-CM | POA: Insufficient documentation

## 2021-08-15 DIAGNOSIS — Z886 Allergy status to analgesic agent status: Secondary | ICD-10-CM | POA: Insufficient documentation

## 2021-08-15 LAB — CBC WITH DIFFERENTIAL/PLATELET
Abs Immature Granulocytes: 0.04 10*3/uL (ref 0.00–0.07)
Basophils Absolute: 0.1 10*3/uL (ref 0.0–0.1)
Basophils Relative: 1 %
Eosinophils Absolute: 0.3 10*3/uL (ref 0.0–0.5)
Eosinophils Relative: 4 %
HCT: 39.2 % (ref 36.0–46.0)
Hemoglobin: 11.8 g/dL — ABNORMAL LOW (ref 12.0–15.0)
Immature Granulocytes: 1 %
Lymphocytes Relative: 28 %
Lymphs Abs: 2.2 10*3/uL (ref 0.7–4.0)
MCH: 25.4 pg — ABNORMAL LOW (ref 26.0–34.0)
MCHC: 30.1 g/dL (ref 30.0–36.0)
MCV: 84.3 fL (ref 80.0–100.0)
Monocytes Absolute: 0.7 10*3/uL (ref 0.1–1.0)
Monocytes Relative: 9 %
Neutro Abs: 4.7 10*3/uL (ref 1.7–7.7)
Neutrophils Relative %: 57 %
Platelets: 425 10*3/uL — ABNORMAL HIGH (ref 150–400)
RBC: 4.65 MIL/uL (ref 3.87–5.11)
RDW: 15 % (ref 11.5–15.5)
WBC: 8.2 10*3/uL (ref 4.0–10.5)
nRBC: 0 % (ref 0.0–0.2)

## 2021-08-15 LAB — BASIC METABOLIC PANEL
Anion gap: 8 (ref 5–15)
BUN: 17 mg/dL (ref 8–23)
CO2: 24 mmol/L (ref 22–32)
Calcium: 9.1 mg/dL (ref 8.9–10.3)
Chloride: 104 mmol/L (ref 98–111)
Creatinine, Ser: 1.19 mg/dL — ABNORMAL HIGH (ref 0.44–1.00)
GFR, Estimated: 45 mL/min — ABNORMAL LOW (ref >60)
Glucose, Bld: 109 mg/dL — ABNORMAL HIGH (ref 70–99)
Potassium: 4 mmol/L (ref 3.5–5.1)
Sodium: 136 mmol/L (ref 135–145)

## 2021-08-15 LAB — IRON AND TIBC
Iron: 49 ug/dL (ref 28–170)
Saturation Ratios: 15 % (ref 10.4–31.8)
TIBC: 337 ug/dL (ref 250–450)
UIBC: 288 ug/dL

## 2021-08-15 LAB — FERRITIN: Ferritin: 20 ng/mL (ref 11–307)

## 2021-08-15 MED ORDER — ONDANSETRON HCL 8 MG PO TABS: ORAL_TABLET | ORAL | 1 refills | Status: AC

## 2021-08-15 MED ORDER — SODIUM CHLORIDE 0.9 % IV SOLN: 200.0000 mg | Freq: Once | INTRAVENOUS | Status: DC

## 2021-08-15 MED ORDER — IRON SUCROSE 20 MG/ML IV SOLN
200.0000 mg | Freq: Once | INTRAVENOUS | Status: AC
  Administered 2021-08-15: 200 mg via INTRAVENOUS
  Filled 2021-08-15: qty 10

## 2021-08-15 MED ORDER — SODIUM CHLORIDE 0.9 % IV SOLN
Freq: Once | INTRAVENOUS | Status: AC
  Filled 2021-08-15: qty 250

## 2021-08-15 NOTE — Patient Instructions (Signed)
[facility] [place] [facility]   Discharge Instructions: Thank you for choosing [facility] to provide your oncology and hematology care.  If you have a lab appointment with the Cancer Center, please go directly to the Cancer Center and check in at the registration area.  We strive to give you quality time with your provider. You may need to reschedule your appointment if you arrive late (15 or more minutes).  Arriving late affects you and other patients whose appointments are after yours.  Also, if you miss three or more appointments without notifying the office, you may be dismissed from the clinic at the provider's discretion.      For prescription refill requests, have your pharmacy contact our office and allow 72 hours for refills to be completed.    Today you received the following: Venofer.      To help prevent nausea and vomiting after your treatment, we encourage you to take your nausea medication as directed.  BELOW ARE SYMPTOMS THAT SHOULD BE REPORTED IMMEDIATELY: *FEVER GREATER THAN 100.4 F (38 C) OR HIGHER *CHILLS OR SWEATING *NAUSEA AND VOMITING THAT IS NOT CONTROLLED WITH YOUR NAUSEA MEDICATION *UNUSUAL SHORTNESS OF BREATH *UNUSUAL BRUISING OR BLEEDING *URINARY PROBLEMS (pain or burning when urinating, or frequent urination) *BOWEL PROBLEMS (unusual diarrhea, constipation, pain near the anus) TENDERNESS IN MOUTH AND THROAT WITH OR WITHOUT PRESENCE OF ULCERS (sore throat, sores in mouth, or a toothache) UNUSUAL RASH, SWELLING OR PAIN  UNUSUAL VAGINAL DISCHARGE OR ITCHING   Items with * indicate a potential emergency and should be followed up as soon as possible or go to the Emergency Department if any problems should occur.  Should you have questions after your visit or need to cancel or reschedule your appointment, please contact [facility] [place] [facility]  [phone] and follow the prompts.  Office hours are 8:00 a.m.  to 4:30 p.m. Monday - Friday. Please note that voicemails left after 4:00 p.m. may not be returned until the following business day.  We are closed weekends and major holidays. You have access to a nurse at all times for urgent questions. Please call the main number to the clinic [phone] and follow the prompts.  For any non-urgent questions, you may also contact your provider using MyChart. We now offer e-Visits for anyone 18 and older to request care online for non-urgent symptoms. For details visit [email or web address].   Also download the MyChart app! Go to the app store, search "MyChart", open the app, select [place], and log in with your MyChart username and password.  Due to Covid, a mask is required upon entering the hospital/clinic. If you do not have a mask, one will be given to you upon arrival. For doctor visits, patients may have 1 support person aged 18 or older with them. For treatment visits, patients cannot have anyone with them due to current Covid guidelines and our immunocompromised population.  

## 2021-08-15 NOTE — Progress Notes (Signed)
Pt reports that she feels weak and tired. She believes that her "iron levels" are low. She reports that she had a fall 1 month ago, but other than bruising, did not sustain any significant injuries.

## 2021-08-15 NOTE — Progress Notes (Signed)
Symptom Management Clinic [facility]  Telephone:([phone] [phone]  Patient Care Team: Jerl Mina, MD as PCP - General (Family Medicine) Earna Coder, MD as Consulting Physician (Hematology and Oncology)   Name of the patient: Cynthia Bennett  [id number]  1933-10-17   Date of visit: 08/15/21  Reason for Consult:  Ms. Prescilla Monger is an 87 year old woman with multiple medical problems including anemia of chronic disease (CKD) and iron deficiency anemia who was previously been managed on Venofer.  Patient last saw Dr. Donneta Romberg on 05/20/2021 for iron deficiency anemia and received IV Venofer.  Plan was for 40-month follow-up for MD visit and labs.  Patient presents to [facility] today for evaluation of fatigue and weakness.  She reports having a fall about a month ago but did not sustain fracture or significant injury.  Patient reached for the bathroom door handle in the middle the night and missed it and subsequently fell.  She denies any falls since but says that she has remained somewhat weak.  She also had subsequent viral illness and feels like she has not quite bounced back from them.  Patient denies fever or chills.  No GI or GU symptoms today.  She says that she has had reduced appetite over the past month and weight loss but does not quantify specifically how much.  Patient denies any hematochezia or melena.  Denies any neurologic complaints. Denies recent fevers or illnesses. Denies any easy bleeding or bruising.  Denies chest pain.  Patient offers no further specific complaints today.  PAST MEDICAL HISTORY: Past Medical History:  Diagnosis Date   Dizzy spells    Fibromyalgia    Glaucoma    Hiatal hernia    Osteoarthritis    Osteoarthritis of spine with radiculopathy, lumbar region 08/14/2015   Peptic ulcer disease    Reflux    Spinal stenosis    Stroke (HCC)    TIA (transient ischemic attack)     PAST SURGICAL HISTORY:  Past  Surgical History:  Procedure Laterality Date   ABDOMINAL HYSTERECTOMY     APPENDECTOMY     ESOPHAGOGASTRODUODENOSCOPY N/A 04/16/2015   Procedure: ESOPHAGOGASTRODUODENOSCOPY (EGD);  Surgeon: Wallace Cullens, MD;  Location: [facility];  Service: Gastroenterology;  Laterality: N/A;    HEMATOLOGY/ONCOLOGY HISTORY:  Oncology History   No history exists.    ALLERGIES:  is allergic to ibuprofen.  MEDICATIONS:  Current Outpatient Medications  Medication Sig Dispense Refill   acetaminophen (TYLENOL) 325 MG tablet Take 650 mg by mouth every 6 (six) hours as needed.     buPROPion (WELLBUTRIN SR) 150 MG 12 hr tablet TAKE 1 TABLET BY MOUTH ONCE DAILY     Cholecalciferol (VITAMIN D3) 20 MCG (800 UNIT) TABS Take by mouth 2 (two) times daily.     cyanocobalamin (,VITAMIN B-12,) 1000 MCG/ML injection Inject 1,000 mcg into the muscle every 30 (thirty) days.     doxepin (SINEQUAN) 100 MG capsule Take 100 mg by mouth at bedtime.     esomeprazole (NEXIUM) 40 MG capsule Take 40 mg by mouth 2 (two) times daily.     Multiple Vitamin (MULTIVITAMIN) tablet Take 1 tablet by mouth daily.     ondansetron (ZOFRAN) 8 MG tablet TAKE 1 TABLET BY MOUTH EVERY 8 HOURS AS NEEDED FOR NAUSEA AND VOMITING 40 tablet 1   oxyCODONE (OXY IR/ROXICODONE) 5 MG immediate release tablet Take 1 tablet (5 mg total) by mouth every 8 (eight) hours as needed for severe pain. Must last 30  days 90 tablet 0   pantoprazole (PROTONIX) 40 MG tablet Take 1 tablet by mouth 2 (two) times daily.  11   sertraline (ZOLOFT) 100 MG tablet Take 1 tablet by mouth daily. (Patient not taking: Reported on 07/29/2021)     zolpidem (AMBIEN) 10 MG tablet TAKE 1/2 TO 1 TABLET BY MOUTH EVERY NIGHT AT BEDTIME     No current facility-administered medications for this visit.    VITAL SIGNS: There were no vitals taken for this visit. There were no vitals filed for this visit.  Estimated body mass index is 28.17 kg/m as calculated from the following:   Height as  of 06/25/21: 5\' 2"  (1.575 m).   Weight as of 06/25/21: 154 lb (69.9 kg).  LABS: CBC:    Component Value Date/Time   WBC 8.2 08/15/2021 1018   HGB 11.8 (L) 08/15/2021 1018   HGB 10.0 (L) 04/28/2014 0541   HCT 39.2 08/15/2021 1018   HCT 30.3 (L) 04/28/2014 0541   PLT 425 (H) 08/15/2021 1018   PLT 257 04/28/2014 0541   MCV 84.3 08/15/2021 1018   MCV 85 04/28/2014 0541   NEUTROABS 4.7 08/15/2021 1018   NEUTROABS 4.9 04/28/2014 0541   LYMPHSABS 2.2 08/15/2021 1018   LYMPHSABS 2.9 04/28/2014 0541   MONOABS 0.7 08/15/2021 1018   MONOABS 0.5 04/28/2014 0541   EOSABS 0.3 08/15/2021 1018   EOSABS 0.4 04/28/2014 0541   BASOSABS 0.1 08/15/2021 1018   BASOSABS 0.1 04/28/2014 0541   Comprehensive Metabolic Panel:    Component Value Date/Time   NA 136 08/15/2021 1018   NA 141 01/10/2019 1143   NA 143 04/28/2014 0541   K 4.0 08/15/2021 1018   K 4.0 04/28/2014 0541   CL 104 08/15/2021 1018   CL 115 (H) 04/28/2014 0541   CO2 24 08/15/2021 1018   CO2 23 04/28/2014 0541   BUN 17 08/15/2021 1018   BUN 14 01/10/2019 1143   BUN 21 (H) 04/28/2014 0541   CREATININE 1.19 (H) 08/15/2021 1018   CREATININE 0.89 04/28/2014 0541   GLUCOSE 109 (H) 08/15/2021 1018   GLUCOSE 113 (H) 04/28/2014 0541   CALCIUM 9.1 08/15/2021 1018   CALCIUM 8.0 (L) 04/28/2014 0541   AST 18 01/10/2019 1143   AST 22 04/27/2014 0238   ALT 11 (L) 06/24/2016 1122   ALT 12 04/27/2014 0238   ALKPHOS 74 01/10/2019 1143   ALKPHOS 92 04/27/2014 0238   BILITOT 0.3 01/10/2019 1143   BILITOT 0.4 04/27/2014 0238   PROT 7.1 01/10/2019 1143   PROT 7.1 04/27/2014 0238   ALBUMIN 4.3 01/10/2019 1143   ALBUMIN 3.2 (L) 04/27/2014 0238    RADIOGRAPHIC STUDIES: No results found.  PERFORMANCE STATUS (ECOG) : 2 - Symptomatic, <50% confined to bed  Review of Systems Unless otherwise noted, a complete review of systems is negative.  Physical Exam General: NAD Cardiovascular: regular rate and rhythm Pulmonary: clear  anterior/posterior fields Abdomen: soft, nontender, + bowel sounds GU: no suprapubic tenderness Extremities: no edema, no joint deformities Skin: no rashes Neurological: Weakness but otherwise nonfocal  Assessment and Plan- Patient is a 87 y.o. female with multiple medical problems including CKD, AOCD, and IDA who presents to clinic for evaluation of fatigue/weakness.  Fatigue/Weakness -likely multifactorial in setting of recent fall/viral illness.  Patient is also slightly anemic.  Hemoglobin 11.8 today down from 13.1 two months ago.  Iron studies pending but discussed with Dr. 88 and we will proceed with Venofer today.  Add TSH to labs.  We will also send referral for home health PT.   Case and plan discussed with Dr. Donneta Romberg.  Patient to RTC on 09/18/2021 to see Dr. Donneta Romberg as previously scheduled or sooner if needed.  Patient expressed understanding and was in agreement with this plan. She also understands that She can call clinic at any time with any questions, concerns, or complaints.   Thank you for allowing me to participate in the care of this very pleasant patient.   Time Total: 20 minutes  Visit consisted of counseling and education dealing with the complex and emotionally intense issues of symptom management and palliative care in the setting of serious and potentially life-threatening illness.Greater than 50%  of this time was spent counseling and coordinating care related to the above assessment and plan.  Signed by: Laurette Schimke, PhD, NP-C

## 2021-08-15 NOTE — Addendum Note (Signed)
Addended by: Laurette Schimke R on: 08/15/2021 12:03 PM   Modules accepted: Orders

## 2021-08-16 ENCOUNTER — Telehealth: Payer: Self-pay

## 2021-08-16 NOTE — Telephone Encounter (Signed)
Message left with [facility] re: new referral for HHPT.

## 2021-08-19 ENCOUNTER — Telehealth: Payer: Self-pay | Admitting: *Deleted

## 2021-08-19 NOTE — Telephone Encounter (Signed)
Colette- please arrange for referral with [facility].  Patient's Home Health PT is not able to service her at this time per [facility]. Josh asked that the patient see Marisue Humble

## 2021-08-22 ENCOUNTER — Ambulatory Visit: Payer: PPO | Attending: Pain Medicine | Admitting: Pain Medicine

## 2021-08-22 ENCOUNTER — Other Ambulatory Visit: Payer: Self-pay

## 2021-08-22 DIAGNOSIS — R202 Paresthesia of skin: Secondary | ICD-10-CM

## 2021-08-22 DIAGNOSIS — M48062 Spinal stenosis, lumbar region with neurogenic claudication: Secondary | ICD-10-CM

## 2021-08-22 DIAGNOSIS — M4316 Spondylolisthesis, lumbar region: Secondary | ICD-10-CM | POA: Diagnosis not present

## 2021-08-22 DIAGNOSIS — M4155 Other secondary scoliosis, thoracolumbar region: Secondary | ICD-10-CM

## 2021-08-22 DIAGNOSIS — R200 Anesthesia of skin: Secondary | ICD-10-CM | POA: Diagnosis not present

## 2021-08-22 DIAGNOSIS — M5416 Radiculopathy, lumbar region: Secondary | ICD-10-CM | POA: Diagnosis not present

## 2021-08-22 DIAGNOSIS — G8929 Other chronic pain: Secondary | ICD-10-CM | POA: Diagnosis not present

## 2021-08-22 DIAGNOSIS — Z9181 History of falling: Secondary | ICD-10-CM

## 2021-08-22 DIAGNOSIS — R29898 Other symptoms and signs involving the musculoskeletal system: Secondary | ICD-10-CM | POA: Diagnosis not present

## 2021-08-22 DIAGNOSIS — M545 Low back pain, unspecified: Secondary | ICD-10-CM

## 2021-08-22 DIAGNOSIS — R2689 Other abnormalities of gait and mobility: Secondary | ICD-10-CM | POA: Diagnosis not present

## 2021-08-22 DIAGNOSIS — R937 Abnormal findings on diagnostic imaging of other parts of musculoskeletal system: Secondary | ICD-10-CM

## 2021-08-22 DIAGNOSIS — M79605 Pain in left leg: Secondary | ICD-10-CM

## 2021-08-22 NOTE — Progress Notes (Signed)
Patient: Cynthia Bennett  Service Category: E/M  Provider: Gaspar Cola, MD  DOB: 1933/08/21  DOS: 08/22/2021  Location: Office  MRN: [id number]  Setting: Ambulatory outpatient  Referring Provider: [name], MD  Type: Established Patient  Specialty: Interventional Pain Management  PCP: [name], MD  Location: Remote location  Delivery: TeleHealth     Virtual Encounter - Pain Management PROVIDER NOTE: Information contained herein reflects review and annotations entered in association with encounter. Interpretation of such information and data should be left to medically-trained personnel. Information provided to patient can be located elsewhere in the medical record under "Patient Instructions". Document created using STT-dictation technology, any transcriptional errors that may result from process are unintentional.    Contact & Pharmacy Preferred: [facility]: [phone] (home) Mobile: [phone] (mobile) E-mail: [name]  [place], [place] [address] Phone: [phone] Fax: [phone]   Pre-screening  [name] offered "in-person" vs "virtual" encounter. She indicated preferring virtual for this encounter.   Reason COVID-19*  Social distancing based on CDC and AMA recommendations.   I contacted [name] on 08/22/2021 via telephone.      I clearly identified myself as Gaspar Cola, MD. I verified that I was speaking with the correct person using two identifiers (Name: Cynthia Bennett, and date of birth: 06/22/1933).  Consent I sought verbal advanced consent from [name] for virtual visit interactions. I informed [name] of possible security and privacy concerns, risks, and limitations associated with providing "not-in-person" medical evaluation and management services. I also informed [name] of the availability of "in-person" appointments. Finally, I informed her that there would be a  charge for the virtual visit and that she could be  personally, fully or partially, financially responsible for it. [name] expressed understanding and agreed to proceed.   Historic Elements   Cynthia Bennett is a 87 y.o. year old, female patient evaluated today after our last contact on 08/14/2021. Cynthia Bennett  has a past medical history of Dizzy spells, Fibromyalgia, Glaucoma, Hiatal hernia, Osteoarthritis, Osteoarthritis of spine with radiculopathy, lumbar region (08/14/2015), Peptic ulcer disease, Reflux, Spinal stenosis, Stroke ([place]), and TIA (transient ischemic attack). She also  has a past surgical history that includes Esophagogastroduodenoscopy (N/A, 04/16/2015); Appendectomy; and Abdominal hysterectomy. Cynthia Bennett has a current medication list which includes the following prescription(s): acetaminophen, bupropion, vitamin d3, cyanocobalamin, doxepin, ondansetron, oxycodone, pantoprazole, and zolpidem. She  reports that she has quit smoking. She has never used smokeless tobacco. She reports that she does not drink alcohol and does not use drugs. Cynthia Bennett is allergic to ibuprofen.   HPI  Today, she is being contacted for a post-procedure assessment.  The patient refers having attained 100% relief of the pain for the duration of the local anesthetic followed by a slow decrease to an 80% benefit once the local anesthetic wore off.  At this point, as long as she is not moving, she refers currently enjoying an ongoing 80 to 90% relief of her pain.  However, she recently experienced a fall in her bathroom and she did not go to emergency care.  She said that she was monitored by her children and because she did not develop any count or respiratory problems or any other complications, they did not take her to the emergency room.  She refers that she is having some difficulties with her balance, as well as some tingling and numbness  on her left leg.  At age 87, she refers not being interested in corrective surgery at  this time.  However, she refers that secondary to her recent fall, she may have to return for another interventional treatment since she was doing great up to the point where she fell.  At this point she is a high risk for those falls, even though she is using a walker.  I offered a referral to physical therapy for them to work on her balance and strength and she indicated that this may be a good idea.  She also refers needing to come in for pain medication management evaluation and to see if she needs to have another injection.  She refers that obturator that fall she has had to use her medication more regularly and she has been using at least 2 pills/day and occasionally she refers taking 3/day.  She also indicating having only 25 pills left meaning that at that rate she will be running out of medicine in approximately 8 days from today.  On 05/22/2021 I wrote 3 prescriptions for this patient.  According to my review of the PMP, she has had all 3 of them filled.  Today I entered a referral for physical therapy for them to work on her balance and strength and I have also entered a request to have the patient scheduled to come back to see me on a face-to-face medication management encounter before 08/29/2021.  When she comes then we will do her medication management but we will also check her back and left flank where she indicated having hit herself when she fell.  Today she indicates that she has not had any kind of breathing issues or problems after the fall and therefore she does not think that she broke any ribs.  Post-Procedure Evaluation  Procedure (06/25/2021):  Procedure:           Anesthesia, Analgesia, Anxiolysis:  Type: Therapeutic Inter-Laminar Epidural Steroid Injection           Region: Lumbar Level: L4-5 Level. Laterality: Left          Type: Local Anesthesia Indication(s): Analgesia         Route: Infiltration ([place]/IM) IV Access: Declined Sedation: Declined  Local Anesthetic: Lidocaine  1-2%   Position: Prone with head of the table was raised to facilitate breathing.    Indications: 1. Chronic lumbar radicular pain (Left) (L5 Dermatome)   2. DDD (degenerative disc disease), lumbar   3. Grade 1 spondylolisthesis of L4 over L5   4. Lumbar radicular pain (Bilateral) (R>L)   5. Lumbar discogenic pain syndrome   6. Severe (5-6 mm) L4-5 lumbar spinal stenosis and (8 mm) L3-4 spinal stenosis.   7. Chronic lower extremity pain (2ry area of Pain) (Left)   8. Abnormal MRI, lumbar spine (06/25/2016)     Pain Score: Pre-procedure: 8 /10 Post-procedure: 0-No pain/10    Anxiolysis: none.  Effectiveness during initial hour after procedure (Ultra-Short Term Relief): 100 %.   Local anesthetic used: Long-acting (4-6 hours) Effectiveness: Defined as any analgesic benefit obtained secondary to the administration of local anesthetics. This carries significant diagnostic value as to the etiological location, or anatomical origin, of the pain. Duration of benefit is expected to coincide with the duration of the local anesthetic used.  Effectiveness during initial 4-6 hours after procedure (Short-Term Relief): 100 %.   Long-term benefit: Defined as any relief past the pharmacologic duration of the local anesthetics.  Effectiveness past the initial 6  hours after procedure (Long-Term Relief): 80 %.  Benefits, current: Defined as benefit present at the time of this evaluation.   Analgesia: The patient indicates currently enjoying a 90% relief of her low back pain and leg pain, as long as she remains sitting and does not move around too much. Function: Ms. Lafrance reports improvement in function ROM: Ms. Humble reports improvement in ROM  Pharmacotherapy Assessment   Analgesic: Oxycodone IR 5 mg, 1 tab PO 3 times daily (maximum of 15 mg/day of oxycodone.) MME/day:  22.5 mg/day.   Monitoring:  PMP: PDMP reviewed during this encounter.       Pharmacotherapy: No side-effects or adverse  reactions reported. Compliance: No problems identified. Effectiveness: Clinically acceptable. Plan: Refer to "POC". UDS:  Summary  Date Value Ref Range Status  05/22/2021 Note  Final    Comment:    ==================================================================== [employer] Select 13 (MW) ==================================================================== Test                             Result       Flag       Units  Drug Present and Declared for Prescription Verification   Oxycodone                      120          EXPECTED   ng/mg creat   Noroxycodone                   1698         EXPECTED   ng/mg creat    Sources of oxycodone include scheduled prescription medications.    Noroxycodone is an expected metabolite of oxycodone.  ==================================================================== Test                      Result    Flag   Units      Ref Range   Creatinine              56               mg/dL      >=20 ==================================================================== Declared Medications:  The flagging and interpretation on this report are based on the  following declared medications.  Unexpected results may arise from  inaccuracies in the declared medications.   **Note: The testing scope of this panel includes these medications:   Oxycodone   **Note: The testing scope of this panel does not include the  following reported medications:   Acetaminophen (Tylenol)  Bupropion (Wellbutrin SR)  Cholecalciferol  Doxepin  Esomeprazole (Nexium)  Multivitamin  Ondansetron  Pantoprazole (Protonix)  Sertraline (Zoloft)  Vitamin B12  Zolpidem (Ambien) ==================================================================== For clinical consultation, please call [phone]. ====================================================================      Laboratory Chemistry Profile   Renal Lab Results  Component Value Date   BUN 17 08/15/2021   CREATININE  1.19 (H) 08/15/2021   BCR 11 (L) 01/10/2019   GFRAA 46 (L) 01/10/2019   GFRNONAA 45 (L) 08/15/2021    Hepatic Lab Results  Component Value Date   AST 18 01/10/2019   ALT 11 (L) 06/24/2016   ALBUMIN 4.3 01/10/2019   ALKPHOS 74 01/10/2019   LIPASE 139 04/27/2014    Electrolytes Lab Results  Component Value Date   NA 136 08/15/2021   K 4.0 08/15/2021   CL 104 08/15/2021   CALCIUM 9.1 08/15/2021   MG 2.3 01/10/2019  Bone Lab Results  Component Value Date   25OHVITD1 26 (L) 01/10/2019   25OHVITD2 1.6 01/10/2019   25OHVITD3 24 01/10/2019    Inflammation (CRP: Acute Phase) (ESR: Chronic Phase) Lab Results  Component Value Date   CRP 0.9 08/24/2020   ESRSEDRATE 51 (H) 01/10/2019         Note: Above Lab results reviewed.  Imaging  DG PAIN CLINIC C-ARM 1-60 MIN NO REPORT Fluoro was used, but no Radiologist interpretation will be provided.  Please refer to "NOTES" tab for provider progress note.  Assessment  The primary encounter diagnosis was Chronic low back pain (1ry area of Pain) (Bilateral) (L>R) w/o sciatica. Diagnoses of Scoliosis of thoracolumbar region due to degenerative disease of spine in adult, Grade 1 spondylolisthesis of L4 over L5, Chronic lower extremity pain (2ry area of Pain) (Left), Severe (5-6 mm) L4-5 lumbar spinal stenosis and (8 mm) L3-4 spinal stenosis., Chronic lumbar radicular pain (Left) (L5 Dermatome), Numbness and tingling of left leg, Weakness of left leg, Balance problem, At high risk for falls, and Abnormal MRI, lumbar spine (06/25/2016) were also pertinent to this visit.  Plan of Care  Problem-specific:  No problem-specific Assessment & Plan notes found for this encounter.  Ms. KEYUNA CUTHRELL has a current medication list which includes the following long-term medication(s): bupropion, doxepin, oxycodone, pantoprazole, and zolpidem.  Pharmacotherapy (Medications Ordered): No orders of the defined types were placed in this  encounter.  Orders:  Orders Placed This Encounter  Procedures   Ambulatory referral to Physical Therapy    Referral Priority:   Routine    Referral Type:   Physical Medicine    Referral Reason:   Specialty Services Required    Requested Specialty:   Physical Therapy    Number of Visits Requested:   1    Follow-up plan:   Return in about 1 week (around 08/29/2021) for Eval-day (M,W), (F2F), (MM).     Interventional Therapies  Risk  Complexity Considerations:   Advanced age   Planned  Pending:   Diagnostic left L5 transverse process-sacral ala pseudoarthrosis injection.  If he ends up getting good relief of the pain, at least for the duration of the numbing medicine, she may be a good candidate for corrective surgery of the affected area.   Under consideration:   Diagnostic right lumbar facet block #1  Diagnostic right ischial bursa injection #1  Possible bilateral lumbar facet RFA  Possible bilateral sacroiliac joint RFA  Diagnostic left L4 TFESI  Diagnostic left IA hip joint injection   Diagnostic bilateral femoral nerve + obturator NB  Possible bilateral femoral nerve + obturator nerve RFA    Completed:   Therapeutic left L4-5 LESI x6 (04/10/2020)  Therapeutic right L4-5 LESI x1 (07/21/2017)  Palliative right L5-S1 LESI x1 (09/27/2015)  Therapeutic left L5-S1 LESI x1 (02/05/2016)  Diagnostic right SI joint injection x2 (08/25/2019)  Diagnostic left SI joint injection x2 (01/27/2019)  Diagnostic left glenohumeral joint injection x1 (04/29/2017)  Left biceps muscle trigger point injection x1 (07/21/2017)  Right PSIS trigger point injection x1 (07/27/2018)  Diagnostic right lumbar facet block x1 (10/23/2020)  Diagnostic left lumbar facet block x2 (10/23/2020)  Diagnostic left IA hip injection x1 (01/27/2019)    Palliative options:   None at this time         Recent Visits Date Type Provider Dept  07/30/21 Telemedicine Milinda Pointer, MD [facility]  06/25/21  Procedure visit Milinda Pointer, [facility]  06/19/21 Telemedicine [place],  Beatriz Chancellor, MD [facility]  06/04/21 Procedure visit Milinda Pointer, MD [facility]  Showing recent visits within past 90 days and meeting all other requirements Today's Visits Date Type Provider Dept  08/22/21 Office Visit Milinda Pointer, MD [facility]  Showing today's visits and meeting all other requirements Future Appointments No visits were found meeting these conditions. Showing future appointments within next 90 days and meeting all other requirements I discussed the assessment and treatment plan with the patient. The patient was provided an opportunity to ask questions and all were answered. The patient agreed with the plan and demonstrated an understanding of the instructions.  Patient advised to call back or seek an in-person evaluation if the symptoms or condition worsens.  Duration of encounter: 30 minutes.  Note by: Gaspar Cola, MD Date: 08/22/2021; Time: 5:17 PM

## 2021-08-28 ENCOUNTER — Ambulatory Visit: Payer: PPO | Admitting: Occupational Therapy

## 2021-09-17 ENCOUNTER — Other Ambulatory Visit: Payer: Self-pay | Admitting: Pain Medicine

## 2021-09-17 DIAGNOSIS — G894 Chronic pain syndrome: Secondary | ICD-10-CM

## 2021-09-17 DIAGNOSIS — M545 Low back pain, unspecified: Secondary | ICD-10-CM

## 2021-09-17 DIAGNOSIS — G8929 Other chronic pain: Secondary | ICD-10-CM

## 2021-09-17 DIAGNOSIS — M79605 Pain in left leg: Secondary | ICD-10-CM

## 2021-09-17 DIAGNOSIS — Z79899 Other long term (current) drug therapy: Secondary | ICD-10-CM

## 2021-09-17 DIAGNOSIS — Z79891 Long term (current) use of opiate analgesic: Secondary | ICD-10-CM

## 2021-09-18 ENCOUNTER — Other Ambulatory Visit: Payer: Self-pay

## 2021-09-18 ENCOUNTER — Inpatient Hospital Stay: Payer: PPO | Admitting: Internal Medicine

## 2021-09-18 ENCOUNTER — Inpatient Hospital Stay: Payer: PPO

## 2021-09-18 ENCOUNTER — Inpatient Hospital Stay: Payer: PPO | Attending: Internal Medicine

## 2021-09-18 VITALS — BP 128/70 | HR 81 | Resp 18

## 2021-09-18 DIAGNOSIS — R5383 Other fatigue: Secondary | ICD-10-CM | POA: Insufficient documentation

## 2021-09-18 DIAGNOSIS — Z886 Allergy status to analgesic agent status: Secondary | ICD-10-CM | POA: Insufficient documentation

## 2021-09-18 DIAGNOSIS — M2550 Pain in unspecified joint: Secondary | ICD-10-CM | POA: Diagnosis not present

## 2021-09-18 DIAGNOSIS — Z818 Family history of other mental and behavioral disorders: Secondary | ICD-10-CM | POA: Diagnosis not present

## 2021-09-18 DIAGNOSIS — K5900 Constipation, unspecified: Secondary | ICD-10-CM | POA: Insufficient documentation

## 2021-09-18 DIAGNOSIS — N1832 Chronic kidney disease, stage 3b: Secondary | ICD-10-CM

## 2021-09-18 DIAGNOSIS — D631 Anemia in chronic kidney disease: Secondary | ICD-10-CM | POA: Insufficient documentation

## 2021-09-18 DIAGNOSIS — Z9049 Acquired absence of other specified parts of digestive tract: Secondary | ICD-10-CM | POA: Insufficient documentation

## 2021-09-18 DIAGNOSIS — M549 Dorsalgia, unspecified: Secondary | ICD-10-CM | POA: Diagnosis not present

## 2021-09-18 DIAGNOSIS — Z79899 Other long term (current) drug therapy: Secondary | ICD-10-CM | POA: Insufficient documentation

## 2021-09-18 DIAGNOSIS — Z8673 Personal history of transient ischemic attack (TIA), and cerebral infarction without residual deficits: Secondary | ICD-10-CM | POA: Diagnosis not present

## 2021-09-18 DIAGNOSIS — E611 Iron deficiency: Secondary | ICD-10-CM

## 2021-09-18 DIAGNOSIS — G8929 Other chronic pain: Secondary | ICD-10-CM | POA: Diagnosis not present

## 2021-09-18 DIAGNOSIS — Z836 Family history of other diseases of the respiratory system: Secondary | ICD-10-CM | POA: Insufficient documentation

## 2021-09-18 DIAGNOSIS — Z87891 Personal history of nicotine dependence: Secondary | ICD-10-CM | POA: Insufficient documentation

## 2021-09-18 DIAGNOSIS — M1990 Unspecified osteoarthritis, unspecified site: Secondary | ICD-10-CM | POA: Insufficient documentation

## 2021-09-18 LAB — BASIC METABOLIC PANEL
Anion gap: 9 (ref 5–15)
BUN: 11 mg/dL (ref 8–23)
CO2: 23 mmol/L (ref 22–32)
Calcium: 9.1 mg/dL (ref 8.9–10.3)
Chloride: 103 mmol/L (ref 98–111)
Creatinine, Ser: 0.94 mg/dL (ref 0.44–1.00)
GFR, Estimated: 59 mL/min — ABNORMAL LOW (ref >60)
Glucose, Bld: 87 mg/dL (ref 70–99)
Potassium: 4 mmol/L (ref 3.5–5.1)
Sodium: 135 mmol/L (ref 135–145)

## 2021-09-18 LAB — CBC WITH DIFFERENTIAL/PLATELET
Abs Immature Granulocytes: 0.03 10*3/uL (ref 0.00–0.07)
Basophils Absolute: 0.1 10*3/uL (ref 0.0–0.1)
Basophils Relative: 1 %
Eosinophils Absolute: 0.2 10*3/uL (ref 0.0–0.5)
Eosinophils Relative: 3 %
HCT: 37.2 % (ref 36.0–46.0)
Hemoglobin: 11.6 g/dL — ABNORMAL LOW (ref 12.0–15.0)
Immature Granulocytes: 0 %
Lymphocytes Relative: 23 %
Lymphs Abs: 2 10*3/uL (ref 0.7–4.0)
MCH: 26.1 pg (ref 26.0–34.0)
MCHC: 31.2 g/dL (ref 30.0–36.0)
MCV: 83.6 fL (ref 80.0–100.0)
Monocytes Absolute: 0.7 10*3/uL (ref 0.1–1.0)
Monocytes Relative: 9 %
Neutro Abs: 5.5 10*3/uL (ref 1.7–7.7)
Neutrophils Relative %: 64 %
Platelets: 411 10*3/uL — ABNORMAL HIGH (ref 150–400)
RBC: 4.45 MIL/uL (ref 3.87–5.11)
RDW: 15.9 % — ABNORMAL HIGH (ref 11.5–15.5)
WBC: 8.6 10*3/uL (ref 4.0–10.5)
nRBC: 0 % (ref 0.0–0.2)

## 2021-09-18 MED ORDER — IRON SUCROSE 20 MG/ML IV SOLN
200.0000 mg | Freq: Once | INTRAVENOUS | Status: AC
  Administered 2021-09-18: 200 mg via INTRAVENOUS
  Filled 2021-09-18: qty 10

## 2021-09-18 MED ORDER — SODIUM CHLORIDE 0.9 % IV SOLN: 200.0000 mg | Freq: Once | INTRAVENOUS | Status: DC

## 2021-09-18 MED ORDER — SODIUM CHLORIDE 0.9 % IV SOLN
Freq: Once | INTRAVENOUS | Status: AC
Start: 2021-09-18 — End: 2021-09-18
  Filled 2021-09-18: qty 250

## 2021-09-18 NOTE — Progress Notes (Signed)
[place] NOTE  Patient Care Team: [name], MD as PCP - General (Family Medicine) Cammie Sickle, MD as Consulting Physician (Hematology and Oncology)  CHIEF COMPLAINTS/PURPOSE OF CONSULTATION: Anemia   HEMATOLOGY HISTORY  # CHRONIC NORMOCYTIC ANEMIA [since 2019-PCP- Hb 8-9; Iron sat- 5%;NO Ferritin; GFR- 43]EGD/colonoscopy-?2013-2104- pt declines procedures; Intolerance to p.o. iron. IV Venofer-  #CKD stage III/chronic pain back-pain clinic narcotics.  HISTORY OF PRESENTING ILLNESS: In a wheelchair.  Accompanied by daughter. Cynthia Bennett 87 y.o.  female history of anemia secondary ?chronic kidney disease is here for follow-up.  Complains of worsening constipation.  However patient has been taking increased dose of narcotic prescription medication because of recent fall.  She is currently tapering off the narcotic pain medication.  Patient was to have improvement of her symptoms of fatigue post iron infusion.  Review of Systems  Constitutional:  Positive for malaise/fatigue. Negative for chills, diaphoresis and fever.  HENT:  Negative for nosebleeds and sore throat.   Eyes:  Negative for double vision.  Respiratory:  Negative for cough, hemoptysis, sputum production, shortness of breath and wheezing.   Cardiovascular:  Negative for chest pain, palpitations, orthopnea and leg swelling.  Gastrointestinal:  Negative for abdominal pain, blood in stool, constipation, diarrhea, heartburn, melena, nausea and vomiting.  Genitourinary:  Negative for dysuria, frequency and urgency.  Musculoskeletal:  Positive for back pain and joint pain.  Skin: Negative.  Negative for itching and rash.  Neurological:  Negative for dizziness, tingling, focal weakness, weakness and headaches.  Endo/Heme/Allergies:  Does not bruise/bleed easily.  Psychiatric/Behavioral:  Negative for depression. The patient is not nervous/anxious and does not have insomnia.    MEDICAL  HISTORY:  Past Medical History:  Diagnosis Date   Dizzy spells    Fibromyalgia    Glaucoma    Hiatal hernia    Osteoarthritis    Osteoarthritis of spine with radiculopathy, lumbar region 08/14/2015   Peptic ulcer disease    Reflux    Spinal stenosis    Stroke ([place])    TIA (transient ischemic attack)     SURGICAL HISTORY: Past Surgical History:  Procedure Laterality Date   ABDOMINAL HYSTERECTOMY     APPENDECTOMY     ESOPHAGOGASTRODUODENOSCOPY N/A 04/16/2015   Procedure: ESOPHAGOGASTRODUODENOSCOPY (EGD);  Surgeon: Hulen Luster, MD;  Location: [facility] ENDOSCOPY;  Service: Gastroenterology;  Laterality: N/A;    SOCIAL HISTORY: Social History   Socioeconomic History   Marital status: Widowed    Spouse name: Not on file   Number of children: Not on file   Years of education: Not on file   Highest education level: Not on file  Occupational History   Not on file  Tobacco Use   Smoking status: Former   Smokeless tobacco: Never  Substance and Sexual Activity   Alcohol use: No    Alcohol/week: 0.0 standard drinks   Drug use: No   Sexual activity: Not on file  Other Topics Concern   Not on file  Social History Narrative   Pt lives in [place]; with son. Quit smoking in 1957. No alcohol. Designed clothes/ alterations.    Social Determinants of Health   Financial Resource Strain: Not on file  Food Insecurity: Not on file  Transportation Needs: Not on file  Physical Activity: Not on file  Stress: Not on file  Social Connections: Not on file  Intimate Partner Violence: Not on file    FAMILY HISTORY: Family History  Problem Relation Age of Onset  Mental illness Mother    COPD Father     ALLERGIES:  is allergic to ibuprofen.  MEDICATIONS:  Current Outpatient Medications  Medication Sig Dispense Refill   acetaminophen (TYLENOL) 325 MG tablet Take 650 mg by mouth every 6 (six) hours as needed.     buPROPion (WELLBUTRIN SR) 150 MG 12 hr tablet TAKE 1 TABLET BY MOUTH ONCE  DAILY     Cholecalciferol (VITAMIN D3) 20 MCG (800 UNIT) TABS Take by mouth 2 (two) times daily.     cyanocobalamin (,VITAMIN B-12,) 1000 MCG/ML injection Inject 1,000 mcg into the muscle every 30 (thirty) days.     doxepin (SINEQUAN) 100 MG capsule Take 100 mg by mouth at bedtime.     ondansetron (ZOFRAN) 8 MG tablet TAKE 1 TABLET BY MOUTH EVERY 8 HOURS AS NEEDED FOR NAUSEA AND VOMITING 45 tablet 1   oxyCODONE (OXY IR/ROXICODONE) 5 MG immediate release tablet Take 1 tablet (5 mg total) by mouth every 8 (eight) hours as needed for severe pain. Must last 30 days 90 tablet 0   pantoprazole (PROTONIX) 40 MG tablet Take 1 tablet by mouth 2 (two) times daily.  11   zolpidem (AMBIEN) 10 MG tablet TAKE 1/2 TO 1 TABLET BY MOUTH EVERY NIGHT AT BEDTIME     sertraline (ZOLOFT) 100 MG tablet Take 100 mg by mouth daily. (Patient not taking: Reported on 09/18/2021)     No current facility-administered medications for this visit.      PHYSICAL EXAMINATION:   Vitals:   09/18/21 1307  BP: 127/64  Pulse: 97  Resp: 18  Temp: 99.1 F (37.3 C)  SpO2: 97%   Filed Weights   09/18/21 1307  Weight: 144 lb (65.3 kg)    Physical Exam HENT:     Head: Normocephalic and atraumatic.     Mouth/Throat:     Pharynx: No oropharyngeal exudate.  Eyes:     Pupils: Pupils are equal, round, and reactive to light.  Cardiovascular:     Rate and Rhythm: Normal rate and regular rhythm.  Pulmonary:     Effort: Pulmonary effort is normal. No respiratory distress.     Breath sounds: Normal breath sounds. No wheezing.  Abdominal:     General: Bowel sounds are normal. There is no distension.     Palpations: Abdomen is soft. There is no mass.     Tenderness: There is no abdominal tenderness. There is no guarding or rebound.  Musculoskeletal:        General: No tenderness. Normal range of motion.     Cervical back: Normal range of motion and neck supple.  Skin:    General: Skin is warm.  Neurological:     Mental  Status: She is alert and oriented to person, place, and time.  Psychiatric:        Mood and Affect: Affect normal.    LABORATORY DATA:  I have reviewed the data as listed Lab Results  Component Value Date   WBC 8.6 09/18/2021   HGB 11.6 (L) 09/18/2021   HCT 37.2 09/18/2021   MCV 83.6 09/18/2021   PLT 411 (H) 09/18/2021   Recent Labs    05/20/21 1243 08/15/21 1018 09/18/21 1240  NA 135 136 135  K 4.2 4.0 4.0  CL 104 104 103  CO2 24 24 23   GLUCOSE 83 109* 87  BUN 14 17 11   CREATININE 1.03* 1.19* 0.94  CALCIUM 9.2 9.1 9.1  GFRNONAA 53* 45* 59*     No  results found.  Anemia due to stage 3b chronic kidney disease (HCC) # Anemia iron def/CKD stageIII : Hb-8-9 [MCV- 77] s/p IV iron infusion hemoglobin up to 11.6 today. Proceed with Venofer today. PO iron significant GI discomfort [even with PO every otherday].  October 2022 iron saturation 15%.  # CKDstage III-stable.  # Constipation: ? Narcotics-defer to PCP/pain clinic.   # Chronic back pain/? scaitica [Dr.Neivera]-stable.  # DISPOSITION: # Venofer today # follow up in 4 months- MD; ; labs- cbc/bmp/iron studies/ferritin-- possible IVvenofer- Dr.B  Cc; Dr.Hedrick    All questions were answered. The patient knows to call the clinic with any problems, questions or concerns.      Earna Coder, MD 09/18/2021 2:07 PM

## 2021-09-18 NOTE — Progress Notes (Signed)
Pt reports weakness and fatigue. Denies shortness of breath or dizziness. Walks short distances, but is limited by chronic back pain.

## 2021-09-18 NOTE — Assessment & Plan Note (Addendum)
#   Anemia iron def/CKD stageIII : Hb-8-9 [MCV- 77] s/p IV iron infusion hemoglobin up to 11.6 today. Proceed with Venofer today. PO iron significant GI discomfort [even with PO every otherday].  October 2022 iron saturation 15%.  # CKDstage III-stable.  # Constipation: ? Narcotics-defer to PCP/pain clinic.   # Chronic back pain/? scaitica [Dr.Neivera]-stable.  # DISPOSITION: # Venofer today # follow up in 4 months- MD; ; labs- cbc/bmp/iron studies/ferritin-- possible IVvenofer- Dr.B  Cc; Dr.Hedrick

## 2021-09-18 NOTE — Patient Instructions (Signed)
[facility]  Discharge Instructions: Thank you for choosing [facility] to provide your oncology and hematology care.  If you have a lab appointment with the Cancer Center, please go directly to the Cancer Center and check in at the registration area.  Wear comfortable clothing and clothing appropriate for easy access to any Portacath or PICC line.   We strive to give you quality time with your provider. You may need to reschedule your appointment if you arrive late (15 or more minutes).  Arriving late affects you and other patients whose appointments are after yours.  Also, if you miss three or more appointments without notifying the office, you may be dismissed from the clinic at the provider's discretion.      For prescription refill requests, have your pharmacy contact our office and allow 72 hours for refills to be completed.    Today you received the following chemotherapy and/or immunotherapy agents VENOFER      To help prevent nausea and vomiting after your treatment, we encourage you to take your nausea medication as directed.  BELOW ARE SYMPTOMS THAT SHOULD BE REPORTED IMMEDIATELY: *FEVER GREATER THAN 100.4 F (38 C) OR HIGHER *CHILLS OR SWEATING *NAUSEA AND VOMITING THAT IS NOT CONTROLLED WITH YOUR NAUSEA MEDICATION *UNUSUAL SHORTNESS OF BREATH *UNUSUAL BRUISING OR BLEEDING *URINARY PROBLEMS (pain or burning when urinating, or frequent urination) *BOWEL PROBLEMS (unusual diarrhea, constipation, pain near the anus) TENDERNESS IN MOUTH AND THROAT WITH OR WITHOUT PRESENCE OF ULCERS (sore throat, sores in mouth, or a toothache) UNUSUAL RASH, SWELLING OR PAIN  UNUSUAL VAGINAL DISCHARGE OR ITCHING   Items with * indicate a potential emergency and should be followed up as soon as possible or go to the Emergency Department if any problems should occur.  Please show the CHEMOTHERAPY ALERT CARD or IMMUNOTHERAPY ALERT CARD at check-in to  the Emergency Department and triage nurse.  Should you have questions after your visit or need to cancel or reschedule your appointment, please contact [facility]  [phone] and follow the prompts.  Office hours are 8:00 a.m. to 4:30 p.m. Monday - Friday. Please note that voicemails left after 4:00 p.m. may not be returned until the following business day.  We are closed weekends and major holidays. You have access to a nurse at all times for urgent questions. Please call the main number to the clinic [phone] and follow the prompts.  For any non-urgent questions, you may also contact your provider using MyChart. We now offer e-Visits for anyone 18 and older to request care online for non-urgent symptoms. For details visit [email or web address].   Also download the MyChart app! Go to the app store, search "MyChart", open the app, select [facility], and log in with your MyChart username and password.  Due to Covid, a mask is required upon entering the hospital/clinic. If you do not have a mask, one will be given to you upon arrival. For doctor visits, patients may have 1 support person aged 18 or older with them. For treatment visits, patients cannot have anyone with them due to current Covid guidelines and our immunocompromised population.   Iron Sucrose Injection What is this medication? IRON SUCROSE (EYE ern SOO krose) treats low levels of iron (iron deficiency anemia) in people with kidney disease. Iron is a mineral that plays an important role in making red blood cells, which carry oxygen from your lungs to the rest of your body. This medicine may   be used for other purposes; ask your health care provider or pharmacist if you have questions. COMMON BRAND NAME(S): Venofer What should I tell my care team before I take this medication? They need to know if you have any of these conditions: Anemia not caused by low iron levels Heart disease High levels  of iron in the blood Kidney disease Liver disease An unusual or allergic reaction to iron, other medications, foods, dyes, or preservatives Pregnant or trying to get pregnant Breast-feeding How should I use this medication? This medication is for infusion into a vein. It is given in a hospital or clinic setting. Talk to your care team about the use of this medication in children. While this medication may be prescribed for children as young as 2 years for selected conditions, precautions do apply. Overdosage: If you think you have taken too much of this medicine contact a poison control center or emergency room at once. NOTE: This medicine is only for you. Do not share this medicine with others. What if I miss a dose? It is important not to miss your dose. Call your care team if you are unable to keep an appointment. What may interact with this medication? Do not take this medication with any of the following: Deferoxamine Dimercaprol Other iron products This medication may also interact with the following: Chloramphenicol Deferasirox This list may not describe all possible interactions. Give your health care provider a list of all the medicines, herbs, non-prescription drugs, or dietary supplements you use. Also tell them if you smoke, drink alcohol, or use illegal drugs. Some items may interact with your medicine. What should I watch for while using this medication? Visit your care team regularly. Tell your care team if your symptoms do not start to get better or if they get worse. You may need blood work done while you are taking this medication. You may need to follow a special diet. Talk to your care team. Foods that contain iron include: whole grains/cereals, dried fruits, beans, or peas, leafy green vegetables, and organ meats (liver, kidney). What side effects may I notice from receiving this medication? Side effects that you should report to your care team as soon as  possible: Allergic reactions--skin rash, itching, hives, swelling of the face, lips, tongue, or throat Low blood pressure--dizziness, feeling faint or lightheaded, blurry vision Shortness of breath Side effects that usually do not require medical attention (report to your care team if they continue or are bothersome): Flushing Headache Joint pain Muscle pain Nausea Pain, redness, or irritation at injection site This list may not describe all possible side effects. Call your doctor for medical advice about side effects. You may report side effects to FDA at 1-800-FDA-1088. Where should I keep my medication? This medication is given in a hospital or clinic and will not be stored at home. NOTE: This sheet is a summary. It may not cover all possible information. If you have questions about this medicine, talk to your doctor, pharmacist, or health care provider.  2022 Elsevier/Gold Standard (2021-03-22 00:00:00)  

## 2021-09-19 LAB — THYROID PANEL WITH TSH
Free Thyroxine Index: 1.7 (ref 1.2–4.9)
T3 Uptake Ratio: 23 % — ABNORMAL LOW (ref 24–39)
T4, Total: 7.2 ug/dL (ref 4.5–12.0)
TSH: 2.16 u[IU]/mL (ref 0.450–4.500)

## 2021-09-23 ENCOUNTER — Other Ambulatory Visit: Payer: Self-pay | Admitting: Pain Medicine

## 2021-09-23 DIAGNOSIS — Z79899 Other long term (current) drug therapy: Secondary | ICD-10-CM

## 2021-09-23 DIAGNOSIS — R300 Dysuria: Secondary | ICD-10-CM | POA: Diagnosis not present

## 2021-09-23 DIAGNOSIS — N1832 Chronic kidney disease, stage 3b: Secondary | ICD-10-CM | POA: Diagnosis not present

## 2021-09-23 DIAGNOSIS — D631 Anemia in chronic kidney disease: Secondary | ICD-10-CM | POA: Diagnosis not present

## 2021-09-23 DIAGNOSIS — E785 Hyperlipidemia, unspecified: Secondary | ICD-10-CM | POA: Diagnosis not present

## 2021-09-23 DIAGNOSIS — G4700 Insomnia, unspecified: Secondary | ICD-10-CM | POA: Diagnosis not present

## 2021-09-23 DIAGNOSIS — G894 Chronic pain syndrome: Secondary | ICD-10-CM

## 2021-09-23 DIAGNOSIS — M797 Fibromyalgia: Secondary | ICD-10-CM | POA: Diagnosis not present

## 2021-09-23 DIAGNOSIS — R8290 Unspecified abnormal findings in urine: Secondary | ICD-10-CM | POA: Diagnosis not present

## 2021-09-23 DIAGNOSIS — M79605 Pain in left leg: Secondary | ICD-10-CM

## 2021-09-23 DIAGNOSIS — K5900 Constipation, unspecified: Secondary | ICD-10-CM | POA: Diagnosis not present

## 2021-09-23 DIAGNOSIS — D509 Iron deficiency anemia, unspecified: Secondary | ICD-10-CM | POA: Diagnosis not present

## 2021-09-23 DIAGNOSIS — Z79891 Long term (current) use of opiate analgesic: Secondary | ICD-10-CM

## 2021-09-23 DIAGNOSIS — E559 Vitamin D deficiency, unspecified: Secondary | ICD-10-CM | POA: Diagnosis not present

## 2021-09-23 DIAGNOSIS — M545 Low back pain, unspecified: Secondary | ICD-10-CM

## 2021-09-23 DIAGNOSIS — G8929 Other chronic pain: Secondary | ICD-10-CM

## 2021-10-02 ENCOUNTER — Other Ambulatory Visit: Payer: Self-pay

## 2021-10-02 ENCOUNTER — Encounter: Payer: Self-pay | Admitting: Pain Medicine

## 2021-10-02 ENCOUNTER — Ambulatory Visit: Payer: PPO | Attending: Pain Medicine | Admitting: Pain Medicine

## 2021-10-02 VITALS — BP 123/81 | HR 113 | Temp 97.3°F | Resp 16 | Ht 63.0 in | Wt 140.0 lb

## 2021-10-02 DIAGNOSIS — M5116 Intervertebral disc disorders with radiculopathy, lumbar region: Secondary | ICD-10-CM | POA: Insufficient documentation

## 2021-10-02 DIAGNOSIS — W19XXXA Unspecified fall, initial encounter: Secondary | ICD-10-CM

## 2021-10-02 DIAGNOSIS — M533 Sacrococcygeal disorders, not elsewhere classified: Secondary | ICD-10-CM | POA: Diagnosis not present

## 2021-10-02 DIAGNOSIS — M5135 Other intervertebral disc degeneration, thoracolumbar region: Secondary | ICD-10-CM | POA: Diagnosis not present

## 2021-10-02 DIAGNOSIS — M4319 Spondylolisthesis, multiple sites in spine: Secondary | ICD-10-CM | POA: Diagnosis not present

## 2021-10-02 DIAGNOSIS — M47812 Spondylosis without myelopathy or radiculopathy, cervical region: Secondary | ICD-10-CM | POA: Insufficient documentation

## 2021-10-02 DIAGNOSIS — Q7649 Other congenital malformations of spine, not associated with scoliosis: Secondary | ICD-10-CM

## 2021-10-02 DIAGNOSIS — M545 Low back pain, unspecified: Secondary | ICD-10-CM | POA: Diagnosis not present

## 2021-10-02 DIAGNOSIS — M48061 Spinal stenosis, lumbar region without neurogenic claudication: Secondary | ICD-10-CM | POA: Insufficient documentation

## 2021-10-02 DIAGNOSIS — M5134 Other intervertebral disc degeneration, thoracic region: Secondary | ICD-10-CM | POA: Insufficient documentation

## 2021-10-02 DIAGNOSIS — M4726 Other spondylosis with radiculopathy, lumbar region: Secondary | ICD-10-CM | POA: Insufficient documentation

## 2021-10-02 DIAGNOSIS — Z79899 Other long term (current) drug therapy: Secondary | ICD-10-CM | POA: Diagnosis not present

## 2021-10-02 DIAGNOSIS — M19012 Primary osteoarthritis, left shoulder: Secondary | ICD-10-CM | POA: Insufficient documentation

## 2021-10-02 DIAGNOSIS — M79605 Pain in left leg: Secondary | ICD-10-CM | POA: Insufficient documentation

## 2021-10-02 DIAGNOSIS — M25551 Pain in right hip: Secondary | ICD-10-CM | POA: Diagnosis not present

## 2021-10-02 DIAGNOSIS — M25552 Pain in left hip: Secondary | ICD-10-CM | POA: Diagnosis not present

## 2021-10-02 DIAGNOSIS — Z79891 Long term (current) use of opiate analgesic: Secondary | ICD-10-CM | POA: Diagnosis not present

## 2021-10-02 DIAGNOSIS — M5136 Other intervertebral disc degeneration, lumbar region: Secondary | ICD-10-CM | POA: Diagnosis not present

## 2021-10-02 DIAGNOSIS — M5416 Radiculopathy, lumbar region: Secondary | ICD-10-CM

## 2021-10-02 DIAGNOSIS — M47816 Spondylosis without myelopathy or radiculopathy, lumbar region: Secondary | ICD-10-CM | POA: Diagnosis not present

## 2021-10-02 DIAGNOSIS — M797 Fibromyalgia: Secondary | ICD-10-CM | POA: Insufficient documentation

## 2021-10-02 DIAGNOSIS — G8929 Other chronic pain: Secondary | ICD-10-CM

## 2021-10-02 DIAGNOSIS — M4155 Other secondary scoliosis, thoracolumbar region: Secondary | ICD-10-CM | POA: Insufficient documentation

## 2021-10-02 DIAGNOSIS — M4316 Spondylolisthesis, lumbar region: Secondary | ICD-10-CM

## 2021-10-02 DIAGNOSIS — G894 Chronic pain syndrome: Secondary | ICD-10-CM

## 2021-10-02 DIAGNOSIS — M48062 Spinal stenosis, lumbar region with neurogenic claudication: Secondary | ICD-10-CM

## 2021-10-02 DIAGNOSIS — Z9181 History of falling: Secondary | ICD-10-CM | POA: Insufficient documentation

## 2021-10-02 DIAGNOSIS — M169 Osteoarthritis of hip, unspecified: Secondary | ICD-10-CM | POA: Diagnosis not present

## 2021-10-02 DIAGNOSIS — M79622 Pain in left upper arm: Secondary | ICD-10-CM | POA: Diagnosis not present

## 2021-10-02 MED ORDER — OXYCODONE HCL 5 MG PO TABS: 5.0000 mg | ORAL_TABLET | Freq: Three times a day (TID) | ORAL | 0 refills | Status: DC | PRN

## 2021-10-02 NOTE — Progress Notes (Signed)
PROVIDER NOTE: Information contained herein reflects review and annotations entered in association with encounter. Interpretation of such information and data should be left to medically-trained personnel. Information provided to patient can be located elsewhere in the medical record under "Patient Instructions". Document created using STT-dictation technology, any transcriptional errors that may result from process are unintentional.    Patient: Cynthia Bennett  Service Category: E/M  Provider: Gaspar Cola, MD  DOB: 1933/11/05  DOS: 10/02/2021  Specialty: Interventional Pain Management  MRN: [id number]  Setting: Ambulatory outpatient  PCP: [name], MD  Type: Established Patient    Referring Provider: [name], MD  Location: Office  Delivery: Face-to-face     HPI  [name], a 87 y.o. year old female, is here today because of her Acute exacerbation of chronic low back pain [M54.50, G89.29]. Cynthia Bennett primary complain today is Back Pain (Low left) Last encounter: My last encounter with her was on 09/23/2021. Pertinent problems: Cynthia Bennett has Fibromyalgia; Chronic pain syndrome; Lumbar radicular pain (Bilateral) (R>L); Chronic low back pain (1ry area of Pain) (Bilateral) (L>R) w/o sciatica; Grade 1 spondylolisthesis of L4 over L5; Lumbar discogenic pain syndrome; Lumbar facet syndrome (Bilateral) (L>R); Lumbar facet hypertrophy (Bilateral); Chronic sacroiliac joint pain (Left); Chronic hip pain (Left); Lumbar spondylosis; Abnormal MRI, lumbar spine (06/25/2016); Cervical spondylosis (Anterolisthesis of C5 over C6); Chronic lower extremity pain (2ry area of Pain) (Left); Chronic lumbar radicular pain (Left) (L5 Dermatome); Osteoarthritis of hip (Left); Chronic sacroiliac joint pain (Bilateral) (R>L); Chronic shoulder pain (Left); Osteoarthritis of shoulder (Left); Chronic sacroiliac joint pain (Right); Chronic hip pain (Right); Left upper arm pain; Myofascial pain syndrome (left upper  extremity); Acromioclavicular joint pain (Left); Osteoarthritis of AC (acromioclavicular) joint (Left); Supraspinatus tendon tear, sequela (Left); Spondylosis without myelopathy or radiculopathy, lumbosacral region; Other specified dorsopathies, sacral and sacrococcygeal region; Bertolotti's syndrome (Left); DDD (degenerative disc disease), lumbar; Severe (5-6 mm) L4-5 lumbar spinal stenosis and (8 mm) L3-4 spinal stenosis.; Osteoarthritis of hip (Right); Trigger point with back pain (Right); Chronic myofascial pain; Chronic low back pain (Right) w/o sciatica; Chronic hip pain (3ry area of Pain) (Bilateral) (R>L); Osteoarthritis of hips (Bilateral); Chronic low back pain (Left) w/o sciatica; Thoracic back pain (Left); DDD (degenerative disc disease), thoracic; DDD (degenerative disc disease), thoracolumbar; and Scoliosis of thoracolumbar region due to degenerative disease of spine in adult on their pertinent problem list. Pain Assessment: Severity of Chronic pain is reported as a 8 /10. Location: Back Lower/radiates upward. Onset: More than a month ago. Quality: Sore. Timing: Intermittent. Modifying factor(s): sitting. Vitals:  height is 5' 3"  (1.6 m) and weight is 140 lb (63.5 kg). Her temperature is 97.3 F (36.3 C) (abnormal). Her blood pressure is 123/81 and her pulse is 113 (abnormal). Her respiration is 16 and oxygen saturation is 99%.   Reason for encounter: medication management.  The patient indicates doing well with the current medication regimen. No adverse reactions or side effects reported to the medications.  The patient still has 4 pills left from her last prescription filled on 07/21/2021.  This means that she is using approximately 1 pill/day on the average.  Today I will change her prescription to reflect her use.   The patient indicates doing well with the current medication regimen. No adverse reactions or side effects reported to the medications.   In addition to this, the patient  recently had a fall around September 9 where she was in the bathroom and she felt onto her left lower  back and flank.  The patient refers having an increase in her low back pain since then.  She denies having had any x-rays done despite the fact that she is 87 years old.  She refers that when she is leaning forward with her lumbar spine flexed, her left-sided low back pain seems to ease off.  She does have a history of a grade 1 spondylolisthesis of L4 over L5.  At this point she denies any left lower extremity pain, but she refers that she is having numbness over the distribution of the L5 dermatome.  In addition, the patient does have a history of a documented lumbar facet syndrome with documented lumbar facet hypertrophy.  Today's exam is suggestive of a lumbar facet pain, however in view of her age and high risk of fractures secondary to postmenopausal osteoporosis, today I will be ordering some x-rays of the lumbar spine.  I will tentatively bring her back for a left-sided lumbar facet block as well as an L4 transforaminal ESI.  The facet block is to help with the back pain and the L4 transforaminal is to help with the numbness that she is experiencing on her left lower extremity.  The plan was shared with the patient who understood and accepted.  RTCB: 12/31/2021  Pharmacotherapy Assessment  Analgesic: Oxycodone IR 5 mg, 1 tab PO 3 times daily (maximum of 15 mg/day of oxycodone.) MME/day:  22.5 mg/day.   Monitoring: [place] PMP: PDMP reviewed during this encounter.       Pharmacotherapy: No side-effects or adverse reactions reported. Compliance: No problems identified. Effectiveness: Clinically acceptable.  Dewayne Shorter, RN  10/02/2021  8:04 AM  Sign when Signing Visit Nursing Pain Medication Assessment:  Safety precautions to be maintained throughout the outpatient stay will include: orient to surroundings, keep bed in low position, maintain call bell within reach at all times, provide assistance  with transfer out of bed and ambulation.  Medication Inspection Compliance: Pill count conducted under aseptic conditions, in front of the patient. Neither the pills nor the bottle was removed from the patient's sight at any time. Once count was completed pills were immediately returned to the patient in their original bottle.  Medication: Oxycodone IR Pill/Patch Count:  4 of 90 pills remain Pill/Patch Appearance: Markings consistent with prescribed medication Bottle Appearance: Standard pharmacy container. Clearly labeled. Filled Date: 09 / 11 / 2022 Last Medication intake:  Today    UDS:  Summary  Date Value Ref Range Status  05/22/2021 Note  Final    Comment:    ==================================================================== [employer] [facility] 13 (MW) ==================================================================== Test                             Result       Flag       Units  Drug Present and Declared for Prescription Verification   Oxycodone                      120          EXPECTED   ng/mg creat   Noroxycodone                   1698         EXPECTED   ng/mg creat    Sources of oxycodone include scheduled prescription medications.    Noroxycodone is an expected metabolite of oxycodone.  ==================================================================== Test  Result    Flag   Units      Ref Range   Creatinine              56               mg/dL      >=20 ==================================================================== Declared Medications:  The flagging and interpretation on this report are based on the  following declared medications.  Unexpected results may arise from  inaccuracies in the declared medications.   **Note: The testing scope of this panel includes these medications:   Oxycodone   **Note: The testing scope of this panel does not include the  following reported medications:   Acetaminophen (Tylenol)  Bupropion (Wellbutrin  SR)  Cholecalciferol  Doxepin  Esomeprazole (Nexium)  Multivitamin  Ondansetron  Pantoprazole (Protonix)  Sertraline (Zoloft)  Vitamin B12  Zolpidem (Ambien) ==================================================================== For clinical consultation, please call [phone]. ====================================================================      ROS  Constitutional: Denies any fever or chills Gastrointestinal: No reported hemesis, hematochezia, vomiting, or acute GI distress Musculoskeletal: Denies any acute onset joint swelling, redness, loss of ROM, or weakness Neurological: No reported episodes of acute onset apraxia, aphasia, dysarthria, agnosia, amnesia, paralysis, loss of coordination, or loss of consciousness  Medication Review  Vitamin D3, acetaminophen, buPROPion, cyanocobalamin, doxepin, ondansetron, oxyCODONE, pantoprazole, sertraline, and zolpidem  History Review  Allergy: Cynthia Bennett is allergic to ibuprofen. Drug: Cynthia Bennett  reports no history of drug use. Alcohol:  reports no history of alcohol use. Tobacco:  reports that she has quit smoking. She has never used smokeless tobacco. Social: Cynthia Bennett  reports that she has quit smoking. She has never used smokeless tobacco. She reports that she does not drink alcohol and does not use drugs. Medical:  has a past medical history of Dizzy spells, Fibromyalgia, Glaucoma, Hiatal hernia, Osteoarthritis, Osteoarthritis of spine with radiculopathy, lumbar region (08/14/2015), Peptic ulcer disease, Reflux, Spinal stenosis, Stroke ([place]), and TIA (transient ischemic attack). Surgical: Cynthia Bennett  has a past surgical history that includes Esophagogastroduodenoscopy (N/A, 04/16/2015); Appendectomy; and Abdominal hysterectomy. Family: family history includes COPD in her father; Mental illness in her mother.  Laboratory Chemistry Profile   Renal Lab Results  Component Value Date   BUN 11 09/18/2021   CREATININE 0.94 09/18/2021    BCR 11 (L) 01/10/2019   GFRAA 46 (L) 01/10/2019   GFRNONAA 59 (L) 09/18/2021    Hepatic Lab Results  Component Value Date   AST 18 01/10/2019   ALT 11 (L) 06/24/2016   ALBUMIN 4.3 01/10/2019   ALKPHOS 74 01/10/2019   LIPASE 139 04/27/2014    Electrolytes Lab Results  Component Value Date   NA 135 09/18/2021   K 4.0 09/18/2021   CL 103 09/18/2021   CALCIUM 9.1 09/18/2021   MG 2.3 01/10/2019    Bone Lab Results  Component Value Date   25OHVITD1 26 (L) 01/10/2019   25OHVITD2 1.6 01/10/2019   25OHVITD3 24 01/10/2019    Inflammation (CRP: Acute Phase) (ESR: Chronic Phase) Lab Results  Component Value Date   CRP 0.9 08/24/2020   ESRSEDRATE 51 (H) 01/10/2019         Note: Above Lab results reviewed.  Recent Imaging Review  DG PAIN CLINIC C-ARM 1-60 MIN NO REPORT Fluoro was used, but no Radiologist interpretation will be provided.  Please refer to "NOTES" tab for provider progress note. Note: Reviewed        Physical Exam  General appearance: Well nourished, well developed, and well  hydrated. In no apparent acute distress Mental status: Alert, oriented x 3 (person, place, & time)       Respiratory: No evidence of acute respiratory distress Eyes: PERLA Vitals: BP 123/81 (BP Location: Left Arm, Patient Position: Sitting, Cuff Size: Normal)   Pulse (!) 113   Temp (!) 97.3 F (36.3 C)   Resp 16   Ht 5' 3"  (1.6 m)   Wt 140 lb (63.5 kg)   SpO2 99%   BMI 24.80 kg/m  BMI: Estimated body mass index is 24.8 kg/m as calculated from the following:   Height as of this encounter: 5' 3"  (1.6 m).   Weight as of this encounter: 140 lb (63.5 kg). Ideal: Ideal body weight: 52.4 kg (115 lb 8.3 oz) Adjusted ideal body weight: 56.8 kg (125 lb 5 oz)  Assessment   Status Diagnosis  Controlled Controlled Controlled 1. Acute exacerbation of chronic low back pain   2. Fall, initial encounter   3. Chronic pain syndrome   4. Chronic low back pain (1ry area of Pain) (Bilateral)  (L>R) w/o sciatica   5. Chronic lower extremity pain (2ry area of Pain) (Left)   6. Chronic hip pain (3ry area of Pain) (Bilateral) (R>L)   7. Pharmacologic therapy   8. Chronic use of opiate for therapeutic purpose   9. Encounter for medication management   10. Bertolotti's syndrome (Left)   11. Chronic hip pain (Left)   12. Chronic lumbar radicular pain (Left) (L5 Dermatome)   13. Grade 1 spondylolisthesis of L4 over L5   14. Lumbar facet syndrome (Bilateral) (L>R)   15. Lumbar facet hypertrophy (Bilateral)   16. Severe (5-6 mm) L4-5 lumbar spinal stenosis and (8 mm) L3-4 spinal stenosis.      Updated Problems: Problem  Fall (07/19/2021)   The patient refers having had a fall in her bathroom on 07/19/2021 where she hit her back and flank on the left side.  She did not do any x-rays and she is having more pain than usual.     Plan of Care  Problem-specific:  No problem-specific Assessment & Plan notes found for this encounter.  Cynthia Bennett has a current medication list which includes the following long-term medication(s): bupropion, doxepin, oxycodone, [START ON 11/01/2021] oxycodone, [START ON 12/01/2021] oxycodone, pantoprazole, and zolpidem.  Pharmacotherapy (Medications Ordered): Meds ordered this encounter  Medications   oxyCODONE (OXY IR/ROXICODONE) 5 MG immediate release tablet    Sig: Take 1 tablet (5 mg total) by mouth every 8 (eight) hours as needed for severe pain. Must last 30 days    Dispense:  30 tablet    Refill:  0    DO NOT: delete (not duplicate); no partial-fill (will deny script to complete), no refill request (F/U required). DISPENSE: 1 day early if closed on fill date. WARN: No CNS-depressants within 8 hrs of med.   oxyCODONE (OXY IR/ROXICODONE) 5 MG immediate release tablet    Sig: Take 1 tablet (5 mg total) by mouth every 8 (eight) hours as needed for severe pain. Must last 30 days    Dispense:  30 tablet    Refill:  0    DO NOT: delete (not duplicate);  no partial-fill (will deny script to complete), no refill request (F/U required). DISPENSE: 1 day early if closed on fill date. WARN: No CNS-depressants within 8 hrs of med.   oxyCODONE (OXY IR/ROXICODONE) 5 MG immediate release tablet    Sig: Take 1 tablet (5 mg total) by mouth  every 8 (eight) hours as needed for severe pain. Must last 30 days    Dispense:  30 tablet    Refill:  0    DO NOT: delete (not duplicate); no partial-fill (will deny script to complete), no refill request (F/U required). DISPENSE: 1 day early if closed on fill date. WARN: No CNS-depressants within 8 hrs of med.    Orders:  Orders Placed This Encounter  Procedures   Lumbar Transforaminal Epidural    Standing Status:   Future    Standing Expiration Date:   01/02/2022    Scheduling Instructions:     Side: Left-sided     Level: L4     Sedation: Patient's choice.     Timeframe: ASAP    Order Specific Question:   Where will this procedure be performed?    Answer:   [facility]   LUMBAR FACET(MEDIAL BRANCH NERVE BLOCK) MBNB    Standing Status:   Future    Standing Expiration Date:   01/02/2022    Scheduling Instructions:     Procedure: Lumbar facet block (AKA.: Lumbosacral medial branch nerve block)     Side: Left-sided     Level: L3-4, L4-5, & L5-S1 Facets (L2, L3, L4, L5, & S1 Medial Branch Nerves)     Sedation: Patient's choice.     Timeframe: ASAA    Order Specific Question:   Where will this procedure be performed?    Answer:   [facility]   DG Lumbar Spine Complete W/Bend    Patient presents with axial pain with possible radicular component. Please assist [place] in identifying specific level(s) and laterality of any additional findings such as: 1. Facet (Zygapophyseal) joint DJD (Hypertrophy, space narrowing, subchondral sclerosis, and/or osteophyte formation) 2. DDD and/or IVDD (Loss of disc height, desiccation, gas patterns, osteophytes, endplate sclerosis, or "Black disc disease") 3. Pars  defects 4. Spondylolisthesis, spondylosis, and/or spondyloarthropathies (include Degree/Grade of displacement in mm) (stability) 5. Vertebral body Fractures (acute/chronic) (state percentage of collapse) 6. Demineralization (osteopenia/osteoporotic) 7. Bone pathology 8. Foraminal narrowing  9. Surgical changes    Standing Status:   Future    Standing Expiration Date:   11/01/2021    Scheduling Instructions:     Imaging must be done as soon as possible. Inform patient that order will expire within 30 days and I will not renew it.    Order Specific Question:   Reason for Exam (SYMPTOM  OR DIAGNOSIS REQUIRED)    Answer:   Low back pain    Order Specific Question:   Preferred imaging location?    Answer:   [facility]    Order Specific Question:   Call Results- Best Contact Number?    Answer:   [phone] ([facility])    Order Specific Question:   Radiology Contrast Protocol - do NOT remove file path    Answer:   [email or web address]    Order Specific Question:   Release to patient    Answer:   Immediate    Follow-up plan:   Return for (Clinic) procedure: (L) L-FCT Blk + (L) L4 TFESI.      Interventional Therapies  Risk  Complexity Considerations:   Estimated body mass index is 24.8 kg/m as calculated from the following:   Height as of this encounter: 5' 3"  (1.6 m).   Weight as of this encounter: 140 lb (63.5 kg). Advanced age   Planned  Pending:   Pending further evaluation   Under consideration:   Diagnostic  left L5 transverse process-sacral ala pseudoarthrosis injection.  If he ends up getting good relief of the pain, at least for the duration of the numbing medicine, she may be a good candidate for corrective surgery of the affected area. Palliative left lumbar facet block  Palliative left L4 transforaminal ESI  Diagnostic right lumbar facet block #1  Diagnostic right ischial bursa injection #1  Possible bilateral  lumbar facet RFA  Possible bilateral sacroiliac joint RFA  Diagnostic left L4 TFESI  Diagnostic left IA hip joint injection   Diagnostic bilateral femoral nerve + obturator NB  Possible bilateral femoral nerve + obturator nerve RFA    Completed:   Therapeutic left L4-5 LESI x6 (04/10/2020)  Therapeutic right L4-5 LESI x1 (07/21/2017)  Palliative right L5-S1 LESI x1 (09/27/2015)  Therapeutic left L5-S1 LESI x1 (02/05/2016)  Diagnostic right SI joint injection x2 (08/25/2019)  Diagnostic left SI joint injection x2 (01/27/2019)  Diagnostic left glenohumeral joint injection x1 (04/29/2017)  Left biceps muscle trigger point injection x1 (07/21/2017)  Right PSIS trigger point injection x1 (07/27/2018)  Diagnostic right lumbar facet block x1 (10/23/2020)  Diagnostic left lumbar facet block x2 (10/23/2020)  Diagnostic left IA hip injection x1 (01/27/2019)    Therapeutic  Palliative (PRN) options:   None established    Recent Visits Date Type Provider Dept  08/22/21 Office Visit Milinda Pointer, MD [facility]  07/30/21 Telemedicine Milinda Pointer, MD [facility]  Showing recent visits within past 90 days and meeting all other requirements Today's Visits Date Type Provider Dept  10/02/21 Office Visit Milinda Pointer, MD [facility]  Showing today's visits and meeting all other requirements Future Appointments No visits were found meeting these conditions. Showing future appointments within next 90 days and meeting all other requirements I discussed the assessment and treatment plan with the patient. The patient was provided an opportunity to ask questions and all were answered. The patient agreed with the plan and demonstrated an understanding of the instructions.  Patient advised to call back or seek an in-person evaluation if the symptoms or condition worsens.  Duration of encounter: 30 minutes.  Note by: Gaspar Cola, MD Date: 10/02/2021;  Time: 9:09 AM

## 2021-10-02 NOTE — Patient Instructions (Addendum)
____________________________________________________________________________________________  Medication Rules  Purpose: To inform patients, and their family members, of our rules and regulations.  Applies to: All patients receiving prescriptions (written or electronic).  Pharmacy of record: Pharmacy where electronic prescriptions will be sent. If written prescriptions are taken to a different pharmacy, please inform the nursing staff. The pharmacy listed in the electronic medical record should be the one where you would like electronic prescriptions to be sent.  Electronic prescriptions: In compliance with the [place] Strengthen Opioid Misuse Prevention (STOP) Act of 2017 (Session Law 2017-74/H243), effective November 10, 2018, all controlled substances must be electronically prescribed. Calling prescriptions to the pharmacy will cease to exist.  Prescription refills: Only during scheduled appointments. Applies to all prescriptions.  NOTE: The following applies primarily to controlled substances (Opioid* Pain Medications).   Type of encounter (visit): For patients receiving controlled substances, face-to-face visits are required. (Not an option or up to the patient.)  Patient's responsibilities: Pain Pills: Bring all pain pills to every appointment (except for procedure appointments). Pill Bottles: Bring pills in original pharmacy bottle. Always bring the newest bottle. Bring bottle, even if empty. Medication refills: You are responsible for knowing and keeping track of what medications you take and those you need refilled. The day before your appointment: write a list of all prescriptions that need to be refilled. The day of the appointment: give the list to the admitting nurse. Prescriptions will be written only during appointments. No prescriptions will be written on procedure days. If you forget a medication: it will not be "Called in", "Faxed", or "electronically sent". You will  need to get another appointment to get these prescribed. No early refills. Do not call asking to have your prescription filled early. Prescription Accuracy: You are responsible for carefully inspecting your prescriptions before leaving our office. Have the discharge nurse carefully go over each prescription with you, before taking them home. Make sure that your name is accurately spelled, that your address is correct. Check the name and dose of your medication to make sure it is accurate. Check the number of pills, and the written instructions to make sure they are clear and accurate. Make sure that you are given enough medication to last until your next medication refill appointment. Taking Medication: Take medication as prescribed. When it comes to controlled substances, taking less pills or less frequently than prescribed is permitted and encouraged. Never take more pills than instructed. Never take medication more frequently than prescribed.  Inform other Doctors: Always inform, all of your healthcare providers, of all the medications you take. Pain Medication from other Providers: You are not allowed to accept any additional pain medication from any other Doctor or Healthcare provider. There are two exceptions to this rule. (see below) In the event that you require additional pain medication, you are responsible for notifying us, as stated below. Cough Medicine: Often these contain an opioid, such as codeine or hydrocodone. Never accept or take cough medicine containing these opioids if you are already taking an opioid* medication. The combination may cause respiratory failure and death. Medication Agreement: You are responsible for carefully reading and following our Medication Agreement. This must be signed before receiving any prescriptions from our practice. Safely store a copy of your signed Agreement. Violations to the Agreement will result in no further prescriptions. (Additional copies of our  Medication Agreement are available upon request.) Laws, Rules, & Regulations: All patients are expected to follow all Federal and State Laws, Statutes, Rules, & Regulations. Ignorance of   the Laws does not constitute a valid excuse.  Illegal drugs and Controlled Substances: The use of illegal substances (including, but not limited to marijuana and its derivatives) and/or the illegal use of any controlled substances is strictly prohibited. Violation of this rule may result in the immediate and permanent discontinuation of any and all prescriptions being written by our practice. The use of any illegal substances is prohibited. Adopted CDC guidelines & recommendations: Target dosing levels will be at or below 60 MME/day. Use of benzodiazepines** is not recommended.  Exceptions: There are only two exceptions to the rule of not receiving pain medications from other Healthcare Providers. Exception #1 (Emergencies): In the event of an emergency (i.e.: accident requiring emergency care), you are allowed to receive additional pain medication. However, you are responsible for: As soon as you are able, call our office [phone], at any time of the day or night, and leave a message stating your name, the date and nature of the emergency, and the name and dose of the medication prescribed. In the event that your call is answered by a member of our staff, make sure to document and save the date, time, and the name of the person that took your information.  Exception #2 (Planned Surgery): In the event that you are scheduled by another doctor or dentist to have any type of surgery or procedure, you are allowed (for a period no longer than 30 days), to receive additional pain medication, for the acute post-op pain. However, in this case, you are responsible for picking up a copy of our "Post-op Pain Management for Surgeons" handout, and giving it to your surgeon or dentist. This document is available at our office, and  does not require an appointment to obtain it. Simply go to our office during business hours (Monday-Thursday from 8:00 AM to 4:00 PM) (Friday 8:00 AM to 12:00 Noon) or if you have a scheduled appointment with us, prior to your surgery, and ask for it by name. In addition, you are responsible for: calling our office [phone], at any time of the day or night, and leaving a message stating your name, name of your surgeon, type of surgery, and date of procedure or surgery. Failure to comply with your responsibilities may result in termination of therapy involving the controlled substances. Medication Agreement Violation. Following the above rules, including your responsibilities will help you in avoiding a Medication Agreement Violation ("Breaking your Pain Medication Contract").  *Opioid medications include: morphine, codeine, oxycodone, oxymorphone, hydrocodone, hydromorphone, meperidine, tramadol, tapentadol, buprenorphine, fentanyl, methadone. **Benzodiazepine medications include: diazepam (Valium), alprazolam (Xanax), clonazepam (Klonopine), lorazepam (Ativan), clorazepate (Tranxene), chlordiazepoxide (Librium), estazolam (Prosom), oxazepam (Serax), temazepam (Restoril), triazolam (Halcion) (Last updated: 08/07/2021) ____________________________________________________________________________________________  ____________________________________________________________________________________________  Medication Recommendations and Reminders  Applies to: All patients receiving prescriptions (written and/or electronic).  Medication Rules & Regulations: These rules and regulations exist for your safety and that of others. They are not flexible and neither are we. Dismissing or ignoring them will be considered "non-compliance" with medication therapy, resulting in complete and irreversible termination of such therapy. (See document titled "Medication Rules" for more details.) In all conscience,  because of safety reasons, we cannot continue providing a therapy where the patient does not follow instructions.  Pharmacy of record:  Definition: This is the pharmacy where your electronic prescriptions will be sent.  We do not endorse any particular pharmacy, however, we have experienced problems with [facility] not securing enough medication supply for the community. We do not restrict you   in your choice of pharmacy. However, once we write for your prescriptions, we will NOT be re-sending more prescriptions to fix restricted supply problems created by your pharmacy, or your insurance.  The pharmacy listed in the electronic medical record should be the one where you want electronic prescriptions to be sent. If you choose to change pharmacy, simply notify our nursing staff.  Recommendations: Keep all of your pain medications in a safe place, under lock and key, even if you live alone. We will NOT replace lost, stolen, or damaged medication. After you fill your prescription, take 1 week's worth of pills and put them away in a safe place. You should keep a separate, properly labeled bottle for this purpose. The remainder should be kept in the original bottle. Use this as your primary supply, until it runs out. Once it's gone, then you know that you have 1 week's worth of medicine, and it is time to come in for a prescription refill. If you do this correctly, it is unlikely that you will ever run out of medicine. To make sure that the above recommendation works, it is very important that you make sure your medication refill appointments are scheduled at least 1 week before you run out of medicine. To do this in an effective manner, make sure that you do not leave the office without scheduling your next medication management appointment. Always ask the nursing staff to show you in your prescription , when your medication will be running out. Then arrange for the receptionist to get you a return appointment,  at least 7 days before you run out of medicine. Do not wait until you have 1 or 2 pills left, to come in. This is very poor planning and does not take into consideration that we may need to cancel appointments due to bad weather, sickness, or emergencies affecting our staff. DO NOT ACCEPT A "Partial Fill": If for any reason your pharmacy does not have enough pills/tablets to completely fill or refill your prescription, do not allow for a "partial fill". The law allows the pharmacy to complete that prescription within 72 hours, without requiring a new prescription. If they do not fill the rest of your prescription within those 72 hours, you will need a separate prescription to fill the remaining amount, which we will NOT provide. If the reason for the partial fill is your insurance, you will need to talk to the pharmacist about payment alternatives for the remaining tablets, but again, DO NOT ACCEPT A PARTIAL FILL, unless you can trust your pharmacist to obtain the remainder of the pills within 72 hours.  Prescription refills and/or changes in medication(s):  Prescription refills, and/or changes in dose or medication, will be conducted only during scheduled medication management appointments. (Applies to both, written and electronic prescriptions.) No refills on procedure days. No medication will be changed or started on procedure days. No changes, adjustments, and/or refills will be conducted on a procedure day. Doing so will interfere with the diagnostic portion of the procedure. No phone refills. No medications will be "called into the pharmacy". No Fax refills. No weekend refills. No Holliday refills. No after hours refills.  Remember:  Business hours are:  Monday to Thursday 8:00 AM to 4:00 PM Provider's Schedule: Francisco Naveira, MD - Appointments are:  Medication management: Monday and Wednesday 8:00 AM to 4:00 PM Procedure day: Tuesday and Thursday 7:30 AM to 4:00 PM Bilal Lateef, MD -  Appointments are:  Medication management: Tuesday and Thursday 8:00   AM to 4:00 PM Procedure day: Monday and Wednesday 7:30 AM to 4:00 PM (Last update: 05/30/2020) ____________________________________________________________________________________________  ____________________________________________________________________________________________  CBD (cannabidiol) & Delta-8 (Delta-8 tetrahydrocannabinol) WARNING  Intro: Cannabidiol (CBD) and tetrahydrocannabinol (THC), are two natural compounds found in plants of the Cannabis genus. They can both be extracted from hemp or cannabis. Hemp and cannabis come from the Cannabis sativa plant. Both compounds interact with your body's endocannabinoid system, but they have very different effects. CBD does not produce the high sensation associated with cannabis. Delta-8 tetrahydrocannabinol, also known as delta-8 THC, is a psychoactive substance found in the Cannabis sativa plant, of which marijuana and hemp are two varieties. THC is responsible for the high associated with the illicit use of marijuana.  Applicable to: All individuals currently taking or considering taking CBD (cannabidiol) and, more important, all patients taking opioid analgesic controlled substances (pain medication). (Example: oxycodone; oxymorphone; hydrocodone; hydromorphone; morphine; methadone; tramadol; tapentadol; fentanyl; buprenorphine; butorphanol; dextromethorphan; meperidine; codeine; etc.)  Legal status: CBD remains a Schedule I drug prohibited for any use. CBD is illegal with one exception. In the [place], CBD has a limited Food and Drug Administration (FDA) approval for the treatment of two specific types of epilepsy disorders. Only one CBD product has been approved by the FDA for this purpose: "Epidiolex". FDA is aware that some companies are marketing products containing cannabis and cannabis-derived compounds in ways that violate the Federal Food, Drug and Cosmetic Act  (FD&C Act) and that may put the health and safety of consumers at risk. The FDA, a Federal agency, has not enforced the CBD status since 2018.   Legality: Some manufacturers ship CBD products nationally, which is illegal. Often such products are sold online and are therefore available throughout the country. CBD is openly sold in head shops and health food stores in some states where such sales have not been explicitly legalized. Selling unapproved products with unsubstantiated therapeutic claims is not only a violation of the law, but also can put patients at risk, as these products have not been proven to be safe or effective. Federal illegality makes it difficult to conduct research on CBD.  Reference: "FDA Regulation of Cannabis and Cannabis-Derived Products, Including Cannabidiol (CBD)" - https://www.fda.gov/news-events/public-health-focus/fda-regulation-cannabis-and-cannabis-derived-products-including-cannabidiol-cbd  Warning: CBD is not FDA approved and has not undergo the same manufacturing controls as prescription drugs.  This means that the purity and safety of available CBD may be questionable. Most of the time, despite manufacturer's claims, it is contaminated with THC (delta-9-tetrahydrocannabinol - the chemical in marijuana responsible for the "HIGH").  When this is the case, the THC contaminant will trigger a positive urine drug screen (UDS) test for Marijuana (carboxy-THC). Because a positive UDS for any illicit substance is a violation of our medication agreement, your opioid analgesics (pain medicine) may be permanently discontinued. The FDA recently put out a warning about 5 things that everyone should be aware of regarding Delta-8 THC: Delta-8 THC products have not been evaluated or approved by the FDA for safe use and may be marketed in ways that put the public health at risk. The FDA has received adverse event reports involving delta-8 THC-containing products. Delta-8 THC has  psychoactive and intoxicating effects. Delta-8 THC manufacturing often involve use of potentially harmful chemicals to create the concentrations of delta-8 THC claimed in the marketplace. The final delta-8 THC product may have potentially harmful by-products (contaminants) due to the chemicals used in the process. Manufacturing of delta-8 THC products may occur in uncontrolled or unsanitary settings, which may   lead to the presence of unsafe contaminants or other potentially harmful substances. Delta-8 THC products should be kept out of the reach of children and pets.  MORE ABOUT CBD  General Information: CBD was discovered in [birth year] and it is a derivative of the cannabis sativa genus plants (Marijuana and Hemp). It is one of the 113 identified substances found in Marijuana. It accounts for up to 40% of the plant's extract. As of 2018, preliminary clinical studies on CBD included research for the treatment of anxiety, movement disorders, and pain. CBD is available and consumed in multiple forms, including inhalation of smoke or vapor, as an aerosol spray, and by mouth. It may be supplied as an oil containing CBD, capsules, dried cannabis, or as a liquid solution. CBD is thought not to be as psychoactive as THC (delta-9-tetrahydrocannabinol - the chemical in marijuana responsible for the "HIGH"). Studies suggest that CBD may interact with different biological target receptors in the body, including cannabinoid and other neurotransmitter receptors. As of 2018 the mechanism of action for its biological effects has not been determined.  Side-effects  Adverse reactions: Dry mouth, diarrhea, decreased appetite, fatigue, drowsiness, malaise, weakness, sleep disturbances, and others.  Drug interactions: CBC may interact with other medications such as blood-thinners. (Last update: 08/09/2021) ____________________________________________________________________________________________   ____________________________________________________________________________________________  Drug Holidays (Slow)  What is a "Drug Holiday"? Drug Holiday: is the name given to the period of time during which a patient stops taking a medication(s) for the purpose of eliminating tolerance to the drug.  Benefits Improved effectiveness of opioids. Decreased opioid dose needed to achieve benefits. Improved pain with lesser dose.  What is tolerance? Tolerance: is the progressive decreased in effectiveness of a drug due to its repetitive use. With repetitive use, the body gets use to the medication and as a consequence, it loses its effectiveness. This is a common problem seen with opioid pain medications. As a result, a larger dose of the drug is needed to achieve the same effect that used to be obtained with a smaller dose.  How long should a "Drug Holiday" last? You should stay off of the pain medicine for at least 14 consecutive days. (2 weeks)  Should I stop the medicine "cold [place]"? No. You should always coordinate with your Pain Specialist so that he/she can provide you with the correct medication dose to make the transition as smoothly as possible.  How do I stop the medicine? Slowly. You will be instructed to decrease the daily amount of pills that you take by one (1) pill every seven (7) days. This is called a "slow downward taper" of your dose. For example: if you normally take four (4) pills per day, you will be asked to drop this dose to three (3) pills per day for seven (7) days, then to two (2) pills per day for seven (7) days, then to one (1) per day for seven (7) days, and at the end of those last seven (7) days, this is when the "Drug Holiday" would start.   Will I have withdrawals? By doing a "slow downward taper" like this one, it is unlikely that you will experience any significant withdrawal symptoms. Typically, what triggers withdrawals is the sudden stop of a high dose  opioid therapy. Withdrawals can usually be avoided by slowly decreasing the dose over a prolonged period of time. If you do not follow these instructions and decide to stop your medication abruptly, withdrawals may be possible.  What are withdrawals? Withdrawals: refers to  the wide range of symptoms that occur after stopping or dramatically reducing opiate drugs after heavy and prolonged use. Withdrawal symptoms do not occur to patients that use low dose opioids, or those who take the medication sporadically. Contrary to benzodiazepine (example: Valium, Xanax, etc.) or alcohol withdrawals ("Delirium Tremens"), opioid withdrawals are not lethal. Withdrawals are the physical manifestation of the body getting rid of the excess receptors.  Expected Symptoms Early symptoms of withdrawal may include: Agitation Anxiety Muscle aches Increased tearing Insomnia Runny nose Sweating Yawning  Late symptoms of withdrawal may include: Abdominal cramping Diarrhea Dilated pupils Goose bumps Nausea Vomiting  Will I experience withdrawals? Due to the slow nature of the taper, it is very unlikely that you will experience any.  What is a slow taper? Taper: refers to the gradual decrease in dose.  (Last update: 05/30/2020) ____________________________________________________________________________________________   Facet Blocks Patient Information  Description: The facets are joints in the spine between the vertebrae.  Like any joints in the body, facets can become irritated and painful.  Arthritis can also effect the facets.  By injecting steroids and local anesthetic in and around these joints, we can temporarily block the nerve supply to them.  Steroids act directly on irritated nerves and tissues to reduce selling and inflammation which often leads to decreased pain.  Facet blocks may be done anywhere along the spine from the neck to the low back depending upon the location of your pain.   After  numbing the skin with local anesthetic (like Novocaine), a small needle is passed onto the facet joints under x-ray guidance.  You may experience a sensation of pressure while this is being done.  The entire block usually lasts about 15-25 minutes.   Conditions which may be treated by facet blocks:  Low back/buttock pain Neck/shoulder pain Certain types of headaches  Preparation for the injection:  Do not eat any solid food or dairy products within 8 hours of your appointment. You may drink clear liquid up to 3 hours before appointment.  Clear liquids include water, black coffee, juice or soda.  No milk or cream please. You may take your regular medication, including pain medications, with a sip of water before your appointment.  Diabetics should hold regular insulin (if taken separately) and take 1/2 normal NPH dose the morning of the procedure.  Carry some sugar containing items with you to your appointment. A driver must accompany you and be prepared to drive you home after your procedure. Bring all your current medications with you. An IV may be inserted and sedation may be given at the discretion of the physician. A blood pressure cuff, EKG and other monitors will often be applied during the procedure.  Some patients may need to have extra oxygen administered for a short period. You will be asked to provide medical information, including your allergies and medications, prior to the procedure.  We must know immediately if you are taking blood thinners (like Coumadin/Warfarin) or if you are allergic to IV iodine contrast (dye).  We must know if you could possible be pregnant.  Possible side-effects:  Bleeding from needle site Infection (rare, may require surgery) Nerve injury (rare) Numbness & tingling (temporary) Difficulty urinating (rare, temporary) Spinal headache (a headache worse with upright posture) Light-headedness (temporary) Pain at injection site (serveral days) Decreased  blood pressure (rare, temporary) Weakness in arm/leg (temporary) Pressure sensation in back/neck (temporary)   Call if you experience:  Fever/chills associated with headache or increased back/neck pain Headache worsened  by an upright position New onset, weakness or numbness of an extremity below the injection site Hives or difficulty breathing (go to the emergency room) Inflammation or drainage at the injection site(s) Severe back/neck pain greater than usual New symptoms which are concerning to you  Please note:  Although the local anesthetic injected can often make your back or neck feel good for several hours after the injection, the pain will likely return. It takes 3-7 days for steroids to work.  You may not notice any pain relief for at least one week.  If effective, we will often do a series of 2-3 injections spaced 3-6 weeks apart to maximally decrease your pain.  After the initial series, you may be a candidate for a more permanent nerve block of the facets.  If you have any questions, please call #[phone]) [phone] [facility] Patient Information  Description: The epidural space surrounds the nerves as they exit the spinal cord.  In some patients, the nerves can be compressed and inflamed by a bulging disc or a tight spinal canal (spinal stenosis).  By injecting steroids into the epidural space, we can bring irritated nerves into direct contact with a potentially helpful medication.  These steroids act directly on the irritated nerves and can reduce swelling and inflammation which often leads to decreased pain.  Epidural steroids may be injected anywhere along the spine and from the neck to the low back depending upon the location of your pain.   After numbing the skin with local anesthetic (like Novocaine), a small needle is passed into the epidural space slowly.  You may experience a sensation of pressure while this is being  done.  The entire block usually last less than 10 minutes.  Conditions which may be treated by epidural steroids:  Low back and leg pain Neck and arm pain Spinal stenosis Post-laminectomy syndrome Herpes zoster (shingles) pain Pain from compression fractures  Preparation for the injection:  Do not eat any solid food or dairy products within 8 hours of your appointment.  You may drink clear liquids up to 3 hours before appointment.  Clear liquids include water, black coffee, juice or soda.  No milk or cream please. You may take your regular medication, including pain medications, with a sip of water before your appointment  Diabetics should hold regular insulin (if taken separately) and take 1/2 normal NPH dos the morning of the procedure.  Carry some sugar containing items with you to your appointment. A driver must accompany you and be prepared to drive you home after your procedure.  Bring all your current medications with your. An IV may be inserted and sedation may be given at the discretion of the physician.   A blood pressure cuff, EKG and other monitors will often be applied during the procedure.  Some patients may need to have extra oxygen administered for a short period. You will be asked to provide medical information, including your allergies, prior to the procedure.  We must know immediately if you are taking blood thinners (like Coumadin/Warfarin)  Or if you are allergic to IV iodine contrast (dye). We must know if you could possible be pregnant.  Possible side-effects: Bleeding from needle site Infection (rare, may require surgery) Nerve injury (rare) Numbness & tingling (temporary) Difficulty urinating (rare, temporary) Spinal headache ( a headache worse with upright posture) Light -headedness (temporary) Pain at injection site (several days) Decreased blood pressure (temporary) Weakness in arm/leg (temporary) Pressure sensation  in back/neck (temporary)  Call if you  experience: Fever/chills associated with headache or increased back/neck pain. Headache worsened by an upright position. New onset weakness or numbness of an extremity below the injection site Hives or difficulty breathing (go to the emergency room) Inflammation or drainage at the infection site Severe back/neck pain Any new symptoms which are concerning to you  Please note:  Although the local anesthetic injected can often make your back or neck feel good for several hours after the injection, the pain will likely return.  It takes 3-7 days for steroids to work in the epidural space.  You may not notice any pain relief for at least that one week.  If effective, we will often do a series of three injections spaced 3-6 weeks apart to maximally decrease your pain.  After the initial series, we generally will wait several months before considering a repeat injection of the same type.  If you have any questions, please call [phone] [facility]

## 2021-10-02 NOTE — Progress Notes (Signed)
Nursing Pain Medication Assessment:  Safety precautions to be maintained throughout the outpatient stay will include: orient to surroundings, keep bed in low position, maintain call bell within reach at all times, provide assistance with transfer out of bed and ambulation.  Medication Inspection Compliance: Pill count conducted under aseptic conditions, in front of the patient. Neither the pills nor the bottle was removed from the patient's sight at any time. Once count was completed pills were immediately returned to the patient in their original bottle.  Medication: Oxycodone IR Pill/Patch Count:  4 of 90 pills remain Pill/Patch Appearance: Markings consistent with prescribed medication Bottle Appearance: Standard pharmacy container. Clearly labeled. Filled Date: 09 / 11 / 2022 Last Medication intake:  Today

## 2021-10-18 DIAGNOSIS — H401133 Primary open-angle glaucoma, bilateral, severe stage: Secondary | ICD-10-CM | POA: Diagnosis not present

## 2021-12-17 DIAGNOSIS — H401133 Primary open-angle glaucoma, bilateral, severe stage: Secondary | ICD-10-CM | POA: Diagnosis not present

## 2021-12-24 NOTE — Progress Notes (Deleted)
No show

## 2021-12-25 ENCOUNTER — Encounter: Payer: PPO | Admitting: Pain Medicine

## 2021-12-31 NOTE — Progress Notes (Signed)
PROVIDER NOTE: Information contained herein reflects review and annotations entered in association with encounter. Interpretation of such information and data should be left to medically-trained personnel. Information provided to patient can be located elsewhere in the medical record under "Patient Instructions". Document created using STT-dictation technology, any transcriptional errors that may result from process are unintentional.    Patient: Cynthia Bennett  Service Category: E/M  Provider: Gaspar Cola, MD  DOB: Jul 03, 1933  DOS: 01/01/2022  Specialty: Interventional Pain Management  MRN: [id number]  Setting: Ambulatory outpatient  PCP: [name], MD  Type: Established Patient    Referring Provider: [name], MD  Location: Office  Delivery: Face-to-face     HPI  Ms. NICKISHA HUM, a 88 y.o. 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 Ms. Talsma primary complain today is Back Pain (Lumbar bilateral left is worse and is moving up the back. ) Last encounter: My last encounter with her was on 12/25/2021. Pertinent problems: Ms. Vanoverbeke has Fibromyalgia; Chronic pain syndrome; Lumbar radicular pain (Bilateral) (R>L); Chronic low back pain (1ry area of Pain) (Bilateral) (L>R) w/o sciatica; Grade 1 spondylolisthesis of L4 over L5; Lumbar discogenic pain syndrome; Lumbar facet syndrome (Bilateral) (L>R); Lumbar facet hypertrophy (Bilateral); Chronic sacroiliac joint pain (Left); Chronic hip pain (Left); Lumbar spondylosis; Abnormal MRI, lumbar spine (06/25/2016); Cervical spondylosis (Anterolisthesis of C5 over C6); Chronic lower extremity pain (2ry area of Pain) (Left); Chronic lumbar radicular pain (Left) (L5 Dermatome); Osteoarthritis of hip (Left); Chronic sacroiliac joint pain (Bilateral) (R>L); Chronic shoulder pain (Left); Osteoarthritis of shoulder (Left); Chronic sacroiliac joint pain (Right); Chronic hip pain (Right); Left upper arm pain; Myofascial pain  syndrome (left upper extremity); Acromioclavicular joint pain (Left); Osteoarthritis of AC (acromioclavicular) joint (Left); Supraspinatus tendon tear, sequela (Left); Spondylosis without myelopathy or radiculopathy, lumbosacral region; Other specified dorsopathies, sacral and sacrococcygeal region; Bertolotti's syndrome (Left); DDD (degenerative disc disease), lumbar; Severe (5-6 mm) L4-5 lumbar spinal stenosis and (8 mm) L3-4 spinal stenosis.; Osteoarthritis of hip (Right); Trigger point with back pain (Right); Chronic myofascial pain; Chronic low back pain (Right) w/o sciatica; Chronic hip pain (3ry area of Pain) (Bilateral) (R>L); Osteoarthritis of hips (Bilateral); Chronic low back pain (Left) w/o sciatica; Thoracic back pain (Left); DDD (degenerative disc disease), thoracic; DDD (degenerative disc disease), thoracolumbar; and Scoliosis of thoracolumbar region due to degenerative disease of spine in adult on their pertinent problem list. Pain Assessment: Severity of Chronic pain is reported as a 6 /10. Location: Back Lower, Left, Right/up the back on the left, down the left leg to the foot. Onset: More than a month ago. Quality: Discomfort, Constant, Numbness, Radiating. Timing: Constant. Modifying factor(s): heat, medications. Vitals:  height is _0  (1.6 m) and weight is 140 lb (63.5 kg). Her temporal temperature is 98.4 F (36.9 C). Her blood pressure is 114/86 and her pulse is 73. Her respiration is 16 and oxygen saturation is 94%.   Reason for encounter: medication management.   The patient indicates doing well with the current medication regimen. No adverse reactions or side effects reported to the medications.  The patient was last seen on 10/02/2021 at which time she had indicated having had a fall around September 9.  She denies having had any x-rays done after that fall and we had order some x-rays, which she did not have done.  At that time, we had's schedule the patient to return for a left L4  TFESI and a left lumbar facet MBB.  For some reason she did not come to either 1 of those.  She was scheduled for 12/25/2021, but she did not keep that appointment.  She returns today still having pain across her lower back, but more so on the left side.  She wants to come in for the facet blocks.  Currently she is not experiencing any pain down the leg and therefore no need to think about the left L4 transforaminal at this point.  RTCB: 04/01/2022  Pharmacotherapy Assessment  Analgesic: Oxycodone IR 5 mg, 1 tab PO 3 times daily (maximum of 15 mg/day of oxycodone.) MME/day:  22.5 mg/day.   Monitoring: [place] PMP: PDMP reviewed during this encounter.       Pharmacotherapy: No side-effects or adverse reactions reported. Compliance: No problems identified. Effectiveness: Clinically acceptable.  [name], RN  01/01/2022  1:08 PM  Sign when Signing Visit Nursing Pain Medication Assessment:  Safety precautions to be maintained throughout the outpatient stay will include: orient to surroundings, keep bed in low position, maintain call bell within reach at all times, provide assistance with transfer out of bed and ambulation.  Medication Inspection Compliance: Pill count conducted under aseptic conditions, in front of the patient. Neither the pills nor the bottle was removed from the patient's sight at any time. Once count was completed pills were immediately returned to the patient in their original bottle.  Medication: Oxycodone IR Pill/Patch Count:  7 of 30 pills remain Pill/Patch Appearance: Markings consistent with prescribed medication Bottle Appearance: Standard pharmacy container. Clearly labeled. Filled Date: 80 / 24 / 2022 Last Medication intake:  Today    UDS:  Summary  Date Value Ref Range Status  05/22/2021 Note  Final    Comment:    ==================================================================== [employer] 13  (MW) ==================================================================== Test                             Result       Flag       Units  Drug Present and Declared for Prescription Verification   Oxycodone                      120          EXPECTED   ng/mg creat   Noroxycodone                   1698         EXPECTED   ng/mg creat    Sources of oxycodone include scheduled prescription medications.    Noroxycodone is an expected metabolite of oxycodone.  ==================================================================== Test                      Result    Flag   Units      Ref Range   Creatinine              56               mg/dL      >=20 ==================================================================== Declared Medications:  The flagging and interpretation on this report are based on the  following declared medications.  Unexpected results may arise from  inaccuracies in the declared medications.   **Note: The testing scope of this panel includes these medications:   Oxycodone   **Note: The testing scope of this panel does not include the  following reported medications:   Acetaminophen (Tylenol)  Bupropion (Wellbutrin SR)  Cholecalciferol  Doxepin  Esomeprazole (Nexium)  Multivitamin  Ondansetron  Pantoprazole (Protonix)  Sertraline (Zoloft)  Vitamin B12  Zolpidem (Ambien) ==================================================================== For clinical consultation, please call [phone]. ====================================================================      ROS  Constitutional: Denies any fever or chills Gastrointestinal: No reported hemesis, hematochezia, vomiting, or acute GI distress Musculoskeletal: Denies any acute onset joint swelling, redness, loss of ROM, or weakness Neurological: No reported episodes of acute onset apraxia, aphasia, dysarthria, agnosia, amnesia, paralysis, loss of coordination, or loss of consciousness  Medication Review   Vitamin D3, acetaminophen, buPROPion, dorzolamide-timolol, doxepin, ondansetron, oxyCODONE, pantoprazole, sertraline, and zolpidem  History Review  Allergy: Ms. Gerstenberger is allergic to ibuprofen. Drug: Ms. Escalona  reports no history of drug use. Alcohol:  reports no history of alcohol use. Tobacco:  reports that she has quit smoking. She has never used smokeless tobacco. Social: Ms. Ullmer  reports that she has quit smoking. She has never used smokeless tobacco. She reports that she does not drink alcohol and does not use drugs. Medical:  has a past medical history of Dizzy spells, Fibromyalgia, Glaucoma, Hiatal hernia, Osteoarthritis, Osteoarthritis of spine with radiculopathy, lumbar region (08/14/2015), Peptic ulcer disease, Reflux, Spinal stenosis, Stroke ([place]), and TIA (transient ischemic attack). Surgical: Ms. Jewel  has a past surgical history that includes Esophagogastroduodenoscopy (N/A, 04/16/2015); Appendectomy; and Abdominal hysterectomy. Family: family history includes COPD in her father; Mental illness in her mother.  Laboratory Chemistry Profile   Renal Lab Results  Component Value Date   BUN 11 09/18/2021   CREATININE 0.94 09/18/2021   BCR 11 (L) 01/10/2019   GFRAA 46 (L) 01/10/2019   GFRNONAA 59 (L) 09/18/2021    Hepatic Lab Results  Component Value Date   AST 18 01/10/2019   ALT 11 (L) 06/24/2016   ALBUMIN 4.3 01/10/2019   ALKPHOS 74 01/10/2019   LIPASE 139 04/27/2014    Electrolytes Lab Results  Component Value Date   NA 135 09/18/2021   K 4.0 09/18/2021   CL 103 09/18/2021   CALCIUM 9.1 09/18/2021   MG 2.3 01/10/2019    Bone Lab Results  Component Value Date   25OHVITD1 26 (L) 01/10/2019   25OHVITD2 1.6 01/10/2019   25OHVITD3 24 01/10/2019    Inflammation (CRP: Acute Phase) (ESR: Chronic Phase) Lab Results  Component Value Date   CRP 0.9 08/24/2020   ESRSEDRATE 51 (H) 01/10/2019         Note: Above Lab results reviewed.  Recent Imaging Review  DG  PAIN CLINIC C-ARM 1-60 MIN NO REPORT Fluoro was used, but no Radiologist interpretation will be provided.  Please refer to "NOTES" tab for provider progress note. Note: Reviewed        Physical Exam  General appearance: Well nourished, well developed, and well hydrated. In no apparent acute distress Mental status: Alert, oriented x 3 (person, place, & time)       Respiratory: No evidence of acute respiratory distress Eyes: PERLA Vitals: BP 114/86 (BP Location: Right Arm, Patient Position: Sitting, Cuff Size: Normal)    Pulse 73    Temp 98.4 F (36.9 C) (Temporal)    Resp 16    Ht _0  (1.6 m)    Wt 140 lb (63.5 kg)    SpO2 94%    BMI 24.80 kg/m  BMI: Estimated body mass index is 24.8 kg/m as calculated from the following:   Height as of this encounter: _1  (1.6 m).   Weight as of this encounter: 140 lb (63.5  kg). Ideal: Ideal body weight: 52.4 kg (115 lb 8.3 oz) Adjusted ideal body weight: 56.8 kg (125 lb 5 oz)  Assessment   Status Diagnosis  Worsened Worsened Stable 1. Lumbar facet syndrome (Bilateral) (L>R)   2. Chronic low back pain (1ry area of Pain) (Bilateral) (L>R) w/o sciatica   3. Grade 1 spondylolisthesis of L4 over L5   4. Lumbar facet hypertrophy (Bilateral)   5. Fall, sequela   6. Chronic left-sided thoracic back pain   7. Chronic hip pain (3ry area of Pain) (Bilateral) (R>L)   8. Chronic lower extremity pain (2ry area of Pain) (Left)   9. Severe (5-6 mm) L4-5 lumbar spinal stenosis and (8 mm) L3-4 spinal stenosis.   10. Chronic pain syndrome   11. Pharmacologic therapy   12. Chronic use of opiate for therapeutic purpose   13. Encounter for medication management      Updated Problems: No problems updated.  Plan of Care  Problem-specific:  No problem-specific Assessment & Plan notes found for this encounter.  Ms. ZYIA KANEKO has a current medication list which includes the following long-term medication(s): bupropion, doxepin, oxycodone, [START ON  01/31/2022] oxycodone, [START ON 03/02/2022] oxycodone, pantoprazole, and zolpidem.  Pharmacotherapy (Medications Ordered): Meds ordered this encounter  Medications   oxyCODONE (OXY IR/ROXICODONE) 5 MG immediate release tablet    Sig: Take 1 tablet (5 mg total) by mouth every 8 (eight) hours as needed for severe pain. Must last 30 days    Dispense:  30 tablet    Refill:  0    DO NOT: delete (not duplicate); no partial-fill (will deny script to complete), no refill request (F/U required). DISPENSE: 1 day early if closed on fill date. WARN: No CNS-depressants within 8 hrs of med.   oxyCODONE (OXY IR/ROXICODONE) 5 MG immediate release tablet    Sig: Take 1 tablet (5 mg total) by mouth every 8 (eight) hours as needed for severe pain. Must last 30 days    Dispense:  30 tablet    Refill:  0    DO NOT: delete (not duplicate); no partial-fill (will deny script to complete), no refill request (F/U required). DISPENSE: 1 day early if closed on fill date. WARN: No CNS-depressants within 8 hrs of med.   oxyCODONE (OXY IR/ROXICODONE) 5 MG immediate release tablet    Sig: Take 1 tablet (5 mg total) by mouth every 8 (eight) hours as needed for severe pain. Must last 30 days    Dispense:  30 tablet    Refill:  0    DO NOT: delete (not duplicate); no partial-fill (will deny script to complete), no refill request (F/U required). DISPENSE: 1 day early if closed on fill date. WARN: No CNS-depressants within 8 hrs of med.   Orders:  Orders Placed This Encounter  Procedures   LUMBAR FACET(MEDIAL BRANCH NERVE BLOCK) MBNB    Standing Status:   Future    Standing Expiration Date:   03/31/2022    Scheduling Instructions:     Procedure: Lumbar facet block (AKA.: Lumbosacral medial branch nerve block)     Side: Bilateral     Level: L3-4 & L5-S1 Facets (L2, L3, L4, L5, & S1 Medial Branch Nerves)     Sedation: Patient's choice.     Timeframe: ASAA    Order Specific Question:   Where will this procedure be performed?     Answer:   [facility]   DG Lumbar Spine Complete W/Bend    Patient presents  with axial pain with possible radicular component. Please assist [place] in identifying specific level(s) and laterality of any additional findings such as: 1. Facet (Zygapophyseal) joint DJD (Hypertrophy, space narrowing, subchondral sclerosis, and/or osteophyte formation) 2. DDD and/or IVDD (Loss of disc height, desiccation, gas patterns, osteophytes, endplate sclerosis, or "Black disc disease") 3. Pars defects 4. Spondylolisthesis, spondylosis, and/or spondyloarthropathies (include Degree/Grade of displacement in mm) (stability) 5. Vertebral body Fractures (acute/chronic) (state percentage of collapse) 6. Demineralization (osteopenia/osteoporotic) 7. Bone pathology 8. Foraminal narrowing  9. Surgical changes    Standing Status:   Future    Standing Expiration Date:   01/29/2022    Scheduling Instructions:     Imaging must be done as soon as possible. Inform patient that order will expire within 30 days and I will not renew it.    Order Specific Question:   Reason for Exam (SYMPTOM  OR DIAGNOSIS REQUIRED)    Answer:   Low back pain    Order Specific Question:   Preferred imaging location?    Answer:   [facility]    Order Specific Question:   Call Results- Best Contact Number?    Answer:   [phone] ([facility])    Order Specific Question:   Radiology Contrast Protocol - do NOT remove file path    Answer:   [email or web address]    Order Specific Question:   Release to patient    Answer:   Immediate   DG Thoracic Spine 4V    Patient presents with axial pain with possible radicular component. Please assist [place] in identifying specific level(s) and laterality of any additional findings such as: 1. Facet (Zygapophyseal) joint DJD (Hypertrophy, space narrowing, subchondral sclerosis, and/or osteophyte formation) 2. DDD and/or IVDD (Loss of disc height,  desiccation, gas patterns, osteophytes, endplate sclerosis, or "Black disc disease") 3. Pars defects 4. Spondylolisthesis, spondylosis, and/or spondyloarthropathies (include Degree/Grade of displacement in mm) (stability) 5. Vertebral body Fractures (acute/chronic) (state percentage of collapse) 6. Demineralization (osteopenia/osteoporotic) 7. Bone pathology 8. Foraminal narrowing  9. Surgical changes    Standing Status:   Future    Standing Expiration Date:   03/31/2022    Order Specific Question:   Reason for Exam (SYMPTOM  OR DIAGNOSIS REQUIRED)    Answer:   Upper back pain and/or thoracic spine pain.    Order Specific Question:   Preferred imaging location?    Answer:   [facility]    Order Specific Question:   Call Results- Best Contact Number?    Answer:   [phone] ([facility])   Follow-up plan:   Return for (Clinic) procedure: (B) L-FCT BLK.     Interventional Therapies  Risk   Complexity Considerations:   Estimated body mass index is 24.8 kg/m as calculated from the following:   Height as of this encounter: _0  (1.6 m).   Weight as of this encounter: 140 lb (63.5 kg). Advanced age   Planned   Pending:   Therapeutic/palliative bilateral lumbar facet MBB    Under consideration:   Diagnostic left L5 transverse process-sacral ala pseudoarthrosis injection.  If he ends up getting good relief of the pain, at least for the duration of the numbing medicine, she may be a good candidate for corrective surgery of the affected area. Palliative left lumbar facet block  Palliative left L4 transforaminal ESI  Diagnostic right lumbar facet block #1  Diagnostic right ischial bursa injection #1  Possible bilateral lumbar facet RFA  Possible bilateral sacroiliac joint RFA  Diagnostic left L4 TFESI  Diagnostic left IA hip joint injection   Diagnostic bilateral femoral nerve + obturator NB  Possible bilateral femoral nerve + obturator nerve RFA    Completed:    Therapeutic left L4-5 LESI x6 (04/10/2020)  Therapeutic right L4-5 LESI x1 (07/21/2017)  Palliative right L5-S1 LESI x1 (09/27/2015)  Therapeutic left L5-S1 LESI x1 (02/05/2016)  Diagnostic right SI joint injection x2 (08/25/2019)  Diagnostic left SI joint injection x2 (01/27/2019)  Diagnostic left glenohumeral joint injection x1 (04/29/2017)  Left biceps muscle trigger point injection x1 (07/21/2017)  Right PSIS trigger point injection x1 (07/27/2018)  Diagnostic right lumbar facet block x1 (10/23/2020)  Diagnostic left lumbar facet block x2 (10/23/2020)  Diagnostic left IA hip injection x1 (01/27/2019)    Therapeutic   Palliative (PRN) options:   None established    Recent Visits No visits were found meeting these conditions. Showing recent visits within past 90 days and meeting all other requirements Today's Visits Date Type Provider Dept  01/01/22 Office Visit Milinda Pointer, MD [facility]  Showing today's visits and meeting all other requirements Future Appointments No visits were found meeting these conditions. Showing future appointments within next 90 days and meeting all other requirements  I discussed the assessment and treatment plan with the patient. The patient was provided an opportunity to ask questions and all were answered. The patient agreed with the plan and demonstrated an understanding of the instructions.  Patient advised to call back or seek an in-person evaluation if the symptoms or condition worsens.  Duration of encounter: 30 minutes.  Note by: Gaspar Cola, MD Date: 01/01/2022; Time: 1:44 PM

## 2022-01-01 ENCOUNTER — Ambulatory Visit
Admission: RE | Admit: 2022-01-01 | Discharge: 2022-01-01 | Disposition: A | Discharge status: Home / Self Care | Payer: PPO | Attending: Pain Medicine | Admitting: Pain Medicine

## 2022-01-01 ENCOUNTER — Ambulatory Visit
Admission: RE | Admit: 2022-01-01 | Discharge: 2022-01-01 | Disposition: A | Discharge status: Home / Self Care | Payer: PPO | Source: Ambulatory Visit | Attending: Pain Medicine | Admitting: Pain Medicine

## 2022-01-01 ENCOUNTER — Other Ambulatory Visit: Payer: Self-pay

## 2022-01-01 ENCOUNTER — Other Ambulatory Visit: Payer: Self-pay | Admitting: Pain Medicine

## 2022-01-01 ENCOUNTER — Ambulatory Visit: Payer: PPO | Admitting: Pain Medicine

## 2022-01-01 ENCOUNTER — Encounter: Payer: Self-pay | Admitting: Pain Medicine

## 2022-01-01 VITALS — BP 114/86 | HR 73 | Temp 98.4°F | Resp 16 | Ht 63.0 in | Wt 140.0 lb

## 2022-01-01 DIAGNOSIS — M79622 Pain in left upper arm: Secondary | ICD-10-CM | POA: Insufficient documentation

## 2022-01-01 DIAGNOSIS — Y939 Activity, unspecified: Secondary | ICD-10-CM | POA: Diagnosis not present

## 2022-01-01 DIAGNOSIS — M25552 Pain in left hip: Secondary | ICD-10-CM | POA: Insufficient documentation

## 2022-01-01 DIAGNOSIS — X58XXXS Exposure to other specified factors, sequela: Secondary | ICD-10-CM | POA: Insufficient documentation

## 2022-01-01 DIAGNOSIS — M4317 Spondylolisthesis, lumbosacral region: Secondary | ICD-10-CM | POA: Diagnosis not present

## 2022-01-01 DIAGNOSIS — M797 Fibromyalgia: Secondary | ICD-10-CM | POA: Insufficient documentation

## 2022-01-01 DIAGNOSIS — M48061 Spinal stenosis, lumbar region without neurogenic claudication: Secondary | ICD-10-CM | POA: Insufficient documentation

## 2022-01-01 DIAGNOSIS — M545 Low back pain, unspecified: Secondary | ICD-10-CM | POA: Insufficient documentation

## 2022-01-01 DIAGNOSIS — W19XXXS Unspecified fall, sequela: Secondary | ICD-10-CM

## 2022-01-01 DIAGNOSIS — M546 Pain in thoracic spine: Secondary | ICD-10-CM | POA: Insufficient documentation

## 2022-01-01 DIAGNOSIS — M47812 Spondylosis without myelopathy or radiculopathy, cervical region: Secondary | ICD-10-CM | POA: Insufficient documentation

## 2022-01-01 DIAGNOSIS — G894 Chronic pain syndrome: Secondary | ICD-10-CM | POA: Diagnosis not present

## 2022-01-01 DIAGNOSIS — M79605 Pain in left leg: Secondary | ICD-10-CM | POA: Insufficient documentation

## 2022-01-01 DIAGNOSIS — Z79891 Long term (current) use of opiate analgesic: Secondary | ICD-10-CM | POA: Insufficient documentation

## 2022-01-01 DIAGNOSIS — M25551 Pain in right hip: Secondary | ICD-10-CM | POA: Insufficient documentation

## 2022-01-01 DIAGNOSIS — S46012S Strain of muscle(s) and tendon(s) of the rotator cuff of left shoulder, sequela: Secondary | ICD-10-CM | POA: Insufficient documentation

## 2022-01-01 DIAGNOSIS — M47814 Spondylosis without myelopathy or radiculopathy, thoracic region: Secondary | ICD-10-CM | POA: Insufficient documentation

## 2022-01-01 DIAGNOSIS — G8929 Other chronic pain: Secondary | ICD-10-CM | POA: Insufficient documentation

## 2022-01-01 DIAGNOSIS — X58XXXA Exposure to other specified factors, initial encounter: Secondary | ICD-10-CM | POA: Insufficient documentation

## 2022-01-01 DIAGNOSIS — M4726 Other spondylosis with radiculopathy, lumbar region: Secondary | ICD-10-CM | POA: Insufficient documentation

## 2022-01-01 DIAGNOSIS — M8580 Other specified disorders of bone density and structure, unspecified site: Secondary | ICD-10-CM | POA: Diagnosis not present

## 2022-01-01 DIAGNOSIS — M47816 Spondylosis without myelopathy or radiculopathy, lumbar region: Secondary | ICD-10-CM | POA: Insufficient documentation

## 2022-01-01 DIAGNOSIS — M48062 Spinal stenosis, lumbar region with neurogenic claudication: Secondary | ICD-10-CM

## 2022-01-01 DIAGNOSIS — Y929 Unspecified place or not applicable: Secondary | ICD-10-CM | POA: Insufficient documentation

## 2022-01-01 DIAGNOSIS — M47817 Spondylosis without myelopathy or radiculopathy, lumbosacral region: Secondary | ICD-10-CM | POA: Diagnosis not present

## 2022-01-01 DIAGNOSIS — Z79899 Other long term (current) drug therapy: Secondary | ICD-10-CM | POA: Insufficient documentation

## 2022-01-01 DIAGNOSIS — M533 Sacrococcygeal disorders, not elsewhere classified: Secondary | ICD-10-CM | POA: Insufficient documentation

## 2022-01-01 DIAGNOSIS — M4319 Spondylolisthesis, multiple sites in spine: Secondary | ICD-10-CM | POA: Insufficient documentation

## 2022-01-01 DIAGNOSIS — M4186 Other forms of scoliosis, lumbar region: Secondary | ICD-10-CM | POA: Diagnosis not present

## 2022-01-01 DIAGNOSIS — M4316 Spondylolisthesis, lumbar region: Secondary | ICD-10-CM

## 2022-01-01 MED ORDER — OXYCODONE HCL 5 MG PO TABS: 5.0000 mg | ORAL_TABLET | Freq: Three times a day (TID) | ORAL | 0 refills | Status: DC | PRN

## 2022-01-01 NOTE — Progress Notes (Signed)
Nursing Pain Medication Assessment:  Safety precautions to be maintained throughout the outpatient stay will include: orient to surroundings, keep bed in low position, maintain call bell within reach at all times, provide assistance with transfer out of bed and ambulation.  Medication Inspection Compliance: Pill count conducted under aseptic conditions, in front of the patient. Neither the pills nor the bottle was removed from the patient's sight at any time. Once count was completed pills were immediately returned to the patient in their original bottle.  Medication: Oxycodone IR Pill/Patch Count:  7 of 30 pills remain Pill/Patch Appearance: Markings consistent with prescribed medication Bottle Appearance: Standard pharmacy container. Clearly labeled. Filled Date: 42 / 24 / 2022 Last Medication intake:  Today

## 2022-01-01 NOTE — Patient Instructions (Signed)
______________________________________________________________________  Preparing for Procedure with Sedation  NOTICE: Due to recent regulatory changes, starting on June 10, 2021, procedures requiring intravenous (IV) sedation will no longer be performed at the [facility].  These types of procedures are required to be performed at [facility] ambulatory surgery facility.  We are very sorry for the inconvenience.  Procedure appointments are limited to planned procedures: No Prescription Refills. No disability issues will be discussed. No medication changes will be discussed.  Instructions: Oral Intake: Do not eat or drink anything for at least 8 hours prior to your procedure. (Exception: Blood Pressure Medication. See below.) Transportation: A driver is required. You may not drive yourself after the procedure. Blood Pressure Medicine: Do not forget to take your blood pressure medicine with a sip of water the morning of the procedure. If your Diastolic (lower reading) is above 100 mmHg, elective cases will be cancelled/rescheduled. Blood thinners: These will need to be stopped for procedures. Notify our staff if you are taking any blood thinners. Depending on which one you take, there will be specific instructions on how and when to stop it. Diabetics on insulin: Notify the staff so that you can be scheduled 1st case in the morning. If your diabetes requires high dose insulin, take only  of your normal insulin dose the morning of the procedure and notify the staff that you have done so. Preventing infections: Shower with an antibacterial soap the morning of your procedure. Build-up your immune system: Take 1000 mg of Vitamin C with every meal (3 times a day) the day prior to your procedure. Antibiotics: Inform the staff if you have a condition or reason that requires you to take antibiotics before dental procedures. Pregnancy: If you are pregnant, call and cancel the procedure. Sickness: If  you have a cold, fever, or any active infections, call and cancel the procedure. Arrival: You must be in the facility at least 30 minutes prior to your scheduled procedure. Children: Do not bring children with you. Dress appropriately: Bring dark clothing that you would not mind if they get stained. Valuables: Do not bring any jewelry or valuables.  Reasons to call and reschedule or cancel your procedure: (Following these recommendations will minimize the risk of a serious complication.) Surgeries: Avoid having procedures within 2 weeks of any surgery. (Avoid for 2 weeks before or after any surgery). Flu Shots: Avoid having procedures within 2 weeks of a flu shots. (Avoid for 2 weeks before or after immunizations). Barium: Avoid having a procedure within 7-10 days after having had a radiological study involving the use of radiological contrast. (Myelograms, Barium swallow or enema study). Heart attacks: Avoid any elective procedures or surgeries for the initial 6 months after a "Myocardial Infarction" (Heart Attack). Blood thinners: It is imperative that you stop these medications before procedures. Let us know if you if you take any blood thinner.  Infection: Avoid procedures during or within two weeks of an infection (including chest colds or gastrointestinal problems). Symptoms associated with infections include: Localized redness, fever, chills, night sweats or profuse sweating, burning sensation when voiding, cough, congestion, stuffiness, runny nose, sore throat, diarrhea, nausea, vomiting, cold or Flu symptoms, recent or current infections. It is specially important if the infection is over the area that we intend to treat. Heart and lung problems: Symptoms that may suggest an active cardiopulmonary problem include: cough, chest pain, breathing difficulties or shortness of breath, dizziness, ankle swelling, uncontrolled high or unusually low blood pressure, and/or palpitations. If you are    experiencing any of these symptoms, cancel your procedure and contact your primary care physician for an evaluation.  Remember:  Regular Business hours are:  Monday to Thursday 8:00 AM to 4:00 PM  Provider's Schedule: Fadi Menter, MD:  Procedure days: Tuesday and Thursday 7:30 AM to 4:00 PM  Bilal Lateef, MD:  Procedure days: Monday and Wednesday 7:30 AM to 4:00 PM ______________________________________________________________________  ____________________________________________________________________________________________  General Risks and Possible Complications  Patient Responsibilities: It is important that you read this as it is part of your informed consent. It is our duty to inform you of the risks and possible complications associated with treatments offered to you. It is your responsibility as a patient to read this and to ask questions about anything that is not clear or that you believe was not covered in this document.  Patient's Rights: You have the right to refuse treatment. You also have the right to change your mind, even after initially having agreed to have the treatment done. However, under this last option, if you wait until the last second to change your mind, you may be charged for the materials used up to that point.  Introduction: Medicine is not an exact science. Everything in Medicine, including the lack of treatment(s), carries the potential for danger, harm, or loss (which is by definition: Risk). In Medicine, a complication is a secondary problem, condition, or disease that can aggravate an already existing one. All treatments carry the risk of possible complications. The fact that a side effects or complications occurs, does not imply that the treatment was conducted incorrectly. It must be clearly understood that these can happen even when everything is done following the highest safety standards.  No treatment: You can choose not to proceed with the  proposed treatment alternative. The "PRO(s)" would include: avoiding the risk of complications associated with the therapy. The "CON(s)" would include: not getting any of the treatment benefits. These benefits fall under one of three categories: diagnostic; therapeutic; and/or palliative. Diagnostic benefits include: getting information which can ultimately lead to improvement of the disease or symptom(s). Therapeutic benefits are those associated with the successful treatment of the disease. Finally, palliative benefits are those related to the decrease of the primary symptoms, without necessarily curing the condition (example: decreasing the pain from a flare-up of a chronic condition, such as incurable terminal cancer).  General Risks and Complications: These are associated to most interventional treatments. They can occur alone, or in combination. They fall under one of the following six (6) categories: no benefit or worsening of symptoms; bleeding; infection; nerve damage; allergic reactions; and/or death. No benefits or worsening of symptoms: In Medicine there are no guarantees, only probabilities. No healthcare provider can ever guarantee that a medical treatment will work, they can only state the probability that it may. Furthermore, there is always the possibility that the condition may worsen, either directly, or indirectly, as a consequence of the treatment. Bleeding: This is more common if the patient is taking a blood thinner, either prescription or over the counter (example: Goody Powders, Fish oil, Aspirin, Garlic, etc.), or if suffering a condition associated with impaired coagulation (example: Hemophilia, cirrhosis of the liver, low platelet counts, etc.). However, even if you do not have one on these, it can still happen. If you have any of these conditions, or take one of these drugs, make sure to notify your treating physician. Infection: This is more common in patients with a compromised  immune system, either due to disease (example:   diabetes, cancer, human immunodeficiency virus [HIV], etc.), or due to medications or treatments (example: therapies used to treat cancer and rheumatological diseases). However, even if you do not have one on these, it can still happen. If you have any of these conditions, or take one of these drugs, make sure to notify your treating physician. Nerve Damage: This is more common when the treatment is an invasive one, but it can also happen with the use of medications, such as those used in the treatment of cancer. The damage can occur to small secondary nerves, or to large primary ones, such as those in the spinal cord and brain. This damage may be temporary or permanent and it may lead to impairments that can range from temporary numbness to permanent paralysis and/or brain death. Allergic Reactions: Any time a substance or material comes in contact with our body, there is the possibility of an allergic reaction. These can range from a mild skin rash (contact dermatitis) to a severe systemic reaction (anaphylactic reaction), which can result in death. Death: In general, any medical intervention can result in death, most of the time due to an unforeseen complication. ____________________________________________________________________________________________ ____________________________________________________________________________________________  Medication Rules  Purpose: To inform patients, and their family members, of our rules and regulations.  Applies to: All patients receiving prescriptions (written or electronic).  Pharmacy of record: Pharmacy where electronic prescriptions will be sent. If written prescriptions are taken to a different pharmacy, please inform the nursing staff. The pharmacy listed in the electronic medical record should be the one where you would like electronic prescriptions to be sent.  Electronic prescriptions: In compliance  with the [place] Strengthen Opioid Misuse Prevention (STOP) Act of 2017 (Session Law 2017-74/H243), effective November 10, 2018, all controlled substances must be electronically prescribed. Calling prescriptions to the pharmacy will cease to exist.  Prescription refills: Only during scheduled appointments. Applies to all prescriptions.  NOTE: The following applies primarily to controlled substances (Opioid* Pain Medications).   Type of encounter (visit): For patients receiving controlled substances, face-to-face visits are required. (Not an option or up to the patient.)  Patient's responsibilities: Pain Pills: Bring all pain pills to every appointment (except for procedure appointments). Pill Bottles: Bring pills in original pharmacy bottle. Always bring the newest bottle. Bring bottle, even if empty. Medication refills: You are responsible for knowing and keeping track of what medications you take and those you need refilled. The day before your appointment: write a list of all prescriptions that need to be refilled. The day of the appointment: give the list to the admitting nurse. Prescriptions will be written only during appointments. No prescriptions will be written on procedure days. If you forget a medication: it will not be "Called in", "Faxed", or "electronically sent". You will need to get another appointment to get these prescribed. No early refills. Do not call asking to have your prescription filled early. Prescription Accuracy: You are responsible for carefully inspecting your prescriptions before leaving our office. Have the discharge nurse carefully go over each prescription with you, before taking them home. Make sure that your name is accurately spelled, that your address is correct. Check the name and dose of your medication to make sure it is accurate. Check the number of pills, and the written instructions to make sure they are clear and accurate. Make sure that you are given  enough medication to last until your next medication refill appointment. Taking Medication: Take medication as prescribed. When it comes to controlled substances, taking less   pills or less frequently than prescribed is permitted and encouraged. Never take more pills than instructed. Never take medication more frequently than prescribed.  Inform other Doctors: Always inform, all of your healthcare providers, of all the medications you take. Pain Medication from other Providers: You are not allowed to accept any additional pain medication from any other Doctor or Healthcare provider. There are two exceptions to this rule. (see below) In the event that you require additional pain medication, you are responsible for notifying us, as stated below. Cough Medicine: Often these contain an opioid, such as codeine or hydrocodone. Never accept or take cough medicine containing these opioids if you are already taking an opioid* medication. The combination may cause respiratory failure and death. Medication Agreement: You are responsible for carefully reading and following our Medication Agreement. This must be signed before receiving any prescriptions from our practice. Safely store a copy of your signed Agreement. Violations to the Agreement will result in no further prescriptions. (Additional copies of our Medication Agreement are available upon request.) Laws, Rules, & Regulations: All patients are expected to follow all Federal and State Laws, Statutes, Rules, & Regulations. Ignorance of the Laws does not constitute a valid excuse.  Illegal drugs and Controlled Substances: The use of illegal substances (including, but not limited to marijuana and its derivatives) and/or the illegal use of any controlled substances is strictly prohibited. Violation of this rule may result in the immediate and permanent discontinuation of any and all prescriptions being written by our practice. The use of any illegal substances is  prohibited. Adopted CDC guidelines & recommendations: Target dosing levels will be at or below 60 MME/day. Use of benzodiazepines** is not recommended.  Exceptions: There are only two exceptions to the rule of not receiving pain medications from other Healthcare Providers. Exception #1 (Emergencies): In the event of an emergency (i.e.: accident requiring emergency care), you are allowed to receive additional pain medication. However, you are responsible for: As soon as you are able, call our office [phone], at any time of the day or night, and leave a message stating your name, the date and nature of the emergency, and the name and dose of the medication prescribed. In the event that your call is answered by a member of our staff, make sure to document and save the date, time, and the name of the person that took your information.  Exception #2 (Planned Surgery): In the event that you are scheduled by another doctor or dentist to have any type of surgery or procedure, you are allowed (for a period no longer than 30 days), to receive additional pain medication, for the acute post-op pain. However, in this case, you are responsible for picking up a copy of our "Post-op Pain Management for Surgeons" handout, and giving it to your surgeon or dentist. This document is available at our office, and does not require an appointment to obtain it. Simply go to our office during business hours (Monday-Thursday from 8:00 AM to 4:00 PM) (Friday 8:00 AM to 12:00 Noon) or if you have a scheduled appointment with us, prior to your surgery, and ask for it by name. In addition, you are responsible for: calling our office [phone], at any time of the day or night, and leaving a message stating your name, name of your surgeon, type of surgery, and date of procedure or surgery. Failure to comply with your responsibilities may result in termination of therapy involving the controlled substances. Medication Agreement  Violation.   Following the above rules, including your responsibilities will help you in avoiding a Medication Agreement Violation ("Breaking your Pain Medication Contract").  *Opioid medications include: morphine, codeine, oxycodone, oxymorphone, hydrocodone, hydromorphone, meperidine, tramadol, tapentadol, buprenorphine, fentanyl, methadone. **Benzodiazepine medications include: diazepam (Valium), alprazolam (Xanax), clonazepam (Klonopine), lorazepam (Ativan), clorazepate (Tranxene), chlordiazepoxide (Librium), estazolam (Prosom), oxazepam (Serax), temazepam (Restoril), triazolam (Halcion) (Last updated: 08/07/2021) ____________________________________________________________________________________________  ____________________________________________________________________________________________  Medication Recommendations and Reminders  Applies to: All patients receiving prescriptions (written and/or electronic).  Medication Rules & Regulations: These rules and regulations exist for your safety and that of others. They are not flexible and neither are we. Dismissing or ignoring them will be considered "non-compliance" with medication therapy, resulting in complete and irreversible termination of such therapy. (See document titled "Medication Rules" for more details.) In all conscience, because of safety reasons, we cannot continue providing a therapy where the patient does not follow instructions.  Pharmacy of record:  Definition: This is the pharmacy where your electronic prescriptions will be sent.  We do not endorse any particular pharmacy, however, we have experienced problems with [facility] not securing enough medication supply for the community. We do not restrict you in your choice of pharmacy. However, once we write for your prescriptions, we will NOT be re-sending more prescriptions to fix restricted supply problems created by your pharmacy, or your insurance.  The pharmacy listed in  the electronic medical record should be the one where you want electronic prescriptions to be sent. If you choose to change pharmacy, simply notify our nursing staff.  Recommendations: Keep all of your pain medications in a safe place, under lock and key, even if you live alone. We will NOT replace lost, stolen, or damaged medication. After you fill your prescription, take 1 week's worth of pills and put them away in a safe place. You should keep a separate, properly labeled bottle for this purpose. The remainder should be kept in the original bottle. Use this as your primary supply, until it runs out. Once it's gone, then you know that you have 1 week's worth of medicine, and it is time to come in for a prescription refill. If you do this correctly, it is unlikely that you will ever run out of medicine. To make sure that the above recommendation works, it is very important that you make sure your medication refill appointments are scheduled at least 1 week before you run out of medicine. To do this in an effective manner, make sure that you do not leave the office without scheduling your next medication management appointment. Always ask the nursing staff to show you in your prescription , when your medication will be running out. Then arrange for the receptionist to get you a return appointment, at least 7 days before you run out of medicine. Do not wait until you have 1 or 2 pills left, to come in. This is very poor planning and does not take into consideration that we may need to cancel appointments due to bad weather, sickness, or emergencies affecting our staff. DO NOT ACCEPT A "Partial Fill": If for any reason your pharmacy does not have enough pills/tablets to completely fill or refill your prescription, do not allow for a "partial fill". The law allows the pharmacy to complete that prescription within 72 hours, without requiring a new prescription. If they do not fill the rest of your prescription  within those 72 hours, you will need a separate prescription to fill the remaining amount, which we will NOT provide. If   the reason for the partial fill is your insurance, you will need to talk to the pharmacist about payment alternatives for the remaining tablets, but again, DO NOT ACCEPT A PARTIAL FILL, unless you can trust your pharmacist to obtain the remainder of the pills within 72 hours.  Prescription refills and/or changes in medication(s):  Prescription refills, and/or changes in dose or medication, will be conducted only during scheduled medication management appointments. (Applies to both, written and electronic prescriptions.) No refills on procedure days. No medication will be changed or started on procedure days. No changes, adjustments, and/or refills will be conducted on a procedure day. Doing so will interfere with the diagnostic portion of the procedure. No phone refills. No medications will be "called into the pharmacy". No Fax refills. No weekend refills. No Holliday refills. No after hours refills.  Remember:  Business hours are:  Monday to Thursday 8:00 AM to 4:00 PM Provider's Schedule: Truitt Cruey, MD - Appointments are:  Medication management: Monday and Wednesday 8:00 AM to 4:00 PM Procedure day: Tuesday and Thursday 7:30 AM to 4:00 PM Bilal Lateef, MD - Appointments are:  Medication management: Tuesday and Thursday 8:00 AM to 4:00 PM Procedure day: Monday and Wednesday 7:30 AM to 4:00 PM (Last update: 05/30/2020) ____________________________________________________________________________________________  ____________________________________________________________________________________________  CBD (cannabidiol) & Delta-8 (Delta-8 tetrahydrocannabinol) WARNING  Intro: Cannabidiol (CBD) and tetrahydrocannabinol (THC), are two natural compounds found in plants of the Cannabis genus. They can both be extracted from hemp or cannabis. Hemp and cannabis come  from the Cannabis sativa plant. Both compounds interact with your body's endocannabinoid system, but they have very different effects. CBD does not produce the high sensation associated with cannabis. Delta-8 tetrahydrocannabinol, also known as delta-8 THC, is a psychoactive substance found in the Cannabis sativa plant, of which marijuana and hemp are two varieties. THC is responsible for the high associated with the illicit use of marijuana.  Applicable to: All individuals currently taking or considering taking CBD (cannabidiol) and, more important, all patients taking opioid analgesic controlled substances (pain medication). (Example: oxycodone; oxymorphone; hydrocodone; hydromorphone; morphine; methadone; tramadol; tapentadol; fentanyl; buprenorphine; butorphanol; dextromethorphan; meperidine; codeine; etc.)  Legal status: CBD remains a Schedule I drug prohibited for any use. CBD is illegal with one exception. In the [place], CBD has a limited Food and Drug Administration (FDA) approval for the treatment of two specific types of epilepsy disorders. Only one CBD product has been approved by the FDA for this purpose: "Epidiolex". FDA is aware that some companies are marketing products containing cannabis and cannabis-derived compounds in ways that violate the Federal Food, Drug and Cosmetic Act (FD&C Act) and that may put the health and safety of consumers at risk. The FDA, a Federal agency, has not enforced the CBD status since 2018.   Legality: Some manufacturers ship CBD products nationally, which is illegal. Often such products are sold online and are therefore available throughout the country. CBD is openly sold in head shops and health food stores in some states where such sales have not been explicitly legalized. Selling unapproved products with unsubstantiated therapeutic claims is not only a violation of the law, but also can put patients at risk, as these products have not been proven to be  safe or effective. Federal illegality makes it difficult to conduct research on CBD.  Reference: "FDA Regulation of Cannabis and Cannabis-Derived Products, Including Cannabidiol (CBD)" - https://www.fda.gov/news-events/public-health-focus/fda-regulation-cannabis-and-cannabis-derived-products-including-cannabidiol-cbd  Warning: CBD is not FDA approved and has not undergo the same manufacturing controls as prescription drugs.    This means that the purity and safety of available CBD may be questionable. Most of the time, despite manufacturer's claims, it is contaminated with THC (delta-9-tetrahydrocannabinol - the chemical in marijuana responsible for the "HIGH").  When this is the case, the THC contaminant will trigger a positive urine drug screen (UDS) test for Marijuana (carboxy-THC). Because a positive UDS for any illicit substance is a violation of our medication agreement, your opioid analgesics (pain medicine) may be permanently discontinued. The FDA recently put out a warning about 5 things that everyone should be aware of regarding Delta-8 THC: Delta-8 THC products have not been evaluated or approved by the FDA for safe use and may be marketed in ways that put the public health at risk. The FDA has received adverse event reports involving delta-8 THC-containing products. Delta-8 THC has psychoactive and intoxicating effects. Delta-8 THC manufacturing often involve use of potentially harmful chemicals to create the concentrations of delta-8 THC claimed in the marketplace. The final delta-8 THC product may have potentially harmful by-products (contaminants) due to the chemicals used in the process. Manufacturing of delta-8 THC products may occur in uncontrolled or unsanitary settings, which may lead to the presence of unsafe contaminants or other potentially harmful substances. Delta-8 THC products should be kept out of the reach of children and pets.  MORE ABOUT CBD  General Information: CBD was  discovered in 1940 and it is a derivative of the cannabis sativa genus plants (Marijuana and Hemp). It is one of the 113 identified substances found in Marijuana. It accounts for up to 40% of the plant's extract. As of 2018, preliminary clinical studies on CBD included research for the treatment of anxiety, movement disorders, and pain. CBD is available and consumed in multiple forms, including inhalation of smoke or vapor, as an aerosol spray, and by mouth. It may be supplied as an oil containing CBD, capsules, dried cannabis, or as a liquid solution. CBD is thought not to be as psychoactive as THC (delta-9-tetrahydrocannabinol - the chemical in marijuana responsible for the "HIGH"). Studies suggest that CBD may interact with different biological target receptors in the body, including cannabinoid and other neurotransmitter receptors. As of 2018 the mechanism of action for its biological effects has not been determined.  Side-effects  Adverse reactions: Dry mouth, diarrhea, decreased appetite, fatigue, drowsiness, malaise, weakness, sleep disturbances, and others.  Drug interactions: CBC may interact with other medications such as blood-thinners. (Last update: 08/09/2021) ____________________________________________________________________________________________  ____________________________________________________________________________________________  Drug Holidays (Slow)  What is a "Drug Holiday"? Drug Holiday: is the name given to the period of time during which a patient stops taking a medication(s) for the purpose of eliminating tolerance to the drug.  Benefits Improved effectiveness of opioids. Decreased opioid dose needed to achieve benefits. Improved pain with lesser dose.  What is tolerance? Tolerance: is the progressive decreased in effectiveness of a drug due to its repetitive use. With repetitive use, the body gets use to the medication and as a consequence, it loses its  effectiveness. This is a common problem seen with opioid pain medications. As a result, a larger dose of the drug is needed to achieve the same effect that used to be obtained with a smaller dose.  How long should a "Drug Holiday" last? You should stay off of the pain medicine for at least 14 consecutive days. (2 weeks)  Should I stop the medicine "cold turkey"? No. You should always coordinate with your Pain Specialist so that he/she can provide you with the correct   medication dose to make the transition as smoothly as possible.  How do I stop the medicine? Slowly. You will be instructed to decrease the daily amount of pills that you take by one (1) pill every seven (7) days. This is called a "slow downward taper" of your dose. For example: if you normally take four (4) pills per day, you will be asked to drop this dose to three (3) pills per day for seven (7) days, then to two (2) pills per day for seven (7) days, then to one (1) per day for seven (7) days, and at the end of those last seven (7) days, this is when the "Drug Holiday" would start.   Will I have withdrawals? By doing a "slow downward taper" like this one, it is unlikely that you will experience any significant withdrawal symptoms. Typically, what triggers withdrawals is the sudden stop of a high dose opioid therapy. Withdrawals can usually be avoided by slowly decreasing the dose over a prolonged period of time. If you do not follow these instructions and decide to stop your medication abruptly, withdrawals may be possible.  What are withdrawals? Withdrawals: refers to the wide range of symptoms that occur after stopping or dramatically reducing opiate drugs after heavy and prolonged use. Withdrawal symptoms do not occur to patients that use low dose opioids, or those who take the medication sporadically. Contrary to benzodiazepine (example: Valium, Xanax, etc.) or alcohol withdrawals ("Delirium Tremens"), opioid withdrawals are not  lethal. Withdrawals are the physical manifestation of the body getting rid of the excess receptors.  Expected Symptoms Early symptoms of withdrawal may include: Agitation Anxiety Muscle aches Increased tearing Insomnia Runny nose Sweating Yawning  Late symptoms of withdrawal may include: Abdominal cramping Diarrhea Dilated pupils Goose bumps Nausea Vomiting  Will I experience withdrawals? Due to the slow nature of the taper, it is very unlikely that you will experience any.  What is a slow taper? Taper: refers to the gradual decrease in dose.  (Last update: 05/30/2020) ____________________________________________________________________________________________    

## 2022-01-03 ENCOUNTER — Telehealth: Payer: Self-pay | Admitting: *Deleted

## 2022-01-03 NOTE — Telephone Encounter (Signed)
Call daughter Zella Ball  [phone]

## 2022-01-03 NOTE — Telephone Encounter (Signed)
Patient's daughter called, left message. I attempted to call her back, message left.

## 2022-01-03 NOTE — Telephone Encounter (Signed)
Only gets 30 tablets per month. Cynthia Bennett states Dr. Laban Emperor told her to take at least 2 per day.

## 2022-01-06 ENCOUNTER — Other Ambulatory Visit: Payer: Self-pay | Admitting: Pain Medicine

## 2022-01-06 DIAGNOSIS — Z79891 Long term (current) use of opiate analgesic: Secondary | ICD-10-CM

## 2022-01-06 DIAGNOSIS — G8929 Other chronic pain: Secondary | ICD-10-CM

## 2022-01-06 DIAGNOSIS — M545 Low back pain, unspecified: Secondary | ICD-10-CM

## 2022-01-06 DIAGNOSIS — M79605 Pain in left leg: Secondary | ICD-10-CM

## 2022-01-06 DIAGNOSIS — Z79899 Other long term (current) drug therapy: Secondary | ICD-10-CM

## 2022-01-06 DIAGNOSIS — G894 Chronic pain syndrome: Secondary | ICD-10-CM

## 2022-01-06 MED ORDER — OXYCODONE HCL 5 MG PO TABS: 5.0000 mg | ORAL_TABLET | Freq: Two times a day (BID) | ORAL | 0 refills | Status: DC | PRN

## 2022-01-06 NOTE — Telephone Encounter (Signed)
Robin notified that new scripts have been sent to pharmacy.

## 2022-01-08 ENCOUNTER — Other Ambulatory Visit: Payer: Self-pay | Admitting: *Deleted

## 2022-01-08 DIAGNOSIS — E611 Iron deficiency: Secondary | ICD-10-CM

## 2022-01-08 NOTE — Progress Notes (Signed)
PROVIDER NOTE: Interpretation of information contained herein should be left to medically-trained personnel. Specific patient instructions are provided elsewhere under "Patient Instructions" section of medical record. This document was created in part using STT-dictation technology, any transcriptional errors that may result from this process are unintentional.  Patient: Cynthia Bennett Type: Established DOB: [birth year] MRN: [id number] PCP: [name], MD  Service: Procedure DOS: 01/09/2022 Setting: Ambulatory Location: Ambulatory outpatient facility Delivery: Face-to-face Provider: Gaspar Cola, MD Specialty: Interventional Pain Management Specialty designation: 09 Location: Outpatient facility Ref. Prov.: [name], MD    Primary Reason for Visit: Interventional Pain Management Treatment. CC: Back Pain (Low and bilateral)   Procedure:           Type: Lumbar Facet, Medial Branch Block(s)  #R2 L3   Laterality: Bilateral  Level: L2, L3, L4, L5, & S1 Medial Branch Level(s). Injecting these levels blocks the L3-4 and L5-S1 lumbar facet joints. Imaging: Fluoroscopic guidance Anesthesia: Local anesthesia (1-2% Lidocaine) Anxiolysis: None                 Sedation: None. DOS: 01/09/2022 Performed by: Gaspar Cola, MD  Primary Purpose: Therapeutic Indications: Low back pain severe enough to impact quality of life or function. 1. Lumbar facet syndrome (Bilateral) (L>R)   2. Spondylosis without myelopathy or radiculopathy, lumbosacral region   3. Grade 1 spondylolisthesis of L4 over L5   4. Lumbar facet hypertrophy (Bilateral)   5. DDD (degenerative disc disease), lumbar   6. Chronic low back pain (1ry area of Pain) (Bilateral) (L>R) w/o sciatica   7. Abnormal MRI, lumbar spine (06/25/2016)    NAS-11 Pain score:   Pre-procedure: 10-Worst pain ever/10   Post-procedure: 0-No pain/10   Today Cynthia Bennett came in with her daughter and we have gone over the results of her  thoracic and lumbar x-ray.  Essentially what we were trying to find out is whether or not she had any new fractures after her fall.  We were glad to see that she did not.  She comes in today still having pain on both sides of her lower back with the left side being worse than today she says that the pain on the left side seems to be a little higher.  She showed me her back and it still seems to be within the thoracolumbar region.  She does have a levoscoliosis and her pain seems to be on the left side.    Position / Prep / Materials:  Position: Prone  Prep solution: DuraPrep (Iodine Povacrylex [0.7% available iodine] and Isopropyl Alcohol, 74% w/w) Area Prepped: Posterolateral Lumbosacral Spine (Wide prep: From the lower border of the scapula down to the end of the tailbone and from flank to flank.)  Materials:  Tray: Block Needle(s):  Type: Spinal  Gauge (G): 22  Length: 5-in Qty: 4  Pre-op H&P Assessment:  Cynthia Bennett is a 88 y.o. (year old), female patient, seen today for interventional treatment. She  has a past surgical history that includes Esophagogastroduodenoscopy (N/A, 04/16/2015); Appendectomy; and Abdominal hysterectomy. Cynthia Bennett has a current medication list which includes the following prescription(s): acetaminophen, bupropion, vitamin d3, dorzolamide-timolol, doxepin, ondansetron, oxycodone, oxycodone, oxycodone, pantoprazole, sertraline, and zolpidem. Her primarily concern today is the Back Pain (Low and bilateral)  Initial Vital Signs:  Pulse/HCG Rate: 61  Temp: (!) 97 F (36.1 C) Resp: 18 BP: 105/87 SpO2: 98 %  BMI: Estimated body mass index is 25.61 kg/m as calculated from the following:   Height as  of this encounter: 5\' 2"  (1.575 m).   Weight as of this encounter: 140 lb (63.5 kg).  Risk Assessment: Allergies: Reviewed. She is allergic to ibuprofen.  Allergy Precautions: None required Coagulopathies: Reviewed. None identified.  Blood-thinner therapy: None at this  time Active Infection(s): Reviewed. None identified. Cynthia Bennett is afebrile  Site Confirmation: Cynthia Bennett was asked to confirm the procedure and laterality before marking the site Procedure checklist: Completed Consent: Before the procedure and under the influence of no sedative(s), amnesic(s), or anxiolytics, the patient was informed of the treatment options, risks and possible complications. To fulfill our ethical and legal obligations, as recommended by the American Medical Association's Code of Ethics, I have informed the patient of my clinical impression; the nature and purpose of the treatment or procedure; the risks, benefits, and possible complications of the intervention; the alternatives, including doing nothing; the risk(s) and benefit(s) of the alternative treatment(s) or procedure(s); and the risk(s) and benefit(s) of doing nothing. The patient was provided information about the general risks and possible complications associated with the procedure. These may include, but are not limited to: failure to achieve desired goals, infection, bleeding, organ or nerve damage, allergic reactions, paralysis, and death. In addition, the patient was informed of those risks and complications associated to Spine-related procedures, such as failure to decrease pain; infection (i.e.: Meningitis, epidural or intraspinal abscess); bleeding (i.e.: epidural hematoma, subarachnoid hemorrhage, or any other type of intraspinal or peri-dural bleeding); organ or nerve damage (i.e.: Any type of peripheral nerve, nerve root, or spinal cord injury) with subsequent damage to sensory, motor, and/or autonomic systems, resulting in permanent pain, numbness, and/or weakness of one or several areas of the body; allergic reactions; (i.e.: anaphylactic reaction); and/or death. Furthermore, the patient was informed of those risks and complications associated with the medications. These include, but are not limited to: allergic  reactions (i.e.: anaphylactic or anaphylactoid reaction(s)); adrenal axis suppression; blood sugar elevation that in diabetics may result in ketoacidosis or comma; water retention that in patients with history of congestive heart failure may result in shortness of breath, pulmonary edema, and decompensation with resultant heart failure; weight gain; swelling or edema; medication-induced neural toxicity; particulate matter embolism and blood vessel occlusion with resultant organ, and/or nervous system infarction; and/or aseptic necrosis of one or more joints. Finally, the patient was informed that Medicine is not an exact science; therefore, there is also the possibility of unforeseen or unpredictable risks and/or possible complications that may result in a catastrophic outcome. The patient indicated having understood very clearly. We have given the patient no guarantees and we have made no promises. Enough time was given to the patient to ask questions, all of which were answered to the patient's satisfaction. Ms. Cohea has indicated that she wanted to continue with the procedure. Attestation: I, the ordering provider, attest that I have discussed with the patient the benefits, risks, side-effects, alternatives, likelihood of achieving goals, and potential problems during recovery for the procedure that I have provided informed consent. Date   Time: 01/09/2022  9:54 AM  Pre-Procedure Preparation:  Monitoring: As per clinic protocol. Respiration, ETCO2, SpO2, BP, heart rate and rhythm monitor placed and checked for adequate function Safety Precautions: Patient was assessed for positional comfort and pressure points before starting the procedure. Time-out: I initiated and conducted the "Time-out" before starting the procedure, as per protocol. The patient was asked to participate by confirming the accuracy of the "Time Out" information. Verification of the correct person, site, and procedure  were performed and  confirmed by me, the nursing staff, and the patient. "Time-out" conducted as per Joint Commission's Universal Protocol (UP.01.01.01). Time: 1023  Description of Procedure:          Laterality: Bilateral. The procedure was performed in identical fashion on both sides. Targeted Levels:  L2, L3, L4, L5, & S1 Medial Branch Level(s)  Safety Precautions: Aspiration looking for blood return was conducted prior to all injections. At no point did we inject any substances, as a needle was being advanced. Before injecting, the patient was told to immediately notify me if she was experiencing any new onset of "ringing in the ears, or metallic taste in the mouth". No attempts were made at seeking any paresthesias. Safe injection practices and needle disposal techniques used. Medications properly checked for expiration dates. SDV (single dose vial) medications used. After the completion of the procedure, all disposable equipment used was discarded in the proper designated medical waste containers. Local Anesthesia: Protocol guidelines were followed. The patient was positioned over the fluoroscopy table. The area was prepped in the usual manner. The time-out was completed. The target area was identified using fluoroscopy. A 12-in long, straight, sterile hemostat was used with fluoroscopic guidance to locate the targets for each level blocked. Once located, the skin was marked with an approved surgical skin marker. Once all sites were marked, the skin (epidermis, dermis, and hypodermis), as well as deeper tissues (fat, connective tissue and muscle) were infiltrated with a small amount of a short-acting local anesthetic, loaded on a 10cc syringe with a 25G, 1.5-in  Needle. An appropriate amount of time was allowed for local anesthetics to take effect before proceeding to the next step. Local Anesthetic: Lidocaine 2.0% The unused portion of the local anesthetic was discarded in the proper designated containers. Technical  description of process:  L2 Medial Branch Nerve Block (MBB): The target area for the L2 medial branch is at the junction of the postero-lateral aspect of the superior articular process and the superior, posterior, and medial edge of the transverse process of L3. Under fluoroscopic guidance, a Quincke needle was inserted until contact was made with os over the superior postero-lateral aspect of the pedicular shadow (target area). After negative aspiration for blood, 0.5 mL of the nerve block solution was injected without difficulty or complication. The needle was removed intact. L3 Medial Branch Nerve Block (MBB): The target area for the L3 medial branch is at the junction of the postero-lateral aspect of the superior articular process and the superior, posterior, and medial edge of the transverse process of L4. Under fluoroscopic guidance, a Quincke needle was inserted until contact was made with os over the superior postero-lateral aspect of the pedicular shadow (target area). After negative aspiration for blood, 0.5 mL of the nerve block solution was injected without difficulty or complication. The needle was removed intact. L4 Medial Branch Nerve Block (MBB): The target area for the L4 medial branch is at the junction of the postero-lateral aspect of the superior articular process and the superior, posterior, and medial edge of the transverse process of L5. Under fluoroscopic guidance, a Quincke needle was inserted until contact was made with os over the superior postero-lateral aspect of the pedicular shadow (target area). After negative aspiration for blood, 0.5 mL of the nerve block solution was injected without difficulty or complication. The needle was removed intact. L5 Medial Branch Nerve Block (MBB): The target area for the L5 medial branch is at the junction of the postero-lateral aspect  of the superior articular process and the superior, posterior, and medial edge of the sacral ala. Under  fluoroscopic guidance, a Quincke needle was inserted until contact was made with os over the superior postero-lateral aspect of the pedicular shadow (target area). After negative aspiration for blood, 0.5 mL of the nerve block solution was injected without difficulty or complication. The needle was removed intact. S1 Medial Branch Nerve Block (MBB): The target area for the S1 medial branch is at the posterior and inferior 6 o'clock position of the L5-S1 facet joint. Under fluoroscopic guidance, the Quincke needle inserted for the L5 MBB was redirected until contact was made with os over the inferior and postero aspect of the sacrum, at the 6 o' clock position under the L5-S1 facet joint (Target area). After negative aspiration for blood, 0.5 mL of the nerve block solution was injected without difficulty or complication. The needle was removed intact.  Once the entire procedure was completed, the treated area was cleaned, making sure to leave some of the prepping solution back to take advantage of its long term bactericidal properties.      Illustration of the posterior view of the lumbar spine and the posterior neural structures. Laminae of L2 through S1 are labeled. DPRL5, dorsal primary ramus of L5; DPRS1, dorsal primary ramus of S1; DPR3, dorsal primary ramus of L3; FJ, facet (zygapophyseal) joint L3-L4; I, inferior articular process of L4; LB1, lateral branch of dorsal primary ramus of L1; IAB, inferior articular branches from L3 medial branch (supplies L4-L5 facet joint); IBP, intermediate branch plexus; MB3, medial branch of dorsal primary ramus of L3; NR3, third lumbar nerve root; S, superior articular process of L5; SAB, superior articular branches from L4 (supplies L4-5 facet joint also); TP3, transverse process of L3.  Vitals:   01/09/22 0953 01/09/22 1020 01/09/22 1030 01/09/22 1035  BP: 105/87 (!) 123/59 (!) 116/53 130/63  Pulse: 61 63 (!) 57 (!) 59  Resp: 18 15 20 18   Temp: (!) 97 F  (36.1 C)     TempSrc: Temporal     SpO2: 98% 100% 99% 100%  Weight: 140 lb (63.5 kg)     Height: 5\' 2"  (1.575 m)        Start Time: 1023 hrs. End Time: 1033 hrs.  Imaging Guidance (Spinal):          Type of Imaging Technique: Fluoroscopy Guidance (Spinal) Indication(s): Assistance in needle guidance and placement for procedures requiring needle placement in or near specific anatomical locations not easily accessible without such assistance. Exposure Time: Please see nurses notes. Contrast: None used. Fluoroscopic Guidance: I was personally present during the use of fluoroscopy. "Tunnel Vision Technique" used to obtain the best possible view of the target area. Parallax error corrected before commencing the procedure. "Direction-depth-direction" technique used to introduce the needle under continuous pulsed fluoroscopy. Once target was reached, antero-posterior, oblique, and lateral fluoroscopic projection used confirm needle placement in all planes. Images permanently stored in EMR. Interpretation: No contrast injected. I personally interpreted the imaging intraoperatively. Adequate needle placement confirmed in multiple planes. Permanent images saved into the patient's record.  Antibiotic Prophylaxis:   Anti-infectives (From admission, onward)    None      Indication(s): None identified  Post-operative Assessment:  Post-procedure Vital Signs:  Pulse/HCG Rate: (!) 59 (nsr)  Temp: (!) 97 F (36.1 C) Resp: 18 BP: 130/63 SpO2: 100 %  EBL: None  Complications: No immediate post-treatment complications observed by team, or reported by patient.  Note: The  patient tolerated the entire procedure well. A repeat set of vitals were taken after the procedure and the patient was kept under observation following institutional policy, for this type of procedure. Post-procedural neurological assessment was performed, showing return to baseline, prior to discharge. The patient was provided  with post-procedure discharge instructions, including a section on how to identify potential problems. Should any problems arise concerning this procedure, the patient was given instructions to immediately contact us, at any time, without hesitation. In any case, we plan to contact the patient by telephone for a follow-up status report regarding this interventional procedure.  Comments:  No additional relevant information.  Plan of Care  Orders:  Orders Placed This Encounter  Procedures   LUMBAR FACET(MEDIAL BRANCH NERVE BLOCK) MBNB    Scheduling Instructions:     Procedure: Lumbar facet block (AKA.: Lumbosacral medial branch nerve block)     Side: Bilateral     Level: L3-4 & L5-S1 Facets (L2, L3, L4, L5, & S1 Medial Branch Nerves)     Sedation: Patient's choice.     Timeframe: Today    Order Specific Question:   Where will this procedure be performed?    Answer:   [facility]   DG PAIN [facility] C-ARM 1-60 MIN NO REPORT    Intraoperative interpretation by procedural physician at [facility].    Standing Status:   Standing    Number of Occurrences:   1    Order Specific Question:   Reason for exam:    Answer:   Assistance in needle guidance and placement for procedures requiring needle placement in or near specific anatomical locations not easily accessible without such assistance.   Informed Consent Details: Physician/Practitioner Attestation; Transcribe to consent form and obtain patient signature    Nursing Order: Transcribe to consent form and obtain patient signature. Note: Always confirm laterality of pain with Ms. Wessner, before procedure.    Order Specific Question:   Physician/Practitioner attestation of informed consent for procedure/surgical case    Answer:   I, the physician/practitioner, attest that I have discussed with the patient the benefits, risks, side effects, alternatives, likelihood of achieving goals and potential problems during recovery for the  procedure that I have provided informed consent.    Order Specific Question:   Procedure    Answer:   Lumbar Facet Block  under fluoroscopic guidance    Order Specific Question:   Physician/Practitioner performing the procedure    Answer:   Faraz Ponciano A. Dossie Arbour MD    Order Specific Question:   Indication/Reason    Answer:   Low Back Pain, with our without leg pain, due to Facet Joint Arthralgia (Joint Pain) Spondylosis (Arthritis of the Spine), without myelopathy or radiculopathy (Nerve Damage).   Provide equipment / supplies at bedside    "Block Tray" (Disposable   single use) Needle type: SpinalSpinal Amount/quantity: 4 Size: Regular (3.5-inch) Gauge: 22G    Standing Status:   Standing    Number of Occurrences:   1    Order Specific Question:   Specify    Answer:   Block Tray   Chronic Opioid Analgesic:  Oxycodone IR 5 mg, 1 tab PO 3 times daily (maximum of 15 mg/day of oxycodone.) MME/day:  22.5 mg/day.   Medications ordered for procedure: Meds ordered this encounter  Medications   lidocaine (XYLOCAINE) 2 % (with pres) injection 400 mg   pentafluoroprop-tetrafluoroeth (GEBAUERS) aerosol   ropivacaine (PF) 2 mg/mL (0.2%) (NAROPIN) injection 18 mL   triamcinolone  acetonide (KENALOG-40) injection 80 mg   Medications administered: We administered lidocaine, pentafluoroprop-tetrafluoroeth, ropivacaine (PF) 2 mg/mL (0.2%), and triamcinolone acetonide.  See the medical record for exact dosing, route, and time of administration.  Follow-up plan:   Return in about 2 weeks (around 01/23/2022) for Proc-day (T,Th), (F2F), (PPE).       Interventional Therapies  Risk   Complexity Considerations:   Estimated body mass index is 24.8 kg/m as calculated from the following:   Height as of this encounter: 5\' 3"  (1.6 m).   Weight as of this encounter: 140 lb (63.5 kg). Advanced age   Planned   Pending:      Under consideration:       Completed:   Diagnostic left L5-S1 pseudoarthrosis  inj. #1 (01/09/2022) (10/10 to 0/10)  Therapeutic left L4-5 LESI x8 (06/25/2021) (100/100/80/90)  Therapeutic right L4-5 LESI x1 (07/21/2017) (DNR-F/U)  Palliative right L5-S1 LESI x1 (09/27/2015) (100/100/90)  Therapeutic left L5-S1 LESI x1 (02/05/2016) (100/100/100/100)  Diagnostic right SI joint injection x2 (08/25/2019) (100/100/40/30)  Diagnostic left SI joint injection x2 (01/27/2019) (100/100/100/100)  Diagnostic left glenohumeral joint injection x1 (04/29/2017) (100/100/98/98)  Left biceps muscle trigger point injection x1 (07/21/2017) (DNR-F/U)  Right PSIS trigger point injection x1 (07/27/2018) (100/100/50/>50)  Diagnostic right lumbar facet MBB x3 (01/09/2022) (10/10 to 0/10)  Diagnostic left lumbar facet MBB x4 (01/09/2022) (10/10 to 0/10)  Diagnostic left IA hip injection x1 (01/27/2019) (100/100/100/100)    Therapeutic   Palliative (PRN) options:   None established    Recent Visits Date Type Provider Dept  01/01/22 Office Visit Milinda Pointer, MD [facility]  Showing recent visits within past 90 days and meeting all other requirements Today's Visits Date Type Provider Dept  01/09/22 Procedure visit Milinda Pointer, MD [facility]  Showing today's visits and meeting all other requirements Future Appointments Date Type Provider Dept  01/21/22 Appointment Milinda Pointer, MD [facility]  03/19/22 Appointment Milinda Pointer, MD [facility]  Showing future appointments within next 90 days and meeting all other requirements  Disposition: Discharge home  Discharge (Date   Time): 01/09/2022; 1040 hrs.   Primary Care Physician: [name], MD Location: [facility] Note by: Gaspar Cola, MD Date: 01/09/2022; Time: 11:11 AM  Disclaimer:  Medicine is not an Chief Strategy Officer. The only guarantee in medicine is that nothing is guaranteed. It is important to note that the decision to proceed with  this intervention was based on the information collected from the patient. The Data and conclusions were drawn from the patient's questionnaire, the interview, and the physical examination. Because the information was provided in large part by the patient, it cannot be guaranteed that it has not been purposely or unconsciously manipulated. Every effort has been made to obtain as much relevant data as possible for this evaluation. It is important to note that the conclusions that lead to this procedure are derived in large part from the available data. Always take into account that the treatment will also be dependent on availability of resources and existing treatment guidelines, considered by other Pain Management Practitioners as being common knowledge and practice, at the time of the intervention. For Medico-Legal purposes, it is also important to point out that variation in procedural techniques and pharmacological choices are the acceptable norm. The indications, contraindications, technique, and results of the above procedure should only be interpreted and judged by a Board-Certified Interventional Pain Specialist with extensive familiarity and expertise in the same exact  procedure and technique.

## 2022-01-09 ENCOUNTER — Ambulatory Visit
Admission: RE | Admit: 2022-01-09 | Discharge: 2022-01-09 | Disposition: A | Discharge status: Home / Self Care | Payer: PPO | Source: Ambulatory Visit | Attending: Pain Medicine | Admitting: Pain Medicine

## 2022-01-09 ENCOUNTER — Other Ambulatory Visit: Payer: Self-pay

## 2022-01-09 ENCOUNTER — Ambulatory Visit (HOSPITAL_BASED_OUTPATIENT_CLINIC_OR_DEPARTMENT_OTHER): Payer: PPO | Admitting: Pain Medicine

## 2022-01-09 ENCOUNTER — Encounter: Payer: Self-pay | Admitting: Pain Medicine

## 2022-01-09 VITALS — BP 130/63 | HR 59 | Temp 97.0°F | Resp 18 | Ht 62.0 in | Wt 140.0 lb

## 2022-01-09 DIAGNOSIS — Q7649 Other congenital malformations of spine, not associated with scoliosis: Secondary | ICD-10-CM | POA: Diagnosis not present

## 2022-01-09 DIAGNOSIS — M47816 Spondylosis without myelopathy or radiculopathy, lumbar region: Secondary | ICD-10-CM

## 2022-01-09 DIAGNOSIS — M5136 Other intervertebral disc degeneration, lumbar region: Secondary | ICD-10-CM | POA: Insufficient documentation

## 2022-01-09 DIAGNOSIS — M545 Low back pain, unspecified: Secondary | ICD-10-CM | POA: Diagnosis not present

## 2022-01-09 DIAGNOSIS — R937 Abnormal findings on diagnostic imaging of other parts of musculoskeletal system: Secondary | ICD-10-CM

## 2022-01-09 DIAGNOSIS — G8929 Other chronic pain: Secondary | ICD-10-CM | POA: Diagnosis not present

## 2022-01-09 DIAGNOSIS — M47817 Spondylosis without myelopathy or radiculopathy, lumbosacral region: Secondary | ICD-10-CM

## 2022-01-09 DIAGNOSIS — M4316 Spondylolisthesis, lumbar region: Secondary | ICD-10-CM | POA: Diagnosis not present

## 2022-01-09 MED ORDER — TRIAMCINOLONE ACETONIDE 40 MG/ML IJ SUSP
INTRAMUSCULAR | Status: AC
  Filled 2022-01-09: qty 1

## 2022-01-09 MED ORDER — LIDOCAINE HCL 2 % IJ SOLN
20.0000 mL | Freq: Once | INTRAMUSCULAR | Status: AC
  Administered 2022-01-09: 400 mg

## 2022-01-09 MED ORDER — ROPIVACAINE HCL 2 MG/ML IJ SOLN
INTRAMUSCULAR | Status: AC
  Filled 2022-01-09: qty 20

## 2022-01-09 MED ORDER — ROPIVACAINE HCL 2 MG/ML IJ SOLN
18.0000 mL | Freq: Once | INTRAMUSCULAR | Status: AC
  Administered 2022-01-09: 18 mL via PERINEURAL

## 2022-01-09 MED ORDER — LIDOCAINE HCL 2 % IJ SOLN
INTRAMUSCULAR | Status: AC
  Filled 2022-01-09: qty 20

## 2022-01-09 MED ORDER — PENTAFLUOROPROP-TETRAFLUOROETH EX AERO
INHALATION_SPRAY | Freq: Once | CUTANEOUS | Status: AC
  Administered 2022-01-09: 30 via TOPICAL
  Filled 2022-01-09: qty 116

## 2022-01-09 MED ORDER — TRIAMCINOLONE ACETONIDE 40 MG/ML IJ SUSP
80.0000 mg | Freq: Once | INTRAMUSCULAR | Status: AC
  Administered 2022-01-09: 80 mg

## 2022-01-09 NOTE — Progress Notes (Signed)
Safety precautions to be maintained throughout the outpatient stay will include: orient to surroundings, keep bed in low position, maintain call bell within reach at all times, provide assistance with transfer out of bed and ambulation.  

## 2022-01-10 ENCOUNTER — Telehealth: Payer: Self-pay

## 2022-01-10 NOTE — Telephone Encounter (Signed)
Post procedure follow up call.  Patient states she is doing well.  

## 2022-01-15 ENCOUNTER — Inpatient Hospital Stay: Payer: PPO | Admitting: Internal Medicine

## 2022-01-15 ENCOUNTER — Inpatient Hospital Stay: Payer: PPO | Attending: Internal Medicine

## 2022-01-15 ENCOUNTER — Inpatient Hospital Stay: Payer: PPO

## 2022-01-15 ENCOUNTER — Other Ambulatory Visit: Payer: Self-pay

## 2022-01-15 ENCOUNTER — Encounter: Payer: Self-pay | Admitting: Internal Medicine

## 2022-01-15 VITALS — BP 93/66 | HR 93 | Temp 98.7°F | Ht 62.0 in | Wt 142.3 lb

## 2022-01-15 DIAGNOSIS — K5900 Constipation, unspecified: Secondary | ICD-10-CM | POA: Diagnosis not present

## 2022-01-15 DIAGNOSIS — M549 Dorsalgia, unspecified: Secondary | ICD-10-CM | POA: Insufficient documentation

## 2022-01-15 DIAGNOSIS — D649 Anemia, unspecified: Secondary | ICD-10-CM | POA: Insufficient documentation

## 2022-01-15 DIAGNOSIS — Z836 Family history of other diseases of the respiratory system: Secondary | ICD-10-CM | POA: Diagnosis not present

## 2022-01-15 DIAGNOSIS — R350 Frequency of micturition: Secondary | ICD-10-CM

## 2022-01-15 DIAGNOSIS — R5383 Other fatigue: Secondary | ICD-10-CM | POA: Insufficient documentation

## 2022-01-15 DIAGNOSIS — Z818 Family history of other mental and behavioral disorders: Secondary | ICD-10-CM | POA: Insufficient documentation

## 2022-01-15 DIAGNOSIS — M2550 Pain in unspecified joint: Secondary | ICD-10-CM | POA: Diagnosis not present

## 2022-01-15 DIAGNOSIS — M545 Low back pain, unspecified: Secondary | ICD-10-CM | POA: Insufficient documentation

## 2022-01-15 DIAGNOSIS — Z87891 Personal history of nicotine dependence: Secondary | ICD-10-CM | POA: Diagnosis not present

## 2022-01-15 DIAGNOSIS — D631 Anemia in chronic kidney disease: Secondary | ICD-10-CM

## 2022-01-15 DIAGNOSIS — R531 Weakness: Secondary | ICD-10-CM | POA: Insufficient documentation

## 2022-01-15 DIAGNOSIS — M4180 Other forms of scoliosis, site unspecified: Secondary | ICD-10-CM | POA: Insufficient documentation

## 2022-01-15 DIAGNOSIS — G8929 Other chronic pain: Secondary | ICD-10-CM | POA: Insufficient documentation

## 2022-01-15 DIAGNOSIS — M8580 Other specified disorders of bone density and structure, unspecified site: Secondary | ICD-10-CM | POA: Diagnosis not present

## 2022-01-15 DIAGNOSIS — Z79899 Other long term (current) drug therapy: Secondary | ICD-10-CM | POA: Diagnosis not present

## 2022-01-15 DIAGNOSIS — E611 Iron deficiency: Secondary | ICD-10-CM

## 2022-01-15 DIAGNOSIS — N1832 Chronic kidney disease, stage 3b: Secondary | ICD-10-CM

## 2022-01-15 LAB — CBC WITH DIFFERENTIAL/PLATELET
Abs Immature Granulocytes: 0.16 10*3/uL — ABNORMAL HIGH (ref 0.00–0.07)
Basophils Absolute: 0.1 10*3/uL (ref 0.0–0.1)
Basophils Relative: 1 %
Eosinophils Absolute: 0.2 10*3/uL (ref 0.0–0.5)
Eosinophils Relative: 1 %
HCT: 37 % (ref 36.0–46.0)
Hemoglobin: 11.6 g/dL — ABNORMAL LOW (ref 12.0–15.0)
Immature Granulocytes: 1 %
Lymphocytes Relative: 14 %
Lymphs Abs: 2.3 10*3/uL (ref 0.7–4.0)
MCH: 25.8 pg — ABNORMAL LOW (ref 26.0–34.0)
MCHC: 31.4 g/dL (ref 30.0–36.0)
MCV: 82.4 fL (ref 80.0–100.0)
Monocytes Absolute: 1 10*3/uL (ref 0.1–1.0)
Monocytes Relative: 6 %
Neutro Abs: 12.2 10*3/uL — ABNORMAL HIGH (ref 1.7–7.7)
Neutrophils Relative %: 77 %
Platelets: 556 10*3/uL — ABNORMAL HIGH (ref 150–400)
RBC: 4.49 MIL/uL (ref 3.87–5.11)
RDW: 14.7 % (ref 11.5–15.5)
WBC: 15.9 10*3/uL — ABNORMAL HIGH (ref 4.0–10.5)
nRBC: 0 % (ref 0.0–0.2)

## 2022-01-15 LAB — IRON AND TIBC
Iron: 41 ug/dL (ref 28–170)
Saturation Ratios: 11 % (ref 10.4–31.8)
TIBC: 382 ug/dL (ref 250–450)
UIBC: 341 ug/dL

## 2022-01-15 LAB — BASIC METABOLIC PANEL
Anion gap: 7 (ref 5–15)
BUN: 24 mg/dL — ABNORMAL HIGH (ref 8–23)
CO2: 24 mmol/L (ref 22–32)
Calcium: 9.1 mg/dL (ref 8.9–10.3)
Chloride: 104 mmol/L (ref 98–111)
Creatinine, Ser: 1.06 mg/dL — ABNORMAL HIGH (ref 0.44–1.00)
GFR, Estimated: 51 mL/min — ABNORMAL LOW (ref >60)
Glucose, Bld: 133 mg/dL — ABNORMAL HIGH (ref 70–99)
Potassium: 3.8 mmol/L (ref 3.5–5.1)
Sodium: 135 mmol/L (ref 135–145)

## 2022-01-15 LAB — URINALYSIS, COMPLETE (UACMP) WITH MICROSCOPIC
Bacteria, UA: NONE SEEN
Bilirubin Urine: NEGATIVE
Glucose, UA: NEGATIVE mg/dL
Hgb urine dipstick: NEGATIVE
Ketones, ur: NEGATIVE mg/dL
Nitrite: NEGATIVE
Protein, ur: NEGATIVE mg/dL
Specific Gravity, Urine: 1.015 (ref 1.005–1.030)
pH: 5 (ref 5.0–8.0)

## 2022-01-15 LAB — FERRITIN: Ferritin: 12 ng/mL (ref 11–307)

## 2022-01-15 NOTE — Assessment & Plan Note (Addendum)
#   Anemia iron def/CKD stageIII : Hb-8-9 [MCV- 77] s/p IV iron infusion hemoglobin up to 11.6 today. Proceed with Venofer today. October 2022 iron saturation 15%.  Plan Venofer this week.  ? ?# Fatigue/weak- ? Increased frequency[?incontinence [no dysuria;no smell]- mild leucocytosis- check UA/culture.  ? ?# CKDstage III-stable. ? ?# Constipation: ? Narcotics-defer to PCP/pain clinic- stable.  ? ?# Chronic back pain/? scaitica [Dr.Neivera]-stable. ? ?pt [phone]-call ?# DISPOSITION:  ?# UA; and culture today; ?# this week Venofer today ?# follow up in 4 months- MD; ; labs- cbc/bmp/iron studies/ferritin-- possible IVvenofer- Dr.B ? ?Cc; Dr.Hedrick ? ?

## 2022-01-15 NOTE — Progress Notes (Signed)
C/o of weakness. Vertigo x3 weeks ago with nausea. Still feels like she's spinning when looking down. ? ? ? ? ?

## 2022-01-15 NOTE — Progress Notes (Signed)
[facility] NOTE  Patient Care Team: [name], MD as PCP - General (Family Medicine) Cammie Sickle, MD as Consulting Physician (Hematology and Oncology)  CHIEF COMPLAINTS/PURPOSE OF CONSULTATION: Anemia   HEMATOLOGY HISTORY  # CHRONIC NORMOCYTIC ANEMIA [since 2019-PCP- Hb 8-9; Iron sat- 5%;NO Ferritin; GFR- 43]EGD/colonoscopy-?2013-2104- pt declines procedures; Intolerance to p.o. iron. IV Venofer-  #CKD stage III/chronic pain back-pain clinic narcotics.  HISTORY OF PRESENTING ILLNESS: In a wheelchair.  Accompanied by daughter.  Cynthia Bennett 88 y.o.  female history of anemia secondary ?chronic kidney disease is here for follow-up.  Patient states that she feels weak.  Fatigue.  Lack of energy.  She recently had back injection with the pain physician.  Patient was to have improvement of her symptoms of fatigue post iron infusion.  Review of Systems  Constitutional:  Positive for malaise/fatigue. Negative for chills, diaphoresis and fever.  HENT:  Negative for nosebleeds and sore throat.   Eyes:  Negative for double vision.  Respiratory:  Negative for cough, hemoptysis, sputum production, shortness of breath and wheezing.   Cardiovascular:  Negative for chest pain, palpitations, orthopnea and leg swelling.  Gastrointestinal:  Negative for abdominal pain, blood in stool, constipation, diarrhea, heartburn, melena, nausea and vomiting.  Genitourinary:  Negative for dysuria, frequency and urgency.  Musculoskeletal:  Positive for back pain and joint pain.  Skin: Negative.  Negative for itching and rash.  Neurological:  Negative for dizziness, tingling, focal weakness, weakness and headaches.  Endo/Heme/Allergies:  Does not bruise/bleed easily.  Psychiatric/Behavioral:  Negative for depression. The patient is not nervous/anxious and does not have insomnia.    MEDICAL HISTORY:  Past Medical History:  Diagnosis Date   Dizzy spells    Fibromyalgia     Glaucoma    Hiatal hernia    Osteoarthritis    Osteoarthritis of spine with radiculopathy, lumbar region 08/14/2015   Peptic ulcer disease    Reflux    Spinal stenosis    Stroke ([place])    TIA (transient ischemic attack)     SURGICAL HISTORY: Past Surgical History:  Procedure Laterality Date   ABDOMINAL HYSTERECTOMY     APPENDECTOMY     ESOPHAGOGASTRODUODENOSCOPY N/A 04/16/2015   Procedure: ESOPHAGOGASTRODUODENOSCOPY (EGD);  Surgeon: Hulen Luster, MD;  Location: [facility] ENDOSCOPY;  Service: Gastroenterology;  Laterality: N/A;    SOCIAL HISTORY: Social History   Socioeconomic History   Marital status: Widowed    Spouse name: Not on file   Number of children: Not on file   Years of education: Not on file   Highest education level: Not on file  Occupational History   Not on file  Tobacco Use   Smoking status: Former   Smokeless tobacco: Never  Vaping Use   Vaping Use: Never used  Substance and Sexual Activity   Alcohol use: No    Alcohol/week: 0.0 standard drinks   Drug use: No   Sexual activity: Not on file  Other Topics Concern   Not on file  Social History Narrative   Pt lives in [place]; with son. Quit smoking in 1957. No alcohol. Designed clothes/ alterations.    Social Determinants of Health   Financial Resource Strain: Not on file  Food Insecurity: Not on file  Transportation Needs: Not on file  Physical Activity: Not on file  Stress: Not on file  Social Connections: Not on file  Intimate Partner Violence: Not on file    FAMILY HISTORY: Family History  Problem Relation Age of  Onset   Mental illness Mother    COPD Father     ALLERGIES:  is allergic to ibuprofen.  MEDICATIONS:  Current Outpatient Medications  Medication Sig Dispense Refill   acetaminophen (TYLENOL) 325 MG tablet Take 650 mg by mouth every 6 (six) hours as needed.     buPROPion (WELLBUTRIN SR) 150 MG 12 hr tablet TAKE 1 TABLET BY MOUTH ONCE DAILY     Cholecalciferol (VITAMIN D3) 20 MCG (800  UNIT) TABS Take by mouth 2 (two) times daily.     dorzolamide-timolol (COSOPT) 22.3-6.8 MG/ML ophthalmic solution Place 1 drop into both eyes in the morning and at bedtime.     doxepin (SINEQUAN) 100 MG capsule Take 100 mg by mouth at bedtime.     ondansetron (ZOFRAN) 8 MG tablet TAKE 1 TABLET BY MOUTH EVERY 8 HOURS AS NEEDED FOR NAUSEA AND VOMITING 45 tablet 1   oxyCODONE (OXY IR/ROXICODONE) 5 MG immediate release tablet Take 1 tablet (5 mg total) by mouth every 8 (eight) hours as needed for severe pain. Must last 30 days 30 tablet 0   oxyCODONE (OXY IR/ROXICODONE) 5 MG immediate release tablet Take 1 tablet (5 mg total) by mouth 2 (two) times daily as needed for severe pain. Must last 30 days 60 tablet 0   oxyCODONE (OXY IR/ROXICODONE) 5 MG immediate release tablet Take 1 tablet (5 mg total) by mouth 2 (two) times daily as needed for severe pain. Must last 30 days 60 tablet 0   sertraline (ZOLOFT) 100 MG tablet Take 100 mg by mouth daily.     zolpidem (AMBIEN) 10 MG tablet TAKE 1/2 TO 1 TABLET BY MOUTH EVERY NIGHT AT BEDTIME     esomeprazole (NEXIUM) 40 MG capsule Take 40 mg by mouth 2 (two) times daily.     No current facility-administered medications for this visit.      PHYSICAL EXAMINATION:   Vitals:   01/15/22 1321  BP: 93/66  Pulse: 93  Temp: 98.7 F (37.1 C)  SpO2: 99%   Filed Weights   01/15/22 1321  Weight: 142 lb 4.8 oz (64.5 kg)    Physical Exam HENT:     Head: Normocephalic and atraumatic.     Mouth/Throat:     Pharynx: No oropharyngeal exudate.  Eyes:     Pupils: Pupils are equal, round, and reactive to light.  Cardiovascular:     Rate and Rhythm: Normal rate and regular rhythm.  Pulmonary:     Effort: Pulmonary effort is normal. No respiratory distress.     Breath sounds: Normal breath sounds. No wheezing.  Abdominal:     General: Bowel sounds are normal. There is no distension.     Palpations: Abdomen is soft. There is no mass.     Tenderness: There is  no abdominal tenderness. There is no guarding or rebound.  Musculoskeletal:        General: No tenderness. Normal range of motion.     Cervical back: Normal range of motion and neck supple.  Skin:    General: Skin is warm.  Neurological:     Mental Status: She is alert and oriented to person, place, and time.  Psychiatric:        Mood and Affect: Affect normal.    LABORATORY DATA:  I have reviewed the data as listed Lab Results  Component Value Date   WBC 15.9 (H) 01/15/2022   HGB 11.6 (L) 01/15/2022   HCT 37.0 01/15/2022   MCV 82.4 01/15/2022  PLT 556 (H) 01/15/2022   Recent Labs    08/15/21 1018 09/18/21 1240 01/15/22 1250  NA 136 135 135  K 4.0 4.0 3.8  CL 104 103 104  CO2 24 23 24   GLUCOSE 109* 87 133*  BUN 17 11 24*  CREATININE 1.19* 0.94 1.06*  CALCIUM 9.1 9.1 9.1  GFRNONAA 45* 59* 51*     DG Thoracic Spine 2 View  Result Date: 01/03/2022 CLINICAL DATA:  Back pain. EXAM: THORACIC SPINE 2 VIEWS COMPARISON:  Thoracic spine radiograph dated 05/02/2021. FINDINGS: No acute fracture or subluxation of the thoracic spine. The bones are osteopenic. Multilevel degenerative changes with similar scoliosis as the prior radiograph. The soft tissues are unremarkable. Atherosclerotic calcification of the aortic arch. IMPRESSION: 1. No acute thoracic spine pathology. 2. Osteopenia with degenerative changes similar to prior radiograph. Electronically Signed   By: Anner Crete M.D.   On: 01/03/2022 03:38   DG Lumbar Spine Complete W/Bend  Result Date: 01/03/2022 CLINICAL DATA:  Low back pain. EXAM: LUMBAR SPINE - COMPLETE WITH BENDING VIEWS COMPARISON:  Lumbar spine radiograph dated 10/15/2020. FINDINGS: Six lumbar type vertebra. The lowermost vertebra is numbered as S1. No acute fracture or subluxation of the lumbar spine. The bones are osteopenic. Multilevel degenerative changes with disc space narrowing endplate irregularity and spurring. There is similar levoscoliosis  centered at L3. grade 1 L5-S1 anterolisthesis similar to prior radiograph. No significant change in alignment in flexion or extension. Multilevel facet arthropathy. The visualized posterior elements are otherwise intact. There is atherosclerotic calcification of the abdominal aorta. The soft tissues are unremarkable. IMPRESSION: 1. No acute fracture or subluxation of the lumbar spine. 2. Osteopenia with multilevel degenerative changes and grade 1 L5-S1 anterolisthesis. 3. Levoscoliosis. Electronically Signed   By: Anner Crete M.D.   On: 01/03/2022 03:38   DG PAIN CLINIC C-ARM 1-60 MIN NO REPORT  Result Date: 01/09/2022 Fluoro was used, but no Radiologist interpretation will be provided. Please refer to "NOTES" tab for provider progress note.   Anemia due to stage 3b chronic kidney disease ([place]) # Anemia iron def/CKD stageIII : Hb-8-9 [MCV- 77] s/p IV iron infusion hemoglobin up to 11.6 today. Proceed with Venofer today. October 2022 iron saturation 15%.  Plan Venofer this week.   # Fatigue/weak- ? Increased frequency[?incontinence [no dysuria;no smell]- mild leucocytosis- check UA/culture.   # CKDstage III-stable.  # Constipation: ? Narcotics-defer to PCP/pain clinic- stable.   # Chronic back pain/? scaitica [Dr.Neivera]-stable.  pt [phone]-call # DISPOSITION:  # UA; and culture today; # this week Venofer today # follow up in 4 months- MD; ; labs- cbc/bmp/iron studies/ferritin-- possible IVvenofer- Dr.B  Cc; Dr.Hedrick     All questions were answered. The patient knows to call the clinic with any problems, questions or concerns.      Cammie Sickle, MD 01/15/2022 3:59 PM

## 2022-01-20 LAB — URINE CULTURE: Culture: >=100000 — AB

## 2022-01-21 ENCOUNTER — Encounter: Payer: Self-pay | Admitting: Pain Medicine

## 2022-01-21 ENCOUNTER — Ambulatory Visit: Payer: PPO | Attending: Pain Medicine | Admitting: Pain Medicine

## 2022-01-21 ENCOUNTER — Encounter: Payer: Self-pay | Admitting: Internal Medicine

## 2022-01-21 ENCOUNTER — Telehealth: Payer: Self-pay | Admitting: Internal Medicine

## 2022-01-21 ENCOUNTER — Inpatient Hospital Stay: Payer: PPO

## 2022-01-21 ENCOUNTER — Other Ambulatory Visit: Payer: Self-pay

## 2022-01-21 VITALS — BP 108/54 | HR 66 | Temp 99.6°F | Resp 19

## 2022-01-21 DIAGNOSIS — G894 Chronic pain syndrome: Secondary | ICD-10-CM

## 2022-01-21 DIAGNOSIS — E611 Iron deficiency: Secondary | ICD-10-CM

## 2022-01-21 DIAGNOSIS — Q7649 Other congenital malformations of spine, not associated with scoliosis: Secondary | ICD-10-CM | POA: Diagnosis not present

## 2022-01-21 DIAGNOSIS — M545 Low back pain, unspecified: Secondary | ICD-10-CM

## 2022-01-21 DIAGNOSIS — M47816 Spondylosis without myelopathy or radiculopathy, lumbar region: Secondary | ICD-10-CM

## 2022-01-21 DIAGNOSIS — D649 Anemia, unspecified: Secondary | ICD-10-CM | POA: Diagnosis not present

## 2022-01-21 DIAGNOSIS — G8929 Other chronic pain: Secondary | ICD-10-CM

## 2022-01-21 DIAGNOSIS — M79605 Pain in left leg: Secondary | ICD-10-CM | POA: Diagnosis not present

## 2022-01-21 MED ORDER — SODIUM CHLORIDE 0.9 % IV SOLN
Freq: Once | INTRAVENOUS | Status: AC
  Filled 2022-01-21: qty 250

## 2022-01-21 MED ORDER — AMOXICILLIN 500 MG PO CAPS: 500.0000 mg | ORAL_CAPSULE | Freq: Three times a day (TID) | ORAL | 0 refills | Status: DC

## 2022-01-21 MED ORDER — IRON SUCROSE 20 MG/ML IV SOLN
200.0000 mg | Freq: Once | INTRAVENOUS | Status: AC
  Administered 2022-01-21: 200 mg via INTRAVENOUS

## 2022-01-21 MED ORDER — SODIUM CHLORIDE 0.9 % IV SOLN: 200.0000 mg | Freq: Once | INTRAVENOUS | Status: DC

## 2022-01-21 NOTE — Progress Notes (Signed)
Informed patient/daughter that urine culture did grow bacteria and Dr. B sent in rx for Amoxicillin to [facility].Marland Kitchen  Confirmed with patient that she does not have an allergy to Penicillin.   ?

## 2022-01-21 NOTE — Patient Instructions (Signed)

## 2022-01-21 NOTE — Progress Notes (Signed)
Patient: Cynthia Bennett  Service Category: E/M  Provider: Gaspar Cola, MD  ?DOB: [birth year]  DOS: 01/21/2022  Location: Office  ?MRN: [id number]  Setting: Ambulatory outpatient  Referring Provider: [name], MD  ?Type: Established Patient  Specialty: Interventional Pain Management  PCP: [name], MD  ?Location: Remote location  Delivery: TeleHealth    ? ?Virtual Encounter - Pain Management ?PROVIDER NOTE: Information contained herein reflects review and annotations entered in association with encounter. Interpretation of such information and data should be left to medically-trained personnel. Information provided to patient can be located elsewhere in the medical record under "Patient Instructions". Document created using STT-dictation technology, any transcriptional errors that Cynthia result from process are unintentional.  ?  ?Contact & Pharmacy ?Preferred: [phone] ?Home: [phone] (home) ?Mobile: [phone] (mobile) ?E-mail: [name]  ?[facility] ?[address] [address] ?[place] [address] ?Phone: [phone] Fax: [phone] ?  ?Pre-screening  ?Cynthia Bennett offered "in-person" vs "virtual" encounter. She indicated preferring virtual for this encounter.  ? ?Reason ?COVID-19*  Social distancing based on CDC and AMA recommendations.  ? ?I contacted Madysyn Hanken Bennett on 01/21/2022 via telephone.      I clearly identified myself as Cynthia Cola, MD. I verified that I was speaking with the correct person using two identifiers (Name: Cynthia Bennett, and date of birth: Cynthia Bennett). ? ?Consent ?I sought verbal advanced consent from Cynthia Bennett for virtual visit interactions. I informed Cynthia Bennett of possible security and privacy concerns, risks, and limitations associated with providing "not-in-person" medical evaluation and management services. I also informed Cynthia Bennett of the availability of "in-person" appointments. Finally, I informed her that there would be a  charge for the virtual visit and that she could be  personally, fully or partially, financially responsible for it. Cynthia Bennett expressed understanding and agreed to proceed.  ? ?Historic Elements   ?Cynthia Bennett is a 88 y.o. year old, female patient evaluated today after our last contact on 01/09/2022. Cynthia Bennett  has a past medical history of Dizzy spells, Fibromyalgia, Glaucoma, Hiatal hernia, Osteoarthritis, Osteoarthritis of spine with radiculopathy, lumbar region (08/14/2015), Peptic ulcer disease, Reflux, Spinal stenosis, Stroke ([place]), and TIA (transient ischemic attack). She also  has a past surgical history that includes Esophagogastroduodenoscopy (N/A, 04/16/2015); Appendectomy; and Abdominal hysterectomy. Cynthia Bennett has a current medication list which includes the following prescription(s): acetaminophen, bupropion, vitamin d3, dorzolamide-timolol, doxepin, esomeprazole, ondansetron, oxycodone, oxycodone, oxycodone, sertraline, and zolpidem. She  reports that she has quit smoking. She has never used smokeless tobacco. She reports that she does not drink alcohol and does not use drugs. Cynthia Bennett is allergic to ibuprofen.  ? ?HPI  ?Today, she is being contacted for a post-procedure assessment.  Once again the lumbar facet blocks have provided this patient with excellent relief of her pain.  She currently is enjoying an ongoing 90% relief.  While the local anesthetic was in place she had indicated 100% relief of her pain which again suggest that this area is directly involved with her low back pain. ? ?Post-procedure evaluation  ?  Type: Lumbar Facet, Medial Branch Block(s)  #R2L3   ?Laterality: Bilateral  ?Level: L2, L3, L4, L5, & S1 Medial Branch Level(s). Injecting these levels blocks the L3-4 and L5-S1 lumbar facet joints. ?Imaging: Fluoroscopic guidance ?Anesthesia: Local anesthesia (1-2% Lidocaine) ?Anxiolysis: None                 ?Sedation: None. ?DOS:  01/09/2022 ?Performed by: Cynthia Cola,  MD ? ?Primary Purpose: Therapeutic ?Indications: Low back pain severe enough to impact quality of life or function. ?1. Lumbar facet syndrome (Bilateral) (L>R)   ?2. Spondylosis without myelopathy or radiculopathy, lumbosacral region   ?3. Grade 1 spondylolisthesis of L4 over L5   ?4. Lumbar facet hypertrophy (Bilateral)   ?5. DDD (degenerative disc disease), lumbar   ?6. Chronic low back pain (1ry area of Pain) (Bilateral) (L>R) w/o sciatica   ?7. Abnormal MRI, lumbar spine (06/25/2016)   ? ?NAS-11 Pain score:  ? Pre-procedure: 10-Worst pain ever/10  ? Post-procedure: 0-No pain/10  ? ?  ?Effectiveness:  ?Initial hour after procedure: 100 %. ?Subsequent 4-6 hours post-procedure: 100 %. ?Analgesia past initial 6 hours: 90 %. ?Ongoing improvement:  ?Analgesic: The patient indicates currently enjoying an ongoing 90% relief of her pain.  She refers that most of the time she has 100% relief except for on some occasions where she will begin to experience some low back pain.  For the most part she refers doing extremely well. ?Function: Cynthia Bennett reports improvement in function ?ROM: Cynthia Bennett reports improvement in ROM ? ?Pharmacotherapy Assessment  ? ?Opioid Analgesic: Oxycodone IR 5 mg, 1 tab PO 3 times daily (maximum of 15 mg/day of oxycodone.) ?MME/day:  22.5 mg/day.  ? ?Monitoring: ?[place] PMP: PDMP reviewed during this encounter.       ?Pharmacotherapy: No side-effects or adverse reactions reported. ?Compliance: No problems identified. ?Effectiveness: Clinically acceptable. ?Plan: Refer to "POC". UDS:  ?Summary  ?Date Value Ref Range Status  ?05/22/2021 Note  Final  ?  Comment:  ?  ==================================================================== ?[employer] Select 13 (MW) ?==================================================================== ?Test                             Result       Flag       Units ? ?Drug Present and Declared for Prescription Verification ?  Oxycodone                      120          EXPECTED    ng/mg creat ?  Noroxycodone                   1698         EXPECTED   ng/mg creat ?   Sources of oxycodone include scheduled prescription medications. ?   Noroxycodone is an expected metabolite of oxycodone. ? ?==================================================================== ?Test                      Result    Flag   Units      Ref Range ?  Creatinine              56               mg/dL      >=20 ?==================================================================== ?Declared Medications: ? The flagging and interpretation on this report are based on the ? following declared medications.  Unexpected results Cynthia arise from ? inaccuracies in the declared medications. ? ? **Note: The testing scope of this panel includes these medications: ? ? Oxycodone ? ? **Note: The testing scope of this panel does not include the ? following reported medications: ? ? Acetaminophen (Tylenol) ? Bupropion (Wellbutrin SR) ? Cholecalciferol ? Doxepin ? Esomeprazole (Nexium) ? Multivitamin ? Ondansetron ? Pantoprazole (Protonix) ? Sertraline (Zoloft) ? Vitamin  B12 ? Zolpidem (Ambien) ?==================================================================== ?For clinical consultation, please call [phone]. ?==================================================================== ?  ?  ? ?Laboratory Chemistry Profile  ? ?Renal ?Lab Results  ?Component Value Date  ? BUN 24 (H) 01/15/2022  ? CREATININE 1.06 (H) 01/15/2022  ? BCR 11 (L) 01/10/2019  ? GFRAA 46 (L) 01/10/2019  ? GFRNONAA 51 (L) 01/15/2022  ?  Hepatic ?Lab Results  ?Component Value Date  ? AST 18 01/10/2019  ? ALT 11 (L) 06/24/2016  ? ALBUMIN 4.3 01/10/2019  ? ALKPHOS 74 01/10/2019  ? LIPASE 139 04/27/2014  ?  ?Electrolytes ?Lab Results  ?Component Value Date  ? NA 135 01/15/2022  ? K 3.8 01/15/2022  ? CL 104 01/15/2022  ? CALCIUM 9.1 01/15/2022  ? MG 2.3 01/10/2019  ?  Bone ?Lab Results  ?Component Value Date  ? 25OHVITD1 26 (L) 01/10/2019  ? 25OHVITD2 1.6 01/10/2019  ?  25OHVITD3 24 01/10/2019  ?  ?Inflammation (CRP: Acute Phase) (ESR: Chronic Phase) ?Lab Results  ?Component Value Date  ? CRP 0.9 08/24/2020  ? ESRSEDRATE 51 (H) 01/10/2019  ?    ?  ? ?Note: Above Lab results reviewed. ? ?I

## 2022-01-21 NOTE — Telephone Encounter (Signed)
Patient has Streptococcus the urine; sensitivities not done.  Patient symptomatic-we will call in amoxicillin 500 mg 3 times a day for 7 days. ? ?Heather RN to inform the patient.  ?

## 2022-01-21 NOTE — Progress Notes (Signed)
Patient started on amoxicillin 500 mg TID- based on urine cultures.  ? ?GB

## 2022-03-16 NOTE — Progress Notes (Signed)
Patient: Cynthia Bennett  Service Category: E/M  Provider: Gaspar Cola, MD  ?DOB: 09-Aug-1933  DOS: 03/19/2022  Location: Office  ?MRN: [id number]  Setting: Ambulatory outpatient  Referring Provider: [name], MD  ?Type: Established Patient  Specialty: Interventional Pain Management  PCP: [name], MD  ?Location: Remote location  Delivery: TeleHealth    ? ?Virtual Encounter - Pain Management ?PROVIDER NOTE: Information contained herein reflects review and annotations entered in association with encounter. Interpretation of such information and data should be left to medically-trained personnel. Information provided to patient can be located elsewhere in the medical record under "Patient Instructions". Document created using STT-dictation technology, any transcriptional errors that may result from process are unintentional.  ?  ?Contact & Pharmacy ?Preferred: [phone] ?Home: [phone] (home) ?Mobile: [phone] (mobile) ?E-mail: [name]  ?[address] ?[address] ?[place] [address] ?Phone: [phone] Fax: [phone] ?  ?Pre-screening  ?[name] offered "in-person" vs "virtual" encounter. She indicated preferring virtual for this encounter.  ? ?Reason ?COVID-19*  Social distancing based on CDC and AMA recommendations.  ? ?I contacted Juanice Warburton Korb on 03/19/2022 via telephone.      I clearly identified myself as Gaspar Cola, MD. I verified that I was speaking with the correct person using two identifiers (Name: Cynthia Bennett, and date of birth: April 13, 1933). ? ?Consent ?I sought verbal advanced consent from [name] for virtual visit interactions. I informed Ms. Verdejo of possible security and privacy concerns, risks, and limitations associated with providing "not-in-person" medical evaluation and management services. I also informed Ms. Dockery of the availability of "in-person" appointments. Finally, I informed her that there would be a  charge for the virtual visit and that she could be  personally, fully or partially, financially responsible for it. Ms. Sprague expressed understanding and agreed to proceed.  ? ?Historic Elements   ?Ms. TAMBRA MULLER is a 88 y.o. year old, female patient evaluated today after our last contact on 01/21/2022. Ms. Aliberti  has a past medical history of Dizzy spells, Fibromyalgia, Glaucoma, Hiatal hernia, Osteoarthritis, Osteoarthritis of spine with radiculopathy, lumbar region (08/14/2015), Peptic ulcer disease, Reflux, Spinal stenosis, Stroke ([place]), and TIA (transient ischemic attack). She also  has a past surgical history that includes Esophagogastroduodenoscopy (N/A, 04/16/2015); Appendectomy; and Abdominal hysterectomy. [name] has a current medication list which includes the following prescription(s): acetaminophen, amoxicillin, bupropion, vitamin d3, dorzolamide-timolol, doxepin, esomeprazole, ondansetron, [START ON 04/01/2022] oxycodone, sertraline, and zolpidem. She  reports that she has quit smoking. She has never used smokeless tobacco. She reports that she does not drink alcohol and does not use drugs. Ms. Leuthold is allergic to ibuprofen.  ? ?HPI  ?Today, she is being contacted for medication management.  The patient indicates doing well with the current medication regimen. No adverse reactions or side effects reported to the medications.  ? ?RTCB: 05/01/2022 ? ?Pharmacotherapy Assessment  ? ?Opioid Analgesic: Oxycodone IR 5 mg, 1 tab PO 3 times daily (maximum of 15 mg/day of oxycodone.) ?MME/day:  22.5 mg/day.  ? ?Monitoring: ?[place] PMP: PDMP reviewed during this encounter.       ?Pharmacotherapy: No side-effects or adverse reactions reported. ?Compliance: No problems identified. ?Effectiveness: Clinically acceptable. ?Plan: Refer to "POC". UDS:  ?Summary  ?Date Value Ref Range Status  ?05/22/2021 Note  Final  ?  Comment:  ?  ==================================================================== ?[facility] Select 13  (MW) ?==================================================================== ?Test  Result       Flag       Units ? ?Drug Present and Declared for Prescription Verification ?  Oxycodone                      120          EXPECTED   ng/mg creat ?  Noroxycodone                   1698         EXPECTED   ng/mg creat ?   Sources of oxycodone include scheduled prescription medications. ?   Noroxycodone is an expected metabolite of oxycodone. ? ?==================================================================== ?Test                      Result    Flag   Units      Ref Range ?  Creatinine              56               mg/dL      >=20 ?==================================================================== ?Declared Medications: ? The flagging and interpretation on this report are based on the ? following declared medications.  Unexpected results may arise from ? inaccuracies in the declared medications. ? ? **Note: The testing scope of this panel includes these medications: ? ? Oxycodone ? ? **Note: The testing scope of this panel does not include the ? following reported medications: ? ? Acetaminophen (Tylenol) ? Bupropion (Wellbutrin SR) ? Cholecalciferol ? Doxepin ? Esomeprazole (Nexium) ? Multivitamin ? Ondansetron ? Pantoprazole (Protonix) ? Sertraline (Zoloft) ? Vitamin B12 ? Zolpidem (Ambien) ?==================================================================== ?For clinical consultation, please call [phone]. ?==================================================================== ?  ?  ? ?Laboratory Chemistry Profile  ? ?Renal ?Lab Results  ?Component Value Date  ? BUN 24 (H) 01/15/2022  ? CREATININE 1.06 (H) 01/15/2022  ? BCR 11 (L) 01/10/2019  ? GFRAA 46 (L) 01/10/2019  ? GFRNONAA 51 (L) 01/15/2022  ?  Hepatic ?Lab Results  ?Component Value Date  ? AST 18 01/10/2019  ? ALT 11 (L) 06/24/2016  ? ALBUMIN 4.3 01/10/2019  ? ALKPHOS 74 01/10/2019  ? LIPASE 139 04/27/2014  ?   ?Electrolytes ?Lab Results  ?Component Value Date  ? NA 135 01/15/2022  ? K 3.8 01/15/2022  ? CL 104 01/15/2022  ? CALCIUM 9.1 01/15/2022  ? MG 2.3 01/10/2019  ?  Bone ?Lab Results  ?Component Value Date  ? 25OHVITD1 26 (L) 01/10/2019  ? 25OHVITD2 1.6 01/10/2019  ? 25OHVITD3 24 01/10/2019  ?  ?Inflammation (CRP: Acute Phase) (ESR: Chronic Phase) ?Lab Results  ?Component Value Date  ? CRP 0.9 08/24/2020  ? ESRSEDRATE 51 (H) 01/10/2019  ?    ?  ? ?Note: Above Lab results reviewed. ? ?Imaging  ?[facility] C-ARM 1-60 MIN NO REPORT ?Fluoro was used, but no Radiologist interpretation will be provided.  ?Please refer to "NOTES" tab for provider progress note. ? ?Assessment  ?The primary encounter diagnosis was Chronic pain syndrome. Diagnoses of Chronic low back pain (1ry area of Pain) (Bilateral) (L>R) w/o sciatica, Chronic lower extremity pain (2ry area of Pain) (Left), Chronic hip pain (3ry area of Pain) (Bilateral) (R>L), Pharmacologic therapy, Chronic use of opiate for therapeutic purpose, Encounter for medication management, and Encounter for chronic pain management were also pertinent to this visit. ? ?Plan of Care  ?Problem-specific:  ?No problem-specific Assessment & Plan notes found for this encounter. ? ?Ms.  Anne Fu Huish has a current medication list which includes the following long-term medication(s): bupropion, doxepin, [START ON 04/01/2022] oxycodone, and zolpidem. ? ?Pharmacotherapy (Medications Ordered): ?Meds ordered this encounter  ?Medications  ? oxyCODONE (OXY IR/ROXICODONE) 5 MG immediate release tablet  ?  Sig: Take 1 tablet (5 mg total) by mouth daily as needed for severe pain. Must last 30 days  ?  Dispense:  30 tablet  ?  Refill:  0  ?  DO NOT: delete (not duplicate); no partial-fill (will deny script to complete), no refill request (F/U required). DISPENSE: 1 day early if closed on fill date. WARN: No CNS-depressants within 8 hrs of med.  ? ?Orders:  ?No orders of the defined types were placed  in this encounter. ? ?Follow-up plan:   ?Return in about 6 weeks (around 05/01/2022) for Eval-day (M,W), (F2F), (MM).   ?  ?Interventional Therapies  ?Risk  Complexity Considerations:   ?Estimated body mass index is 24.8 kg/m? as c

## 2022-03-19 ENCOUNTER — Telehealth: Payer: Self-pay

## 2022-03-19 ENCOUNTER — Ambulatory Visit: Payer: PPO | Attending: Pain Medicine | Admitting: Pain Medicine

## 2022-03-19 ENCOUNTER — Encounter: Payer: Self-pay | Admitting: Pain Medicine

## 2022-03-19 DIAGNOSIS — G8929 Other chronic pain: Secondary | ICD-10-CM | POA: Diagnosis not present

## 2022-03-19 DIAGNOSIS — M545 Low back pain, unspecified: Secondary | ICD-10-CM | POA: Diagnosis not present

## 2022-03-19 DIAGNOSIS — M25552 Pain in left hip: Secondary | ICD-10-CM

## 2022-03-19 DIAGNOSIS — Z79891 Long term (current) use of opiate analgesic: Secondary | ICD-10-CM | POA: Diagnosis not present

## 2022-03-19 DIAGNOSIS — G894 Chronic pain syndrome: Secondary | ICD-10-CM | POA: Diagnosis not present

## 2022-03-19 DIAGNOSIS — M79605 Pain in left leg: Secondary | ICD-10-CM

## 2022-03-19 DIAGNOSIS — M25551 Pain in right hip: Secondary | ICD-10-CM | POA: Diagnosis not present

## 2022-03-19 DIAGNOSIS — Z79899 Other long term (current) drug therapy: Secondary | ICD-10-CM | POA: Diagnosis not present

## 2022-03-19 MED ORDER — OXYCODONE HCL 5 MG PO TABS: 5.0000 mg | ORAL_TABLET | Freq: Every day | ORAL | 0 refills | Status: DC | PRN

## 2022-03-19 NOTE — Telephone Encounter (Signed)
Tried calling patient to schedule her June appt. No answer, no vm  ?

## 2022-03-19 NOTE — Patient Instructions (Signed)
____________________________________________________________________________________________ ? ?Pharmacy Shortages of Pain Medication  ? ?Introduction ?Shockingly as it may seem, ? ? ??No U.S[place] Supreme Court decision has ever interpreted the Constitution as guaranteeing a right to health care for all Americans.? ?- https://www.healthequityandpolicylab.com/elusive-right-to-health-care-under-us-law ? ??With respect to human rights, the [place] has no formally codified right to health, nor does it participate in a human rights treaty that specifies a right to health.? ?- Scott J. Schweikart, JD, MBE ? ?Situation ?By now, most of our patients have had the experience of being told by their pharmacist that they do not have enough medication to cover their prescription. If you have not had this experience, just know that you soon will. ? ?Problem ?There appears to be a shortage of these medications, either at the national level or locally. This is happening with all pharmacies. When there is not enough medication, patients are offered a partial fill and they are told that they will try to get the rest of the medicine for them at a later time. If they do not have enough for even a partial fill, the pharmacists are telling the patients to call us (the prescribing physicians) to request that we send another prescription to another pharmacy to get the medicine.  ? ?This reordering of a controlled substance creates documentation problems where additional paperwork needs to be created to explain why two prescriptions for the same period of time and the same medicine are being prescribed to the same patient. It also creates situations where the last appointment note does not accurately reflect when and what prescriptions were given to a patient. This leads to prescribing errors down the line, in subsequent follow-up visits.  ? ?[place] Board of Pharmacy ([place]BOP) ?Research revealed that Board of Pharmacy Rule .1806 (21  NCAC 46.1806) authorizes pharmacists to the transfer of prescriptions among pharmacies, and it sets forth procedural and recordkeeping requirements for doing so. However, this requires the pharmacist to complete the previously mentioned procedural paperwork to accomplish the transfer. As it turns out, it is much easier for them to have the prescribing physicians do the work.  ? ?Possible solutions ?1. Have the [place] Assembly add a provision to the "STOP ACT" (the law that mandates how controlled substances are prescribed) where there is an exception to the electronic prescribing rule that states that in the event there are shortages of medications the physicians are allowed to use written prescriptions as opposed to electronic ones. This would allow patients to take their prescriptions to a different pharmacy that may have enough medication available to fill the prescription. The problem is that currently there is a law that does not allow for written prescriptions, with the exception of instances where the electronic medical record is down due to technical issues.  ?2. Have [place] Congress ease the pressure on pharmaceutical companies, allowing them to produce enough quantities of the medication to adequately supply the population. ?3. Have pharmacies keep enough stocks of these medications to cover their client base.  ?4. Have the [place] State Assembly add a provision to the "STOP ACT" where they ease the regulations surrounding the transfer of controlled substances between pharmacies, so as to simplify the transfer of supplies. As an alternative, develop a system to allow patients to obtain the remainder of their prescription at another one of their pharmacies or at an associate pharmacy.  ? ?How this shortage will affect you.  ?The one thing that is abundantly clear is that this is a pharmacy supply   problem  and not a prescriber problem. The job of the prescriber is to evaluate and monitor the  patients for the appropriate indications to the use of these medicines, the monitoring of their use and the prescribing of the appropriate dose and regimen. It is not the job of the prescriber to provide or dispense the actual medication. By law, this is the job of the pharmacies and pharmacists. It is certainly not the job of the prescriber to solve the supply problems.  ? ?Due to the above problems we are no longer taking patients to write for their pain medication. We will continue to evaluate for appropriate indications and we may provide recommendations regarding medication, dose, and schedule, as well as monitoring recommendations, however, we will not be taking over the actual prescribing of these substances. On those patients where we are treating their chronic pain with interventional therapies, exceptions will be considered on a case by case basis. At this time, we will try to continue providing this supplemental service to those patients we have been managing in the past. However, as of August 1st, 2023, we no longer will be sending additional prescriptions to other pharmacies for the purpose of solving their supply problems. Once we send a prescription to a pharmacy, we will not be resending it again to another pharmacy to cover for their shortages.  ? ?What to do. ?Write as many letters as you can. Recruit the help of family members in writing these letters. Below are some of the places where you can write to make your voice heard. Let them know what the problem is and push them to look for solutions.  ? ?Search internet for: ?[place] find your legislators? ?https://www.ncleg.gov/findyourlegislators ? ?Search internet for: ?[place] insurance commissioner complaints? ?https://www.ncdoi.gov/contactscomplaints/assistance-or-file-complaint ? ?Search internet for: ?[place] Board of Pharmacy complaints? ?http://www.ncbop.org/contact.htm ? ?Search internet for: ?[facility] pharmacy  complaints? ?Email [facility] Customer Relations ?https://www.[facility].com/help/email-customer-relations.jsp?callType=store ? ?Search internet for: ?[facility] pharmacy customer service complaints? ?https://www.[facility].com/topic/marketing/contactus/contactus_customerservice.jsp ? ?____________________________________________________________________________________________ ? ____________________________________________________________________________________________ ? ?Medication Rules ? ?Purpose: To inform patients, and their family members, of our rules and regulations. ? ?Applies to: All patients receiving prescriptions (written or electronic). ? ?Pharmacy of record: Pharmacy where electronic prescriptions will be sent. If written prescriptions are taken to a different pharmacy, please inform the nursing staff. The pharmacy listed in the electronic medical record should be the one where you would like electronic prescriptions to be sent. ? ?Electronic prescriptions: In compliance with the [place] Strengthen Opioid Misuse Prevention (STOP) Act of 2017 (Session Law 2017-74/H243), effective November 10, 2018, all controlled substances must be electronically prescribed. Calling prescriptions to the pharmacy will cease to exist. ? ?Prescription refills: Only during scheduled appointments. Applies to all prescriptions. ? ?NOTE: The following applies primarily to controlled substances (Opioid* Pain Medications).  ? ?Type of encounter (visit): For patients receiving controlled substances, face-to-face visits are required. (Not an option or up to the patient.) ? ?Patient's responsibilities: ?Pain Pills: Bring all pain pills to every appointment (except for procedure appointments). ?Pill Bottles: Bring pills in original pharmacy bottle. Always bring the newest bottle. Bring bottle, even if empty. ?Medication refills: You are responsible for knowing and keeping track of what medications you take and those you need  refilled. ?The day before your appointment: write a list of all prescriptions that need to be refilled. ?The day of the appointment: give the list to the admitting nurse. Prescriptions will be written only during appoint

## 2022-03-24 DIAGNOSIS — G4700 Insomnia, unspecified: Secondary | ICD-10-CM | POA: Diagnosis not present

## 2022-03-24 DIAGNOSIS — Z9181 History of falling: Secondary | ICD-10-CM | POA: Diagnosis not present

## 2022-03-24 DIAGNOSIS — Z0000 Encounter for general adult medical examination without abnormal findings: Secondary | ICD-10-CM | POA: Diagnosis not present

## 2022-03-24 DIAGNOSIS — R2689 Other abnormalities of gait and mobility: Secondary | ICD-10-CM | POA: Diagnosis not present

## 2022-03-24 DIAGNOSIS — M797 Fibromyalgia: Secondary | ICD-10-CM | POA: Diagnosis not present

## 2022-03-24 DIAGNOSIS — R269 Unspecified abnormalities of gait and mobility: Secondary | ICD-10-CM | POA: Diagnosis not present

## 2022-03-27 DIAGNOSIS — K5909 Other constipation: Secondary | ICD-10-CM | POA: Diagnosis not present

## 2022-03-27 DIAGNOSIS — K5790 Diverticulosis of intestine, part unspecified, without perforation or abscess without bleeding: Secondary | ICD-10-CM | POA: Diagnosis not present

## 2022-03-27 DIAGNOSIS — K469 Unspecified abdominal hernia without obstruction or gangrene: Secondary | ICD-10-CM | POA: Diagnosis not present

## 2022-03-27 DIAGNOSIS — E669 Obesity, unspecified: Secondary | ICD-10-CM | POA: Diagnosis not present

## 2022-03-27 DIAGNOSIS — Z8673 Personal history of transient ischemic attack (TIA), and cerebral infarction without residual deficits: Secondary | ICD-10-CM | POA: Diagnosis not present

## 2022-03-27 DIAGNOSIS — Z8701 Personal history of pneumonia (recurrent): Secondary | ICD-10-CM | POA: Diagnosis not present

## 2022-03-27 DIAGNOSIS — J45909 Unspecified asthma, uncomplicated: Secondary | ICD-10-CM | POA: Diagnosis not present

## 2022-03-27 DIAGNOSIS — H401190 Primary open-angle glaucoma, unspecified eye, stage unspecified: Secondary | ICD-10-CM | POA: Diagnosis not present

## 2022-03-27 DIAGNOSIS — K649 Unspecified hemorrhoids: Secondary | ICD-10-CM | POA: Diagnosis not present

## 2022-03-27 DIAGNOSIS — D649 Anemia, unspecified: Secondary | ICD-10-CM | POA: Diagnosis not present

## 2022-03-27 DIAGNOSIS — Z9181 History of falling: Secondary | ICD-10-CM | POA: Diagnosis not present

## 2022-03-27 DIAGNOSIS — Z86010 Personal history of colonic polyps: Secondary | ICD-10-CM | POA: Diagnosis not present

## 2022-03-27 DIAGNOSIS — Z720 Tobacco use: Secondary | ICD-10-CM | POA: Diagnosis not present

## 2022-03-27 DIAGNOSIS — I2510 Atherosclerotic heart disease of native coronary artery without angina pectoris: Secondary | ICD-10-CM | POA: Diagnosis not present

## 2022-03-27 DIAGNOSIS — M1990 Unspecified osteoarthritis, unspecified site: Secondary | ICD-10-CM | POA: Diagnosis not present

## 2022-03-27 DIAGNOSIS — K2270 Barrett's esophagus without dysplasia: Secondary | ICD-10-CM | POA: Diagnosis not present

## 2022-03-27 DIAGNOSIS — G4700 Insomnia, unspecified: Secondary | ICD-10-CM | POA: Diagnosis not present

## 2022-03-27 DIAGNOSIS — K219 Gastro-esophageal reflux disease without esophagitis: Secondary | ICD-10-CM | POA: Diagnosis not present

## 2022-03-27 DIAGNOSIS — M797 Fibromyalgia: Secondary | ICD-10-CM | POA: Diagnosis not present

## 2022-03-27 DIAGNOSIS — E785 Hyperlipidemia, unspecified: Secondary | ICD-10-CM | POA: Diagnosis not present

## 2022-04-15 DIAGNOSIS — M1990 Unspecified osteoarthritis, unspecified site: Secondary | ICD-10-CM | POA: Diagnosis not present

## 2022-04-15 DIAGNOSIS — E669 Obesity, unspecified: Secondary | ICD-10-CM | POA: Diagnosis not present

## 2022-04-15 DIAGNOSIS — Z86010 Personal history of colonic polyps: Secondary | ICD-10-CM | POA: Diagnosis not present

## 2022-04-15 DIAGNOSIS — E785 Hyperlipidemia, unspecified: Secondary | ICD-10-CM | POA: Diagnosis not present

## 2022-04-15 DIAGNOSIS — K219 Gastro-esophageal reflux disease without esophagitis: Secondary | ICD-10-CM | POA: Diagnosis not present

## 2022-04-15 DIAGNOSIS — G4700 Insomnia, unspecified: Secondary | ICD-10-CM | POA: Diagnosis not present

## 2022-04-15 DIAGNOSIS — Z720 Tobacco use: Secondary | ICD-10-CM | POA: Diagnosis not present

## 2022-04-15 DIAGNOSIS — K5790 Diverticulosis of intestine, part unspecified, without perforation or abscess without bleeding: Secondary | ICD-10-CM | POA: Diagnosis not present

## 2022-04-15 DIAGNOSIS — K5909 Other constipation: Secondary | ICD-10-CM | POA: Diagnosis not present

## 2022-04-15 DIAGNOSIS — M797 Fibromyalgia: Secondary | ICD-10-CM | POA: Diagnosis not present

## 2022-04-15 DIAGNOSIS — K649 Unspecified hemorrhoids: Secondary | ICD-10-CM | POA: Diagnosis not present

## 2022-04-15 DIAGNOSIS — H401190 Primary open-angle glaucoma, unspecified eye, stage unspecified: Secondary | ICD-10-CM | POA: Diagnosis not present

## 2022-04-15 DIAGNOSIS — K2270 Barrett's esophagus without dysplasia: Secondary | ICD-10-CM | POA: Diagnosis not present

## 2022-04-15 DIAGNOSIS — Z8701 Personal history of pneumonia (recurrent): Secondary | ICD-10-CM | POA: Diagnosis not present

## 2022-04-15 DIAGNOSIS — J45909 Unspecified asthma, uncomplicated: Secondary | ICD-10-CM | POA: Diagnosis not present

## 2022-04-15 DIAGNOSIS — Z9181 History of falling: Secondary | ICD-10-CM | POA: Diagnosis not present

## 2022-04-15 DIAGNOSIS — I2510 Atherosclerotic heart disease of native coronary artery without angina pectoris: Secondary | ICD-10-CM | POA: Diagnosis not present

## 2022-04-15 DIAGNOSIS — D649 Anemia, unspecified: Secondary | ICD-10-CM | POA: Diagnosis not present

## 2022-04-15 DIAGNOSIS — K469 Unspecified abdominal hernia without obstruction or gangrene: Secondary | ICD-10-CM | POA: Diagnosis not present

## 2022-04-15 DIAGNOSIS — Z8673 Personal history of transient ischemic attack (TIA), and cerebral infarction without residual deficits: Secondary | ICD-10-CM | POA: Diagnosis not present

## 2022-04-17 DIAGNOSIS — H401133 Primary open-angle glaucoma, bilateral, severe stage: Secondary | ICD-10-CM | POA: Diagnosis not present

## 2022-04-21 ENCOUNTER — Telehealth: Payer: Self-pay | Admitting: Internal Medicine

## 2022-04-21 DIAGNOSIS — E611 Iron deficiency: Secondary | ICD-10-CM

## 2022-04-21 NOTE — Telephone Encounter (Addendum)
Appts 01/15/22 lab/MD/Venofer with check out: follow up in 4 months- MD; labs- cbc/bmp/iron studies/ferritin-- possible IVvenofer   Recieived Venofer on 01/15/22 and 01/21/22  Scheduled for lab/MD/Venofer on 05/20/22

## 2022-04-21 NOTE — Telephone Encounter (Signed)
Pt daugher, Zella Ball, called. She stated her mother was feeling weak as if her iron was low. She wanted to know if she can be seen sooner than 7/11.

## 2022-04-22 DIAGNOSIS — H401190 Primary open-angle glaucoma, unspecified eye, stage unspecified: Secondary | ICD-10-CM | POA: Diagnosis not present

## 2022-04-22 DIAGNOSIS — K219 Gastro-esophageal reflux disease without esophagitis: Secondary | ICD-10-CM | POA: Diagnosis not present

## 2022-04-22 DIAGNOSIS — D649 Anemia, unspecified: Secondary | ICD-10-CM | POA: Diagnosis not present

## 2022-04-22 DIAGNOSIS — I2510 Atherosclerotic heart disease of native coronary artery without angina pectoris: Secondary | ICD-10-CM | POA: Diagnosis not present

## 2022-04-22 DIAGNOSIS — J45909 Unspecified asthma, uncomplicated: Secondary | ICD-10-CM | POA: Diagnosis not present

## 2022-04-22 DIAGNOSIS — M1990 Unspecified osteoarthritis, unspecified site: Secondary | ICD-10-CM | POA: Diagnosis not present

## 2022-04-22 DIAGNOSIS — M797 Fibromyalgia: Secondary | ICD-10-CM | POA: Diagnosis not present

## 2022-04-23 ENCOUNTER — Other Ambulatory Visit: Payer: Self-pay

## 2022-04-23 ENCOUNTER — Inpatient Hospital Stay: Payer: PPO | Admitting: Medical Oncology

## 2022-04-23 ENCOUNTER — Inpatient Hospital Stay: Payer: PPO

## 2022-04-23 ENCOUNTER — Encounter: Payer: Self-pay | Admitting: Medical Oncology

## 2022-04-23 ENCOUNTER — Inpatient Hospital Stay: Payer: PPO | Attending: Internal Medicine

## 2022-04-23 VITALS — BP 108/60 | HR 75 | Temp 97.0°F | Resp 18 | Ht 62.0 in | Wt 137.0 lb

## 2022-04-23 DIAGNOSIS — M549 Dorsalgia, unspecified: Secondary | ICD-10-CM | POA: Insufficient documentation

## 2022-04-23 DIAGNOSIS — E611 Iron deficiency: Secondary | ICD-10-CM

## 2022-04-23 DIAGNOSIS — M1990 Unspecified osteoarthritis, unspecified site: Secondary | ICD-10-CM | POA: Diagnosis not present

## 2022-04-23 DIAGNOSIS — D631 Anemia in chronic kidney disease: Secondary | ICD-10-CM | POA: Diagnosis not present

## 2022-04-23 DIAGNOSIS — Z886 Allergy status to analgesic agent status: Secondary | ICD-10-CM | POA: Insufficient documentation

## 2022-04-23 DIAGNOSIS — Z9049 Acquired absence of other specified parts of digestive tract: Secondary | ICD-10-CM | POA: Insufficient documentation

## 2022-04-23 DIAGNOSIS — Z87891 Personal history of nicotine dependence: Secondary | ICD-10-CM | POA: Insufficient documentation

## 2022-04-23 DIAGNOSIS — Z818 Family history of other mental and behavioral disorders: Secondary | ICD-10-CM | POA: Insufficient documentation

## 2022-04-23 DIAGNOSIS — D509 Iron deficiency anemia, unspecified: Secondary | ICD-10-CM

## 2022-04-23 DIAGNOSIS — K449 Diaphragmatic hernia without obstruction or gangrene: Secondary | ICD-10-CM | POA: Insufficient documentation

## 2022-04-23 DIAGNOSIS — R231 Pallor: Secondary | ICD-10-CM | POA: Insufficient documentation

## 2022-04-23 DIAGNOSIS — Z836 Family history of other diseases of the respiratory system: Secondary | ICD-10-CM | POA: Diagnosis not present

## 2022-04-23 DIAGNOSIS — R0602 Shortness of breath: Secondary | ICD-10-CM | POA: Diagnosis not present

## 2022-04-23 DIAGNOSIS — Z79899 Other long term (current) drug therapy: Secondary | ICD-10-CM | POA: Diagnosis not present

## 2022-04-23 DIAGNOSIS — Z8673 Personal history of transient ischemic attack (TIA), and cerebral infarction without residual deficits: Secondary | ICD-10-CM | POA: Diagnosis not present

## 2022-04-23 DIAGNOSIS — K5900 Constipation, unspecified: Secondary | ICD-10-CM | POA: Insufficient documentation

## 2022-04-23 DIAGNOSIS — R110 Nausea: Secondary | ICD-10-CM | POA: Diagnosis not present

## 2022-04-23 DIAGNOSIS — M2550 Pain in unspecified joint: Secondary | ICD-10-CM | POA: Insufficient documentation

## 2022-04-23 DIAGNOSIS — N183 Chronic kidney disease, stage 3 unspecified: Secondary | ICD-10-CM | POA: Diagnosis not present

## 2022-04-23 DIAGNOSIS — G8929 Other chronic pain: Secondary | ICD-10-CM | POA: Diagnosis not present

## 2022-04-23 DIAGNOSIS — R5383 Other fatigue: Secondary | ICD-10-CM | POA: Diagnosis not present

## 2022-04-23 LAB — CBC WITH DIFFERENTIAL/PLATELET
Abs Immature Granulocytes: 0.05 10*3/uL (ref 0.00–0.07)
Basophils Absolute: 0.1 10*3/uL (ref 0.0–0.1)
Basophils Relative: 1 %
Eosinophils Absolute: 0.3 10*3/uL (ref 0.0–0.5)
Eosinophils Relative: 3 %
HCT: 29 % — ABNORMAL LOW (ref 36.0–46.0)
Hemoglobin: 8.2 g/dL — ABNORMAL LOW (ref 12.0–15.0)
Immature Granulocytes: 1 %
Lymphocytes Relative: 25 %
Lymphs Abs: 2.2 10*3/uL (ref 0.7–4.0)
MCH: 22 pg — ABNORMAL LOW (ref 26.0–34.0)
MCHC: 28.3 g/dL — ABNORMAL LOW (ref 30.0–36.0)
MCV: 78 fL — ABNORMAL LOW (ref 80.0–100.0)
Monocytes Absolute: 0.5 10*3/uL (ref 0.1–1.0)
Monocytes Relative: 5 %
Neutro Abs: 5.8 10*3/uL (ref 1.7–7.7)
Neutrophils Relative %: 65 %
Platelets: 516 10*3/uL — ABNORMAL HIGH (ref 150–400)
RBC: 3.72 MIL/uL — ABNORMAL LOW (ref 3.87–5.11)
RDW: 16.7 % — ABNORMAL HIGH (ref 11.5–15.5)
WBC: 8.8 10*3/uL (ref 4.0–10.5)
nRBC: 0 % (ref 0.0–0.2)

## 2022-04-23 LAB — BASIC METABOLIC PANEL
Anion gap: 9 (ref 5–15)
BUN: 13 mg/dL (ref 8–23)
CO2: 23 mmol/L (ref 22–32)
Calcium: 8.9 mg/dL (ref 8.9–10.3)
Chloride: 106 mmol/L (ref 98–111)
Creatinine, Ser: 1.12 mg/dL — ABNORMAL HIGH (ref 0.44–1.00)
GFR, Estimated: 48 mL/min — ABNORMAL LOW (ref >60)
Glucose, Bld: 148 mg/dL — ABNORMAL HIGH (ref 70–99)
Potassium: 3.8 mmol/L (ref 3.5–5.1)
Sodium: 138 mmol/L (ref 135–145)

## 2022-04-23 LAB — IRON AND TIBC
Iron: 13 ug/dL — ABNORMAL LOW (ref 28–170)
Saturation Ratios: 3 % — ABNORMAL LOW (ref 10.4–31.8)
TIBC: 385 ug/dL (ref 250–450)
UIBC: 372 ug/dL

## 2022-04-23 LAB — FERRITIN: Ferritin: 6 ng/mL — ABNORMAL LOW (ref 11–307)

## 2022-04-23 MED ORDER — SODIUM CHLORIDE 0.9 % IV SOLN: 200.0000 mg | Freq: Once | INTRAVENOUS | Status: DC

## 2022-04-23 MED ORDER — SODIUM CHLORIDE 0.9 % IV SOLN
Freq: Once | INTRAVENOUS | Status: AC
  Filled 2022-04-23: qty 250

## 2022-04-23 MED ORDER — IRON SUCROSE 20 MG/ML IV SOLN
200.0000 mg | Freq: Once | INTRAVENOUS | Status: AC
  Administered 2022-04-23: 200 mg via INTRAVENOUS
  Filled 2022-04-23: qty 10

## 2022-04-23 NOTE — Progress Notes (Signed)
Cynthia Bennett NOTE  Patient Care Team: Cynthia Pink, MD as PCP - General (Family Medicine) Cynthia Sickle, MD as Consulting Physician (Hematology and Oncology)  CHIEF COMPLAINTS/PURPOSE OF CONSULTATION: Anemia   HEMATOLOGY HISTORY  # CHRONIC NORMOCYTIC ANEMIA [since 2019-PCP- Hb 8-9; Iron sat- 5%;NO Ferritin; GFR- 43]EGD/colonoscopy-?2013-2104- pt declines procedures; Intolerance to p.o. iron. IV Venofer-  #CKD stage III/chronic pain back-pain clinic narcotics.  HISTORY OF PRESENTING ILLNESS: In a wheelchair.  Accompanied by daughter.  Cynthia Bennett 88 y.o.  female history of anemia secondary ?chronic kidney disease is here for follow-up.  Today patient states that over the past few weeks she has noticed a decline in her energy, some mild SOB with exercise and pallor of her skin. Lack of energy. No known bleeding episodes and denies hematuria, hemoptysis, melena. Some mild intermittent nausea and constipation. Not followed by GI. Not taking NSAIDS or steroids. Has hiatal hernia for which she takes nexium.    Review of Systems  Constitutional:  Positive for malaise/fatigue. Negative for chills, diaphoresis and fever.  HENT:  Negative for nosebleeds and sore throat.   Eyes:  Negative for double vision.  Respiratory:  Negative for cough, hemoptysis, sputum production, shortness of breath and wheezing.   Cardiovascular:  Negative for chest pain, palpitations, orthopnea and leg swelling.  Gastrointestinal:  Negative for abdominal pain, blood in stool, constipation, diarrhea, heartburn, melena, nausea and vomiting.  Genitourinary:  Negative for dysuria, frequency and urgency.  Musculoskeletal:  Positive for back pain and joint pain.  Skin: Negative.  Negative for itching and rash.  Neurological:  Negative for dizziness, tingling, focal weakness, weakness and headaches.  Endo/Heme/Allergies:  Does not bruise/bleed easily.  Psychiatric/Behavioral:  Negative for  depression. The patient is not nervous/anxious and does not have insomnia.     MEDICAL HISTORY:  Past Medical History:  Diagnosis Date   Dizzy spells    Fibromyalgia    Glaucoma    Hiatal hernia    Osteoarthritis    Osteoarthritis of spine with radiculopathy, lumbar region 08/14/2015   Peptic ulcer disease    Reflux    Spinal stenosis    Stroke ([place])    TIA (transient ischemic attack)     SURGICAL HISTORY: Past Surgical History:  Procedure Laterality Date   ABDOMINAL HYSTERECTOMY     APPENDECTOMY     ESOPHAGOGASTRODUODENOSCOPY N/A 04/16/2015   Procedure: ESOPHAGOGASTRODUODENOSCOPY (EGD);  Surgeon: Cynthia Luster, MD;  Location: [facility] ENDOSCOPY;  Service: Gastroenterology;  Laterality: N/A;    SOCIAL HISTORY: Social History   Socioeconomic History   Marital status: Widowed    Spouse name: Not on file   Number of children: Not on file   Years of education: Not on file   Highest education level: Not on file  Occupational History   Not on file  Tobacco Use   Smoking status: Former   Smokeless tobacco: Never  Vaping Use   Vaping Use: Never used  Substance and Sexual Activity   Alcohol use: No    Alcohol/week: 0.0 standard drinks of alcohol   Drug use: No   Sexual activity: Not on file  Other Topics Concern   Not on file  Social History Narrative   Pt lives in [place]; with son. Quit smoking in 1957. No alcohol. Designed clothes/ alterations.    Social Determinants of Health   Financial Resource Strain: Not on file  Food Insecurity: Not on file  Transportation Needs: Not on file  Physical Activity: Not on  file  Stress: Not on file  Social Connections: Not on file  Intimate Partner Violence: Not on file    FAMILY HISTORY: Family History  Problem Relation Age of Onset   Mental illness Mother    COPD Father     ALLERGIES:  is allergic to ibuprofen.  MEDICATIONS:  Current Outpatient Medications  Medication Sig Dispense Refill   acetaminophen (TYLENOL) 325 MG  tablet Take 650 mg by mouth every 6 (six) hours as needed.     buPROPion (WELLBUTRIN SR) 150 MG 12 hr tablet TAKE 1 TABLET BY MOUTH ONCE DAILY     Cholecalciferol (VITAMIN D3) 20 MCG (800 UNIT) TABS Take by mouth 2 (two) times daily.     dorzolamide-timolol (COSOPT) 22.3-6.8 MG/ML ophthalmic solution Place 1 drop into both eyes in the morning and at bedtime.     doxepin (SINEQUAN) 100 MG capsule Take 100 mg by mouth at bedtime.     esomeprazole (NEXIUM) 40 MG capsule Take 40 mg by mouth 2 (two) times daily.     magnesium oxide (MAG-OX) 400 MG tablet Take by mouth.     ondansetron (ZOFRAN) 8 MG tablet TAKE 1 TABLET BY MOUTH EVERY 8 HOURS AS NEEDED FOR NAUSEA AND VOMITING 45 tablet 1   oxyCODONE (OXY IR/ROXICODONE) 5 MG immediate release tablet Take 1 tablet (5 mg total) by mouth daily as needed for severe pain. Must last 30 days 30 tablet 0   sertraline (ZOLOFT) 100 MG tablet Take 100 mg by mouth daily.     zolpidem (AMBIEN) 10 MG tablet TAKE 1/2 TO 1 TABLET BY MOUTH EVERY NIGHT AT BEDTIME     amoxicillin (AMOXIL) 500 MG capsule Take 1 capsule (500 mg total) by mouth 3 (three) times daily. 21 capsule 0   No current facility-administered medications for this visit.      PHYSICAL EXAMINATION:   Vitals:   04/23/22 1339  BP: 108/60  Pulse: 75  Resp: 18  Temp: (!) 97 F (36.1 C)  SpO2: 100%   Filed Weights   04/23/22 1339  Weight: 137 lb (62.1 kg)    Physical Exam HENT:     Head: Normocephalic and atraumatic.     Mouth/Throat:     Pharynx: No oropharyngeal exudate.  Eyes:     Pupils: Pupils are equal, round, and reactive to light.     Comments: pallor  Cardiovascular:     Rate and Rhythm: Normal rate and regular rhythm.  Pulmonary:     Effort: Pulmonary effort is normal. No respiratory distress.     Breath sounds: Normal breath sounds. No wheezing.  Abdominal:     General: Bowel sounds are normal. There is no distension.     Palpations: Abdomen is soft. There is no mass.      Tenderness: There is no abdominal tenderness. There is no guarding or rebound.  Musculoskeletal:        General: No tenderness. Normal range of motion.     Cervical back: Normal range of motion and neck supple.  Skin:    General: Skin is warm.     Coloration: Skin is pale.  Neurological:     Mental Status: She is alert and oriented to person, place, and time.  Psychiatric:        Mood and Affect: Affect normal.     LABORATORY DATA:  I have reviewed the data as listed Lab Results  Component Value Date   WBC 8.8 04/23/2022   HGB 8.2 (L) 04/23/2022  HCT 29.0 (L) 04/23/2022   MCV 78.0 (L) 04/23/2022   PLT 516 (H) 04/23/2022   Recent Labs    09/18/21 1240 01/15/22 1250 04/23/22 1310  NA 135 135 138  K 4.0 3.8 3.8  CL 103 104 106  CO2 23 24 23   GLUCOSE 87 133* 148*  BUN 11 24* 13  CREATININE 0.94 1.06* 1.12*  CALCIUM 9.1 9.1 8.9  GFRNONAA 59* 51* 48*    No results found.  No problem-specific Assessment & Plan notes found for this encounter.  Encounter Diagnoses  Name Primary?   Iron deficiency Yes   Microcytic anemia    Patient has chronic iron deficiency which is thought to be due to CKD3. How having what looks like reactive changes on CBC to possible GI bleed given her nausea. Will refer to GI. She will continue her PPI and we discussed avoidance of NSAIDS. Discussed red flag signs and symptoms. She will stay hydrated with water and follow up in 2 weeks for CBC to see if transfusion is needed. Starting Venofer weekly x 5 today which she has tolerated well in the past.     All questions were answered. The patient knows to call the clinic with any problems, questions or concerns.      Hughie Closs, PA-C 04/23/2022 2:39 PM

## 2022-04-23 NOTE — Progress Notes (Signed)
Patient here for oncology follow-up appointment, concerns of fatigue and nausea

## 2022-04-27 NOTE — Progress Notes (Signed)
PROVIDER NOTE: Information contained herein reflects review and annotations entered in association with encounter. Interpretation of such information and data should be left to medically-trained personnel. Information provided to patient can be located elsewhere in the medical record under "Patient Instructions". Document created using STT-dictation technology, any transcriptional errors that may result from process are unintentional.    Patient: Cynthia Bennett  Service Category: E/M  Provider: Gaspar Cola, MD  DOB: [birth year]  DOS: 04/28/2022  Specialty: Interventional Pain Management  MRN: [id number]  Setting: Ambulatory outpatient  PCP: Cynthia Pink, MD  Type: Established Patient    Referring Provider: [name], MD  Location: Office  Delivery: Face-to-face     HPI  Cynthia Bennett, a 88 y.o. year old female, is here today because of her Chronic pain syndrome [G89.4]. [name] primary complain today is No chief complaint on file. Last encounter: My last encounter with her was on 03/19/2022. Pertinent problems: [name] has Fibromyalgia; Chronic pain syndrome; Lumbar radicular pain (Bilateral) (R>L); Chronic low back pain (1ry area of Pain) (Bilateral) (L>R) w/o sciatica; Grade 1 spondylolisthesis of L4 over L5; Lumbar discogenic pain syndrome; Lumbar facet syndrome (Bilateral) (L>R); Lumbar facet hypertrophy (Bilateral); Chronic sacroiliac joint pain (Left); Chronic hip pain (Left); Lumbar spondylosis; Abnormal MRI, lumbar spine (06/25/2016); Cervical spondylosis (Anterolisthesis of C5 over C6); Chronic lower extremity pain (2ry area of Pain) (Left); Chronic lumbar radicular pain (Left) (L5 Dermatome); Osteoarthritis of hip (Left); Chronic sacroiliac joint pain (Bilateral) (R>L); Chronic shoulder pain (Left); Osteoarthritis of shoulder (Left); Chronic sacroiliac joint pain (Right); Chronic hip pain (Right); Left upper arm pain; Myofascial pain syndrome (left upper extremity);  Acromioclavicular joint pain (Left); Osteoarthritis of AC (acromioclavicular) joint (Left); Supraspinatus tendon tear, sequela (Left); Spondylosis without myelopathy or radiculopathy, lumbosacral region; Other specified dorsopathies, sacral and sacrococcygeal region; Bertolotti's syndrome (Left); DDD (degenerative disc disease), lumbar; Severe (5-6 mm) L4-5 lumbar spinal stenosis and (8 mm) L3-4 spinal stenosis.; Osteoarthritis of hip (Right); Trigger point with back pain (Right); Chronic myofascial pain; Chronic low back pain (Right) w/o sciatica; Chronic hip pain (3ry area of Pain) (Bilateral) (R>L); Osteoarthritis of hips (Bilateral); Chronic low back pain (Left) w/o sciatica; Thoracic back pain (Left); DDD (degenerative disc disease), thoracic; DDD (degenerative disc disease), thoracolumbar; Scoliosis of thoracolumbar region due to degenerative disease of spine in adult; Weakness of left leg; and Numbness and tingling of left leg on their pertinent problem list. Pain Assessment: Severity of   is reported as a  /10. Location:    / . Onset:  . Quality:  . Timing:  . Modifying factor(s):  Marland Kitchen Vitals:  vitals were not taken for this visit.   Reason for encounter: medication management. ***  Pharmacotherapy Assessment  Analgesic: Oxycodone IR 5 mg, 1 tab PO 3 times daily (maximum of 15 mg/day of oxycodone.) MME/day:  22.5 mg/day.   Monitoring: [place] PMP: PDMP reviewed during this encounter.       Pharmacotherapy: No side-effects or adverse reactions reported. Compliance: No problems identified. Effectiveness: Clinically acceptable.  No notes on file  UDS:  Summary  Date Value Ref Range Status  05/22/2021 Note  Final    Comment:    ==================================================================== [employer] Select 13 (MW) ==================================================================== Test                             Result       Flag       Units  Drug Present and  Declared for Prescription  Verification   Oxycodone                      120          EXPECTED   ng/mg creat   Noroxycodone                   1698         EXPECTED   ng/mg creat    Sources of oxycodone include scheduled prescription medications.    Noroxycodone is an expected metabolite of oxycodone.  ==================================================================== Test                      Result    Flag   Units      Ref Range   Creatinine              56               mg/dL      >=20 ==================================================================== Declared Medications:  The flagging and interpretation on this report are based on the  following declared medications.  Unexpected results may arise from  inaccuracies in the declared medications.   **Note: The testing scope of this panel includes these medications:   Oxycodone   **Note: The testing scope of this panel does not include the  following reported medications:   Acetaminophen (Tylenol)  Bupropion (Wellbutrin SR)  Cholecalciferol  Doxepin  Esomeprazole (Nexium)  Multivitamin  Ondansetron  Pantoprazole (Protonix)  Sertraline (Zoloft)  Vitamin B12  Zolpidem (Ambien) ==================================================================== For clinical consultation, please call [phone]. ====================================================================      ROS  Constitutional: Denies any fever or chills Gastrointestinal: No reported hemesis, hematochezia, vomiting, or acute GI distress Musculoskeletal: Denies any acute onset joint swelling, redness, loss of ROM, or weakness Neurological: No reported episodes of acute onset apraxia, aphasia, dysarthria, agnosia, amnesia, paralysis, loss of coordination, or loss of consciousness  Medication Review  Vitamin D3, acetaminophen, amoxicillin, buPROPion, dorzolamide-timolol, doxepin, esomeprazole, magnesium oxide, ondansetron, oxyCODONE, sertraline, and zolpidem  History Review   Allergy: Cynthia Bennett is allergic to ibuprofen. Drug: Cynthia Bennett  reports no history of drug use. Alcohol:  reports no history of alcohol use. Tobacco:  reports that she has quit smoking. She has never used smokeless tobacco. Social: Cynthia Bennett  reports that she has quit smoking. She has never used smokeless tobacco. She reports that she does not drink alcohol and does not use drugs. Medical:  has a past medical history of Dizzy spells, Fibromyalgia, Glaucoma, Hiatal hernia, Osteoarthritis, Osteoarthritis of spine with radiculopathy, lumbar region (08/14/2015), Peptic ulcer disease, Reflux, Spinal stenosis, Stroke ([place]), and TIA (transient ischemic attack). Surgical: Cynthia Bennett  has a past surgical history that includes Esophagogastroduodenoscopy (N/A, 04/16/2015); Appendectomy; and Abdominal hysterectomy. Family: family history includes COPD in her father; Mental illness in her mother.  Laboratory Chemistry Profile   Renal Lab Results  Component Value Date   BUN 13 04/23/2022   CREATININE 1.12 (H) 04/23/2022   BCR 11 (L) 01/10/2019   GFRAA 46 (L) 01/10/2019   GFRNONAA 48 (L) 04/23/2022    Hepatic Lab Results  Component Value Date   AST 18 01/10/2019   ALT 11 (L) 06/24/2016   ALBUMIN 4.3 01/10/2019   ALKPHOS 74 01/10/2019   LIPASE 139 04/27/2014    Electrolytes Lab Results  Component Value Date   NA 138 04/23/2022   K 3.8 04/23/2022   CL 106 04/23/2022   CALCIUM  8.9 04/23/2022   MG 2.3 01/10/2019    Bone Lab Results  Component Value Date   25OHVITD1 26 (L) 01/10/2019   25OHVITD2 1.6 01/10/2019   25OHVITD3 24 01/10/2019    Inflammation (CRP: Acute Phase) (ESR: Chronic Phase) Lab Results  Component Value Date   CRP 0.9 08/24/2020   ESRSEDRATE 51 (H) 01/10/2019         Note: Above Lab results reviewed.  Recent Imaging Review  DG PAIN CLINIC C-ARM 1-60 MIN NO REPORT Fluoro was used, but no Radiologist interpretation will be provided.  Please refer to "NOTES" tab for provider  progress note. Note: Reviewed        Physical Exam  General appearance: Well nourished, well developed, and well hydrated. In no apparent acute distress Mental status: Alert, oriented x 3 (person, place, & time)       Respiratory: No evidence of acute respiratory distress Eyes: PERLA Vitals: There were no vitals taken for this visit. BMI: Estimated body mass index is 25.06 kg/m as calculated from the following:   Height as of 04/23/22: _0  (1.575 m).   Weight as of 04/23/22: 137 lb (62.1 kg). Ideal: Ideal body weight: 50.1 kg (110 lb 7.2 oz) Adjusted ideal body weight: 54.9 kg (121 lb 1.1 oz)  Assessment   Diagnosis Status  1. Chronic pain syndrome   2. Chronic low back pain (1ry area of Pain) (Bilateral) (L>R) w/o sciatica   3. Chronic lower extremity pain (2ry area of Pain) (Left)   4. Chronic hip pain (3ry area of Pain) (Bilateral) (R>L)   5. Pharmacologic therapy   6. Chronic use of opiate for therapeutic purpose   7. Encounter for medication management   8. Encounter for chronic pain management    Controlled Controlled Controlled   Updated Problems: No problems updated.  Plan of Care  Problem-specific:  No problem-specific Assessment & Plan notes found for this encounter.  Cynthia Bennett has a current medication list which includes the following long-term medication(s): bupropion, doxepin, oxycodone, and zolpidem.  Pharmacotherapy (Medications Ordered): No orders of the defined types were placed in this encounter.  Orders:  No orders of the defined types were placed in this encounter.  Follow-up plan:   No follow-ups on file.     Interventional Therapies  Risk  Complexity Considerations:   Estimated body mass index is 24.8 kg/m as calculated from the following:   Height as of this encounter: _1  (1.6 m).   Weight as of this encounter: 140 lb (63.5 kg). Advanced age   Planned  Pending:      Under consideration:       Completed:   Diagnostic  left L5-S1 pseudoarthrosis inj. #1 (01/09/2022) (10/10 to 0/10)  Therapeutic left L4-5 LESI x8 (06/25/2021) (100/100/80/90)  Therapeutic right L4-5 LESI x1 (07/21/2017) (DNR-F/U)  Palliative right L5-S1 LESI x1 (09/27/2015) (100/100/90)  Therapeutic left L5-S1 LESI x1 (02/05/2016) (100/100/100/100)  Diagnostic right SI joint injection x2 (08/25/2019) (100/100/40/30)  Diagnostic left SI joint injection x2 (01/27/2019) (100/100/100/100)  Diagnostic left glenohumeral joint injection x1 (04/29/2017) (100/100/98/98)  Left biceps muscle trigger point injection x1 (07/21/2017) (DNR-F/U)  Right PSIS trigger point injection x1 (07/27/2018) (100/100/50/>50)  Diagnostic right lumbar facet MBB x3 (01/09/2022) (10/10 to 0/10)  Diagnostic left lumbar facet MBB x4 (01/09/2022) (10/10 to 0/10)  Diagnostic left IA hip injection x1 (01/27/2019) (100/100/100/100)    Therapeutic  Palliative (PRN) options:   None established    Recent Visits Date Type Provider Dept  03/19/22 Office Visit Milinda Pointer, MD [facility]  Showing recent visits within past 90 days and meeting all other requirements Future Appointments Date Type Provider Dept  04/28/22 Appointment Milinda Pointer, MD [facility]  Showing future appointments within next 90 days and meeting all other requirements  I discussed the assessment and treatment plan with the patient. The patient was provided an opportunity to ask questions and all were answered. The patient agreed with the plan and demonstrated an understanding of the instructions.  Patient advised to call back or seek an in-person evaluation if the symptoms or condition worsens.  Duration of encounter: *** minutes.  Note by: Cynthia Cola, MD Date: 04/28/2022; Time: 11:06 AM

## 2022-04-28 ENCOUNTER — Other Ambulatory Visit: Payer: Self-pay

## 2022-04-28 ENCOUNTER — Ambulatory Visit: Payer: PPO | Attending: Pain Medicine | Admitting: Pain Medicine

## 2022-04-28 ENCOUNTER — Encounter: Payer: Self-pay | Admitting: Pain Medicine

## 2022-04-28 VITALS — BP 140/60 | HR 100 | Temp 97.1°F | Resp 16 | Ht 62.0 in | Wt 137.0 lb

## 2022-04-28 DIAGNOSIS — M25552 Pain in left hip: Secondary | ICD-10-CM | POA: Insufficient documentation

## 2022-04-28 DIAGNOSIS — G894 Chronic pain syndrome: Secondary | ICD-10-CM | POA: Insufficient documentation

## 2022-04-28 DIAGNOSIS — R262 Difficulty in walking, not elsewhere classified: Secondary | ICD-10-CM | POA: Diagnosis not present

## 2022-04-28 DIAGNOSIS — M545 Low back pain, unspecified: Secondary | ICD-10-CM | POA: Insufficient documentation

## 2022-04-28 DIAGNOSIS — M25551 Pain in right hip: Secondary | ICD-10-CM | POA: Insufficient documentation

## 2022-04-28 DIAGNOSIS — R2689 Other abnormalities of gait and mobility: Secondary | ICD-10-CM | POA: Diagnosis not present

## 2022-04-28 DIAGNOSIS — Z79891 Long term (current) use of opiate analgesic: Secondary | ICD-10-CM | POA: Diagnosis not present

## 2022-04-28 DIAGNOSIS — Z9181 History of falling: Secondary | ICD-10-CM | POA: Diagnosis not present

## 2022-04-28 DIAGNOSIS — M79605 Pain in left leg: Secondary | ICD-10-CM | POA: Insufficient documentation

## 2022-04-28 DIAGNOSIS — Z79899 Other long term (current) drug therapy: Secondary | ICD-10-CM | POA: Insufficient documentation

## 2022-04-28 DIAGNOSIS — G8929 Other chronic pain: Secondary | ICD-10-CM | POA: Diagnosis not present

## 2022-04-28 DIAGNOSIS — M899 Disorder of bone, unspecified: Secondary | ICD-10-CM | POA: Insufficient documentation

## 2022-04-28 MED ORDER — OXYCODONE HCL 5 MG PO TABS: 5.0000 mg | ORAL_TABLET | Freq: Every day | ORAL | 0 refills | Status: DC | PRN

## 2022-04-28 NOTE — Progress Notes (Signed)
Nursing Pain Medication Assessment:  Safety precautions to be maintained throughout the outpatient stay will include: orient to surroundings, keep bed in low position, maintain call bell within reach at all times, provide assistance with transfer out of bed and ambulation.  Medication Inspection Compliance: Pill count conducted under aseptic conditions, in front of the patient. Neither the pills nor the bottle was removed from the patient's sight at any time. Once count was completed pills were immediately returned to the patient in their original bottle.  Medication: Oxycodone IR Pill/Patch Count:  16 of 30 pills remain Pill/Patch Appearance: Markings consistent with prescribed medication Bottle Appearance: Standard pharmacy container. Clearly labeled. Filled Date: 05 / 23 / 2023 Last Medication intake:  Yesterday

## 2022-04-28 NOTE — Patient Instructions (Signed)

## 2022-04-29 DIAGNOSIS — D649 Anemia, unspecified: Secondary | ICD-10-CM | POA: Diagnosis not present

## 2022-04-29 DIAGNOSIS — E785 Hyperlipidemia, unspecified: Secondary | ICD-10-CM | POA: Diagnosis not present

## 2022-04-29 DIAGNOSIS — E669 Obesity, unspecified: Secondary | ICD-10-CM | POA: Diagnosis not present

## 2022-04-29 DIAGNOSIS — K2270 Barrett's esophagus without dysplasia: Secondary | ICD-10-CM | POA: Diagnosis not present

## 2022-04-29 DIAGNOSIS — K649 Unspecified hemorrhoids: Secondary | ICD-10-CM | POA: Diagnosis not present

## 2022-04-29 DIAGNOSIS — K219 Gastro-esophageal reflux disease without esophagitis: Secondary | ICD-10-CM | POA: Diagnosis not present

## 2022-04-29 DIAGNOSIS — Z8673 Personal history of transient ischemic attack (TIA), and cerebral infarction without residual deficits: Secondary | ICD-10-CM | POA: Diagnosis not present

## 2022-04-29 DIAGNOSIS — Z86010 Personal history of colonic polyps: Secondary | ICD-10-CM | POA: Diagnosis not present

## 2022-04-29 DIAGNOSIS — K5909 Other constipation: Secondary | ICD-10-CM | POA: Diagnosis not present

## 2022-04-29 DIAGNOSIS — M797 Fibromyalgia: Secondary | ICD-10-CM | POA: Diagnosis not present

## 2022-04-29 DIAGNOSIS — K469 Unspecified abdominal hernia without obstruction or gangrene: Secondary | ICD-10-CM | POA: Diagnosis not present

## 2022-04-29 DIAGNOSIS — G4700 Insomnia, unspecified: Secondary | ICD-10-CM | POA: Diagnosis not present

## 2022-04-29 DIAGNOSIS — Z8701 Personal history of pneumonia (recurrent): Secondary | ICD-10-CM | POA: Diagnosis not present

## 2022-04-29 DIAGNOSIS — I2510 Atherosclerotic heart disease of native coronary artery without angina pectoris: Secondary | ICD-10-CM | POA: Diagnosis not present

## 2022-04-29 DIAGNOSIS — K5790 Diverticulosis of intestine, part unspecified, without perforation or abscess without bleeding: Secondary | ICD-10-CM | POA: Diagnosis not present

## 2022-04-29 DIAGNOSIS — Z720 Tobacco use: Secondary | ICD-10-CM | POA: Diagnosis not present

## 2022-04-29 DIAGNOSIS — J45909 Unspecified asthma, uncomplicated: Secondary | ICD-10-CM | POA: Diagnosis not present

## 2022-04-29 DIAGNOSIS — M1990 Unspecified osteoarthritis, unspecified site: Secondary | ICD-10-CM | POA: Diagnosis not present

## 2022-04-29 DIAGNOSIS — H401190 Primary open-angle glaucoma, unspecified eye, stage unspecified: Secondary | ICD-10-CM | POA: Diagnosis not present

## 2022-04-29 DIAGNOSIS — Z9181 History of falling: Secondary | ICD-10-CM | POA: Diagnosis not present

## 2022-04-30 ENCOUNTER — Inpatient Hospital Stay: Payer: PPO

## 2022-04-30 VITALS — BP 108/65 | HR 70 | Temp 99.1°F | Resp 17

## 2022-04-30 DIAGNOSIS — D631 Anemia in chronic kidney disease: Secondary | ICD-10-CM | POA: Diagnosis not present

## 2022-04-30 DIAGNOSIS — E611 Iron deficiency: Secondary | ICD-10-CM

## 2022-04-30 MED ORDER — IRON SUCROSE 20 MG/ML IV SOLN
200.0000 mg | Freq: Once | INTRAVENOUS | Status: AC
  Administered 2022-04-30: 200 mg via INTRAVENOUS
  Filled 2022-04-30: qty 10

## 2022-04-30 MED ORDER — SODIUM CHLORIDE 0.9 % IV SOLN: 200.0000 mg | Freq: Once | INTRAVENOUS | Status: DC

## 2022-04-30 MED ORDER — SODIUM CHLORIDE 0.9 % IV SOLN
Freq: Once | INTRAVENOUS | Status: AC
  Filled 2022-04-30: qty 250

## 2022-05-01 LAB — TOXASSURE SELECT 13 (MW), URINE

## 2022-05-06 DIAGNOSIS — M797 Fibromyalgia: Secondary | ICD-10-CM | POA: Diagnosis not present

## 2022-05-06 DIAGNOSIS — K2270 Barrett's esophagus without dysplasia: Secondary | ICD-10-CM | POA: Diagnosis not present

## 2022-05-06 DIAGNOSIS — H401190 Primary open-angle glaucoma, unspecified eye, stage unspecified: Secondary | ICD-10-CM | POA: Diagnosis not present

## 2022-05-06 DIAGNOSIS — Z720 Tobacco use: Secondary | ICD-10-CM | POA: Diagnosis not present

## 2022-05-06 DIAGNOSIS — Z86010 Personal history of colonic polyps: Secondary | ICD-10-CM | POA: Diagnosis not present

## 2022-05-06 DIAGNOSIS — M1990 Unspecified osteoarthritis, unspecified site: Secondary | ICD-10-CM | POA: Diagnosis not present

## 2022-05-06 DIAGNOSIS — K5790 Diverticulosis of intestine, part unspecified, without perforation or abscess without bleeding: Secondary | ICD-10-CM | POA: Diagnosis not present

## 2022-05-06 DIAGNOSIS — E669 Obesity, unspecified: Secondary | ICD-10-CM | POA: Diagnosis not present

## 2022-05-06 DIAGNOSIS — D649 Anemia, unspecified: Secondary | ICD-10-CM | POA: Diagnosis not present

## 2022-05-06 DIAGNOSIS — E785 Hyperlipidemia, unspecified: Secondary | ICD-10-CM | POA: Diagnosis not present

## 2022-05-06 DIAGNOSIS — I2510 Atherosclerotic heart disease of native coronary artery without angina pectoris: Secondary | ICD-10-CM | POA: Diagnosis not present

## 2022-05-06 DIAGNOSIS — K469 Unspecified abdominal hernia without obstruction or gangrene: Secondary | ICD-10-CM | POA: Diagnosis not present

## 2022-05-06 DIAGNOSIS — Z8701 Personal history of pneumonia (recurrent): Secondary | ICD-10-CM | POA: Diagnosis not present

## 2022-05-06 DIAGNOSIS — Z9181 History of falling: Secondary | ICD-10-CM | POA: Diagnosis not present

## 2022-05-06 DIAGNOSIS — Z8673 Personal history of transient ischemic attack (TIA), and cerebral infarction without residual deficits: Secondary | ICD-10-CM | POA: Diagnosis not present

## 2022-05-06 DIAGNOSIS — K5909 Other constipation: Secondary | ICD-10-CM | POA: Diagnosis not present

## 2022-05-06 DIAGNOSIS — J45909 Unspecified asthma, uncomplicated: Secondary | ICD-10-CM | POA: Diagnosis not present

## 2022-05-06 DIAGNOSIS — K649 Unspecified hemorrhoids: Secondary | ICD-10-CM | POA: Diagnosis not present

## 2022-05-06 DIAGNOSIS — K219 Gastro-esophageal reflux disease without esophagitis: Secondary | ICD-10-CM | POA: Diagnosis not present

## 2022-05-06 DIAGNOSIS — G4700 Insomnia, unspecified: Secondary | ICD-10-CM | POA: Diagnosis not present

## 2022-05-07 ENCOUNTER — Inpatient Hospital Stay: Payer: PPO

## 2022-05-07 ENCOUNTER — Other Ambulatory Visit: Payer: PPO

## 2022-05-07 ENCOUNTER — Ambulatory Visit: Payer: PPO | Admitting: Medical Oncology

## 2022-05-07 ENCOUNTER — Inpatient Hospital Stay (HOSPITAL_BASED_OUTPATIENT_CLINIC_OR_DEPARTMENT_OTHER): Payer: PPO | Admitting: Medical Oncology

## 2022-05-07 ENCOUNTER — Ambulatory Visit: Payer: PPO

## 2022-05-07 VITALS — BP 110/66 | HR 63

## 2022-05-07 VITALS — BP 114/70 | HR 71 | Temp 99.4°F | Resp 16

## 2022-05-07 DIAGNOSIS — D631 Anemia in chronic kidney disease: Secondary | ICD-10-CM | POA: Diagnosis not present

## 2022-05-07 DIAGNOSIS — N1832 Chronic kidney disease, stage 3b: Secondary | ICD-10-CM

## 2022-05-07 DIAGNOSIS — E611 Iron deficiency: Secondary | ICD-10-CM

## 2022-05-07 DIAGNOSIS — R5383 Other fatigue: Secondary | ICD-10-CM | POA: Diagnosis not present

## 2022-05-07 LAB — CBC WITH DIFFERENTIAL/PLATELET
Abs Immature Granulocytes: 0.04 10*3/uL (ref 0.00–0.07)
Basophils Absolute: 0.1 10*3/uL (ref 0.0–0.1)
Basophils Relative: 1 %
Eosinophils Absolute: 0.4 10*3/uL (ref 0.0–0.5)
Eosinophils Relative: 5 %
HCT: 32.5 % — ABNORMAL LOW (ref 36.0–46.0)
Hemoglobin: 9.3 g/dL — ABNORMAL LOW (ref 12.0–15.0)
Immature Granulocytes: 1 %
Lymphocytes Relative: 24 %
Lymphs Abs: 1.8 10*3/uL (ref 0.7–4.0)
MCH: 23.5 pg — ABNORMAL LOW (ref 26.0–34.0)
MCHC: 28.6 g/dL — ABNORMAL LOW (ref 30.0–36.0)
MCV: 82.1 fL (ref 80.0–100.0)
Monocytes Absolute: 0.6 10*3/uL (ref 0.1–1.0)
Monocytes Relative: 7 %
Neutro Abs: 4.8 10*3/uL (ref 1.7–7.7)
Neutrophils Relative %: 62 %
Platelets: 358 10*3/uL (ref 150–400)
RBC: 3.96 MIL/uL (ref 3.87–5.11)
RDW: 22.5 % — ABNORMAL HIGH (ref 11.5–15.5)
WBC: 7.7 10*3/uL (ref 4.0–10.5)
nRBC: 0 % (ref 0.0–0.2)

## 2022-05-07 LAB — BASIC METABOLIC PANEL
Anion gap: 3 — ABNORMAL LOW (ref 5–15)
BUN: 16 mg/dL (ref 8–23)
CO2: 25 mmol/L (ref 22–32)
Calcium: 7.9 mg/dL — ABNORMAL LOW (ref 8.9–10.3)
Chloride: 107 mmol/L (ref 98–111)
Creatinine, Ser: 1.14 mg/dL — ABNORMAL HIGH (ref 0.44–1.00)
GFR, Estimated: 47 mL/min — ABNORMAL LOW (ref >60)
Glucose, Bld: 119 mg/dL — ABNORMAL HIGH (ref 70–99)
Potassium: 3.8 mmol/L (ref 3.5–5.1)
Sodium: 135 mmol/L (ref 135–145)

## 2022-05-07 LAB — IRON AND TIBC
Iron: 36 ug/dL (ref 28–170)
Saturation Ratios: 11 % (ref 10.4–31.8)
TIBC: 319 ug/dL (ref 250–450)
UIBC: 283 ug/dL

## 2022-05-07 LAB — FERRITIN: Ferritin: 137 ng/mL (ref 11–307)

## 2022-05-07 MED ORDER — IRON SUCROSE 20 MG/ML IV SOLN
200.0000 mg | Freq: Once | INTRAVENOUS | Status: AC
  Administered 2022-05-07: 200 mg via INTRAVENOUS
  Filled 2022-05-07: qty 10

## 2022-05-07 MED ORDER — SODIUM CHLORIDE 0.9 % IV SOLN
Freq: Once | INTRAVENOUS | Status: AC
  Filled 2022-05-07: qty 250

## 2022-05-07 MED ORDER — SODIUM CHLORIDE 0.9 % IV SOLN: 200.0000 mg | Freq: Once | INTRAVENOUS | Status: DC

## 2022-05-07 NOTE — Patient Instructions (Signed)

## 2022-05-07 NOTE — Progress Notes (Unsigned)
Pt returns for follow-up. Reports fatigue and weakness. Gets out of breath walking to the mailbox and back. Scheduled for venofer today.

## 2022-05-08 ENCOUNTER — Encounter: Payer: Self-pay | Admitting: Internal Medicine

## 2022-05-08 NOTE — Progress Notes (Signed)
[facility] CONSULT NOTE  Patient Care Team: Jerl Mina, MD as PCP - General (Family Medicine) Earna Coder, MD as Consulting Physician (Hematology and Oncology)  CHIEF COMPLAINTS/PURPOSE OF CONSULTATION: Anemia   HEMATOLOGY HISTORY  # CHRONIC NORMOCYTIC ANEMIA [since 2019-PCP- Hb 8-9; Iron sat- 5%;NO Ferritin; GFR- 43]EGD/colonoscopy-?2013-2104- pt declines procedures; Intolerance to p.o. iron. IV Venofer-  #CKD stage III/chronic pain back-pain clinic narcotics.  HISTORY OF PRESENTING ILLNESS: In a wheelchair.  Accompanied by daughter.  Cynthia Bennett 88 y.o.  female history of anemia secondary ?chronic kidney disease is here for follow-up.  At her last visit she was started on weekly Venofer for IDA suspected to be due to chronic blood loss. She was instructed to follow up with GI as well. She reports that overall she is feeling a bit less tired. Tolerating the Venofer well. Has GI appointment in a few months but is on wait list for earlier appointment- also discussed that she likely would not want ant procedures performed to check for bleed at this time. No new bleeding episodes.    Review of Systems  Constitutional:  Positive for malaise/fatigue. Negative for chills, diaphoresis and fever.  HENT:  Negative for nosebleeds and sore throat.   Eyes:  Negative for double vision.  Respiratory:  Negative for cough, hemoptysis, sputum production, shortness of breath and wheezing.   Cardiovascular:  Negative for chest pain, palpitations, orthopnea and leg swelling.  Gastrointestinal:  Negative for abdominal pain, blood in stool, constipation, diarrhea, heartburn, melena, nausea and vomiting.  Genitourinary:  Negative for dysuria, frequency and urgency.  Musculoskeletal:  Positive for back pain and joint pain.  Skin: Negative.  Negative for itching and rash.  Neurological:  Negative for dizziness, tingling, focal weakness, weakness and headaches.   Endo/Heme/Allergies:  Does not bruise/bleed easily.  Psychiatric/Behavioral:  Negative for depression. The patient is not nervous/anxious and does not have insomnia.     MEDICAL HISTORY:  Past Medical History:  Diagnosis Date   Dizzy spells    Fibromyalgia    Glaucoma    Hiatal hernia    Osteoarthritis    Osteoarthritis of spine with radiculopathy, lumbar region 08/14/2015   Peptic ulcer disease    Reflux    Spinal stenosis    Stroke (HCC)    TIA (transient ischemic attack)     SURGICAL HISTORY: Past Surgical History:  Procedure Laterality Date   ABDOMINAL HYSTERECTOMY     APPENDECTOMY     ESOPHAGOGASTRODUODENOSCOPY N/A 04/16/2015   Procedure: ESOPHAGOGASTRODUODENOSCOPY (EGD);  Surgeon: Wallace Cullens, MD;  Location: [facility] ENDOSCOPY;  Service: Gastroenterology;  Laterality: N/A;    SOCIAL HISTORY: Social History   Socioeconomic History   Marital status: Widowed    Spouse name: Not on file   Number of children: Not on file   Years of education: Not on file   Highest education level: Not on file  Occupational History   Not on file  Tobacco Use   Smoking status: Former   Smokeless tobacco: Never  Vaping Use   Vaping Use: Never used  Substance and Sexual Activity   Alcohol use: No    Alcohol/week: 0.0 standard drinks of alcohol   Drug use: No   Sexual activity: Not on file  Other Topics Concern   Not on file  Social History Narrative   Pt lives in [place]; with son. Quit smoking in 1957. No alcohol. Designed clothes/ alterations.    Social Determinants of Health   Financial Resource Strain:  Not on file  Food Insecurity: Not on file  Transportation Needs: Not on file  Physical Activity: Not on file  Stress: Not on file  Social Connections: Not on file  Intimate Partner Violence: Not on file    FAMILY HISTORY: Family History  Problem Relation Age of Onset   Mental illness Mother    COPD Father     ALLERGIES:  is allergic to ibuprofen.  MEDICATIONS:   Current Outpatient Medications  Medication Sig Dispense Refill   acetaminophen (TYLENOL) 325 MG tablet Take 650 mg by mouth every 6 (six) hours as needed.     buPROPion (WELLBUTRIN SR) 150 MG 12 hr tablet TAKE 1 TABLET BY MOUTH ONCE DAILY     Cholecalciferol (VITAMIN D3) 20 MCG (800 UNIT) TABS Take by mouth 2 (two) times daily.     dorzolamide-timolol (COSOPT) 22.3-6.8 MG/ML ophthalmic solution Place 1 drop into both eyes in the morning and at bedtime.     doxepin (SINEQUAN) 100 MG capsule Take 100 mg by mouth at bedtime.     esomeprazole (NEXIUM) 40 MG capsule Take 40 mg by mouth 2 (two) times daily.     magnesium oxide (MAG-OX) 400 MG tablet Take by mouth.     ondansetron (ZOFRAN) 8 MG tablet TAKE 1 TABLET BY MOUTH EVERY 8 HOURS AS NEEDED FOR NAUSEA AND VOMITING 45 tablet 1   oxyCODONE (OXY IR/ROXICODONE) 5 MG immediate release tablet Take 1 tablet (5 mg total) by mouth daily as needed for severe pain. Must last 30 days 30 tablet 0   [START ON 05/31/2022] oxyCODONE (OXY IR/ROXICODONE) 5 MG immediate release tablet Take 1 tablet (5 mg total) by mouth daily as needed for severe pain. Must last 30 days 30 tablet 0   [START ON 06/30/2022] oxyCODONE (OXY IR/ROXICODONE) 5 MG immediate release tablet Take 1 tablet (5 mg total) by mouth daily as needed for severe pain. Must last 30 days 30 tablet 0   sertraline (ZOLOFT) 100 MG tablet Take 100 mg by mouth daily.     zolpidem (AMBIEN) 10 MG tablet TAKE 1/2 TO 1 TABLET BY MOUTH EVERY NIGHT AT BEDTIME     No current facility-administered medications for this visit.      PHYSICAL EXAMINATION:   Vitals:   05/07/22 1329  BP: 114/70  Pulse: 71  Resp: 16  Temp: 99.4 F (37.4 C)  SpO2: 99%   There were no vitals filed for this visit.   Physical Exam HENT:     Head: Normocephalic and atraumatic.     Mouth/Throat:     Pharynx: No oropharyngeal exudate.  Eyes:     Pupils: Pupils are equal, round, and reactive to light.     Comments: pallor   Cardiovascular:     Rate and Rhythm: Normal rate and regular rhythm.  Pulmonary:     Effort: Pulmonary effort is normal. No respiratory distress.     Breath sounds: Normal breath sounds. No wheezing.  Abdominal:     General: Bowel sounds are normal. There is no distension.     Palpations: Abdomen is soft. There is no mass.     Tenderness: There is no abdominal tenderness. There is no guarding or rebound.  Musculoskeletal:        General: No tenderness. Normal range of motion.     Cervical back: Normal range of motion and neck supple.  Skin:    General: Skin is warm.     Coloration: Skin is not pale.  Neurological:  Mental Status: She is alert and oriented to person, place, and time.  Psychiatric:        Mood and Affect: Affect normal.     LABORATORY DATA:  I have reviewed the data as listed Lab Results  Component Value Date   WBC 7.7 05/07/2022   HGB 9.3 (L) 05/07/2022   HCT 32.5 (L) 05/07/2022   MCV 82.1 05/07/2022   PLT 358 05/07/2022   Recent Labs    01/15/22 1250 04/23/22 1310 05/07/22 1256  NA 135 138 135  K 3.8 3.8 3.8  CL 104 106 107  CO2 24 23 25   GLUCOSE 133* 148* 119*  BUN 24* 13 16  CREATININE 1.06* 1.12* 1.14*  CALCIUM 9.1 8.9 7.9*  GFRNONAA 51* 48* 47*   No results found.  No problem-specific Assessment & Plan notes found for this encounter.  Encounter Diagnoses  Name Primary?   Iron deficiency Yes   Other fatigue    IDA is improving with the Venofer. She is tolerating this well. Our plan is to continue weekly Venofer for a total of 5 treatments. Contineu hydration with water. Discussed GI indication as well. Follow up in 3 weeks for labs and MD with weekly Venofer until then.     All questions were answered. The patient knows to call the clinic with any problems, questions or concerns.      , PA-C 05/08/2022 10:03 AM

## 2022-05-12 DIAGNOSIS — K219 Gastro-esophageal reflux disease without esophagitis: Secondary | ICD-10-CM | POA: Diagnosis not present

## 2022-05-12 DIAGNOSIS — E669 Obesity, unspecified: Secondary | ICD-10-CM | POA: Diagnosis not present

## 2022-05-12 DIAGNOSIS — J45909 Unspecified asthma, uncomplicated: Secondary | ICD-10-CM | POA: Diagnosis not present

## 2022-05-12 DIAGNOSIS — K5909 Other constipation: Secondary | ICD-10-CM | POA: Diagnosis not present

## 2022-05-12 DIAGNOSIS — H401190 Primary open-angle glaucoma, unspecified eye, stage unspecified: Secondary | ICD-10-CM | POA: Diagnosis not present

## 2022-05-12 DIAGNOSIS — M1990 Unspecified osteoarthritis, unspecified site: Secondary | ICD-10-CM | POA: Diagnosis not present

## 2022-05-12 DIAGNOSIS — Z8673 Personal history of transient ischemic attack (TIA), and cerebral infarction without residual deficits: Secondary | ICD-10-CM | POA: Diagnosis not present

## 2022-05-12 DIAGNOSIS — K469 Unspecified abdominal hernia without obstruction or gangrene: Secondary | ICD-10-CM | POA: Diagnosis not present

## 2022-05-12 DIAGNOSIS — M797 Fibromyalgia: Secondary | ICD-10-CM | POA: Diagnosis not present

## 2022-05-12 DIAGNOSIS — Z8701 Personal history of pneumonia (recurrent): Secondary | ICD-10-CM | POA: Diagnosis not present

## 2022-05-12 DIAGNOSIS — G4700 Insomnia, unspecified: Secondary | ICD-10-CM | POA: Diagnosis not present

## 2022-05-12 DIAGNOSIS — D649 Anemia, unspecified: Secondary | ICD-10-CM | POA: Diagnosis not present

## 2022-05-12 DIAGNOSIS — K2270 Barrett's esophagus without dysplasia: Secondary | ICD-10-CM | POA: Diagnosis not present

## 2022-05-12 DIAGNOSIS — Z720 Tobacco use: Secondary | ICD-10-CM | POA: Diagnosis not present

## 2022-05-12 DIAGNOSIS — K649 Unspecified hemorrhoids: Secondary | ICD-10-CM | POA: Diagnosis not present

## 2022-05-12 DIAGNOSIS — E785 Hyperlipidemia, unspecified: Secondary | ICD-10-CM | POA: Diagnosis not present

## 2022-05-12 DIAGNOSIS — K5790 Diverticulosis of intestine, part unspecified, without perforation or abscess without bleeding: Secondary | ICD-10-CM | POA: Diagnosis not present

## 2022-05-12 DIAGNOSIS — I2510 Atherosclerotic heart disease of native coronary artery without angina pectoris: Secondary | ICD-10-CM | POA: Diagnosis not present

## 2022-05-12 DIAGNOSIS — Z86010 Personal history of colonic polyps: Secondary | ICD-10-CM | POA: Diagnosis not present

## 2022-05-12 DIAGNOSIS — Z9181 History of falling: Secondary | ICD-10-CM | POA: Diagnosis not present

## 2022-05-14 ENCOUNTER — Inpatient Hospital Stay: Payer: PPO | Attending: Internal Medicine

## 2022-05-14 DIAGNOSIS — G8929 Other chronic pain: Secondary | ICD-10-CM | POA: Insufficient documentation

## 2022-05-14 DIAGNOSIS — R5383 Other fatigue: Secondary | ICD-10-CM | POA: Insufficient documentation

## 2022-05-14 DIAGNOSIS — N1832 Chronic kidney disease, stage 3b: Secondary | ICD-10-CM | POA: Insufficient documentation

## 2022-05-14 DIAGNOSIS — D631 Anemia in chronic kidney disease: Secondary | ICD-10-CM | POA: Insufficient documentation

## 2022-05-14 DIAGNOSIS — M549 Dorsalgia, unspecified: Secondary | ICD-10-CM | POA: Insufficient documentation

## 2022-05-14 DIAGNOSIS — M2550 Pain in unspecified joint: Secondary | ICD-10-CM | POA: Insufficient documentation

## 2022-05-14 DIAGNOSIS — Z818 Family history of other mental and behavioral disorders: Secondary | ICD-10-CM | POA: Insufficient documentation

## 2022-05-14 DIAGNOSIS — Z79899 Other long term (current) drug therapy: Secondary | ICD-10-CM | POA: Insufficient documentation

## 2022-05-14 DIAGNOSIS — Z8673 Personal history of transient ischemic attack (TIA), and cerebral infarction without residual deficits: Secondary | ICD-10-CM | POA: Insufficient documentation

## 2022-05-14 DIAGNOSIS — Z836 Family history of other diseases of the respiratory system: Secondary | ICD-10-CM | POA: Insufficient documentation

## 2022-05-14 DIAGNOSIS — Z87891 Personal history of nicotine dependence: Secondary | ICD-10-CM | POA: Insufficient documentation

## 2022-05-14 DIAGNOSIS — Z9049 Acquired absence of other specified parts of digestive tract: Secondary | ICD-10-CM | POA: Insufficient documentation

## 2022-05-14 DIAGNOSIS — Z886 Allergy status to analgesic agent status: Secondary | ICD-10-CM | POA: Insufficient documentation

## 2022-05-14 MED FILL — Iron Sucrose Inj 20 MG/ML (Fe Equiv): INTRAVENOUS | Qty: 10 | Status: AC

## 2022-05-14 NOTE — Progress Notes (Signed)
05/07/22 Ferritin 137 and iron studies within limits. Per Clent Jacks PA hold Venofer at this time and pt to keep follow up as scheduled 05/20/22. Pt aware and verbalizes understanding.

## 2022-05-19 ENCOUNTER — Other Ambulatory Visit: Payer: Self-pay

## 2022-05-19 DIAGNOSIS — E611 Iron deficiency: Secondary | ICD-10-CM

## 2022-05-20 ENCOUNTER — Encounter: Payer: Self-pay | Admitting: Internal Medicine

## 2022-05-20 ENCOUNTER — Inpatient Hospital Stay: Payer: PPO | Admitting: Internal Medicine

## 2022-05-20 ENCOUNTER — Inpatient Hospital Stay: Payer: PPO

## 2022-05-20 ENCOUNTER — Ambulatory Visit: Payer: PPO | Admitting: Medical Oncology

## 2022-05-20 VITALS — BP 116/66 | HR 66

## 2022-05-20 DIAGNOSIS — G8929 Other chronic pain: Secondary | ICD-10-CM | POA: Diagnosis not present

## 2022-05-20 DIAGNOSIS — Z818 Family history of other mental and behavioral disorders: Secondary | ICD-10-CM | POA: Diagnosis not present

## 2022-05-20 DIAGNOSIS — N1832 Chronic kidney disease, stage 3b: Secondary | ICD-10-CM | POA: Diagnosis not present

## 2022-05-20 DIAGNOSIS — E611 Iron deficiency: Secondary | ICD-10-CM

## 2022-05-20 DIAGNOSIS — Z87891 Personal history of nicotine dependence: Secondary | ICD-10-CM | POA: Diagnosis not present

## 2022-05-20 DIAGNOSIS — D631 Anemia in chronic kidney disease: Secondary | ICD-10-CM

## 2022-05-20 DIAGNOSIS — M2550 Pain in unspecified joint: Secondary | ICD-10-CM | POA: Diagnosis not present

## 2022-05-20 DIAGNOSIS — R5383 Other fatigue: Secondary | ICD-10-CM | POA: Diagnosis not present

## 2022-05-20 DIAGNOSIS — Z836 Family history of other diseases of the respiratory system: Secondary | ICD-10-CM | POA: Diagnosis not present

## 2022-05-20 DIAGNOSIS — Z886 Allergy status to analgesic agent status: Secondary | ICD-10-CM | POA: Diagnosis not present

## 2022-05-20 DIAGNOSIS — M549 Dorsalgia, unspecified: Secondary | ICD-10-CM | POA: Diagnosis not present

## 2022-05-20 DIAGNOSIS — Z8673 Personal history of transient ischemic attack (TIA), and cerebral infarction without residual deficits: Secondary | ICD-10-CM | POA: Diagnosis not present

## 2022-05-20 DIAGNOSIS — Z9049 Acquired absence of other specified parts of digestive tract: Secondary | ICD-10-CM | POA: Diagnosis not present

## 2022-05-20 DIAGNOSIS — Z79899 Other long term (current) drug therapy: Secondary | ICD-10-CM | POA: Diagnosis not present

## 2022-05-20 LAB — CBC WITH DIFFERENTIAL/PLATELET
Abs Immature Granulocytes: 0.02 10*3/uL (ref 0.00–0.07)
Basophils Absolute: 0.1 10*3/uL (ref 0.0–0.1)
Basophils Relative: 1 %
Eosinophils Absolute: 0.3 10*3/uL (ref 0.0–0.5)
Eosinophils Relative: 4 %
HCT: 36.1 % (ref 36.0–46.0)
Hemoglobin: 10.9 g/dL — ABNORMAL LOW (ref 12.0–15.0)
Immature Granulocytes: 0 %
Lymphocytes Relative: 29 %
Lymphs Abs: 2.1 10*3/uL (ref 0.7–4.0)
MCH: 24.8 pg — ABNORMAL LOW (ref 26.0–34.0)
MCHC: 30.2 g/dL (ref 30.0–36.0)
MCV: 82.2 fL (ref 80.0–100.0)
Monocytes Absolute: 0.5 10*3/uL (ref 0.1–1.0)
Monocytes Relative: 7 %
Neutro Abs: 4.2 10*3/uL (ref 1.7–7.7)
Neutrophils Relative %: 59 %
Platelets: 353 10*3/uL (ref 150–400)
RBC: 4.39 MIL/uL (ref 3.87–5.11)
RDW: 23.1 % — ABNORMAL HIGH (ref 11.5–15.5)
WBC: 7.2 10*3/uL (ref 4.0–10.5)
nRBC: 0 % (ref 0.0–0.2)

## 2022-05-20 LAB — IRON AND TIBC
Iron: 49 ug/dL (ref 28–170)
Saturation Ratios: 15 % (ref 10.4–31.8)
TIBC: 323 ug/dL (ref 250–450)
UIBC: 274 ug/dL

## 2022-05-20 LAB — BASIC METABOLIC PANEL
Anion gap: 6 (ref 5–15)
BUN: 17 mg/dL (ref 8–23)
CO2: 22 mmol/L (ref 22–32)
Calcium: 8.5 mg/dL — ABNORMAL LOW (ref 8.9–10.3)
Chloride: 106 mmol/L (ref 98–111)
Creatinine, Ser: 1.05 mg/dL — ABNORMAL HIGH (ref 0.44–1.00)
GFR, Estimated: 51 mL/min — ABNORMAL LOW (ref >60)
Glucose, Bld: 99 mg/dL (ref 70–99)
Potassium: 3.7 mmol/L (ref 3.5–5.1)
Sodium: 134 mmol/L — ABNORMAL LOW (ref 135–145)

## 2022-05-20 LAB — FERRITIN: Ferritin: 117 ng/mL (ref 11–307)

## 2022-05-20 MED ORDER — SODIUM CHLORIDE 0.9 % IV SOLN
200.0000 mg | Freq: Once | INTRAVENOUS | Status: AC
  Administered 2022-05-20: 200 mg via INTRAVENOUS
  Filled 2022-05-20: qty 10

## 2022-05-20 MED ORDER — SODIUM CHLORIDE 0.9 % IV SOLN
Freq: Once | INTRAVENOUS | Status: AC
  Filled 2022-05-20: qty 250

## 2022-05-20 NOTE — Assessment & Plan Note (Addendum)
#   Anemia iron def/CKD stageIII : Hb-8-9 [MCV- 77] s/p IV iron infusion hemoglobin to 10.9.  Proceed with Venofer today  # Etiology: Unclear etiology.  GI versus CKD.  colo > 10 years ago; patient reluctant with GI procedures in the past.  Currently awaiting referral with GI- Dr.Wohl in October.   # CKDstage III-STABLE  # Constipation: ? Narcotics-defer to PCP/pain clinic- STABLE   # Chronic back pain/? [facility] [Dr.Neivera]- STABLE  pt [phone]-call  # DISPOSITION:  # venofer today # in 1 week Venofer  # follow up in last week of Oct 2023- MD; ; labs- cbc/bmp/iron studies/ferritin-- possible IVvenofer- Dr.B  Cc; Dr.Hedrick

## 2022-05-20 NOTE — Progress Notes (Signed)
Patient denies new problems/concerns today.   °

## 2022-05-20 NOTE — Addendum Note (Signed)
Addended by: Guerry Minors on: 05/20/2022 02:20 PM   Modules accepted: Orders

## 2022-05-20 NOTE — Progress Notes (Signed)
[facility] CONSULT NOTE  Patient Care Team: Jerl Mina, MD as PCP - General (Family Medicine) Earna Coder, MD as Consulting Physician (Hematology and Oncology)  CHIEF COMPLAINTS/PURPOSE OF CONSULTATION: Anemia   HEMATOLOGY HISTORY  # CHRONIC NORMOCYTIC ANEMIA [since 2019-PCP- Hb 8-9; Iron sat- 5%;NO Ferritin; GFR- 43]EGD/colonoscopy-?2013-2104- pt declines procedures; Intolerance to p.o. iron. IV Venofer-  #CKD stage III/chronic pain back-pain clinic narcotics.  HISTORY OF PRESENTING ILLNESS: In a wheelchair.  Accompanied by daughter.  Cynthia Bennett 88 y.o.  female history of anemia secondary ?chronic kidney disease vs others is here for follow-up.  Patient s/p iron infusions x3 so far.  No slight improvement of energy levels.  Denies any blood in stools or black or stools.   Review of Systems  Constitutional:  Positive for malaise/fatigue. Negative for chills, diaphoresis and fever.  HENT:  Negative for nosebleeds and sore throat.   Eyes:  Negative for double vision.  Respiratory:  Negative for cough, hemoptysis, sputum production, shortness of breath and wheezing.   Cardiovascular:  Negative for chest pain, palpitations, orthopnea and leg swelling.  Gastrointestinal:  Negative for abdominal pain, blood in stool, constipation, diarrhea, heartburn, melena, nausea and vomiting.  Genitourinary:  Negative for dysuria, frequency and urgency.  Musculoskeletal:  Positive for back pain and joint pain.  Skin: Negative.  Negative for itching and rash.  Neurological:  Negative for dizziness, tingling, focal weakness, weakness and headaches.  Endo/Heme/Allergies:  Does not bruise/bleed easily.  Psychiatric/Behavioral:  Negative for depression. The patient is not nervous/anxious and does not have insomnia.     MEDICAL HISTORY:  Past Medical History:  Diagnosis Date   Dizzy spells    Fibromyalgia    Glaucoma    Hiatal hernia    Osteoarthritis     Osteoarthritis of spine with radiculopathy, lumbar region 08/14/2015   Peptic ulcer disease    Reflux    Spinal stenosis    Stroke (HCC)    TIA (transient ischemic attack)     SURGICAL HISTORY: Past Surgical History:  Procedure Laterality Date   ABDOMINAL HYSTERECTOMY     APPENDECTOMY     ESOPHAGOGASTRODUODENOSCOPY N/A 04/16/2015   Procedure: ESOPHAGOGASTRODUODENOSCOPY (EGD);  Surgeon: Wallace Cullens, MD;  Location: [facility] ENDOSCOPY;  Service: Gastroenterology;  Laterality: N/A;    SOCIAL HISTORY: Social History   Socioeconomic History   Marital status: Widowed    Spouse name: Not on file   Number of children: Not on file   Years of education: Not on file   Highest education level: Not on file  Occupational History   Not on file  Tobacco Use   Smoking status: Former   Smokeless tobacco: Never  Vaping Use   Vaping Use: Never used  Substance and Sexual Activity   Alcohol use: No    Alcohol/week: 0.0 standard drinks of alcohol   Drug use: No   Sexual activity: Not on file  Other Topics Concern   Not on file  Social History Narrative   Pt lives in [place]; with son. Quit smoking in 1957. No alcohol. Designed clothes/ alterations.    Social Determinants of Health   Financial Resource Strain: Not on file  Food Insecurity: Not on file  Transportation Needs: Not on file  Physical Activity: Not on file  Stress: Not on file  Social Connections: Not on file  Intimate Partner Violence: Not on file    FAMILY HISTORY: Family History  Problem Relation Age of Onset   Mental illness Mother  COPD Father     ALLERGIES:  is allergic to ibuprofen.  MEDICATIONS:  Current Outpatient Medications  Medication Sig Dispense Refill   acetaminophen (TYLENOL) 325 MG tablet Take 650 mg by mouth every 6 (six) hours as needed.     buPROPion (WELLBUTRIN SR) 150 MG 12 hr tablet TAKE 1 TABLET BY MOUTH ONCE DAILY     Cholecalciferol (VITAMIN D3) 20 MCG (800 UNIT) TABS Take by mouth 2 (two) times  daily.     dorzolamide-timolol (COSOPT) 22.3-6.8 MG/ML ophthalmic solution Place 1 drop into both eyes in the morning and at bedtime.     doxepin (SINEQUAN) 100 MG capsule Take 100 mg by mouth at bedtime.     esomeprazole (NEXIUM) 40 MG capsule Take 40 mg by mouth 2 (two) times daily.     magnesium oxide (MAG-OX) 400 MG tablet Take by mouth.     ondansetron (ZOFRAN) 8 MG tablet TAKE 1 TABLET BY MOUTH EVERY 8 HOURS AS NEEDED FOR NAUSEA AND VOMITING 45 tablet 1   oxyCODONE (OXY IR/ROXICODONE) 5 MG immediate release tablet Take 1 tablet (5 mg total) by mouth daily as needed for severe pain. Must last 30 days 30 tablet 0   [START ON 05/31/2022] oxyCODONE (OXY IR/ROXICODONE) 5 MG immediate release tablet Take 1 tablet (5 mg total) by mouth daily as needed for severe pain. Must last 30 days 30 tablet 0   [START ON 06/30/2022] oxyCODONE (OXY IR/ROXICODONE) 5 MG immediate release tablet Take 1 tablet (5 mg total) by mouth daily as needed for severe pain. Must last 30 days 30 tablet 0   sertraline (ZOLOFT) 100 MG tablet Take 100 mg by mouth daily.     zolpidem (AMBIEN) 10 MG tablet TAKE 1/2 TO 1 TABLET BY MOUTH EVERY NIGHT AT BEDTIME     No current facility-administered medications for this visit.      PHYSICAL EXAMINATION:   Vitals:   05/20/22 1300  BP: 110/66  Pulse: 64  Resp: 16  Temp: 98.5 F (36.9 C)   Filed Weights   05/20/22 1300  Weight: 140 lb 3.2 oz (63.6 kg)    Physical Exam HENT:     Head: Normocephalic and atraumatic.     Mouth/Throat:     Pharynx: No oropharyngeal exudate.  Eyes:     Pupils: Pupils are equal, round, and reactive to light.  Cardiovascular:     Rate and Rhythm: Normal rate and regular rhythm.  Pulmonary:     Effort: Pulmonary effort is normal. No respiratory distress.     Breath sounds: Normal breath sounds. No wheezing.  Abdominal:     General: Bowel sounds are normal. There is no distension.     Palpations: Abdomen is soft. There is no mass.      Tenderness: There is no abdominal tenderness. There is no guarding or rebound.  Musculoskeletal:        General: No tenderness. Normal range of motion.     Cervical back: Normal range of motion and neck supple.  Skin:    General: Skin is warm.  Neurological:     Mental Status: She is alert and oriented to person, place, and time.  Psychiatric:        Mood and Affect: Affect normal.     LABORATORY DATA:  I have reviewed the data as listed Lab Results  Component Value Date   WBC 7.2 05/20/2022   HGB 10.9 (L) 05/20/2022   HCT 36.1 05/20/2022   MCV 82.2 05/20/2022  PLT 353 05/20/2022   Recent Labs    04/23/22 1310 05/07/22 1256 05/20/22 1258  NA 138 135 134*  K 3.8 3.8 3.7  CL 106 107 106  CO2 23 25 22   GLUCOSE 148* 119* 99  BUN 13 16 17   CREATININE 1.12* 1.14* 1.05*  CALCIUM 8.9 7.9* 8.5*  GFRNONAA 48* 47* 51*     No results found.  Anemia due to stage 3b chronic kidney disease (National) # Anemia iron def/CKD stageIII : Hb-8-9 [MCV- 77] s/p IV iron infusion hemoglobin to 10.9.  Proceed with Venofer today  # Etiology: Unclear etiology.  GI versus CKD.  colo > 10 years ago; patient reluctant with GI procedures in the past.  Currently awaiting referral with GI- Dr.Wohl in October.   # CKDstage III-STABLE  # Constipation: ? Narcotics-defer to PCP/pain clinic- STABLE   # Chronic back pain/? [facility] [Dr.Neivera]- STABLE  pt [phone]-call  # DISPOSITION:  # venofer today # in 1 week Venofer  # follow up in last week of Oct 2023- MD; ; labs- cbc/bmp/iron studies/ferritin-- possible IVvenofer- Dr.B  Cc; Dr.Hedrick     All questions were answered. The patient knows to call the clinic with any problems, questions or concerns.      Cammie Sickle, MD 05/20/2022 2:14 PM

## 2022-05-27 ENCOUNTER — Inpatient Hospital Stay: Payer: PPO

## 2022-05-27 VITALS — BP 134/64 | HR 76 | Temp 98.6°F | Resp 17

## 2022-05-27 DIAGNOSIS — E611 Iron deficiency: Secondary | ICD-10-CM

## 2022-05-27 DIAGNOSIS — N1832 Chronic kidney disease, stage 3b: Secondary | ICD-10-CM | POA: Diagnosis not present

## 2022-05-27 MED ORDER — SODIUM CHLORIDE 0.9 % IV SOLN
200.0000 mg | Freq: Once | INTRAVENOUS | Status: AC
  Administered 2022-05-27: 200 mg via INTRAVENOUS
  Filled 2022-05-27: qty 200

## 2022-05-27 MED ORDER — SODIUM CHLORIDE 0.9 % IV SOLN
Freq: Once | INTRAVENOUS | Status: AC
  Filled 2022-05-27: qty 250

## 2022-05-27 NOTE — Patient Instructions (Signed)

## 2022-06-23 DIAGNOSIS — G4700 Insomnia, unspecified: Secondary | ICD-10-CM | POA: Diagnosis not present

## 2022-06-23 DIAGNOSIS — F1120 Opioid dependence, uncomplicated: Secondary | ICD-10-CM | POA: Diagnosis not present

## 2022-06-23 DIAGNOSIS — M797 Fibromyalgia: Secondary | ICD-10-CM | POA: Diagnosis not present

## 2022-06-23 DIAGNOSIS — R2689 Other abnormalities of gait and mobility: Secondary | ICD-10-CM | POA: Diagnosis not present

## 2022-07-28 NOTE — Progress Notes (Unsigned)
PROVIDER NOTE: Information contained herein reflects review and annotations entered in association with encounter. Interpretation of such information and data should be left to medically-trained personnel. Information provided to patient can be located elsewhere in the medical record under "Patient Instructions". Document created using STT-dictation technology, any transcriptional errors that may result from process are unintentional.    Patient: Cynthia Bennett  Service Category: E/M  Provider: Gaspar Cola, MD  DOB: Dec 28, 1932  DOS: 07/30/2022  Referring Provider: [name], MD  MRN: [id number]  Specialty: Interventional Pain Management  PCP: Cynthia Pink, MD  Type: Established Patient  Setting: Ambulatory outpatient    Location: Office  Delivery: Face-to-face     HPI  [name], a 88 y.o. year old female, is here today because of her No primary diagnosis found.. Cynthia Bennett primary complain today is No chief complaint on file. Last encounter: My last encounter with her was on 04/28/2022. Pertinent problems: Cynthia Bennett has Fibromyalgia; Chronic pain syndrome; Lumbar radicular pain (Bilateral) (R>L); Chronic low back pain (1ry area of Pain) (Bilateral) (L>R) w/o sciatica; Grade 1 spondylolisthesis of L4 over L5; Lumbar discogenic pain syndrome; Lumbar facet syndrome (Bilateral) (L>R); Lumbar facet hypertrophy (Bilateral); Chronic sacroiliac joint pain (Left); Chronic hip pain (Left); Lumbar spondylosis; Abnormal MRI, lumbar spine (06/25/2016); Cervical spondylosis (Anterolisthesis of C5 over C6); Chronic lower extremity pain (2ry area of Pain) (Left); Chronic lumbar radicular pain (Left) (L5 Dermatome); Osteoarthritis of hip (Left); Chronic sacroiliac joint pain (Bilateral) (R>L); Chronic shoulder pain (Left); Osteoarthritis of shoulder (Left); Chronic sacroiliac joint pain (Right); Chronic hip pain (Right); Left upper arm pain; Myofascial pain syndrome (left upper extremity); Acromioclavicular  joint pain (Left); Osteoarthritis of AC (acromioclavicular) joint (Left); Supraspinatus tendon tear, sequela (Left); Spondylosis without myelopathy or radiculopathy, lumbosacral region; Other specified dorsopathies, sacral and sacrococcygeal region; Bertolotti's syndrome (Left); DDD (degenerative disc disease), lumbar; Severe (5-6 mm) L4-5 lumbar spinal stenosis and (8 mm) L3-4 spinal stenosis.; Osteoarthritis of hip (Right); Trigger point with back pain (Right); Chronic myofascial pain; Chronic low back pain (Right) w/o sciatica; Chronic hip pain (3ry area of Pain) (Bilateral) (R>L); Osteoarthritis of hips (Bilateral); Chronic low back pain (Left) w/o sciatica; Thoracic back pain (Left); DDD (degenerative disc disease), thoracic; DDD (degenerative disc disease), thoracolumbar; Scoliosis of thoracolumbar region due to degenerative disease of spine in adult; Weakness of left leg; and Numbness and tingling of left leg on their pertinent problem list. Pain Assessment: Severity of   is reported as a  /10. Location:    / . Onset:  . Quality:  . Timing:  . Modifying factor(s):  Marland Kitchen Vitals:  vitals were not taken for this visit.   Reason for encounter: medication management. ***  RTCB:   Pharmacotherapy Assessment  Analgesic: Oxycodone IR 5 mg, 1 tab PO 3 times daily (maximum of 15 mg/day of oxycodone.) MME/day:  22.5 mg/day.   Monitoring: [place] PMP: PDMP reviewed during this encounter.       Pharmacotherapy: No side-effects or adverse reactions reported. Compliance: No problems identified. Effectiveness: Clinically acceptable.  No notes on file  No results found for: "CBDTHCR" No results found for: "D8THCCBX" No results found for: "D9THCCBX"  UDS:  Summary  Date Value Ref Range Status  04/28/2022 Note  Final    Comment:    ==================================================================== [employer] [facility] 13 (MW) ==================================================================== Test  Result       Flag       Units  Drug Present and Declared for Prescription Verification   Oxycodone                      305          EXPECTED   ng/mg creat   Noroxycodone                   2439         EXPECTED   ng/mg creat    Sources of oxycodone include scheduled prescription medications.    Noroxycodone is an expected metabolite of oxycodone.  ==================================================================== Test                      Result    Flag   Units      Ref Range   Creatinine              41               mg/dL      >=20 ==================================================================== Declared Medications:  The flagging and interpretation on this report are based on the  following declared medications.  Unexpected results may arise from  inaccuracies in the declared medications.   **Note: The testing scope of this panel includes these medications:   Oxycodone   **Note: The testing scope of this panel does not include the  following reported medications:   Acetaminophen (Tylenol)  Bupropion (Wellbutrin)  Dorzolamide  Doxepin  Esomeprazole  Magnesium (Mag-Ox)  Ondansetron (Zofran)  Sertraline (Zoloft)  Timolol  Vitamin D3  Zolpidem (Ambien) ==================================================================== For clinical consultation, please call [phone]. ====================================================================       ROS  Constitutional: Denies any fever or chills Gastrointestinal: No reported hemesis, hematochezia, vomiting, or acute GI distress Musculoskeletal: Denies any acute onset joint swelling, redness, loss of ROM, or weakness Neurological: No reported episodes of acute onset apraxia, aphasia, dysarthria, agnosia, amnesia, paralysis, loss of coordination, or loss of consciousness  Medication Review  Vitamin D3, acetaminophen, buPROPion, dorzolamide-timolol, doxepin, esomeprazole, magnesium oxide,  ondansetron, oxyCODONE, sertraline, and zolpidem  History Review  Allergy: Cynthia Bennett is allergic to ibuprofen. Drug: Cynthia Bennett  reports no history of drug use. Alcohol:  reports no history of alcohol use. Tobacco:  reports that she has quit smoking. She has never used smokeless tobacco. Social: Ms. Villa  reports that she has quit smoking. She has never used smokeless tobacco. She reports that she does not drink alcohol and does not use drugs. Medical:  has a past medical history of Dizzy spells, Fibromyalgia, Glaucoma, Hiatal hernia, Osteoarthritis, Osteoarthritis of spine with radiculopathy, lumbar region (08/14/2015), Peptic ulcer disease, Reflux, Spinal stenosis, Stroke ([place]), and TIA (transient ischemic attack). Surgical: Ms. Formby  has a past surgical history that includes Esophagogastroduodenoscopy (N/A, 04/16/2015); Appendectomy; and Abdominal hysterectomy. Family: family history includes COPD in her father; Mental illness in her mother.  Laboratory Chemistry Profile   Renal Lab Results  Component Value Date   BUN 17 05/20/2022   CREATININE 1.05 (H) 05/20/2022   BCR 11 (L) 01/10/2019   GFRAA 46 (L) 01/10/2019   GFRNONAA 51 (L) 05/20/2022    Hepatic Lab Results  Component Value Date   AST 18 01/10/2019   ALT 11 (L) 06/24/2016   ALBUMIN 4.3 01/10/2019   ALKPHOS 74 01/10/2019   LIPASE 139 04/27/2014    Electrolytes Lab Results  Component Value Date  NA 134 (L) 05/20/2022   K 3.7 05/20/2022   CL 106 05/20/2022   CALCIUM 8.5 (L) 05/20/2022   MG 2.3 01/10/2019    Bone Lab Results  Component Value Date   25OHVITD1 26 (L) 01/10/2019   25OHVITD2 1.6 01/10/2019   25OHVITD3 24 01/10/2019    Inflammation (CRP: Acute Phase) (ESR: Chronic Phase) Lab Results  Component Value Date   CRP 0.9 08/24/2020   ESRSEDRATE 51 (H) 01/10/2019         Note: Above Lab results reviewed.  Recent Imaging Review  DG PAIN CLINIC C-ARM 1-60 MIN NO REPORT Fluoro was used, but no Radiologist  interpretation will be provided.  Please refer to "NOTES" tab for provider progress note. Note: Reviewed        Physical Exam  General appearance: Well nourished, well developed, and well hydrated. In no apparent acute distress Mental status: Alert, oriented x 3 (person, place, & time)       Respiratory: No evidence of acute respiratory distress Eyes: PERLA Vitals: There were no vitals taken for this visit. BMI: Estimated body mass index is 25.64 kg/m as calculated from the following:   Height as of 04/28/22: 5' 2"  (1.575 m).   Weight as of 05/20/22: 140 lb 3.2 oz (63.6 kg). Ideal: Patient weight not recorded  Assessment   Diagnosis Status  No diagnosis found. Controlled Controlled Controlled   Updated Problems: No problems updated.  Plan of Care  Problem-specific:  No problem-specific Assessment & Plan notes found for this encounter.  Ms. TUESDAY TERLECKI has a current medication list which includes the following long-term medication(s): bupropion, doxepin, oxycodone, oxycodone, oxycodone, and zolpidem.  Pharmacotherapy (Medications Ordered): No orders of the defined types were placed in this encounter.  Orders:  No orders of the defined types were placed in this encounter.  Follow-up plan:   No follow-ups on file.     Interventional Therapies  Risk  Complexity Considerations:   Estimated body mass index is 24.8 kg/m as calculated from the following:   Height as of this encounter: 5' 3"  (1.6 m).   Weight as of this encounter: 140 lb (63.5 kg). Advanced age   Planned  Pending:      Under consideration:       Completed:   Diagnostic left L5-S1 pseudoarthrosis inj. #1 (01/09/2022) (10/10 to 0/10)  Therapeutic left L4-5 LESI x8 (06/25/2021) (100/100/80/90)  Therapeutic right L4-5 LESI x1 (07/21/2017) (DNR-F/U)  Palliative right L5-S1 LESI x1 (09/27/2015) (100/100/90)  Therapeutic left L5-S1 LESI x1 (02/05/2016) (100/100/100/100)  Diagnostic right SI joint injection  x2 (08/25/2019) (100/100/40/30)  Diagnostic left SI joint injection x2 (01/27/2019) (100/100/100/100)  Diagnostic left glenohumeral joint injection x1 (04/29/2017) (100/100/98/98)  Left biceps muscle trigger point injection x1 (07/21/2017) (DNR-F/U)  Right PSIS trigger point injection x1 (07/27/2018) (100/100/50/>50)  Diagnostic right lumbar facet MBB x3 (01/09/2022) (10/10 to 0/10)  Diagnostic left lumbar facet MBB x4 (01/09/2022) (10/10 to 0/10)  Diagnostic left IA hip injection x1 (01/27/2019) (100/100/100/100)    Therapeutic  Palliative (PRN) options:   None established     Recent Visits No visits were found meeting these conditions. Showing recent visits within past 90 days and meeting all other requirements Future Appointments Date Type Provider Dept  07/30/22 Appointment Milinda Pointer, MD [facility]  Showing future appointments within next 90 days and meeting all other requirements  I discussed the assessment and treatment plan with the patient. The patient was provided an opportunity to ask questions and all  were answered. The patient agreed with the plan and demonstrated an understanding of the instructions.  Patient advised to call back or seek an in-person evaluation if the symptoms or condition worsens.  Duration of encounter: *** minutes.  Total time on encounter, as per AMA guidelines included both the face-to-face and non-face-to-face time personally spent by the physician and/or other qualified health care professional(s) on the day of the encounter (includes time in activities that require the physician or other qualified health care professional and does not include time in activities normally performed by clinical staff). Physician's time may include the following activities when performed: preparing to see the patient (eg, review of tests, pre-charting review of records) obtaining and/or reviewing separately obtained history performing a medically  appropriate examination and/or evaluation counseling and educating the patient/family/caregiver ordering medications, tests, or procedures referring and communicating with other health care professionals (when not separately reported) documenting clinical information in the electronic or other health record independently interpreting results (not separately reported) and communicating results to the patient/ family/caregiver care coordination (not separately reported)  Note by: Cynthia Cola, MD Date: 07/30/2022; Time: 7:16 AM

## 2022-07-29 ENCOUNTER — Telehealth: Payer: Self-pay | Admitting: Internal Medicine

## 2022-07-29 NOTE — Telephone Encounter (Signed)
Pt daughter called and stated the patient was feeling weak and may need an iron infusion soon. She would like a call back at [phone] to discuss symptoms and scheduling infusion

## 2022-07-29 NOTE — Telephone Encounter (Signed)
I spoke with Judson Roch, [place]. Ok to have pt scheduled on Thursday with APP and labs. Patient will need to be evaluated first to determine if iron infusion is needed. If iv iron is needed, we can schedule the infusion at a later day. I contacted pt. apt sch. Thursday. At 215 for labs and 230 with Lauren, NP. Pt thanked me for calling her back.

## 2022-07-29 NOTE — Telephone Encounter (Signed)
Patient had bleed 8/2/233 in bowel movements. She had taken a new medicine on 20th , patient refused to see doctor. No longer having dark stools now. Just feels weak like she does when she needs iron. No stamina like when she needs iron infusion; denies dizziness or shortness of breath. Requesting to have her iron levels checked asap Her next appointment is not until 09/02/22

## 2022-07-30 ENCOUNTER — Encounter: Payer: Self-pay | Admitting: Pain Medicine

## 2022-07-30 ENCOUNTER — Ambulatory Visit: Payer: PPO | Attending: Pain Medicine | Admitting: Pain Medicine

## 2022-07-30 VITALS — BP 131/75 | HR 94 | Temp 97.2°F | Ht 63.0 in | Wt 138.0 lb

## 2022-07-30 DIAGNOSIS — G8929 Other chronic pain: Secondary | ICD-10-CM | POA: Diagnosis not present

## 2022-07-30 DIAGNOSIS — M5136 Other intervertebral disc degeneration, lumbar region: Secondary | ICD-10-CM | POA: Diagnosis not present

## 2022-07-30 DIAGNOSIS — M51369 Other intervertebral disc degeneration, lumbar region without mention of lumbar back pain or lower extremity pain: Secondary | ICD-10-CM

## 2022-07-30 DIAGNOSIS — M25552 Pain in left hip: Secondary | ICD-10-CM | POA: Insufficient documentation

## 2022-07-30 DIAGNOSIS — G894 Chronic pain syndrome: Secondary | ICD-10-CM | POA: Diagnosis not present

## 2022-07-30 DIAGNOSIS — M4155 Other secondary scoliosis, thoracolumbar region: Secondary | ICD-10-CM

## 2022-07-30 DIAGNOSIS — Z79899 Other long term (current) drug therapy: Secondary | ICD-10-CM | POA: Diagnosis not present

## 2022-07-30 DIAGNOSIS — Z79891 Long term (current) use of opiate analgesic: Secondary | ICD-10-CM | POA: Diagnosis not present

## 2022-07-30 DIAGNOSIS — M79605 Pain in left leg: Secondary | ICD-10-CM | POA: Insufficient documentation

## 2022-07-30 DIAGNOSIS — M5416 Radiculopathy, lumbar region: Secondary | ICD-10-CM | POA: Insufficient documentation

## 2022-07-30 DIAGNOSIS — M48062 Spinal stenosis, lumbar region with neurogenic claudication: Secondary | ICD-10-CM | POA: Diagnosis not present

## 2022-07-30 DIAGNOSIS — R937 Abnormal findings on diagnostic imaging of other parts of musculoskeletal system: Secondary | ICD-10-CM

## 2022-07-30 DIAGNOSIS — Q7649 Other congenital malformations of spine, not associated with scoliosis: Secondary | ICD-10-CM

## 2022-07-30 DIAGNOSIS — M4316 Spondylolisthesis, lumbar region: Secondary | ICD-10-CM

## 2022-07-30 DIAGNOSIS — M25551 Pain in right hip: Secondary | ICD-10-CM | POA: Diagnosis not present

## 2022-07-30 DIAGNOSIS — M5135 Other intervertebral disc degeneration, thoracolumbar region: Secondary | ICD-10-CM | POA: Diagnosis not present

## 2022-07-30 DIAGNOSIS — M545 Low back pain, unspecified: Secondary | ICD-10-CM | POA: Diagnosis not present

## 2022-07-30 DIAGNOSIS — M47816 Spondylosis without myelopathy or radiculopathy, lumbar region: Secondary | ICD-10-CM | POA: Diagnosis not present

## 2022-07-30 MED ORDER — OXYCODONE HCL 5 MG PO TABS: 5.0000 mg | ORAL_TABLET | Freq: Every day | ORAL | 0 refills | Status: DC | PRN

## 2022-07-30 NOTE — Progress Notes (Signed)
Nursing Pain Medication Assessment:  Safety precautions to be maintained throughout the outpatient stay will include: orient to surroundings, keep bed in low position, maintain call bell within reach at all times, provide assistance with transfer out of bed and ambulation.  Medication Inspection Compliance: Pill count conducted under aseptic conditions, in front of the patient. Neither the pills nor the bottle was removed from the patient's sight at any time. Once count was completed pills were immediately returned to the patient in their original bottle.  Medication: Oxycodone IR Pill/Patch Count:  4 of 30 pills remain Pill/Patch Appearance: Markings consistent with prescribed medication Bottle Appearance: Standard pharmacy container. Clearly labeled. Filled Date: 8 / 21 / 2023 Last Medication intake:  TodaySafety precautions to be maintained throughout the outpatient stay will include: orient to surroundings, keep bed in low position, maintain call bell within reach at all times, provide assistance with transfer out of bed and ambulation.

## 2022-07-31 ENCOUNTER — Inpatient Hospital Stay: Payer: PPO | Admitting: Nurse Practitioner

## 2022-07-31 ENCOUNTER — Other Ambulatory Visit: Payer: Self-pay

## 2022-07-31 ENCOUNTER — Inpatient Hospital Stay: Payer: PPO | Attending: Internal Medicine

## 2022-07-31 ENCOUNTER — Encounter: Payer: Self-pay | Admitting: Nurse Practitioner

## 2022-07-31 VITALS — BP 109/55 | HR 75 | Temp 99.1°F | Wt 136.8 lb

## 2022-07-31 DIAGNOSIS — N1831 Chronic kidney disease, stage 3a: Secondary | ICD-10-CM | POA: Diagnosis not present

## 2022-07-31 DIAGNOSIS — Z79899 Other long term (current) drug therapy: Secondary | ICD-10-CM | POA: Diagnosis not present

## 2022-07-31 DIAGNOSIS — Z836 Family history of other diseases of the respiratory system: Secondary | ICD-10-CM | POA: Insufficient documentation

## 2022-07-31 DIAGNOSIS — N183 Chronic kidney disease, stage 3 unspecified: Secondary | ICD-10-CM | POA: Diagnosis not present

## 2022-07-31 DIAGNOSIS — Z818 Family history of other mental and behavioral disorders: Secondary | ICD-10-CM | POA: Insufficient documentation

## 2022-07-31 DIAGNOSIS — M2550 Pain in unspecified joint: Secondary | ICD-10-CM | POA: Insufficient documentation

## 2022-07-31 DIAGNOSIS — Z87891 Personal history of nicotine dependence: Secondary | ICD-10-CM | POA: Insufficient documentation

## 2022-07-31 DIAGNOSIS — E611 Iron deficiency: Secondary | ICD-10-CM

## 2022-07-31 DIAGNOSIS — K5900 Constipation, unspecified: Secondary | ICD-10-CM | POA: Diagnosis not present

## 2022-07-31 DIAGNOSIS — D631 Anemia in chronic kidney disease: Secondary | ICD-10-CM

## 2022-07-31 DIAGNOSIS — R5383 Other fatigue: Secondary | ICD-10-CM | POA: Diagnosis not present

## 2022-07-31 DIAGNOSIS — M549 Dorsalgia, unspecified: Secondary | ICD-10-CM | POA: Diagnosis not present

## 2022-07-31 DIAGNOSIS — N1832 Chronic kidney disease, stage 3b: Secondary | ICD-10-CM

## 2022-07-31 DIAGNOSIS — G8929 Other chronic pain: Secondary | ICD-10-CM | POA: Diagnosis not present

## 2022-07-31 LAB — COMPREHENSIVE METABOLIC PANEL
ALT: 10 U/L (ref 0–44)
AST: 17 U/L (ref 15–41)
Albumin: 3.6 g/dL (ref 3.5–5.0)
Alkaline Phosphatase: 54 U/L (ref 38–126)
Anion gap: 4 — ABNORMAL LOW (ref 5–15)
BUN: 15 mg/dL (ref 8–23)
CO2: 24 mmol/L (ref 22–32)
Calcium: 8.6 mg/dL — ABNORMAL LOW (ref 8.9–10.3)
Chloride: 108 mmol/L (ref 98–111)
Creatinine, Ser: 1 mg/dL (ref 0.44–1.00)
GFR, Estimated: 55 mL/min — ABNORMAL LOW (ref >60)
Glucose, Bld: 85 mg/dL (ref 70–99)
Potassium: 3.7 mmol/L (ref 3.5–5.1)
Sodium: 136 mmol/L (ref 135–145)
Total Bilirubin: 0.3 mg/dL (ref 0.3–1.2)
Total Protein: 6.7 g/dL (ref 6.5–8.1)

## 2022-07-31 LAB — CBC WITH DIFFERENTIAL/PLATELET
Abs Immature Granulocytes: 0.02 10*3/uL (ref 0.00–0.07)
Basophils Absolute: 0.1 10*3/uL (ref 0.0–0.1)
Basophils Relative: 1 %
Eosinophils Absolute: 0.4 10*3/uL (ref 0.0–0.5)
Eosinophils Relative: 5 %
HCT: 31.2 % — ABNORMAL LOW (ref 36.0–46.0)
Hemoglobin: 9.8 g/dL — ABNORMAL LOW (ref 12.0–15.0)
Immature Granulocytes: 0 %
Lymphocytes Relative: 37 %
Lymphs Abs: 3 10*3/uL (ref 0.7–4.0)
MCH: 27.1 pg (ref 26.0–34.0)
MCHC: 31.4 g/dL (ref 30.0–36.0)
MCV: 86.2 fL (ref 80.0–100.0)
Monocytes Absolute: 0.7 10*3/uL (ref 0.1–1.0)
Monocytes Relative: 9 %
Neutro Abs: 4 10*3/uL (ref 1.7–7.7)
Neutrophils Relative %: 48 %
Platelets: 352 10*3/uL (ref 150–400)
RBC: 3.62 MIL/uL — ABNORMAL LOW (ref 3.87–5.11)
RDW: 15.7 % — ABNORMAL HIGH (ref 11.5–15.5)
WBC: 8.2 10*3/uL (ref 4.0–10.5)
nRBC: 0 % (ref 0.0–0.2)

## 2022-07-31 LAB — FERRITIN: Ferritin: 19 ng/mL (ref 11–307)

## 2022-07-31 LAB — IRON AND TIBC
Iron: 27 ug/dL — ABNORMAL LOW (ref 28–170)
Saturation Ratios: 8 % — ABNORMAL LOW (ref 10.4–31.8)
TIBC: 330 ug/dL (ref 250–450)
UIBC: 303 ug/dL

## 2022-07-31 NOTE — Progress Notes (Signed)
Patient here for oncology follow-up appointment, concerns of vision changes, dizzy spells and fatigue

## 2022-07-31 NOTE — Progress Notes (Signed)
[name] NOTE  Patient Care Team: [name], MD as PCP - General (Family Medicine) Cammie Sickle, MD as Consulting Physician (Hematology and Oncology)  CHIEF COMPLAINTS/PURPOSE OF CONSULTATION: Anemia   HEMATOLOGY HISTORY  # CHRONIC NORMOCYTIC ANEMIA [since 2019-PCP- Hb 8-9; Iron sat- 5%;NO Ferritin; GFR- 43]EGD/colonoscopy-?2013-2104- pt declines procedures; Intolerance to p.o. iron. IV Venofer-  #CKD stage III/chronic pain back-pain clinic narcotics.  HISTORY OF PRESENTING ILLNESS: In a wheelchair.  Accompanied by daughter.  Cynthia Bennett 88 y.o.  female history of anemia secondary to chronic kidney disease vs others is here for follow-up. She had episode of black stools in July which resolved. She is fatigued and feels weak. Last venofer 05/27/22.   Review of Systems  Constitutional:  Positive for malaise/fatigue. Negative for chills, fever and weight loss.  HENT:  Negative for hearing loss, nosebleeds, sore throat and tinnitus.   Eyes:  Negative for blurred vision and double vision.  Respiratory:  Negative for cough, hemoptysis, shortness of breath and wheezing.   Cardiovascular:  Negative for chest pain, palpitations and leg swelling.  Gastrointestinal:  Positive for melena (resolved). Negative for abdominal pain, blood in stool, constipation, diarrhea, nausea and vomiting.  Genitourinary:  Negative for dysuria and urgency.  Musculoskeletal:  Positive for back pain and joint pain. Negative for falls and myalgias.  Skin:  Negative for itching and rash.  Neurological:  Negative for dizziness, tingling, sensory change, loss of consciousness, weakness and headaches.  Endo/Heme/Allergies:  Negative for environmental allergies. Does not bruise/bleed easily.  Psychiatric/Behavioral:  Negative for depression. The patient is not nervous/anxious and does not have insomnia.     MEDICAL HISTORY:  Past Medical History:  Diagnosis Date   Dizzy spells     Fibromyalgia    Glaucoma    Hiatal hernia    Osteoarthritis    Osteoarthritis of spine with radiculopathy, lumbar region 08/14/2015   Peptic ulcer disease    Reflux    Spinal stenosis    Stroke ([place])    TIA (transient ischemic attack)     SURGICAL HISTORY: Past Surgical History:  Procedure Laterality Date   ABDOMINAL HYSTERECTOMY     APPENDECTOMY     ESOPHAGOGASTRODUODENOSCOPY N/A 04/16/2015   Procedure: ESOPHAGOGASTRODUODENOSCOPY (EGD);  Surgeon: Hulen Luster, MD;  Location: [facility] ENDOSCOPY;  Service: Gastroenterology;  Laterality: N/A;    SOCIAL HISTORY: Social History   Socioeconomic History   Marital status: Widowed    Spouse name: Not on file   Number of children: Not on file   Years of education: Not on file   Highest education level: Not on file  Occupational History   Not on file  Tobacco Use   Smoking status: Former   Smokeless tobacco: Never  Vaping Use   Vaping Use: Never used  Substance and Sexual Activity   Alcohol use: No    Alcohol/week: 0.0 standard drinks of alcohol   Drug use: No   Sexual activity: Not on file  Other Topics Concern   Not on file  Social History Narrative   Pt lives in [place]; with son. Quit smoking in 1957. No alcohol. Designed clothes/ alterations.    Social Determinants of Health   Financial Resource Strain: Not on file  Food Insecurity: Not on file  Transportation Needs: Not on file  Physical Activity: Not on file  Stress: Not on file  Social Connections: Not on file  Intimate Partner Violence: Not on file    FAMILY HISTORY: Family History  Problem Relation Age of Onset   Mental illness Mother    COPD Father     ALLERGIES:  is allergic to ibuprofen.  MEDICATIONS:  Current Outpatient Medications  Medication Sig Dispense Refill   acetaminophen (TYLENOL) 325 MG tablet Take 650 mg by mouth every 6 (six) hours as needed.     buPROPion (WELLBUTRIN SR) 150 MG 12 hr tablet TAKE 1 TABLET BY MOUTH ONCE DAILY      Cholecalciferol (VITAMIN D3) 20 MCG (800 UNIT) TABS Take by mouth 2 (two) times daily.     dorzolamide-timolol (COSOPT) 22.3-6.8 MG/ML ophthalmic solution Place 1 drop into both eyes in the morning and at bedtime.     doxepin (SINEQUAN) 100 MG capsule Take 100 mg by mouth at bedtime.     esomeprazole (NEXIUM) 40 MG capsule Take 40 mg by mouth 2 (two) times daily.     magnesium oxide (MAG-OX) 400 MG tablet Take by mouth.     ondansetron (ZOFRAN) 8 MG tablet TAKE 1 TABLET BY MOUTH EVERY 8 HOURS AS NEEDED FOR NAUSEA AND VOMITING 45 tablet 1   oxyCODONE (OXY IR/ROXICODONE) 5 MG immediate release tablet Take 1 tablet (5 mg total) by mouth daily as needed for severe pain. Must last 30 days 30 tablet 0   [START ON 08/29/2022] oxyCODONE (OXY IR/ROXICODONE) 5 MG immediate release tablet Take 1 tablet (5 mg total) by mouth daily as needed for severe pain. Must last 30 days 30 tablet 0   [START ON 09/28/2022] oxyCODONE (OXY IR/ROXICODONE) 5 MG immediate release tablet Take 1 tablet (5 mg total) by mouth daily as needed for severe pain. Must last 30 days 30 tablet 0   sertraline (ZOLOFT) 100 MG tablet Take 100 mg by mouth daily.     zolpidem (AMBIEN) 10 MG tablet TAKE 1/2 TO 1 TABLET BY MOUTH EVERY NIGHT AT BEDTIME     No current facility-administered medications for this visit.    PHYSICAL EXAMINATION: Vitals:   07/31/22 1456  BP: (!) 109/55  Pulse: 75  Temp: 99.1 F (37.3 C)  SpO2: 100%   Filed Weights   07/31/22 1456  Weight: 136 lb 12.8 oz (62.1 kg)    Physical Exam Constitutional:      Appearance: She is not ill-appearing.     Comments: Chronically ill appearing.   Eyes:     General: No scleral icterus.    Conjunctiva/sclera: Conjunctivae normal.  Cardiovascular:     Rate and Rhythm: Normal rate and regular rhythm.  Abdominal:     General: There is no distension.     Palpations: Abdomen is soft.     Tenderness: There is no abdominal tenderness. There is no guarding.   Musculoskeletal:        General: No deformity.     Right lower leg: No edema.     Left lower leg: No edema.  Lymphadenopathy:     Cervical: No cervical adenopathy.  Skin:    General: Skin is warm and dry.  Neurological:     Mental Status: She is alert and oriented to person, place, and time. Mental status is at baseline.  Psychiatric:        Mood and Affect: Mood normal.        Behavior: Behavior normal.    LABORATORY DATA:  I have reviewed the data as listed Lab Results  Component Value Date   WBC 7.2 05/20/2022   HGB 10.9 (L) 05/20/2022   HCT 36.1 05/20/2022   MCV 82.2 05/20/2022  PLT 353 05/20/2022   Recent Labs    04/23/22 1310 05/07/22 1256 05/20/22 1258  NA 138 135 134*  K 3.8 3.8 3.7  CL 106 107 106  CO2 23 25 22   GLUCOSE 148* 119* 99  BUN 13 16 17   CREATININE 1.12* 1.14* 1.05*  CALCIUM 8.9 7.9* 8.5*  GFRNONAA 48* 47* 51*   Iron/TIBC/Ferritin/ %Sat    Component Value Date/Time   IRON 49 05/20/2022 1258   TIBC 323 05/20/2022 1258   FERRITIN 117 05/20/2022 1258   IRONPCTSAT 15 05/20/2022 1258   No results found.  No problem-specific Assessment & Plan notes found for this encounter.  Anemia due to stage 3b chronic kidney disease ([place]) # Anemia iron def/CKD stage III : Hb-8-9 [MCV- 77] s/p IV iron infusion hemoglobin improved to 11. Today, worse at 9.8. Ferritin decreased to 19, iron sat 8%. Proceed with venofer x 5, weekly.    # Etiology: Unclear etiology. GI versus CKD.  colo > 10 years ago; patient reluctant with GI procedures in the past.  Currently awaiting referral with GI- Dr.Wohl in October.    # CKD stage III-[facility].    # Constipation: ? Narcotics- defer to PCP/pain clinic- [facility]    # Chronic back pain/? [facility] [Dr.Neivera]- [facility]   pt [phone]-call   # DISPOSITION:  Venofer x 5 (weekly) 3 mo- lab (cbc, cmp, ferritin, iron studies), Dr. Rogue Bussing, +/- venofer- la  All questions were answered. The patient knows to call the  clinic with any problems, questions or concerns.  Verlon Au, NP 07/31/2022

## 2022-08-04 ENCOUNTER — Inpatient Hospital Stay: Payer: PPO

## 2022-08-04 VITALS — BP 107/52 | HR 34 | Temp 97.4°F | Resp 18

## 2022-08-04 DIAGNOSIS — E611 Iron deficiency: Secondary | ICD-10-CM

## 2022-08-04 DIAGNOSIS — N183 Chronic kidney disease, stage 3 unspecified: Secondary | ICD-10-CM | POA: Diagnosis not present

## 2022-08-04 MED ORDER — SODIUM CHLORIDE 0.9 % IV SOLN
Freq: Once | INTRAVENOUS | Status: AC
  Filled 2022-08-04: qty 250

## 2022-08-04 MED ORDER — SODIUM CHLORIDE 0.9 % IV SOLN
200.0000 mg | Freq: Once | INTRAVENOUS | Status: AC
  Administered 2022-08-04: 200 mg via INTRAVENOUS
  Filled 2022-08-04: qty 200

## 2022-08-05 ENCOUNTER — Encounter: Payer: Self-pay | Admitting: Internal Medicine

## 2022-08-11 ENCOUNTER — Ambulatory Visit
Admission: RE | Admit: 2022-08-11 | Discharge: 2022-08-11 | Disposition: A | Discharge status: Home / Self Care | Payer: PPO | Source: Ambulatory Visit | Attending: Pain Medicine | Admitting: Pain Medicine

## 2022-08-11 DIAGNOSIS — M545 Low back pain, unspecified: Secondary | ICD-10-CM | POA: Insufficient documentation

## 2022-08-11 DIAGNOSIS — M25552 Pain in left hip: Secondary | ICD-10-CM | POA: Diagnosis not present

## 2022-08-11 DIAGNOSIS — M51369 Other intervertebral disc degeneration, lumbar region without mention of lumbar back pain or lower extremity pain: Secondary | ICD-10-CM

## 2022-08-11 DIAGNOSIS — M5416 Radiculopathy, lumbar region: Secondary | ICD-10-CM | POA: Diagnosis not present

## 2022-08-11 DIAGNOSIS — M5136 Other intervertebral disc degeneration, lumbar region: Secondary | ICD-10-CM | POA: Diagnosis not present

## 2022-08-11 DIAGNOSIS — M47816 Spondylosis without myelopathy or radiculopathy, lumbar region: Secondary | ICD-10-CM | POA: Diagnosis not present

## 2022-08-11 DIAGNOSIS — Q7649 Other congenital malformations of spine, not associated with scoliosis: Secondary | ICD-10-CM

## 2022-08-11 DIAGNOSIS — G8929 Other chronic pain: Secondary | ICD-10-CM | POA: Diagnosis not present

## 2022-08-11 DIAGNOSIS — M5135 Other intervertebral disc degeneration, thoracolumbar region: Secondary | ICD-10-CM

## 2022-08-11 DIAGNOSIS — M4155 Other secondary scoliosis, thoracolumbar region: Secondary | ICD-10-CM | POA: Diagnosis not present

## 2022-08-11 DIAGNOSIS — M4316 Spondylolisthesis, lumbar region: Secondary | ICD-10-CM | POA: Diagnosis not present

## 2022-08-11 DIAGNOSIS — M25551 Pain in right hip: Secondary | ICD-10-CM | POA: Insufficient documentation

## 2022-08-11 DIAGNOSIS — R937 Abnormal findings on diagnostic imaging of other parts of musculoskeletal system: Secondary | ICD-10-CM

## 2022-08-11 DIAGNOSIS — M48062 Spinal stenosis, lumbar region with neurogenic claudication: Secondary | ICD-10-CM | POA: Diagnosis not present

## 2022-08-11 DIAGNOSIS — M79605 Pain in left leg: Secondary | ICD-10-CM | POA: Insufficient documentation

## 2022-08-12 ENCOUNTER — Inpatient Hospital Stay: Payer: PPO | Attending: Internal Medicine

## 2022-08-12 VITALS — BP 146/60 | HR 61 | Temp 98.7°F | Resp 16

## 2022-08-12 DIAGNOSIS — E611 Iron deficiency: Secondary | ICD-10-CM | POA: Diagnosis not present

## 2022-08-12 DIAGNOSIS — N1832 Chronic kidney disease, stage 3b: Secondary | ICD-10-CM | POA: Insufficient documentation

## 2022-08-12 DIAGNOSIS — Z79899 Other long term (current) drug therapy: Secondary | ICD-10-CM | POA: Insufficient documentation

## 2022-08-12 DIAGNOSIS — D631 Anemia in chronic kidney disease: Secondary | ICD-10-CM | POA: Diagnosis not present

## 2022-08-12 MED ORDER — SODIUM CHLORIDE 0.9 % IV SOLN
Freq: Once | INTRAVENOUS | Status: AC
  Filled 2022-08-12: qty 250

## 2022-08-12 MED ORDER — SODIUM CHLORIDE 0.9 % IV SOLN
200.0000 mg | Freq: Once | INTRAVENOUS | Status: AC
  Administered 2022-08-12: 200 mg via INTRAVENOUS
  Filled 2022-08-12: qty 200

## 2022-08-12 NOTE — Patient Instructions (Signed)

## 2022-08-18 MED FILL — Iron Sucrose Inj 20 MG/ML (Fe Equiv): INTRAVENOUS | Qty: 10 | Status: AC

## 2022-08-19 ENCOUNTER — Inpatient Hospital Stay: Payer: PPO

## 2022-08-19 VITALS — BP 116/66 | HR 72 | Temp 98.3°F

## 2022-08-19 DIAGNOSIS — E611 Iron deficiency: Secondary | ICD-10-CM

## 2022-08-19 DIAGNOSIS — N1832 Chronic kidney disease, stage 3b: Secondary | ICD-10-CM | POA: Diagnosis not present

## 2022-08-19 MED ORDER — SODIUM CHLORIDE 0.9 % IV SOLN
200.0000 mg | Freq: Once | INTRAVENOUS | Status: AC
  Administered 2022-08-19: 200 mg via INTRAVENOUS
  Filled 2022-08-19: qty 200

## 2022-08-19 MED ORDER — SODIUM CHLORIDE 0.9 % IV SOLN
Freq: Once | INTRAVENOUS | Status: AC
  Filled 2022-08-19: qty 250

## 2022-08-19 NOTE — Patient Instructions (Signed)

## 2022-08-25 MED FILL — Iron Sucrose Inj 20 MG/ML (Fe Equiv): INTRAVENOUS | Qty: 10 | Status: AC

## 2022-08-26 ENCOUNTER — Inpatient Hospital Stay: Payer: PPO

## 2022-08-26 VITALS — BP 149/66 | HR 79 | Temp 96.5°F | Resp 16

## 2022-08-26 DIAGNOSIS — N1832 Chronic kidney disease, stage 3b: Secondary | ICD-10-CM | POA: Diagnosis not present

## 2022-08-26 DIAGNOSIS — E611 Iron deficiency: Secondary | ICD-10-CM

## 2022-08-26 MED ORDER — SODIUM CHLORIDE 0.9 % IV SOLN
200.0000 mg | Freq: Once | INTRAVENOUS | Status: AC
  Administered 2022-08-26: 200 mg via INTRAVENOUS
  Filled 2022-08-26: qty 200

## 2022-08-26 MED ORDER — SODIUM CHLORIDE 0.9 % IV SOLN
Freq: Once | INTRAVENOUS | Status: AC
  Filled 2022-08-26: qty 250

## 2022-08-26 NOTE — Patient Instructions (Signed)
[facility]  Discharge Instructions: Thank you for choosing [facility] to provide your oncology and hematology care.  If you have a lab appointment with the Cancer Center, please go directly to the Cancer Center and check in at the registration area.  Wear comfortable clothing and clothing appropriate for easy access to any Portacath or PICC line.   We strive to give you quality time with your provider. You may need to reschedule your appointment if you arrive late (15 or more minutes).  Arriving late affects you and other patients whose appointments are after yours.  Also, if you miss three or more appointments without notifying the office, you may be dismissed from the clinic at the provider's discretion.      For prescription refill requests, have your pharmacy contact our office and allow 72 hours for refills to be completed.    Today you received the following chemotherapy and/or immunotherapy agents Venofer.      To help prevent nausea and vomiting after your treatment, we encourage you to take your nausea medication as directed.  BELOW ARE SYMPTOMS THAT SHOULD BE REPORTED IMMEDIATELY: *FEVER GREATER THAN 100.4 F (38 C) OR HIGHER *CHILLS OR SWEATING *NAUSEA AND VOMITING THAT IS NOT CONTROLLED WITH YOUR NAUSEA MEDICATION *UNUSUAL SHORTNESS OF BREATH *UNUSUAL BRUISING OR BLEEDING *URINARY PROBLEMS (pain or burning when urinating, or frequent urination) *BOWEL PROBLEMS (unusual diarrhea, constipation, pain near the anus) TENDERNESS IN MOUTH AND THROAT WITH OR WITHOUT PRESENCE OF ULCERS (sore throat, sores in mouth, or a toothache) UNUSUAL RASH, SWELLING OR PAIN  UNUSUAL VAGINAL DISCHARGE OR ITCHING   Items with * indicate a potential emergency and should be followed up as soon as possible or go to the Emergency Department if any problems should occur.  Please show the CHEMOTHERAPY ALERT CARD or IMMUNOTHERAPY ALERT CARD at check-in to  the Emergency Department and triage nurse.  Should you have questions after your visit or need to cancel or reschedule your appointment, please contact [facility]  [phone] and follow the prompts.  Office hours are 8:00 a.m. to 4:30 p.m. Monday - Friday. Please note that voicemails left after 4:00 p.m. may not be returned until the following business day.  We are closed weekends and major holidays. You have access to a nurse at all times for urgent questions. Please call the main number to the clinic [phone] and follow the prompts.  For any non-urgent questions, you may also contact your provider using MyChart. We now offer e-Visits for anyone 18 and older to request care online for non-urgent symptoms. For details visit [email or web address].   Also download the MyChart app! Go to the app store, search "MyChart", open the app, select [facility], and log in with your MyChart username and password.  Masks are optional in the cancer centers. If you would like for your care team to wear a mask while they are taking care of you, please let them know. For doctor visits, patients may have with them one support person who is at least 86 years old. At this time, visitors are not allowed in the infusion area.   

## 2022-09-01 ENCOUNTER — Ambulatory Visit: Payer: PPO | Admitting: Gastroenterology

## 2022-09-02 ENCOUNTER — Ambulatory Visit: Payer: PPO

## 2022-09-02 ENCOUNTER — Inpatient Hospital Stay: Payer: PPO

## 2022-09-02 ENCOUNTER — Other Ambulatory Visit: Payer: PPO

## 2022-09-02 ENCOUNTER — Ambulatory Visit: Payer: PPO | Admitting: Internal Medicine

## 2022-09-02 VITALS — BP 128/72 | HR 82 | Temp 97.1°F | Resp 18

## 2022-09-02 DIAGNOSIS — E611 Iron deficiency: Secondary | ICD-10-CM

## 2022-09-02 DIAGNOSIS — N1832 Chronic kidney disease, stage 3b: Secondary | ICD-10-CM | POA: Diagnosis not present

## 2022-09-02 MED ORDER — SODIUM CHLORIDE 0.9 % IV SOLN
200.0000 mg | Freq: Once | INTRAVENOUS | Status: AC
  Administered 2022-09-02: 200 mg via INTRAVENOUS
  Filled 2022-09-02: qty 200

## 2022-09-02 MED ORDER — SODIUM CHLORIDE 0.9 % IV SOLN
Freq: Once | INTRAVENOUS | Status: AC
  Filled 2022-09-02: qty 250

## 2022-09-02 NOTE — Patient Instructions (Signed)

## 2022-09-23 DIAGNOSIS — R42 Dizziness and giddiness: Secondary | ICD-10-CM | POA: Diagnosis not present

## 2022-09-23 DIAGNOSIS — E538 Deficiency of other specified B group vitamins: Secondary | ICD-10-CM | POA: Diagnosis not present

## 2022-09-23 DIAGNOSIS — D509 Iron deficiency anemia, unspecified: Secondary | ICD-10-CM | POA: Diagnosis not present

## 2022-09-23 DIAGNOSIS — G4700 Insomnia, unspecified: Secondary | ICD-10-CM | POA: Diagnosis not present

## 2022-09-23 DIAGNOSIS — M797 Fibromyalgia: Secondary | ICD-10-CM | POA: Diagnosis not present

## 2022-10-20 ENCOUNTER — Encounter: Payer: PPO | Admitting: Pain Medicine

## 2022-10-20 DIAGNOSIS — H401133 Primary open-angle glaucoma, bilateral, severe stage: Secondary | ICD-10-CM | POA: Diagnosis not present

## 2022-10-22 ENCOUNTER — Encounter: Payer: Self-pay | Admitting: Pain Medicine

## 2022-10-22 ENCOUNTER — Ambulatory Visit: Payer: PPO | Attending: Pain Medicine | Admitting: Pain Medicine

## 2022-10-22 VITALS — BP 107/92 | HR 67 | Temp 97.1°F | Resp 16 | Ht 63.0 in | Wt 140.0 lb

## 2022-10-22 DIAGNOSIS — M4316 Spondylolisthesis, lumbar region: Secondary | ICD-10-CM | POA: Insufficient documentation

## 2022-10-22 DIAGNOSIS — Z79891 Long term (current) use of opiate analgesic: Secondary | ICD-10-CM | POA: Insufficient documentation

## 2022-10-22 DIAGNOSIS — G8929 Other chronic pain: Secondary | ICD-10-CM | POA: Insufficient documentation

## 2022-10-22 DIAGNOSIS — M5135 Other intervertebral disc degeneration, thoracolumbar region: Secondary | ICD-10-CM | POA: Diagnosis not present

## 2022-10-22 DIAGNOSIS — M25552 Pain in left hip: Secondary | ICD-10-CM | POA: Diagnosis not present

## 2022-10-22 DIAGNOSIS — M47817 Spondylosis without myelopathy or radiculopathy, lumbosacral region: Secondary | ICD-10-CM | POA: Insufficient documentation

## 2022-10-22 DIAGNOSIS — M79605 Pain in left leg: Secondary | ICD-10-CM | POA: Insufficient documentation

## 2022-10-22 DIAGNOSIS — M48062 Spinal stenosis, lumbar region with neurogenic claudication: Secondary | ICD-10-CM | POA: Diagnosis not present

## 2022-10-22 DIAGNOSIS — M47816 Spondylosis without myelopathy or radiculopathy, lumbar region: Secondary | ICD-10-CM | POA: Diagnosis not present

## 2022-10-22 DIAGNOSIS — G894 Chronic pain syndrome: Secondary | ICD-10-CM | POA: Insufficient documentation

## 2022-10-22 DIAGNOSIS — M5136 Other intervertebral disc degeneration, lumbar region: Secondary | ICD-10-CM | POA: Diagnosis not present

## 2022-10-22 DIAGNOSIS — M4155 Other secondary scoliosis, thoracolumbar region: Secondary | ICD-10-CM | POA: Insufficient documentation

## 2022-10-22 DIAGNOSIS — M5416 Radiculopathy, lumbar region: Secondary | ICD-10-CM | POA: Diagnosis not present

## 2022-10-22 DIAGNOSIS — Q7649 Other congenital malformations of spine, not associated with scoliosis: Secondary | ICD-10-CM | POA: Diagnosis not present

## 2022-10-22 DIAGNOSIS — Z79899 Other long term (current) drug therapy: Secondary | ICD-10-CM | POA: Insufficient documentation

## 2022-10-22 DIAGNOSIS — M25551 Pain in right hip: Secondary | ICD-10-CM | POA: Insufficient documentation

## 2022-10-22 DIAGNOSIS — M545 Low back pain, unspecified: Secondary | ICD-10-CM | POA: Insufficient documentation

## 2022-10-22 MED ORDER — OXYCODONE HCL 5 MG PO TABS: 5.0000 mg | ORAL_TABLET | Freq: Every day | ORAL | 0 refills | Status: DC | PRN

## 2022-10-22 MED ORDER — NALOXONE HCL 4 MG/0.1ML NA LIQD: 1.0000 | NASAL | 0 refills | Status: DC | PRN

## 2022-10-22 NOTE — Patient Instructions (Signed)
____________________________________________________________________________________________  Patient Information update  To: All of our patients.  Re: Name change.  It has been made official that our current name, "[facility]"   will soon be changed to "[facility]".   The purpose of this change is to eliminate any confusion created by the concept of our practice being a "Medication Management Pain Clinic". In the past this has led to the misconception that we treat pain primarily by the use of prescription medications.  Nothing can be farther from the truth.   Understanding PAIN MANAGEMENT: To further understand what our practice does, you first have to understand that "Pain Management" is a subspecialty that requires additional training once a physician has completed their specialty training, which can be in either Anesthesia, Neurology, Psychiatry, or Physical Medicine and Rehabilitation (PMR). Each one of these contributes to the final approach taken by each physician to the management of their patient's pain. To be a "Pain Management Specialist" you must have first completed one of the specialty trainings below.  Anesthesiologists - trained in clinical pharmacology and interventional techniques such as nerve blockade and regional as well as central neuroanatomy. They are trained to block pain before, during, and after surgical interventions.  Neurologists - trained in the diagnosis and pharmacological treatment of complex neurological conditions, such as Multiple Sclerosis, Parkinson's, spinal cord injuries, and other systemic conditions that may be associated with symptoms that may include but are not limited to pain. They tend to rely primarily on the treatment of chronic pain using prescription medications.  Psychiatrist - trained in conditions affecting the psychosocial  wellbeing of patients including but not limited to depression, anxiety, schizophrenia, personality disorders, addiction, and other substance use disorders that may be associated with chronic pain. They tend to rely primarily on the treatment of chronic pain using prescription medications.   Physical Medicine and Rehabilitation (PMR) physicians, also known as physiatrists - trained to treat a wide variety of medical conditions affecting the brain, spinal cord, nerves, bones, joints, ligaments, muscles, and tendons. Their training is primarily aimed at treating patients that have suffered injuries that have caused severe physical impairment. Their training is primarily aimed at the physical therapy and rehabilitation of those patients. They may also work alongside orthopedic surgeons or neurosurgeons using their expertise in assisting surgical patients to recover after their surgeries.  INTERVENTIONAL PAIN MANAGEMENT is sub-subspecialty of Pain Management.  Our physicians are Board-certified in Anesthesia, Pain Management, and Interventional Pain Management.  This meaning that not only have they been trained and Board-certified in their specialty of Anesthesia, and subspecialty of Pain Management, but they have also received further training in the sub-subspecialty of Interventional Pain Management, in order to become Board-certified as INTERVENTIONAL PAIN MANAGEMENT SPECIALIST.    Mission: Our goal is to use our skills in  INTERVENTIONAL PAIN MANAGEMENT as alternatives to the chronic use of prescription opioid medications for the treatment of pain. To make this more clear, we have changed our name to reflect what we do and offer. We will continue to offer medication management assessment and recommendations, but we will not be taking over any patient's medication management.  ____________________________________________________________________________________________      ____________________________________________________________________________________________  National Pain Medication Shortage  The [place] is experiencing worsening drug shortages. These have had a negative widespread effect on patient care and treatment. Not expected to improve any time soon. Predicted to last past 2029.   Drug shortage list (generic   names) Oxycodone IR Oxycodone/APAP Oxymorphone IR Hydromorphone Hydrocodone/APAP Morphine  Where is the problem?  Manufacturing and supply level.  Will this shortage affect you?  Only if you take any of the above pain medications.  How? You may be unable to fill your prescription.  Your pharmacist may offer a "partial fill" of your prescription. (Warning: Do not accept partial fills.) Prescriptions partially filled cannot be transferred to another pharmacy. Read our Medication Rules and Regulation. Depending on how much medicine you are dependent on, you may experience withdrawals when unable to get the medication.  Recommendations: Consider ending your dependence on opioid pain medications. Ask your pain specialist to assist you with the process. Consider switching to a medication currently not in shortage, such as Buprenorphine. Talk to your pain specialist about this option. Consider decreasing your pain medication requirements by managing tolerance thru "Drug Holidays". This may help minimize withdrawals, should you run out of medicine. Control your pain thru the use of non-pharmacological interventional therapies.   Your prescriber: Prescribers cannot be blamed for shortages. Medication manufacturing and supply issues cannot be fixed by the prescriber.   NOTE: The prescriber is not responsible for supplying the medication, or solving supply issues. Work with your pharmacist to solve it. The patient is responsible for the decision to take or continue taking the medication and for identifying and securing a legal supply source. By  law, supplying the medication is the job and responsibility of the pharmacy. The prescriber is responsible for the evaluation, monitoring, and prescribing of these medications.   Prescribers will NOT: Re-issue prescriptions that have been partially filled. Re-issue prescriptions already sent to a pharmacy.  Re-send prescriptions to a different pharmacy because yours did not have your medication. Ask pharmacist to order more medicine or transfer the prescription to another pharmacy. (Read below.)  New 2023 regulation: "July 11, 2022 Revised Regulation Allows DEA-Registered Pharmacies to Transfer Electronic Prescriptions at a Patient's Request DEA Headquarters Division - Public Information Office Patients now have the ability to request their electronic prescription be transferred to another pharmacy without having to go back to their practitioner to initiate the request. This revised regulation went into effect on Monday, July 07, 2022.     At a patient's request, a DEA-registered retail pharmacy can now transfer an electronic prescription for a controlled substance (schedules II-V) to another DEA-registered retail pharmacy. Prior to this change, patients would have to go through their practitioner to cancel their prescription and have it re-issued to a different pharmacy. The process was taxing and time consuming for both patients and practitioners.    The Drug Enforcement Administration (DEA) published its intent to revise the process for transferring electronic prescriptions on September 28, 2020.  The final rule was published in the federal register on June 05, 2022 and went into effect 30 days later.  Under the final rule, a prescription can only be transferred once between pharmacies, and only if allowed under existing state or other applicable law. The prescription must remain in its electronic form; may not be altered in any way; and the transfer must be communicated directly between  two licensed pharmacists. It's important to note, any authorized refills transfer with the original prescription, which means the entire prescription will be filled at the same pharmacy".  Reference: https://www.dea.gov/stories/2023/2023-07/2022-09-01/revised-regulation-allows-dea-registered-pharmacies-transfer (DEA website announcement)  https://www.govinfo.gov/content/pkg/FR-2022-06-05/pdf/2023-15847.pdf (Federal Register  Department of Justice)   Federal Register / Vol. 88, No. 143 / Thursday, June 05, 2022 / Rules and Regulations DEPARTMENT OF JUSTICE  Drug Enforcement   Administration  21 CFR Part 1306  [Docket No. [id number]]  RIN 1117-AB64 Transfer of Electronic Prescriptions for Schedules II-V Controlled Substances Between Pharmacies for Initial Filling  ____________________________________________________________________________________________     ____________________________________________________________________________________________  Drug Holidays  What is a "Drug Holiday"? Drug Holiday: is the name given to the process of slowly tapering down and temporarily stopping the pain medication for the purpose of decreasing or eliminating tolerance to the drug.  Benefits Improved effectiveness Decreased required effective dose Improved pain control End dependence on high dose therapy Decrease cost of therapy Uncovering "opioid-induced hyperalgesia". (OIH)  What is "opioid hyperalgesia"? It is a paradoxical increase in pain caused by exposure to opioids. Stopping the opioid pain medication, contrary to the expected, it actually decreases or completely eliminates the pain. Ref.: "A comprehensive review of opioid-induced hyperalgesia". Marion Lee, et.al. Pain Physician. 2011 Mar-Apr;14(2):145-61.  What is tolerance? Tolerance: the progressive loss of effectiveness of a pain medicine due to repetitive use. A common problem of opioid pain medications.  How long should a "Drug  Holiday" last? Effectiveness depends on the patient staying off all opioid pain medicines for a minimum of 14 consecutive days. (2 weeks)  How about just taking less of the medicine? Does not work. Will not accomplish goal of eliminating the excess receptors.  How about switching to a different pain medicine? (AKA. "Opioid rotation") Does not work. Creates the illusion of effectiveness by taking advantage of inaccurate equivalent dose calculations between different opioids. -This "technique" was promoted by studies funded by pharmaceutical companies, such as [employer], creators of "OxyContin".  Can I stop the medicine "cold turkey"? Depends. You should always coordinate with your Pain Specialist to make the transition as smoothly as possible. Avoid stopping the medicine abruptly without consulting. We recommend a "slow taper".  What is a slow taper? Taper: refers to the gradual decrease in dose.   How do I stop/taper the dose? Slowly. Decrease the daily amount of pills that you take by one (1) pill every seven (7) days. This is called a "slow downward taper". Example: if you normally take four (4) pills per day, drop it to three (3) pills per day for seven (7) days, then to two (2) pills per day for seven (7) days, then to one (1) per day for seven (7) days, and then stop the medicine. The 14 day "Drug Holiday" starts on the first day without medicine.   Will I experience withdrawals? Unlikely with a slow taper.  What triggers withdrawals? Withdrawals are triggered by the sudden/abrupt stop of high dose opioids. Withdrawals can be avoided by slowly decreasing the dose over a prolonged period of time.  What are withdrawals? Symptoms associated with sudden/abrupt reduction/stopping of high-dose, long-term use of pain medication. Withdrawal are seldom seen on low dose therapy, or patients rarely taking opioid medication.  Early Withdrawal Symptoms may include: Agitation Anxiety Muscle  aches Increased tearing Insomnia Runny nose Sweating Yawning  Late symptoms may include: Abdominal cramping Diarrhea Dilated pupils Goose bumps Nausea Vomiting  (Last update: 10/19/2022) ____________________________________________________________________________________________    _______________________________________________________________________  Medication Rules  Purpose: To inform patients, and their family members, of our medication rules and regulations.  Applies to: All patients receiving prescriptions from our practice (written or electronic).  Pharmacy of record: This is the pharmacy where your electronic prescriptions will be sent. Make sure we have the correct one.  Electronic prescriptions: In compliance with the [place] Strengthen Opioid Misuse Prevention (STOP) Act of 2017 (Session Law 2017-74/H243), effective November 10, 2018, all controlled substances must be electronically prescribed. Written prescriptions, faxing, or calling prescriptions to a pharmacy will no longer be done.  Prescription refills: These will be provided only during in-person appointments. No medications will be renewed without a "face-to-face" evaluation with your provider. Applies to all prescriptions.  NOTE: The following applies primarily to controlled substances (Opioid* Pain Medications).   Type of encounter (visit): For patients receiving controlled substances, face-to-face visits are required. (Not an option and not up to the patient.)  Patient's responsibilities: Pain Pills: Bring all pain pills to every appointment (except for procedure appointments). Pill Bottles: Bring pills in original pharmacy bottle. Bring bottle, even if empty. Always bring the bottle of the most recent fill.  Medication refills: You are responsible for knowing and keeping track of what medications you are taking and when is it that you will need a refill. The day before your appointment: write a  list of all prescriptions that need to be refilled. The day of the appointment: give the list to the admitting nurse. Prescriptions will be written only during appointments. No prescriptions will be written on procedure days. If you forget a medication: it will not be "Called in", "Faxed", or "electronically sent". You will need to get another appointment to get these prescribed. No early refills. Do not call asking to have your prescription filled early. Partial  or short prescriptions: Occasionally your pharmacy may not have enough pills to fill your prescription.  NEVER ACCEPT a partial fill or a prescription that is short of the total amount of pills that you were prescribed.  With controlled substances the law allows 72 hours for the pharmacy to complete the prescription.  If the prescription is not completed within 72 hours, the pharmacist will require a new prescription to be written. This means that you will be short on your medicine and we WILL NOT send another prescription to complete your original prescription.  Instead, request the pharmacy to send a carrier to a nearby branch to get enough medication to provide you with your full prescription. Prescription Accuracy: You are responsible for carefully inspecting your prescriptions before leaving our office. Have the discharge nurse carefully go over each prescription with you, before taking them home. Make sure that your name is accurately spelled, that your address is correct. Check the name and dose of your medication to make sure it is accurate. Check the number of pills, and the written instructions to make sure they are clear and accurate. Make sure that you are given enough medication to last until your next medication refill appointment. Taking Medication: Take medication as prescribed. When it comes to controlled substances, taking less pills or less frequently than prescribed is permitted and encouraged. Never take more pills than  instructed. Never take the medication more frequently than prescribed.  Inform other Doctors: Always inform, all of your healthcare providers, of all the medications you take. Pain Medication from other Providers: You are not allowed to accept any additional pain medication from any other Doctor or Healthcare provider. There are two exceptions to this rule. (see below) In the event that you require additional pain medication, you are responsible for notifying us, as stated below. Cough Medicine: Often these contain an opioid, such as codeine or hydrocodone. Never accept or take cough medicine containing these opioids if you are already taking an opioid* medication. The combination may cause respiratory failure and death. Medication Agreement: You are responsible for carefully reading and following our Medication Agreement. This   must be signed before receiving any prescriptions from our practice. Safely store a copy of your signed Agreement. Violations to the Agreement will result in no further prescriptions. (Additional copies of our Medication Agreement are available upon request.) Laws, Rules, & Regulations: All patients are expected to follow all Federal and State Laws, Statutes, Rules, & Regulations. Ignorance of the Laws does not constitute a valid excuse.  Illegal drugs and Controlled Substances: The use of illegal substances (including, but not limited to marijuana and its derivatives) and/or the illegal use of any controlled substances is strictly prohibited. Violation of this rule may result in the immediate and permanent discontinuation of any and all prescriptions being written by our practice. The use of any illegal substances is prohibited. Adopted CDC guidelines & recommendations: Target dosing levels will be at or below 60 MME/day. Use of benzodiazepines** is not recommended.  Exceptions: There are only two exceptions to the rule of not receiving pain medications from other Healthcare  Providers. Exception #1 (Emergencies): In the event of an emergency (i.e.: accident requiring emergency care), you are allowed to receive additional pain medication. However, you are responsible for: As soon as you are able, call our office [phone], at any time of the day or night, and leave a message stating your name, the date and nature of the emergency, and the name and dose of the medication prescribed. In the event that your call is answered by a member of our staff, make sure to document and save the date, time, and the name of the person that took your information.  Exception #2 (Planned Surgery): In the event that you are scheduled by another doctor or dentist to have any type of surgery or procedure, you are allowed (for a period no longer than 30 days), to receive additional pain medication, for the acute post-op pain. However, in this case, you are responsible for picking up a copy of our "Post-op Pain Management for Surgeons" handout, and giving it to your surgeon or dentist. This document is available at our office, and does not require an appointment to obtain it. Simply go to our office during business hours (Monday-Thursday from 8:00 AM to 4:00 PM) (Friday 8:00 AM to 12:00 Noon) or if you have a scheduled appointment with us, prior to your surgery, and ask for it by name. In addition, you are responsible for: calling our office [phone], at any time of the day or night, and leaving a message stating your name, name of your surgeon, type of surgery, and date of procedure or surgery. Failure to comply with your responsibilities may result in termination of therapy involving the controlled substances. Medication Agreement Violation. Following the above rules, including your responsibilities will help you in avoiding a Medication Agreement Violation ("Breaking your Pain Medication Contract").  Consequences:  Not following the above rules may result in permanent discontinuation of  medication prescription therapy.  *Opioid medications include: morphine, codeine, oxycodone, oxymorphone, hydrocodone, hydromorphone, meperidine, tramadol, tapentadol, buprenorphine, fentanyl, methadone. **Benzodiazepine medications include: diazepam (Valium), alprazolam (Xanax), clonazepam (Klonopine), lorazepam (Ativan), clorazepate (Tranxene), chlordiazepoxide (Librium), estazolam (Prosom), oxazepam (Serax), temazepam (Restoril), triazolam (Halcion) (Last updated: 09/02/2022) ______________________________________________________________________    ______________________________________________________________________  Medication Recommendations and Reminders  Applies to: All patients receiving prescriptions (written and/or electronic).  Medication Rules & Regulations: You are responsible for reading, knowing, and following our "Medication Rules" document. These exist for your safety and that of others. They are not flexible and neither are we. Dismissing or ignoring them is an   act of "non-compliance" that may result in complete and irreversible termination of such medication therapy. For safety reasons, "non-compliance" will not be tolerated. As with the [place] fundamental legal principle of "ignorance of the law is no defense", we will accept no excuses for not having read and knowing the content of documents provided to you by our practice.  Pharmacy of record:  Definition: This is the pharmacy where your electronic prescriptions will be sent.  We do not endorse any particular pharmacy. It is up to you and your insurance to decide what pharmacy to use.  We do not restrict you in your choice of pharmacy. However, once we write for your prescriptions, we will NOT be re-sending more prescriptions to fix restricted supply problems created by your pharmacy, or your insurance.  The pharmacy listed in the electronic medical record should be the one where you want electronic prescriptions to be  sent. If you choose to change pharmacy, simply notify our nursing staff. Changes will be made only during your regular appointments and not over the phone.  Recommendations: Keep all of your pain medications in a safe place, under lock and key, even if you live alone. We will NOT replace lost, stolen, or damaged medication. We do not accept "Police Reports" as proof of medications having been stolen. After you fill your prescription, take 1 week's worth of pills and put them away in a safe place. You should keep a separate, properly labeled bottle for this purpose. The remainder should be kept in the original bottle. Use this as your primary supply, until it runs out. Once it's gone, then you know that you have 1 week's worth of medicine, and it is time to come in for a prescription refill. If you do this correctly, it is unlikely that you will ever run out of medicine. To make sure that the above recommendation works, it is very important that you make sure your medication refill appointments are scheduled at least 1 week before you run out of medicine. To do this in an effective manner, make sure that you do not leave the office without scheduling your next medication management appointment. Always ask the nursing staff to show you in your prescription , when your medication will be running out. Then arrange for the receptionist to get you a return appointment, at least 7 days before you run out of medicine. Do not wait until you have 1 or 2 pills left, to come in. This is very poor planning and does not take into consideration that we may need to cancel appointments due to bad weather, sickness, or emergencies affecting our staff. DO NOT ACCEPT A "Partial Fill": If for any reason your pharmacy does not have enough pills/tablets to completely fill or refill your prescription, do not allow for a "partial fill". The law allows the pharmacy to complete that prescription within 72 hours, without requiring a new  prescription. If they do not fill the rest of your prescription within those 72 hours, you will need a separate prescription to fill the remaining amount, which we will NOT provide. If the reason for the partial fill is your insurance, you will need to talk to the pharmacist about payment alternatives for the remaining tablets, but again, DO NOT ACCEPT A PARTIAL FILL, unless you can trust your pharmacist to obtain the remainder of the pills within 72 hours.  Prescription refills and/or changes in medication(s):  Prescription refills, and/or changes in dose or medication, will be conducted only   during scheduled medication management appointments. (Applies to both, written and electronic prescriptions.) No refills on procedure days. No medication will be changed or started on procedure days. No changes, adjustments, and/or refills will be conducted on a procedure day. Doing so will interfere with the diagnostic portion of the procedure. No phone refills. No medications will be "called into the pharmacy". No Fax refills. No weekend refills. No Holliday refills. No after hours refills.  Remember:  Business hours are:  Monday to Thursday 8:00 AM to 4:00 PM Provider's Schedule: Kahlie Deutscher, MD - Appointments are:  Medication management: Monday and Wednesday 8:00 AM to 4:00 PM Procedure day: Tuesday and Thursday 7:30 AM to 4:00 PM Bilal Lateef, MD - Appointments are:  Medication management: Tuesday and Thursday 8:00 AM to 4:00 PM Procedure day: Monday and Wednesday 7:30 AM to 4:00 PM (Last update: 09/02/2022) ______________________________________________________________________    ____________________________________________________________________________________________  WARNING: CBD (cannabidiol) & Delta (Delta-8 tetrahydrocannabinol) products.   Applicable to:  All individuals currently taking or considering taking CBD (cannabidiol) and, more important, all patients taking opioid  analgesic controlled substances (pain medication). (Example: oxycodone; oxymorphone; hydrocodone; hydromorphone; morphine; methadone; tramadol; tapentadol; fentanyl; buprenorphine; butorphanol; dextromethorphan; meperidine; codeine; etc.)  Introduction:  Recently there has been a drive towards the use of "natural" products for the treatment of different conditions, including pain anxiety and sleep disorders. Marijuana and hemp are two varieties of the cannabis genus plants. Marijuana and its derivatives are illegal, while hemp and its derivatives are not. Cannabidiol (CBD) and tetrahydrocannabinol (THC), are two natural compounds found in plants of the Cannabis genus. They can both be extracted from hemp or marijuana. Both compounds interact with your body's endocannabinoid system in very different ways. CBD is associated with pain relief (analgesia) while THC is associated with the psychoactive effects ("the high") obtained from the use of marijuana products. There are two main types of THC: Delta-9, which comes from the marijuana plant and it is illegal, and Delta-8, which comes from the hemp plant, and it is legal. (Both, Delta-9-THC and Delta-8-THC are psychoactive and give you "the high".)   Legality:  Marijuana and its derivatives: illegal Hemp and its derivatives: Legal (State dependent) UPDATE: (12/27/2021) The Drug Enforcement Agency (DEA) issued a letter stating that "delta" cannabinoids, including Delta-8-THCO and Delta-9-THCO, synthetically derived from hemp do not qualify as hemp and will be viewed as Schedule I drugs. (Schedule I drugs, substances, or chemicals are defined as drugs with no currently accepted medical use and a high potential for abuse. Some examples of Schedule I drugs are: heroin, lysergic acid diethylamide (LSD), marijuana (cannabis), 3,4-methylenedioxymethamphetamine (ecstasy), methaqualone, and peyote.) (https://www.dea.gov)  Legal status of CBD in [place]:  "Conditionally  Legal"  Reference: "FDA Regulation of Cannabis and Cannabis-Derived Products, Including Cannabidiol (CBD)" - https://www.fda.gov/news-events/public-health-focus/fda-regulation-cannabis-and-cannabis-derived-products-including-cannabidiol-cbd  Warning:  CBD is not FDA approved and has not undergo the same manufacturing controls as prescription drugs.  This means that the purity and safety of available CBD may be questionable. Most of the time, despite manufacturer's claims, it is contaminated with THC (delta-9-tetrahydrocannabinol - the chemical in marijuana responsible for the "HIGH").  When this is the case, the THC contaminant will trigger a positive urine drug screen (UDS) test for Marijuana (carboxy-THC).   The FDA recently put out a warning about 5 things that everyone should be aware of regarding Delta-8 THC: Delta-8 THC products have not been evaluated or approved by the FDA for safe use and may be marketed in ways that put the public health at   risk. The FDA has received adverse event reports involving delta-8 THC-containing products. Delta-8 THC has psychoactive and intoxicating effects. Delta-8 THC manufacturing often involve use of potentially harmful chemicals to create the concentrations of delta-8 THC claimed in the marketplace. The final delta-8 THC product may have potentially harmful by-products (contaminants) due to the chemicals used in the process. Manufacturing of delta-8 THC products may occur in uncontrolled or unsanitary settings, which may lead to the presence of unsafe contaminants or other potentially harmful substances. Delta-8 THC products should be kept out of the reach of children and pets.  NOTE: Because a positive UDS for any illicit substance is a violation of our medication agreement, your opioid analgesics (pain medicine) may be permanently discontinued.  MORE ABOUT CBD  General Information: CBD was discovered in 1940 and it is a derivative of the cannabis sativa  genus plants (Marijuana and Hemp). It is one of the 113 identified substances found in Marijuana. It accounts for up to 40% of the plant's extract. As of 2018, preliminary clinical studies on CBD included research for the treatment of anxiety, movement disorders, and pain. CBD is available and consumed in multiple forms, including inhalation of smoke or vapor, as an aerosol spray, and by mouth. It may be supplied as an oil containing CBD, capsules, dried cannabis, or as a liquid solution. CBD is thought not to be as psychoactive as THC (delta-9-tetrahydrocannabinol - the chemical in marijuana responsible for the "HIGH"). Studies suggest that CBD may interact with different biological target receptors in the body, including cannabinoid and other neurotransmitter receptors. As of 2018 the mechanism of action for its biological effects has not been determined.  Side-effects  Adverse reactions: Dry mouth, diarrhea, decreased appetite, fatigue, drowsiness, malaise, weakness, sleep disturbances, and others.  Drug interactions:  CBD may interact with medications such as blood-thinners. CBD causes drowsiness on its own and it will increase drowsiness caused by other medications, including antihistamines (such as Benadryl), benzodiazepines (Xanax, Ativan, Valium), antipsychotics, antidepressants, opioids, alcohol and supplements such as kava, melatonin and St. John's Wort.  Other drug interactions: Brivaracetam (Briviact); Caffeine; Carbamazepine (Tegretol); Citalopram (Celexa); Clobazam (Onfi); Eslicarbazepine (Aptiom); Everolimus (Zostress); Lithium; Methadone (Dolophine); Rufinamide (Banzel); Sedative medications (CNS depressants); Sirolimus (Rapamune); Stiripentol (Diacomit); Tacrolimus (Prograf); Tamoxifen ; Soltamox); Topiramate (Topamax); Valproate; Warfarin (Coumadin); Zonisamide. (Last update: 10/20/2022) ____________________________________________________________________________________________    ____________________________________________________________________________________________  Naloxone Nasal Spray  Why am I receiving this medication? [place] STOP ACT requires that all patients taking high dose opioids or at risk of opioids respiratory depression, be prescribed an opioid reversal agent, such as Naloxone (AKA: Narcan).  What is this medication? NALOXONE (nal OX one) treats opioid overdose, which causes slow or shallow breathing, severe drowsiness, or trouble staying awake. Call emergency services after using this medication. You may need additional treatment. Naloxone works by reversing the effects of opioids. It belongs to a group of medications called opioid blockers.  COMMON BRAND NAME(S): Kloxxado, Narcan  What should I tell my care team before I take this medication? They need to know if you have any of these conditions: Heart disease Substance use disorder An unusual or allergic reaction to naloxone, other medications, foods, dyes, or preservatives Pregnant or trying to get pregnant Breast-feeding  When to use this medication? This medication is to be used for the treatment of respiratory depression (less than 8 breaths per minute) secondary to opioid overdose.   How to use this medication? This medication is for use in the nose. Lay the person on their   back. Support their neck with your hand and allow the head to tilt back before giving the medication. The nasal spray should be given into 1 nostril. After giving the medication, move the person onto their side. Do not remove or test the nasal spray until ready to use. Get emergency medical help right away after giving the first dose of this medication, even if the person wakes up. You should be familiar with how to recognize the signs and symptoms of a narcotic overdose. If more doses are needed, give the additional dose in the other nostril. Talk to your care team about the use of this medication in children.  While this medication may be prescribed for children as young as newborns for selected conditions, precautions do apply.  Naloxone Overdosage: If you think you have taken too much of this medicine contact a poison control center or emergency room at once.  NOTE: This medicine is only for you. Do not share this medicine with others.  What if I miss a dose? This does not apply.  What may interact with this medication? This is only used during an emergency. No interactions are expected during emergency use. This list may not describe all possible interactions. Give your health care provider a list of all the medicines, herbs, non-prescription drugs, or dietary supplements you use. Also tell them if you smoke, drink alcohol, or use illegal drugs. Some items may interact with your medicine.  What should I watch for while using this medication? Keep this medication ready for use in the case of an opioid overdose. Make sure that you have the phone number of your care team and local hospital ready. You may need to have additional doses of this medication. Each nasal spray contains a single dose. Some emergencies may require additional doses. After use, bring the treated person to the nearest hospital or call 911. Make sure the treating care team knows that the person has received a dose of this medication. You will receive additional instructions on what to do during and after use of this medication before an emergency occurs.  What side effects may I notice from receiving this medication? Side effects that you should report to your care team as soon as possible: Allergic reactions--skin rash, itching, hives, swelling of the face, lips, tongue, or throat Side effects that usually do not require medical attention (report these to your care team if they continue or are bothersome): Constipation Dryness or irritation inside the nose Headache Increase in blood pressure Muscle spasms Stuffy  nose Toothache This list may not describe all possible side effects. Call your doctor for medical advice about side effects. You may report side effects to FDA at 1-800-FDA-1088.  Where should I keep my medication? Because this is an emergency medication, you should keep it with you at all times.  Keep out of the reach of children and pets. Store between 20 and 25 degrees C (68 and 77 degrees F). Do not freeze. Throw away any unused medication after the expiration date. Keep in original box until ready to use.  NOTE: This sheet is a summary. It may not cover all possible information. If you have questions about this medicine, talk to your doctor, pharmacist, or health care provider.   2023 Elsevier/Gold Standard (2021-07-05 00:00:00)  ____________________________________________________________________________________________   

## 2022-10-22 NOTE — Progress Notes (Signed)
Nursing Pain Medication Assessment:  Safety precautions to be maintained throughout the outpatient stay will include: orient to surroundings, keep bed in low position, maintain call bell within reach at all times, provide assistance with transfer out of bed and ambulation.  Medication Inspection Compliance: Pill count conducted under aseptic conditions, in front of the patient. Neither the pills nor the bottle was removed from the patient's sight at any time. Once count was completed pills were immediately returned to the patient in their original bottle.  Medication: Oxycodone IR Pill/Patch Count:  12 of 30 pills remain Pill/Patch Appearance: Markings consistent with prescribed medication Bottle Appearance: Standard pharmacy container. Clearly labeled. Filled Date: 33 / 20 / 2023 Last Medication intake:  Yesterday

## 2022-10-22 NOTE — Progress Notes (Signed)
PROVIDER NOTE: Information contained herein reflects review and annotations entered in association with encounter. Interpretation of such information and data should be left to medically-trained personnel. Information provided to patient can be located elsewhere in the medical record under "Patient Instructions". Document created using STT-dictation technology, any transcriptional errors that may result from process are unintentional.    Patient: Cynthia Bennett  Service Category: E/M  Provider: Gaspar Cola, MD  DOB: 09-19-1933  DOS: 10/22/2022  Referring Provider: [name], MD  MRN: [id number]  Specialty: Interventional Pain Management  PCP: [name], MD  Type: Established Patient  Setting: Ambulatory outpatient    Location: Office  Delivery: Face-to-face     HPI  [name], a 88 y.o. year old female, is here today because of her Chronic bilateral low back pain without sciatica [M54.50, G89.29]. Ms. Shampine primary complain today is Back Pain Last encounter: My last encounter with her was on 07/30/2022. Pertinent problems: Ms. Hofstra has Fibromyalgia; Chronic pain syndrome; Lumbar radicular pain (Bilateral) (R>L); Chronic low back pain (1ry area of Pain) (Bilateral) (L>R) w/o sciatica; Grade 1 spondylolisthesis of L4 over L5; Lumbar discogenic pain syndrome; Lumbar facet syndrome (Bilateral) (L>R); Lumbar facet hypertrophy (Bilateral); Chronic sacroiliac joint pain (Left); Chronic hip pain (Left); Lumbar spondylosis; Abnormal MRI, lumbar spine (06/25/2016); Cervical spondylosis (Anterolisthesis of C5 over C6); Chronic lower extremity pain (2ry area of Pain) (Left); Chronic lumbar radicular pain (Left) (L5 Dermatome); Osteoarthritis of hip (Left); Chronic sacroiliac joint pain (Bilateral) (R>L); Chronic shoulder pain (Left); Osteoarthritis of shoulder (Left); Chronic sacroiliac joint pain (Right); Chronic hip pain (Right); Left upper arm pain; Myofascial pain syndrome (left upper  extremity); Acromioclavicular joint pain (Left); Osteoarthritis of AC (acromioclavicular) joint (Left); Supraspinatus tendon tear, sequela (Left); Spondylosis without myelopathy or radiculopathy, lumbosacral region; Other specified dorsopathies, sacral and sacrococcygeal region; Bertolotti's syndrome (Left); DDD (degenerative disc disease), lumbar; Severe (5-6 mm) L4-5 lumbar spinal stenosis and (8 mm) L3-4 spinal stenosis.; Osteoarthritis of hip (Right); Trigger point with back pain (Right); Chronic myofascial pain; Chronic low back pain (Right) w/o sciatica; Chronic hip pain (3ry area of Pain) (Bilateral) (R>L); Osteoarthritis of hips (Bilateral); Chronic low back pain (Left) w/o sciatica; Thoracic back pain (Left); DDD (degenerative disc disease), thoracic; DDD (degenerative disc disease), thoracolumbar; Scoliosis of thoracolumbar region due to degenerative disease of spine in adult; Weakness of left leg; and Numbness and tingling of left leg on their pertinent problem list. Pain Assessment: Severity of Chronic pain is reported as a 0-No pain/10. Location: Back Lower/around to groin on right sideand down inside of legeffects top of foot and great toe. Onset: More than a month ago. Quality: Aching, Discomfort, Constant, Radiating. Timing: Constant. Modifying factor(s): medication, rest, prop foot up. Vitals:  height is _0  (1.6 m) and weight is 140 lb (63.5 kg). Her temperature is 97.1 F (36.2 C) (abnormal). Her blood pressure is 107/92 (abnormal) and her pulse is 67. Her respiration is 16 and oxygen saturation is 100%.  BMI: Estimated body mass index is 24.8 kg/m as calculated from the following:   Height as of this encounter: _1  (1.6 m).   Weight as of this encounter: 140 lb (63.5 kg).  Reason for encounter: medication management.  The patient indicates doing well with the current medication regimen. No adverse reactions or side effects reported to the medications.   Today we went over the  results of her lumbar MRI and I have explained the findings to the patient in layman's terms.  She refers taking a little bit more medicine than prescribed.  The patient was reminded that if she runs out of medicine early we will not be refilling it.  She understood and accepted.  This also triggered a conversation regarding drug holidays.  I took the opportunity to explain the concept of tolerance and how "Drug Holidays" help control it.  I detailed how to do a slow taper to avoid withdrawals.   The patient indicated having more pain in the left lower back.  She denies any lower extremity pain, numbness, or weakness.  Even though the MRI does suggest that she has some spinal stenosis, lateral recess stenosis, foraminal stenosis, she presents with absolutely no signs or symptoms of radiculitis or radiculopathy.  The pain is more localized to the lower back and it is likely to be associated with her documented lumbar facet arthropathy.  Today I will be entering an order to do a left-sided lumbar facet block.  The patient was informed of the planned which she understood and accepted.  The plan is to see if this is where the pain is coming from and if it is, consider radiofrequency ablation in order to get longer lasting benefit.  RTCB: 01/26/2023   Pharmacotherapy Assessment  Analgesic: Oxycodone IR 5 mg, 1 tab PO daily (maximum of 5 mg/day of oxycodone.) MME/day: 7.5 mg/day.   Monitoring: [place] PMP: PDMP reviewed during this encounter.       Pharmacotherapy: No side-effects or adverse reactions reported. Compliance: No problems identified. Effectiveness: Clinically acceptable.  Ignatius Specking, RN  10/22/2022  8:28 AM  Sign when Signing Visit Nursing Pain Medication Assessment:  Safety precautions to be maintained throughout the outpatient stay will include: orient to surroundings, keep bed in low position, maintain call bell within reach at all times, provide assistance with transfer out of bed  and ambulation.  Medication Inspection Compliance: Pill count conducted under aseptic conditions, in front of the patient. Neither the pills nor the bottle was removed from the patient's sight at any time. Once count was completed pills were immediately returned to the patient in their original bottle.  Medication: Oxycodone IR Pill/Patch Count:  12 of 30 pills remain Pill/Patch Appearance: Markings consistent with prescribed medication Bottle Appearance: Standard pharmacy container. Clearly labeled. Filled Date: 64 / 20 / 2023 Last Medication intake:  Yesterday    No results found for: "CBDTHCR" No results found for: "D8THCCBX" No results found for: "D9THCCBX"  UDS:  Summary  Date Value Ref Range Status  04/28/2022 Note  Final    Comment:    ==================================================================== [facility] (MW) ==================================================================== Test                             Result       Flag       Units  Drug Present and Declared for Prescription Verification   Oxycodone                      305          EXPECTED   ng/mg creat   Noroxycodone                   2439         EXPECTED   ng/mg creat    Sources of oxycodone include scheduled prescription medications.    Noroxycodone is an expected metabolite of oxycodone.  ==================================================================== Test  Result    Flag   Units      Ref Range   Creatinine              41               mg/dL      >=20 ==================================================================== Declared Medications:  The flagging and interpretation on this report are based on the  following declared medications.  Unexpected results may arise from  inaccuracies in the declared medications.   **Note: The testing scope of this panel includes these medications:   Oxycodone   **Note: The testing scope of this panel does not include the   following reported medications:   Acetaminophen (Tylenol)  Bupropion (Wellbutrin)  Dorzolamide  Doxepin  Esomeprazole  Magnesium (Mag-Ox)  Ondansetron (Zofran)  Sertraline (Zoloft)  Timolol  Vitamin D3  Zolpidem (Ambien) ==================================================================== For clinical consultation, please call [phone]. ====================================================================       ROS  Constitutional: Denies any fever or chills Gastrointestinal: No reported hemesis, hematochezia, vomiting, or acute GI distress Musculoskeletal: Denies any acute onset joint swelling, redness, loss of ROM, or weakness Neurological: No reported episodes of acute onset apraxia, aphasia, dysarthria, agnosia, amnesia, paralysis, loss of coordination, or loss of consciousness  Medication Review  Vitamin D3, acetaminophen, buPROPion, dorzolamide-timolol, doxepin, esomeprazole, magnesium oxide, naloxone, ondansetron, oxyCODONE, sertraline, and zolpidem  History Review  Allergy: Ms. Cerveny is allergic to ibuprofen. Drug: Ms. Zarazua  reports no history of drug use. Alcohol:  reports no history of alcohol use. Tobacco:  reports that she has quit smoking. She has never used smokeless tobacco. Social: Ms. Slaugh  reports that she has quit smoking. She has never used smokeless tobacco. She reports that she does not drink alcohol and does not use drugs. Medical:  has a past medical history of Dizzy spells, Fibromyalgia, Glaucoma, Hiatal hernia, Osteoarthritis, Osteoarthritis of spine with radiculopathy, lumbar region (08/14/2015), Peptic ulcer disease, Reflux, Spinal stenosis, Stroke ([place]), and TIA (transient ischemic attack). Surgical: Ms. Demary  has a past surgical history that includes Esophagogastroduodenoscopy (N/A, 04/16/2015); Appendectomy; and Abdominal hysterectomy. Family: family history includes COPD in her father; Mental illness in her mother.  Laboratory Chemistry Profile    Renal Lab Results  Component Value Date   BUN 15 07/31/2022   CREATININE 1.00 07/31/2022   BCR 11 (L) 01/10/2019   GFRAA 46 (L) 01/10/2019   GFRNONAA 55 (L) 07/31/2022    Hepatic Lab Results  Component Value Date   AST 17 07/31/2022   ALT 10 07/31/2022   ALBUMIN 3.6 07/31/2022   ALKPHOS 54 07/31/2022   LIPASE 139 04/27/2014    Electrolytes Lab Results  Component Value Date   NA 136 07/31/2022   K 3.7 07/31/2022   CL 108 07/31/2022   CALCIUM 8.6 (L) 07/31/2022   MG 2.3 01/10/2019    Bone Lab Results  Component Value Date   25OHVITD1 26 (L) 01/10/2019   25OHVITD2 1.6 01/10/2019   25OHVITD3 24 01/10/2019    Inflammation (CRP: Acute Phase) (ESR: Chronic Phase) Lab Results  Component Value Date   CRP 0.9 08/24/2020   ESRSEDRATE 51 (H) 01/10/2019         Note: Above Lab results reviewed.  Recent Imaging Review  MR LUMBAR SPINE WO CONTRAST CLINICAL DATA:  Low back pain with left-sided radiculopathy  EXAM: MRI LUMBAR SPINE WITHOUT CONTRAST  TECHNIQUE: Multiplanar, multisequence MR imaging of the lumbar spine was performed. No intravenous contrast was administered.  COMPARISON:  06/25/2016  FINDINGS: Segmentation:  In keeping with numbering scheme utilized on the previous MRI report from 06/25/2016, the lowest well developed disc space is designated as L5-S1.  Alignment: Lumbar levocurvature. Grade 1 anterolisthesis of L4 on L5.  Vertebrae: No fracture, evidence of discitis, or bone lesion. Discogenic endplate marrow changes most pronounced at T10-11.  Conus medullaris and cauda equina: Conus extends to the L1 level. Conus and cauda equina appear normal.  Paraspinal and other soft tissues: Right kidney ectopically located in the pelvis. Enlarged liver versus elongated right hepatic lobe (Riedel's lobe), incompletely imaged.  Disc levels:  T10-T11: Partially imaged on sagittal sequences. Disc height loss with mild disc bulge and endplate spurring.  There appears to be mild-to-moderate canal stenosis with high-grade left foraminal stenosis. Right neural foramen was not included in the field of view.  T11-T12: Sagittal sequences only. No significant canal stenosis. Mild-moderate left foraminal stenosis.  T12-L1: Mild disc bulge with right foraminal protrusion. Mild right foraminal stenosis. No canal stenosis.  L1-L2: Mild diffuse disc bulge with shallow right paracentral disc protrusion. Mild right facet joint arthropathy. Mild right foraminal stenosis. No significant canal stenosis.  L2-L3: Disc height loss with mild disc bulge and endplate ridging, eccentric to the right. Right greater than left facet arthropathy with ligamentum flavum buckling. Mild right foraminal stenosis. No canal stenosis.  L3-L4: Mild central disc protrusion, slightly cranially extending. Moderate bilateral facet arthropathy with ligamentum flavum buckling. Mild right subarticular recess stenosis. No canal or foraminal stenosis.  L4-L5: Anterolisthesis with disc uncovering and central disc protrusion. Moderate bilateral facet arthropathy. Moderate-severe canal stenosis with bilateral subarticular recess stenosis. Mild-moderate bilateral foraminal stenosis. Findings have slightly progressed from prior.  L5-S1: Shallow disc bulge. Mild facet hypertrophy. No significant foraminal stenosis. No canal stenosis.  IMPRESSION: Multilevel degenerative changes of the lumbar spine as described above. Findings are slightly progressed at the L4-5 level where there is moderate-to-severe canal stenosis with bilateral subarticular recess stenosis and mild-to-moderate bilateral foraminal stenosis.  Electronically Signed   By: Davina Poke D.O.   On: 08/13/2022 17:32 Note: Reviewed        Physical Exam  General appearance: Well nourished, well developed, and well hydrated. In no apparent acute distress Mental status: Alert, oriented x 3 (person, place, &  time)       Respiratory: No evidence of acute respiratory distress Eyes: PERLA Vitals: BP (!) 107/92   Pulse 67   Temp (!) 97.1 F (36.2 C)   Resp 16   Ht _0  (1.6 m)   Wt 140 lb (63.5 kg)   SpO2 100%   BMI 24.80 kg/m  BMI: Estimated body mass index is 24.8 kg/m as calculated from the following:   Height as of this encounter: _1  (1.6 m).   Weight as of this encounter: 140 lb (63.5 kg). Ideal: Ideal body weight: 52.4 kg (115 lb 8.3 oz) Adjusted ideal body weight: 56.8 kg (125 lb 5 oz)  Assessment   Diagnosis Status  1. Chronic low back pain (1ry area of Pain) (Bilateral) (L>R) w/o sciatica   2. Lumbar facet syndrome (Bilateral) (L>R)   3. Grade 1 spondylolisthesis of L4 over L5   4. Lumbar facet hypertrophy (Bilateral)   5. Spondylosis without myelopathy or radiculopathy, lumbosacral region   6. DDD (degenerative disc disease), lumbar   7. Chronic lower extremity pain (2ry area of Pain) (Left)   8. Chronic hip pain (3ry area of Pain) (Bilateral) (R>L)   9. Bertolotti's syndrome (Left)   10. DDD (degenerative disc  disease), thoracolumbar   11. Severe (5-6 mm) L4-5 lumbar spinal stenosis and (8 mm) L3-4 spinal stenosis.   12. Chronic lumbar radicular pain (Left) (L5 Dermatome)   13. Scoliosis of thoracolumbar region due to degenerative disease of spine in adult   14. Chronic pain syndrome   15. Pharmacologic therapy   16. Chronic use of opiate for therapeutic purpose   17. Encounter for medication management   18. Encounter for chronic pain management    Worsened Worsened Worsened   Updated Problems: No problems updated.  Plan of Care  Problem-specific:  No problem-specific Assessment & Plan notes found for this encounter.  Ms. NEYAH ELLERMAN has a current medication list which includes the following long-term medication(s): bupropion, doxepin, [START ON 10/28/2022] oxycodone, [START ON 11/27/2022] oxycodone, [START ON 12/27/2022] oxycodone, and  zolpidem.  Pharmacotherapy (Medications Ordered): Meds ordered this encounter  Medications   oxyCODONE (OXY IR/ROXICODONE) 5 MG immediate release tablet    Sig: Take 1 tablet (5 mg total) by mouth daily as needed for severe pain. Must last 30 days    Dispense:  30 tablet    Refill:  0    DO NOT: delete (not duplicate); no partial-fill (will deny script to complete), no refill request (F/U required). DISPENSE: 1 day early if closed on fill date. WARN: No CNS-depressants within 8 hrs of med.   oxyCODONE (OXY IR/ROXICODONE) 5 MG immediate release tablet    Sig: Take 1 tablet (5 mg total) by mouth daily as needed for severe pain. Must last 30 days    Dispense:  30 tablet    Refill:  0    DO NOT: delete (not duplicate); no partial-fill (will deny script to complete), no refill request (F/U required). DISPENSE: 1 day early if closed on fill date. WARN: No CNS-depressants within 8 hrs of med.   oxyCODONE (OXY IR/ROXICODONE) 5 MG immediate release tablet    Sig: Take 1 tablet (5 mg total) by mouth daily as needed for severe pain. Must last 30 days    Dispense:  30 tablet    Refill:  0    DO NOT: delete (not duplicate); no partial-fill (will deny script to complete), no refill request (F/U required). DISPENSE: 1 day early if closed on fill date. WARN: No CNS-depressants within 8 hrs of med.   naloxone (NARCAN) nasal spray 4 mg/0.1 mL    Sig: Place 1 spray into the nose as needed for up to 365 doses (for opioid-induced respiratory depresssion). In case of emergency (overdose), spray once into each nostril. If no response within 3 minutes, repeat application and call [phone].    Dispense:  1 each    Refill:  0    Instruct patient in proper use of device.   Orders:  Orders Placed This Encounter  Procedures   LUMBAR FACET(MEDIAL BRANCH NERVE BLOCK) MBNB    Standing Status:   Future    Standing Expiration Date:   01/21/2023    Scheduling Instructions:     Procedure: Lumbar facet block (AKA.:  Lumbosacral medial branch nerve block)     Side: Left-sided     Level: L4-5 & L5-S1 Facets (L3, L4, L5, & S1 Medial Branch Nerves)     Sedation: Patient's choice.     Timeframe: ASAA    Order Specific Question:   Where will this procedure be performed?    Answer:   [facility]   Follow-up plan:   Return in about 3 months (around 01/26/2023) for  Eval-day (M,W), (F2F), (MM).      Interventional Therapies  Risk Factors  Complex Considerations:  Advanced age   Planned  Pending:   Diagnostic/therapeutic left L4-5 and L5-S1 lumbar facet MBB    Under consideration:   Diagnostic/therapeutic left L4-5 and L5-S1 lumbar facet MBB  Possible lumbar facet RFA    Completed:   Diagnostic left L5-S1 pseudoarthrosis inj. #1 (01/09/2022) (10/10 to 0/10)  Therapeutic left L4-5 LESI x8 (06/25/2021) (100/100/80/90)  Therapeutic right L4-5 LESI x1 (07/21/2017) (DNR-F/U)  Palliative right L5-S1 LESI x1 (09/27/2015) (100/100/90)  Therapeutic left L5-S1 LESI x1 (02/05/2016) (100/100/100/100)  Diagnostic right SI joint injection x2 (08/25/2019) (100/100/40/30)  Diagnostic left SI joint injection x2 (01/27/2019) (100/100/100/100)  Diagnostic left glenohumeral joint inj. x1 (04/29/2017) (100/100/98/98)  Left biceps muscle trigger point inj. x1 (07/21/2017) (DNR-F/U)  Right PSIS trigger point inj. x1 (07/27/2018) (100/100/50/>50)  Diagnostic right lumbar facet MBB x3 (01/09/2022) (10/10 to 0/10)  Diagnostic left lumbar facet MBB x4 (01/09/2022) (10/10 to 0/10)  Diagnostic left IA hip inj. x1 (01/27/2019) (100/100/100/100)    Completed by other providers:   None at this time   Therapeutic  Palliative (PRN) options:   None established     Recent Visits Date Type Provider Dept  07/30/22 Office Visit Milinda Pointer, MD [facility]  Showing recent visits within past 90 days and meeting all other requirements Today's Visits Date Type Provider Dept  10/22/22 Office Visit Milinda Pointer, MD [facility]  Showing today's visits and meeting all other requirements Future Appointments No visits were found meeting these conditions. Showing future appointments within next 90 days and meeting all other requirements  I discussed the assessment and treatment plan with the patient. The patient was provided an opportunity to ask questions and all were answered. The patient agreed with the plan and demonstrated an understanding of the instructions.  Patient advised to call back or seek an in-person evaluation if the symptoms or condition worsens.  Duration of encounter: 30 minutes.  Total time on encounter, as per AMA guidelines included both the face-to-face and non-face-to-face time personally spent by the physician and/or other qualified health care professional(s) on the day of the encounter (includes time in activities that require the physician or other qualified health care professional and does not include time in activities normally performed by clinical staff). Physician's time may include the following activities when performed: preparing to see the patient (eg, review of tests, pre-charting review of records) obtaining and/or reviewing separately obtained history performing a medically appropriate examination and/or evaluation counseling and educating the patient/family/caregiver ordering medications, tests, or procedures referring and communicating with other health care professionals (when not separately reported) documenting clinical information in the electronic or other health record independently interpreting results (not separately reported) and communicating results to the patient/ family/caregiver care coordination (not separately reported)  Note by: Gaspar Cola, MD Date: 10/22/2022; Time: 10:28 AM

## 2022-10-26 NOTE — Progress Notes (Unsigned)
PROVIDER NOTE: Interpretation of information contained herein should be left to medically-trained personnel. Specific patient instructions are provided elsewhere under "Patient Instructions" section of medical record. This document was created in part using STT-dictation technology, any transcriptional errors that may result from this process are unintentional.  Patient: Cynthia Bennett Type: Established DOB: 1933/09/23 MRN: [id number] PCP: Jerl Mina, MD  Service: Procedure DOS: 10/28/2022 Setting: Ambulatory Location: Ambulatory outpatient facility Delivery: Face-to-face Provider: Oswaldo Done, MD Specialty: Interventional Pain Management Specialty designation: 09 Location: Outpatient facility Ref. Prov.: Jerl Mina, MD    Procedure:           Type: Lumbar Facet, Medial Branch Block(s) #5  Laterality: Left  Level: L3, L4, L5, and S1 Medial Branch Level(s). Injecting these levels blocks the L4-5 and L5-S1 lumbar facet joints. Imaging: Fluoroscopic guidance         Anesthesia: Local anesthesia (1-2% Lidocaine) Anxiolysis: IV Versed         Sedation:                         DOS: 10/28/2022 Performed by: Oswaldo Done, MD  Primary Purpose: Diagnostic/Therapeutic Indications: Low back pain severe enough to impact quality of life or function. No diagnosis found. NAS-11 Pain score:   Pre-procedure:  /10   Post-procedure:  /10     Position / Prep / Materials:  Position: Prone  Prep solution: DuraPrep (Iodine Povacrylex [0.7% available iodine] and Isopropyl Alcohol, 74% w/w) Area Prepped: Posterolateral Lumbosacral Spine (Wide prep: From the lower border of the scapula down to the end of the tailbone and from flank to flank.)  Materials:  Tray: Block Needle(s):  Type: Spinal  Gauge (G): 22  Length: 5-in Qty: 4     Pre-op H&P Assessment:  Cynthia Bennett is a 88 y.o. (year old), female patient, seen today for interventional treatment. She  has a past surgical history  that includes Esophagogastroduodenoscopy (N/A, 04/16/2015); Appendectomy; and Abdominal hysterectomy. Cynthia Bennett has a current medication list which includes the following prescription(s): acetaminophen, bupropion, vitamin d3, dorzolamide-timolol, doxepin, esomeprazole, magnesium oxide, naloxone, ondansetron, [START ON 10/28/2022] oxycodone, [START ON 11/27/2022] oxycodone, [START ON 12/27/2022] oxycodone, sertraline, and zolpidem. Her primarily concern today is the No chief complaint on file.  Initial Vital Signs:  Pulse/HCG Rate:    Temp:   Resp:   BP:   SpO2:    BMI: Estimated body mass index is 24.8 kg/m as calculated from the following:   Height as of 10/22/22: 5\' 3"  (1.6 m).   Weight as of 10/22/22: 140 lb (63.5 kg).  Risk Assessment: Allergies: Reviewed. She is allergic to ibuprofen.  Allergy Precautions: None required Coagulopathies: Reviewed. None identified.  Blood-thinner therapy: None at this time Active Infection(s): Reviewed. None identified. Cynthia Bennett is afebrile  Site Confirmation: Cynthia Bennett was asked to confirm the procedure and laterality before marking the site Procedure checklist: Completed Consent: Before the procedure and under the influence of no sedative(s), amnesic(s), or anxiolytics, the patient was informed of the treatment options, risks and possible complications. To fulfill our ethical and legal obligations, as recommended by the American Medical Association's Code of Ethics, I have informed the patient of my clinical impression; the nature and purpose of the treatment or procedure; the risks, benefits, and possible complications of the intervention; the alternatives, including doing nothing; the risk(s) and benefit(s) of the alternative treatment(s) or procedure(s); and the risk(s) and benefit(s) of doing nothing. The patient was provided information  about the general risks and possible complications associated with the procedure. These may include, but are not limited  to: failure to achieve desired goals, infection, bleeding, organ or nerve damage, allergic reactions, paralysis, and death. In addition, the patient was informed of those risks and complications associated to Spine-related procedures, such as failure to decrease pain; infection (i.e.: Meningitis, epidural or intraspinal abscess); bleeding (i.e.: epidural hematoma, subarachnoid hemorrhage, or any other type of intraspinal or peri-dural bleeding); organ or nerve damage (i.e.: Any type of peripheral nerve, nerve root, or spinal cord injury) with subsequent damage to sensory, motor, and/or autonomic systems, resulting in permanent pain, numbness, and/or weakness of one or several areas of the body; allergic reactions; (i.e.: anaphylactic reaction); and/or death. Furthermore, the patient was informed of those risks and complications associated with the medications. These include, but are not limited to: allergic reactions (i.e.: anaphylactic or anaphylactoid reaction(s)); adrenal axis suppression; blood sugar elevation that in diabetics may result in ketoacidosis or comma; water retention that in patients with history of congestive heart failure may result in shortness of breath, pulmonary edema, and decompensation with resultant heart failure; weight gain; swelling or edema; medication-induced neural toxicity; particulate matter embolism and blood vessel occlusion with resultant organ, and/or nervous system infarction; and/or aseptic necrosis of one or more joints. Finally, the patient was informed that Medicine is not an exact science; therefore, there is also the possibility of unforeseen or unpredictable risks and/or possible complications that may result in a catastrophic outcome. The patient indicated having understood very clearly. We have given the patient no guarantees and we have made no promises. Enough time was given to the patient to ask questions, all of which were answered to the patient's satisfaction.  Cynthia Bennett has indicated that she wanted to continue with the procedure. Attestation: I, the ordering provider, attest that I have discussed with the patient the benefits, risks, side-effects, alternatives, likelihood of achieving goals, and potential problems during recovery for the procedure that I have provided informed consent. Date  Time: {CHL [facility]IN TIME CHOICES:[id number]}  Pre-Procedure Preparation:  Monitoring: As per clinic protocol. Respiration, ETCO2, SpO2, BP, heart rate and rhythm monitor placed and checked for adequate function Safety Precautions: Patient was assessed for positional comfort and pressure points before starting the procedure. Time-out: I initiated and conducted the "Time-out" before starting the procedure, as per protocol. The patient was asked to participate by confirming the accuracy of the "Time Out" information. Verification of the correct person, site, and procedure were performed and confirmed by me, the nursing staff, and the patient. "Time-out" conducted as per Joint Commission's Universal Protocol (UP.01.01.01). Time:    Description of Procedure:          Laterality: Left Targeted Levels: L3, L4, L5, and S1  Medial Branch Level(s)  Safety Precautions: Aspiration looking for blood return was conducted prior to all injections. At no point did we inject any substances, as a needle was being advanced. Before injecting, the patient was told to immediately notify me if she was experiencing any new onset of "ringing in the ears, or metallic taste in the mouth". No attempts were made at seeking any paresthesias. Safe injection practices and needle disposal techniques used. Medications properly checked for expiration dates. SDV (single dose vial) medications used. After the completion of the procedure, all disposable equipment used was discarded in the proper designated medical waste containers. Local Anesthesia: Protocol guidelines were followed. The patient was  positioned over the fluoroscopy table. The area was prepped  in the usual manner. The time-out was completed. The target area was identified using fluoroscopy. A 12-in long, straight, sterile hemostat was used with fluoroscopic guidance to locate the targets for each level blocked. Once located, the skin was marked with an approved surgical skin marker. Once all sites were marked, the skin (epidermis, dermis, and hypodermis), as well as deeper tissues (fat, connective tissue and muscle) were infiltrated with a small amount of a short-acting local anesthetic, loaded on a 10cc syringe with a 25G, 1.5-in  Needle. An appropriate amount of time was allowed for local anesthetics to take effect before proceeding to the next step. Local Anesthetic: Lidocaine 2.0% The unused portion of the local anesthetic was discarded in the proper designated containers. Technical description of process:  L2 Medial Branch Nerve Block (MBB): The target area for the L2 medial branch is at the junction of the postero-lateral aspect of the superior articular process and the superior, posterior, and medial edge of the transverse process of L3. Under fluoroscopic guidance, a Quincke needle was inserted until contact was made with os over the superior postero-lateral aspect of the pedicular shadow (target area). After negative aspiration for blood, 0.5 mL of the nerve block solution was injected without difficulty or complication. The needle was removed intact. L3 Medial Branch Nerve Block (MBB): The target area for the L3 medial branch is at the junction of the postero-lateral aspect of the superior articular process and the superior, posterior, and medial edge of the transverse process of L4. Under fluoroscopic guidance, a Quincke needle was inserted until contact was made with os over the superior postero-lateral aspect of the pedicular shadow (target area). After negative aspiration for blood, 0.5 mL of the nerve block solution was  injected without difficulty or complication. The needle was removed intact. L4 Medial Branch Nerve Block (MBB): The target area for the L4 medial branch is at the junction of the postero-lateral aspect of the superior articular process and the superior, posterior, and medial edge of the transverse process of L5. Under fluoroscopic guidance, a Quincke needle was inserted until contact was made with os over the superior postero-lateral aspect of the pedicular shadow (target area). After negative aspiration for blood, 0.5 mL of the nerve block solution was injected without difficulty or complication. The needle was removed intact. L5 Medial Branch Nerve Block (MBB): The target area for the L5 medial branch is at the junction of the postero-lateral aspect of the superior articular process and the superior, posterior, and medial edge of the sacral ala. Under fluoroscopic guidance, a Quincke needle was inserted until contact was made with os over the superior postero-lateral aspect of the pedicular shadow (target area). After negative aspiration for blood, 0.5 mL of the nerve block solution was injected without difficulty or complication. The needle was removed intact. S1 Medial Branch Nerve Block (MBB): The target area for the S1 medial branch is at the posterior and inferior 6 o'clock position of the L5-S1 facet joint. Under fluoroscopic guidance, the Quincke needle inserted for the L5 MBB was redirected until contact was made with os over the inferior and postero aspect of the sacrum, at the 6 o' clock position under the L5-S1 facet joint (Target area). After negative aspiration for blood, 0.5 mL of the nerve block solution was injected without difficulty or complication. The needle was removed intact.  Once the entire procedure was completed, the treated area was cleaned, making sure to leave some of the prepping solution back to take advantage of  its long term bactericidal properties.         Illustration  of the posterior view of the lumbar spine and the posterior neural structures. Laminae of L2 through S1 are labeled. DPRL5, dorsal primary ramus of L5; DPRS1, dorsal primary ramus of S1; DPR3, dorsal primary ramus of L3; FJ, facet (zygapophyseal) joint L3-L4; I, inferior articular process of L4; LB1, lateral branch of dorsal primary ramus of L1; IAB, inferior articular branches from L3 medial branch (supplies L4-L5 facet joint); IBP, intermediate branch plexus; MB3, medial branch of dorsal primary ramus of L3; NR3, third lumbar nerve root; S, superior articular process of L5; SAB, superior articular branches from L4 (supplies L4-5 facet joint also); TP3, transverse process of L3.  There were no vitals filed for this visit.   Start Time:   hrs. End Time:   hrs.  Imaging Guidance (Spinal):          Type of Imaging Technique: Fluoroscopy Guidance (Spinal) Indication(s): Assistance in needle guidance and placement for procedures requiring needle placement in or near specific anatomical locations not easily accessible without such assistance. Exposure Time: Please see nurses notes. Contrast: None used. Fluoroscopic Guidance: I was personally present during the use of fluoroscopy. "Tunnel Vision Technique" used to obtain the best possible view of the target area. Parallax error corrected before commencing the procedure. "Direction-depth-direction" technique used to introduce the needle under continuous pulsed fluoroscopy. Once target was reached, antero-posterior, oblique, and lateral fluoroscopic projection used confirm needle placement in all planes. Images permanently stored in EMR. Interpretation: No contrast injected. I personally interpreted the imaging intraoperatively. Adequate needle placement confirmed in multiple planes. Permanent images saved into the patient's record.  Antibiotic Prophylaxis:   Anti-infectives (From admission, onward)    None      Indication(s): None  identified  Post-operative Assessment:  Post-procedure Vital Signs:  Pulse/HCG Rate:    Temp:   Resp:   BP:   SpO2:    EBL: None  Complications: No immediate post-treatment complications observed by team, or reported by patient.  Note: The patient tolerated the entire procedure well. A repeat set of vitals were taken after the procedure and the patient was kept under observation following institutional policy, for this type of procedure. Post-procedural neurological assessment was performed, showing return to baseline, prior to discharge. The patient was provided with post-procedure discharge instructions, including a section on how to identify potential problems. Should any problems arise concerning this procedure, the patient was given instructions to immediately contact us, at any time, without hesitation. In any case, we plan to contact the patient by telephone for a follow-up status report regarding this interventional procedure.  Comments:  No additional relevant information.  Plan of Care  Orders:  No orders of the defined types were placed in this encounter.  Chronic Opioid Analgesic:  Oxycodone IR 5 mg, 1 tab PO daily (maximum of 5 mg/day of oxycodone.) MME/day: 7.5 mg/day.   Medications ordered for procedure: No orders of the defined types were placed in this encounter.  Medications administered: Cynthia Bennett had no medications administered during this visit.  See the medical record for exact dosing, route, and time of administration.  Follow-up plan:   No follow-ups on file.       Interventional Therapies  Risk Factors  Complex Considerations:  Advanced age   Planned  Pending:   Diagnostic/therapeutic left L4-5 and L5-S1 lumbar facet MBB    Under consideration:   Diagnostic/therapeutic left L4-5 and L5-S1 lumbar facet  MBB  Possible lumbar facet RFA    Completed:   Diagnostic left L5-S1 pseudoarthrosis inj. #1 (01/09/2022) (10/10 to 0/10)  Therapeutic left  L4-5 LESI x8 (06/25/2021) (100/100/80/90)  Therapeutic right L4-5 LESI x1 (07/21/2017) (DNR-F/U)  Palliative right L5-S1 LESI x1 (09/27/2015) (100/100/90)  Therapeutic left L5-S1 LESI x1 (02/05/2016) (100/100/100/100)  Diagnostic right SI joint injection x2 (08/25/2019) (100/100/40/30)  Diagnostic left SI joint injection x2 (01/27/2019) (100/100/100/100)  Diagnostic left glenohumeral joint inj. x1 (04/29/2017) (100/100/98/98)  Left biceps muscle trigger point inj. x1 (07/21/2017) (DNR-F/U)  Right PSIS trigger point inj. x1 (07/27/2018) (100/100/50/>50)  Diagnostic right lumbar facet MBB x3 (01/09/2022) (10/10 to 0/10)  Diagnostic left lumbar facet MBB x4 (01/09/2022) (10/10 to 0/10)  Diagnostic left IA hip inj. x1 (01/27/2019) (100/100/100/100)    Completed by other providers:   None at this time   Therapeutic  Palliative (PRN) options:   None established      Recent Visits Date Type Provider Dept  10/22/22 Office Visit Delano Metz, MD [facility]  07/30/22 Office Visit Delano Metz, MD [facility]  Showing recent visits within past 90 days and meeting all other requirements Future Appointments Date Type Provider Dept  10/28/22 Appointment Delano Metz, MD [facility]  01/21/23 Appointment Delano Metz, MD [facility]  Showing future appointments within next 90 days and meeting all other requirements  Disposition: Discharge home  Discharge (Date  Time): 10/28/2022;   hrs.   Primary Care Physician: Jerl Mina, MD Location: [facility] Note by: Oswaldo Done, MD Date: 10/28/2022; Time: 11:18 AM  Disclaimer:  Medicine is not an Visual merchandiser. The only guarantee in medicine is that nothing is guaranteed. It is important to note that the decision to proceed with this intervention was based on the information collected from the patient. The Data and conclusions were drawn from the  patient's questionnaire, the interview, and the physical examination. Because the information was provided in large part by the patient, it cannot be guaranteed that it has not been purposely or unconsciously manipulated. Every effort has been made to obtain as much relevant data as possible for this evaluation. It is important to note that the conclusions that lead to this procedure are derived in large part from the available data. Always take into account that the treatment will also be dependent on availability of resources and existing treatment guidelines, considered by other Pain Management Practitioners as being common knowledge and practice, at the time of the intervention. For Medico-Legal purposes, it is also important to point out that variation in procedural techniques and pharmacological choices are the acceptable norm. The indications, contraindications, technique, and results of the above procedure should only be interpreted and judged by a Board-Certified Interventional Pain Specialist with extensive familiarity and expertise in the same exact procedure and technique.

## 2022-10-28 ENCOUNTER — Ambulatory Visit: Payer: PPO | Attending: Pain Medicine | Admitting: Pain Medicine

## 2022-10-28 ENCOUNTER — Ambulatory Visit
Admission: RE | Admit: 2022-10-28 | Discharge: 2022-10-28 | Disposition: A | Discharge status: Home / Self Care | Payer: PPO | Source: Ambulatory Visit | Attending: Pain Medicine | Admitting: Pain Medicine

## 2022-10-28 ENCOUNTER — Encounter: Payer: Self-pay | Admitting: Pain Medicine

## 2022-10-28 VITALS — BP 82/57 | HR 63 | Temp 97.2°F | Resp 21 | Ht 62.0 in | Wt 140.0 lb

## 2022-10-28 DIAGNOSIS — M47817 Spondylosis without myelopathy or radiculopathy, lumbosacral region: Secondary | ICD-10-CM | POA: Diagnosis not present

## 2022-10-28 DIAGNOSIS — Q7649 Other congenital malformations of spine, not associated with scoliosis: Secondary | ICD-10-CM | POA: Insufficient documentation

## 2022-10-28 DIAGNOSIS — M545 Low back pain, unspecified: Secondary | ICD-10-CM | POA: Insufficient documentation

## 2022-10-28 DIAGNOSIS — M5136 Other intervertebral disc degeneration, lumbar region: Secondary | ICD-10-CM | POA: Insufficient documentation

## 2022-10-28 DIAGNOSIS — M47816 Spondylosis without myelopathy or radiculopathy, lumbar region: Secondary | ICD-10-CM

## 2022-10-28 DIAGNOSIS — G8929 Other chronic pain: Secondary | ICD-10-CM | POA: Insufficient documentation

## 2022-10-28 MED ORDER — MIDAZOLAM HCL 2 MG/2ML IJ SOLN: 0.5000 mg | Freq: Once | INTRAMUSCULAR | Status: DC

## 2022-10-28 MED ORDER — LIDOCAINE HCL 2 % IJ SOLN
20.0000 mL | Freq: Once | INTRAMUSCULAR | Status: AC
  Administered 2022-10-28: 400 mg

## 2022-10-28 MED ORDER — LACTATED RINGERS IV SOLN: Freq: Once | INTRAVENOUS | Status: DC

## 2022-10-28 MED ORDER — TRIAMCINOLONE ACETONIDE 40 MG/ML IJ SUSP
40.0000 mg | Freq: Once | INTRAMUSCULAR | Status: AC
  Administered 2022-10-28: 40 mg

## 2022-10-28 MED ORDER — TRIAMCINOLONE ACETONIDE 40 MG/ML IJ SUSP
INTRAMUSCULAR | Status: AC
  Filled 2022-10-28: qty 1

## 2022-10-28 MED ORDER — ROPIVACAINE HCL 2 MG/ML IJ SOLN
9.0000 mL | Freq: Once | INTRAMUSCULAR | Status: AC
  Administered 2022-10-28: 9 mL via PERINEURAL

## 2022-10-28 MED ORDER — PENTAFLUOROPROP-TETRAFLUOROETH EX AERO
INHALATION_SPRAY | Freq: Once | CUTANEOUS | Status: AC
  Administered 2022-10-28: 1 via TOPICAL

## 2022-10-28 MED ORDER — ROPIVACAINE HCL 2 MG/ML IJ SOLN
INTRAMUSCULAR | Status: AC
  Filled 2022-10-28: qty 20

## 2022-10-28 MED ORDER — LIDOCAINE HCL 2 % IJ SOLN
INTRAMUSCULAR | Status: AC
  Filled 2022-10-28: qty 20

## 2022-10-28 NOTE — Patient Instructions (Signed)
____________________________________________________________________________________________  Virtual Visits   ID our calls: Add these numbers to your list of contacts on your smart phone. Label it as "PAIN Management" Nursing: [phone] (Main) Dr. Annaleese Guier: [phone]   What is a "Virtual Visit"? It is an electronic healthcare encounter (medical visit) that takes place on real time (NOT TEXT or E-MAIL) over the telephone or computer device (desktop, laptop, tablet, smart phone, etc.). It allows for more communication flexibility between the patient and the healthcare provider.  Who decides when these types of visits will be used? The physician.  Who is eligible for these types of visits? Only those patients that can be reliably reached over the telephone.  What do you mean by reliably? We do not have time to call everyone multiple times, therefore those patients that tend to screen calls and then call back later are not suitable candidates for this system. We all hate telemarketers and "Robocalls".  We understand how people are reluctant to pickup on calls from "unknown numbers", therefore, we suggest you add our numbers to your list of contacts. This way, you should be able to identify our calls. All of our numbers are available above.   Who is not eligible? This option is not appropriate for medication management. Patients on controlled substances have to come in for "Face-to-Face" encounters. Monitoring of these substances is mandatory. Virtual visits do not allow for unannounced drug screening tests or pill counts. Not bringing your pills, or the empty bottles, may result in no refill.  When will this type of visits be used? For follow-up after procedures on established patients, as long as they have had the same procedure done before.  Whenever you are physically unable to attend a regular appointment.   Can I request my medication visit to be "Virtual"? Yes. Available on  a limited basis, only if you are unable to physically attend your appointment. However, you may only receive a 30-day prescription. Abuse of this option may result in discontinuation of medication due to inability to properly monitor the therapy.  When will I be called?  You will receive an initial call from [phone], from our nursing staff, one business day prior to your appointment. (For Monday appointments you will be called on Friday.) The purpose of this call is to review your medications and the results of any recent procedure.  If the nursing staff is unable to make contact, your virtual encounter may be canceled and rescheduled to a face-to-face visit.  Your provider will call you on the day of your appointment.  At what time will I be called? Providers will call whenever there is time available. Do not expect calls at any specific time. On the schedule, you will have an appointment time assigned to you however, this is seldom accurate. This is done simply to keep a list of patients to be called. Be advised that calls may come at anytime during the day. Calls start as early as 8:00 AM and go as late as 8:00 PM. This will depend on provider availability. The system is not perfect. If this is inconvenient for you, please request to be changed to an "in-person" appointment.   ____________________________________________________________________________________________    ____________________________________________________________________________________________  Post-Procedure Discharge Instructions  Instructions: Apply ice:  Purpose: This will minimize any swelling and discomfort after procedure.  When: Day of procedure, as soon as you get home. How: Fill a plastic sandwich bag with crushed ice. Cover it with a small towel and apply to injection   site. How long: (15 min on, 15 min off) Apply for 15 minutes then remove x 15 minutes.  Repeat sequence on day of procedure, until you go to  bed. Apply heat:  Purpose: To treat any soreness and discomfort from the procedure. When: Starting the next day after the procedure. How: Apply heat to procedure site starting the day following the procedure. How long: May continue to repeat daily, until discomfort goes away. Food intake: Start with clear liquids (like water) and advance to regular food, as tolerated.  Physical activities: Keep activities to a minimum for the first 8 hours after the procedure. After that, then as tolerated. Driving: If you have received any sedation, be responsible and do not drive. You are not allowed to drive for 24 hours after having sedation. Blood thinner: (Applies only to those taking blood thinners) You may restart your blood thinner 6 hours after your procedure. Insulin: (Applies only to Diabetic patients taking insulin) As soon as you can eat, you may resume your normal dosing schedule. Infection prevention: Keep procedure site clean and dry. Shower daily and clean area with soap and water. Post-procedure Pain Diary: Extremely important that this be done correctly and accurately. Recorded information will be used to determine the next step in treatment. For the purpose of accuracy, follow these rules: Evaluate only the area treated. Do not report or include pain from an untreated area. For the purpose of this evaluation, ignore all other areas of pain, except for the treated area. After your procedure, avoid taking a long nap and attempting to complete the pain diary after you wake up. Instead, set your alarm clock to go off every hour, on the hour, for the initial 8 hours after the procedure. Document the duration of the numbing medicine, and the relief you are getting from it. Do not go to sleep and attempt to complete it later. It will not be accurate. If you received sedation, it is likely that you were given a medication that may cause amnesia. Because of this, completing the diary at a later time may  cause the information to be inaccurate. This information is needed to plan your care. Follow-up appointment: Keep your post-procedure follow-up evaluation appointment after the procedure (usually 2 weeks for most procedures, 6 weeks for radiofrequencies). DO NOT FORGET to bring you pain diary with you.   Expect: (What should I expect to see with my procedure?) From numbing medicine (AKA: Local Anesthetics): Numbness or decrease in pain. You may also experience some weakness, which if present, could last for the duration of the local anesthetic. Onset: Full effect within 15 minutes of injected. Duration: It will depend on the type of local anesthetic used. On the average, 1 to 8 hours.  From steroids (Applies only if steroids were used): Decrease in swelling or inflammation. Once inflammation is improved, relief of the pain will follow. Onset of benefits: Depends on the amount of swelling present. The more swelling, the longer it will take for the benefits to be seen. In some cases, up to 10 days. Duration: Steroids will stay in the system x 2 weeks. Duration of benefits will depend on multiple posibilities including persistent irritating factors. Side-effects: If present, they may typically last 2 weeks (the duration of the steroids). Frequent: Cramps (if they occur, drink Gatorade and take over-the-counter Magnesium 450-500 mg once to twice a day); water retention with temporary weight gain; increases in blood sugar; decreased immune system response; increased appetite. Occasional: Facial flushing (red, warm   cheeks); mood swings; menstrual changes. Uncommon: Long-term decrease or suppression of natural hormones; bone thinning. (These are more common with higher doses or more frequent use. This is why we prefer that our patients avoid having any injection therapies in other practices.)  Very Rare: Severe mood changes; psychosis; aseptic necrosis. From procedure: Some discomfort is to be expected once  the numbing medicine wears off. This should be minimal if ice and heat are applied as instructed.  Call if: (When should I call?) You experience numbness and weakness that gets worse with time, as opposed to wearing off. New onset bowel or bladder incontinence. (Applies only to procedures done in the spine)  Emergency Numbers: Durning business hours (Monday - Thursday, 8:00 AM - 4:00 PM) (Friday, 9:00 AM - 12:00 Noon): [phone] After hours: [phone] NOTE: If you are having a problem and are unable connect with, or to talk to a provider, then go to your nearest urgent care or emergency department. If the problem is serious and urgent, please call 911. ____________________________________________________________________________________________    ____________________________________________________________________________________________  Patient Information update  To: All of our patients.  Re: Name change.  It has been made official that our current name, "[facility]"   will soon be changed to "[facility]".   The purpose of this change is to eliminate any confusion created by the concept of our practice being a "Medication Management Pain Clinic". In the past this has led to the misconception that we treat pain primarily by the use of prescription medications.  Nothing can be farther from the truth.   Understanding PAIN MANAGEMENT: To further understand what our practice does, you first have to understand that "Pain Management" is a subspecialty that requires additional training once a physician has completed their specialty training, which can be in either Anesthesia, Neurology, Psychiatry, or Physical Medicine and Rehabilitation (PMR). Each one of these contributes to the final approach taken by each physician to the management of their patient's pain. To be a "Pain  Management Specialist" you must have first completed one of the specialty trainings below.  Anesthesiologists - trained in clinical pharmacology and interventional techniques such as nerve blockade and regional as well as central neuroanatomy. They are trained to block pain before, during, and after surgical interventions.  Neurologists - trained in the diagnosis and pharmacological treatment of complex neurological conditions, such as Multiple Sclerosis, Parkinson's, spinal cord injuries, and other systemic conditions that may be associated with symptoms that may include but are not limited to pain. They tend to rely primarily on the treatment of chronic pain using prescription medications.  Psychiatrist - trained in conditions affecting the psychosocial wellbeing of patients including but not limited to depression, anxiety, schizophrenia, personality disorders, addiction, and other substance use disorders that may be associated with chronic pain. They tend to rely primarily on the treatment of chronic pain using prescription medications.   Physical Medicine and Rehabilitation (PMR) physicians, also known as physiatrists - trained to treat a wide variety of medical conditions affecting the brain, spinal cord, nerves, bones, joints, ligaments, muscles, and tendons. Their training is primarily aimed at treating patients that have suffered injuries that have caused severe physical impairment. Their training is primarily aimed at the physical therapy and rehabilitation of those patients. They may also work alongside orthopedic surgeons or neurosurgeons using their expertise in assisting surgical patients to recover after their surgeries.  INTERVENTIONAL PAIN MANAGEMENT is sub-subspecialty   of Pain Management.  Our physicians are Board-certified in Anesthesia, Pain Management, and Interventional Pain Management.  This meaning that not only have they been trained and Board-certified in their specialty of  Anesthesia, and subspecialty of Pain Management, but they have also received further training in the sub-subspecialty of Interventional Pain Management, in order to become Board-certified as INTERVENTIONAL PAIN MANAGEMENT SPECIALIST.    Mission: Our goal is to use our skills in  INTERVENTIONAL PAIN MANAGEMENT as alternatives to the chronic use of prescription opioid medications for the treatment of pain. To make this more clear, we have changed our name to reflect what we do and offer. We will continue to offer medication management assessment and recommendations, but we will not be taking over any patient's medication management.  ____________________________________________________________________________________________     

## 2022-10-29 ENCOUNTER — Telehealth: Payer: Self-pay

## 2022-10-29 NOTE — Telephone Encounter (Signed)
Post procedure follow up. Patient states she is doing fine.  

## 2022-10-31 ENCOUNTER — Other Ambulatory Visit: Payer: PPO

## 2022-10-31 ENCOUNTER — Ambulatory Visit: Payer: PPO | Admitting: Nurse Practitioner

## 2022-10-31 ENCOUNTER — Ambulatory Visit: Payer: PPO

## 2022-11-06 ENCOUNTER — Other Ambulatory Visit: Payer: Self-pay

## 2022-11-06 DIAGNOSIS — D509 Iron deficiency anemia, unspecified: Secondary | ICD-10-CM

## 2022-11-06 DIAGNOSIS — E611 Iron deficiency: Secondary | ICD-10-CM

## 2022-11-06 MED FILL — Iron Sucrose Inj 20 MG/ML (Fe Equiv): INTRAVENOUS | Qty: 10 | Status: AC

## 2022-11-07 ENCOUNTER — Encounter: Payer: Self-pay | Admitting: Internal Medicine

## 2022-11-07 ENCOUNTER — Inpatient Hospital Stay: Payer: PPO

## 2022-11-07 ENCOUNTER — Inpatient Hospital Stay (HOSPITAL_BASED_OUTPATIENT_CLINIC_OR_DEPARTMENT_OTHER): Payer: PPO | Admitting: Internal Medicine

## 2022-11-07 ENCOUNTER — Inpatient Hospital Stay: Payer: PPO | Attending: Internal Medicine

## 2022-11-07 VITALS — BP 118/78 | HR 68 | Temp 97.3°F | Resp 16 | Wt 140.5 lb

## 2022-11-07 DIAGNOSIS — M1990 Unspecified osteoarthritis, unspecified site: Secondary | ICD-10-CM | POA: Diagnosis not present

## 2022-11-07 DIAGNOSIS — Z79899 Other long term (current) drug therapy: Secondary | ICD-10-CM | POA: Diagnosis not present

## 2022-11-07 DIAGNOSIS — N1832 Chronic kidney disease, stage 3b: Secondary | ICD-10-CM | POA: Diagnosis not present

## 2022-11-07 DIAGNOSIS — M2550 Pain in unspecified joint: Secondary | ICD-10-CM | POA: Insufficient documentation

## 2022-11-07 DIAGNOSIS — Z87891 Personal history of nicotine dependence: Secondary | ICD-10-CM | POA: Insufficient documentation

## 2022-11-07 DIAGNOSIS — Z8673 Personal history of transient ischemic attack (TIA), and cerebral infarction without residual deficits: Secondary | ICD-10-CM | POA: Insufficient documentation

## 2022-11-07 DIAGNOSIS — R5383 Other fatigue: Secondary | ICD-10-CM | POA: Diagnosis not present

## 2022-11-07 DIAGNOSIS — Z9049 Acquired absence of other specified parts of digestive tract: Secondary | ICD-10-CM | POA: Diagnosis not present

## 2022-11-07 DIAGNOSIS — D631 Anemia in chronic kidney disease: Secondary | ICD-10-CM | POA: Diagnosis not present

## 2022-11-07 DIAGNOSIS — R634 Abnormal weight loss: Secondary | ICD-10-CM | POA: Diagnosis not present

## 2022-11-07 DIAGNOSIS — E611 Iron deficiency: Secondary | ICD-10-CM

## 2022-11-07 DIAGNOSIS — Z886 Allergy status to analgesic agent status: Secondary | ICD-10-CM | POA: Insufficient documentation

## 2022-11-07 DIAGNOSIS — G8929 Other chronic pain: Secondary | ICD-10-CM | POA: Diagnosis not present

## 2022-11-07 DIAGNOSIS — Z825 Family history of asthma and other chronic lower respiratory diseases: Secondary | ICD-10-CM | POA: Insufficient documentation

## 2022-11-07 DIAGNOSIS — M549 Dorsalgia, unspecified: Secondary | ICD-10-CM | POA: Insufficient documentation

## 2022-11-07 DIAGNOSIS — D509 Iron deficiency anemia, unspecified: Secondary | ICD-10-CM

## 2022-11-07 DIAGNOSIS — Z818 Family history of other mental and behavioral disorders: Secondary | ICD-10-CM | POA: Diagnosis not present

## 2022-11-07 LAB — IRON AND TIBC
Iron: 102 ug/dL (ref 28–170)
Saturation Ratios: 36 % — ABNORMAL HIGH (ref 10.4–31.8)
TIBC: 286 ug/dL (ref 250–450)
UIBC: 184 ug/dL

## 2022-11-07 LAB — BASIC METABOLIC PANEL
Anion gap: 5 (ref 5–15)
BUN: 15 mg/dL (ref 8–23)
CO2: 27 mmol/L (ref 22–32)
Calcium: 8.9 mg/dL (ref 8.9–10.3)
Chloride: 104 mmol/L (ref 98–111)
Creatinine, Ser: 0.97 mg/dL (ref 0.44–1.00)
GFR, Estimated: 57 mL/min — ABNORMAL LOW (ref >60)
Glucose, Bld: 79 mg/dL (ref 70–99)
Potassium: 4.2 mmol/L (ref 3.5–5.1)
Sodium: 136 mmol/L (ref 135–145)

## 2022-11-07 LAB — CBC
HCT: 42.3 % (ref 36.0–46.0)
Hemoglobin: 13.8 g/dL (ref 12.0–15.0)
MCH: 28.5 pg (ref 26.0–34.0)
MCHC: 32.6 g/dL (ref 30.0–36.0)
MCV: 87.2 fL (ref 80.0–100.0)
Platelets: 353 10*3/uL (ref 150–400)
RBC: 4.85 MIL/uL (ref 3.87–5.11)
RDW: 14.3 % (ref 11.5–15.5)
WBC: 10.8 10*3/uL — ABNORMAL HIGH (ref 4.0–10.5)
nRBC: 0 % (ref 0.0–0.2)

## 2022-11-07 LAB — FERRITIN: Ferritin: 138 ng/mL (ref 11–307)

## 2022-11-07 NOTE — Progress Notes (Signed)
[facility] CONSULT NOTE  Patient Care Team: Jerl Mina, MD as PCP - General (Family Medicine) Earna Coder, MD as Consulting Physician (Hematology and Oncology)  CHIEF COMPLAINTS/PURPOSE OF CONSULTATION: Anemia   HEMATOLOGY HISTORY  # CHRONIC NORMOCYTIC ANEMIA [since 2019-PCP- Hb 8-9; Iron sat- 5%;NO Ferritin; GFR- 43]EGD/colonoscopy-?2013-2104- pt declines procedures; Intolerance to p.o. iron. IV Venofer-  #CKD stage III/chronic pain back-pain clinic narcotics.  HISTORY OF PRESENTING ILLNESS: In a wheelchair.  Accompanied by son.   Cynthia Bennett 88 y.o.  female history of anemia secondary ?chronic kidney disease vs others is here for follow-up.  Patient had a fall approximately 3 days ago..  Did not hit her head.  Mechanical.  No weight loss.  S/p iron infusions.  Continues to have fatigue.  Denies any blood in stools or black or stools.   Review of Systems  Constitutional:  Positive for malaise/fatigue. Negative for chills, diaphoresis and fever.  HENT:  Negative for nosebleeds and sore throat.   Eyes:  Negative for double vision.  Respiratory:  Negative for cough, hemoptysis, sputum production, shortness of breath and wheezing.   Cardiovascular:  Negative for chest pain, palpitations, orthopnea and leg swelling.  Gastrointestinal:  Negative for abdominal pain, blood in stool, constipation, diarrhea, heartburn, melena, nausea and vomiting.  Genitourinary:  Negative for dysuria, frequency and urgency.  Musculoskeletal:  Positive for back pain and joint pain.  Skin: Negative.  Negative for itching and rash.  Neurological:  Negative for dizziness, tingling, focal weakness, weakness and headaches.  Endo/Heme/Allergies:  Does not bruise/bleed easily.  Psychiatric/Behavioral:  Negative for depression. The patient is not nervous/anxious and does not have insomnia.     MEDICAL HISTORY:  Past Medical History:  Diagnosis Date   Dizzy spells     Fibromyalgia    Glaucoma    Hiatal hernia    Osteoarthritis    Osteoarthritis of spine with radiculopathy, lumbar region 08/14/2015   Peptic ulcer disease    Reflux    Spinal stenosis    Stroke (HCC)    TIA (transient ischemic attack)     SURGICAL HISTORY: Past Surgical History:  Procedure Laterality Date   ABDOMINAL HYSTERECTOMY     APPENDECTOMY     ESOPHAGOGASTRODUODENOSCOPY N/A 04/16/2015   Procedure: ESOPHAGOGASTRODUODENOSCOPY (EGD);  Surgeon: Wallace Cullens, MD;  Location: [facility] ENDOSCOPY;  Service: Gastroenterology;  Laterality: N/A;    SOCIAL HISTORY: Social History   Socioeconomic History   Marital status: Widowed    Spouse name: Not on file   Number of children: Not on file   Years of education: Not on file   Highest education level: Not on file  Occupational History   Not on file  Tobacco Use   Smoking status: Former   Smokeless tobacco: Never  Vaping Use   Vaping Use: Never used  Substance and Sexual Activity   Alcohol use: No    Alcohol/week: 0.0 standard drinks of alcohol   Drug use: No   Sexual activity: Not on file  Other Topics Concern   Not on file  Social History Narrative   Pt lives in [place]; with son. Quit smoking in 1957. No alcohol. Designed clothes/ alterations.    Social Determinants of Health   Financial Resource Strain: Not on file  Food Insecurity: Not on file  Transportation Needs: Not on file  Physical Activity: Not on file  Stress: Not on file  Social Connections: Not on file  Intimate Partner Violence: Not on file  FAMILY HISTORY: Family History  Problem Relation Age of Onset   Mental illness Mother    COPD Father     ALLERGIES:  is allergic to ibuprofen.  MEDICATIONS:  Current Outpatient Medications  Medication Sig Dispense Refill   acetaminophen (TYLENOL) 325 MG tablet Take 650 mg by mouth every 6 (six) hours as needed.     Cholecalciferol (VITAMIN D3) 20 MCG (800 UNIT) TABS Take by mouth 2 (two) times daily.      dorzolamide-timolol (COSOPT) 22.3-6.8 MG/ML ophthalmic solution Place 1 drop into both eyes in the morning and at bedtime.     doxepin (SINEQUAN) 100 MG capsule Take 100 mg by mouth at bedtime.     esomeprazole (NEXIUM) 40 MG capsule Take 40 mg by mouth 2 (two) times daily.     magnesium oxide (MAG-OX) 400 MG tablet Take by mouth.     naloxone (NARCAN) nasal spray 4 mg/0.1 mL Place 1 spray into the nose as needed for up to 365 doses (for opioid-induced respiratory depresssion). In case of emergency (overdose), spray once into each nostril. If no response within 3 minutes, repeat application and call 911. 1 each 0   ondansetron (ZOFRAN) 8 MG tablet TAKE 1 TABLET BY MOUTH EVERY 8 HOURS AS NEEDED FOR NAUSEA AND VOMITING 45 tablet 1   oxyCODONE (OXY IR/ROXICODONE) 5 MG immediate release tablet Take 1 tablet (5 mg total) by mouth daily as needed for severe pain. Must last 30 days 30 tablet 0   [START ON 11/27/2022] oxyCODONE (OXY IR/ROXICODONE) 5 MG immediate release tablet Take 1 tablet (5 mg total) by mouth daily as needed for severe pain. Must last 30 days 30 tablet 0   [START ON 12/27/2022] oxyCODONE (OXY IR/ROXICODONE) 5 MG immediate release tablet Take 1 tablet (5 mg total) by mouth daily as needed for severe pain. Must last 30 days 30 tablet 0   sertraline (ZOLOFT) 100 MG tablet Take 100 mg by mouth daily.     traZODone (DESYREL) 50 MG tablet Take 50 mg by mouth at bedtime as needed.     zolpidem (AMBIEN) 10 MG tablet TAKE 1/2 TO 1 TABLET BY MOUTH EVERY NIGHT AT BEDTIME     buPROPion (WELLBUTRIN SR) 150 MG 12 hr tablet TAKE 1 TABLET BY MOUTH ONCE DAILY (Patient not taking: Reported on 11/07/2022)     No current facility-administered medications for this visit.      PHYSICAL EXAMINATION:   Vitals:   11/07/22 1300  BP: 118/78  Pulse: 68  Resp: 16  Temp: (!) 97.3 F (36.3 C)   Filed Weights   11/07/22 1300  Weight: 140 lb 8 oz (63.7 kg)    Physical Exam HENT:     Head: Normocephalic  and atraumatic.     Mouth/Throat:     Pharynx: No oropharyngeal exudate.  Eyes:     Pupils: Pupils are equal, round, and reactive to light.  Cardiovascular:     Rate and Rhythm: Normal rate and regular rhythm.  Pulmonary:     Effort: Pulmonary effort is normal. No respiratory distress.     Breath sounds: Normal breath sounds. No wheezing.  Abdominal:     General: Bowel sounds are normal. There is no distension.     Palpations: Abdomen is soft. There is no mass.     Tenderness: There is no abdominal tenderness. There is no guarding or rebound.  Musculoskeletal:        General: No tenderness. Normal range of motion.  Cervical back: Normal range of motion and neck supple.  Skin:    General: Skin is warm.  Neurological:     Mental Status: She is alert and oriented to person, place, and time.  Psychiatric:        Mood and Affect: Affect normal.     LABORATORY DATA:  I have reviewed the data as listed Lab Results  Component Value Date   WBC 10.8 (H) 11/07/2022   HGB 13.8 11/07/2022   HCT 42.3 11/07/2022   MCV 87.2 11/07/2022   PLT 353 11/07/2022   Recent Labs    05/20/22 1258 07/31/22 1421 11/07/22 1249  NA 134* 136 136  K 3.7 3.7 4.2  CL 106 108 104  CO2 22 24 27   GLUCOSE 99 85 79  BUN 17 15 15   CREATININE 1.05* 1.00 0.97  CALCIUM 8.5* 8.6* 8.9  GFRNONAA 51* 55* 57*  PROT  --  6.7  --   ALBUMIN  --  3.6  --   AST  --  17  --   ALT  --  10  --   ALKPHOS  --  54  --   BILITOT  --  0.3  --      DG PAIN CLINIC C-ARM 1-60 MIN NO REPORT  Result Date: 10/28/2022 Fluoro was used, but no Radiologist interpretation will be provided. Please refer to "NOTES" tab for provider progress note.   Anemia due to stage 3b chronic kidney disease (HCC) # Anemia iron def/CKD stageIII : Hb-8-9 [MCV- 77] s/p IV iron infusion hemoglobin to 10.9.  Proceed with Venofer today  # Etiology: Unclear etiology.  GI versus CKD.  colo > 10 years ago; patient reluctant with GI procedures  in the past.  Currently awaiting referral with GI- Dr.Wohl in October.   # CKDstage III-STABLE  # Constipation: ? Narcotics-defer to PCP/pain clinic- STABLE   # Chronic back pain/? [facility] [Dr.Neivera]- STABLE  pt [phone]-call  # DISPOSITION:  # venofer today # in 1 week Venofer  # follow up in last week of Oct 2023- MD; ; labs- cbc/bmp/iron studies/ferritin-- possible IVvenofer- Dr.B  Cc; Dr.Hedrick      All questions were answered. The patient knows to call the clinic with any problems, questions or concerns.      November, MD 11/07/2022 2:50 PM

## 2022-11-07 NOTE — Progress Notes (Signed)
Patient reports weakness is worse than usual.  Had a fall 3 nights ago due to blanket getting caught in wheels of walker.  No injury or bleeding to report after fall.

## 2022-11-07 NOTE — Assessment & Plan Note (Addendum)
#   Anemia iron def/CKD stageIII : Hb-8-9 [MCV- 77] s/p IV iron infusion.  hemoglobin to 13. HOLD Venofer today  # Etiology: Unclear etiology.  GI versus CKD.  colo > 10 years ago; patient reluctant with GI procedures in the past. Recommended CT CAP; declines imaging.    # CKD stage III-[GFR 57]- STABLE   # Constipation: ? Narcotics-defer to PCP/pain clinic- STABLE   # Chronic back pain/? [facility] [Dr.Neivera]- STABLE  # weight loss- s/p dental extraction. ? Declines imaging; will make a referral to nutrition.  # DISPOSITION:  # HOLD venofer  # referral to nutrition re: weight loss # follow up in 4 months 2023- MD; ; labs- cbc/bmp/iron studies/ferritin-- possible IVvenofer- Dr.B  Cc; Dr.Hedrick

## 2022-11-18 ENCOUNTER — Ambulatory Visit: Payer: PPO | Attending: Pain Medicine | Admitting: Pain Medicine

## 2022-11-18 DIAGNOSIS — M47816 Spondylosis without myelopathy or radiculopathy, lumbar region: Secondary | ICD-10-CM | POA: Diagnosis not present

## 2022-11-18 DIAGNOSIS — M79605 Pain in left leg: Secondary | ICD-10-CM | POA: Diagnosis not present

## 2022-11-18 DIAGNOSIS — G8929 Other chronic pain: Secondary | ICD-10-CM | POA: Diagnosis not present

## 2022-11-18 DIAGNOSIS — M545 Low back pain, unspecified: Secondary | ICD-10-CM

## 2022-11-18 DIAGNOSIS — Q7649 Other congenital malformations of spine, not associated with scoliosis: Secondary | ICD-10-CM

## 2022-11-18 NOTE — Progress Notes (Signed)
Patient: Cynthia Bennett  Service Category: E/M  Provider: Oswaldo Done, MD  DOB: May 23, 1933  DOS: 11/18/2022  Location: Office  MRN: [id number]  Setting: Ambulatory outpatient  Referring Provider: Jerl Mina, MD  Type: Established Patient  Specialty: Interventional Pain Management  PCP: Jerl Mina, MD  Location: Remote location  Delivery: TeleHealth     Virtual Encounter - Pain Management PROVIDER NOTE: Information contained herein reflects review and annotations entered in association with encounter. Interpretation of such information and data should be left to medically-trained personnel. Information provided to patient can be located elsewhere in the medical record under "Patient Instructions". Document created using STT-dictation technology, any transcriptional errors that may result from process are unintentional.    Contact & Pharmacy Preferred: [phone] Home: [phone] (home) Mobile: [phone] (mobile) E-mail: [name]  [facility] - [place], [place] - [address] [place] [address] Phone: [phone] Fax: [phone]   Pre-screening  Cynthia Bennett offered "in-person" vs "virtual" encounter. She indicated preferring virtual for this encounter.   Reason COVID-19*  Social distancing based on CDC and AMA recommendations.   I contacted Cynthia Bennett on 11/18/2022 via telephone.      I clearly identified myself as Oswaldo Done, MD. I verified that I was speaking with the correct person using two identifiers (Name: Cynthia Bennett, and date of birth: [birth year]).  Consent I sought verbal advanced consent from Cynthia Bennett for virtual visit interactions. I informed Cynthia Bennett of possible security and privacy concerns, risks, and limitations associated with providing "not-in-person" medical evaluation and management services. I also informed Cynthia Bennett of the availability of "in-person" appointments. Finally, I informed her that there would be a  charge for the virtual visit and that she could be  personally, fully or partially, financially responsible for it. Ms. Barrie expressed understanding and agreed to proceed.   Historic Elements   Cynthia Bennett is a 89 y.o. year old, female patient evaluated today after our last contact on 10/28/2022. Cynthia Bennett  has a past medical history of Dizzy spells, Fibromyalgia, Glaucoma, Hiatal hernia, Osteoarthritis, Osteoarthritis of spine with radiculopathy, lumbar region (08/14/2015), Peptic ulcer disease, Reflux, Spinal stenosis, Stroke (HCC), and TIA (transient ischemic attack). She also  has a past surgical history that includes Esophagogastroduodenoscopy (N/A, 04/16/2015); Appendectomy; and Abdominal hysterectomy. Cynthia Bennett has a current medication list which includes the following prescription(s): acetaminophen, vitamin d3, dorzolamide-timolol, doxepin, esomeprazole, magnesium oxide, naloxone, ondansetron, oxycodone, [START ON 11/27/2022] oxycodone, [START ON 12/27/2022] oxycodone, sertraline, trazodone, zolpidem, and bupropion. She  reports that she has quit smoking. She has never used smokeless tobacco. She reports that she does not drink alcohol and does not use drugs. Cynthia Bennett is allergic to ibuprofen.  Estimated body mass index is 25.7 kg/m as calculated from the following:   Height as of 10/28/22: 5\' 2"  (1.575 m).   Weight as of 11/07/22: 140 lb 8 oz (63.7 kg).  HPI  Today, she is being contacted for a post-procedure assessment.  RTCB: 01/21/2023  Post-procedure evaluation   Type: Lumbar Facet, Medial Branch Block(s) #5  Laterality: Left  Level: L2, L3, L4, L5, and S1 Medial Branch Level(s). Injecting these levels blocks the L3-4, L4-5, and L5-S1 lumbar facet joints. Imaging: Fluoroscopic guidance         Anesthesia: Local anesthesia (1-2% Lidocaine) Anxiolysis: None Sedation: No Sedation  DOS: 10/28/2022 Performed by: Gaspar Cola, MD  Primary Purpose:  Diagnostic/Therapeutic Indications: Low back pain severe enough to impact quality of life or function. 1. Lumbar facet syndrome (Bilateral) (L>R)   2. Spondylosis without myelopathy or radiculopathy, lumbosacral region   3. Lumbar facet hypertrophy (Bilateral)   4. DDD (degenerative disc disease), lumbar   5. Chronic low back pain (Left) w/o sciatica   6. Bertolotti's syndrome (Left)    NAS-11 Pain score:   Pre-procedure: 8 /10   Post-procedure: 0-No pain/10   Note: In addition to having blocked the medial branches to the lower lumbar facets, I have also injected a little local anesthetic and steroid into the left transverse process-sacral ala pseudoarthrosis (Bertolotti syndrome).     Effectiveness:  Initial hour after procedure: 100 %. Subsequent 4-6 hours post-procedure: 100 %. Analgesia past initial 6 hours: 100 % (ongoing). Ongoing improvement:  Analgesic: The patient indicates having an ongoing 100% relief of her "old back pain" and her "new back pain".  She is extremely happy with the results and she refers that she was able to enjoy Christmas. Function: Cynthia Bennett reports improvement in function ROM: Cynthia Bennett reports improvement in ROM  Pharmacotherapy Assessment   Opioid Analgesic: Oxycodone IR 5 mg, 1 tab PO daily (maximum of 5 mg/day of oxycodone.) MME/day: 7.5 mg/day.   Monitoring: [place] PMP: PDMP not reviewed this encounter.       Pharmacotherapy: No side-effects or adverse reactions reported. Compliance: No problems identified. Effectiveness: Clinically acceptable. Plan: Refer to "POC". UDS:  Summary  Date Value Ref Range Status  04/28/2022 Note  Final    Comment:    ==================================================================== [employer] Select 13 (MW) ==================================================================== Test                             Result       Flag       Units  Drug Present and Declared for Prescription Verification   Oxycodone                       305          EXPECTED   ng/mg creat   Noroxycodone                   2439         EXPECTED   ng/mg creat    Sources of oxycodone include scheduled prescription medications.    Noroxycodone is an expected metabolite of oxycodone.  ==================================================================== Test                      Result    Flag   Units      Ref Range   Creatinine              41               mg/dL      >=20 ==================================================================== Declared Medications:  The flagging and interpretation on this report are based on the  following declared medications.  Unexpected results may arise from  inaccuracies in the declared medications.   **Note: The testing scope of this panel includes these medications:   Oxycodone   **Note: The testing scope of this panel does not include the  following reported medications:   Acetaminophen (Tylenol)  Bupropion (Wellbutrin)  Dorzolamide  Doxepin  Esomeprazole  Magnesium (Mag-Ox)  Ondansetron (Zofran)  Sertraline (Zoloft)  Timolol  Vitamin D3  Zolpidem (Ambien) ==================================================================== For clinical consultation, please call [phone]. ====================================================================    No results found for: "CBDTHCR", "D8THCCBX", "D9THCCBX"   Laboratory Chemistry Profile   Renal Lab Results  Component Value Date   BUN 15 11/07/2022   CREATININE 0.97 11/07/2022   BCR 11 (L) 01/10/2019   GFRAA 46 (L) 01/10/2019   GFRNONAA 57 (L) 11/07/2022    Hepatic Lab Results  Component Value Date   AST 17 07/31/2022   ALT 10 07/31/2022   ALBUMIN 3.6 07/31/2022   ALKPHOS 54 07/31/2022   LIPASE 139 04/27/2014    Electrolytes Lab Results  Component Value Date   NA 136 11/07/2022   K 4.2 11/07/2022   CL 104 11/07/2022   CALCIUM 8.9 11/07/2022   MG 2.3 01/10/2019    Bone Lab Results  Component Value Date    25OHVITD1 26 (L) 01/10/2019   25OHVITD2 1.6 01/10/2019   25OHVITD3 24 01/10/2019    Inflammation (CRP: Acute Phase) (ESR: Chronic Phase) Lab Results  Component Value Date   CRP 0.9 08/24/2020   ESRSEDRATE 51 (H) 01/10/2019         Note: Above Lab results reviewed.  Imaging  DG PAIN CLINIC C-ARM 1-60 MIN NO REPORT Fluoro was used, but no Radiologist interpretation will be provided.  Please refer to "NOTES" tab for provider progress note.  Assessment  The primary encounter diagnosis was Lumbar facet syndrome (Bilateral) (L>R). Diagnoses of Chronic low back pain (Left) w/o sciatica, Bertolotti's syndrome (Left), and Chronic lower extremity pain (2ry area of Pain) (Left) were also pertinent to this visit.  Plan of Care  Problem-specific:  No problem-specific Assessment & Plan notes found for this encounter.  Cynthia Bennett has a current medication list which includes the following long-term medication(s): doxepin, oxycodone, [START ON 11/27/2022] oxycodone, [START ON 12/27/2022] oxycodone, trazodone, zolpidem, and bupropion.  Pharmacotherapy (Medications Ordered): No orders of the defined types were placed in this encounter.  Orders:  No orders of the defined types were placed in this encounter.  Follow-up plan:   Return for scheduled encounter.     Interventional Therapies  Risk Factors  Complex Considerations:  Advanced age   Planned  Pending:   Diagnostic/therapeutic left L4-5 and L5-S1 lumbar facet MBB    Under consideration:   Diagnostic/therapeutic left L4-5 and L5-S1 lumbar facet MBB  Possible lumbar facet RFA    Completed:   Diagnostic left L5-S1 pseudoarthrosis inj. #1 (01/09/2022) (10/10 to 0/10)  Therapeutic left L4-5 LESI x8 (06/25/2021) (100/100/80/90)  Therapeutic right L4-5 LESI x1 (07/21/2017) (DNR-F/U)  Palliative right L5-S1 LESI x1 (09/27/2015) (100/100/90)  Therapeutic left L5-S1 LESI x1 (02/05/2016) (100/100/100/100)  Diagnostic right SI joint  injection x2 (08/25/2019) (100/100/40/30)  Diagnostic left SI joint injection x2 (01/27/2019) (100/100/100/100)  Diagnostic left glenohumeral joint inj. x1 (04/29/2017) (100/100/98/98)  Left biceps muscle trigger point inj. x1 (07/21/2017) (DNR-F/U)  Right PSIS trigger point inj. x1 (07/27/2018) (100/100/50/>50)  Diagnostic right lumbar facet MBB x3 (01/09/2022) (10/10 to 0/10)  Diagnostic left lumbar facet MBB x4 (01/09/2022) (10/10 to 0/10)  Diagnostic left IA hip inj. x1 (01/27/2019) (100/100/100/100)    Completed by other providers:   None at this time   Therapeutic  Palliative (PRN) options:   None established      Recent Visits Date Type Provider Dept  10/28/22 Procedure visit Milinda Pointer, MD [facility]  10/22/22 Office Visit Milinda Pointer, MD [facility]  Showing recent visits within past 90 days and  meeting all other requirements Today's Visits Date Type Provider Dept  11/18/22 Office Visit Milinda Pointer, MD [facility]  Showing today's visits and meeting all other requirements Future Appointments Date Type Provider Dept  01/21/23 Appointment Milinda Pointer, MD [facility]  Showing future appointments within next 90 days and meeting all other requirements  I discussed the assessment and treatment plan with the patient. The patient was provided an opportunity to ask questions and all were answered. The patient agreed with the plan and demonstrated an understanding of the instructions.  Patient advised to call back or seek an in-person evaluation if the symptoms or condition worsens.  Duration of encounter: 12 minutes.  Note by: Gaspar Cola, MD Date: 11/18/2022; Time: 2:43 PM

## 2022-11-19 IMAGING — CR DG HIP (WITH OR WITHOUT PELVIS) 2-3V*L*
1 series · 3 of 3 positions shown · non-contrast
Comparison: Pelvic and right hip radiographs 07/19/2018

CLINICAL DATA: Chronic left hip pain.

EXAM:
DG HIP (WITH OR WITHOUT PELVIS) 2-3V LEFT

[Series 1: dg hip unilat w or w/o pelvis 2-3 views  · non-contrast · 0.14mm/px · 3 of 3 slices shown]
[im 1/3]
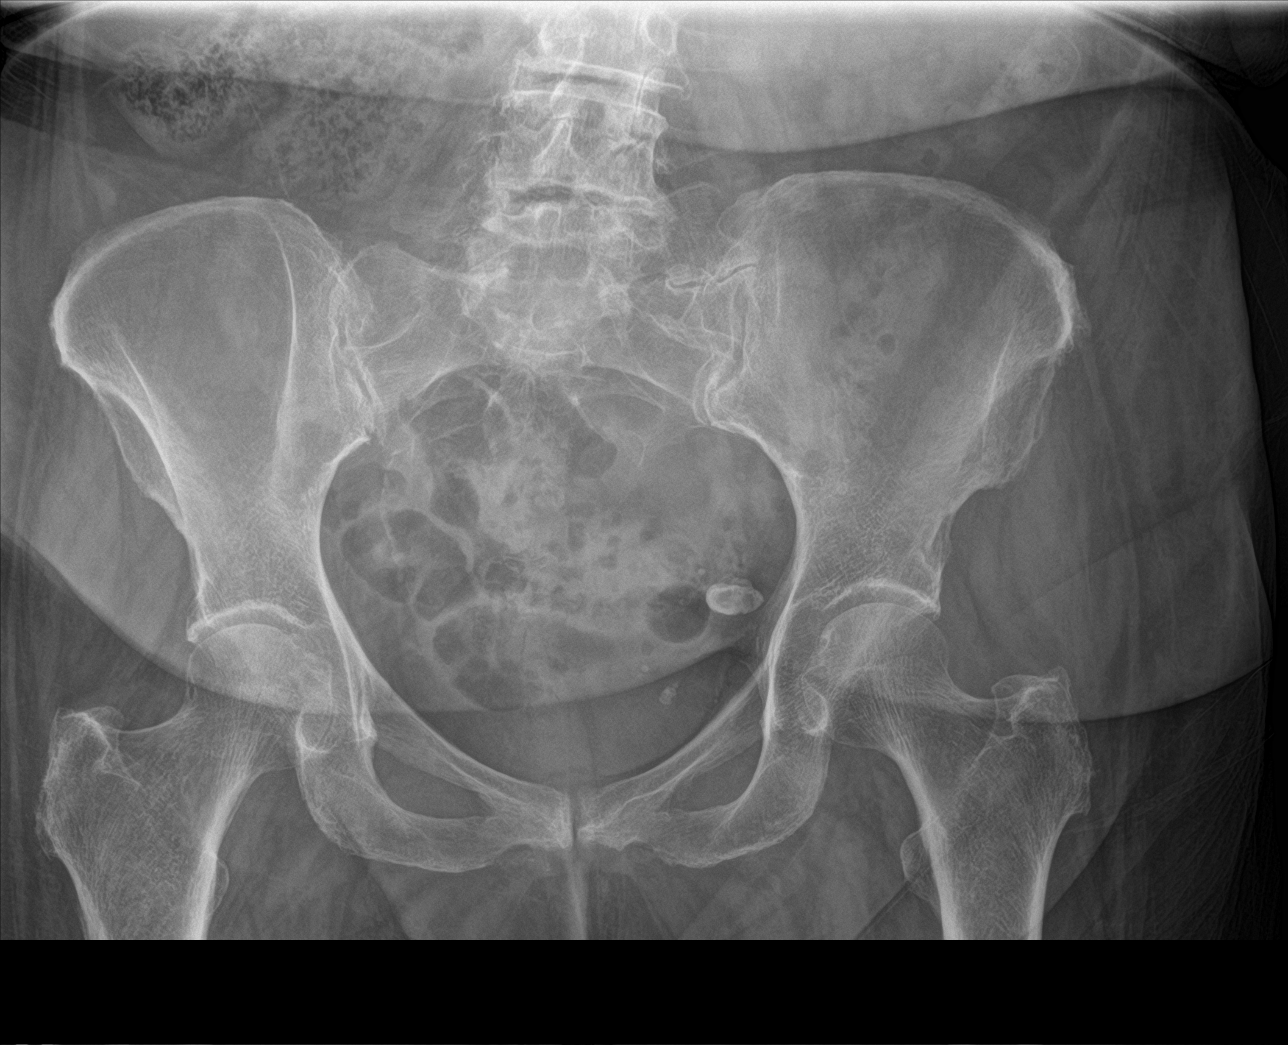
[im 2/3]
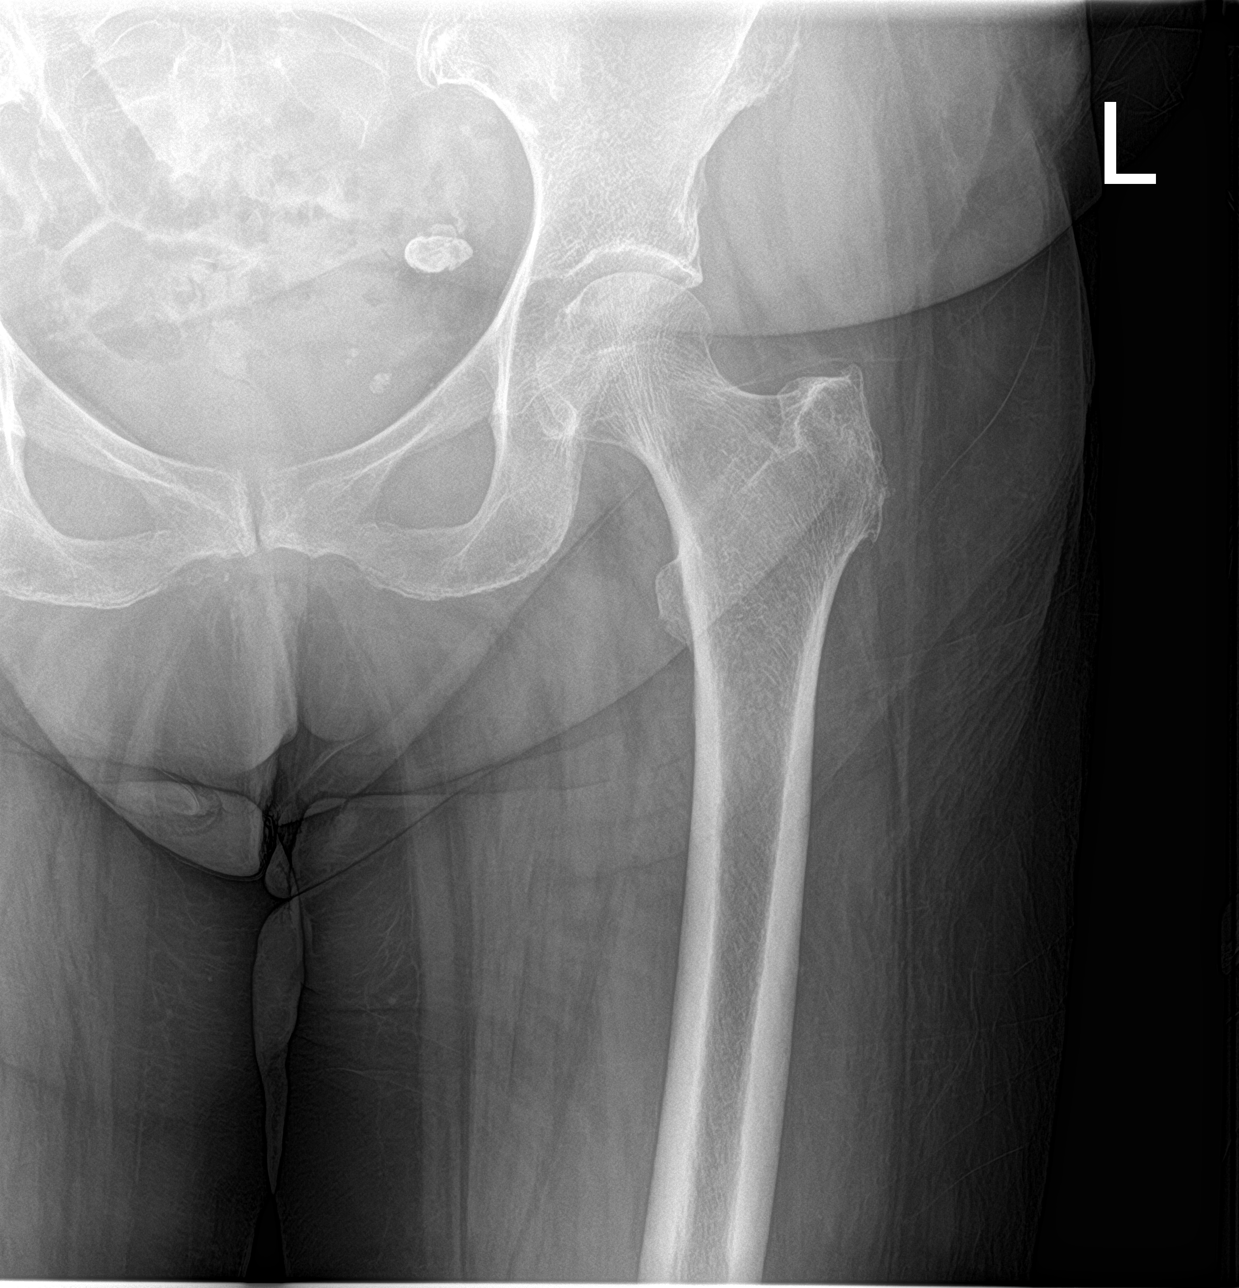
[im 3/3]
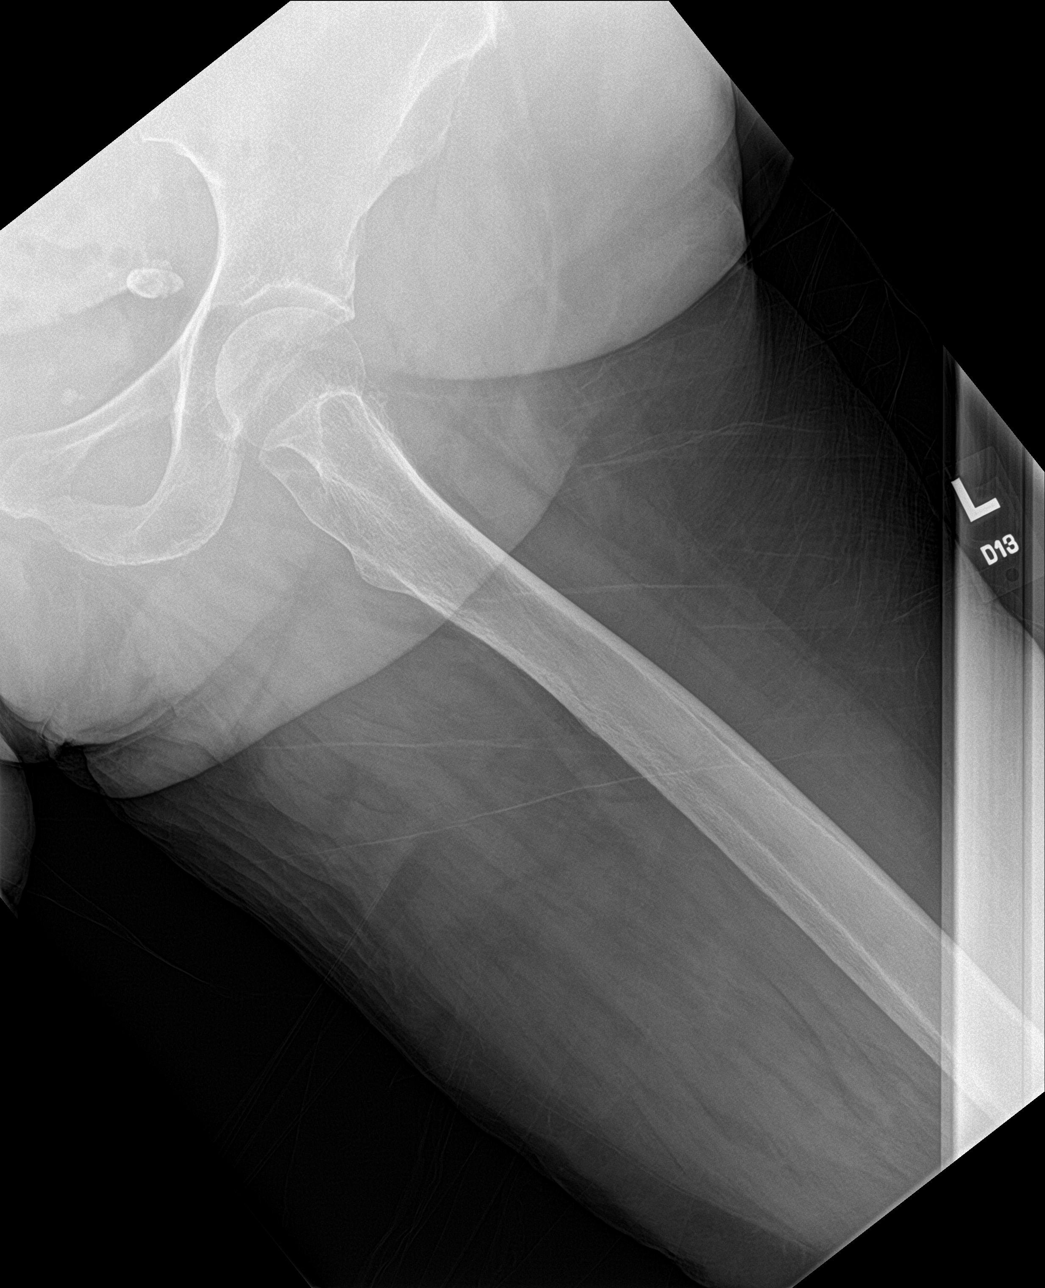

[3 of 3 positions shown; findings below may reference images not displayed]

FINDINGS: No acute fracture or hip dislocation is identified. Left hip joint
space width is preserved. Minimal right acetabular spurring is again
noted. Transitional lumbosacral anatomy and lumbar levoscoliosis are
partially visualized.
IMPRESSION: No evidence of acute osseous abnormality or significant left hip
arthropathy.

## 2022-11-21 ENCOUNTER — Inpatient Hospital Stay: Payer: PPO | Attending: Internal Medicine

## 2022-11-21 NOTE — Progress Notes (Signed)
Nutrition Assessment   Reason for Assessment:   Poor appetite, weight loss   ASSESSMENT: 89 year old female with iron deficiency anemia.  Past medical history on PUD, CKD stage III, stroke, TIA, reflux, osteoarthritis.  Patient has been receiving IV iron infusions.  Spoke with patient via phone.  Patient reports that for the past 6 months appetite has been non existent.  Had dental work done and has been unable to chew (lower plate does not fit).  Has been drinking premier protein shakes and protein bars.  Breakfast is belvita bar, added fruit recently and coffee mixed with protein drink.  Lunch is sandwich ([place] or ham with cheese).  Friend has recently been bringing her vegetable plate or today chicken, pinto beans, cooked apples.  She usually eats 1/2 for lunch and other 1/2 for dinner.  Supper if not leftovers will eat sandwich.  Has also been eating soups, oatmeal mixed with protein drink, eggs.  Son lives with patient.     Medications: Vit D, nexium, Mag ox, zofran, zoloft, ambien   Labs: Hgb 13.8, Fe 102   Anthropometrics:   Height: 62 inches Weight: 140 lb 8 oz on 12/29 136 lb 12.8 oz on 9/21 140 lb 3.2 oz on 7/11 142 lb on 4.8 oz on 01/15/22  Says that she has lost about 50 lb in the last 10 years following death of her husband BMI: 25    NUTRITION DIAGNOSIS: Inadequate oral intake related to difficulty chewing as evidenced by unable to use bottom plate for chewing, limited food options and no appetite    INTERVENTION:  Recommend 350 calorie shake or higher. Coupons mailed and information on high calorie shakes mailed Discussed ways to add calories and protein in diet. Handout mailed Discussed soft foods and option to grind foods in blender for ease of chewing.  Handout on soft moist foods provided Discussed option of appetite stimulant. Patient wants to hold off at this time.  Contact information will be mailed.  Patient will contact RD if needed in the future.     MONITORING, EVALUATION, GOAL: weight trends, intake   Next Visit: no follow-up at this time RD available as needed  Dewain Platz B. Zenia Resides, [place], [place] Registered Dietitian [phone]

## 2023-01-19 NOTE — Progress Notes (Unsigned)
PROVIDER NOTE: Information contained herein reflects review and annotations entered in association with encounter. Interpretation of such information and data should be left to medically-trained personnel. Information provided to patient can be located elsewhere in the medical record under "Patient Instructions". Document created using STT-dictation technology, any transcriptional errors that may result from process are unintentional.    Patient: Cynthia Bennett  Service Category: E/M  Provider: Gaspar Cola, MD  DOB: 12/28/1932  DOS: 01/21/2023  Referring Provider: [name], MD  MRN: [id number]  Specialty: Interventional Pain Management  PCP: Cynthia Pink, MD  Type: Established Patient  Setting: Ambulatory outpatient    Location: Office  Delivery: Face-to-face     HPI  [name], a 89 y.o. year old female, is here today because of her No primary diagnosis found.. [name] primary complain today is No chief complaint on file.  Pertinent problems: [name] has Fibromyalgia; Chronic pain syndrome; Lumbar radicular pain (Bilateral) (R>L); Chronic low back pain (1ry area of Pain) (Bilateral) (L>R) w/o sciatica; Grade 1 spondylolisthesis of L4 over L5; Lumbar discogenic pain syndrome; Lumbar facet syndrome (Bilateral) (L>R); Lumbar facet hypertrophy (Bilateral); Chronic sacroiliac joint pain (Left); Chronic hip pain (Left); Lumbar spondylosis; Abnormal MRI, lumbar spine (06/25/2016); Cervical spondylosis (Anterolisthesis of C5 over C6); Chronic lower extremity pain (2ry area of Pain) (Left); Chronic lumbar radicular pain (Left) (L5 Dermatome); Osteoarthritis of hip (Left); Chronic sacroiliac joint pain (Bilateral) (R>L); Chronic shoulder pain (Left); Osteoarthritis of shoulder (Left); Chronic sacroiliac joint pain (Right); Chronic hip pain (Right); Left upper arm pain; Myofascial pain syndrome (left upper extremity); Acromioclavicular joint pain (Left); Osteoarthritis of AC (acromioclavicular)  joint (Left); Supraspinatus tendon tear, sequela (Left); Spondylosis without myelopathy or radiculopathy, lumbosacral region; Other specified dorsopathies, sacral and sacrococcygeal region; Bertolotti's syndrome (Left); DDD (degenerative disc disease), lumbar; Severe (5-6 mm) L4-5 lumbar spinal stenosis and (8 mm) L3-4 spinal stenosis.; Osteoarthritis of hip (Right); Trigger point with back pain (Right); Chronic myofascial pain; Chronic low back pain (Right) w/o sciatica; Chronic hip pain (3ry area of Pain) (Bilateral) (R>L); Osteoarthritis of hips (Bilateral); Chronic low back pain (Left) w/o sciatica; Thoracic back pain (Left); DDD (degenerative disc disease), thoracic; DDD (degenerative disc disease), thoracolumbar; Scoliosis of thoracolumbar region due to degenerative disease of spine in adult; Weakness of left leg; and Numbness and tingling of left leg on their pertinent problem list. Pain Assessment: Severity of   is reported as a  /10. Location:    / . Onset:  . Quality:  . Timing:  . Modifying factor(s):  Marland Kitchen Vitals:  vitals were not taken for this visit.  BMI: Estimated body mass index is 25.7 kg/m as calculated from the following:   Height as of 10/28/22: '5\' 2"'$  (1.575 m).   Weight as of 11/07/22: 140 lb 8 oz (63.7 kg). Last encounter: 11/18/2022. Last procedure: 10/28/2022.  Reason for encounter:  *** . ***  Pharmacotherapy Assessment  Analgesic: Oxycodone IR 5 mg, 1 tab PO daily (maximum of 5 mg/day of oxycodone.) MME/day: 7.5 mg/day.   Monitoring: [place] PMP: PDMP reviewed during this encounter.       Pharmacotherapy: No side-effects or adverse reactions reported. Compliance: No problems identified. Effectiveness: Clinically acceptable.  No notes on file  No results found for: "CBDTHCR" No results found for: "D8THCCBX" No results found for: "D9THCCBX"  UDS:  Summary  Date Value Ref Range Status  04/28/2022 Note  Final    Comment:     ==================================================================== [facility] (MW) ==================================================================== Test  Result       Flag       Units  Drug Present and Declared for Prescription Verification   Oxycodone                      305          EXPECTED   ng/mg creat   Noroxycodone                   2439         EXPECTED   ng/mg creat    Sources of oxycodone include scheduled prescription medications.    Noroxycodone is an expected metabolite of oxycodone.  ==================================================================== Test                      Result    Flag   Units      Ref Range   Creatinine              41               mg/dL      >=20 ==================================================================== Declared Medications:  The flagging and interpretation on this report are based on the  following declared medications.  Unexpected results may arise from  inaccuracies in the declared medications.   **Note: The testing scope of this panel includes these medications:   Oxycodone   **Note: The testing scope of this panel does not include the  following reported medications:   Acetaminophen (Tylenol)  Bupropion (Wellbutrin)  Dorzolamide  Doxepin  Esomeprazole  Magnesium (Mag-Ox)  Ondansetron (Zofran)  Sertraline (Zoloft)  Timolol  Vitamin D3  Zolpidem (Ambien) ==================================================================== For clinical consultation, please call [phone]. ====================================================================       ROS  Constitutional: Denies any fever or chills Gastrointestinal: No reported hemesis, hematochezia, vomiting, or acute GI distress Musculoskeletal: Denies any acute onset joint swelling, redness, loss of ROM, or weakness Neurological: No reported episodes of acute onset apraxia, aphasia, dysarthria, agnosia, amnesia,  paralysis, loss of coordination, or loss of consciousness  Medication Review  Cholecalciferol, acetaminophen, buPROPion, dorzolamide-timolol, doxepin, esomeprazole, magnesium oxide, naloxone, ondansetron, oxyCODONE, sertraline, traZODone, and zolpidem  History Review  Allergy: Ms. [name] is allergic to ibuprofen. Drug: Ms. Broadus  reports no history of drug use. Alcohol:  reports no history of alcohol use. Tobacco:  reports that she has quit smoking. She has never used smokeless tobacco. Social: Ms. Pyland  reports that she has quit smoking. She has never used smokeless tobacco. She reports that she does not drink alcohol and does not use drugs. Medical:  has a past medical history of Dizzy spells, Fibromyalgia, Glaucoma, Hiatal hernia, Osteoarthritis, Osteoarthritis of spine with radiculopathy, lumbar region (08/14/2015), Peptic ulcer disease, Reflux, Spinal stenosis, Stroke ([place]), and TIA (transient ischemic attack). Surgical: Ms. Ton  has a past surgical history that includes Esophagogastroduodenoscopy (N/A, 04/16/2015); Appendectomy; and Abdominal hysterectomy. Family: family history includes COPD in her father; Mental illness in her mother.  Laboratory Chemistry Profile   Renal Lab Results  Component Value Date   BUN 15 11/07/2022   CREATININE 0.97 11/07/2022   BCR 11 (L) 01/10/2019   GFRAA 46 (L) 01/10/2019   GFRNONAA 57 (L) 11/07/2022    Hepatic Lab Results  Component Value Date   AST 17 07/31/2022   ALT 10 07/31/2022   ALBUMIN 3.6 07/31/2022   ALKPHOS 54 07/31/2022   LIPASE 139 04/27/2014    Electrolytes Lab Results  Component Value Date  NA 136 11/07/2022   K 4.2 11/07/2022   CL 104 11/07/2022   CALCIUM 8.9 11/07/2022   MG 2.3 01/10/2019    Bone Lab Results  Component Value Date   25OHVITD1 26 (L) 01/10/2019   25OHVITD2 1.6 01/10/2019   25OHVITD3 24 01/10/2019    Inflammation (CRP: Acute Phase) (ESR: Chronic Phase) Lab Results  Component Value Date   CRP 0.9  08/24/2020   ESRSEDRATE 51 (H) 01/10/2019         Note: Above Lab results reviewed.  Recent Imaging Review  DG PAIN CLINIC C-ARM 1-60 MIN NO REPORT Fluoro was used, but no Radiologist interpretation will be provided.  Please refer to "NOTES" tab for provider progress note. Note: Reviewed        Physical Exam  General appearance: Well nourished, well developed, and well hydrated. In no apparent acute distress Mental status: Alert, oriented x 3 (person, place, & time)       Respiratory: No evidence of acute respiratory distress Eyes: PERLA Vitals: There were no vitals taken for this visit. BMI: Estimated body mass index is 25.7 kg/m as calculated from the following:   Height as of 10/28/22: '5\' 2"'$  (1.575 m).   Weight as of 11/07/22: 140 lb 8 oz (63.7 kg). Ideal: Patient weight not recorded  Assessment   Diagnosis Status  No diagnosis found. Controlled Controlled Controlled   Updated Problems: No problems updated.  Plan of Care  Problem-specific:  No problem-specific Assessment & Plan notes found for this encounter.  Ms. JAQUAY SLIMAK has a current medication list which includes the following long-term medication(s): bupropion, doxepin, oxycodone, oxycodone, oxycodone, trazodone, and zolpidem.  Pharmacotherapy (Medications Ordered): No orders of the defined types were placed in this encounter.  Orders:  No orders of the defined types were placed in this encounter.  Follow-up plan:   No follow-ups on file.      Interventional Therapies  Risk Factors  Complex Considerations:  Advanced age   Planned  Pending:   Diagnostic/therapeutic left L4-5 and L5-S1 lumbar facet MBB    Under consideration:   Diagnostic/therapeutic left L4-5 and L5-S1 lumbar facet MBB  Possible lumbar facet RFA    Completed:   Diagnostic left L5-S1 pseudoarthrosis inj. #1 (01/09/2022) (10/10 to 0/10)  Therapeutic left L4-5 LESI x8 (06/25/2021) (100/100/80/90)  Therapeutic right L4-5 LESI  x1 (07/21/2017) (DNR-F/U)  Palliative right L5-S1 LESI x1 (09/27/2015) (100/100/90)  Therapeutic left L5-S1 LESI x1 (02/05/2016) (100/100/100/100)  Diagnostic right SI joint injection x2 (08/25/2019) (100/100/40/30)  Diagnostic left SI joint injection x2 (01/27/2019) (100/100/100/100)  Diagnostic left glenohumeral joint inj. x1 (04/29/2017) (100/100/98/98)  Left biceps muscle trigger point inj. x1 (07/21/2017) (DNR-F/U)  Right PSIS trigger point inj. x1 (07/27/2018) (100/100/50/>50)  Diagnostic right lumbar facet MBB x3 (01/09/2022) (10/10 to 0/10)  Diagnostic left lumbar facet MBB x4 (01/09/2022) (10/10 to 0/10)  Diagnostic left IA hip inj. x1 (01/27/2019) (100/100/100/100)    Completed by other providers:   None at this time   Therapeutic  Palliative (PRN) options:   None established        Recent Visits Date Type Provider Dept  11/18/22 Office Visit Milinda Pointer, MD [facility]  10/28/22 Procedure visit Milinda Pointer, MD [facility]  10/22/22 Office Visit Milinda Pointer, MD [facility]  Showing recent visits within past 90 days and meeting all other requirements Future Appointments Date Type Provider Dept  01/21/23 Appointment Milinda Pointer, MD [facility]  Showing future appointments within next  90 days and meeting all other requirements  I discussed the assessment and treatment plan with the patient. The patient was provided an opportunity to ask questions and all were answered. The patient agreed with the plan and demonstrated an understanding of the instructions.  Patient advised to call back or seek an in-person evaluation if the symptoms or condition worsens.  Duration of encounter: *** minutes.  Total time on encounter, as per AMA guidelines included both the face-to-face and non-face-to-face time personally spent by the physician and/or other qualified health care professional(s) on the day of the encounter  (includes time in activities that require the physician or other qualified health care professional and does not include time in activities normally performed by clinical staff). Physician's time may include the following activities when performed: Preparing to see the patient (e.g., pre-charting review of records, searching for previously ordered imaging, lab work, and nerve conduction tests) Review of prior analgesic pharmacotherapies. Reviewing PMP Interpreting ordered tests (e.g., lab work, imaging, nerve conduction tests) Performing post-procedure evaluations, including interpretation of diagnostic procedures Obtaining and/or reviewing separately obtained history Performing a medically appropriate examination and/or evaluation Counseling and educating the patient/family/caregiver Ordering medications, tests, or procedures Referring and communicating with other health care professionals (when not separately reported) Documenting clinical information in the electronic or other health record Independently interpreting results (not separately reported) and communicating results to the patient/ family/caregiver Care coordination (not separately reported)  Note by: Cynthia Cola, MD Date: 01/21/2023; Time: 10:23 AM

## 2023-01-21 ENCOUNTER — Ambulatory Visit: Payer: PPO | Attending: Pain Medicine | Admitting: Pain Medicine

## 2023-01-21 ENCOUNTER — Encounter: Payer: Self-pay | Admitting: Pain Medicine

## 2023-01-21 VITALS — BP 122/67 | HR 65 | Temp 97.0°F | Ht 63.0 in | Wt 140.0 lb

## 2023-01-21 DIAGNOSIS — G8929 Other chronic pain: Secondary | ICD-10-CM | POA: Diagnosis not present

## 2023-01-21 DIAGNOSIS — Z79891 Long term (current) use of opiate analgesic: Secondary | ICD-10-CM | POA: Diagnosis not present

## 2023-01-21 DIAGNOSIS — M4316 Spondylolisthesis, lumbar region: Secondary | ICD-10-CM | POA: Diagnosis not present

## 2023-01-21 DIAGNOSIS — M48062 Spinal stenosis, lumbar region with neurogenic claudication: Secondary | ICD-10-CM | POA: Insufficient documentation

## 2023-01-21 DIAGNOSIS — M5136 Other intervertebral disc degeneration, lumbar region: Secondary | ICD-10-CM | POA: Insufficient documentation

## 2023-01-21 DIAGNOSIS — M79605 Pain in left leg: Secondary | ICD-10-CM | POA: Insufficient documentation

## 2023-01-21 DIAGNOSIS — M5135 Other intervertebral disc degeneration, thoracolumbar region: Secondary | ICD-10-CM | POA: Diagnosis not present

## 2023-01-21 DIAGNOSIS — M545 Low back pain, unspecified: Secondary | ICD-10-CM | POA: Insufficient documentation

## 2023-01-21 DIAGNOSIS — G894 Chronic pain syndrome: Secondary | ICD-10-CM | POA: Diagnosis not present

## 2023-01-21 DIAGNOSIS — M4155 Other secondary scoliosis, thoracolumbar region: Secondary | ICD-10-CM | POA: Insufficient documentation

## 2023-01-21 DIAGNOSIS — Q7649 Other congenital malformations of spine, not associated with scoliosis: Secondary | ICD-10-CM | POA: Diagnosis not present

## 2023-01-21 DIAGNOSIS — M47816 Spondylosis without myelopathy or radiculopathy, lumbar region: Secondary | ICD-10-CM | POA: Diagnosis not present

## 2023-01-21 DIAGNOSIS — M25552 Pain in left hip: Secondary | ICD-10-CM | POA: Insufficient documentation

## 2023-01-21 DIAGNOSIS — M25551 Pain in right hip: Secondary | ICD-10-CM | POA: Diagnosis not present

## 2023-01-21 DIAGNOSIS — Z79899 Other long term (current) drug therapy: Secondary | ICD-10-CM | POA: Insufficient documentation

## 2023-01-21 DIAGNOSIS — M5416 Radiculopathy, lumbar region: Secondary | ICD-10-CM | POA: Diagnosis not present

## 2023-01-21 MED ORDER — OXYCODONE HCL 5 MG PO TABS: 5.0000 mg | ORAL_TABLET | Freq: Every day | ORAL | 0 refills | Status: DC | PRN

## 2023-01-21 NOTE — Progress Notes (Unsigned)
Nursing Pain Medication Assessment:  Safety precautions to be maintained throughout the outpatient stay will include: orient to surroundings, keep bed in low position, maintain call bell within reach at all times, provide assistance with transfer out of bed and ambulation.  Medication Inspection Compliance: Pill count conducted under aseptic conditions, in front of the patient. Neither the pills nor the bottle was removed from the patient's sight at any time. Once count was completed pills were immediately returned to the patient in their original bottle.  Medication: Oxycodone IR Pill/Patch Count:  18 of 30 pills remain Pill/Patch Appearance: Markings consistent with prescribed medication Bottle Appearance: Standard pharmacy container. Clearly labeled. Filled Date: 2 / 17 / 2024 Last Medication intake:  TodaySafety precautions to be maintained throughout the outpatient stay will include: orient to surroundings, keep bed in low position, maintain call bell within reach at all times, provide assistance with transfer out of bed and ambulation.

## 2023-01-21 NOTE — Patient Instructions (Signed)
____________________________________________________________________________________________  Opioid Pain Medication Update  To: All patients taking opioid pain medications. (I.e.: hydrocodone, hydromorphone, oxycodone, oxymorphone, morphine, codeine, methadone, tapentadol, tramadol, buprenorphine, fentanyl, etc.)  Re: Updated review of side effects and adverse reactions of opioid analgesics, as well as new information about long term effects of this class of medications.  Direct risks of long-term opioid therapy are not limited to opioid addiction and overdose. Potential medical risks include serious fractures, breathing problems during sleep, hyperalgesia, immunosuppression, chronic constipation, bowel obstruction, myocardial infarction, and tooth decay secondary to xerostomia.  Unpredictable adverse effects that can occur even if you take your medication correctly: Cognitive impairment, respiratory depression, and death. Most people think that if they take their medication "correctly", and "as instructed", that they will be safe. Nothing could be farther from the truth. In reality, a significant amount of recorded deaths associated with the use of opioids has occurred in individuals that had taken the medication for a long time, and were taking their medication correctly. The following are examples of how this can happen: Patient taking his/her medication for a long time, as instructed, without any side effects, is given a certain antibiotic or another unrelated medication, which in turn triggers a "Drug-to-drug interaction" leading to disorientation, cognitive impairment, impaired reflexes, respiratory depression or an untoward event leading to serious bodily harm or injury, including death.  Patient taking his/her medication for a long time, as instructed, without any side effects, develops an acute impairment of liver and/or kidney function. This will lead to a rapid inability of the body to  breakdown and eliminate their pain medication, which will result in effects similar to an "overdose", but with the same medicine and dose that they had always taken. This again may lead to disorientation, cognitive impairment, impaired reflexes, respiratory depression or an untoward event leading to serious bodily harm or injury, including death.  A similar problem will occur with patients as they grow older and their liver and kidney function begins to decrease as part of the aging process.  Background information: Historically, the original case for using long-term opioid therapy to treat chronic noncancer pain was based on safety assumptions that subsequent experience has called into question. In 1996, the American Pain Society and the [place] [employer] issued a consensus statement supporting long-term opioid therapy. This statement acknowledged the dangers of opioid prescribing but concluded that the risk for addiction was low; respiratory depression induced by opioids was short-lived, occurred mainly in opioid-naive patients, and was antagonized by pain; tolerance was not a common problem; and efforts to control diversion should not constrain opioid prescribing. This has now proven to be wrong. Experience regarding the risks for opioid addiction, misuse, and overdose in community practice has failed to support these assumptions.  According to the Centers for Disease Control and Prevention, fatal overdoses involving opioid analgesics have increased sharply over the past decade. Currently, more than 96,700 people die from drug overdoses every year. Opioids are a factor in 7 out of every 10 overdose deaths. Deaths from drug overdose have surpassed motor vehicle accidents as the leading cause of death for individuals between the ages of 57 and 13.  Clinical data suggest that neuroendocrine dysfunction may be very common in both men and women, potentially causing hypogonadism, erectile  dysfunction, infertility, decreased libido, osteoporosis, and depression. Recent studies linked higher opioid dose to increased opioid-related mortality. Controlled observational studies reported that long-term opioid therapy may be associated with increased risk for cardiovascular events. Subsequent meta-analysis concluded  that the safety of long-term opioid therapy in elderly patients has not been proven.   Side Effects and adverse reactions: Common side effects: Drowsiness (sedation). Dizziness. Nausea and vomiting. Constipation. Physical dependence -- Dependence often manifests with withdrawal symptoms when opioids are discontinued or decreased. Tolerance -- As you take repeated doses of opioids, you require increased medication to experience the same effect of pain relief. Respiratory depression -- This can occur in healthy people, especially with higher doses. However, people with COPD, asthma or other lung conditions may be even more susceptible to fatal respiratory impairment.  Uncommon side effects: An increased sensitivity to feeling pain and extreme response to pain (hyperalgesia). Chronic use of opioids can lead to this. Delayed gastric emptying (the process by which the contents of your stomach are moved into your small intestine). Muscle rigidity. Immune system and hormonal dysfunction. Quick, involuntary muscle jerks (myoclonus). Arrhythmia. Itchy skin (pruritus). Dry mouth (xerostomia).  Long-term side effects: Chronic constipation. Sleep-disordered breathing (SDB). Increased risk of bone fractures. Hypothalamic-pituitary-adrenal dysregulation. Increased risk of overdose.  RISKS: Fractures and Falls:  Opioids increase the risk and incidence of falls. This is of particular importance in elderly patients.  Endocrine System:  Long-term administration is associated with endocrine abnormalities (endocrinopathies). (Also known as Opioid-induced Endocrinopathy) Influences  on both the hypothalamic-pituitary-adrenal axis?and the hypothalamic-pituitary-gonadal axis have been demonstrated with consequent hypogonadism and adrenal insufficiency in both sexes. Hypogonadism and decreased levels of dehydroepiandrosterone sulfate have been reported in men and women. Endocrine effects include: Amenorrhoea in women (abnormal absence of menstruation) Reduced libido in both sexes Decreased sexual function Erectile dysfunction in men Hypogonadisms (decreased testicular function with shrinkage of testicles) Infertility Depression and fatigue Loss of muscle mass Anxiety Depression Immune suppression Hyperalgesia Weight gain Anemia Osteoporosis Patients (particularly women of childbearing age) should avoid opioids. There is insufficient evidence to recommend routine monitoring of asymptomatic patients taking opioids in the long-term for hormonal deficiencies.  Immune System: Human studies have demonstrated that opioids have an immunomodulating effect. These effects are mediated via opioid receptors both on immune effector cells and in the central nervous system. Opioids have been demonstrated to have adverse effects on antimicrobial response and anti-tumour surveillance. Buprenorphine has been demonstrated to have no impact on immune function.  Opioid Induced Hyperalgesia: Human studies have demonstrated that prolonged use of opioids can lead to a state of abnormal pain sensitivity, sometimes called opioid induced hyperalgesia (OIH). Opioid induced hyperalgesia is not usually seen in the absence of tolerance to opioid analgesia. Clinically, hyperalgesia may be diagnosed if the patient on long-term opioid therapy presents with increased pain. This might be qualitatively and anatomically distinct from pain related to disease progression or to breakthrough pain resulting from development of opioid tolerance. Pain associated with hyperalgesia tends to be more diffuse than the  pre-existing pain and less defined in quality. Management of opioid induced hyperalgesia requires opioid dose reduction.  Cancer: Chronic opioid therapy has been associated with an increased risk of cancer among noncancer patients with chronic pain. This association was more evident in chronic strong opioid users. Chronic opioid consumption causes significant pathological changes in the small intestine and colon. Epidemiological studies have found that there is a link between opium dependence and initiation of gastrointestinal cancers. Cancer is the second leading cause of death after cardiovascular disease. Chronic use of opioids can cause multiple conditions such as GERD, immunosuppression and renal damage as well as carcinogenic effects, which are associated with the incidence of cancers.   Mortality: Long-term opioid use  has been associated with increased mortality among patients with chronic non-cancer pain (CNCP).  Prescription of long-acting opioids for chronic noncancer pain was associated with a significantly increased risk of all-cause mortality, including deaths from causes other than overdose.  Reference: Von Korff M, Kolodny A, Deyo RA, Chou R. Long-term opioid therapy reconsidered. Ann Intern Med. 2011 Sep 6;155(5):325-8. doi: 10.7326/[id number]. PMID: [id number]; PMCIDXX:1631110. Morley Kos, Hayward RA, Dunn KM, [place] KP. Risk of adverse events in patients prescribed long-term opioids: A cohort study in the [place] Clinical Practice Research [employer]. Eur J Pain. 2019 May;23(5):908-922. doi: 10.1002/ejp.1357. Epub 2019 Jan 31. PMID: [id number]. Colameco S, Coren JS, Ciervo CA. Continuous opioid treatment for chronic noncancer pain: a time for moderation in prescribing. Postgrad Med. 2009 Jul;121(4):61-6. doi: 10.3810/pgm.2009.07.2032. PMID: [id number]. Heywood Bene RN, [place] SD, Blazina I, Rosalio Loud, Bougatsos C, Deyo RA. The  effectiveness and risks of long-term opioid therapy for chronic pain: a systematic review for a [employer] of Health Pathways to [employer]. Ann Intern Med. 2015 Feb 17;162(4):276-86. doi: [id number]. PMID: [id number]. Marjory S[facility]don [facility], [place] DM. NCHS Data Brief No. 22. [place]: Centers for Disease Control and Prevention; 2009. Sep, Increase in Fatal Poisonings Involving Opioid Analgesics in the [place], 1999-2006. Song IA, Choi HR, Oh TK. Long-term opioid use and mortality in patients with chronic non-cancer pain: Ten-year follow-up study in [place] from 2010 through 2019. EClinicalMedicine. 2022 Jul 18;51:101558. doi: 10.1016/j.eclinm.2022.UB:[id number]. PMID: [id number]; PMCIDOX:8550940. Huser, W., Schubert, T., Vogelmann, T. et al. All-cause mortality in patients with long-term opioid therapy compared with non-opioid analgesics for chronic non-cancer pain: a database study. [place] Med 18, 162 (2020). [email or web address] Rashidian H, Roxy Cedar, Malekzadeh R, Haghdoost AA. An Ecological Study of the Association between Opiate Use and Incidence of Cancers. Addict Health. 2016 Fall;8(4):252-260. PMID: [id number]; PMCIDQI:9185013.  Our Goal: Our goal is to control your pain with means other than the use of opioid pain medications.  Our Recommendation: Talk to your physician about coming off of these medications. We can assist you with the tapering down and stopping these medicines. Based on the new information, even if you cannot completely stop the medication, a decrease in the dose may be associated with a lesser risk. Ask for other means of controlling the pain. Decrease or eliminate those factors that significantly contribute to your pain such as smoking, obesity, and a diet heavily tilted towards "inflammatory" nutrients.  Last Updated: 01/07/2023    ____________________________________________________________________________________________     ____________________________________________________________________________________________  Patient Information update  To: All of our patients.  Re: Name change.  It has been made official that our current name, "[facility]"   will soon be changed to "[facility]".   The purpose of this change is to eliminate any confusion created by the concept of our practice being a "Medication Management Pain Clinic". In the past this has led to the misconception that we treat pain primarily by the use of prescription medications.  Nothing can be farther from the truth.   Understanding PAIN MANAGEMENT: To further understand what our practice does, you first have to understand that "Pain Management" is a subspecialty that requires additional training once a physician has completed their specialty training, which can be in either Anesthesia, Neurology, Psychiatry, or Physical Medicine and Rehabilitation (PMR). Each one of these contributes to the final approach taken by each physician to  the management of their patient's pain. To be a "Pain Management Specialist" you must have first completed one of the specialty trainings below.  Anesthesiologists - trained in clinical pharmacology and interventional techniques such as nerve blockade and regional as well as central neuroanatomy. They are trained to block pain before, during, and after surgical interventions.  Neurologists - trained in the diagnosis and pharmacological treatment of complex neurological conditions, such as Multiple Sclerosis, Parkinson's, spinal cord injuries, and other systemic conditions that may be associated with symptoms that may include but are not limited to pain. They tend to rely primarily on the treatment of chronic pain  using prescription medications.  Psychiatrist - trained in conditions affecting the psychosocial wellbeing of patients including but not limited to depression, anxiety, schizophrenia, personality disorders, addiction, and other substance use disorders that may be associated with chronic pain. They tend to rely primarily on the treatment of chronic pain using prescription medications.   Physical Medicine and Rehabilitation (PMR) physicians, also known as physiatrists - trained to treat a wide variety of medical conditions affecting the brain, spinal cord, nerves, bones, joints, ligaments, muscles, and tendons. Their training is primarily aimed at treating patients that have suffered injuries that have caused severe physical impairment. Their training is primarily aimed at the physical therapy and rehabilitation of those patients. They may also work alongside orthopedic surgeons or neurosurgeons using their expertise in assisting surgical patients to recover after their surgeries.  INTERVENTIONAL PAIN MANAGEMENT is sub-subspecialty of Pain Management.  Our physicians are Board-certified in Anesthesia, Pain Management, and Interventional Pain Management.  This meaning that not only have they been trained and Board-certified in their specialty of Anesthesia, and subspecialty of Pain Management, but they have also received further training in the sub-subspecialty of Interventional Pain Management, in order to become Board-certified as INTERVENTIONAL PAIN MANAGEMENT SPECIALIST.    Mission: Our goal is to use our skills in  [place] as alternatives to the chronic use of prescription opioid medications for the treatment of pain. To make this more clear, we have changed our name to reflect what we do and offer. We will continue to offer medication management assessment and recommendations, but we will not be taking over any patient's medication  management.  ____________________________________________________________________________________________     ____________________________________________________________________________________________  National Pain Medication Shortage  The [place] is experiencing worsening drug shortages. These have had a negative widespread effect on patient care and treatment. Not expected to improve any time soon. Predicted to last past 2029.   Drug shortage list (generic names) Oxycodone IR Oxycodone/APAP Oxymorphone IR Hydromorphone Hydrocodone/APAP Morphine  Where is the problem?  Manufacturing and supply level.  Will this shortage affect you?  Only if you take any of the above pain medications.  How? You may be unable to fill your prescription.  Your pharmacist may offer a "partial fill" of your prescription. (Warning: Do not accept partial fills.) Prescriptions partially filled cannot be transferred to another pharmacy. Read our Medication Rules and Regulation. Depending on how much medicine you are dependent on, you may experience withdrawals when unable to get the medication.  Recommendations: Consider ending your dependence on opioid pain medications. Ask your pain specialist to assist you with the process. Consider switching to a medication currently not in shortage, such as Buprenorphine. Talk to your pain specialist about this option. Consider decreasing your pain medication requirements by managing tolerance thru "Drug Holidays". This may help minimize withdrawals, should you run out of medicine. Control your pain thru  the use of non-pharmacological interventional therapies.   Your prescriber: Prescribers cannot be blamed for shortages. Medication manufacturing and supply issues cannot be fixed by the prescriber.   NOTE: The prescriber is not responsible for supplying the medication, or solving supply issues. Work with your pharmacist to solve it. The patient is responsible for  the decision to take or continue taking the medication and for identifying and securing a legal supply source. By law, supplying the medication is the job and responsibility of the pharmacy. The prescriber is responsible for the evaluation, monitoring, and prescribing of these medications.   Prescribers will NOT: Re-issue prescriptions that have been partially filled. Re-issue prescriptions already sent to a pharmacy.  Re-send prescriptions to a different pharmacy because yours did not have your medication. Ask pharmacist to order more medicine or transfer the prescription to another pharmacy. (Read below.)  New 2023 regulation: "July 11, 2022 Revised Regulation Allows DEA-Registered Pharmacies to Transfer Electronic Prescriptions at a Patient's Request [place] Patients now have the ability to request their electronic prescription be transferred to another pharmacy without having to go back to their practitioner to initiate the request. This revised regulation went into effect on Monday, July 07, 2022.     At a patient's request, a DEA-registered retail pharmacy can now transfer an electronic prescription for a controlled substance (schedules II-V) to another DEA-registered retail pharmacy. Prior to this change, patients would have to go through their practitioner to cancel their prescription and have it re-issued to a different pharmacy. The process was taxing and time consuming for both patients and practitioners.    The Drug Enforcement Administration [facility]) published its intent to revise the process for transferring electronic prescriptions on September 28, 2020.  The final rule was published in the federal register on June 05, 2022 and went into effect 30 days later.  Under the final rule, a prescription can only be transferred once between pharmacies, and only if allowed under existing state or other applicable law. The prescription must  remain in its electronic form; may not be altered in any way; and the transfer must be communicated directly between two licensed pharmacists. It's important to note, any authorized refills transfer with the original prescription, which means the entire prescription will be filled at the same pharmacy".  Reference: [employer]at [facility] website announcement)  [email or web address] ([place])   [employer] / Vol. 88, No. 143 / Thursday, June 05, 2022 / Rules and Regulations DEPARTMENT OF JUSTICE  Drug Enforcement Administration  21 CFR Part 1306  [Docket No. [id number]-637]  RIN [id number] Transfer of Electronic Prescriptions for Schedules II-V Controlled Substances Between Pharmacies for Initial Filling  ____________________________________________________________________________________________     ____________________________________________________________________________________________  Transfer of Pain Medication between Pharmacies  Re: 2023 DEA Clarification on existing regulation  Published on DEA Website: July 11, 2022  Title: Revised Regulation Allows DEA-Registered Pharmacies to Conservator, museum/gallery Prescriptions at a Patient's Request [place]  "Patients now have the ability to request their electronic prescription be transferred to another pharmacy without having to go back to their practitioner to initiate the request. This revised regulation went into effect on Monday, July 07, 2022.     At a patient's request, a DEA-registered retail pharmacy can now transfer an electronic prescription for a controlled substance (schedules II-V) to another DEA-registered retail pharmacy. Prior to this change, patients would have to go through their practitioner to  cancel their prescription  and have it re-issued to a different pharmacy. The process was taxing and time consuming for both patients and practitioners.    The Drug Enforcement Administration [facility]) published its intent to revise the process for transferring electronic prescriptions on September 28, 2020.  The final rule was published in the federal register on June 05, 2022 and went into effect 30 days later.  Under the final rule, a prescription can only be transferred once between pharmacies, and only if allowed under existing state or other applicable law. The prescription must remain in its electronic form; may not be altered in any way; and the transfer must be communicated directly between two licensed pharmacists. It's important to note, any authorized refills transfer with the original prescription, which means the entire prescription will be filled at the same pharmacy."    REFERENCES: 1. DEA website announcement [email or web address]  2. Department of Justice website  [email or web address].pdf  3. DEPARTMENT OF JUSTICE Drug Enforcement Administration 21 CFR Part 1306 [Docket No. [id number]-637] RIN [id number] "Transfer of Electronic Prescriptions for Schedules II-V Controlled Substances Between Pharmacies for Initial Filling"  ____________________________________________________________________________________________     _______________________________________________________________________  Medication Rules  Purpose: To inform patients, and their family members, of our medication rules and regulations.  Applies to: All patients receiving prescriptions from our practice (written or electronic).  Pharmacy of record: This is the pharmacy where your electronic prescriptions will be sent. Make sure we have the correct one.  Electronic prescriptions: In  compliance with the [place] (STOP) Act of 2017 (Session Lanny Cramp [phone]), effective November 10, 2018, all controlled substances must be electronically prescribed. Written prescriptions, faxing, or calling prescriptions to a pharmacy will no longer be done.  Prescription refills: These will be provided only during in-person appointments. No medications will be renewed without a "face-to-face" evaluation with your provider. Applies to all prescriptions.  NOTE: The following applies primarily to controlled substances (Opioid* Pain Medications).   Type of encounter (visit): For patients receiving controlled substances, face-to-face visits are required. (Not an option and not up to the patient.)  Patient's responsibilities: Pain Pills: Bring all pain pills to every appointment (except for procedure appointments). Pill Bottles: Bring pills in original pharmacy bottle. Bring bottle, even if empty. Always bring the bottle of the most recent fill.  Medication refills: You are responsible for knowing and keeping track of what medications you are taking and when is it that you will need a refill. The day before your appointment: write a list of all prescriptions that need to be refilled. The day of the appointment: give the list to the admitting nurse. Prescriptions will be written only during appointments. No prescriptions will be written on procedure days. If you forget a medication: it will not be "Called in", "Faxed", or "electronically sent". You will need to get another appointment to get these prescribed. No early refills. Do not call asking to have your prescription filled early. Partial  or short prescriptions: Occasionally your pharmacy may not have enough pills to fill your prescription.  NEVER ACCEPT a partial fill or a prescription that is short of the total amount of pills that you were prescribed.  With controlled substances the law allows 72 hours for  the pharmacy to complete the prescription.  If the prescription is not completed within 72 hours, the pharmacist will require a new prescription to be written. This means that you will be short on your medicine and we WILL NOT send another prescription to complete your original  prescription.  Instead, request the pharmacy to send a carrier to a nearby branch to get enough medication to provide you with your full prescription. Prescription Accuracy: You are responsible for carefully inspecting your prescriptions before leaving our office. Have the discharge nurse carefully go over each prescription with you, before taking them home. Make sure that your name is accurately spelled, that your address is correct. Check the name and dose of your medication to make sure it is accurate. Check the number of pills, and the written instructions to make sure they are clear and accurate. Make sure that you are given enough medication to last until your next medication refill appointment. Taking Medication: Take medication as prescribed. When it comes to controlled substances, taking less pills or less frequently than prescribed is permitted and encouraged. Never take more pills than instructed. Never take the medication more frequently than prescribed.  Inform other Doctors: Always inform, all of your healthcare providers, of all the medications you take. Pain Medication from other Providers: You are not allowed to accept any additional pain medication from any other Doctor or Healthcare provider. There are two exceptions to this rule. (see below) In the event that you require additional pain medication, you are responsible for notifying us, as stated below. Cough Medicine: Often these contain an opioid, such as codeine or hydrocodone. Never accept or take cough medicine containing these opioids if you are already taking an opioid* medication. The combination may cause respiratory failure and death. Medication Agreement:  You are responsible for carefully reading and following our Medication Agreement. This must be signed before receiving any prescriptions from our practice. Safely store a copy of your signed Agreement. Violations to the Agreement will result in no further prescriptions. (Additional copies of our Medication Agreement are available upon request.) Laws, Rules, & Regulations: All patients are expected to follow all Federal and [employer], [employer], Rules, [employer] [facility]. Ignorance of the Laws does not constitute a valid excuse.  Illegal drugs and Controlled Substances: The use of illegal substances (including, but not limited to marijuana and its derivatives) and/or the illegal use of any controlled substances is strictly prohibited. Violation of this rule may result in the immediate and permanent discontinuation of any and all prescriptions being written by our practice. The use of any illegal substances is prohibited. Adopted CDC guidelines & recommendations: Target dosing levels will be at or below 60 MME/day. Use of benzodiazepines** is not recommended.  Exceptions: There are only two exceptions to the rule of not receiving pain medications from other Healthcare Providers. Exception #1 (Emergencies): In the event of an emergency (i.e.: accident requiring emergency care), you are allowed to receive additional pain medication. However, you are responsible for: As soon as you are able, call our office [phone], at any time of the day or night, and leave a message stating your name, the date and nature of the emergency, and the name and dose of the medication prescribed. In the event that your call is answered by a member of our staff, make sure to document and save the date, time, and the name of the person that took your information.  Exception #2 (Planned Surgery): In the event that you are scheduled by another doctor or dentist to have any type of surgery or procedure, you are allowed (for a period no  longer than 30 days), to receive additional pain medication, for the acute post-op pain. However, in this case, you are responsible for picking up a copy of  our "Post-op Pain Management for Surgeons" handout, and giving it to your surgeon or dentist. This document is available at our office, and does not require an appointment to obtain it. Simply go to our office during business hours (Monday-Thursday from 8:00 AM to 4:00 PM) (Friday 8:00 AM to 12:00 Noon) or if you have a scheduled appointment with [place], prior to your surgery, and ask for it by name. In addition, you are responsible for: calling our office [phone], at any time of the day or night, and leaving a message stating your name, name of your surgeon, type of surgery, and date of procedure or surgery. Failure to comply with your responsibilities may result in termination of therapy involving the controlled substances. Medication Agreement Violation. Following the above rules, including your responsibilities will help you in avoiding a Medication Agreement Violation ("Breaking your Pain Medication Contract").  Consequences:  Not following the above rules may result in permanent discontinuation of medication prescription therapy.  *Opioid medications include: morphine, codeine, oxycodone, oxymorphone, hydrocodone, hydromorphone, meperidine, tramadol, tapentadol, buprenorphine, fentanyl, methadone. **Benzodiazepine medications include: diazepam (Valium), alprazolam (Xanax), clonazepam (Klonopine), lorazepam (Ativan), clorazepate (Tranxene), chlordiazepoxide (Librium), estazolam (Prosom), oxazepam (Serax), temazepam (Restoril), triazolam (Halcion) (Last updated: 09/02/2022) ______________________________________________________________________    ______________________________________________________________________  Medication Recommendations and Reminders  Applies to: All patients receiving prescriptions (written and/or  electronic).  Medication Rules & Regulations: You are responsible for reading, knowing, and following our "Medication Rules" document. These exist for your safety and that of others. They are not flexible and neither are we. Dismissing or ignoring them is an act of "non-compliance" that may result in complete and irreversible termination of such medication therapy. For safety reasons, "non-compliance" will not be tolerated. As with the [place] fundamental legal principle of "ignorance of the law is no defense", we will accept no excuses for not having read and knowing the content of documents provided to you by our practice.  Pharmacy of record:  Definition: This is the pharmacy where your electronic prescriptions will be sent.  We do not endorse any particular pharmacy. It is up to you and your insurance to decide what pharmacy to use.  We do not restrict you in your choice of pharmacy. However, once we write for your prescriptions, we will NOT be re-sending more prescriptions to fix restricted supply problems created by your pharmacy, or your insurance.  The pharmacy listed in the electronic medical record should be the one where you want electronic prescriptions to be sent. If you choose to change pharmacy, simply notify our nursing staff. Changes will be made only during your regular appointments and not over the phone.  Recommendations: Keep all of your pain medications in a safe place, under lock and key, even if you live alone. We will NOT replace lost, stolen, or damaged medication. We do not accept "Police Reports" as proof of medications having been stolen. After you fill your prescription, take 1 week's worth of pills and put them away in a safe place. You should keep a separate, properly labeled bottle for this purpose. The remainder should be kept in the original bottle. Use this as your primary supply, until it runs out. Once it's gone, then you know that you have 1 week's worth of medicine,  and it is time to come in for a prescription refill. If you do this correctly, it is unlikely that you will ever run out of medicine. To make sure that the above recommendation works, it is very important that you make  sure your medication refill appointments are scheduled at least 1 week before you run out of medicine. To do this in an effective manner, make sure that you do not leave the office without scheduling your next medication management appointment. Always ask the nursing staff to show you in your prescription , when your medication will be running out. Then arrange for the receptionist to get you a return appointment, at least 7 days before you run out of medicine. Do not wait until you have 1 or 2 pills left, to come in. This is very poor planning and does not take into consideration that we may need to cancel appointments due to bad weather, sickness, or emergencies affecting our staff. DO NOT ACCEPT A "Partial Fill": If for any reason your pharmacy does not have enough pills/tablets to completely fill or refill your prescription, do not allow for a "partial fill". The law allows the pharmacy to complete that prescription within 72 hours, without requiring a new prescription. If they do not fill the rest of your prescription within those 72 hours, you will need a separate prescription to fill the remaining amount, which we will NOT provide. If the reason for the partial fill is your insurance, you will need to talk to the pharmacist about payment alternatives for the remaining tablets, but again, DO NOT ACCEPT A PARTIAL FILL, unless you can trust your pharmacist to obtain the remainder of the pills within 72 hours.  Prescription refills and/or changes in medication(s):  Prescription refills, and/or changes in dose or medication, will be conducted only during scheduled medication management appointments. (Applies to both, written and electronic prescriptions.) No refills on procedure days. No  medication will be changed or started on procedure days. No changes, adjustments, and/or refills will be conducted on a procedure day. Doing so will interfere with the diagnostic portion of the procedure. No phone refills. No medications will be "called into the pharmacy". No Fax refills. No weekend refills. No Holliday refills. No after hours refills.  Remember:  Business hours are:  Monday to Thursday 8:00 AM to 4:00 PM Provider's Schedule: Milinda Pointer, MD - Appointments are:  Medication management: Monday and Wednesday 8:00 AM to 4:00 PM Procedure day: Tuesday and Thursday 7:30 AM to 4:00 PM Gillis Santa, MD - Appointments are:  Medication management: Tuesday and Thursday 8:00 AM to 4:00 PM Procedure day: Monday and Wednesday 7:30 AM to 4:00 PM (Last update: 09/02/2022) ______________________________________________________________________    ____________________________________________________________________________________________  Drug Holidays  What is a "Drug Holiday"? Drug Holiday: is the name given to the process of slowly tapering down and temporarily stopping the pain medication for the purpose of decreasing or eliminating tolerance to the drug.  Benefits Improved effectiveness Decreased required effective dose Improved pain control End dependence on high dose therapy Decrease cost of therapy Uncovering "opioid-induced hyperalgesia". (OIH)  What is "opioid hyperalgesia"? It is a paradoxical increase in pain caused by exposure to opioids. Stopping the opioid pain medication, contrary to the expected, it actually decreases or completely eliminates the pain. Ref.: "A comprehensive review of opioid-induced hyperalgesia". Brion Aliment, et.al. Pain Physician. 2011 Mar-Apr;14(2):145-61.  What is tolerance? Tolerance: the progressive loss of effectiveness of a pain medicine due to repetitive use. A common problem of opioid pain medications.  How long should a "Drug  Holiday" last? Effectiveness depends on the patient staying off all opioid pain medicines for a minimum of 14 consecutive days. (2 weeks)  How about just taking less of the medicine? Does not  work. Will not accomplish goal of eliminating the excess receptors.  How about switching to a different pain medicine? (AKA. "Opioid rotation") Does not work. Creates the illusion of effectiveness by taking advantage of inaccurate equivalent dose calculations between different opioids. -This "technique" was promoted by studies funded by [employer], such as [employer], creators of "OxyContin".  Can I stop the medicine "cold [place]"? Depends. You should always coordinate with your Pain Specialist to make the transition as smoothly as possible. Avoid stopping the medicine abruptly without consulting. We recommend a "slow taper".  What is a slow taper? Taper: refers to the gradual decrease in dose.   How do I stop/taper the dose? Slowly. Decrease the daily amount of pills that you take by one (1) pill every seven (7) days. This is called a "slow downward taper". Example: if you normally take four (4) pills per day, drop it to three (3) pills per day for seven (7) days, then to two (2) pills per day for seven (7) days, then to one (1) per day for seven (7) days, and then stop the medicine. The 14 day "Drug Holiday" starts on the first day without medicine.   Will I experience withdrawals? Unlikely with a slow taper.  What triggers withdrawals? Withdrawals are triggered by the sudden/abrupt stop of high dose opioids. Withdrawals can be avoided by slowly decreasing the dose over a prolonged period of time.  What are withdrawals? Symptoms associated with sudden/abrupt reduction/stopping of high-dose, long-term use of pain medication. Withdrawal are seldom seen on low dose therapy, or patients rarely taking opioid medication.  Early Withdrawal Symptoms may include: Agitation Anxiety Muscle  aches Increased tearing Insomnia Runny nose Sweating Yawning  Late symptoms may include: Abdominal cramping Diarrhea Dilated pupils Goose bumps Nausea Vomiting  (Last update: 10/19/2022) ____________________________________________________________________________________________    ____________________________________________________________________________________________  WARNING: CBD (cannabidiol) & Delta (Delta-8 tetrahydrocannabinol) products.   Applicable to:  All individuals currently taking or considering taking CBD (cannabidiol) and, more important, all patients taking opioid analgesic controlled substances (pain medication). (Example: oxycodone; oxymorphone; hydrocodone; hydromorphone; morphine; methadone; tramadol; tapentadol; fentanyl; buprenorphine; butorphanol; dextromethorphan; meperidine; codeine; etc.)  Introduction:  Recently there has been a drive towards the use of "natural" products for the treatment of different conditions, including pain anxiety and sleep disorders. Marijuana and hemp are two varieties of the cannabis genus plants. Marijuana and its derivatives are illegal, while hemp and its derivatives are not. Cannabidiol (CBD) and tetrahydrocannabinol (THC), are two natural compounds found in plants of the Cannabis genus. They can both be extracted from hemp or marijuana. Both compounds interact with your body's endocannabinoid system in very different ways. CBD is associated with pain relief (analgesia) while THC is associated with the psychoactive effects ("the high") obtained from the use of marijuana products. There are two main types of THC: Delta-9, which comes from the marijuana plant and it is illegal, and Delta-8, which comes from the hemp plant, and it is legal. (Both, Delta-9-THC and Delta-8-THC are psychoactive and give you "the high".)   Legality:  Marijuana and its derivatives: illegal Hemp and its derivatives: Legal (State dependent) UPDATE:  (12/27/2021) The Drug Enforcement Agency ([place]) issued a letter stating that "delta" cannabinoids, including Delta-8-THCO and Delta-9-THCO, synthetically derived from hemp do not qualify as hemp and will be viewed as Schedule I drugs. (Schedule I drugs, substances, or chemicals are defined as drugs with no currently accepted medical use and a high potential for abuse. Some examples of Schedule I drugs are: heroin,  lysergic acid diethylamide (LSD), marijuana (cannabis), 3,4-methylenedioxymethamphetamine (ecstasy), methaqualone, and peyote.) (https://jennings.com/)  Legal status of CBD in [place]:  "Conditionally Legal"  Reference: "FDA Regulation of Cannabis and Cannabis-Derived Products, Including Cannabidiol (CBD)" - [employer].pt  Warning:  CBD is not FDA approved and has not undergo the same manufacturing controls as prescription drugs.  This means that the purity and safety of available CBD may be questionable. Most of the time, despite manufacturer's claims, it is contaminated with THC (delta-9-tetrahydrocannabinol - the chemical in marijuana responsible for the "HIGH").  When this is the case, the [facility] contaminant will trigger a positive urine drug screen (UDS) test for Marijuana (carboxy-THC).   The FDA recently put out a warning about 5 things that everyone should be aware of regarding Delta-8 THC: Delta-8 THC products have not been evaluated or approved by the FDA for safe use and may be marketed in ways that put the public health at risk. The FDA has received adverse event reports involving delta-8 THC-containing products. Delta-8 THC has psychoactive and intoxicating effects. Delta-8 THC manufacturing often involve use of potentially harmful chemicals to create the concentrations of delta-8 THC claimed in the marketplace. The final delta-8 THC product may have potentially harmful  by-products (contaminants) due to the chemicals used in the process. Manufacturing of delta-8 THC products may occur in uncontrolled or unsanitary settings, which may lead to the presence of unsafe contaminants or other potentially harmful substances. Delta-8 THC products should be kept out of the reach of children and pets.  NOTE: Because a positive UDS for any illicit substance is a violation of our medication agreement, your opioid analgesics (pain medicine) may be permanently discontinued.  MORE ABOUT CBD  General Information: CBD was discovered in [birth year] and it is a derivative of the cannabis sativa genus plants (Marijuana and Hemp). It is one of the 113 identified substances found in Marijuana. It accounts for up to 40% of the plant's extract. As of 2018, preliminary clinical studies on CBD included research for the treatment of anxiety, movement disorders, and pain. CBD is available and consumed in multiple forms, including inhalation of smoke or vapor, as an aerosol spray, and by mouth. It may be supplied as an oil containing CBD, capsules, dried cannabis, or as a liquid solution. CBD is thought not to be as psychoactive as THC (delta-9-tetrahydrocannabinol - the chemical in marijuana responsible for the "HIGH"). Studies suggest that CBD may interact with different biological target receptors in the body, including cannabinoid and other neurotransmitter receptors. As of 2018 the mechanism of action for its biological effects has not been determined.  Side-effects  Adverse reactions: Dry mouth, diarrhea, decreased appetite, fatigue, drowsiness, malaise, weakness, sleep disturbances, and others.  Drug interactions:  CBD may interact with medications such as blood-thinners. CBD causes drowsiness on its own and it will increase drowsiness caused by other medications, including antihistamines (such as Benadryl), benzodiazepines (Xanax, Ativan, Valium), antipsychotics, antidepressants, opioids, alcohol  and supplements such as kava, melatonin and St. John's Wort.  Other drug interactions: Brivaracetam (Briviact); Caffeine; Carbamazepine (Tegretol); Citalopram (Celexa); Clobazam (Onfi); Eslicarbazepine (Aptiom); Everolimus (Zostress); Lithium; Methadone (Dolophine); Rufinamide (Banzel); Sedative medications (CNS depressants); Sirolimus (Rapamune); Stiripentol (Diacomit); Tacrolimus (Prograf); Tamoxifen ; Soltamox); Topiramate (Topamax); Valproate; Warfarin (Coumadin); Zonisamide. (Last update: 10/20/2022) ____________________________________________________________________________________________   ____________________________________________________________________________________________  Naloxone Nasal Spray  Why am I receiving this medication? [place] STOP ACT requires that all patients taking high dose opioids or at risk of opioids respiratory depression, be prescribed an opioid reversal agent, such as  Naloxone (AKA: Narcan).  What is this medication? NALOXONE (nal OX one) treats opioid overdose, which causes slow or shallow breathing, severe drowsiness, or trouble staying awake. Call emergency services after using this medication. You may need additional treatment. Naloxone works by reversing the effects of opioids. It belongs to a group of medications called opioid blockers.  COMMON BRAND NAME(S): Kloxxado, Narcan  What should I tell my care team before I take this medication? They need to know if you have any of these conditions: Heart disease Substance use disorder An unusual or allergic reaction to naloxone, other medications, foods, dyes, or preservatives Pregnant or trying to get pregnant Breast-feeding  When to use this medication? This medication is to be used for the treatment of respiratory depression (less than 8 breaths per minute) secondary to opioid overdose.   How to use this medication? This medication is for use in the nose. Lay the person on their back.  Support their neck with your hand and allow the head to tilt back before giving the medication. The nasal spray should be given into 1 nostril. After giving the medication, move the person onto their side. Do not remove or test the nasal spray until ready to use. Get emergency medical help right away after giving the first dose of this medication, even if the person wakes up. You should be familiar with how to recognize the signs and symptoms of a narcotic overdose. If more doses are needed, give the additional dose in the other nostril. Talk to your care team about the use of this medication in children. While this medication may be prescribed for children as young as newborns for selected conditions, precautions do apply.  Naloxone Overdosage: If you think you have taken too much of this medicine contact a poison control center or emergency room at once.  NOTE: This medicine is only for you. Do not share this medicine with others.  What if I miss a dose? This does not apply.  What may interact with this medication? This is only used during an emergency. No interactions are expected during emergency use. This list may not describe all possible interactions. Give your health care provider a list of all the medicines, herbs, non-prescription drugs, or dietary supplements you use. Also tell them if you smoke, drink alcohol, or use illegal drugs. Some items may interact with your medicine.  What should I watch for while using this medication? Keep this medication ready for use in the case of an opioid overdose. Make sure that you have the phone number of your care team and local hospital ready. You may need to have additional doses of this medication. Each nasal spray contains a single dose. Some emergencies may require additional doses. After use, bring the treated person to the nearest hospital or call 911. Make sure the treating care team knows that the person has received a dose of this medication.  You will receive additional instructions on what to do during and after use of this medication before an emergency occurs.  What side effects may I notice from receiving this medication? Side effects that you should report to your care team as soon as possible: Allergic reactions--skin rash, itching, hives, swelling of the face, lips, tongue, or throat Side effects that usually do not require medical attention (report these to your care team if they continue or are bothersome): Constipation Dryness or irritation inside the nose Headache Increase in blood pressure Muscle spasms Stuffy nose Toothache This list may not  describe all possible side effects. Call your doctor for medical advice about side effects. You may report side effects to FDA at 1-800-FDA-1088.  Where should I keep my medication? Because this is an emergency medication, you should keep it with you at all times.  Keep out of the reach of children and pets. Store between 20 and 25 degrees C (68 and 77 degrees F). Do not freeze. Throw away any unused medication after the expiration date. Keep in original box until ready to use.  NOTE: This sheet is a summary. It may not cover all possible information. If you have questions about this medicine, talk to your doctor, pharmacist, or health care provider.   2023 Elsevier/Gold Standard (2021-07-05 00:00:00)  ____________________________________________________________________________________________

## 2023-03-13 ENCOUNTER — Encounter: Payer: Self-pay | Admitting: Internal Medicine

## 2023-03-13 ENCOUNTER — Inpatient Hospital Stay: Payer: PPO

## 2023-03-13 ENCOUNTER — Inpatient Hospital Stay: Payer: PPO | Attending: Internal Medicine

## 2023-03-13 ENCOUNTER — Inpatient Hospital Stay (HOSPITAL_BASED_OUTPATIENT_CLINIC_OR_DEPARTMENT_OTHER): Payer: PPO | Admitting: Internal Medicine

## 2023-03-13 VITALS — BP 111/62 | HR 64

## 2023-03-13 VITALS — BP 118/62 | HR 70 | Temp 98.9°F | Ht 63.0 in | Wt 145.0 lb

## 2023-03-13 DIAGNOSIS — Z825 Family history of asthma and other chronic lower respiratory diseases: Secondary | ICD-10-CM | POA: Insufficient documentation

## 2023-03-13 DIAGNOSIS — M2550 Pain in unspecified joint: Secondary | ICD-10-CM | POA: Diagnosis not present

## 2023-03-13 DIAGNOSIS — R5383 Other fatigue: Secondary | ICD-10-CM | POA: Diagnosis not present

## 2023-03-13 DIAGNOSIS — G8929 Other chronic pain: Secondary | ICD-10-CM | POA: Insufficient documentation

## 2023-03-13 DIAGNOSIS — Z8673 Personal history of transient ischemic attack (TIA), and cerebral infarction without residual deficits: Secondary | ICD-10-CM | POA: Insufficient documentation

## 2023-03-13 DIAGNOSIS — M549 Dorsalgia, unspecified: Secondary | ICD-10-CM | POA: Insufficient documentation

## 2023-03-13 DIAGNOSIS — D631 Anemia in chronic kidney disease: Secondary | ICD-10-CM | POA: Insufficient documentation

## 2023-03-13 DIAGNOSIS — Z79899 Other long term (current) drug therapy: Secondary | ICD-10-CM | POA: Insufficient documentation

## 2023-03-13 DIAGNOSIS — R635 Abnormal weight gain: Secondary | ICD-10-CM | POA: Insufficient documentation

## 2023-03-13 DIAGNOSIS — N1832 Chronic kidney disease, stage 3b: Secondary | ICD-10-CM | POA: Insufficient documentation

## 2023-03-13 DIAGNOSIS — Z818 Family history of other mental and behavioral disorders: Secondary | ICD-10-CM | POA: Insufficient documentation

## 2023-03-13 DIAGNOSIS — Z87891 Personal history of nicotine dependence: Secondary | ICD-10-CM | POA: Insufficient documentation

## 2023-03-13 DIAGNOSIS — Z9049 Acquired absence of other specified parts of digestive tract: Secondary | ICD-10-CM | POA: Diagnosis not present

## 2023-03-13 DIAGNOSIS — Z886 Allergy status to analgesic agent status: Secondary | ICD-10-CM | POA: Insufficient documentation

## 2023-03-13 DIAGNOSIS — Z90710 Acquired absence of both cervix and uterus: Secondary | ICD-10-CM | POA: Diagnosis not present

## 2023-03-13 DIAGNOSIS — E611 Iron deficiency: Secondary | ICD-10-CM

## 2023-03-13 LAB — BASIC METABOLIC PANEL
Anion gap: 8 (ref 5–15)
BUN: 14 mg/dL (ref 8–23)
CO2: 23 mmol/L (ref 22–32)
Calcium: 9 mg/dL (ref 8.9–10.3)
Chloride: 106 mmol/L (ref 98–111)
Creatinine, Ser: 0.97 mg/dL (ref 0.44–1.00)
GFR, Estimated: 57 mL/min — ABNORMAL LOW (ref >60)
Glucose, Bld: 125 mg/dL — ABNORMAL HIGH (ref 70–99)
Potassium: 4.1 mmol/L (ref 3.5–5.1)
Sodium: 137 mmol/L (ref 135–145)

## 2023-03-13 LAB — IRON AND TIBC
Iron: 15 ug/dL — ABNORMAL LOW (ref 28–170)
Saturation Ratios: 5 % — ABNORMAL LOW (ref 10.4–31.8)
TIBC: 291 ug/dL (ref 250–450)
UIBC: 276 ug/dL

## 2023-03-13 LAB — FERRITIN: Ferritin: 33 ng/mL (ref 11–307)

## 2023-03-13 LAB — CBC WITH DIFFERENTIAL/PLATELET
Abs Immature Granulocytes: 0.04 10*3/uL (ref 0.00–0.07)
Basophils Absolute: 0.1 10*3/uL (ref 0.0–0.1)
Basophils Relative: 1 %
Eosinophils Absolute: 0.4 10*3/uL (ref 0.0–0.5)
Eosinophils Relative: 5 %
HCT: 35.1 % — ABNORMAL LOW (ref 36.0–46.0)
Hemoglobin: 11 g/dL — ABNORMAL LOW (ref 12.0–15.0)
Immature Granulocytes: 1 %
Lymphocytes Relative: 23 %
Lymphs Abs: 2 10*3/uL (ref 0.7–4.0)
MCH: 27.8 pg (ref 26.0–34.0)
MCHC: 31.3 g/dL (ref 30.0–36.0)
MCV: 88.9 fL (ref 80.0–100.0)
Monocytes Absolute: 0.6 10*3/uL (ref 0.1–1.0)
Monocytes Relative: 7 %
Neutro Abs: 5.6 10*3/uL (ref 1.7–7.7)
Neutrophils Relative %: 63 %
Platelets: 381 10*3/uL (ref 150–400)
RBC: 3.95 MIL/uL (ref 3.87–5.11)
RDW: 12.4 % (ref 11.5–15.5)
WBC: 8.8 10*3/uL (ref 4.0–10.5)
nRBC: 0 % (ref 0.0–0.2)

## 2023-03-13 MED ORDER — SODIUM CHLORIDE 0.9 % IV SOLN
Freq: Once | INTRAVENOUS | Status: AC
  Filled 2023-03-13: qty 250

## 2023-03-13 MED ORDER — SODIUM CHLORIDE 0.9 % IV SOLN
200.0000 mg | Freq: Once | INTRAVENOUS | Status: AC
  Administered 2023-03-13: 200 mg via INTRAVENOUS
  Filled 2023-03-13: qty 200

## 2023-03-13 NOTE — Patient Instructions (Signed)

## 2023-03-13 NOTE — Progress Notes (Signed)
Declined 30 minute post-observation. Vitals stable at discharge.  

## 2023-03-13 NOTE — Progress Notes (Signed)
Fatigue/weakness: no Dyspena: no Light headedness: occasionally/uses a walker Blood in stool: no   Had dental surgery 03/10/23.

## 2023-03-13 NOTE — Assessment & Plan Note (Addendum)
#   Anemia iron def/CKD stageIII : Hb-8-9 [MCV- 77] s/p IV iron infusion.    # hemoglobin to 11.8- proceed with Venofer today  # Etiology: Unclear etiology.  GI versus CKD.  colo > 10 years ago; patient reluctant with GI procedures in the past. Recommended CT CAP; declines imaging.    # CKD stage III-[GFR 57]- STABLE   # Constipation: ? Narcotics-defer to PCP/pain clinic- stable  # Chronic back pain/? scaitica [Dr.Neivera]- stable  # weight loss- s/p dental extraction. S/p referral to nutrition- improved.   # DISPOSITION:  # proceed venofer  # follow up in 4 months- MD; ; labs- cbc/bmp/iron studies/ferritin-- possible IVvenofer- Dr.B  Cc; Dr.Hedrick

## 2023-03-13 NOTE — Progress Notes (Signed)
[facility] CONSULT NOTE  Patient Care Team: Jerl Mina, MD as PCP - General (Family Medicine) Earna Coder, MD as Consulting Physician (Hematology and Oncology)  CHIEF COMPLAINTS/PURPOSE OF CONSULTATION: Anemia   HEMATOLOGY HISTORY  # CHRONIC NORMOCYTIC ANEMIA [since 2019-PCP- Hb 8-9; Iron sat- 5%;NO Ferritin; GFR- 43]EGD/colonoscopy-?2013-2104- pt declines procedures; Intolerance to p.o. iron. IV Venofer-  #CKD stage III/chronic pain back-pain clinic narcotics.  HISTORY OF PRESENTING ILLNESS: In a wheelchair.  Accompanied by family.   Cynthia Bennett 89 y.o.  female history of anemia secondary ?chronic kidney disease vs others is here for follow-up.  No falls. Gaining weight.  Improved  fatigue.  Denies any blood in stools or black or stools.  Review of Systems  Constitutional:  Positive for malaise/fatigue. Negative for chills, diaphoresis and fever.  HENT:  Negative for nosebleeds and sore throat.   Eyes:  Negative for double vision.  Respiratory:  Negative for cough, hemoptysis, sputum production, shortness of breath and wheezing.   Cardiovascular:  Negative for chest pain, palpitations, orthopnea and leg swelling.  Gastrointestinal:  Negative for abdominal pain, blood in stool, constipation, diarrhea, heartburn, melena, nausea and vomiting.  Genitourinary:  Negative for dysuria, frequency and urgency.  Musculoskeletal:  Positive for back pain and joint pain.  Skin: Negative.  Negative for itching and rash.  Neurological:  Negative for dizziness, tingling, focal weakness, weakness and headaches.  Endo/Heme/Allergies:  Does not bruise/bleed easily.  Psychiatric/Behavioral:  Negative for depression. The patient is not nervous/anxious and does not have insomnia.     MEDICAL HISTORY:  Past Medical History:  Diagnosis Date   Dizzy spells    Fibromyalgia    Glaucoma    Hiatal hernia    Osteoarthritis    Osteoarthritis of spine with radiculopathy,  lumbar region 08/14/2015   Peptic ulcer disease    Reflux    Spinal stenosis    Stroke (HCC)    TIA (transient ischemic attack)     SURGICAL HISTORY: Past Surgical History:  Procedure Laterality Date   ABDOMINAL HYSTERECTOMY     APPENDECTOMY     ESOPHAGOGASTRODUODENOSCOPY N/A 04/16/2015   Procedure: ESOPHAGOGASTRODUODENOSCOPY (EGD);  Surgeon: Wallace Cullens, MD;  Location: [facility] ENDOSCOPY;  Service: Gastroenterology;  Laterality: N/A;    SOCIAL HISTORY: Social History   Socioeconomic History   Marital status: Widowed    Spouse name: Not on file   Number of children: Not on file   Years of education: Not on file   Highest education level: Not on file  Occupational History   Not on file  Tobacco Use   Smoking status: Former   Smokeless tobacco: Never  Vaping Use   Vaping Use: Never used  Substance and Sexual Activity   Alcohol use: No    Alcohol/week: 0.0 standard drinks of alcohol   Drug use: No   Sexual activity: Not on file  Other Topics Concern   Not on file  Social History Narrative   [place] lives in [place]; with son. Quit smoking in 1957. No alcohol. Designed clothes/ alterations.    Social Determinants of Health   Financial Resource Strain: Not on file  Food Insecurity: Not on file  Transportation Needs: Not on file  Physical Activity: Not on file  Stress: Not on file  Social Connections: Not on file  Intimate Partner Violence: Not on file    FAMILY HISTORY: Family History  Problem Relation Age of Onset   Mental illness Mother    COPD Father  ALLERGIES:  is allergic to ibuprofen.  MEDICATIONS:  Current Outpatient Medications  Medication Sig Dispense Refill   acetaminophen (TYLENOL) 325 MG tablet Take 650 mg by mouth every 6 (six) hours as needed.     Cholecalciferol (VITAMIN D3) 20 MCG (800 UNIT) TABS Take by mouth 2 (two) times daily.     dorzolamide-timolol (COSOPT) 22.3-6.8 MG/ML ophthalmic solution Place 1 drop into both eyes in the morning and at  bedtime.     doxepin (SINEQUAN) 100 MG capsule Take 100 mg by mouth at bedtime.     esomeprazole (NEXIUM) 40 MG capsule Take 40 mg by mouth 2 (two) times daily.     magnesium oxide (MAG-OX) 400 MG tablet Take by mouth.     naloxone (NARCAN) nasal spray 4 mg/0.1 mL Place 1 spray into the nose as needed for up to 365 doses (for opioid-induced respiratory depresssion). In case of emergency (overdose), spray once into each nostril. If no response within 3 minutes, repeat application and call 911. 1 each 0   ondansetron (ZOFRAN) 8 MG tablet TAKE 1 TABLET BY MOUTH EVERY 8 HOURS AS NEEDED FOR NAUSEA AND VOMITING 45 tablet 1   oxyCODONE (OXY IR/ROXICODONE) 5 MG immediate release tablet Take 1 tablet (5 mg total) by mouth daily as needed for severe pain. Must last 30 days 30 tablet 0   [START ON 03/27/2023] oxyCODONE (OXY IR/ROXICODONE) 5 MG immediate release tablet Take 1 tablet (5 mg total) by mouth daily as needed for severe pain. Must last 30 days 30 tablet 0   sertraline (ZOLOFT) 100 MG tablet Take 100 mg by mouth daily.     traZODone (DESYREL) 50 MG tablet Take 50 mg by mouth at bedtime as needed.     zolpidem (AMBIEN) 10 MG tablet TAKE 1/2 TO 1 TABLET BY MOUTH EVERY NIGHT AT BEDTIME     oxyCODONE (OXY IR/ROXICODONE) 5 MG immediate release tablet Take 1 tablet (5 mg total) by mouth daily as needed for severe pain. Must last 30 days 30 tablet 0   No current facility-administered medications for this visit.      PHYSICAL EXAMINATION:   Vitals:   03/13/23 1246  BP: 118/62  Pulse: 70  Temp: 98.9 F (37.2 C)  SpO2: 98%    Filed Weights   03/13/23 1246  Weight: 145 lb (65.8 kg)     Physical Exam HENT:     Head: Normocephalic and atraumatic.     Mouth/Throat:     Pharynx: No oropharyngeal exudate.  Eyes:     Pupils: Pupils are equal, round, and reactive to light.  Cardiovascular:     Rate and Rhythm: Normal rate and regular rhythm.  Pulmonary:     Effort: Pulmonary effort is  normal. No respiratory distress.     Breath sounds: Normal breath sounds. No wheezing.  Abdominal:     General: Bowel sounds are normal. There is no distension.     Palpations: Abdomen is soft. There is no mass.     Tenderness: There is no abdominal tenderness. There is no guarding or rebound.  Musculoskeletal:        General: No tenderness. Normal range of motion.     Cervical back: Normal range of motion and neck supple.  Skin:    General: Skin is warm.  Neurological:     Mental Status: She is alert and oriented to person, place, and time.  Psychiatric:        Mood and Affect: Affect normal.  LABORATORY DATA:  I have reviewed the data as listed Lab Results  Component Value Date   WBC 8.8 03/13/2023   HGB 11.0 (L) 03/13/2023   HCT 35.1 (L) 03/13/2023   MCV 88.9 03/13/2023   PLT 381 03/13/2023   Recent Labs    07/31/22 1421 11/07/22 1249 03/13/23 1245  NA 136 136 137  K 3.7 4.2 4.1  CL 108 104 106  CO2 24 27 23   GLUCOSE 85 79 125*  BUN 15 15 14   CREATININE 1.00 0.97 0.97  CALCIUM 8.6* 8.9 9.0  GFRNONAA 55* 57* 57*  PROT 6.7  --   --   ALBUMIN 3.6  --   --   AST 17  --   --   ALT 10  --   --   ALKPHOS 54  --   --   BILITOT 0.3  --   --      No results found.  Anemia due to stage 3b chronic kidney disease (HCC) # Anemia iron def/CKD stageIII : Hb-8-9 [MCV- 77] s/p IV iron infusion.    # hemoglobin to 11.8- proceed with Venofer today  # Etiology: Unclear etiology.  GI versus CKD.  colo > 10 years ago; patient reluctant with GI procedures in the past. Recommended CT CAP; declines imaging.    # CKD stage III-[GFR 57]- STABLE   # Constipation: ? Narcotics-defer to PCP/pain clinic- stable  # Chronic back pain/? scaitica [Dr.Neivera]- stable  # weight loss- s/p dental extraction. S/p referral to nutrition- improved.   # DISPOSITION:  # proceed venofer  # follow up in 4 months- MD; ; labs- cbc/bmp/iron studies/ferritin-- possible IVvenofer- Dr.B  Cc;  Dr.Hedrick   All questions were answered. The patient knows to call the clinic with any problems, questions or concerns.      Earna Coder, MD 03/13/2023 1:30 PM

## 2023-03-30 DIAGNOSIS — M797 Fibromyalgia: Secondary | ICD-10-CM | POA: Diagnosis not present

## 2023-03-30 DIAGNOSIS — N1832 Chronic kidney disease, stage 3b: Secondary | ICD-10-CM | POA: Diagnosis not present

## 2023-03-30 DIAGNOSIS — E785 Hyperlipidemia, unspecified: Secondary | ICD-10-CM | POA: Diagnosis not present

## 2023-03-30 DIAGNOSIS — E538 Deficiency of other specified B group vitamins: Secondary | ICD-10-CM | POA: Diagnosis not present

## 2023-03-30 DIAGNOSIS — N3941 Urge incontinence: Secondary | ICD-10-CM | POA: Diagnosis not present

## 2023-03-30 DIAGNOSIS — Z0000 Encounter for general adult medical examination without abnormal findings: Secondary | ICD-10-CM | POA: Diagnosis not present

## 2023-03-30 DIAGNOSIS — G4700 Insomnia, unspecified: Secondary | ICD-10-CM | POA: Diagnosis not present

## 2023-03-30 DIAGNOSIS — D631 Anemia in chronic kidney disease: Secondary | ICD-10-CM | POA: Diagnosis not present

## 2023-03-30 DIAGNOSIS — F1120 Opioid dependence, uncomplicated: Secondary | ICD-10-CM | POA: Diagnosis not present

## 2023-04-01 ENCOUNTER — Telehealth: Payer: Self-pay

## 2023-04-01 NOTE — Telephone Encounter (Signed)
Unless she has a prn ordered, he will make her have an eval appointment first.

## 2023-04-01 NOTE — Telephone Encounter (Signed)
She called because her back hurts and she wants to get a procedure. I told her Dr. Shireen Quan would be back next Thursday and we could ask him then. She was fine with that.

## 2023-04-02 ENCOUNTER — Encounter: Payer: Self-pay | Admitting: Pain Medicine

## 2023-04-13 ENCOUNTER — Encounter: Payer: Self-pay | Admitting: *Deleted

## 2023-04-21 NOTE — Progress Notes (Unsigned)
PROVIDER NOTE: Information contained herein reflects review and annotations entered in association with encounter. Interpretation of such information and data should be left to medically-trained personnel. Information provided to patient can be located elsewhere in the medical record under "Patient Instructions". Document created using STT-dictation technology, any transcriptional errors that may result from process are unintentional.    Patient: Cynthia Bennett  Service Category: E/M  Provider: Oswaldo Done, MD  DOB: 26-Nov-1932  DOS: 04/22/2023  Referring Provider: Jerl Mina, MD  MRN: [id number]  Specialty: Interventional Pain Management  PCP: Jerl Mina, MD  Type: Established Patient  Setting: Ambulatory outpatient    Location: Office  Delivery: Face-to-face     HPI  [name], a 89 y.o. year old female, is here today because of her No primary diagnosis found.. Ms. Oborn primary complain today is No chief complaint on file.  Pertinent problems: Ms. Zanardi has Fibromyalgia; Chronic pain syndrome; Lumbar radicular pain (Bilateral) (R>L); Chronic low back pain (1ry area of Pain) (Bilateral) (L>R) w/o sciatica; Grade 1 spondylolisthesis of L4 over L5; Lumbar discogenic pain syndrome; Lumbar facet syndrome (Bilateral) (L>R); Lumbar facet hypertrophy (Bilateral); Chronic sacroiliac joint pain (Left); Chronic hip pain (Left); Lumbar spondylosis; Abnormal MRI, lumbar spine (06/25/2016); Cervical spondylosis (Anterolisthesis of C5 over C6); Chronic lower extremity pain (2ry area of Pain) (Left); Chronic lumbar radicular pain (Left) (L5 Dermatome); Osteoarthritis of hip (Left); Chronic sacroiliac joint pain (Bilateral) (R>L); Chronic shoulder pain (Left); Osteoarthritis of shoulder (Left); Chronic sacroiliac joint pain (Right); Chronic hip pain (Right); Left upper arm pain; Myofascial pain syndrome (left upper extremity); Acromioclavicular joint pain (Left); Osteoarthritis of AC (acromioclavicular)  joint (Left); Supraspinatus tendon tear, sequela (Left); Spondylosis without myelopathy or radiculopathy, lumbosacral region; Other specified dorsopathies, sacral and sacrococcygeal region; Bertolotti's syndrome (Left); DDD (degenerative disc disease), lumbar; Severe (5-6 mm) L4-5 lumbar spinal stenosis and (8 mm) L3-4 spinal stenosis.; Osteoarthritis of hip (Right); Trigger point with back pain (Right); Chronic myofascial pain; Chronic low back pain (Right) w/o sciatica; Chronic hip pain (3ry area of Pain) (Bilateral) (R>L); Osteoarthritis of hips (Bilateral); Chronic low back pain (Left) w/o sciatica; Thoracic back pain (Left); DDD (degenerative disc disease), thoracic; DDD (degenerative disc disease), thoracolumbar; Scoliosis of thoracolumbar region due to degenerative disease of spine in adult; Weakness of left leg; and Numbness and tingling of left leg on their pertinent problem list. Pain Assessment: Severity of   is reported as a  /10. Location:    / . Onset:  . Quality:  . Timing:  . Modifying factor(s):  Marland Kitchen Vitals:  vitals were not taken for this visit.  BMI: Estimated body mass index is 25.69 kg/m as calculated from the following:   Height as of 03/13/23: 5\' 3"  (1.6 m).   Weight as of 03/13/23: 145 lb (65.8 kg). Last encounter: 01/21/2023. Last procedure: 10/28/2022.  Reason for encounter:  *** . ***  Pharmacotherapy Assessment  Analgesic: Oxycodone IR 5 mg, 1 tab PO daily (maximum of 5 mg/day of oxycodone.) MME/day: 7.5 mg/day.   Monitoring: [place] PMP: PDMP reviewed during this encounter.       Pharmacotherapy: No side-effects or adverse reactions reported. Compliance: No problems identified. Effectiveness: Clinically acceptable.  No notes on file  No results found for: "CBDTHCR" No results found for: "D8THCCBX" No results found for: "D9THCCBX"  UDS:  Summary  Date Value Ref Range Status  04/28/2022 Note  Final    Comment:     ==================================================================== [employer] [facility] 13 (MW) ==================================================================== Test  Result       Flag       Units  Drug Present and Declared for Prescription Verification   Oxycodone                      305          EXPECTED   ng/mg creat   Noroxycodone                   2439         EXPECTED   ng/mg creat    Sources of oxycodone include scheduled prescription medications.    Noroxycodone is an expected metabolite of oxycodone.  ==================================================================== Test                      Result    Flag   Units      Ref Range   Creatinine              41               mg/dL      >=16 ==================================================================== Declared Medications:  The flagging and interpretation on this report are based on the  following declared medications.  Unexpected results may arise from  inaccuracies in the declared medications.   **Note: The testing scope of this panel includes these medications:   Oxycodone   **Note: The testing scope of this panel does not include the  following reported medications:   Acetaminophen (Tylenol)  Bupropion (Wellbutrin)  Dorzolamide  Doxepin  Esomeprazole  Magnesium (Mag-Ox)  Ondansetron (Zofran)  Sertraline (Zoloft)  Timolol  Vitamin D3  Zolpidem (Ambien) ==================================================================== For clinical consultation, please call [phone]. ====================================================================       ROS  Constitutional: Denies any fever or chills Gastrointestinal: No reported hemesis, hematochezia, vomiting, or acute GI distress Musculoskeletal: Denies any acute onset joint swelling, redness, loss of ROM, or weakness Neurological: No reported episodes of acute onset apraxia, aphasia, dysarthria, agnosia, amnesia,  paralysis, loss of coordination, or loss of consciousness  Medication Review  Vitamin D3, acetaminophen, dorzolamide-timolol, doxepin, esomeprazole, magnesium oxide, naloxone, ondansetron, oxyCODONE, sertraline, traZODone, and zolpidem  History Review  Allergy: Ms. Pardo is allergic to ibuprofen. Drug: Ms. Mataya  reports no history of drug use. Alcohol:  reports no history of alcohol use. Tobacco:  reports that she has quit smoking. She has never used smokeless tobacco. Social: Ms. Akridge  reports that she has quit smoking. She has never used smokeless tobacco. She reports that she does not drink alcohol and does not use drugs. Medical:  has a past medical history of Dizzy spells, Fibromyalgia, Glaucoma, Hiatal hernia, Osteoarthritis, Osteoarthritis of spine with radiculopathy, lumbar region (08/14/2015), Peptic ulcer disease, Reflux, Spinal stenosis, Stroke (HCC), and TIA (transient ischemic attack). Surgical: Ms. Werk  has a past surgical history that includes Esophagogastroduodenoscopy (N/A, 04/16/2015); Appendectomy; and Abdominal hysterectomy. Family: family history includes COPD in her father; Mental illness in her mother.  Laboratory Chemistry Profile   Renal Lab Results  Component Value Date   BUN 14 03/13/2023   CREATININE 0.97 03/13/2023   BCR 11 (L) 01/10/2019   GFRAA 46 (L) 01/10/2019   GFRNONAA 57 (L) 03/13/2023    Hepatic Lab Results  Component Value Date   AST 17 07/31/2022   ALT 10 07/31/2022   ALBUMIN 3.6 07/31/2022   ALKPHOS 54 07/31/2022   LIPASE 139 04/27/2014    Electrolytes Lab Results  Component Value Date  NA 137 03/13/2023   K 4.1 03/13/2023   CL 106 03/13/2023   CALCIUM 9.0 03/13/2023   MG 2.3 01/10/2019    Bone Lab Results  Component Value Date   25OHVITD1 26 (L) 01/10/2019   25OHVITD2 1.6 01/10/2019   25OHVITD3 24 01/10/2019    Inflammation (CRP: Acute Phase) (ESR: Chronic Phase) Lab Results  Component Value Date   CRP 0.9 08/24/2020    ESRSEDRATE 51 (H) 01/10/2019         Note: Above Lab results reviewed.  Recent Imaging Review  DG PAIN CLINIC C-ARM 1-60 MIN NO REPORT Fluoro was used, but no Radiologist interpretation will be provided.  Please refer to "NOTES" tab for provider progress note. Note: Reviewed        Physical Exam  General appearance: Well nourished, well developed, and well hydrated. In no apparent acute distress Mental status: Alert, oriented x 3 (person, place, & time)       Respiratory: No evidence of acute respiratory distress Eyes: PERLA Vitals: There were no vitals taken for this visit. BMI: Estimated body mass index is 25.69 kg/m as calculated from the following:   Height as of 03/13/23: 5\' 3"  (1.6 m).   Weight as of 03/13/23: 145 lb (65.8 kg). Ideal: Patient weight not recorded  Assessment   Diagnosis Status  No diagnosis found. Controlled Controlled Controlled   Updated Problems: No problems updated.  Plan of Care  Problem-specific:  No problem-specific Assessment & Plan notes found for this encounter.  Ms. LEINAALA BUCKRIDGE has a current medication list which includes the following long-term medication(s): doxepin, oxycodone, oxycodone, oxycodone, trazodone, and zolpidem.  Pharmacotherapy (Medications Ordered): No orders of the defined types were placed in this encounter.  Orders:  No orders of the defined types were placed in this encounter.  Follow-up plan:   No follow-ups on file.      Interventional Therapies  Risk Factors  Complex Considerations:  Advanced age   Planned  Pending:   Diagnostic/therapeutic left L4-5 and L5-S1 lumbar facet MBB    Under consideration:   Diagnostic/therapeutic left L4-5 and L5-S1 lumbar facet MBB  Possible lumbar facet RFA    Completed:   Diagnostic left L5-S1 pseudoarthrosis inj. #1 (01/09/2022) (10/10 to 0/10)  Therapeutic left L4-5 LESI x8 (06/25/2021) (100/100/80/90)  Therapeutic right L4-5 LESI x1 (07/21/2017) (DNR-F/U)   Palliative right L5-S1 LESI x1 (09/27/2015) (100/100/90)  Therapeutic left L5-S1 LESI x1 (02/05/2016) (100/100/100/100)  Diagnostic right SI joint injection x2 (08/25/2019) (100/100/40/30)  Diagnostic left SI joint injection x2 (01/27/2019) (100/100/100/100)  Diagnostic left glenohumeral joint inj. x1 (04/29/2017) (100/100/98/98)  Left biceps muscle trigger point inj. x1 (07/21/2017) (DNR-F/U)  Right PSIS trigger point inj. x1 (07/27/2018) (100/100/50/>50)  Diagnostic right lumbar facet MBB x3 (01/09/2022) (10/10 to 0/10)  Diagnostic left lumbar facet MBB x4 (01/09/2022) (10/10 to 0/10)  Diagnostic left IA hip inj. x1 (01/27/2019) (100/100/100/100)    Completed by other providers:   None at this time   Therapeutic  Palliative (PRN) options:   None established         Recent Visits Date Type Provider Dept  01/21/23 Office Visit Delano Metz, MD [facility]  Showing recent visits within past 90 days and meeting all other requirements Future Appointments Date Type Provider Dept  04/22/23 Appointment Delano Metz, MD [facility]  Showing future appointments within next 90 days and meeting all other requirements  I discussed the assessment and treatment plan with the patient. The patient was provided  an opportunity to ask questions and all were answered. The patient agreed with the plan and demonstrated an understanding of the instructions.  Patient advised to call back or seek an in-person evaluation if the symptoms or condition worsens.  Duration of encounter: *** minutes.  Total time on encounter, as per AMA guidelines included both the face-to-face and non-face-to-face time personally spent by the physician and/or other qualified health care professional(s) on the day of the encounter (includes time in activities that require the physician or other qualified health care professional and does not include time in activities normally performed by clinical  staff). Physician's time may include the following activities when performed: Preparing to see the patient (e.g., pre-charting review of records, searching for previously ordered imaging, lab work, and nerve conduction tests) Review of prior analgesic pharmacotherapies. Reviewing PMP Interpreting ordered tests (e.g., lab work, imaging, nerve conduction tests) Performing post-procedure evaluations, including interpretation of diagnostic procedures Obtaining and/or reviewing separately obtained history Performing a medically appropriate examination and/or evaluation Counseling and educating the patient/family/caregiver Ordering medications, tests, or procedures Referring and communicating with other health care professionals (when not separately reported) Documenting clinical information in the electronic or other health record Independently interpreting results (not separately reported) and communicating results to the patient/ family/caregiver Care coordination (not separately reported)  Note by: Oswaldo Done, MD Date: 04/22/2023; Time: 11:16 AM

## 2023-04-22 ENCOUNTER — Encounter: Payer: Self-pay | Admitting: Pain Medicine

## 2023-04-22 ENCOUNTER — Ambulatory Visit: Payer: PPO | Attending: Pain Medicine | Admitting: Pain Medicine

## 2023-04-22 ENCOUNTER — Encounter: Payer: PPO | Admitting: Pain Medicine

## 2023-04-22 VITALS — BP 126/63 | HR 88 | Temp 97.5°F | Resp 99 | Ht 62.0 in | Wt 142.0 lb

## 2023-04-22 DIAGNOSIS — M5135 Other intervertebral disc degeneration, thoracolumbar region: Secondary | ICD-10-CM | POA: Diagnosis not present

## 2023-04-22 DIAGNOSIS — M25551 Pain in right hip: Secondary | ICD-10-CM | POA: Insufficient documentation

## 2023-04-22 DIAGNOSIS — Q7649 Other congenital malformations of spine, not associated with scoliosis: Secondary | ICD-10-CM | POA: Diagnosis not present

## 2023-04-22 DIAGNOSIS — G8929 Other chronic pain: Secondary | ICD-10-CM | POA: Diagnosis not present

## 2023-04-22 DIAGNOSIS — M4155 Other secondary scoliosis, thoracolumbar region: Secondary | ICD-10-CM

## 2023-04-22 DIAGNOSIS — M48062 Spinal stenosis, lumbar region with neurogenic claudication: Secondary | ICD-10-CM | POA: Diagnosis not present

## 2023-04-22 DIAGNOSIS — M25552 Pain in left hip: Secondary | ICD-10-CM | POA: Insufficient documentation

## 2023-04-22 DIAGNOSIS — M545 Low back pain, unspecified: Secondary | ICD-10-CM | POA: Diagnosis not present

## 2023-04-22 DIAGNOSIS — G894 Chronic pain syndrome: Secondary | ICD-10-CM

## 2023-04-22 DIAGNOSIS — M79605 Pain in left leg: Secondary | ICD-10-CM | POA: Diagnosis not present

## 2023-04-22 DIAGNOSIS — Z79899 Other long term (current) drug therapy: Secondary | ICD-10-CM | POA: Diagnosis not present

## 2023-04-22 DIAGNOSIS — Z79891 Long term (current) use of opiate analgesic: Secondary | ICD-10-CM | POA: Diagnosis not present

## 2023-04-22 DIAGNOSIS — M51369 Other intervertebral disc degeneration, lumbar region without mention of lumbar back pain or lower extremity pain: Secondary | ICD-10-CM

## 2023-04-22 DIAGNOSIS — M47816 Spondylosis without myelopathy or radiculopathy, lumbar region: Secondary | ICD-10-CM | POA: Diagnosis not present

## 2023-04-22 DIAGNOSIS — M5136 Other intervertebral disc degeneration, lumbar region: Secondary | ICD-10-CM | POA: Insufficient documentation

## 2023-04-22 DIAGNOSIS — M5416 Radiculopathy, lumbar region: Secondary | ICD-10-CM | POA: Diagnosis not present

## 2023-04-22 DIAGNOSIS — M4316 Spondylolisthesis, lumbar region: Secondary | ICD-10-CM | POA: Diagnosis not present

## 2023-04-22 DIAGNOSIS — M4726 Other spondylosis with radiculopathy, lumbar region: Secondary | ICD-10-CM | POA: Diagnosis not present

## 2023-04-22 MED ORDER — OXYCODONE HCL 5 MG PO TABS: 5.0000 mg | ORAL_TABLET | Freq: Every day | ORAL | 0 refills | Status: DC | PRN

## 2023-04-22 NOTE — Patient Instructions (Addendum)
______________________________________________________________________  Procedure instructions  Do not eat or drink fluids (other than water) for 6 hours before your procedure  No water for 2 hours before your procedure  Take your blood pressure medicine with a sip of water  Arrive 30 minutes before your appointment  Carefully read the "Preparing for your procedure" detailed instructions  If you have questions call us at [phone]  _____________________________________________________________________    ______________________________________________________________________  Preparing for your procedure  Appointments: If you think you may not be able to keep your appointment, call 24-48 hours in advance to cancel. We need time to make it available to others.  During your procedure appointment there will be: No Prescription Refills. No disability issues to discussed. No medication changes or discussions.  Instructions: Food intake: Avoid eating anything solid for at least 8 hours prior to your procedure. Clear liquid intake: You may take clear liquids such as water up to 2 hours prior to your procedure. (No carbonated drinks. No soda.) Transportation: Unless otherwise stated by your physician, bring a driver. Morning Medicines: Except for blood thinners, take all of your other morning medications with a sip of water. Make sure to take your heart and blood pressure medicines. If your blood pressure's lower number is above 100, the case will be rescheduled. Blood thinners: Make sure to stop your blood thinners as instructed.  If you take a blood thinner, but were not instructed to stop it, call our office [phone] and ask to talk to a nurse. Not stopping a blood thinner prior to certain procedures could lead to serious complications. Diabetics on insulin: Notify the staff so that you can be scheduled 1st case in the morning. If your diabetes requires high dose insulin,  take only  of your normal insulin dose the morning of the procedure and notify the staff that you have done so. Preventing infections: Shower with an antibacterial soap the morning of your procedure.  Build-up your immune system: Take 1000 mg of Vitamin C with every meal (3 times a day) the day prior to your procedure. Antibiotics: Inform the nursing staff if you are taking any antibiotics or if you have any conditions that may require antibiotics prior to procedures. (Example: recent joint implants)   Pregnancy: If you are pregnant make sure to notify the nursing staff. Not doing so may result in injury to the fetus, including death.  Sickness: If you have a cold, fever, or any active infections, call and cancel or reschedule your procedure. Receiving steroids while having an infection may result in complications. Arrival: You must be in the facility at least 30 minutes prior to your scheduled procedure. Tardiness: Your scheduled time is also the cutoff time. If you do not arrive at least 15 minutes prior to your procedure, you will be rescheduled.  Children: Do not bring any children with you. Make arrangements to keep them home. Dress appropriately: There is always a possibility that your clothing may get soiled. Avoid long dresses. Valuables: Do not bring any jewelry or valuables.  Reasons to call and reschedule or cancel your procedure: (Following these recommendations will minimize the risk of a serious complication.) Surgeries: Avoid having procedures within 2 weeks of any surgery. (Avoid for 2 weeks before or after any surgery). Flu Shots: Avoid having procedures within 2 weeks of a flu shots or . (Avoid for 2 weeks before or after immunizations). Barium: Avoid having a procedure within 7-10 days after having had a radiological study involving the use of   radiological contrast. (Myelograms, Barium swallow or enema study). Heart attacks: Avoid any elective procedures or surgeries for the  initial 6 months after a "Myocardial Infarction" (Heart Attack). Blood thinners: It is imperative that you stop these medications before procedures. Let us know if you if you take any blood thinner.  Infection: Avoid procedures during or within two weeks of an infection (including chest colds or gastrointestinal problems). Symptoms associated with infections include: Localized redness, fever, chills, night sweats or profuse sweating, burning sensation when voiding, cough, congestion, stuffiness, runny nose, sore throat, diarrhea, nausea, vomiting, cold or Flu symptoms, recent or current infections. It is specially important if the infection is over the area that we intend to treat. Heart and lung problems: Symptoms that may suggest an active cardiopulmonary problem include: cough, chest pain, breathing difficulties or shortness of breath, dizziness, ankle swelling, uncontrolled high or unusually low blood pressure, and/or palpitations. If you are experiencing any of these symptoms, cancel your procedure and contact your primary care physician for an evaluation.  Remember:  Regular Business hours are:  Monday to Thursday 8:00 AM to 4:00 PM  Provider's Schedule: Aritza Brunet, MD:  Procedure days: Tuesday and Thursday 7:30 AM to 4:00 PM  Bilal Lateef, MD:  Procedure days: Monday and Wednesday 7:30 AM to 4:00 PM  ______________________________________________________________________    ____________________________________________________________________________________________  General Risks and Possible Complications  Patient Responsibilities: It is important that you read this as it is part of your informed consent. It is our duty to inform you of the risks and possible complications associated with treatments offered to you. It is your responsibility as a patient to read this and to ask questions about anything that is not clear or that you believe was not covered in this  document.  Patient's Rights: You have the right to refuse treatment. You also have the right to change your mind, even after initially having agreed to have the treatment done. However, under this last option, if you wait until the last second to change your mind, you may be charged for the materials used up to that point.  Introduction: Medicine is not an exact science. Everything in Medicine, including the lack of treatment(s), carries the potential for danger, harm, or loss (which is by definition: Risk). In Medicine, a complication is a secondary problem, condition, or disease that can aggravate an already existing one. All treatments carry the risk of possible complications. The fact that a side effects or complications occurs, does not imply that the treatment was conducted incorrectly. It must be clearly understood that these can happen even when everything is done following the highest safety standards.  No treatment: You can choose not to proceed with the proposed treatment alternative. The "PRO(s)" would include: avoiding the risk of complications associated with the therapy. The "CON(s)" would include: not getting any of the treatment benefits. These benefits fall under one of three categories: diagnostic; therapeutic; and/or palliative. Diagnostic benefits include: getting information which can ultimately lead to improvement of the disease or symptom(s). Therapeutic benefits are those associated with the successful treatment of the disease. Finally, palliative benefits are those related to the decrease of the primary symptoms, without necessarily curing the condition (example: decreasing the pain from a flare-up of a chronic condition, such as incurable terminal cancer).  General Risks and Complications: These are associated to most interventional treatments. They can occur alone, or in combination. They fall under one of the following six (6) categories: no benefit or worsening of symptoms;    bleeding; infection; nerve damage; allergic reactions; and/or death. No benefits or worsening of symptoms: In Medicine there are no guarantees, only probabilities. No healthcare provider can ever guarantee that a medical treatment will work, they can only state the probability that it may. Furthermore, there is always the possibility that the condition may worsen, either directly, or indirectly, as a consequence of the treatment. Bleeding: This is more common if the patient is taking a blood thinner, either prescription or over the counter (example: Goody Powders, Fish oil, Aspirin, Garlic, etc.), or if suffering a condition associated with impaired coagulation (example: Hemophilia, cirrhosis of the liver, low platelet counts, etc.). However, even if you do not have one on these, it can still happen. If you have any of these conditions, or take one of these drugs, make sure to notify your treating physician. Infection: This is more common in patients with a compromised immune system, either due to disease (example: diabetes, cancer, human immunodeficiency virus [HIV], etc.), or due to medications or treatments (example: therapies used to treat cancer and rheumatological diseases). However, even if you do not have one on these, it can still happen. If you have any of these conditions, or take one of these drugs, make sure to notify your treating physician. Nerve Damage: This is more common when the treatment is an invasive one, but it can also happen with the use of medications, such as those used in the treatment of cancer. The damage can occur to small secondary nerves, or to large primary ones, such as those in the spinal cord and brain. This damage may be temporary or permanent and it may lead to impairments that can range from temporary numbness to permanent paralysis and/or brain death. Allergic Reactions: Any time a substance or material comes in contact with our body, there is the possibility of an  allergic reaction. These can range from a mild skin rash (contact dermatitis) to a severe systemic reaction (anaphylactic reaction), which can result in death. Death: In general, any medical intervention can result in death, most of the time due to an unforeseen complication. ____________________________________________________________________________________________   ____________________________________________________________________________________________  Opioid Pain Medication Update  To: All patients taking opioid pain medications. (I.e.: hydrocodone, hydromorphone, oxycodone, oxymorphone, morphine, codeine, methadone, tapentadol, tramadol, buprenorphine, fentanyl, etc.)  Re: Updated review of side effects and adverse reactions of opioid analgesics, as well as new information about long term effects of this class of medications.  Direct risks of long-term opioid therapy are not limited to opioid addiction and overdose. Potential medical risks include serious fractures, breathing problems during sleep, hyperalgesia, immunosuppression, chronic constipation, bowel obstruction, myocardial infarction, and tooth decay secondary to xerostomia.  Unpredictable adverse effects that can occur even if you take your medication correctly: Cognitive impairment, respiratory depression, and death. Most people think that if they take their medication "correctly", and "as instructed", that they will be safe. Nothing could be farther from the truth. In reality, a significant amount of recorded deaths associated with the use of opioids has occurred in individuals that had taken the medication for a long time, and were taking their medication correctly. The following are examples of how this can happen: Patient taking his/her medication for a long time, as instructed, without any side effects, is given a certain antibiotic or another unrelated medication, which in turn triggers a "Drug-to-drug interaction"  leading to disorientation, cognitive impairment, impaired reflexes, respiratory depression or an untoward event leading to serious bodily harm or injury, including death.  Patient taking his/her   medication for a long time, as instructed, without any side effects, develops an acute impairment of liver and/or kidney function. This will lead to a rapid inability of the body to breakdown and eliminate their pain medication, which will result in effects similar to an "overdose", but with the same medicine and dose that they had always taken. This again may lead to disorientation, cognitive impairment, impaired reflexes, respiratory depression or an untoward event leading to serious bodily harm or injury, including death.  A similar problem will occur with patients as they grow older and their liver and kidney function begins to decrease as part of the aging process.  Background information: Historically, the original case for using long-term opioid therapy to treat chronic noncancer pain was based on safety assumptions that subsequent experience has called into question. In 1996, the American Pain Society and the American Academy of Pain Medicine issued a consensus statement supporting long-term opioid therapy. This statement acknowledged the dangers of opioid prescribing but concluded that the risk for addiction was low; respiratory depression induced by opioids was short-lived, occurred mainly in opioid-naive patients, and was antagonized by pain; tolerance was not a common problem; and efforts to control diversion should not constrain opioid prescribing. This has now proven to be wrong. Experience regarding the risks for opioid addiction, misuse, and overdose in community practice has failed to support these assumptions.  According to the Centers for Disease Control and Prevention, fatal overdoses involving opioid analgesics have increased sharply over the past decade. Currently, more than 96,700 people die  from drug overdoses every year. Opioids are a factor in 7 out of every 10 overdose deaths. Deaths from drug overdose have surpassed motor vehicle accidents as the leading cause of death for individuals between the ages of 35 and 54.  Clinical data suggest that neuroendocrine dysfunction may be very common in both men and women, potentially causing hypogonadism, erectile dysfunction, infertility, decreased libido, osteoporosis, and depression. Recent studies linked higher opioid dose to increased opioid-related mortality. Controlled observational studies reported that long-term opioid therapy may be associated with increased risk for cardiovascular events. Subsequent meta-analysis concluded that the safety of long-term opioid therapy in elderly patients has not been proven.   Side Effects and adverse reactions: Common side effects: Drowsiness (sedation). Dizziness. Nausea and vomiting. Constipation. Physical dependence -- Dependence often manifests with withdrawal symptoms when opioids are discontinued or decreased. Tolerance -- As you take repeated doses of opioids, you require increased medication to experience the same effect of pain relief. Respiratory depression -- This can occur in healthy people, especially with higher doses. However, people with COPD, asthma or other lung conditions may be even more susceptible to fatal respiratory impairment.  Uncommon side effects: An increased sensitivity to feeling pain and extreme response to pain (hyperalgesia). Chronic use of opioids can lead to this. Delayed gastric emptying (the process by which the contents of your stomach are moved into your small intestine). Muscle rigidity. Immune system and hormonal dysfunction. Quick, involuntary muscle jerks (myoclonus). Arrhythmia. Itchy skin (pruritus). Dry mouth (xerostomia).  Long-term side effects: Chronic constipation. Sleep-disordered breathing (SDB). Increased risk of bone  fractures. Hypothalamic-pituitary-adrenal dysregulation. Increased risk of overdose.  RISKS: Fractures and Falls:  Opioids increase the risk and incidence of falls. This is of particular importance in elderly patients.  Endocrine System:  Long-term administration is associated with endocrine abnormalities (endocrinopathies). (Also known as Opioid-induced Endocrinopathy) Influences on both the hypothalamic-pituitary-adrenal axis?and the hypothalamic-pituitary-gonadal axis have been demonstrated with consequent hypogonadism and adrenal insufficiency   in both sexes. Hypogonadism and decreased levels of dehydroepiandrosterone sulfate have been reported in men and women. Endocrine effects include: Amenorrhoea in women (abnormal absence of menstruation) Reduced libido in both sexes Decreased sexual function Erectile dysfunction in men Hypogonadisms (decreased testicular function with shrinkage of testicles) Infertility Depression and fatigue Loss of muscle mass Anxiety Depression Immune suppression Hyperalgesia Weight gain Anemia Osteoporosis Patients (particularly women of childbearing age) should avoid opioids. There is insufficient evidence to recommend routine monitoring of asymptomatic patients taking opioids in the long-term for hormonal deficiencies.  Immune System: Human studies have demonstrated that opioids have an immunomodulating effect. These effects are mediated via opioid receptors both on immune effector cells and in the central nervous system. Opioids have been demonstrated to have adverse effects on antimicrobial response and anti-tumour surveillance. Buprenorphine has been demonstrated to have no impact on immune function.  Opioid Induced Hyperalgesia: Human studies have demonstrated that prolonged use of opioids can lead to a state of abnormal pain sensitivity, sometimes called opioid induced hyperalgesia (OIH). Opioid induced hyperalgesia is not usually seen in the  absence of tolerance to opioid analgesia. Clinically, hyperalgesia may be diagnosed if the patient on long-term opioid therapy presents with increased pain. This might be qualitatively and anatomically distinct from pain related to disease progression or to breakthrough pain resulting from development of opioid tolerance. Pain associated with hyperalgesia tends to be more diffuse than the pre-existing pain and less defined in quality. Management of opioid induced hyperalgesia requires opioid dose reduction.  Cancer: Chronic opioid therapy has been associated with an increased risk of cancer among noncancer patients with chronic pain. This association was more evident in chronic strong opioid users. Chronic opioid consumption causes significant pathological changes in the small intestine and colon. Epidemiological studies have found that there is a link between opium dependence and initiation of gastrointestinal cancers. Cancer is the second leading cause of death after cardiovascular disease. Chronic use of opioids can cause multiple conditions such as GERD, immunosuppression and renal damage as well as carcinogenic effects, which are associated with the incidence of cancers.   Mortality: Long-term opioid use has been associated with increased mortality among patients with chronic non-cancer pain (CNCP).  Prescription of long-acting opioids for chronic noncancer pain was associated with a significantly increased risk of all-cause mortality, including deaths from causes other than overdose.  Reference: Von Korff M, Kolodny A, Deyo RA, Chou R. Long-term opioid therapy reconsidered. Ann Intern Med. 2011 Sep 6;155(5):325-8. doi: 10.7326/[id number]. PMID: 21893626; PMCID: PMC3280085. Bedson J, Chen Y, Ashworth J, Hayward RA, Dunn KM, Jordan KP. Risk of adverse events in patients prescribed long-term opioids: A cohort study in the [place] Clinical Practice Research [employer]. Eur J Pain. 2019  May;23(5):908-922. doi: 10.1002/ejp.1357. Epub 2019 Jan 31. PMID: 30620116. Colameco S, Coren JS, Ciervo CA. Continuous opioid treatment for chronic noncancer pain: a time for moderation in prescribing. Postgrad Med. 2009 Jul;121(4):61-6. doi: 10.3810/pgm.2009.07.2032. PMID: 19641271. Chou R, Turner JA, Devine EB, Hansen RN, Sullivan SD, Blazina I, Dana T, Bougatsos C, Deyo RA. The effectiveness and risks of long-term opioid therapy for chronic pain: a systematic review for a National Institutes of Health Pathways to Prevention Workshop. Ann Intern Med. 2015 Feb 17;162(4):276-86. doi: 10.7326/M14-2559. PMID: 25581257. Warner M, Chen LH, Makuc DM. NCHS Data Brief No. 22. [place]: Centers for Disease Control and Prevention; 2009. Sep, Increase in Fatal Poisonings Involving Opioid Analgesics in the [place], 1999-2006. Song IA, Choi HR, Oh TK. Long-term opioid use and mortality in patients with chronic   non-cancer pain: Ten-year follow-up study in [place] from 2010 through 2019. EClinicalMedicine. 2022 Jul 18;51:101558. doi: 10.1016/j.eclinm.2022.101558. PMID: 35875817; PMCID: PMC9304910. Huser, W., Schubert, T., Vogelmann, T. et al. All-cause mortality in patients with long-term opioid therapy compared with non-opioid analgesics for chronic non-cancer pain: a database study. BMC Med 18, 162 (2020). https://doi.org/10.1186/[email or web address] Rashidian H, Zendehdel K, Kamangar F, Malekzadeh R, Haghdoost AA. An Ecological Study of the Association between Opiate Use and Incidence of Cancers. Addict Health. 2016 Fall;8(4):252-260. PMID: 28819556; PMCID: [id number].  Our Goal: Our goal is to control your pain with means other than the use of opioid pain medications.  Our Recommendation: Talk to your physician about coming off of these medications. We can assist you with the tapering down and stopping these medicines. Based on the new information, even if you cannot completely stop the medication, a  decrease in the dose may be associated with a lesser risk. Ask for other means of controlling the pain. Decrease or eliminate those factors that significantly contribute to your pain such as smoking, obesity, and a diet heavily tilted towards "inflammatory" nutrients.  Last Updated: 01/07/2023   ____________________________________________________________________________________________     ____________________________________________________________________________________________  Transfer of Pain Medication between Pharmacies  Re: 2023 DEA Clarification on existing regulation  Published on DEA Website: July 11, 2022  Title: Revised Regulation Allows DEA-Registered Pharmacies to Transfer Electronic Prescriptions at a Patient's Request DEA Headquarters Division - Public Information Office  "Patients now have the ability to request their electronic prescription be transferred to another pharmacy without having to go back to their practitioner to initiate the request. This revised regulation went into effect on Monday, July 07, 2022.     At a patient's request, a DEA-registered retail pharmacy can now transfer an electronic prescription for a controlled substance (schedules II-V) to another DEA-registered retail pharmacy. Prior to this change, patients would have to go through their practitioner to cancel their prescription and have it re-issued to a different pharmacy. The process was taxing and time consuming for both patients and practitioners.    The Drug Enforcement Administration (DEA) published its intent to revise the process for transferring electronic prescriptions on September 28, 2020.  The final rule was published in the federal register on June 05, 2022 and went into effect 30 days later.  Under the final rule, a prescription can only be transferred once between pharmacies, and only if allowed under existing state or other applicable law. The prescription must remain in  its electronic form; may not be altered in any way; and the transfer must be communicated directly between two licensed pharmacists. It's important to note, any authorized refills transfer with the original prescription, which means the entire prescription will be filled at the same pharmacy."    REFERENCES: 1. DEA website announcement https://www.dea.gov/stories/2023/2023-07/2022-09-01/revised-regulation-allows-dea-registered-pharmacies-transfer  2. Department of Justice website  https://www.govinfo.gov/content/pkg/FR-2022-06-05/pdf/2023-15847.pdf  3. DEPARTMENT OF JUSTICE Drug Enforcement Administration 21 CFR Part 1306 [Docket No. [id number]] RIN [id number] "Transfer of Electronic Prescriptions for Schedules II-V Controlled Substances Between Pharmacies for Initial Filling"  ____________________________________________________________________________________________     _______________________________________________________________________  Medication Rules  Purpose: To inform patients, and their family members, of our medication rules and regulations.  Applies to: All patients receiving prescriptions from our practice (written or electronic).  Pharmacy of record: This is the pharmacy where your electronic prescriptions will be sent. Make sure we have the correct one.  Electronic prescriptions: In compliance with the [place] Strengthen Opioid Misuse Prevention (STOP) Act of 2017 (Session Law 2017-74/H243), effective November 10, 2018, all controlled substances must be electronically prescribed. Written prescriptions, faxing, or calling prescriptions to a pharmacy will no longer be done.  Prescription refills: These will be provided only during in-person appointments. No medications will be renewed without a "face-to-face" evaluation with your provider. Applies to all prescriptions.  NOTE: The following applies primarily to controlled substances (Opioid* Pain Medications).    Type of encounter (visit): For patients receiving controlled substances, face-to-face visits are required. (Not an option and not up to the patient.)  Patient's responsibilities: Pain Pills: Bring all pain pills to every appointment (except for procedure appointments). Pill Bottles: Bring pills in original pharmacy bottle. Bring bottle, even if empty. Always bring the bottle of the most recent fill.  Medication refills: You are responsible for knowing and keeping track of what medications you are taking and when is it that you will need a refill. The day before your appointment: write a list of all prescriptions that need to be refilled. The day of the appointment: give the list to the admitting nurse. Prescriptions will be written only during appointments. No prescriptions will be written on procedure days. If you forget a medication: it will not be "Called in", "Faxed", or "electronically sent". You will need to get another appointment to get these prescribed. No early refills. Do not call asking to have your prescription filled early. Partial  or short prescriptions: Occasionally your pharmacy may not have enough pills to fill your prescription.  NEVER ACCEPT a partial fill or a prescription that is short of the total amount of pills that you were prescribed.  With controlled substances the law allows 72 hours for the pharmacy to complete the prescription.  If the prescription is not completed within 72 hours, the pharmacist will require a new prescription to be written. This means that you will be short on your medicine and we WILL NOT send another prescription to complete your original prescription.  Instead, request the pharmacy to send a carrier to a nearby branch to get enough medication to provide you with your full prescription. Prescription Accuracy: You are responsible for carefully inspecting your prescriptions before leaving our office. Have the discharge nurse carefully go over each  prescription with you, before taking them home. Make sure that your name is accurately spelled, that your address is correct. Check the name and dose of your medication to make sure it is accurate. Check the number of pills, and the written instructions to make sure they are clear and accurate. Make sure that you are given enough medication to last until your next medication refill appointment. Taking Medication: Take medication as prescribed. When it comes to controlled substances, taking less pills or less frequently than prescribed is permitted and encouraged. Never take more pills than instructed. Never take the medication more frequently than prescribed.  Inform other Doctors: Always inform, all of your healthcare providers, of all the medications you take. Pain Medication from other Providers: You are not allowed to accept any additional pain medication from any other Doctor or Healthcare provider. There are two exceptions to this rule. (see below) In the event that you require additional pain medication, you are responsible for notifying us, as stated below. Cough Medicine: Often these contain an opioid, such as codeine or hydrocodone. Never accept or take cough medicine containing these opioids if you are already taking an opioid* medication. The combination may cause respiratory failure and death. Medication Agreement: You are responsible for carefully reading and following our Medication Agreement. This must   be signed before receiving any prescriptions from our practice. Safely store a copy of your signed Agreement. Violations to the Agreement will result in no further prescriptions. (Additional copies of our Medication Agreement are available upon request.) Laws, Rules, & Regulations: All patients are expected to follow all Federal and State Laws, Statutes, Rules, & Regulations. Ignorance of the Laws does not constitute a valid excuse.  Illegal drugs and Controlled Substances: The use of illegal  substances (including, but not limited to marijuana and its derivatives) and/or the illegal use of any controlled substances is strictly prohibited. Violation of this rule may result in the immediate and permanent discontinuation of any and all prescriptions being written by our practice. The use of any illegal substances is prohibited. Adopted CDC guidelines & recommendations: Target dosing levels will be at or below 60 MME/day. Use of benzodiazepines** is not recommended.  Exceptions: There are only two exceptions to the rule of not receiving pain medications from other Healthcare Providers. Exception #1 (Emergencies): In the event of an emergency (i.e.: accident requiring emergency care), you are allowed to receive additional pain medication. However, you are responsible for: As soon as you are able, call our office [phone], at any time of the day or night, and leave a message stating your name, the date and nature of the emergency, and the name and dose of the medication prescribed. In the event that your call is answered by a member of our staff, make sure to document and save the date, time, and the name of the person that took your information.  Exception #2 (Planned Surgery): In the event that you are scheduled by another doctor or dentist to have any type of surgery or procedure, you are allowed (for a period no longer than 30 days), to receive additional pain medication, for the acute post-op pain. However, in this case, you are responsible for picking up a copy of our "Post-op Pain Management for Surgeons" handout, and giving it to your surgeon or dentist. This document is available at our office, and does not require an appointment to obtain it. Simply go to our office during business hours (Monday-Thursday from 8:00 AM to 4:00 PM) (Friday 8:00 AM to 12:00 Noon) or if you have a scheduled appointment with us, prior to your surgery, and ask for it by name. In addition, you are responsible for:  calling our office [phone], at any time of the day or night, and leaving a message stating your name, name of your surgeon, type of surgery, and date of procedure or surgery. Failure to comply with your responsibilities may result in termination of therapy involving the controlled substances. Medication Agreement Violation. Following the above rules, including your responsibilities will help you in avoiding a Medication Agreement Violation ("Breaking your Pain Medication Contract").  Consequences:  Not following the above rules may result in permanent discontinuation of medication prescription therapy.  *Opioid medications include: morphine, codeine, oxycodone, oxymorphone, hydrocodone, hydromorphone, meperidine, tramadol, tapentadol, buprenorphine, fentanyl, methadone. **Benzodiazepine medications include: diazepam (Valium), alprazolam (Xanax), clonazepam (Klonopine), lorazepam (Ativan), clorazepate (Tranxene), chlordiazepoxide (Librium), estazolam (Prosom), oxazepam (Serax), temazepam (Restoril), triazolam (Halcion) (Last updated: 09/02/2022) ______________________________________________________________________    ______________________________________________________________________  Medication Recommendations and Reminders  Applies to: All patients receiving prescriptions (written and/or electronic).  Medication Rules & Regulations: You are responsible for reading, knowing, and following our "Medication Rules" document. These exist for your safety and that of others. They are not flexible and neither are we. Dismissing or ignoring them is an act   of "non-compliance" that may result in complete and irreversible termination of such medication therapy. For safety reasons, "non-compliance" will not be tolerated. As with the [place] fundamental legal principle of "ignorance of the law is no defense", we will accept no excuses for not having read and knowing the content of documents provided to  you by our practice.  Pharmacy of record:  Definition: This is the pharmacy where your electronic prescriptions will be sent.  We do not endorse any particular pharmacy. It is up to you and your insurance to decide what pharmacy to use.  We do not restrict you in your choice of pharmacy. However, once we write for your prescriptions, we will NOT be re-sending more prescriptions to fix restricted supply problems created by your pharmacy, or your insurance.  The pharmacy listed in the electronic medical record should be the one where you want electronic prescriptions to be sent. If you choose to change pharmacy, simply notify our nursing staff. Changes will be made only during your regular appointments and not over the phone.  Recommendations: Keep all of your pain medications in a safe place, under lock and key, even if you live alone. We will NOT replace lost, stolen, or damaged medication. We do not accept "Police Reports" as proof of medications having been stolen. After you fill your prescription, take 1 week's worth of pills and put them away in a safe place. You should keep a separate, properly labeled bottle for this purpose. The remainder should be kept in the original bottle. Use this as your primary supply, until it runs out. Once it's gone, then you know that you have 1 week's worth of medicine, and it is time to come in for a prescription refill. If you do this correctly, it is unlikely that you will ever run out of medicine. To make sure that the above recommendation works, it is very important that you make sure your medication refill appointments are scheduled at least 1 week before you run out of medicine. To do this in an effective manner, make sure that you do not leave the office without scheduling your next medication management appointment. Always ask the nursing staff to show you in your prescription , when your medication will be running out. Then arrange for the receptionist to get  you a return appointment, at least 7 days before you run out of medicine. Do not wait until you have 1 or 2 pills left, to come in. This is very poor planning and does not take into consideration that we may need to cancel appointments due to bad weather, sickness, or emergencies affecting our staff. DO NOT ACCEPT A "Partial Fill": If for any reason your pharmacy does not have enough pills/tablets to completely fill or refill your prescription, do not allow for a "partial fill". The law allows the pharmacy to complete that prescription within 72 hours, without requiring a new prescription. If they do not fill the rest of your prescription within those 72 hours, you will need a separate prescription to fill the remaining amount, which we will NOT provide. If the reason for the partial fill is your insurance, you will need to talk to the pharmacist about payment alternatives for the remaining tablets, but again, DO NOT ACCEPT A PARTIAL FILL, unless you can trust your pharmacist to obtain the remainder of the pills within 72 hours.  Prescription refills and/or changes in medication(s):  Prescription refills, and/or changes in dose or medication, will be conducted only during   scheduled medication management appointments. (Applies to both, written and electronic prescriptions.) No refills on procedure days. No medication will be changed or started on procedure days. No changes, adjustments, and/or refills will be conducted on a procedure day. Doing so will interfere with the diagnostic portion of the procedure. No phone refills. No medications will be "called into the pharmacy". No Fax refills. No weekend refills. No Holliday refills. No after hours refills.  Remember:  Business hours are:  Monday to Thursday 8:00 AM to 4:00 PM Provider's Schedule: Aftan Vint, MD - Appointments are:  Medication management: Monday and Wednesday 8:00 AM to 4:00 PM Procedure day: Tuesday and Thursday 7:30 AM to 4:00  PM Bilal Lateef, MD - Appointments are:  Medication management: Tuesday and Thursday 8:00 AM to 4:00 PM Procedure day: Monday and Wednesday 7:30 AM to 4:00 PM (Last update: 09/02/2022) ______________________________________________________________________   ____________________________________________________________________________________________  Naloxone Nasal Spray  Why am I receiving this medication? [place] STOP ACT requires that all patients taking high dose opioids or at risk of opioids respiratory depression, be prescribed an opioid reversal agent, such as Naloxone (AKA: Narcan).  What is this medication? NALOXONE (nal OX one) treats opioid overdose, which causes slow or shallow breathing, severe drowsiness, or trouble staying awake. Call emergency services after using this medication. You may need additional treatment. Naloxone works by reversing the effects of opioids. It belongs to a group of medications called opioid blockers.  COMMON BRAND NAME(S): Kloxxado, Narcan  What should I tell my care team before I take this medication? They need to know if you have any of these conditions: Heart disease Substance use disorder An unusual or allergic reaction to naloxone, other medications, foods, dyes, or preservatives Pregnant or trying to get pregnant Breast-feeding  When to use this medication? This medication is to be used for the treatment of respiratory depression (less than 8 breaths per minute) secondary to opioid overdose.   How to use this medication? This medication is for use in the nose. Lay the person on their back. Support their neck with your hand and allow the head to tilt back before giving the medication. The nasal spray should be given into 1 nostril. After giving the medication, move the person onto their side. Do not remove or test the nasal spray until ready to use. Get emergency medical help right away after giving the first dose of this  medication, even if the person wakes up. You should be familiar with how to recognize the signs and symptoms of a narcotic overdose. If more doses are needed, give the additional dose in the other nostril. Talk to your care team about the use of this medication in children. While this medication may be prescribed for children as young as newborns for selected conditions, precautions do apply.  Naloxone Overdosage: If you think you have taken too much of this medicine contact a poison control center or emergency room at once.  NOTE: This medicine is only for you. Do not share this medicine with others.  What if I miss a dose? This does not apply.  What may interact with this medication? This is only used during an emergency. No interactions are expected during emergency use. This list may not describe all possible interactions. Give your health care provider a list of all the medicines, herbs, non-prescription drugs, or dietary supplements you use. Also tell them if you smoke, drink alcohol, or use illegal drugs. Some items may interact with your medicine.  What should   I watch for while using this medication? Keep this medication ready for use in the case of an opioid overdose. Make sure that you have the phone number of your care team and local hospital ready. You may need to have additional doses of this medication. Each nasal spray contains a single dose. Some emergencies may require additional doses. After use, bring the treated person to the nearest hospital or call 911. Make sure the treating care team knows that the person has received a dose of this medication. You will receive additional instructions on what to do during and after use of this medication before an emergency occurs.  What side effects may I notice from receiving this medication? Side effects that you should report to your care team as soon as possible: Allergic reactions--skin rash, itching, hives, swelling of the face,  lips, tongue, or throat Side effects that usually do not require medical attention (report these to your care team if they continue or are bothersome): Constipation Dryness or irritation inside the nose Headache Increase in blood pressure Muscle spasms Stuffy nose Toothache This list may not describe all possible side effects. Call your doctor for medical advice about side effects. You may report side effects to FDA at 1-800-FDA-1088.  Where should I keep my medication? Because this is an emergency medication, you should keep it with you at all times.  Keep out of the reach of children and pets. Store between 20 and 25 degrees C (68 and 77 degrees F). Do not freeze. Throw away any unused medication after the expiration date. Keep in original box until ready to use.  NOTE: This sheet is a summary. It may not cover all possible information. If you have questions about this medicine, talk to your doctor, pharmacist, or health care provider.   2023 Elsevier/Gold Standard (2021-07-05 00:00:00)  ____________________________________________________________________________________________   

## 2023-04-22 NOTE — Progress Notes (Signed)
Nursing Pain Medication Assessment:  Safety precautions to be maintained throughout the outpatient stay will include: orient to surroundings, keep bed in low position, maintain call bell within reach at all times, provide assistance with transfer out of bed and ambulation.  Medication Inspection Compliance: Cynthia Bennett did not comply with our request to bring her pills to be counted. She was reminded that bringing the medication bottles, even when empty, is a requirement.  Medication: None brought in. Pill/Patch Count: None available to be counted. Bottle Appearance: No container available. Did not bring bottle(s) to appointment. Filled Date: N/A Last Medication intake:  TodaySafety precautions to be maintained throughout the outpatient stay will include: orient to surroundings, keep bed in low position, maintain call bell within reach at all times, provide assistance with transfer out of bed and ambulation.

## 2023-04-26 LAB — TOXASSURE SELECT 13 (MW), URINE

## 2023-04-27 NOTE — Progress Notes (Unsigned)
PROVIDER NOTE: Interpretation of information contained herein should be left to medically-trained personnel. Specific patient instructions are provided elsewhere under "Patient Instructions" section of medical record. This document was created in part using STT-dictation technology, any transcriptional errors that may result from this process are unintentional.  Patient: Cynthia Bennett Type: Established DOB: 1932-12-12 MRN: [id number] PCP: Jerl Mina, MD  Service: Procedure DOS: 04/28/2023 Setting: Ambulatory Location: Ambulatory outpatient facility Delivery: Face-to-face Provider: Oswaldo Done, MD Specialty: Interventional Pain Management Specialty designation: 09 Location: Outpatient facility Ref. Prov.: Jerl Mina, MD       Interventional Therapy   Procedure: Lumbar Facet, Medial Branch Block(s) #5  Laterality: Left  Level: L3, L4, L5, and S1 Medial Branch Level(s). Injecting these levels blocks the L4-5 and L5-S1 lumbar facet joints. Imaging: Fluoroscopic guidance         Anesthesia: Local anesthesia (1-2% Lidocaine) Anxiolysis: None                 Sedation: No Sedation                (did not follow n.p.o. orders) DOS: 04/28/2023 Performed by: Oswaldo Done, MD  Primary Purpose: Diagnostic/Therapeutic Indications: Low back pain severe enough to impact quality of life or function. 1. Lumbar facet syndrome (Bilateral) (L>R)   2. Lumbar facet hypertrophy (Bilateral)   3. Spondylosis without myelopathy or radiculopathy, lumbosacral region   4. DDD (degenerative disc disease), lumbar   5. Grade 1 spondylolisthesis of L4 over L5   6. Lumbar facet joint pain   7. Bertolotti's syndrome (Left)   8. Pseudoarthrosis of lumbar spine    NAS-11 Pain score:   Pre-procedure: 10-Worst pain ever/10   Post-procedure: 0-No pain/10   Note: Today in addition to blocking the left-sided medial branches for the L4-5 and L5-S1 facets, I also injected the patient's pseudoarthrosis.   In the past this has provided her with considerable relief of the pain.    Position / Prep / Materials:  Position: Prone  Prep solution: DuraPrep (Iodine Povacrylex [0.7% available iodine] and Isopropyl Alcohol, 74% w/w) Area Prepped: Posterolateral Lumbosacral Spine (Wide prep: From the lower border of the scapula down to the end of the tailbone and from flank to flank.)  Materials:  Tray: Block Needle(s):  Type: Spinal  Gauge (G): 22  Length: 3.5-in Qty: 3      Pre-op H&P Assessment:  [name] is a 89 y.o. (year old), female patient, seen today for interventional treatment. She  has a past surgical history that includes Esophagogastroduodenoscopy (N/A, 04/16/2015); Appendectomy; and Abdominal hysterectomy. [name] has a current medication list which includes the following prescription(s): acetaminophen, vitamin d3, cyanocobalamin, dorzolamide-timolol, doxepin, esomeprazole, magnesium oxide, naloxone, ondansetron, oxycodone, sertraline, trazodone, and zolpidem. Her primarily concern today is the Back Pain (lower)  Initial Vital Signs:  Pulse/HCG Rate: 66ECG Heart Rate: 62 Temp: (!) 97.3 F (36.3 C) Resp: (!) 21 BP: (!) 87/77 (Dr Laban Emperor notified; pt asymptomatic) SpO2: 97 %  BMI: Estimated body mass index is 25.61 kg/m as calculated from the following:   Height as of this encounter: 5\' 2"  (1.575 m).   Weight as of this encounter: 140 lb (63.5 kg).  Risk Assessment: Allergies: Reviewed. She is allergic to ibuprofen.  Allergy Precautions: None required Coagulopathies: Reviewed. None identified.  Blood-thinner therapy: None at this time Active Infection(s): Reviewed. None identified. [name] is afebrile  Site Confirmation: [name] was asked to confirm the procedure and laterality before marking the site Procedure  checklist: Completed Consent: Before the procedure and under the influence of no sedative(s), amnesic(s), or anxiolytics, the patient was informed of the treatment  options, risks and possible complications. To fulfill our ethical and legal obligations, as recommended by the American Medical Association's Code of Ethics, I have informed the patient of my clinical impression; the nature and purpose of the treatment or procedure; the risks, benefits, and possible complications of the intervention; the alternatives, including doing nothing; the risk(s) and benefit(s) of the alternative treatment(s) or procedure(s); and the risk(s) and benefit(s) of doing nothing. The patient was provided information about the general risks and possible complications associated with the procedure. These may include, but are not limited to: failure to achieve desired goals, infection, bleeding, organ or nerve damage, allergic reactions, paralysis, and death. In addition, the patient was informed of those risks and complications associated to Spine-related procedures, such as failure to decrease pain; infection (i.e.: Meningitis, epidural or intraspinal abscess); bleeding (i.e.: epidural hematoma, subarachnoid hemorrhage, or any other type of intraspinal or peri-dural bleeding); organ or nerve damage (i.e.: Any type of peripheral nerve, nerve root, or spinal cord injury) with subsequent damage to sensory, motor, and/or autonomic systems, resulting in permanent pain, numbness, and/or weakness of one or several areas of the body; allergic reactions; (i.e.: anaphylactic reaction); and/or death. Furthermore, the patient was informed of those risks and complications associated with the medications. These include, but are not limited to: allergic reactions (i.e.: anaphylactic or anaphylactoid reaction(s)); adrenal axis suppression; blood sugar elevation that in diabetics may result in ketoacidosis or comma; water retention that in patients with history of congestive heart failure may result in shortness of breath, pulmonary edema, and decompensation with resultant heart failure; weight gain; swelling or  edema; medication-induced neural toxicity; particulate matter embolism and blood vessel occlusion with resultant organ, and/or nervous system infarction; and/or aseptic necrosis of one or more joints. Finally, the patient was informed that Medicine is not an exact science; therefore, there is also the possibility of unforeseen or unpredictable risks and/or possible complications that may result in a catastrophic outcome. The patient indicated having understood very clearly. We have given the patient no guarantees and we have made no promises. Enough time was given to the patient to ask questions, all of which were answered to the patient's satisfaction. Ms. Wilt has indicated that she wanted to continue with the procedure. Attestation: I, the ordering provider, attest that I have discussed with the patient the benefits, risks, side-effects, alternatives, likelihood of achieving goals, and potential problems during recovery for the procedure that I have provided informed consent. Date  Time: 04/28/2023 11:46 AM   Pre-Procedure Preparation:  Monitoring: As per clinic protocol. Respiration, ETCO2, SpO2, BP, heart rate and rhythm monitor placed and checked for adequate function Safety Precautions: Patient was assessed for positional comfort and pressure points before starting the procedure. Time-out: I initiated and conducted the "Time-out" before starting the procedure, as per protocol. The patient was asked to participate by confirming the accuracy of the "Time Out" information. Verification of the correct person, site, and procedure were performed and confirmed by me, the nursing staff, and the patient. "Time-out" conducted as per Joint Commission's Universal Protocol (UP.01.01.01). Time: 1216 Start Time: 1216 hrs.  Description of Procedure:          Laterality: (see above) Targeted Levels: (see above)  Safety Precautions: Aspiration looking for blood return was conducted prior to all injections. At  no point did we inject any substances, as a  needle was being advanced. Before injecting, the patient was told to immediately notify me if she was experiencing any new onset of "ringing in the ears, or metallic taste in the mouth". No attempts were made at seeking any paresthesias. Safe injection practices and needle disposal techniques used. Medications properly checked for expiration dates. SDV (single dose vial) medications used. After the completion of the procedure, all disposable equipment used was discarded in the proper designated medical waste containers. Local Anesthesia: Protocol guidelines were followed. The patient was positioned over the fluoroscopy table. The area was prepped in the usual manner. The time-out was completed. The target area was identified using fluoroscopy. A 12-in long, straight, sterile hemostat was used with fluoroscopic guidance to locate the targets for each level blocked. Once located, the skin was marked with an approved surgical skin marker. Once all sites were marked, the skin (epidermis, dermis, and hypodermis), as well as deeper tissues (fat, connective tissue and muscle) were infiltrated with a small amount of a short-acting local anesthetic, loaded on a 10cc syringe with a 25G, 1.5-in  Needle. An appropriate amount of time was allowed for local anesthetics to take effect before proceeding to the next step. Local Anesthetic: Lidocaine 2.0% The unused portion of the local anesthetic was discarded in the proper designated containers. Technical description of process:  L2 Medial Branch Nerve Block (MBB): The target area for the L2 medial branch is at the junction of the postero-lateral aspect of the superior articular process and the superior, posterior, and medial edge of the transverse process of L3. Under fluoroscopic guidance, a Quincke needle was inserted until contact was made with os over the superior postero-lateral aspect of the pedicular shadow (target area).  After negative aspiration for blood, 0.5 mL of the nerve block solution was injected without difficulty or complication. The needle was removed intact. L3 Medial Branch Nerve Block (MBB): The target area for the L3 medial branch is at the junction of the postero-lateral aspect of the superior articular process and the superior, posterior, and medial edge of the transverse process of L4. Under fluoroscopic guidance, a Quincke needle was inserted until contact was made with os over the superior postero-lateral aspect of the pedicular shadow (target area). After negative aspiration for blood, 0.5 mL of the nerve block solution was injected without difficulty or complication. The needle was removed intact. L4 Medial Branch Nerve Block (MBB): The target area for the L4 medial branch is at the junction of the postero-lateral aspect of the superior articular process and the superior, posterior, and medial edge of the transverse process of L5. Under fluoroscopic guidance, a Quincke needle was inserted until contact was made with os over the superior postero-lateral aspect of the pedicular shadow (target area). After negative aspiration for blood, 0.5 mL of the nerve block solution was injected without difficulty or complication. The needle was removed intact. L5 Medial Branch Nerve Block (MBB): The target area for the L5 medial branch is at the junction of the postero-lateral aspect of the superior articular process and the superior, posterior, and medial edge of the sacral ala. Under fluoroscopic guidance, a Quincke needle was inserted until contact was made with os over the superior postero-lateral aspect of the pedicular shadow (target area). After negative aspiration for blood, 0.5 mL of the nerve block solution was injected without difficulty or complication. The needle was removed intact. S1 Medial Branch Nerve Block (MBB): The target area for the S1 medial branch is at the posterior and inferior 6  o'clock  position of the L5-S1 facet joint. Under fluoroscopic guidance, the Quincke needle inserted for the L5 MBB was redirected until contact was made with os over the inferior and postero aspect of the sacrum, at the 6 o' clock position under the L5-S1 facet joint (Target area). After negative aspiration for blood, 0.5 mL of the nerve block solution was injected without difficulty or complication. The needle was removed intact.  Once the entire procedure was completed, the treated area was cleaned, making sure to leave some of the prepping solution back to take advantage of its long term bactericidal properties.         Illustration of the posterior view of the lumbar spine and the posterior neural structures. Laminae of L2 through S1 are labeled. DPRL5, dorsal primary ramus of L5; DPRS1, dorsal primary ramus of S1; DPR3, dorsal primary ramus of L3; FJ, facet (zygapophyseal) joint L3-L4; I, inferior articular process of L4; LB1, lateral branch of dorsal primary ramus of L1; IAB, inferior articular branches from L3 medial branch (supplies L4-L5 facet joint); IBP, intermediate branch plexus; MB3, medial branch of dorsal primary ramus of L3; NR3, third lumbar nerve root; S, superior articular process of L5; SAB, superior articular branches from L4 (supplies L4-5 facet joint also); TP3, transverse process of L3.   Facet Joint Innervation (* possible contribution)  L1-2 T12, L1 (L2*)  Medial Branch  L2-3 L1, L2 (L3*)         "          "  L3-4 L2, L3 (L4*)         "          "  L4-5 L3, L4 (L5*)         "          "  L5-S1 L4, L5, S1          "          "    Vitals:   04/28/23 1144 04/28/23 1216 04/28/23 1219 04/28/23 1224  BP: (!) 87/77 (!) 141/74 130/76 136/70  Pulse: 66     Resp:  (!) 21 19 20   Temp: (!) 97.3 F (36.3 C)     TempSrc: Temporal     SpO2: 97% 97% 96% 97%  Weight: 140 lb (63.5 kg)     Height: 5\' 2"  (1.575 m)        End Time: 1222 hrs.  Imaging Guidance (Spinal):          Type  of Imaging Technique: Fluoroscopy Guidance (Spinal) Indication(s): Assistance in needle guidance and placement for procedures requiring needle placement in or near specific anatomical locations not easily accessible without such assistance. Exposure Time: Please see nurses notes. Contrast: None used. Fluoroscopic Guidance: I was personally present during the use of fluoroscopy. "Tunnel Vision Technique" used to obtain the best possible view of the target area. Parallax error corrected before commencing the procedure. "Direction-depth-direction" technique used to introduce the needle under continuous pulsed fluoroscopy. Once target was reached, antero-posterior, oblique, and lateral fluoroscopic projection used confirm needle placement in all planes. Images permanently stored in EMR. Interpretation: No contrast injected. I personally interpreted the imaging intraoperatively. Adequate needle placement confirmed in multiple planes. Permanent images saved into the patient's record.  Post-operative Assessment:  Post-procedure Vital Signs:  Pulse/HCG Rate: 66(!) 59 Temp: (!) 97.3 F (36.3 C) Resp: 20 BP: 136/70 SpO2: 97 %  EBL: None  Complications: No immediate post-treatment complications observed by team, or reported  by patient.  Note: The patient tolerated the entire procedure well. A repeat set of vitals were taken after the procedure and the patient was kept under observation following institutional policy, for this type of procedure. Post-procedural neurological assessment was performed, showing return to baseline, prior to discharge. The patient was provided with post-procedure discharge instructions, including a section on how to identify potential problems. Should any problems arise concerning this procedure, the patient was given instructions to immediately contact us, at any time, without hesitation. In any case, we plan to contact the patient by telephone for a follow-up status report  regarding this interventional procedure.  Comments:  No additional relevant information.  Plan of Care (POC)  Orders:  Orders Placed This Encounter  Procedures   LUMBAR FACET(MEDIAL BRANCH NERVE BLOCK) MBNB    Scheduling Instructions:     Procedure: Lumbar facet block (AKA.: Lumbosacral medial branch nerve block)     Side: Left-sided     Level: L4-5, L5-S1, and TBD Facets (L3, L4, L5, S1, and TBD Medial Branch Nerves)     Sedation: Patient's choice.     Timeframe: Today    Order Specific Question:   Where will this procedure be performed?    Answer:   [facility]   DG [facility] C-ARM 1-60 MIN NO REPORT    Intraoperative interpretation by procedural physician at [facility].    Standing Status:   Standing    Number of Occurrences:   1    Order Specific Question:   Reason for exam:    Answer:   Assistance in needle guidance and placement for procedures requiring needle placement in or near specific anatomical locations not easily accessible without such assistance.   Informed Consent Details: Physician/Practitioner Attestation; Transcribe to consent form and obtain patient signature    Nursing Order: Transcribe to consent form and obtain patient signature. Note: Always confirm laterality of pain with Ms. Hanlon, before procedure.    Order Specific Question:   Physician/Practitioner attestation of informed consent for procedure/surgical case    Answer:   I, the physician/practitioner, attest that I have discussed with the patient the benefits, risks, side effects, alternatives, likelihood of achieving goals and potential problems during recovery for the procedure that I have provided informed consent.    Order Specific Question:   Procedure    Answer:   Lumbar Facet Block  under fluoroscopic guidance    Order Specific Question:   Physician/Practitioner performing the procedure    Answer:   Lydiana Milley A. Laban Emperor MD    Order Specific Question:   Indication/Reason     Answer:   Low Back Pain, with our without leg pain, due to Facet Joint Arthralgia (Joint Pain) Spondylosis (Arthritis of the Spine), without myelopathy or radiculopathy (Nerve Damage).   Provide equipment / supplies at bedside    Procedure tray: "Block Tray" (Disposable  single use) Skin infiltration needle: Regular 1.5-in, 25-G, (x1) Block Needle type: Spinal Amount/quantity: 3 Size: Regular (3.5-inch) Gauge: 22G    Standing Status:   Standing    Number of Occurrences:   1    Order Specific Question:   Specify    Answer:   Block Tray   Follow-up    Schedule Ms. Macfarlane for a post-procedure follow-up evaluation encounter 2 weeks from now.    Standing Status:   Future    Standing Expiration Date:   05/12/2023    Scheduling Instructions:     Schedule follow-up visit on afternoon of procedure  day (T, Th)     Type: Face-to-face (F2F) Post-procedure (PP) evaluation (E/M)     When: 2 weeks from now   Chronic Opioid Analgesic:  Oxycodone IR 5 mg, 1 tab PO daily (maximum of 5 mg/day of oxycodone.) MME/day: 7.5 mg/day.   Medications ordered for procedure: Meds ordered this encounter  Medications   lidocaine (XYLOCAINE) 2 % (with pres) injection 400 mg   pentafluoroprop-tetrafluoroeth (GEBAUERS) aerosol   ropivacaine (PF) 2 mg/mL (0.2%) (NAROPIN) injection 9 mL   triamcinolone acetonide (KENALOG-40) injection 40 mg   Medications administered: We administered lidocaine, pentafluoroprop-tetrafluoroeth, ropivacaine (PF) 2 mg/mL (0.2%), and triamcinolone acetonide.  See the medical record for exact dosing, route, and time of administration.  Follow-up plan:   Return in about 2 weeks (around 05/12/2023) for Proc-day (T,Th), (Face2F), (PPE).       Interventional Therapies  Risk Factors  Complex Considerations:  Advanced age   Planned  Pending:   Diagnostic/therapeutic left L4-5 and L5-S1 lumbar facet MBB    Under consideration:   Diagnostic/therapeutic left L4-5 and L5-S1 lumbar facet MBB   Possible lumbar facet RFA    Completed:   Diagnostic left L5-S1 pseudoarthrosis inj. #1 (01/09/2022) (10/10 to 0/10)  Therapeutic left L4-5 LESI x8 (06/25/2021) (100/100/80/90)  Therapeutic right L4-5 LESI x1 (07/21/2017) (DNR-F/U)  Palliative right L5-S1 LESI x1 (09/27/2015) (100/100/90)  Therapeutic left L5-S1 LESI x1 (02/05/2016) (100/100/100/100)  Diagnostic right SI joint injection x2 (08/25/2019) (100/100/40/30)  Diagnostic left SI joint injection x2 (01/27/2019) (100/100/100/100)  Diagnostic left glenohumeral joint inj. x1 (04/29/2017) (100/100/98/98)  Left biceps muscle trigger point inj. x1 (07/21/2017) (DNR-F/U)  Right PSIS trigger point inj. x1 (07/27/2018) (100/100/50/>50)  Diagnostic right lumbar facet MBB x3 (01/09/2022) (10/10 to 0/10)  Diagnostic left lumbar facet MBB x4 (01/09/2022) (10/10 to 0/10)  Diagnostic left IA hip inj. x1 (01/27/2019) (100/100/100/100)    Completed by other providers:   None at this time   Therapeutic  Palliative (PRN) options:   None established       Recent Visits Date Type Provider Dept  04/22/23 Office Visit Delano Metz, MD [facility]  Showing recent visits within past 90 days and meeting all other requirements Today's Visits Date Type Provider Dept  04/28/23 Procedure visit Delano Metz, MD [facility]  Showing today's visits and meeting all other requirements Future Appointments Date Type Provider Dept  05/20/23 Appointment Delano Metz, MD [facility]  Showing future appointments within next 90 days and meeting all other requirements  Disposition: Discharge home  Discharge (Date  Time): 04/28/2023; 1229 hrs.   Primary Care Physician: Jerl Mina, MD Location: [facility] Note by: Oswaldo Done, MD (TTS technology used. I apologize for any typographical errors that were not detected and corrected.) Date: 04/28/2023; Time: 1:47  PM  Disclaimer:  Medicine is not an Visual merchandiser. The only guarantee in medicine is that nothing is guaranteed. It is important to note that the decision to proceed with this intervention was based on the information collected from the patient. The Data and conclusions were drawn from the patient's questionnaire, the interview, and the physical examination. Because the information was provided in large part by the patient, it cannot be guaranteed that it has not been purposely or unconsciously manipulated. Every effort has been made to obtain as much relevant data as possible for this evaluation. It is important to note that the conclusions that lead to this procedure are derived in large part from the available  data. Always take into account that the treatment will also be dependent on availability of resources and existing treatment guidelines, considered by other Pain Management Practitioners as being common knowledge and practice, at the time of the intervention. For Medico-Legal purposes, it is also important to point out that variation in procedural techniques and pharmacological choices are the acceptable norm. The indications, contraindications, technique, and results of the above procedure should only be interpreted and judged by a Board-Certified Interventional Pain Specialist with extensive familiarity and expertise in the same exact procedure and technique.

## 2023-04-28 ENCOUNTER — Ambulatory Visit
Admission: RE | Admit: 2023-04-28 | Discharge: 2023-04-28 | Disposition: A | Discharge status: Home / Self Care | Payer: PPO | Source: Ambulatory Visit | Attending: Pain Medicine | Admitting: Pain Medicine

## 2023-04-28 ENCOUNTER — Ambulatory Visit: Payer: PPO | Attending: Pain Medicine | Admitting: Pain Medicine

## 2023-04-28 ENCOUNTER — Encounter: Payer: Self-pay | Admitting: Pain Medicine

## 2023-04-28 VITALS — BP 136/70 | HR 66 | Temp 97.3°F | Resp 20 | Ht 62.0 in | Wt 140.0 lb

## 2023-04-28 DIAGNOSIS — M4316 Spondylolisthesis, lumbar region: Secondary | ICD-10-CM

## 2023-04-28 DIAGNOSIS — M47816 Spondylosis without myelopathy or radiculopathy, lumbar region: Secondary | ICD-10-CM | POA: Diagnosis not present

## 2023-04-28 DIAGNOSIS — M47817 Spondylosis without myelopathy or radiculopathy, lumbosacral region: Secondary | ICD-10-CM

## 2023-04-28 DIAGNOSIS — M5136 Other intervertebral disc degeneration, lumbar region: Secondary | ICD-10-CM | POA: Insufficient documentation

## 2023-04-28 DIAGNOSIS — M5459 Other low back pain: Secondary | ICD-10-CM

## 2023-04-28 DIAGNOSIS — S32009K Unspecified fracture of unspecified lumbar vertebra, subsequent encounter for fracture with nonunion: Secondary | ICD-10-CM

## 2023-04-28 DIAGNOSIS — Q7649 Other congenital malformations of spine, not associated with scoliosis: Secondary | ICD-10-CM

## 2023-04-28 DIAGNOSIS — M51369 Other intervertebral disc degeneration, lumbar region without mention of lumbar back pain or lower extremity pain: Secondary | ICD-10-CM

## 2023-04-28 DIAGNOSIS — Z5189 Encounter for other specified aftercare: Secondary | ICD-10-CM | POA: Diagnosis not present

## 2023-04-28 MED ORDER — TRIAMCINOLONE ACETONIDE 40 MG/ML IJ SUSP
40.0000 mg | Freq: Once | INTRAMUSCULAR | Status: AC
  Administered 2023-04-28: 40 mg
  Filled 2023-04-28: qty 1

## 2023-04-28 MED ORDER — ROPIVACAINE HCL 2 MG/ML IJ SOLN
9.0000 mL | Freq: Once | INTRAMUSCULAR | Status: AC
  Administered 2023-04-28: 9 mL via PERINEURAL
  Filled 2023-04-28: qty 20

## 2023-04-28 MED ORDER — LIDOCAINE HCL 2 % IJ SOLN
20.0000 mL | Freq: Once | INTRAMUSCULAR | Status: AC
  Administered 2023-04-28: 200 mg
  Filled 2023-04-28: qty 20

## 2023-04-28 MED ORDER — PENTAFLUOROPROP-TETRAFLUOROETH EX AERO
INHALATION_SPRAY | Freq: Once | CUTANEOUS | Status: AC
  Administered 2023-04-28: 1 via TOPICAL
  Filled 2023-04-28: qty 116

## 2023-04-28 NOTE — Patient Instructions (Signed)

## 2023-04-29 ENCOUNTER — Telehealth: Payer: Self-pay

## 2023-04-29 NOTE — Telephone Encounter (Signed)
Post procedure follow up.  Call being screened.  Unabl;e to get through.

## 2023-05-20 ENCOUNTER — Ambulatory Visit: Payer: PPO | Attending: Pain Medicine | Admitting: Pain Medicine

## 2023-05-20 ENCOUNTER — Encounter: Payer: Self-pay | Admitting: Pain Medicine

## 2023-05-20 DIAGNOSIS — M25551 Pain in right hip: Secondary | ICD-10-CM | POA: Insufficient documentation

## 2023-05-20 DIAGNOSIS — Z79891 Long term (current) use of opiate analgesic: Secondary | ICD-10-CM | POA: Diagnosis not present

## 2023-05-20 DIAGNOSIS — M25552 Pain in left hip: Secondary | ICD-10-CM | POA: Diagnosis not present

## 2023-05-20 DIAGNOSIS — M79605 Pain in left leg: Secondary | ICD-10-CM | POA: Insufficient documentation

## 2023-05-20 DIAGNOSIS — M4316 Spondylolisthesis, lumbar region: Secondary | ICD-10-CM | POA: Diagnosis not present

## 2023-05-20 DIAGNOSIS — Q7649 Other congenital malformations of spine, not associated with scoliosis: Secondary | ICD-10-CM | POA: Insufficient documentation

## 2023-05-20 DIAGNOSIS — M5135 Other intervertebral disc degeneration, thoracolumbar region: Secondary | ICD-10-CM | POA: Insufficient documentation

## 2023-05-20 DIAGNOSIS — M5136 Other intervertebral disc degeneration, lumbar region: Secondary | ICD-10-CM | POA: Diagnosis not present

## 2023-05-20 DIAGNOSIS — G8929 Other chronic pain: Secondary | ICD-10-CM | POA: Diagnosis not present

## 2023-05-20 DIAGNOSIS — M4726 Other spondylosis with radiculopathy, lumbar region: Secondary | ICD-10-CM | POA: Diagnosis not present

## 2023-05-20 DIAGNOSIS — M5416 Radiculopathy, lumbar region: Secondary | ICD-10-CM | POA: Insufficient documentation

## 2023-05-20 DIAGNOSIS — M4155 Other secondary scoliosis, thoracolumbar region: Secondary | ICD-10-CM | POA: Diagnosis not present

## 2023-05-20 DIAGNOSIS — M47816 Spondylosis without myelopathy or radiculopathy, lumbar region: Secondary | ICD-10-CM | POA: Diagnosis not present

## 2023-05-20 DIAGNOSIS — Z79899 Other long term (current) drug therapy: Secondary | ICD-10-CM | POA: Insufficient documentation

## 2023-05-20 DIAGNOSIS — M545 Low back pain, unspecified: Secondary | ICD-10-CM | POA: Diagnosis not present

## 2023-05-20 DIAGNOSIS — M48062 Spinal stenosis, lumbar region with neurogenic claudication: Secondary | ICD-10-CM | POA: Insufficient documentation

## 2023-05-20 DIAGNOSIS — G894 Chronic pain syndrome: Secondary | ICD-10-CM | POA: Diagnosis not present

## 2023-05-20 MED ORDER — OXYCODONE HCL 5 MG PO TABS
5.0000 mg | ORAL_TABLET | Freq: Every day | ORAL | 0 refills | Status: DC | PRN
Start: 2023-07-25 — End: 2023-08-18

## 2023-05-20 MED ORDER — OXYCODONE HCL 5 MG PO TABS
5.0000 mg | ORAL_TABLET | Freq: Every day | ORAL | 0 refills | Status: DC | PRN
Start: 2023-06-25 — End: 2023-08-18

## 2023-05-20 MED ORDER — OXYCODONE HCL 5 MG PO TABS
5.0000 mg | ORAL_TABLET | Freq: Every day | ORAL | 0 refills | Status: DC | PRN
Start: 2023-05-26 — End: 2023-08-18

## 2023-05-20 NOTE — Progress Notes (Signed)
PROVIDER NOTE: Information contained herein reflects review and annotations entered in association with encounter. Interpretation of such information and data should be left to medically-trained personnel. Information provided to patient can be located elsewhere in the medical record under "Patient Instructions". Document created using STT-dictation technology, any transcriptional errors that may result from process are unintentional.    Patient: Cynthia Bennett  Service Category: E/M  Provider: Oswaldo Done, MD  DOB: 1933-10-30  DOS: 05/20/2023  Referring Provider: Jerl Mina, MD  MRN: [id number]  Specialty: Interventional Pain Management  PCP: Jerl Mina, MD  Type: Established Patient  Setting: Ambulatory outpatient    Location: Office  Delivery: Face-to-face     HPI  [name], a 89 y.o. year old female, is here today because of her No primary diagnosis found.. [name] primary complain today is Back Pain  Pertinent problems: [name] has Fibromyalgia; Chronic pain syndrome; Lumbar radicular pain (Bilateral) (R>L); Chronic low back pain (1ry area of Pain) (Bilateral) (L>R) w/o sciatica; Grade 1 spondylolisthesis of L4 over L5; Lumbar discogenic pain syndrome; Lumbar facet syndrome (Bilateral) (L>R); Lumbar facet hypertrophy (Bilateral); Chronic sacroiliac joint pain (Left); Chronic hip pain (Left); Lumbar spondylosis; Abnormal MRI, lumbar spine (06/25/2016); Cervical spondylosis (Anterolisthesis of C5 over C6); Chronic lower extremity pain (2ry area of Pain) (Left); Chronic lumbar radicular pain (Left) (L5 Dermatome); Osteoarthritis of hip (Left); Chronic sacroiliac joint pain (Bilateral) (R>L); Chronic shoulder pain (Left); Osteoarthritis of shoulder (Left); Chronic sacroiliac joint pain (Right); Chronic hip pain (Right); Left upper arm pain; Myofascial pain syndrome (left upper extremity); Acromioclavicular joint pain (Left); Osteoarthritis of AC (acromioclavicular) joint (Left);  Supraspinatus tendon tear, sequela (Left); Spondylosis without myelopathy or radiculopathy, lumbosacral region; Other specified dorsopathies, sacral and sacrococcygeal region; Bertolotti's syndrome (Left); DDD (degenerative disc disease), lumbar; Severe (5-6 mm) L4-5 lumbar spinal stenosis and (8 mm) L3-4 spinal stenosis.; Osteoarthritis of hip (Right); Trigger point with back pain (Right); Chronic myofascial pain; Chronic low back pain (Right) w/o sciatica; Chronic hip pain (3ry area of Pain) (Bilateral) (R>L); Osteoarthritis of hips (Bilateral); Chronic low back pain (Left) w/o sciatica; Thoracic back pain (Left); DDD (degenerative disc disease), thoracic; DDD (degenerative disc disease), thoracolumbar; Scoliosis of thoracolumbar region due to degenerative disease of spine in adult; Weakness of left leg; Numbness and tingling of left leg; Lumbar facet joint pain; and Pseudoarthrosis of lumbar spine on their pertinent problem list. Pain Assessment: Severity of Chronic pain is reported as a  /10. Location: Back Lower/radiates down left leg to knee. Onset: More than a month ago. Quality: Aching, Constant, Sore, Throbbing, Tender. Timing: Constant. Modifying factor(s): rest, heat. Vitals:  height is 5\' 2"  (1.575 m) and weight is 141 lb (64 kg). Her temperature is 97.5 F (36.4 C) (abnormal). Her blood pressure is 144/10 (abnormal) and her pulse is 109 (abnormal). Her respiration is 16 and oxygen saturation is 99%.  BMI: Estimated body mass index is 25.79 kg/m as calculated from the following:   Height as of this encounter: 5\' 2"  (1.575 m).   Weight as of this encounter: 141 lb (64 kg). Last encounter: 04/22/2023. Last procedure: 04/28/2023.  Reason for encounter: medication management.  The patient indicates doing well with the current medication regimen. No adverse reactions or side effects reported to the medications.   The patient indicates that currently she is experiencing absolutely no pain.  She had  100% relief of the pain for the duration of the local anesthetic after the lumbar facet block and once it  wore off she refers having attained an ongoing 80% improvement.  However this is only when she stands up at which time what really happens is that her left leg gets really numb.  Today I have evaluated this left lower extremity numbness which actually goes all the way down to the top of her foot and into the big toe and what appears to be an L5 dermatomal distribution.  She also has similar symptoms over the L4 dermatome with some subjective weakness of the left knee.  Review of the MRI done on October 2023 reveals that the patient has worsening of her L4-5 anterolisthesis with central spinal stenosis and bilateral lateral recess stenosis which would explain the reason why seems to be affecting the L4 and L5 nerve roots.  She confirms that her symptoms worsen when she stands up for any prolonged period time which she describes to be more than 15 minutes.  She denies having any significant pain down the left lower extremity but she is having dermatomal numbness and a certain degree of weakness.  She comes into the office today on a wheelchair.  Today we have talked about the reason for this numbness and possible alternatives and we have decided to proceed with a lumbar epidural steroid injection at the L4-5 level on the left side.  The patient was informed of the plan and they have understood and agreed.  RTCB: 08/24/2023   Post-procedure evaluation   Procedure: Lumbar Facet, Medial Branch Block(s) #5  Laterality: Left  Level: L3, L4, L5, and S1 Medial Branch Level(s). Injecting these levels blocks the L4-5 and L5-S1 lumbar facet joints. Imaging: Fluoroscopic guidance         Anesthesia: Local anesthesia (1-2% Lidocaine) Anxiolysis: None                 Sedation: No Sedation                (did not follow n.p.o. orders) DOS: 04/28/2023 Performed by: Oswaldo Done, MD  Primary Purpose:  Diagnostic/Therapeutic Indications: Low back pain severe enough to impact quality of life or function. 1. Lumbar facet syndrome (Bilateral) (L>R)   2. Lumbar facet hypertrophy (Bilateral)   3. Spondylosis without myelopathy or radiculopathy, lumbosacral region   4. DDD (degenerative disc disease), lumbar   5. Grade 1 spondylolisthesis of L4 over L5   6. Lumbar facet joint pain   7. Bertolotti's syndrome (Left)   8. Pseudoarthrosis of lumbar spine    NAS-11 Pain score:   Pre-procedure: 10-Worst pain ever/10   Post-procedure: 0-No pain/10   Note: Today in addition to blocking the left-sided medial branches for the L4-5 and L5-S1 facets, I also injected the patient's pseudoarthrosis.  In the past this has provided her with considerable relief of the pain.     Effectiveness:  Initial hour after procedure: 100 %. Subsequent 4-6 hours post-procedure: 100 % (10 days). Analgesia past initial 6 hours: 80 % (current). Ongoing improvement:  Analgesic: The patient indicates currently having an ongoing 80% improvement of her low back pain. Function: Ms. Merrick reports improvement in function ROM: Ms. Mcreynolds reports improvement in ROM  Pharmacotherapy Assessment  Analgesic: Oxycodone IR 5 mg, 1 tab PO daily (maximum of 5 mg/day of oxycodone.) MME/day: 7.5 mg/day.   Monitoring: [place] PMP: PDMP reviewed during this encounter.       Pharmacotherapy: No side-effects or adverse reactions reported. Compliance: No problems identified. Effectiveness: Clinically acceptable.  Newman Pies, RN  05/20/2023  1:28 PM  Sign when Signing Visit Nursing Pain Medication Assessment:  Safety precautions to be maintained throughout the outpatient stay will include: orient to surroundings, keep bed in low position, maintain call bell within reach at all times, provide assistance with transfer out of bed and ambulation.  Medication Inspection Compliance: Pill count conducted under aseptic conditions, in front of the  patient. Neither the pills nor the bottle was removed from the patient's sight at any time. Once count was completed pills were immediately returned to the patient in their original bottle.  Medication: Oxycodone IR Pill/Patch Count:  10 of 30 pills remain Pill/Patch Appearance: Markings consistent with prescribed medication Bottle Appearance: Standard pharmacy container. Clearly labeled. Filled Date: 6 / 17 / 2024 Last Medication intake:  Today    No results found for: "CBDTHCR" No results found for: "D8THCCBX" No results found for: "D9THCCBX"  UDS:  Summary  Date Value Ref Range Status  04/22/2023 Note  Final    Comment:    ==================================================================== [employer] Select 13 (MW) ==================================================================== Specimen Alert Not Detected result may be consistent with the time of last use noted for this medication. AS NEEDED (Oxycodone) ==================================================================== Test                             Result       Flag       Units  Drug Absent but Declared for Prescription Verification   Oxycodone                      Not Detected UNEXPECTED ng/mg creat ==================================================================== Test                      Result    Flag   Units      Ref Range   Creatinine              65               mg/dL      >=16 ==================================================================== Declared Medications:  The flagging and interpretation on this report are based on the  following declared medications.  Unexpected results may arise from  inaccuracies in the declared medications.   **Note: The testing scope of this panel includes these medications:   Oxycodone   **Note: The testing scope of this panel does not include the  following reported medications:   Acetaminophen (Tylenol)  Doxepin (Sinequan)  Esomeprazole (Nexium)  Eye Drops   Magnesium (Mag-Ox)  Naloxone (Narcan)  Ondansetron (Zofran)  Sertraline (Zoloft)  Trazodone (Desyrel)  Vitamin D3  Zolpidem (Ambien) ==================================================================== For clinical consultation, please call [phone]. ====================================================================       ROS  Constitutional: Denies any fever or chills Gastrointestinal: No reported hemesis, hematochezia, vomiting, or acute GI distress Musculoskeletal: Denies any acute onset joint swelling, redness, loss of ROM, or weakness Neurological: No reported episodes of acute onset apraxia, aphasia, dysarthria, agnosia, amnesia, paralysis, loss of coordination, or loss of consciousness  Medication Review  Vitamin D3, acetaminophen, cyanocobalamin, dorzolamide-timolol, doxepin, esomeprazole, magnesium oxide, naloxone, ondansetron, oxyCODONE, sertraline, traZODone, and zolpidem  History Review  Allergy: Ms. Postma is allergic to ibuprofen. Drug: Ms. Huebert  reports no history of drug use. Alcohol:  reports no history of alcohol use. Tobacco:  reports that she has quit smoking. She has never used smokeless tobacco. Social: Ms. Short  reports that she has quit smoking. She has never used smokeless tobacco. She reports that  she does not drink alcohol and does not use drugs. Medical:  has a past medical history of Dizzy spells, Fibromyalgia, Glaucoma, Hiatal hernia, Osteoarthritis, Osteoarthritis of spine with radiculopathy, lumbar region (08/14/2015), Peptic ulcer disease, Reflux, Spinal stenosis, Stroke (HCC), and TIA (transient ischemic attack). Surgical: Ms. Mohrmann  has a past surgical history that includes Esophagogastroduodenoscopy (N/A, 04/16/2015); Appendectomy; and Abdominal hysterectomy. Family: family history includes COPD in her father; Mental illness in her mother.  Laboratory Chemistry Profile   Renal Lab Results  Component Value Date   BUN 14 03/13/2023    CREATININE 0.97 03/13/2023   BCR 11 (L) 01/10/2019   GFRAA 46 (L) 01/10/2019   GFRNONAA 57 (L) 03/13/2023    Hepatic Lab Results  Component Value Date   AST 17 07/31/2022   ALT 10 07/31/2022   ALBUMIN 3.6 07/31/2022   ALKPHOS 54 07/31/2022   LIPASE 139 04/27/2014    Electrolytes Lab Results  Component Value Date   NA 137 03/13/2023   K 4.1 03/13/2023   CL 106 03/13/2023   CALCIUM 9.0 03/13/2023   MG 2.3 01/10/2019    Bone Lab Results  Component Value Date   25OHVITD1 26 (L) 01/10/2019   25OHVITD2 1.6 01/10/2019   25OHVITD3 24 01/10/2019    Inflammation (CRP: Acute Phase) (ESR: Chronic Phase) Lab Results  Component Value Date   CRP 0.9 08/24/2020   ESRSEDRATE 51 (H) 01/10/2019         Note: Above Lab results reviewed.  Recent Imaging Review  DG PAIN CLINIC C-ARM 1-60 MIN NO REPORT Fluoro was used, but no Radiologist interpretation will be provided.  Please refer to "NOTES" tab for provider progress note. Note: Reviewed        Physical Exam  General appearance: Well nourished, well developed, and well hydrated. In no apparent acute distress Mental status: Alert, oriented x 3 (person, place, & time)       Respiratory: No evidence of acute respiratory distress Eyes: PERLA Vitals: BP (!) 144/10   Pulse (!) 109   Temp (!) 97.5 F (36.4 C)   Resp 16   Ht 5\' 2"  (1.575 m)   Wt 141 lb (64 kg)   SpO2 99%   BMI 25.79 kg/m  BMI: Estimated body mass index is 25.79 kg/m as calculated from the following:   Height as of this encounter: 5\' 2"  (1.575 m).   Weight as of this encounter: 141 lb (64 kg). Ideal: Ideal body weight: 50.1 kg (110 lb 7.2 oz) Adjusted ideal body weight: 55.6 kg (122 lb 10.7 oz)  Assessment   Diagnosis Status  1. Bertolotti's syndrome (Left)   2. Chronic pain syndrome   3. Pharmacologic therapy   4. DDD (degenerative disc disease), thoracolumbar   5. DDD (degenerative disc disease), lumbar   6. Severe (5-6 mm) L4-5 lumbar spinal stenosis  and (8 mm) L3-4 spinal stenosis.   7. Lumbar facet hypertrophy (Bilateral)   8. Grade 1 spondylolisthesis of L4 over L5   9. Chronic lumbar radicular pain (Left) (L5 Dermatome)   10. Encounter for medication management   11. Encounter for chronic pain management   12. Chronic lower extremity pain (2ry area of Pain) (Left)   13. Chronic use of opiate for therapeutic purpose   14. Chronic low back pain (1ry area of Pain) (Bilateral) (L>R) w/o sciatica   15. Scoliosis of thoracolumbar region due to degenerative disease of spine in adult   16. Chronic hip pain (3ry area of Pain) (Bilateral) (R>L)  Controlled Controlled Controlled   Updated Problems: No problems updated.  Plan of Care  Problem-specific:  No problem-specific Assessment & Plan notes found for this encounter.  Ms. DASHANIQUE BROWNSTEIN has a current medication list which includes the following long-term medication(s): doxepin, [START ON 05/26/2023] oxycodone, [START ON 06/25/2023] oxycodone, [START ON 07/25/2023] oxycodone, trazodone, and zolpidem.  Pharmacotherapy (Medications Ordered): Meds ordered this encounter  Medications   oxyCODONE (OXY IR/ROXICODONE) 5 MG immediate release tablet    Sig: Take 1 tablet (5 mg total) by mouth daily as needed for severe pain. Must last 30 days    Dispense:  30 tablet    Refill:  0    DO NOT: delete (not duplicate); no partial-fill (will deny script to complete), no refill request (F/U required). DISPENSE: 1 day early if closed on fill date. WARN: No CNS-depressants within 8 hrs of med.   oxyCODONE (OXY IR/ROXICODONE) 5 MG immediate release tablet    Sig: Take 1 tablet (5 mg total) by mouth daily as needed for severe pain. Must last 30 days    Dispense:  30 tablet    Refill:  0    DO NOT: delete (not duplicate); no partial-fill (will deny script to complete), no refill request (F/U required). DISPENSE: 1 day early if closed on fill date. WARN: No CNS-depressants within 8 hrs of med.   oxyCODONE  (OXY IR/ROXICODONE) 5 MG immediate release tablet    Sig: Take 1 tablet (5 mg total) by mouth daily as needed for severe pain. Must last 30 days    Dispense:  30 tablet    Refill:  0    DO NOT: delete (not duplicate); no partial-fill (will deny script to complete), no refill request (F/U required). DISPENSE: 1 day early if closed on fill date. WARN: No CNS-depressants within 8 hrs of med.   Orders:  Orders Placed This Encounter  Procedures   Lumbar Epidural Injection    Standing Status:   Future    Standing Expiration Date:   08/20/2023    Scheduling Instructions:     Procedure: Interlaminar Lumbar Epidural Steroid injection (LESI)  L4-5     Laterality: Left-sided     Sedation: Patient's choice.     Timeframe: ASAA    Order Specific Question:   Where will this procedure be performed?    Answer:   [facility]   Follow-up plan:   Return for [facility]): (L) L4-5 LESI #9.      Interventional Therapies  Risk Factors  Complex Considerations:  Advanced age   Planned  Pending:   Diagnostic/therapeutic left L4-5 and L5-S1 lumbar facet MBB    Under consideration:   Diagnostic/therapeutic left L4-5 and L5-S1 lumbar facet MBB  Possible lumbar facet RFA    Completed:   Diagnostic left L5-S1 pseudoarthrosis inj. #1 (01/09/2022) (10/10 to 0/10)  Therapeutic left L4-5 LESI x8 (06/25/2021) (100/100/80/90)  Therapeutic right L4-5 LESI x1 (07/21/2017) (DNR-F/U)  Palliative right L5-S1 LESI x1 (09/27/2015) (100/100/90)  Therapeutic left L5-S1 LESI x1 (02/05/2016) (100/100/100/100)  Diagnostic right SI joint injection x2 (08/25/2019) (100/100/40/30)  Diagnostic left SI joint injection x2 (01/27/2019) (100/100/100/100)  Diagnostic left glenohumeral joint inj. x1 (04/29/2017) (100/100/98/98)  Left biceps muscle trigger point inj. x1 (07/21/2017) (DNR-F/U)  Right PSIS trigger point inj. x1 (07/27/2018) (100/100/50/>50)  Diagnostic right lumbar facet MBB x3 (01/09/2022) (10/10 to 0/10)   Diagnostic left lumbar facet MBB x4 (01/09/2022) (10/10 to 0/10)  Diagnostic left IA hip inj. x1 (01/27/2019) (100/100/100/100)  Completed by other providers:   None at this time   Therapeutic  Palliative (PRN) options:   None established      Recent Visits Date Type Provider Dept  04/28/23 Procedure visit Delano Metz, MD [facility]  04/22/23 Office Visit Delano Metz, MD [facility]  Showing recent visits within past 90 days and meeting all other requirements Today's Visits Date Type Provider Dept  05/20/23 Office Visit Delano Metz, MD [facility]  Showing today's visits and meeting all other requirements Future Appointments No visits were found meeting these conditions. Showing future appointments within next 90 days and meeting all other requirements  I discussed the assessment and treatment plan with the patient. The patient was provided an opportunity to ask questions and all were answered. The patient agreed with the plan and demonstrated an understanding of the instructions.  Patient advised to call back or seek an in-person evaluation if the symptoms or condition worsens.  Duration of encounter: 30 minutes.  Total time on encounter, as per AMA guidelines included both the face-to-face and non-face-to-face time personally spent by the physician and/or other qualified health care professional(s) on the day of the encounter (includes time in activities that require the physician or other qualified health care professional and does not include time in activities normally performed by clinical staff). Physician's time may include the following activities when performed: Preparing to see the patient (e.g., pre-charting review of records, searching for previously ordered imaging, lab work, and nerve conduction tests) Review of prior analgesic pharmacotherapies. Reviewing PMP Interpreting ordered tests (e.g., lab work, imaging,  nerve conduction tests) Performing post-procedure evaluations, including interpretation of diagnostic procedures Obtaining and/or reviewing separately obtained history Performing a medically appropriate examination and/or evaluation Counseling and educating the patient/family/caregiver Ordering medications, tests, or procedures Referring and communicating with other health care professionals (when not separately reported) Documenting clinical information in the electronic or other health record Independently interpreting results (not separately reported) and communicating results to the patient/ family/caregiver Care coordination (not separately reported)  Note by: Oswaldo Done, MD Date: 05/20/2023; Time: 2:17 PM

## 2023-05-20 NOTE — Patient Instructions (Signed)
______________________________________________________________________  Procedure instructions  Do not eat or drink fluids (other than water) for 6 hours before your procedure  No water for 2 hours before your procedure  Take your blood pressure medicine with a sip of water  Arrive 30 minutes before your appointment  Carefully read the "Preparing for your procedure" detailed instructions  If you have questions call us at [phone]  _____________________________________________________________________    ______________________________________________________________________  Preparing for your procedure  Appointments: If you think you may not be able to keep your appointment, call 24-48 hours in advance to cancel. We need time to make it available to others.  During your procedure appointment there will be: No Prescription Refills. No disability issues to discussed. No medication changes or discussions.  Instructions: Food intake: Avoid eating anything solid for at least 8 hours prior to your procedure. Clear liquid intake: You may take clear liquids such as water up to 2 hours prior to your procedure. (No carbonated drinks. No soda.) Transportation: Unless otherwise stated by your physician, bring a driver. Morning Medicines: Except for blood thinners, take all of your other morning medications with a sip of water. Make sure to take your heart and blood pressure medicines. If your blood pressure's lower number is above 100, the case will be rescheduled. Blood thinners: Make sure to stop your blood thinners as instructed.  If you take a blood thinner, but were not instructed to stop it, call our office [phone] and ask to talk to a nurse. Not stopping a blood thinner prior to certain procedures could lead to serious complications. Diabetics on insulin: Notify the staff so that you can be scheduled 1st case in the morning. If your diabetes requires high dose insulin,  take only  of your normal insulin dose the morning of the procedure and notify the staff that you have done so. Preventing infections: Shower with an antibacterial soap the morning of your procedure.  Build-up your immune system: Take 1000 mg of Vitamin C with every meal (3 times a day) the day prior to your procedure. Antibiotics: Inform the nursing staff if you are taking any antibiotics or if you have any conditions that may require antibiotics prior to procedures. (Example: recent joint implants)   Pregnancy: If you are pregnant make sure to notify the nursing staff. Not doing so may result in injury to the fetus, including death.  Sickness: If you have a cold, fever, or any active infections, call and cancel or reschedule your procedure. Receiving steroids while having an infection may result in complications. Arrival: You must be in the facility at least 30 minutes prior to your scheduled procedure. Tardiness: Your scheduled time is also the cutoff time. If you do not arrive at least 15 minutes prior to your procedure, you will be rescheduled.  Children: Do not bring any children with you. Make arrangements to keep them home. Dress appropriately: There is always a possibility that your clothing may get soiled. Avoid long dresses. Valuables: Do not bring any jewelry or valuables.  Reasons to call and reschedule or cancel your procedure: (Following these recommendations will minimize the risk of a serious complication.) Surgeries: Avoid having procedures within 2 weeks of any surgery. (Avoid for 2 weeks before or after any surgery). Flu Shots: Avoid having procedures within 2 weeks of a flu shots or . (Avoid for 2 weeks before or after immunizations). Barium: Avoid having a procedure within 7-10 days after having had a radiological study involving the use of  radiological contrast. (Myelograms, Barium swallow or enema study). Heart attacks: Avoid any elective procedures or surgeries for the  initial 6 months after a "Myocardial Infarction" (Heart Attack). Blood thinners: It is imperative that you stop these medications before procedures. Let us know if you if you take any blood thinner.  Infection: Avoid procedures during or within two weeks of an infection (including chest colds or gastrointestinal problems). Symptoms associated with infections include: Localized redness, fever, chills, night sweats or profuse sweating, burning sensation when voiding, cough, congestion, stuffiness, runny nose, sore throat, diarrhea, nausea, vomiting, cold or Flu symptoms, recent or current infections. It is specially important if the infection is over the area that we intend to treat. Heart and lung problems: Symptoms that may suggest an active cardiopulmonary problem include: cough, chest pain, breathing difficulties or shortness of breath, dizziness, ankle swelling, uncontrolled high or unusually low blood pressure, and/or palpitations. If you are experiencing any of these symptoms, cancel your procedure and contact your primary care physician for an evaluation.  Remember:  Regular Business hours are:  Monday to Thursday 8:00 AM to 4:00 PM  Provider's Schedule: Delano Metz, MD:  Procedure days: Tuesday and Thursday 7:30 AM to 4:00 PM  Edward Jolly, MD:  Procedure days: Monday and Wednesday 7:30 AM to 4:00 PM  ______________________________________________________________________    ____________________________________________________________________________________________  General Risks and Possible Complications  Patient Responsibilities: It is important that you read this as it is part of your informed consent. It is our duty to inform you of the risks and possible complications associated with treatments offered to you. It is your responsibility as a patient to read this and to ask questions about anything that is not clear or that you believe was not covered in this  document.  Patient's Rights: You have the right to refuse treatment. You also have the right to change your mind, even after initially having agreed to have the treatment done. However, under this last option, if you wait until the last second to change your mind, you may be charged for the materials used up to that point.  Introduction: Medicine is not an Visual merchandiser. Everything in Medicine, including the lack of treatment(s), carries the potential for danger, harm, or loss (which is by definition: Risk). In Medicine, a complication is a secondary problem, condition, or disease that can aggravate an already existing one. All treatments carry the risk of possible complications. The fact that a side effects or complications occurs, does not imply that the treatment was conducted incorrectly. It must be clearly understood that these can happen even when everything is done following the highest safety standards.  No treatment: You can choose not to proceed with the proposed treatment alternative. The "PRO(s)" would include: avoiding the risk of complications associated with the therapy. The "CON(s)" would include: not getting any of the treatment benefits. These benefits fall under one of three categories: diagnostic; therapeutic; and/or palliative. Diagnostic benefits include: getting information which can ultimately lead to improvement of the disease or symptom(s). Therapeutic benefits are those associated with the successful treatment of the disease. Finally, palliative benefits are those related to the decrease of the primary symptoms, without necessarily curing the condition (example: decreasing the pain from a flare-up of a chronic condition, such as incurable terminal cancer).  General Risks and Complications: These are associated to most interventional treatments. They can occur alone, or in combination. They fall under one of the following six (6) categories: no benefit or worsening of symptoms;  bleeding; infection; nerve damage; allergic reactions; and/or death. No benefits or worsening of symptoms: In Medicine there are no guarantees, only probabilities. No healthcare provider can ever guarantee that a medical treatment will work, they can only state the probability that it may. Furthermore, there is always the possibility that the condition may worsen, either directly, or indirectly, as a consequence of the treatment. Bleeding: This is more common if the patient is taking a blood thinner, either prescription or over the counter (example: Goody Powders, Fish oil, Aspirin, Garlic, etc.), or if suffering a condition associated with impaired coagulation (example: Hemophilia, cirrhosis of the liver, low platelet counts, etc.). However, even if you do not have one on these, it can still happen. If you have any of these conditions, or take one of these drugs, make sure to notify your treating physician. Infection: This is more common in patients with a compromised immune system, either due to disease (example: diabetes, cancer, human immunodeficiency virus [HIV], etc.), or due to medications or treatments (example: therapies used to treat cancer and rheumatological diseases). However, even if you do not have one on these, it can still happen. If you have any of these conditions, or take one of these drugs, make sure to notify your treating physician. Nerve Damage: This is more common when the treatment is an invasive one, but it can also happen with the use of medications, such as those used in the treatment of cancer. The damage can occur to small secondary nerves, or to large primary ones, such as those in the spinal cord and brain. This damage may be temporary or permanent and it may lead to impairments that can range from temporary numbness to permanent paralysis and/or brain death. Allergic Reactions: Any time a substance or material comes in contact with our body, there is the possibility of an  allergic reaction. These can range from a mild skin rash (contact dermatitis) to a severe systemic reaction (anaphylactic reaction), which can result in death. Death: In general, any medical intervention can result in death, most of the time due to an unforeseen complication. ____________________________________________________________________________________________    ____________________________________________________________________________________________  Opioid Pain Medication Update  To: All patients taking opioid pain medications. (I.e.: hydrocodone, hydromorphone, oxycodone, oxymorphone, morphine, codeine, methadone, tapentadol, tramadol, buprenorphine, fentanyl, etc.)  Re: Updated review of side effects and adverse reactions of opioid analgesics, as well as new information about long term effects of this class of medications.  Direct risks of long-term opioid therapy are not limited to opioid addiction and overdose. Potential medical risks include serious fractures, breathing problems during sleep, hyperalgesia, immunosuppression, chronic constipation, bowel obstruction, myocardial infarction, and tooth decay secondary to xerostomia.  Unpredictable adverse effects that can occur even if you take your medication correctly: Cognitive impairment, respiratory depression, and death. Most people think that if they take their medication "correctly", and "as instructed", that they will be safe. Nothing could be farther from the truth. In reality, a significant amount of recorded deaths associated with the use of opioids has occurred in individuals that had taken the medication for a long time, and were taking their medication correctly. The following are examples of how this can happen: Patient taking his/her medication for a long time, as instructed, without any side effects, is given a certain antibiotic or another unrelated medication, which in turn triggers a "Drug-to-drug interaction"  leading to disorientation, cognitive impairment, impaired reflexes, respiratory depression or an untoward event leading to serious bodily harm or injury, including death.  Patient taking  his/her medication for a long time, as instructed, without any side effects, develops an acute impairment of liver and/or kidney function. This will lead to a rapid inability of the body to breakdown and eliminate their pain medication, which will result in effects similar to an "overdose", but with the same medicine and dose that they had always taken. This again may lead to disorientation, cognitive impairment, impaired reflexes, respiratory depression or an untoward event leading to serious bodily harm or injury, including death.  A similar problem will occur with patients as they grow older and their liver and kidney function begins to decrease as part of the aging process.  Background information: Historically, the original case for using long-term opioid therapy to treat chronic noncancer pain was based on safety assumptions that subsequent experience has called into question. In 1996, the American Pain Society and the American Academy of Pain Medicine issued a consensus statement supporting long-term opioid therapy. This statement acknowledged the dangers of opioid prescribing but concluded that the risk for addiction was low; respiratory depression induced by opioids was short-lived, occurred mainly in opioid-naive patients, and was antagonized by pain; tolerance was not a common problem; and efforts to control diversion should not constrain opioid prescribing. This has now proven to be wrong. Experience regarding the risks for opioid addiction, misuse, and overdose in community practice has failed to support these assumptions.  According to the Centers for Disease Control and Prevention, fatal overdoses involving opioid analgesics have increased sharply over the past decade. Currently, more than 96,700 people die  from drug overdoses every year. Opioids are a factor in 7 out of every 10 overdose deaths. Deaths from drug overdose have surpassed motor vehicle accidents as the leading cause of death for individuals between the ages of 64 and 43.  Clinical data suggest that neuroendocrine dysfunction may be very common in both men and women, potentially causing hypogonadism, erectile dysfunction, infertility, decreased libido, osteoporosis, and depression. Recent studies linked higher opioid dose to increased opioid-related mortality. Controlled observational studies reported that long-term opioid therapy may be associated with increased risk for cardiovascular events. Subsequent meta-analysis concluded that the safety of long-term opioid therapy in elderly patients has not been proven.   Side Effects and adverse reactions: Common side effects: Drowsiness (sedation). Dizziness. Nausea and vomiting. Constipation. Physical dependence -- Dependence often manifests with withdrawal symptoms when opioids are discontinued or decreased. Tolerance -- As you take repeated doses of opioids, you require increased medication to experience the same effect of pain relief. Respiratory depression -- This can occur in healthy people, especially with higher doses. However, people with COPD, asthma or other lung conditions may be even more susceptible to fatal respiratory impairment.  Uncommon side effects: An increased sensitivity to feeling pain and extreme response to pain (hyperalgesia). Chronic use of opioids can lead to this. Delayed gastric emptying (the process by which the contents of your stomach are moved into your small intestine). Muscle rigidity. Immune system and hormonal dysfunction. Quick, involuntary muscle jerks (myoclonus). Arrhythmia. Itchy skin (pruritus). Dry mouth (xerostomia).  Long-term side effects: Chronic constipation. Sleep-disordered breathing (SDB). Increased risk of bone  fractures. Hypothalamic-pituitary-adrenal dysregulation. Increased risk of overdose.  RISKS: Respiratory depression and death: Opioids increase the risk of respiratory depression and death.  Drug-to-drug interactions: Opioids are relatively contraindicated in combination with benzodiazepines, sleep inducers, and other central nervous system depressants. Other classes of medications (i.e.: certain antibiotics and even over-the-counter medications) may also trigger or induce respiratory depression in some patients.  Medical conditions: Patients with pre-existing respiratory problems are at higher risk of respiratory failure and/or depression when in combination with opioid analgesics. Opioids are relatively contraindicated in some medical conditions such as central sleep apnea.   Fractures and Falls:  Opioids increase the risk and incidence of falls. This is of particular importance in elderly patients.  Endocrine System:  Long-term administration is associated with endocrine abnormalities (endocrinopathies). (Also known as Opioid-induced Endocrinopathy) Influences on both the hypothalamic-pituitary-adrenal axis?and the hypothalamic-pituitary-gonadal axis have been demonstrated with consequent hypogonadism and adrenal insufficiency in both sexes. Hypogonadism and decreased levels of dehydroepiandrosterone sulfate have been reported in men and women. Endocrine effects include: Amenorrhoea in women (abnormal absence of menstruation) Reduced libido in both sexes Decreased sexual function Erectile dysfunction in men Hypogonadisms (decreased testicular function with shrinkage of testicles) Infertility Depression and fatigue Loss of muscle mass Anxiety Depression Immune suppression Hyperalgesia Weight gain Anemia Osteoporosis Patients (particularly women of childbearing age) should avoid opioids. There is insufficient evidence to recommend routine monitoring of asymptomatic patients taking  opioids in the long-term for hormonal deficiencies.  Immune System: Human studies have demonstrated that opioids have an immunomodulating effect. These effects are mediated via opioid receptors both on immune effector cells and in the central nervous system. Opioids have been demonstrated to have adverse effects on antimicrobial response and anti-tumour surveillance. Buprenorphine has been demonstrated to have no impact on immune function.  Opioid Induced Hyperalgesia: Human studies have demonstrated that prolonged use of opioids can lead to a state of abnormal pain sensitivity, sometimes called opioid induced hyperalgesia (OIH). Opioid induced hyperalgesia is not usually seen in the absence of tolerance to opioid analgesia. Clinically, hyperalgesia may be diagnosed if the patient on long-term opioid therapy presents with increased pain. This might be qualitatively and anatomically distinct from pain related to disease progression or to breakthrough pain resulting from development of opioid tolerance. Pain associated with hyperalgesia tends to be more diffuse than the pre-existing pain and less defined in quality. Management of opioid induced hyperalgesia requires opioid dose reduction.  Cancer: Chronic opioid therapy has been associated with an increased risk of cancer among noncancer patients with chronic pain. This association was more evident in chronic strong opioid users. Chronic opioid consumption causes significant pathological changes in the small intestine and colon. Epidemiological studies have found that there is a link between opium dependence and initiation of gastrointestinal cancers. Cancer is the second leading cause of death after cardiovascular disease. Chronic use of opioids can cause multiple conditions such as GERD, immunosuppression and renal damage as well as carcinogenic effects, which are associated with the incidence of cancers.   Mortality: Long-term opioid use has been  associated with increased mortality among patients with chronic non-cancer pain (CNCP).  Prescription of long-acting opioids for chronic noncancer pain was associated with a significantly increased risk of all-cause mortality, including deaths from causes other than overdose.  Reference: Von Korff M, Kolodny A, Deyo RA, Chou R. Long-term opioid therapy reconsidered. Ann Intern Med. 2011 Sep 6;155(5):325-8. doi: 10.7326/[id number]. PMID: 16109604; PMCID: [id number]. Randon Goldsmith, Hayward RA, Dunn KM, [place] KP. Risk of adverse events in patients prescribed long-term opioids: A cohort study in the [place] [facility] [employer]. Eur J Pain. 2019 May;23(5):908-922. doi: 10.1002/ejp.1357. Epub 2019 Jan 31. PMID: 78295621. Colameco S, Coren JS, Ciervo CA. Continuous opioid treatment for chronic noncancer pain: a time for moderation in prescribing. Postgrad Med. 2009 Jul;121(4):61-6. doi: 10.3810/pgm.2009.07.2032. PMID: 30865784. William Hamburger  RN, Lendell Caprice SD, Blazina I, Cristopher Peru, Bougatsos C, Deyo RA. The effectiveness and risks of long-term opioid therapy for chronic pain: a systematic review for a [employer] [facility] [employer] to [employer]. Ann Intern Med. 2015 Feb 17;162(4):276-86. doi: 10.7326/M14-2559. PMID: 16109604. Caryl Bis [facility], Makuc DM. NCHS Data Brief No. 22. [place]: Centers for Disease Control and Prevention; 2009. Sep, Increase in Fatal Poisonings Involving Opioid Analgesics in the [place], 1999-2006. Song IA, Choi HR, Oh TK. Long-term opioid use and mortality in patients with chronic non-cancer pain: Ten-year follow-up study in [place] from 2010 through 2019. EClinicalMedicine. 2022 Jul 18;51:101558. doi: 10.1016/j.eclinm.2022.540981. PMID: 19147829; PMCID: [id number]. Huser, W., Schubert, T., Vogelmann, T. et al. All-cause mortality in patients with long-term opioid therapy compared with  non-opioid analgesics for chronic non-cancer pain: a database study. BMC Med 18, 162 (2020). http://lester.info/ Rashidian H, Karie Kirks, Malekzadeh R, Haghdoost AA. An Ecological Study of the Association between Opiate Use and Incidence of Cancers. Addict Health. 2016 Fall;8(4):252-260. PMID: 84696295; PMCID: [id number].  Our Goal: Our goal is to control your pain with means other than the use of opioid pain medications.  Our Recommendation: Talk to your physician about coming off of these medications. We can assist you with the tapering down and stopping these medicines. Based on the new information, even if you cannot completely stop the medication, a decrease in the dose may be associated with a lesser risk. Ask for other means of controlling the pain. Decrease or eliminate those factors that significantly contribute to your pain such as smoking, obesity, and a diet heavily tilted towards "inflammatory" nutrients.  Last Updated: 05/18/2023   ____________________________________________________________________________________________     ____________________________________________________________________________________________  Transfer of Pain Medication between Pharmacies  Re: 2023 DEA Clarification on existing regulation  Published on DEA Website: July 11, 2022  Title: Revised Regulation Allows DEA-Registered Pharmacies to Electrical engineer Prescriptions at a Patient's Request DEA Headquarters Division - [employer]  "Patients now have the ability to request their electronic prescription be transferred to another pharmacy without having to go back to their practitioner to initiate the request. This revised regulation went into effect on Monday, July 07, 2022.     At a patient's request, a DEA-registered retail pharmacy can now transfer an electronic prescription for a controlled substance (schedules II-V) to another  DEA-registered retail pharmacy. Prior to this change, patients would have to go through their practitioner to cancel their prescription and have it re-issued to a different pharmacy. The process was taxing and time consuming for both patients and practitioners.    The Drug Enforcement Administration [employer]) published its intent to revise the process for transferring electronic prescriptions on September 28, 2020.  The final rule was published in the federal register on June 05, 2022 and went into effect 30 days later.  Under the final rule, a prescription can only be transferred once between pharmacies, and only if allowed under existing state or other applicable law. The prescription must remain in its electronic form; may not be altered in any way; and the transfer must be communicated directly between two licensed pharmacists. It's important to note, any authorized refills transfer with the original prescription, which means the entire prescription will be filled at the same pharmacy."    REFERENCES: 1. DEA website announcement HugeHand.[email or web address]  2. Department of Justice website  [email or web address]  3. DEPARTMENT OF JUSTICE Drug Enforcement Administration 21 CFR Part 1306 [Docket No. [id number]] RIN 1117-AB64 "Transfer of Electronic Prescriptions for  Schedules II-V Controlled Substances Between Pharmacies for Initial Filling"  ____________________________________________________________________________________________     _______________________________________________________________________  Medication Rules  Purpose: To inform patients, and their family members, of our medication rules and regulations.  Applies to: All patients receiving prescriptions from our practice (written or electronic).  Pharmacy of record: This is the pharmacy where  your electronic prescriptions will be sent. Make sure we have the correct one.  Electronic prescriptions: In compliance with the [facility] Strengthen Opioid Misuse Prevention (STOP) Act of 2017 (Session Conni Elliot [phone]), effective November 10, 2018, all controlled substances must be electronically prescribed. Written prescriptions, faxing, or calling prescriptions to a pharmacy will no longer be done.  Prescription refills: These will be provided only during in-person appointments. No medications will be renewed without a "face-to-face" evaluation with your provider. Applies to all prescriptions.  NOTE: The following applies primarily to controlled substances (Opioid* Pain Medications).   Type of encounter (visit): For patients receiving controlled substances, face-to-face visits are required. (Not an option and not up to the patient.)  Patient's responsibilities: Pain Pills: Bring all pain pills to every appointment (except for procedure appointments). Pill Bottles: Bring pills in original pharmacy bottle. Bring bottle, even if empty. Always bring the bottle of the most recent fill.  Medication refills: You are responsible for knowing and keeping track of what medications you are taking and when is it that you will need a refill. The day before your appointment: write a list of all prescriptions that need to be refilled. The day of the appointment: give the list to the admitting nurse. Prescriptions will be written only during appointments. No prescriptions will be written on procedure days. If you forget a medication: it will not be "Called in", "Faxed", or "electronically sent". You will need to get another appointment to get these prescribed. No early refills. Do not call asking to have your prescription filled early. Partial  or short prescriptions: Occasionally your pharmacy may not have enough pills to fill your prescription.  NEVER ACCEPT a partial fill or a prescription that is short  of the total amount of pills that you were prescribed.  With controlled substances the law allows 72 hours for the pharmacy to complete the prescription.  If the prescription is not completed within 72 hours, the pharmacist will require a new prescription to be written. This means that you will be short on your medicine and we WILL NOT send another prescription to complete your original prescription.  Instead, request the pharmacy to send a carrier to a nearby branch to get enough medication to provide you with your full prescription. Prescription Accuracy: You are responsible for carefully inspecting your prescriptions before leaving our office. Have the discharge nurse carefully go over each prescription with you, before taking them home. Make sure that your name is accurately spelled, that your address is correct. Check the name and dose of your medication to make sure it is accurate. Check the number of pills, and the written instructions to make sure they are clear and accurate. Make sure that you are given enough medication to last until your next medication refill appointment. Taking Medication: Take medication as prescribed. When it comes to controlled substances, taking less pills or less frequently than prescribed is permitted and encouraged. Never take more pills than instructed. Never take the medication more frequently than prescribed.  Inform other Doctors: Always inform, all of your healthcare providers, of all the medications you take. Pain Medication from other Providers: You are not allowed  to accept any additional pain medication from any other Doctor or Healthcare provider. There are two exceptions to this rule. (see below) In the event that you require additional pain medication, you are responsible for notifying us, as stated below. Cough Medicine: Often these contain an opioid, such as codeine or hydrocodone. Never accept or take cough medicine containing these opioids if you are  already taking an opioid* medication. The combination may cause respiratory failure and death. Medication Agreement: You are responsible for carefully reading and following our Medication Agreement. This must be signed before receiving any prescriptions from our practice. Safely store a copy of your signed Agreement. Violations to the Agreement will result in no further prescriptions. (Additional copies of our Medication Agreement are available upon request.) Laws, Rules, & Regulations: All patients are expected to follow all [address] and [employer], Rules, [place]. Ignorance of the Laws does not constitute a valid excuse.  Illegal drugs and Controlled Substances: The use of illegal substances (including, but not limited to marijuana and its derivatives) and/or the illegal use of any controlled substances is strictly prohibited. Violation of this rule may result in the immediate and permanent discontinuation of any and all prescriptions being written by our practice. The use of any illegal substances is prohibited. Adopted CDC guidelines & recommendations: Target dosing levels will be at or below 60 MME/day. Use of benzodiazepines** is not recommended.  Exceptions: There are only two exceptions to the rule of not receiving pain medications from other Healthcare Providers. Exception #1 (Emergencies): In the event of an emergency (i.e.: accident requiring emergency care), you are allowed to receive additional pain medication. However, you are responsible for: As soon as you are able, call our office [phone], at any time of the day or night, and leave a message stating your name, the date and nature of the emergency, and the name and dose of the medication prescribed. In the event that your call is answered by a member of our staff, make sure to document and save the date, time, and the name of the person that took your information.  Exception #2 (Planned Surgery): In the event that you  are scheduled by another doctor or dentist to have any type of surgery or procedure, you are allowed (for a period no longer than 30 days), to receive additional pain medication, for the acute post-op pain. However, in this case, you are responsible for picking up a copy of our "Post-op Pain Management for Surgeons" handout, and giving it to your surgeon or dentist. This document is available at our office, and does not require an appointment to obtain it. Simply go to our office during business hours (Monday-Thursday from 8:00 AM to 4:00 PM) (Friday 8:00 AM to 12:00 Noon) or if you have a scheduled appointment with [place], prior to your surgery, and ask for it by name. In addition, you are responsible for: calling our office [phone], at any time of the day or night, and leaving a message stating your name, name of your surgeon, type of surgery, and date of procedure or surgery. Failure to comply with your responsibilities may result in termination of therapy involving the controlled substances. Medication Agreement Violation. Following the above rules, including your responsibilities will help you in avoiding a Medication Agreement Violation ("Breaking your Pain Medication Contract").  Consequences:  Not following the above rules may result in permanent discontinuation of medication prescription therapy.  *Opioid medications include: morphine, codeine, oxycodone, oxymorphone, hydrocodone, hydromorphone, meperidine,  tramadol, tapentadol, buprenorphine, fentanyl, methadone. **Benzodiazepine medications include: diazepam (Valium), alprazolam (Xanax), clonazepam (Klonopine), lorazepam (Ativan), clorazepate (Tranxene), chlordiazepoxide (Librium), estazolam (Prosom), oxazepam (Serax), temazepam (Restoril), triazolam (Halcion) (Last updated: 09/02/2022) ______________________________________________________________________     ______________________________________________________________________  Medication Recommendations and Reminders  Applies to: All patients receiving prescriptions (written and/or electronic).  Medication Rules & Regulations: You are responsible for reading, knowing, and following our "Medication Rules" document. These exist for your safety and that of others. They are not flexible and neither are we. Dismissing or ignoring them is an act of "non-compliance" that may result in complete and irreversible termination of such medication therapy. For safety reasons, "non-compliance" will not be tolerated. As with the [place] fundamental legal principle of "ignorance of the law is no defense", we will accept no excuses for not having read and knowing the content of documents provided to you by our practice.  Pharmacy of record:  Definition: This is the pharmacy where your electronic prescriptions will be sent.  We do not endorse any particular pharmacy. It is up to you and your insurance to decide what pharmacy to use.  We do not restrict you in your choice of pharmacy. However, once we write for your prescriptions, we will NOT be re-sending more prescriptions to fix restricted supply problems created by your pharmacy, or your insurance.  The pharmacy listed in the electronic medical record should be the one where you want electronic prescriptions to be sent. If you choose to change pharmacy, simply notify our nursing staff. Changes will be made only during your regular appointments and not over the phone.  Recommendations: Keep all of your pain medications in a safe place, under lock and key, even if you live alone. We will NOT replace lost, stolen, or damaged medication. We do not accept "Police Reports" as proof of medications having been stolen. After you fill your prescription, take 1 week's worth of pills and put them away in a safe place. You should keep a separate, properly labeled bottle for this  purpose. The remainder should be kept in the original bottle. Use this as your primary supply, until it runs out. Once it's gone, then you know that you have 1 week's worth of medicine, and it is time to come in for a prescription refill. If you do this correctly, it is unlikely that you will ever run out of medicine. To make sure that the above recommendation works, it is very important that you make sure your medication refill appointments are scheduled at least 1 week before you run out of medicine. To do this in an effective manner, make sure that you do not leave the office without scheduling your next medication management appointment. Always ask the nursing staff to show you in your prescription , when your medication will be running out. Then arrange for the receptionist to get you a return appointment, at least 7 days before you run out of medicine. Do not wait until you have 1 or 2 pills left, to come in. This is very poor planning and does not take into consideration that we may need to cancel appointments due to bad weather, sickness, or emergencies affecting our staff. DO NOT ACCEPT A "Partial Fill": If for any reason your pharmacy does not have enough pills/tablets to completely fill or refill your prescription, do not allow for a "partial fill". The law allows the pharmacy to complete that prescription within 72 hours, without requiring a new prescription. If they do not fill the rest of  your prescription within those 72 hours, you will need a separate prescription to fill the remaining amount, which we will NOT provide. If the reason for the partial fill is your insurance, you will need to talk to the pharmacist about payment alternatives for the remaining tablets, but again, DO NOT ACCEPT A PARTIAL FILL, unless you can trust your pharmacist to obtain the remainder of the pills within 72 hours.  Prescription refills and/or changes in medication(s):  Prescription refills, and/or changes in dose  or medication, will be conducted only during scheduled medication management appointments. (Applies to both, written and electronic prescriptions.) No refills on procedure days. No medication will be changed or started on procedure days. No changes, adjustments, and/or refills will be conducted on a procedure day. Doing so will interfere with the diagnostic portion of the procedure. No phone refills. No medications will be "called into the pharmacy". No Fax refills. No weekend refills. No Holliday refills. No after hours refills.  Remember:  Business hours are:  Monday to Thursday 8:00 AM to 4:00 PM Provider's Schedule: Delano Metz, MD - Appointments are:  Medication management: Monday and Wednesday 8:00 AM to 4:00 PM Procedure day: Tuesday and Thursday 7:30 AM to 4:00 PM Edward Jolly, MD - Appointments are:  Medication management: Tuesday and Thursday 8:00 AM to 4:00 PM Procedure day: Monday and Wednesday 7:30 AM to 4:00 PM (Last update: 09/02/2022) ______________________________________________________________________   ____________________________________________________________________________________________  Naloxone Nasal Spray  Why am I receiving this medication? [place] STOP ACT requires that all patients taking high dose opioids or at risk of opioids respiratory depression, be prescribed an opioid reversal agent, such as Naloxone (AKA: Narcan).  What is this medication? NALOXONE (nal OX one) treats opioid overdose, which causes slow or shallow breathing, severe drowsiness, or trouble staying awake. Call emergency services after using this medication. You may need additional treatment. Naloxone works by reversing the effects of opioids. It belongs to a group of medications called opioid blockers.  COMMON BRAND NAME(S): Kloxxado, Narcan  What should I tell my care team before I take this medication? They need to know if you have any of these conditions: Heart  disease Substance use disorder An unusual or allergic reaction to naloxone, other medications, foods, dyes, or preservatives Pregnant or trying to get pregnant Breast-feeding  When to use this medication? This medication is to be used for the treatment of respiratory depression (less than 8 breaths per minute) secondary to opioid overdose.   How to use this medication? This medication is for use in the nose. Lay the person on their back. Support their neck with your hand and allow the head to tilt back before giving the medication. The nasal spray should be given into 1 nostril. After giving the medication, move the person onto their side. Do not remove or test the nasal spray until ready to use. Get emergency medical help right away after giving the first dose of this medication, even if the person wakes up. You should be familiar with how to recognize the signs and symptoms of a narcotic overdose. If more doses are needed, give the additional dose in the other nostril. Talk to your care team about the use of this medication in children. While this medication may be prescribed for children as young as newborns for selected conditions, precautions do apply.  Naloxone Overdosage: If you think you have taken too much of this medicine contact a poison control center or emergency room at once.  NOTE: This medicine  is only for you. Do not share this medicine with others.  What if I miss a dose? This does not apply.  What may interact with this medication? This is only used during an emergency. No interactions are expected during emergency use. This list may not describe all possible interactions. Give your health care provider a list of all the medicines, herbs, non-prescription drugs, or dietary supplements you use. Also tell them if you smoke, drink alcohol, or use illegal drugs. Some items may interact with your medicine.  What should I watch for while using this medication? Keep this  medication ready for use in the case of an opioid overdose. Make sure that you have the phone number of your care team and local hospital ready. You may need to have additional doses of this medication. Each nasal spray contains a single dose. Some emergencies may require additional doses. After use, bring the treated person to the nearest hospital or call 911. Make sure the treating care team knows that the person has received a dose of this medication. You will receive additional instructions on what to do during and after use of this medication before an emergency occurs.  What side effects may I notice from receiving this medication? Side effects that you should report to your care team as soon as possible: Allergic reactions--skin rash, itching, hives, swelling of the face, lips, tongue, or throat Side effects that usually do not require medical attention (report these to your care team if they continue or are bothersome): Constipation Dryness or irritation inside the nose Headache Increase in blood pressure Muscle spasms Stuffy nose Toothache This list may not describe all possible side effects. Call your doctor for medical advice about side effects. You may report side effects to FDA at 1-800-FDA-1088.  Where should I keep my medication? Because this is an emergency medication, you should keep it with you at all times.  Keep out of the reach of children and pets. Store between 20 and 25 degrees C (68 and 77 degrees F). Do not freeze. Throw away any unused medication after the expiration date. Keep in original box until ready to use.  NOTE: This sheet is a summary. It may not cover all possible information. If you have questions about this medicine, talk to your doctor, pharmacist, or health care provider.   2023 Elsevier/Gold Standard (2021-07-05 00:00:00)  ____________________________________________________________________________________________

## 2023-05-20 NOTE — Progress Notes (Signed)
Nursing Pain Medication Assessment:  Safety precautions to be maintained throughout the outpatient stay will include: orient to surroundings, keep bed in low position, maintain call bell within reach at all times, provide assistance with transfer out of bed and ambulation.  Medication Inspection Compliance: Pill count conducted under aseptic conditions, in front of the patient. Neither the pills nor the bottle was removed from the patient's sight at any time. Once count was completed pills were immediately returned to the patient in their original bottle.  Medication: Oxycodone IR Pill/Patch Count:  10 of 30 pills remain Pill/Patch Appearance: Markings consistent with prescribed medication Bottle Appearance: Standard pharmacy container. Clearly labeled. Filled Date: 6 / 17 / 2024 Last Medication intake:  Today

## 2023-05-26 ENCOUNTER — Encounter: Payer: Self-pay | Admitting: Pain Medicine

## 2023-05-26 ENCOUNTER — Ambulatory Visit: Admission: RE | Admit: 2023-05-26 | Payer: PPO | Source: Ambulatory Visit

## 2023-05-26 ENCOUNTER — Ambulatory Visit: Payer: PPO | Attending: Pain Medicine | Admitting: Pain Medicine

## 2023-05-26 VITALS — BP 138/65 | HR 80 | Temp 98.1°F | Resp 15 | Ht 61.0 in | Wt 140.0 lb

## 2023-05-26 DIAGNOSIS — M5136 Other intervertebral disc degeneration, lumbar region: Secondary | ICD-10-CM | POA: Insufficient documentation

## 2023-05-26 DIAGNOSIS — M48062 Spinal stenosis, lumbar region with neurogenic claudication: Secondary | ICD-10-CM | POA: Diagnosis not present

## 2023-05-26 DIAGNOSIS — M5416 Radiculopathy, lumbar region: Secondary | ICD-10-CM | POA: Insufficient documentation

## 2023-05-26 DIAGNOSIS — M4316 Spondylolisthesis, lumbar region: Secondary | ICD-10-CM | POA: Insufficient documentation

## 2023-05-26 DIAGNOSIS — M79605 Pain in left leg: Secondary | ICD-10-CM | POA: Insufficient documentation

## 2023-05-26 DIAGNOSIS — R29898 Other symptoms and signs involving the musculoskeletal system: Secondary | ICD-10-CM | POA: Diagnosis not present

## 2023-05-26 DIAGNOSIS — G8929 Other chronic pain: Secondary | ICD-10-CM | POA: Insufficient documentation

## 2023-05-26 MED ORDER — LIDOCAINE HCL 2 % IJ SOLN
20.0000 mL | Freq: Once | INTRAMUSCULAR | Status: AC
  Administered 2023-05-26: 400 mg
  Filled 2023-05-26: qty 20

## 2023-05-26 MED ORDER — PENTAFLUOROPROP-TETRAFLUOROETH EX AERO
INHALATION_SPRAY | Freq: Once | CUTANEOUS | Status: AC
  Administered 2023-05-26: 30 via TOPICAL
  Filled 2023-05-26: qty 116

## 2023-05-26 MED ORDER — IOHEXOL 180 MG/ML  SOLN
10.0000 mL | Freq: Once | INTRAMUSCULAR | Status: AC
  Administered 2023-05-26: 10 mL via EPIDURAL
  Filled 2023-05-26: qty 20

## 2023-05-26 MED ORDER — ROPIVACAINE HCL 2 MG/ML IJ SOLN
2.0000 mL | Freq: Once | INTRAMUSCULAR | Status: AC
  Administered 2023-05-26: 2 mL via EPIDURAL
  Filled 2023-05-26: qty 20

## 2023-05-26 MED ORDER — MIDAZOLAM HCL 2 MG/2ML IJ SOLN: 0.5000 mg | Freq: Once | INTRAMUSCULAR | Status: DC

## 2023-05-26 MED ORDER — LACTATED RINGERS IV SOLN: Freq: Once | INTRAVENOUS | Status: DC

## 2023-05-26 MED ORDER — TRIAMCINOLONE ACETONIDE 40 MG/ML IJ SUSP
40.0000 mg | Freq: Once | INTRAMUSCULAR | Status: AC
  Administered 2023-05-26: 40 mg
  Filled 2023-05-26: qty 1

## 2023-05-26 MED ORDER — SODIUM CHLORIDE 0.9% FLUSH
2.0000 mL | Freq: Once | INTRAVENOUS | Status: AC
  Administered 2023-05-26: 2 mL

## 2023-05-26 NOTE — Progress Notes (Signed)
PROVIDER NOTE: Interpretation of information contained herein should be left to medically-trained personnel. Specific patient instructions are provided elsewhere under "Patient Instructions" section of medical record. This document was created in part using STT-dictation technology, any transcriptional errors that may result from this process are unintentional.  Patient: Cynthia Bennett Type: Established DOB: 09-10-1933 MRN: [id number] PCP: Jerl Mina, MD  Service: Procedure DOS: 05/26/2023 Setting: Ambulatory Location: Ambulatory outpatient facility Delivery: Face-to-face Provider: Oswaldo Done, MD Specialty: Interventional Pain Management Specialty designation: 09 Location: Outpatient facility Ref. Prov.: Jerl Mina, MD       Interventional Therapy   Procedure: Lumbar epidural steroid injection (LESI) (interlaminar)  #9     Laterality: Left   Level:  L4-5 Level.  Imaging: Fluoroscopic guidance         Anesthesia: Local anesthesia (1-2% Lidocaine) Anxiolysis: None                 Sedation: No Sedation                       DOS: 05/26/2023  Performed by: Oswaldo Done, MD  Purpose: Diagnostic/Therapeutic Indications: Lumbar radicular pain of intraspinal etiology of more than 4 weeks that has failed to respond to conservative therapy and is severe enough to impact quality of life or function. 1. Chronic lumbar radicular pain (Left) (L5 Dermatome)   2. Chronic lower extremity pain (2ry area of Pain) (Left)   3. DDD (degenerative disc disease), lumbar   4. Grade 1 spondylolisthesis of L4 over L5   5. Severe (5-6 mm) L4-5 lumbar spinal stenosis and (8 mm) L3-4 spinal stenosis.   6. Weakness of left leg    NAS-11 Pain score:   Pre-procedure: 6 /10   Post-procedure: 0-No pain/10      Position / Prep / Materials:  Position: Prone w/ head of the table raised (slight reverse trendelenburg) to facilitate breathing.  Prep solution: DuraPrep (Iodine Povacrylex [0.7%  available iodine] and Isopropyl Alcohol, 74% w/w) Prep Area: Entire Posterior Lumbar Region from lower scapular tip down to mid buttocks area and from flank to flank. Materials:  Tray: Epidural tray Needle(s):  Type: Epidural needle (Tuohy) Gauge (G):  17 Length: Regular (3.5-in) Qty: 1   H&P (Pre-op Assessment):  Cynthia Bennett is a 89 y.o. (year old), female patient, seen today for interventional treatment. She  has a past surgical history that includes Esophagogastroduodenoscopy (N/A, 04/16/2015); Appendectomy; and Abdominal hysterectomy. Cynthia Bennett has a current medication list which includes the following prescription(s): acetaminophen, vitamin d3, cyanocobalamin, dorzolamide-timolol, doxepin, esomeprazole, magnesium oxide, naloxone, ondansetron, oxycodone, [START ON 06/25/2023] oxycodone, [START ON 07/25/2023] oxycodone, sertraline, trazodone, and zolpidem. Her primarily concern today is the Back Pain (lower)  Initial Vital Signs:  Pulse/HCG Rate: 78  Temp: 98.1 F (36.7 C) Resp: 16 BP: 118/65 SpO2: 98 %  BMI: Estimated body mass index is 26.45 kg/m as calculated from the following:   Height as of this encounter: 5\' 1"  (1.549 m).   Weight as of this encounter: 140 lb (63.5 kg).  Risk Assessment: Allergies: Reviewed. She is allergic to ibuprofen.  Allergy Precautions: None required Coagulopathies: Reviewed. None identified.  Blood-thinner therapy: None at this time Active Infection(s): Reviewed. None identified. Cynthia Bennett is afebrile  Site Confirmation: Cynthia Bennett was asked to confirm the procedure and laterality before marking the site Procedure checklist: Completed Consent: Before the procedure and under the influence of no sedative(s), amnesic(s), or anxiolytics, the patient was informed of the treatment  options, risks and possible complications. To fulfill our ethical and legal obligations, as recommended by the American Medical Association's Code of Ethics, I have informed the patient  of my clinical impression; the nature and purpose of the treatment or procedure; the risks, benefits, and possible complications of the intervention; the alternatives, including doing nothing; the risk(s) and benefit(s) of the alternative treatment(s) or procedure(s); and the risk(s) and benefit(s) of doing nothing. The patient was provided information about the general risks and possible complications associated with the procedure. These may include, but are not limited to: failure to achieve desired goals, infection, bleeding, organ or nerve damage, allergic reactions, paralysis, and death. In addition, the patient was informed of those risks and complications associated to Spine-related procedures, such as failure to decrease pain; infection (i.e.: Meningitis, epidural or intraspinal abscess); bleeding (i.e.: epidural hematoma, subarachnoid hemorrhage, or any other type of intraspinal or peri-dural bleeding); organ or nerve damage (i.e.: Any type of peripheral nerve, nerve root, or spinal cord injury) with subsequent damage to sensory, motor, and/or autonomic systems, resulting in permanent pain, numbness, and/or weakness of one or several areas of the body; allergic reactions; (i.e.: anaphylactic reaction); and/or death. Furthermore, the patient was informed of those risks and complications associated with the medications. These include, but are not limited to: allergic reactions (i.e.: anaphylactic or anaphylactoid reaction(s)); adrenal axis suppression; blood sugar elevation that in diabetics may result in ketoacidosis or comma; water retention that in patients with history of congestive heart failure may result in shortness of breath, pulmonary edema, and decompensation with resultant heart failure; weight gain; swelling or edema; medication-induced neural toxicity; particulate matter embolism and blood vessel occlusion with resultant organ, and/or nervous system infarction; and/or aseptic necrosis of one  or more joints. Finally, the patient was informed that Medicine is not an exact science; therefore, there is also the possibility of unforeseen or unpredictable risks and/or possible complications that may result in a catastrophic outcome. The patient indicated having understood very clearly. We have given the patient no guarantees and we have made no promises. Enough time was given to the patient to ask questions, all of which were answered to the patient's satisfaction. Ms. Eichholz has indicated that she wanted to continue with the procedure. Attestation: I, the ordering provider, attest that I have discussed with the patient the benefits, risks, side-effects, alternatives, likelihood of achieving goals, and potential problems during recovery for the procedure that I have provided informed consent. Date  Time: 05/26/2023 10:12 AM   Pre-Procedure Preparation:  Monitoring: As per clinic protocol. Respiration, ETCO2, SpO2, BP, heart rate and rhythm monitor placed and checked for adequate function Safety Precautions: Patient was assessed for positional comfort and pressure points before starting the procedure. Time-out: I initiated and conducted the "Time-out" before starting the procedure, as per protocol. The patient was asked to participate by confirming the accuracy of the "Time Out" information. Verification of the correct person, site, and procedure were performed and confirmed by me, the nursing staff, and the patient. "Time-out" conducted as per Joint Commission's Universal Protocol (UP.01.01.01). Time: 1109 Start Time: 1109 hrs.  Description/Narrative of Procedure:          Target: Epidural space via interlaminar opening, initially targeting the lower laminar border of the superior vertebral body. Region: Lumbar Approach: Percutaneous paravertebral  Rationale (medical necessity): procedure needed and proper for the diagnosis and/or treatment of the patient's medical symptoms and  needs. Procedural Technique Safety Precautions: Aspiration looking for blood return was conducted prior  to all injections. At no point did we inject any substances, as a needle was being advanced. No attempts were made at seeking any paresthesias. Safe injection practices and needle disposal techniques used. Medications properly checked for expiration dates. SDV (single dose vial) medications used. Description of the Procedure: Protocol guidelines were followed. The procedure needle was introduced through the skin, ipsilateral to the reported pain, and advanced to the target area. Bone was contacted and the needle walked caudad, until the lamina was cleared. The epidural space was identified using "loss-of-resistance technique" with 2-3 ml of PF-NaCl (0.9% NSS), in a 5cc LOR glass syringe.  Vitals:   05/26/23 1011 05/26/23 1106 05/26/23 1111 05/26/23 1118  BP: 118/65 139/68 (!) 141/66 138/65  Pulse: 78 70 81 80  Resp:  16 16 15   Temp: 98.1 F (36.7 C)     TempSrc: Temporal     SpO2: 98% 99% 99% 99%  Weight: 140 lb (63.5 kg)     Height: 5\' 1"  (1.549 m)       Start Time: 1109 hrs. End Time: 1115 hrs.  Imaging Guidance (Spinal):          Type of Imaging Technique: Fluoroscopy Guidance (Spinal) Indication(s): Assistance in needle guidance and placement for procedures requiring needle placement in or near specific anatomical locations not easily accessible without such assistance. Exposure Time: Please see nurses notes. Contrast: Before injecting any contrast, we confirmed that the patient did not have an allergy to iodine, shellfish, or radiological contrast. Once satisfactory needle placement was completed at the desired level, radiological contrast was injected. Contrast injected under live fluoroscopy. No contrast complications. See chart for type and volume of contrast used. Fluoroscopic Guidance: I was personally present during the use of fluoroscopy. "Tunnel Vision Technique" used to  obtain the best possible view of the target area. Parallax error corrected before commencing the procedure. "Direction-depth-direction" technique used to introduce the needle under continuous pulsed fluoroscopy. Once target was reached, antero-posterior, oblique, and lateral fluoroscopic projection used confirm needle placement in all planes. Images permanently stored in EMR. Interpretation: I personally interpreted the imaging intraoperatively. Adequate needle placement confirmed in multiple planes. Appropriate spread of contrast into desired area was observed. No evidence of afferent or efferent intravascular uptake. No intrathecal or subarachnoid spread observed. Permanent images saved into the patient's record.  Antibiotic Prophylaxis:   Anti-infectives (From admission, onward)    None      Indication(s): None identified  Post-operative Assessment:  Post-procedure Vital Signs:  Pulse/HCG Rate: 80  Temp: 98.1 F (36.7 C) Resp: 15 BP: 138/65 SpO2: 99 %  EBL: None  Complications: No immediate post-treatment complications observed by team, or reported by patient.  Note: The patient tolerated the entire procedure well. A repeat set of vitals were taken after the procedure and the patient was kept under observation following institutional policy, for this type of procedure. Post-procedural neurological assessment was performed, showing return to baseline, prior to discharge. The patient was provided with post-procedure discharge instructions, including a section on how to identify potential problems. Should any problems arise concerning this procedure, the patient was given instructions to immediately contact us, at any time, without hesitation. In any case, we plan to contact the patient by telephone for a follow-up status report regarding this interventional procedure.  Comments:  No additional relevant information.  Plan of Care (POC)  Orders:  Orders Placed This Encounter   Procedures   Lumbar Epidural Injection    Scheduling Instructions:  Procedure: Interlaminar LESI L4-5     Laterality: Left     Sedation: Patient's choice     Timeframe: Today    Order Specific Question:   Where will this procedure be performed?    Answer:   [facility]   DG PAIN [facility] C-ARM 1-60 MIN NO REPORT    Intraoperative interpretation by procedural physician at [facility].    Standing Status:   Standing    Number of Occurrences:   1    Order Specific Question:   Reason for exam:    Answer:   Assistance in needle guidance and placement for procedures requiring needle placement in or near specific anatomical locations not easily accessible without such assistance.   Informed Consent Details: Physician/Practitioner Attestation; Transcribe to consent form and obtain patient signature    Note: Always confirm laterality of pain with Ms. Crafton, before procedure. Transcribe to consent form and obtain patient signature.    Order Specific Question:   Physician/Practitioner attestation of informed consent for procedure/surgical case    Answer:   I, the physician/practitioner, attest that I have discussed with the patient the benefits, risks, side effects, alternatives, likelihood of achieving goals and potential problems during recovery for the procedure that I have provided informed consent.    Order Specific Question:   Procedure    Answer:   Lumbar epidural steroid injection under fluoroscopic guidance    Order Specific Question:   Physician/Practitioner performing the procedure    Answer:   Deanndra Kirley A. Laban Emperor, MD    Order Specific Question:   Indication/Reason    Answer:   Low back and/or lower extremity pain secondary to lumbar radiculitis   Provide equipment / supplies at bedside    Procedural tray: Epidural Tray (Disposable  single use) Skin infiltration needle: Regular 1.5-in, 25-G, (x1) Block needle size: Regular standard Catheter: No catheter required     Standing Status:   Standing    Number of Occurrences:   1    Order Specific Question:   Specify    Answer:   Epidural Tray   Chronic Opioid Analgesic:  Oxycodone IR 5 mg, 1 tab PO daily (maximum of 5 mg/day of oxycodone.) MME/day: 7.5 mg/day.   Medications ordered for procedure: Meds ordered this encounter  Medications   iohexol (OMNIPAQUE) 180 MG/ML injection 10 mL    Must be Myelogram-compatible. If not available, you may substitute with a water-soluble, non-ionic, hypoallergenic, myelogram-compatible radiological contrast medium.   lidocaine (XYLOCAINE) 2 % (with pres) injection 400 mg   pentafluoroprop-tetrafluoroeth (GEBAUERS) aerosol   DISCONTD: lactated ringers infusion   DISCONTD: midazolam (VERSED) injection 0.5-2 mg    Make sure Flumazenil is available in the pyxis when using this medication. If oversedation occurs, administer 0.2 mg IV over 15 sec. If after 45 sec no response, administer 0.2 mg again over 1 min; may repeat at 1 min intervals; not to exceed 4 doses (1 mg)   sodium chloride flush (NS) 0.9 % injection 2 mL   ropivacaine (PF) 2 mg/mL (0.2%) (NAROPIN) injection 2 mL   triamcinolone acetonide (KENALOG-40) injection 40 mg   Medications administered: We administered iohexol, lidocaine, pentafluoroprop-tetrafluoroeth, sodium chloride flush, ropivacaine (PF) 2 mg/mL (0.2%), and triamcinolone acetonide.  See the medical record for exact dosing, route, and time of administration.  Follow-up plan:   Return in about 2 weeks (around 06/09/2023) for Proc-day (T,Th), (Face2F), (PPE).       Interventional Therapies  Risk Factors  Complex Considerations:  Advanced  age   Planned  Pending:   Diagnostic/therapeutic left L4-5 and L5-S1 lumbar facet MBB    Under consideration:   Diagnostic/therapeutic left L4-5 and L5-S1 lumbar facet MBB  Possible lumbar facet RFA    Completed:   Diagnostic left L5-S1 pseudoarthrosis inj. #1 (01/09/2022) (10/10 to 0/10)   Therapeutic left L4-5 LESI x8 (06/25/2021) (100/100/80/90)  Therapeutic right L4-5 LESI x1 (07/21/2017) (DNR-F/U)  Palliative right L5-S1 LESI x1 (09/27/2015) (100/100/90)  Therapeutic left L5-S1 LESI x1 (02/05/2016) (100/100/100/100)  Diagnostic right SI joint injection x2 (08/25/2019) (100/100/40/30)  Diagnostic left SI joint injection x2 (01/27/2019) (100/100/100/100)  Diagnostic left glenohumeral joint inj. x1 (04/29/2017) (100/100/98/98)  Left biceps muscle trigger point inj. x1 (07/21/2017) (DNR-F/U)  Right PSIS trigger point inj. x1 (07/27/2018) (100/100/50/>50)  Diagnostic right lumbar facet MBB x3 (01/09/2022) (10/10 to 0/10)  Diagnostic left lumbar facet MBB x4 (01/09/2022) (10/10 to 0/10)  Diagnostic left IA hip inj. x1 (01/27/2019) (100/100/100/100)    Completed by other providers:   None at this time   Therapeutic  Palliative (PRN) options:   None established       Recent Visits Date Type Provider Dept  05/20/23 Office Visit Delano Metz, MD [facility]  04/28/23 Procedure visit Delano Metz, MD [facility]  04/22/23 Office Visit Delano Metz, MD [facility]  Showing recent visits within past 90 days and meeting all other requirements Today's Visits Date Type Provider Dept  05/26/23 Procedure visit Delano Metz, MD [facility]  Showing today's visits and meeting all other requirements Future Appointments Date Type Provider Dept  06/16/23 Appointment Delano Metz, MD [facility]  08/19/23 Appointment Delano Metz, MD [facility]  Showing future appointments within next 90 days and meeting all other requirements  Disposition: Discharge home  Discharge (Date  Time): 05/26/2023; 1119 hrs.   Primary Care Physician: Jerl Mina, MD Location: [facility] Note by: Oswaldo Done, MD (TTS technology used. I apologize for any typographical  errors that were not detected and corrected.) Date: 05/26/2023; Time: 11:24 AM  Disclaimer:  Medicine is not an Visual merchandiser. The only guarantee in medicine is that nothing is guaranteed. It is important to note that the decision to proceed with this intervention was based on the information collected from the patient. The Data and conclusions were drawn from the patient's questionnaire, the interview, and the physical examination. Because the information was provided in large part by the patient, it cannot be guaranteed that it has not been purposely or unconsciously manipulated. Every effort has been made to obtain as much relevant data as possible for this evaluation. It is important to note that the conclusions that lead to this procedure are derived in large part from the available data. Always take into account that the treatment will also be dependent on availability of resources and existing treatment guidelines, considered by other Pain Management Practitioners as being common knowledge and practice, at the time of the intervention. For Medico-Legal purposes, it is also important to point out that variation in procedural techniques and pharmacological choices are the acceptable norm. The indications, contraindications, technique, and results of the above procedure should only be interpreted and judged by a Board-Certified Interventional Pain Specialist with extensive familiarity and expertise in the same exact procedure and technique.

## 2023-05-27 ENCOUNTER — Telehealth: Payer: Self-pay

## 2023-05-27 NOTE — Telephone Encounter (Signed)
Post procedure follow up.  Calls being screened and could not leave message.

## 2023-06-06 IMAGING — CR DG THORACIC SPINE 2V
3 series · 3 of 3 positions shown · non-contrast
Comparison: None.

CLINICAL DATA: Upper back pain

EXAM:
THORACIC SPINE 2 VIEWS

[t-spine ap]
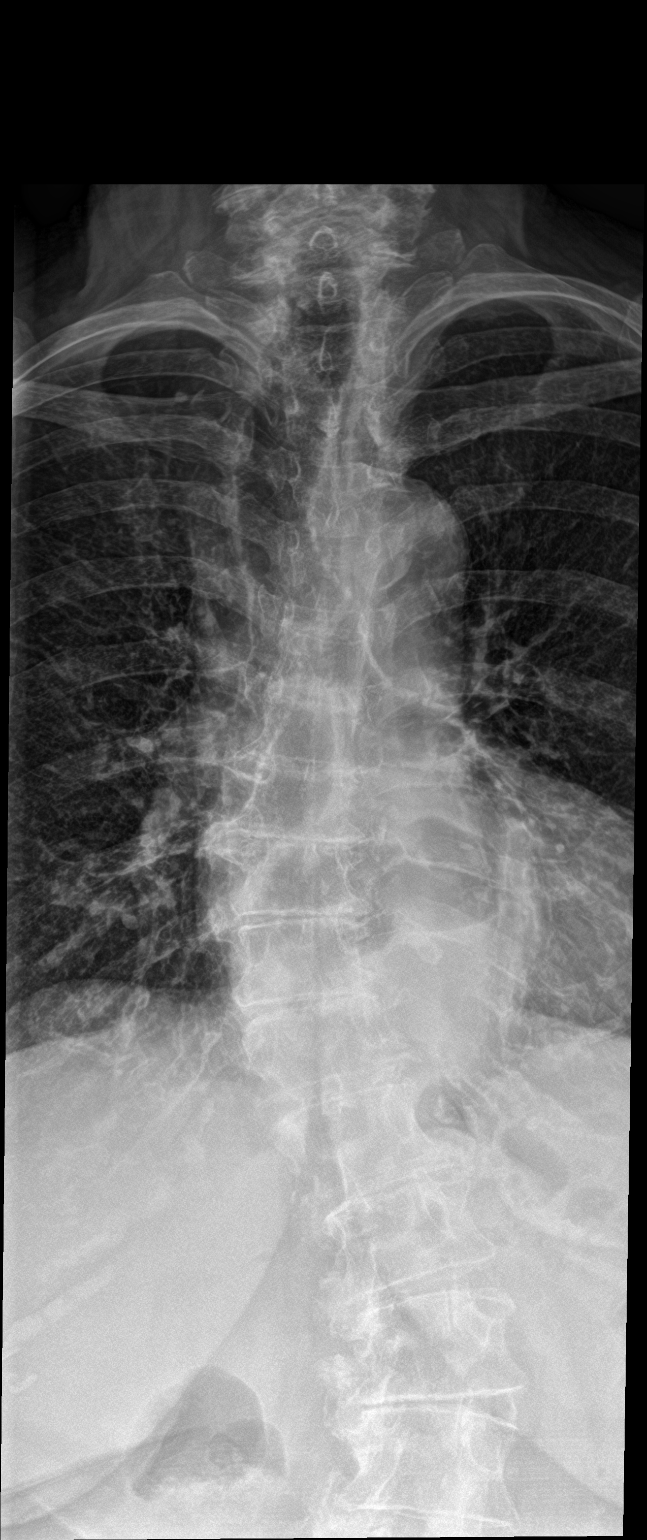

[t-spine lat]
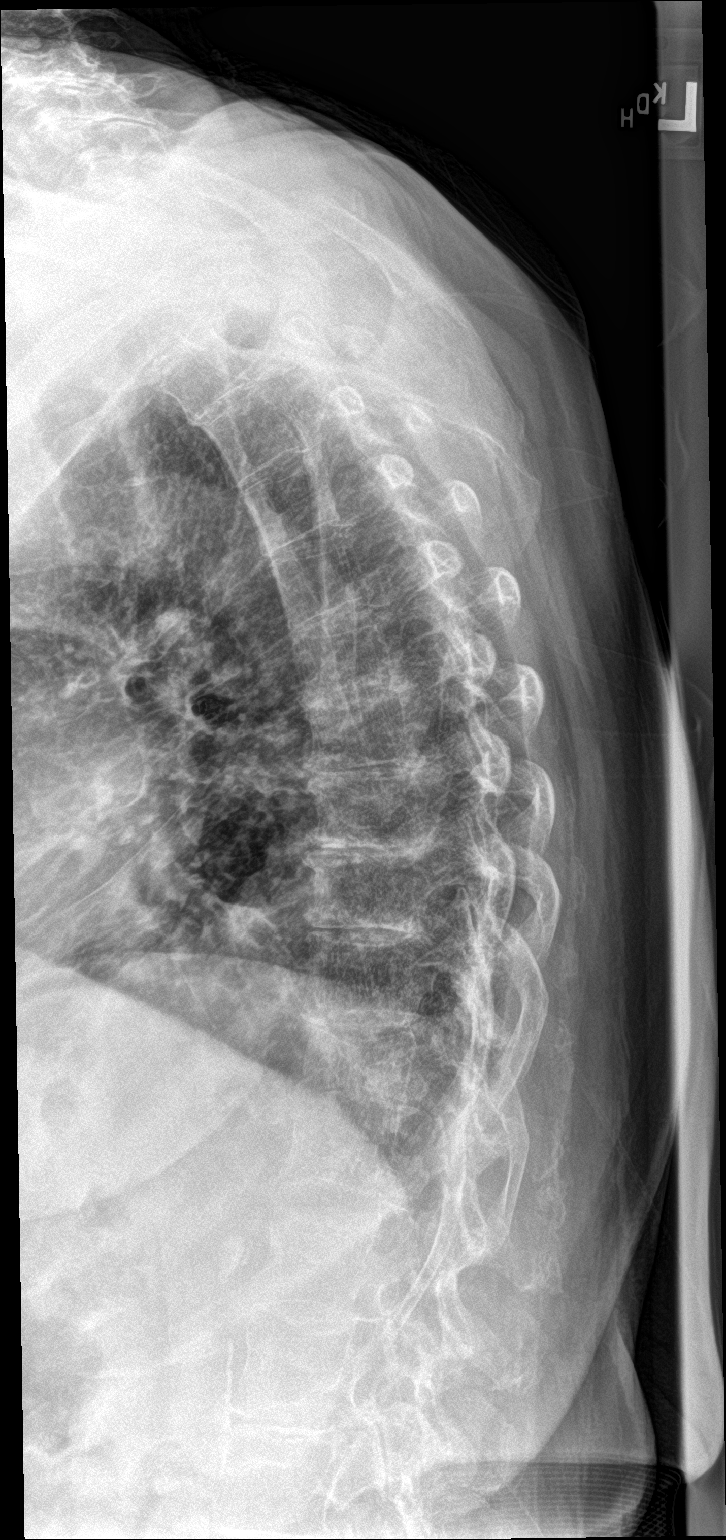

[t-spine swimmers]
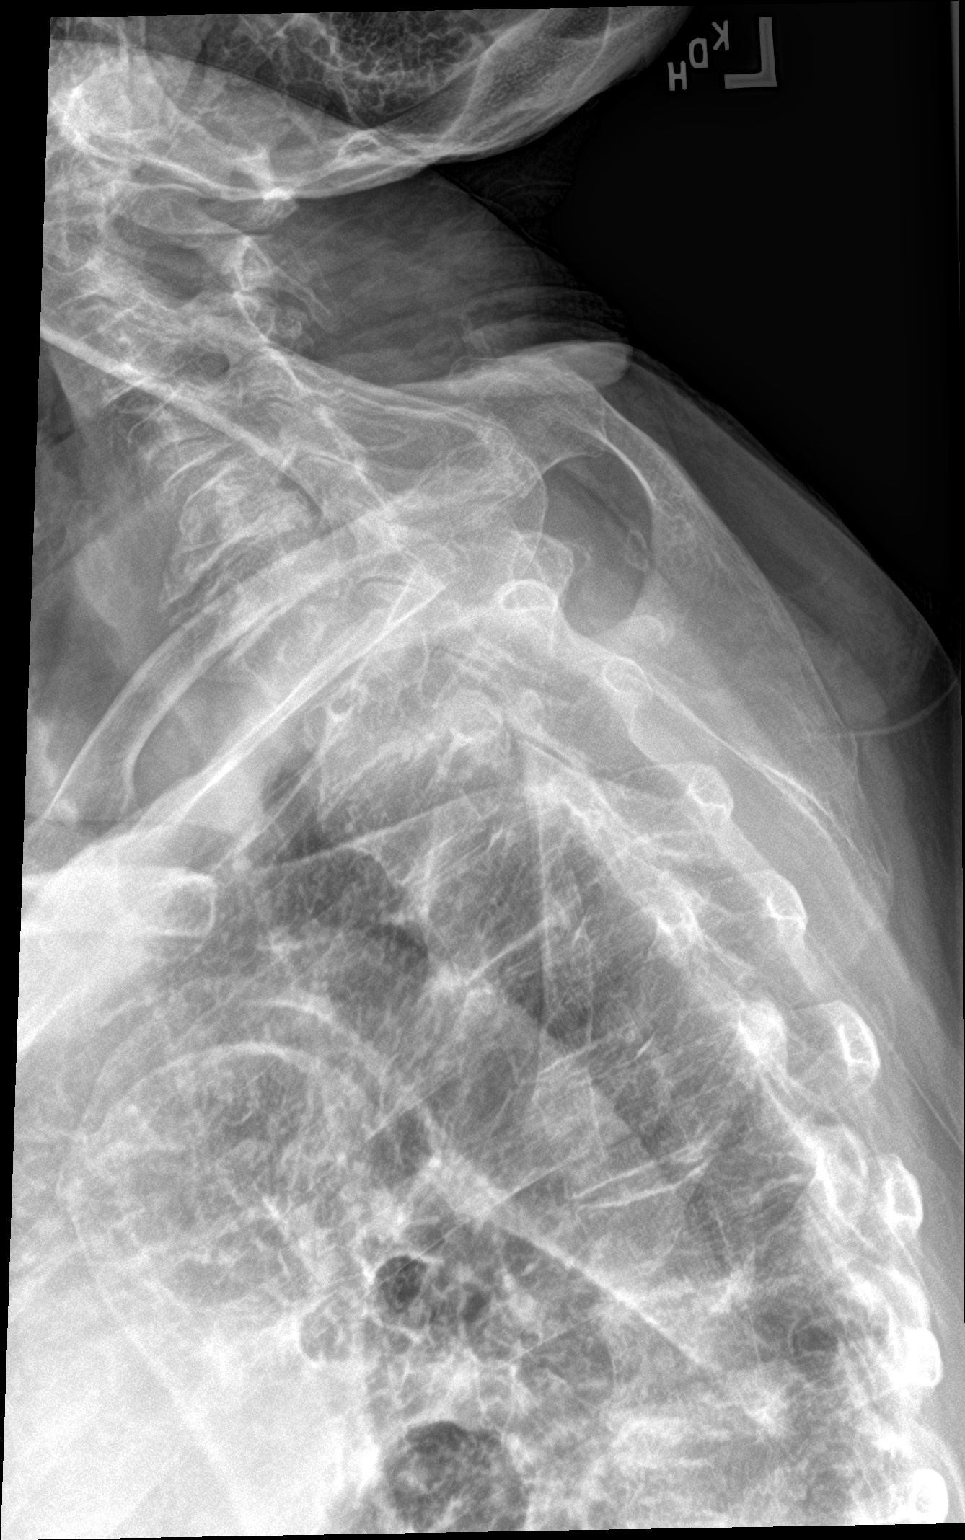

[3 of 3 positions shown; findings below may reference images not displayed]

FINDINGS: There is no evidence of thoracic spine fracture. Thoracolumbar
scoliotic curvature. No static listhesis. Multilevel intervertebral
disc height loss and endplate spurring throughout the thoracic spine
as well as within the visualized lower cervical spine and upper
lumbar spine. No other significant bone abnormalities are
identified. Aortic atherosclerosis. Large hiatal hernia is seen.
IMPRESSION: 1. No acute osseous abnormality of the thoracic spine.
2. Advanced multilevel degenerative changes throughout the thoracic
spine.
3. Thoracolumbar scoliotic curvature.
4. Large hiatal hernia.

## 2023-06-10 ENCOUNTER — Other Ambulatory Visit: Payer: Self-pay | Admitting: *Deleted

## 2023-06-10 ENCOUNTER — Inpatient Hospital Stay: Payer: PPO | Attending: Internal Medicine

## 2023-06-10 ENCOUNTER — Encounter: Payer: Self-pay | Admitting: Internal Medicine

## 2023-06-10 DIAGNOSIS — Z79899 Other long term (current) drug therapy: Secondary | ICD-10-CM | POA: Diagnosis not present

## 2023-06-10 DIAGNOSIS — N1832 Chronic kidney disease, stage 3b: Secondary | ICD-10-CM | POA: Insufficient documentation

## 2023-06-10 DIAGNOSIS — E611 Iron deficiency: Secondary | ICD-10-CM

## 2023-06-10 DIAGNOSIS — D631 Anemia in chronic kidney disease: Secondary | ICD-10-CM | POA: Insufficient documentation

## 2023-06-10 LAB — CBC WITH DIFFERENTIAL (CANCER CENTER ONLY)
Abs Immature Granulocytes: 0.06 10*3/uL (ref 0.00–0.07)
Basophils Absolute: 0.1 10*3/uL (ref 0.0–0.1)
Basophils Relative: 1 %
Eosinophils Absolute: 0.2 10*3/uL (ref 0.0–0.5)
Eosinophils Relative: 2 %
HCT: 38.3 % (ref 36.0–46.0)
Hemoglobin: 11.8 g/dL — ABNORMAL LOW (ref 12.0–15.0)
Immature Granulocytes: 1 %
Lymphocytes Relative: 20 %
Lymphs Abs: 2 10*3/uL (ref 0.7–4.0)
MCH: 25.9 pg — ABNORMAL LOW (ref 26.0–34.0)
MCHC: 30.8 g/dL (ref 30.0–36.0)
MCV: 84.2 fL (ref 80.0–100.0)
Monocytes Absolute: 0.7 10*3/uL (ref 0.1–1.0)
Monocytes Relative: 7 %
Neutro Abs: 7.1 10*3/uL (ref 1.7–7.7)
Neutrophils Relative %: 69 %
Platelet Count: 380 10*3/uL (ref 150–400)
RBC: 4.55 MIL/uL (ref 3.87–5.11)
RDW: 15.2 % (ref 11.5–15.5)
WBC Count: 10.2 10*3/uL (ref 4.0–10.5)
nRBC: 0 % (ref 0.0–0.2)

## 2023-06-10 LAB — FERRITIN: Ferritin: 10 ng/mL — ABNORMAL LOW (ref 11–307)

## 2023-06-10 LAB — BASIC METABOLIC PANEL - CANCER CENTER ONLY
Anion gap: 8 (ref 5–15)
BUN: 16 mg/dL (ref 8–23)
CO2: 22 mmol/L (ref 22–32)
Calcium: 8.8 mg/dL — ABNORMAL LOW (ref 8.9–10.3)
Chloride: 105 mmol/L (ref 98–111)
Creatinine: 0.95 mg/dL (ref 0.44–1.00)
GFR, Estimated: 58 mL/min — ABNORMAL LOW (ref >60)
Glucose, Bld: 109 mg/dL — ABNORMAL HIGH (ref 70–99)
Potassium: 3.8 mmol/L (ref 3.5–5.1)
Sodium: 135 mmol/L (ref 135–145)

## 2023-06-10 LAB — IRON AND TIBC
Iron: 22 ug/dL — ABNORMAL LOW (ref 28–170)
Saturation Ratios: 6 % — ABNORMAL LOW (ref 10.4–31.8)
TIBC: 384 ug/dL (ref 250–450)
UIBC: 362 ug/dL

## 2023-06-10 LAB — SAMPLE TO BLOOD BANK

## 2023-06-12 ENCOUNTER — Encounter: Payer: Self-pay | Admitting: Nurse Practitioner

## 2023-06-12 ENCOUNTER — Inpatient Hospital Stay: Payer: PPO

## 2023-06-12 ENCOUNTER — Inpatient Hospital Stay: Payer: PPO | Attending: Internal Medicine | Admitting: Nurse Practitioner

## 2023-06-12 ENCOUNTER — Other Ambulatory Visit: Payer: Self-pay

## 2023-06-12 VITALS — BP 116/48 | HR 65

## 2023-06-12 VITALS — BP 135/59 | HR 70 | Temp 98.9°F | Resp 18

## 2023-06-12 DIAGNOSIS — G8929 Other chronic pain: Secondary | ICD-10-CM | POA: Diagnosis not present

## 2023-06-12 DIAGNOSIS — Z818 Family history of other mental and behavioral disorders: Secondary | ICD-10-CM | POA: Insufficient documentation

## 2023-06-12 DIAGNOSIS — M797 Fibromyalgia: Secondary | ICD-10-CM | POA: Insufficient documentation

## 2023-06-12 DIAGNOSIS — M2550 Pain in unspecified joint: Secondary | ICD-10-CM | POA: Insufficient documentation

## 2023-06-12 DIAGNOSIS — R5383 Other fatigue: Secondary | ICD-10-CM | POA: Diagnosis not present

## 2023-06-12 DIAGNOSIS — M549 Dorsalgia, unspecified: Secondary | ICD-10-CM | POA: Insufficient documentation

## 2023-06-12 DIAGNOSIS — Z90710 Acquired absence of both cervix and uterus: Secondary | ICD-10-CM | POA: Diagnosis not present

## 2023-06-12 DIAGNOSIS — R634 Abnormal weight loss: Secondary | ICD-10-CM | POA: Diagnosis not present

## 2023-06-12 DIAGNOSIS — D509 Iron deficiency anemia, unspecified: Secondary | ICD-10-CM

## 2023-06-12 DIAGNOSIS — N1831 Chronic kidney disease, stage 3a: Secondary | ICD-10-CM | POA: Insufficient documentation

## 2023-06-12 DIAGNOSIS — K5909 Other constipation: Secondary | ICD-10-CM | POA: Insufficient documentation

## 2023-06-12 DIAGNOSIS — Z886 Allergy status to analgesic agent status: Secondary | ICD-10-CM | POA: Diagnosis not present

## 2023-06-12 DIAGNOSIS — N1832 Chronic kidney disease, stage 3b: Secondary | ICD-10-CM | POA: Diagnosis present

## 2023-06-12 DIAGNOSIS — D631 Anemia in chronic kidney disease: Secondary | ICD-10-CM | POA: Insufficient documentation

## 2023-06-12 DIAGNOSIS — Z8673 Personal history of transient ischemic attack (TIA), and cerebral infarction without residual deficits: Secondary | ICD-10-CM | POA: Insufficient documentation

## 2023-06-12 DIAGNOSIS — Z87891 Personal history of nicotine dependence: Secondary | ICD-10-CM | POA: Insufficient documentation

## 2023-06-12 DIAGNOSIS — Z79899 Other long term (current) drug therapy: Secondary | ICD-10-CM | POA: Insufficient documentation

## 2023-06-12 DIAGNOSIS — Z825 Family history of asthma and other chronic lower respiratory diseases: Secondary | ICD-10-CM | POA: Diagnosis not present

## 2023-06-12 DIAGNOSIS — Z9049 Acquired absence of other specified parts of digestive tract: Secondary | ICD-10-CM | POA: Diagnosis not present

## 2023-06-12 DIAGNOSIS — E611 Iron deficiency: Secondary | ICD-10-CM

## 2023-06-12 MED ORDER — SODIUM CHLORIDE 0.9 % IV SOLN
Freq: Once | INTRAVENOUS | Status: AC
  Filled 2023-06-12: qty 250

## 2023-06-12 MED ORDER — SODIUM CHLORIDE 0.9 % IV SOLN
200.0000 mg | Freq: Once | INTRAVENOUS | Status: AC
  Administered 2023-06-12: 200 mg via INTRAVENOUS
  Filled 2023-06-12: qty 200

## 2023-06-12 NOTE — Progress Notes (Signed)
[facility] Center CONSULT NOTE  Patient Care Team: Jerl Mina, MD as PCP - General (Family Medicine) Earna Coder, MD as Consulting Physician (Hematology and Oncology)  CHIEF COMPLAINTS/PURPOSE OF CONSULTATION: Anemia   HEMATOLOGY HISTORY  # CHRONIC NORMOCYTIC ANEMIA [since 2019-PCP- Hb 8-9; Iron sat- 5%;NO Ferritin; GFR- 43] EGD/colonoscopy-?2013-2104- pt declines procedures; Intolerance to p.o. iron. IV Venofer.   #CKD stage III/chronic pain back-pain clinic narcotics.  HISTORY OF PRESENTING ILLNESS: In a wheelchair.  Accompanied by family.   Cynthia Bennett 89 y.o.  female history of anemia thought to be secondary to CKD vs other etiologies who returns to clinic for follow up.   She has chronic back pain and underwent lumbar steroid injection with Dr Laban Emperor. She last received IV iron May 2024. She feels increasingly tired. Continues to deny black or bloody stools. No hematuria or vaginal bleeding. Feels fatigued. No chest pain or shortness of breath. Would like to focus on palliative measures for he rsymptoms as opposed to invasive workup.  Review of Systems  Constitutional:  Positive for malaise/fatigue. Negative for chills, diaphoresis and fever.  HENT:  Negative for nosebleeds and sore throat.   Eyes:  Negative for double vision.  Respiratory:  Negative for cough, hemoptysis, sputum production, shortness of breath and wheezing.   Cardiovascular:  Negative for chest pain, palpitations, orthopnea and leg swelling.  Gastrointestinal:  Negative for abdominal pain, blood in stool, constipation, diarrhea, heartburn, melena, nausea and vomiting.  Genitourinary:  Negative for dysuria, frequency and urgency.  Musculoskeletal:  Positive for back pain and joint pain.  Skin: Negative.  Negative for itching and rash.  Neurological:  Negative for dizziness, tingling, focal weakness, weakness and headaches.  Endo/Heme/Allergies:  Does not bruise/bleed easily.   Psychiatric/Behavioral:  Negative for depression. The patient is not nervous/anxious and does not have insomnia.    MEDICAL HISTORY:  Past Medical History:  Diagnosis Date   Dizzy spells    Fibromyalgia    Glaucoma    Hiatal hernia    Osteoarthritis    Osteoarthritis of spine with radiculopathy, lumbar region 08/14/2015   Peptic ulcer disease    Reflux    Spinal stenosis    Stroke (HCC)    TIA (transient ischemic attack)    SURGICAL HISTORY: Past Surgical History:  Procedure Laterality Date   ABDOMINAL HYSTERECTOMY     APPENDECTOMY     ESOPHAGOGASTRODUODENOSCOPY N/A 04/16/2015   Procedure: ESOPHAGOGASTRODUODENOSCOPY (EGD);  Surgeon: Wallace Cullens, MD;  Location: [facility] ENDOSCOPY;  Service: Gastroenterology;  Laterality: N/A;   SOCIAL HISTORY: Social History   Socioeconomic History   Marital status: Widowed    Spouse name: Not on file   Number of children: Not on file   Years of education: Not on file   Highest education level: Not on file  Occupational History   Not on file  Tobacco Use   Smoking status: Former   Smokeless tobacco: Never  Vaping Use   Vaping status: Never Used  Substance and Sexual Activity   Alcohol use: No    Alcohol/week: 0.0 standard drinks of alcohol   Drug use: No   Sexual activity: Not on file  Other Topics Concern   Not on file  Social History Narrative   Pt lives in [place]; with son. Quit smoking in 1957. No alcohol. Designed clothes/ alterations.    Social Determinants of Health   Financial Resource Strain: Patient Declined (03/30/2023)   Received from [facility], [facility]  Overall Financial Resource Strain (CARDIA)    Difficulty of Paying Living Expenses: Patient declined  Food Insecurity: Patient Declined (03/30/2023)   Received from [facility], [facility]   Hunger Vital Sign    Worried About Running Out of Food in the Last Year: Patient declined    Ran Out  of Food in the Last Year: Patient declined  Transportation Needs: Patient Declined (03/30/2023)   Received from [facility], [facility]   PRAPARE - Transportation    In the past 12 months, has lack of transportation kept you from medical appointments or from getting medications?: Patient declined    Lack of Transportation (Non-Medical): Patient declined  Physical Activity: Not on file  Stress: Not on file  Social Connections: Not on file  Intimate Partner Violence: Not on file   FAMILY HISTORY: Family History  Problem Relation Age of Onset   Mental illness Mother    COPD Father    ALLERGIES:  is allergic to ibuprofen.  MEDICATIONS:  Current Outpatient Medications  Medication Sig Dispense Refill   acetaminophen (TYLENOL) 325 MG tablet Take 650 mg by mouth every 6 (six) hours as needed.     Cholecalciferol (VITAMIN D3) 20 MCG (800 UNIT) TABS Take by mouth 2 (two) times daily.     cyanocobalamin (VITAMIN B12) 1000 MCG/ML injection Inject 1,000 mcg into the muscle every 30 (thirty) days.     dorzolamide-timolol (COSOPT) 22.3-6.8 MG/ML ophthalmic solution Place 1 drop into both eyes in the morning and at bedtime.     doxepin (SINEQUAN) 100 MG capsule Take 100 mg by mouth at bedtime.     esomeprazole (NEXIUM) 40 MG capsule Take 40 mg by mouth 2 (two) times daily.     magnesium oxide (MAG-OX) 400 MG tablet Take by mouth.     naloxone (NARCAN) nasal spray 4 mg/0.1 mL Place 1 spray into the nose as needed for up to 365 doses (for opioid-induced respiratory depresssion). In case of emergency (overdose), spray once into each nostril. If no response within 3 minutes, repeat application and call 911. 1 each 0   ondansetron (ZOFRAN) 8 MG tablet TAKE 1 TABLET BY MOUTH EVERY 8 HOURS AS NEEDED FOR NAUSEA AND VOMITING 45 tablet 1   oxyCODONE (OXY IR/ROXICODONE) 5 MG immediate release tablet Take 1 tablet (5 mg total) by mouth daily as needed for severe pain. Must last  30 days 30 tablet 0   [START ON 06/25/2023] oxyCODONE (OXY IR/ROXICODONE) 5 MG immediate release tablet Take 1 tablet (5 mg total) by mouth daily as needed for severe pain. Must last 30 days 30 tablet 0   [START ON 07/25/2023] oxyCODONE (OXY IR/ROXICODONE) 5 MG immediate release tablet Take 1 tablet (5 mg total) by mouth daily as needed for severe pain. Must last 30 days 30 tablet 0   sertraline (ZOLOFT) 100 MG tablet Take 100 mg by mouth daily.     traZODone (DESYREL) 50 MG tablet Take 50 mg by mouth at bedtime as needed.     zolpidem (AMBIEN) 10 MG tablet TAKE 1/2 TO 1 TABLET BY MOUTH EVERY NIGHT AT BEDTIME     No current facility-administered medications for this visit.   PHYSICAL EXAMINATION: Vitals:   06/12/23 1500  BP: (!) 135/59  Pulse: 70  Resp: 18  Temp: 98.9 F (37.2 C)  SpO2: 100%   There were no vitals filed for this visit.  Physical Exam Vitals reviewed.  Constitutional:  Appearance: She is not ill-appearing.     Comments: Accompanied by daughter. In wheelchair.   HENT:     Mouth/Throat:     Pharynx: No oropharyngeal exudate.  Cardiovascular:     Rate and Rhythm: Normal rate and regular rhythm.  Pulmonary:     Effort: Pulmonary effort is normal. No respiratory distress.     Breath sounds: No wheezing.  Abdominal:     General: There is no distension.     Palpations: Abdomen is soft.     Tenderness: There is no abdominal tenderness. There is no guarding.  Musculoskeletal:        General: No tenderness.  Skin:    General: Skin is warm.     Coloration: Skin is pale.  Neurological:     Mental Status: She is alert and oriented to person, place, and time.  Psychiatric:        Mood and Affect: Mood and affect normal.        Behavior: Behavior normal.     LABORATORY DATA:  I have reviewed the data as listed Lab Results  Component Value Date   WBC 10.2 06/10/2023   HGB 11.8 (L) 06/10/2023   HCT 38.3 06/10/2023   MCV 84.2 06/10/2023   PLT 380 06/10/2023    Recent Labs    07/31/22 1421 11/07/22 1249 03/13/23 1245 06/10/23 1520  NA 136 136 137 135  K 3.7 4.2 4.1 3.8  CL 108 104 106 105  CO2 24 27 23 22   GLUCOSE 85 79 125* 109*  BUN 15 15 14 16   CREATININE 1.00 0.97 0.97 0.95  CALCIUM 8.6* 8.9 9.0 8.8*  GFRNONAA 55* 57* 57* 58*  PROT 6.7  --   --   --   ALBUMIN 3.6  --   --   --   AST 17  --   --   --   ALT 10  --   --   --   ALKPHOS 54  --   --   --   BILITOT 0.3  --   --   --    Iron/TIBC/Ferritin/ %Sat    Component Value Date/Time   IRON 22 (L) 06/10/2023 1520   TIBC 384 06/10/2023 1520   FERRITIN 10 (L) 06/10/2023 1520   IRONPCTSAT 6 (L) 06/10/2023 1520     Assessment & Plan:  # Anemia - thought to be secondary to GI/iron deficiency & CKD stage III. Hmg 8-9 (MCV 77). S/p IV iron infusion/Venofer. Tolerated well. Hemoglobin 11.8. Ferritin 10, Iron sat 6%. Proceed with venofer today.    # Etiology: Unclear etiology.  GI versus CKD.  colo > 10 years ago. Continues to decline GI workup or invasive procedures. Declines imaging. Understands venofer is given for palliative intent.      # CKD stage IIIa- GFR 57. Today, 58. Stable. No role for EPO currently.     # Constipation: Chronic. Unchanged. Question if due to narcotics-defer to PCP/pain clinic- stable   # Chronic back pain- s/p epidural with Dr. Laban Emperor. Stable   # weight loss- s/p dental extraction. S/p referral to nutrition- improved.    # DISPOSITION:  Venofer weekly x 5 treatments. She'll receive first today then 4 additional infusions.  4 mo- lab (cbc, bmp, ferritin, iron studies) Few days to week later see Dr. Donneta Romberg or myself, +/- venofer- la  No problem-specific Assessment & Plan notes found for this encounter.  Thank you for allowing me to participate in the care of  this very pleasant patient.   All questions were answered. The patient knows to call the clinic with any problems, questions or concerns.    Alinda Dooms, NP 06/12/2023

## 2023-06-15 NOTE — Progress Notes (Signed)
Patient: Cynthia Bennett  Service Category: E/M  Provider: Oswaldo Done, MD  DOB: 04-28-1933  DOS: 06/16/2023  Location: Office  MRN: [id number]  Setting: Ambulatory outpatient  Referring Provider: Jerl Mina, MD  Type: Established Patient  Specialty: Interventional Pain Management  PCP: Jerl Mina, MD  Location: Remote location  Delivery: TeleHealth     Virtual Encounter - Pain Management PROVIDER NOTE: Information contained herein reflects review and annotations entered in association with encounter. Interpretation of such information and data should be left to medically-trained personnel. Information provided to patient can be located elsewhere in the medical record under "Patient Instructions". Document created using STT-dictation technology, any transcriptional errors that may result from process are unintentional.    Contact & Pharmacy Preferred: [phone] Home: [phone] (home) Mobile: [phone] (mobile) E-mail: [name]  [facility] [place] - [address] [place] [address] Phone: [phone] Fax: [phone]   Pre-screening  Cynthia Bennett offered "in-person" vs "virtual" encounter. She indicated preferring virtual for this encounter.   Reason COVID-19*  Social distancing based on CDC and AMA recommendations.   I contacted Cynthia Bennett on 06/16/2023 via telephone.      I clearly identified myself as Oswaldo Done, MD. I verified that I was speaking with the correct person using two identifiers (Name: Cynthia Bennett, and date of birth: [birth year]).  Consent I sought verbal advanced consent from Cynthia Bennett for virtual visit interactions. I informed Cynthia Bennett of possible security and privacy concerns, risks, and limitations associated with providing "not-in-person" medical evaluation and management services. I also informed Cynthia Bennett of the availability of "in-person" appointments. Finally, I informed her that there would be a  charge for the virtual visit and that she could be  personally, fully or partially, financially responsible for it. Cynthia Bennett expressed understanding and agreed to proceed.   Historic Elements   Cynthia Bennett is a 89 y.o. year old, female patient evaluated today after our last contact on 05/26/2023. Cynthia Bennett  has a past medical history of Dizzy spells, Fibromyalgia, Glaucoma, Hiatal hernia, Osteoarthritis, Osteoarthritis of spine with radiculopathy, lumbar region (08/14/2015), Peptic ulcer disease, Reflux, Spinal stenosis, Stroke (HCC), and TIA (transient ischemic attack). She also  has a past surgical history that includes Esophagogastroduodenoscopy (N/A, 04/16/2015); Appendectomy; and Abdominal hysterectomy. Cynthia Bennett has a current medication list which includes the following prescription(s): acetaminophen, vitamin d3, cyanocobalamin, dorzolamide-timolol, doxepin, esomeprazole, magnesium oxide, naloxone, ondansetron, oxycodone, [START ON 06/25/2023] oxycodone, [START ON 07/25/2023] oxycodone, sertraline, and zolpidem. She  reports that she has quit smoking. She has never used smokeless tobacco. She reports that she does not drink alcohol and does not use drugs. Cynthia Bennett is allergic to ibuprofen.  BMI: Estimated body mass index is 26.45 kg/m as calculated from the following:   Height as of 05/26/23: 5\' 1"  (1.549 m).   Weight as of 05/26/23: 140 lb (63.5 kg). Last encounter: 05/20/2023. Last procedure: 05/26/2023.  HPI  Today, she is being contacted for a post-procedure assessment.  The patient was not able to make it to her face-to-face appointment secondary to the fact that she has not been feeling well and they started her on some iron infusions.  She refers that this is not the first time that she has had that cancer treatment and currently as I speak to the patient she is in the cancer center receiving her infusion.  With regards to her epidural steroid injection she refers that  the pain and numbness that she  had in the left leg is almost completely gone.  She refers having just a little bit around her left calf area, otherwise the pain is completely gone and most of the numbness has also gone away.  We will see her again on a PRN basis.  Post-procedure evaluation   Procedure: Lumbar epidural steroid injection (LESI) (interlaminar)  #9     Laterality: Left   Level:  L4-5 Level.  Imaging: Fluoroscopic guidance         Anesthesia: Local anesthesia (1-2% Lidocaine) Anxiolysis: None                 Sedation: No Sedation                       DOS: 05/26/2023  Performed by: Oswaldo Done, MD  Purpose: Diagnostic/Therapeutic Indications: Lumbar radicular pain of intraspinal etiology of more than 4 weeks that has failed to respond to conservative therapy and is severe enough to impact quality of life or function. 1. Chronic lumbar radicular pain (Left) (L5 Dermatome)   2. Chronic lower extremity pain (2ry area of Pain) (Left)   3. DDD (degenerative disc disease), lumbar   4. Grade 1 spondylolisthesis of L4 over L5   5. Severe (5-6 mm) L4-5 lumbar spinal stenosis and (8 mm) L3-4 spinal stenosis.   6. Weakness of left leg    NAS-11 Pain score:   Pre-procedure: 6 /10   Post-procedure: 0-No pain/10      Effectiveness:  Initial hour after procedure: 100 %. Subsequent 4-6 hours post-procedure: 100 %. Analgesia past initial 6 hours: 80 % (ongoing). Ongoing improvement:  Analgesic: The patient indicates having attained a 100% relief of the pain for the duration of the local anesthetic followed by an ongoing 80% improvement. Function: Cynthia Bennett reports improvement in function ROM: Cynthia Bennett reports improvement in ROM  Pharmacotherapy Assessment   Opioid Analgesic: Oxycodone IR 5 mg, 1 tab PO daily (maximum of 5 mg/day of oxycodone.) MME/day: 7.5 mg/day.   Monitoring: [place] PMP: PDMP reviewed during this encounter.       Pharmacotherapy: No side-effects or adverse reactions  reported. Compliance: No problems identified. Effectiveness: Clinically acceptable. Plan: Refer to "POC". UDS:  Summary  Date Value Ref Range Status  04/22/2023 Note  Final    Comment:    ==================================================================== Tox[employer] Select 13 (MW) ==================================================================== Specimen Alert Not Detected result may be consistent with the time of last use noted for this medication. AS NEEDED (Oxycodone) ==================================================================== Test                             Result       Flag       Units  Drug Absent but Declared for Prescription Verification   Oxycodone                      Not Detected UNEXPECTED ng/mg creat ==================================================================== Test                      Result    Flag   Units      Ref Range   Creatinine              65               mg/dL      >=16 ==================================================================== Declared Medications:  The flagging  and interpretation on this report are based on the  following declared medications.  Unexpected results may arise from  inaccuracies in the declared medications.   **Note: The testing scope of this panel includes these medications:   Oxycodone   **Note: The testing scope of this panel does not include the  following reported medications:   Acetaminophen (Tylenol)  Doxepin (Sinequan)  Esomeprazole (Nexium)  Eye Drops  Magnesium (Mag-Ox)  Naloxone (Narcan)  Ondansetron (Zofran)  Sertraline (Zoloft)  Trazodone (Desyrel)  Vitamin D3  Zolpidem (Ambien) ==================================================================== For clinical consultation, please call [phone]. ====================================================================    No results found for: "CBDTHCR", "D8THCCBX", "D9THCCBX"   Laboratory Chemistry Profile   Renal Lab Results   Component Value Date   BUN 16 06/10/2023   CREATININE 0.95 06/10/2023   BCR 11 (L) 01/10/2019   GFRAA 46 (L) 01/10/2019   GFRNONAA 58 (L) 06/10/2023    Hepatic Lab Results  Component Value Date   AST 17 07/31/2022   ALT 10 07/31/2022   ALBUMIN 3.6 07/31/2022   ALKPHOS 54 07/31/2022   LIPASE 139 04/27/2014    Electrolytes Lab Results  Component Value Date   NA 135 06/10/2023   K 3.8 06/10/2023   CL 105 06/10/2023   CALCIUM 8.8 (L) 06/10/2023   MG 2.3 01/10/2019    Bone Lab Results  Component Value Date   25OHVITD1 26 (L) 01/10/2019   25OHVITD2 1.6 01/10/2019   25OHVITD3 24 01/10/2019    Inflammation (CRP: Acute Phase) (ESR: Chronic Phase) Lab Results  Component Value Date   CRP 0.9 08/24/2020   ESRSEDRATE 51 (H) 01/10/2019         Note: Above Lab results reviewed.  Imaging  DG PAIN CLINIC C-ARM 1-60 MIN NO REPORT Fluoro was used, but no Radiologist interpretation will be provided.  Please refer to "NOTES" tab for provider progress note.  Assessment  The primary encounter diagnosis was Chronic lower extremity pain (2ry area of Pain) (Left). Diagnoses of Chronic lumbar radicular pain (Left) (L5 Dermatome), Weakness of left leg, and Postop check were also pertinent to this visit.  Plan of Care  Problem-specific:  No problem-specific Assessment & Plan notes found for this encounter.  Cynthia Bennett has a current medication list which includes the following long-term medication(s): doxepin, oxycodone, [START ON 06/25/2023] oxycodone, [START ON 07/25/2023] oxycodone, and zolpidem.  Pharmacotherapy (Medications Ordered): No orders of the defined types were placed in this encounter.  Orders:  Orders Placed This Encounter  Procedures   Nursing Instructions:    Please complete this patient's postprocedure evaluation.    Scheduling Instructions:     Please complete this patient's postprocedure evaluation.   Follow-up plan:   No follow-ups on file.       Interventional Therapies  Risk Factors  Complex Considerations:  Advanced age   Planned  Pending:   Diagnostic/therapeutic left L4-5 and L5-S1 lumbar facet MBB    Under consideration:   Diagnostic/therapeutic left L4-5 and L5-S1 lumbar facet MBB  Possible lumbar facet RFA    Completed:   Diagnostic left L5-S1 pseudoarthrosis inj. #1 (01/09/2022) (10/10 to 0/10)  Therapeutic left L4-5 LESI x8 (06/25/2021) (100/100/80/90)  Therapeutic right L4-5 LESI x1 (07/21/2017) (DNR-F/U)  Palliative right L5-S1 LESI x1 (09/27/2015) (100/100/90)  Therapeutic left L5-S1 LESI x1 (02/05/2016) (100/100/100/100)  Diagnostic right SI joint injection x2 (08/25/2019) (100/100/40/30)  Diagnostic left SI joint injection x2 (01/27/2019) (100/100/100/100)  Diagnostic left glenohumeral joint inj. x1 (04/29/2017) (100/100/98/98)  Left biceps muscle trigger point  inj. x1 (07/21/2017) (DNR-F/U)  Right PSIS trigger point inj. x1 (07/27/2018) (100/100/50/>50)  Diagnostic right lumbar facet MBB x3 (01/09/2022) (10/10 to 0/10)  Diagnostic left lumbar facet MBB x4 (01/09/2022) (10/10 to 0/10)  Diagnostic left IA hip inj. x1 (01/27/2019) (100/100/100/100)    Completed by other providers:   None at this time   Therapeutic  Palliative (PRN) options:   None established      Recent Visits Date Type Provider Dept  05/26/23 Procedure visit Delano Metz, MD [facility]  05/20/23 Office Visit Delano Metz, MD [facility]  04/28/23 Procedure visit Delano Metz, MD [facility]  04/22/23 Office Visit Delano Metz, MD [facility]  Showing recent visits within past 90 days and meeting all other requirements Today's Visits Date Type Provider Dept  06/16/23 Office Visit Delano Metz, MD [facility]  Showing today's visits and meeting all other requirements Future Appointments Date Type Provider Dept  08/19/23 Appointment Delano Metz, MD  [facility]  Showing future appointments within next 90 days and meeting all other requirements  I discussed the assessment and treatment plan with the patient. The patient was provided an opportunity to ask questions and all were answered. The patient agreed with the plan and demonstrated an understanding of the instructions.  Patient advised to call back or seek an in-person evaluation if the symptoms or condition worsens.  Duration of encounter: 12 minutes.  Note by: Oswaldo Done, MD Date: 06/16/2023; Time: 12:30 PM

## 2023-06-16 ENCOUNTER — Ambulatory Visit: Payer: PPO | Attending: Pain Medicine | Admitting: Pain Medicine

## 2023-06-16 DIAGNOSIS — R29898 Other symptoms and signs involving the musculoskeletal system: Secondary | ICD-10-CM

## 2023-06-16 DIAGNOSIS — G8929 Other chronic pain: Secondary | ICD-10-CM | POA: Diagnosis not present

## 2023-06-16 DIAGNOSIS — M5416 Radiculopathy, lumbar region: Secondary | ICD-10-CM

## 2023-06-16 DIAGNOSIS — M79605 Pain in left leg: Secondary | ICD-10-CM

## 2023-06-16 DIAGNOSIS — Z09 Encounter for follow-up examination after completed treatment for conditions other than malignant neoplasm: Secondary | ICD-10-CM

## 2023-06-18 ENCOUNTER — Inpatient Hospital Stay: Payer: PPO

## 2023-06-18 VITALS — BP 131/59 | HR 86 | Temp 97.1°F | Resp 18

## 2023-06-18 DIAGNOSIS — N1831 Chronic kidney disease, stage 3a: Secondary | ICD-10-CM | POA: Diagnosis not present

## 2023-06-18 DIAGNOSIS — E611 Iron deficiency: Secondary | ICD-10-CM

## 2023-06-18 MED ORDER — SODIUM CHLORIDE 0.9 % IV SOLN
200.0000 mg | Freq: Once | INTRAVENOUS | Status: AC
  Administered 2023-06-18: 200 mg via INTRAVENOUS
  Filled 2023-06-18: qty 200

## 2023-06-18 MED ORDER — SODIUM CHLORIDE 0.9 % IV SOLN
Freq: Once | INTRAVENOUS | Status: AC
  Filled 2023-06-18: qty 250

## 2023-06-24 MED FILL — Iron Sucrose Inj 20 MG/ML (Fe Equiv): INTRAVENOUS | Qty: 10 | Status: AC

## 2023-06-25 ENCOUNTER — Inpatient Hospital Stay: Payer: PPO

## 2023-06-25 VITALS — BP 135/68 | HR 73 | Temp 98.3°F | Resp 19

## 2023-06-25 DIAGNOSIS — N1831 Chronic kidney disease, stage 3a: Secondary | ICD-10-CM | POA: Diagnosis not present

## 2023-06-25 DIAGNOSIS — E611 Iron deficiency: Secondary | ICD-10-CM

## 2023-06-25 MED ORDER — SODIUM CHLORIDE 0.9 % IV SOLN
200.0000 mg | Freq: Once | INTRAVENOUS | Status: AC
  Administered 2023-06-25: 200 mg via INTRAVENOUS
  Filled 2023-06-25: qty 200

## 2023-06-25 MED ORDER — SODIUM CHLORIDE 0.9 % IV SOLN
Freq: Once | INTRAVENOUS | Status: AC
  Filled 2023-06-25: qty 250

## 2023-07-02 ENCOUNTER — Ambulatory Visit: Payer: PPO

## 2023-07-02 ENCOUNTER — Inpatient Hospital Stay: Payer: PPO

## 2023-07-02 VITALS — BP 113/69 | HR 60 | Temp 97.3°F | Resp 18

## 2023-07-02 DIAGNOSIS — E611 Iron deficiency: Secondary | ICD-10-CM

## 2023-07-02 DIAGNOSIS — N1831 Chronic kidney disease, stage 3a: Secondary | ICD-10-CM | POA: Diagnosis not present

## 2023-07-02 MED ORDER — SODIUM CHLORIDE 0.9 % IV SOLN
Freq: Once | INTRAVENOUS | Status: AC
  Filled 2023-07-02: qty 250

## 2023-07-02 MED ORDER — SODIUM CHLORIDE 0.9 % IV SOLN
200.0000 mg | Freq: Once | INTRAVENOUS | Status: AC
  Administered 2023-07-02: 200 mg via INTRAVENOUS
  Filled 2023-07-02: qty 200

## 2023-07-02 NOTE — Patient Instructions (Signed)
[facility]  Discharge Instructions: Thank you for choosing [facility] to provide your oncology and hematology care.  If you have a lab appointment with the Cancer Center, please go directly to the Cancer Center and check in at the registration area.  Wear comfortable clothing and clothing appropriate for easy access to any Portacath or PICC line.   We strive to give you quality time with your provider. You may need to reschedule your appointment if you arrive late (15 or more minutes).  Arriving late affects you and other patients whose appointments are after yours.  Also, if you miss three or more appointments without notifying the office, you may be dismissed from the clinic at the provider's discretion.      For prescription refill requests, have your pharmacy contact our office and allow 72 hours for refills to be completed.    Today you received the following chemotherapy and/or immunotherapy agents Venofer.      To help prevent nausea and vomiting after your treatment, we encourage you to take your nausea medication as directed.  BELOW ARE SYMPTOMS THAT SHOULD BE REPORTED IMMEDIATELY: *FEVER GREATER THAN 100.4 F (38 C) OR HIGHER *CHILLS OR SWEATING *NAUSEA AND VOMITING THAT IS NOT CONTROLLED WITH YOUR NAUSEA MEDICATION *UNUSUAL SHORTNESS OF BREATH *UNUSUAL BRUISING OR BLEEDING *URINARY PROBLEMS (pain or burning when urinating, or frequent urination) *BOWEL PROBLEMS (unusual diarrhea, constipation, pain near the anus) TENDERNESS IN MOUTH AND THROAT WITH OR WITHOUT PRESENCE OF ULCERS (sore throat, sores in mouth, or a toothache) UNUSUAL RASH, SWELLING OR PAIN  UNUSUAL VAGINAL DISCHARGE OR ITCHING   Items with * indicate a potential emergency and should be followed up as soon as possible or go to the Emergency Department if any problems should occur.  Please show the CHEMOTHERAPY ALERT CARD or IMMUNOTHERAPY ALERT CARD at check-in to  the Emergency Department and triage nurse.  Should you have questions after your visit or need to cancel or reschedule your appointment, please contact [facility]  [phone] and follow the prompts.  Office hours are 8:00 a.m. to 4:30 p.m. Monday - Friday. Please note that voicemails left after 4:00 p.m. may not be returned until the following business day.  We are closed weekends and major holidays. You have access to a nurse at all times for urgent questions. Please call the main number to the clinic [phone] and follow the prompts.  For any non-urgent questions, you may also contact your provider using MyChart. We now offer e-Visits for anyone 18 and older to request care online for non-urgent symptoms. For details visit [email or web address].   Also download the MyChart app! Go to the app store, search "MyChart", open the app, select [facility], and log in with your MyChart username and password.   

## 2023-07-09 ENCOUNTER — Inpatient Hospital Stay: Payer: PPO

## 2023-07-09 VITALS — BP 122/61 | HR 86 | Temp 97.6°F

## 2023-07-09 DIAGNOSIS — E611 Iron deficiency: Secondary | ICD-10-CM

## 2023-07-09 DIAGNOSIS — N1831 Chronic kidney disease, stage 3a: Secondary | ICD-10-CM | POA: Diagnosis not present

## 2023-07-09 MED ORDER — SODIUM CHLORIDE 0.9 % IV SOLN
200.0000 mg | Freq: Once | INTRAVENOUS | Status: AC
  Administered 2023-07-09: 200 mg via INTRAVENOUS
  Filled 2023-07-09: qty 200

## 2023-07-09 MED ORDER — SODIUM CHLORIDE 0.9 % IV SOLN
Freq: Once | INTRAVENOUS | Status: AC
  Filled 2023-07-09: qty 250

## 2023-07-09 NOTE — Patient Instructions (Signed)
 Iron Sucrose Injection What is this medication? IRON SUCROSE (EYE ern SOO krose) treats low levels of iron (iron deficiency anemia) in people with kidney disease. Iron is a mineral that plays an important role in making red blood cells, which carry oxygen from your lungs to the rest of your body. This medicine may be used for other purposes; ask your health care provider or pharmacist if you have questions. COMMON BRAND NAME(S): Venofer What should I tell my care team before I take this medication? They need to know if you have any of these conditions: Anemia not caused by low iron levels Heart disease High levels of iron in the blood Kidney disease Liver disease An unusual or allergic reaction to iron, other medications, foods, dyes, or preservatives Pregnant or trying to get pregnant Breastfeeding How should I use this medication? This medication is for infusion into a vein. It is given in a hospital or clinic setting. Talk to your care team about the use of this medication in children. While this medication may be prescribed for children as young as 2 years for selected conditions, precautions do apply. Overdosage: If you think you have taken too much of this medicine contact a poison control center or emergency room at once. NOTE: This medicine is only for you. Do not share this medicine with others. What if I miss a dose? Keep appointments for follow-up doses. It is important not to miss your dose. Call your care team if you are unable to keep an appointment. What may interact with this medication? Do not take this medication with any of the following: Deferoxamine Dimercaprol Other iron products This medication may also interact with the following: Chloramphenicol Deferasirox This list may not describe all possible interactions. Give your health care provider a list of all the medicines, herbs, non-prescription drugs, or dietary supplements you use. Also tell them if you smoke,  drink alcohol, or use illegal drugs. Some items may interact with your medicine. What should I watch for while using this medication? Visit your care team regularly. Tell your care team if your symptoms do not start to get better or if they get worse. You may need blood work done while you are taking this medication. You may need to follow a special diet. Talk to your care team. Foods that contain iron include: whole grains/cereals, dried fruits, beans, or peas, leafy green vegetables, and organ meats (liver, kidney). What side effects may I notice from receiving this medication? Side effects that you should report to your care team as soon as possible: Allergic reactions--skin rash, itching, hives, swelling of the face, lips, tongue, or throat Low blood pressure--dizziness, feeling faint or lightheaded, blurry vision Shortness of breath Side effects that usually do not require medical attention (report to your care team if they continue or are bothersome): Flushing Headache Joint pain Muscle pain Nausea Pain, redness, or irritation at injection site This list may not describe all possible side effects. Call your doctor for medical advice about side effects. You may report side effects to FDA at 1-800-FDA-1088. Where should I keep my medication? This medication is given in a hospital or clinic. It will not be stored at home. NOTE: This sheet is a summary. It may not cover all possible information. If you have questions about this medicine, talk to your doctor, pharmacist, or health care provider.  2024 Elsevier/Gold Standard (2023-04-03 00:00:00)

## 2023-07-14 ENCOUNTER — Ambulatory Visit: Payer: PPO

## 2023-07-14 ENCOUNTER — Ambulatory Visit: Payer: PPO | Admitting: Internal Medicine

## 2023-07-14 ENCOUNTER — Other Ambulatory Visit: Payer: PPO

## 2023-08-12 DIAGNOSIS — S32401A Unspecified fracture of right acetabulum, initial encounter for closed fracture: Secondary | ICD-10-CM | POA: Diagnosis not present

## 2023-08-12 DIAGNOSIS — M25561 Pain in right knee: Secondary | ICD-10-CM | POA: Diagnosis not present

## 2023-08-12 DIAGNOSIS — Z780 Asymptomatic menopausal state: Secondary | ICD-10-CM | POA: Insufficient documentation

## 2023-08-12 DIAGNOSIS — S4991XA Unspecified injury of right shoulder and upper arm, initial encounter: Secondary | ICD-10-CM | POA: Diagnosis not present

## 2023-08-12 DIAGNOSIS — H209 Unspecified iridocyclitis: Secondary | ICD-10-CM | POA: Insufficient documentation

## 2023-08-12 DIAGNOSIS — Z886 Allergy status to analgesic agent status: Secondary | ICD-10-CM | POA: Diagnosis not present

## 2023-08-12 DIAGNOSIS — M19011 Primary osteoarthritis, right shoulder: Secondary | ICD-10-CM | POA: Diagnosis not present

## 2023-08-12 DIAGNOSIS — S32591A Other specified fracture of right pubis, initial encounter for closed fracture: Secondary | ICD-10-CM | POA: Diagnosis not present

## 2023-08-12 DIAGNOSIS — D631 Anemia in chronic kidney disease: Secondary | ICD-10-CM | POA: Diagnosis not present

## 2023-08-12 DIAGNOSIS — N189 Chronic kidney disease, unspecified: Secondary | ICD-10-CM | POA: Diagnosis not present

## 2023-08-17 ENCOUNTER — Telehealth: Payer: Self-pay | Admitting: Pain Medicine

## 2023-08-17 NOTE — Telephone Encounter (Signed)
Patient fell last week and fractured her pelvic to hip bone. She is unable to travel at this time. Has med mgmt appt on Wed. Can we change to VV and schedule one month out for F2F ? Please ask Dr Laban Emperor.

## 2023-08-17 NOTE — Progress Notes (Unsigned)
Patient: Cynthia Bennett  Service Category: E/M  Provider: Oswaldo Done, MD  DOB: [birth year]  DOS: 08/19/2023  Location: Office  MRN: [id number]  Setting: Ambulatory outpatient  Referring Provider: Jerl Mina, MD  Type: Established Patient  Specialty: Interventional Pain Management  PCP: Jerl Mina, MD  Location: Remote location  Delivery: TeleHealth     Virtual Encounter - Pain Management PROVIDER NOTE: Information contained herein reflects review and annotations entered in association with encounter. Interpretation of such information and data should be left to medically-trained personnel. Information provided to patient can be located elsewhere in the medical record under "Patient Instructions". Document created using STT-dictation technology, any transcriptional errors that may result from process are unintentional.    Contact & Pharmacy Preferred: [phone] Home: [phone] (home) Mobile: [phone] (mobile) E-mail: [name]  [facility] [place], [place] - [address] [place] [address] Phone: [phone] Fax: [phone]   Pre-screening  Cynthia Bennett offered "in-person" vs "virtual" encounter. She indicated preferring virtual for this encounter.   Reason COVID-19*  Social distancing based on CDC and AMA recommendations.   I contacted Cynthia Bennett on 08/19/2023 via telephone.      I clearly identified myself as Oswaldo Done, MD. I verified that I was speaking with the correct person using two identifiers (Name: Cynthia Bennett, and date of birth: [birth year]).  Consent I sought verbal advanced consent from Cynthia Bennett for virtual visit interactions. I informed Cynthia Bennett of possible security and privacy concerns, risks, and limitations associated with providing "not-in-person" medical evaluation and management services. I also informed Cynthia Bennett of the availability of "in-person" appointments. Finally, I informed her that there would be a  charge for the virtual visit and that she could be  personally, fully or partially, financially responsible for it. Cynthia Bennett expressed understanding and agreed to proceed.   Historic Elements   Cynthia Bennett is a 89 y.o. year old, female patient evaluated today after our last contact on 08/17/2023. Cynthia Bennett  has a past medical history of Dizzy spells, Fibromyalgia, Glaucoma, Hiatal hernia, Osteoarthritis, Osteoarthritis of spine with radiculopathy, lumbar region (08/14/2015), Peptic ulcer disease, Reflux, Spinal stenosis, Stroke (HCC), and TIA (transient ischemic attack). She also  has a past surgical history that includes Esophagogastroduodenoscopy (N/A, 04/16/2015); Appendectomy; and Abdominal hysterectomy. Cynthia Bennett has a current medication list which includes the following prescription(s): acetaminophen, amoxicillin, chlorhexidine, vitamin d3, cyanocobalamin, dorzolamide-timolol, doxepin, esomeprazole, magnesium oxide, ondansetron, [START ON 08/24/2023] oxycodone, sertraline, trazodone, zolpidem, and naloxone. She  reports that she has quit smoking. She has never used smokeless tobacco. She reports that she does not drink alcohol and does not use drugs. Cynthia Bennett is allergic to ibuprofen.  BMI: Estimated body mass index is 26.45 kg/m as calculated from the following:   Height as of 05/26/23: 5\' 1"  (1.549 m).   Weight as of 05/26/23: 140 lb (63.5 kg). Last encounter: 06/16/2023. Last procedure: 05/26/2023.  HPI  Today, she is being contacted for medication management.  Patient was initially scheduled to return for a face-to-face medication management encounter however she apparently fell and broke her pelvis.  She was evaluated in the emergency room and sent back home since there was apparently no surgical option.  She was provided with some additional pain medication, but today I spoke to the family and they indicated that if she takes 2 of the oxycodone's at the same time, then she gets very sleepy.   Therefore, I  have recommended that they cut them in half and perhaps use half of a pill every 6 hours, as needed.  In addition I have informed them on the risk of DVTs and pneumonias.  As it turns out they do have an incentive spirometry device at home and I have recommended that they start using that for approximately 10 minutes, twice a day.  In addition I recommended that they consult the orthopedic surgeon to see if she should be taking an 81 mg aspirin.  She is currently not on any blood thinners.  They describe that her only pain is when she tries to transfer to her wheelchair to go to the bathroom.  Other than that as long as she stays still her pain seems to be well-controlled.  Today I will be sending a prescription to her pharmacy and I will check up on her again in about 30 days.  RTCB: 09/23/2023   Pharmacotherapy Assessment   Opioid Analgesic: Oxycodone IR 5 mg, 1 tab PO daily (maximum of 5 mg/day of oxycodone.) MME/day: 7.5 mg/day.   Monitoring: [place] PMP: PDMP reviewed during this encounter.       Pharmacotherapy: No side-effects or adverse reactions reported. Compliance: No problems identified. Effectiveness: Clinically acceptable. Plan: Refer to "POC". UDS:  Summary  Date Value Ref Range Status  04/22/2023 Note  Final    Comment:    ==================================================================== ToxAS[employer] Select 13 (MW) ==================================================================== Specimen Alert Not Detected result may be consistent with the time of last use noted for this medication. AS NEEDED (Oxycodone) ==================================================================== Test                             Result       Flag       Units  Drug Absent but Declared for Prescription Verification   Oxycodone                      Not Detected UNEXPECTED ng/mg creat ==================================================================== Test                      Result     Flag   Units      Ref Range   Creatinine              65               mg/dL      >=34 ==================================================================== Declared Medications:  The flagging and interpretation on this report are based on the  following declared medications.  Unexpected results may arise from  inaccuracies in the declared medications.   **Note: The testing scope of this panel includes these medications:   Oxycodone   **Note: The testing scope of this panel does not include the  following reported medications:   Acetaminophen (Tylenol)  Doxepin (Sinequan)  Esomeprazole (Nexium)  Eye Drops  Magnesium (Mag-Ox)  Naloxone (Narcan)  Ondansetron (Zofran)  Sertraline (Zoloft)  Trazodone (Desyrel)  Vitamin D3  Zolpidem (Ambien) ==================================================================== For clinical consultation, please call [phone]. ====================================================================    No results found for: "CBDTHCR", "D8THCCBX", "D9THCCBX"   Laboratory Chemistry Profile   Renal Lab Results  Component Value Date   BUN 16 06/10/2023   CREATININE 0.95 06/10/2023   BCR 11 (L) 01/10/2019   GFRAA 46 (L) 01/10/2019   GFRNONAA 58 (L) 06/10/2023    Hepatic Lab Results  Component Value Date   AST 17 07/31/2022   ALT  10 07/31/2022   ALBUMIN 3.6 07/31/2022   ALKPHOS 54 07/31/2022   LIPASE 139 04/27/2014    Electrolytes Lab Results  Component Value Date   NA 135 06/10/2023   K 3.8 06/10/2023   CL 105 06/10/2023   CALCIUM 8.8 (L) 06/10/2023   MG 2.3 01/10/2019    Bone Lab Results  Component Value Date   25OHVITD1 26 (L) 01/10/2019   25OHVITD2 1.6 01/10/2019   25OHVITD3 24 01/10/2019    Inflammation (CRP: Acute Phase) (ESR: Chronic Phase) Lab Results  Component Value Date   CRP 0.9 08/24/2020   ESRSEDRATE 51 (H) 01/10/2019         Note: Above Lab results reviewed.  Imaging  DG PAIN CLINIC C-ARM 1-60 MIN NO  REPORT Fluoro was used, but no Radiologist interpretation will be provided.  Please refer to "NOTES" tab for provider progress note.  Assessment  The primary encounter diagnosis was Chronic pain syndrome. Diagnoses of Chronic low back pain (1ry area of Pain) (Bilateral) (L>R) w/o sciatica, Chronic lower extremity pain (2ry area of Pain) (Left), Chronic hip pain (3ry area of Pain) (Bilateral) (R>L), Severe (5-6 mm) L4-5 lumbar spinal stenosis and (8 mm) L3-4 spinal stenosis., Bertolotti's syndrome (Left), Lumbar facet hypertrophy (Bilateral), Grade 1 spondylolisthesis of L4 over L5, Chronic lumbar radicular pain (Left) (L5 Dermatome), Scoliosis of thoracolumbar region due to degenerative disease of spine in adult, DDD (degenerative disc disease), thoracolumbar, Pharmacologic therapy, Chronic use of opiate for therapeutic purpose, Encounter for medication management, and Encounter for chronic pain management were also pertinent to this visit.  Plan of Care  Problem-specific:  No problem-specific Assessment & Plan notes found for this encounter.  Cynthia Bennett has a current medication list which includes the following long-term medication(s): doxepin, [START ON 08/24/2023] oxycodone, and zolpidem.  Pharmacotherapy (Medications Ordered): Meds ordered this encounter  Medications   naloxone (NARCAN) nasal spray 4 mg/0.1 mL    Sig: Place 1 spray into the nose as needed for up to 365 doses (for opioid-induced respiratory depresssion). In case of emergency (overdose), spray once into each nostril. If no response within 3 minutes, repeat application and call 911.    Dispense:  1 each    Refill:  0    Instruct patient in proper use of device.   oxyCODONE (OXY IR/ROXICODONE) 5 MG immediate release tablet    Sig: Take 1 tablet (5 mg total) by mouth daily as needed for severe pain. Must last 30 days    Dispense:  30 tablet    Refill:  0    DO NOT: delete (not duplicate); no partial-fill (will deny  script to complete), no refill request (F/U required). DISPENSE: 1 day early if closed on fill date. WARN: No CNS-depressants within 8 hrs of med.   Orders:  No orders of the defined types were placed in this encounter.  Follow-up plan:   Return in about 5 weeks (around 09/23/2023) for Eval-day (M,W), (F2F), (MM).      Interventional Therapies  Risk Factors  Complex Considerations:  Advanced age   Planned  Pending:   Diagnostic/therapeutic left L4-5 and L5-S1 lumbar facet MBB    Under consideration:   Diagnostic/therapeutic left L4-5 and L5-S1 lumbar facet MBB  Possible lumbar facet RFA    Completed:   Diagnostic left L5-S1 pseudoarthrosis inj. #1 (01/09/2022) (10/10 to 0/10)  Therapeutic left L4-5 LESI x8 (06/25/2021) (100/100/80/90)  Therapeutic right L4-5 LESI x1 (07/21/2017) (DNR-F/U)  Palliative right L5-S1 LESI x1 (09/27/2015) (100/100/90)  Therapeutic left L5-S1 LESI x1 (02/05/2016) (100/100/100/100)  Diagnostic right SI joint injection x2 (08/25/2019) (100/100/40/30)  Diagnostic left SI joint injection x2 (01/27/2019) (100/100/100/100)  Diagnostic left glenohumeral joint inj. x1 (04/29/2017) (100/100/98/98)  Left biceps muscle trigger point inj. x1 (07/21/2017) (DNR-F/U)  Right PSIS trigger point inj. x1 (07/27/2018) (100/100/50/>50)  Diagnostic right lumbar facet MBB x3 (01/09/2022) (10/10 to 0/10)  Diagnostic left lumbar facet MBB x4 (01/09/2022) (10/10 to 0/10)  Diagnostic left IA hip inj. x1 (01/27/2019) (100/100/100/100)    Completed by other providers:   None at this time   Therapeutic  Palliative (PRN) options:   None established      Recent Visits Date Type Provider Dept  06/16/23 Office Visit Delano Metz, MD [facility]  05/26/23 Procedure visit Delano Metz, MD [facility]  Showing recent visits within past 90 days and meeting all other requirements Today's Visits Date Type Provider Dept  08/19/23 Office Visit Delano Metz, MD [facility]  Showing today's visits and meeting all other requirements Future Appointments No visits were found meeting these conditions. Showing future appointments within next 90 days and meeting all other requirements  I discussed the assessment and treatment plan with the patient. The patient was provided an opportunity to ask questions and all were answered. The patient agreed with the plan and demonstrated an understanding of the instructions.  Patient advised to call back or seek an in-person evaluation if the symptoms or condition worsens.  Duration of encounter: 15 minutes.  Note by: Oswaldo Done, MD Date: 08/19/2023; Time: 10:44 AM

## 2023-08-18 NOTE — Patient Instructions (Signed)
____________________________________________________________________________________________  Opioid Pain Medication Update  To: All patients taking opioid pain medications. (I.e.: hydrocodone, hydromorphone, oxycodone, oxymorphone, morphine, codeine, methadone, tapentadol, tramadol, buprenorphine, fentanyl, etc.)  Re: Updated review of side effects and adverse reactions of opioid analgesics, as well as new information about long term effects of this class of medications.  Direct risks of long-term opioid therapy are not limited to opioid addiction and overdose. Potential medical risks include serious fractures, breathing problems during sleep, hyperalgesia, immunosuppression, chronic constipation, bowel obstruction, myocardial infarction, and tooth decay secondary to xerostomia.  Unpredictable adverse effects that can occur even if you take your medication correctly: Cognitive impairment, respiratory depression, and death. Most people think that if they take their medication "correctly", and "as instructed", that they will be safe. Nothing could be farther from the truth. In reality, a significant amount of recorded deaths associated with the use of opioids has occurred in individuals that had taken the medication for a long time, and were taking their medication correctly. The following are examples of how this can happen: Patient taking his/her medication for a long time, as instructed, without any side effects, is given a certain antibiotic or another unrelated medication, which in turn triggers a "Drug-to-drug interaction" leading to disorientation, cognitive impairment, impaired reflexes, respiratory depression or an untoward event leading to serious bodily harm or injury, including death.  Patient taking his/her medication for a long time, as instructed, without any side effects, develops an acute impairment of liver and/or kidney function. This will lead to a rapid inability of the body to  breakdown and eliminate their pain medication, which will result in effects similar to an "overdose", but with the same medicine and dose that they had always taken. This again may lead to disorientation, cognitive impairment, impaired reflexes, respiratory depression or an untoward event leading to serious bodily harm or injury, including death.  A similar problem will occur with patients as they grow older and their liver and kidney function begins to decrease as part of the aging process.  Background information: Historically, the original case for using long-term opioid therapy to treat chronic noncancer pain was based on safety assumptions that subsequent experience has called into question. In 1996, the American Pain Society and the American Academy of Pain Medicine issued a consensus statement supporting long-term opioid therapy. This statement acknowledged the dangers of opioid prescribing but concluded that the risk for addiction was low; respiratory depression induced by opioids was short-lived, occurred mainly in opioid-naive patients, and was antagonized by pain; tolerance was not a common problem; and efforts to control diversion should not constrain opioid prescribing. This has now proven to be wrong. Experience regarding the risks for opioid addiction, misuse, and overdose in community practice has failed to support these assumptions.  According to the Centers for Disease Control and Prevention, fatal overdoses involving opioid analgesics have increased sharply over the past decade. Currently, more than 96,700 people die from drug overdoses every year. Opioids are a factor in 7 out of every 10 overdose deaths. Deaths from drug overdose have surpassed motor vehicle accidents as the leading cause of death for individuals between the ages of 80 and 61.  Clinical data suggest that neuroendocrine dysfunction may be very common in both men and women, potentially causing hypogonadism, erectile  dysfunction, infertility, decreased libido, osteoporosis, and depression. Recent studies linked higher opioid dose to increased opioid-related mortality. Controlled observational studies reported that long-term opioid therapy may be associated with increased risk for cardiovascular events. Subsequent meta-analysis concluded  that the safety of long-term opioid therapy in elderly patients has not been proven.   Side Effects and adverse reactions: Common side effects: Drowsiness (sedation). Dizziness. Nausea and vomiting. Constipation. Physical dependence -- Dependence often manifests with withdrawal symptoms when opioids are discontinued or decreased. Tolerance -- As you take repeated doses of opioids, you require increased medication to experience the same effect of pain relief. Respiratory depression -- This can occur in healthy people, especially with higher doses. However, people with COPD, asthma or other lung conditions may be even more susceptible to fatal respiratory impairment.  Uncommon side effects: An increased sensitivity to feeling pain and extreme response to pain (hyperalgesia). Chronic use of opioids can lead to this. Delayed gastric emptying (the process by which the contents of your stomach are moved into your small intestine). Muscle rigidity. Immune system and hormonal dysfunction. Quick, involuntary muscle jerks (myoclonus). Arrhythmia. Itchy skin (pruritus). Dry mouth (xerostomia).  Long-term side effects: Chronic constipation. Sleep-disordered breathing (SDB). Increased risk of bone fractures. Hypothalamic-pituitary-adrenal dysregulation. Increased risk of overdose.  RISKS: Respiratory depression and death: Opioids increase the risk of respiratory depression and death.  Drug-to-drug interactions: Opioids are relatively contraindicated in combination with benzodiazepines, sleep inducers, and other central nervous system depressants. Other classes of medications  (i.e.: certain antibiotics and even over-the-counter medications) may also trigger or induce respiratory depression in some patients.  Medical conditions: Patients with pre-existing respiratory problems are at higher risk of respiratory failure and/or depression when in combination with opioid analgesics. Opioids are relatively contraindicated in some medical conditions such as central sleep apnea.   Fractures and Falls:  Opioids increase the risk and incidence of falls. This is of particular importance in elderly patients.  Endocrine System:  Long-term administration is associated with endocrine abnormalities (endocrinopathies). (Also known as Opioid-induced Endocrinopathy) Influences on both the hypothalamic-pituitary-adrenal axis?and the hypothalamic-pituitary-gonadal axis have been demonstrated with consequent hypogonadism and adrenal insufficiency in both sexes. Hypogonadism and decreased levels of dehydroepiandrosterone sulfate have been reported in men and women. Endocrine effects include: Amenorrhoea in women (abnormal absence of menstruation) Reduced libido in both sexes Decreased sexual function Erectile dysfunction in men Hypogonadisms (decreased testicular function with shrinkage of testicles) Infertility Depression and fatigue Loss of muscle mass Anxiety Depression Immune suppression Hyperalgesia Weight gain Anemia Osteoporosis Patients (particularly women of childbearing age) should avoid opioids. There is insufficient evidence to recommend routine monitoring of asymptomatic patients taking opioids in the long-term for hormonal deficiencies.  Immune System: Human studies have demonstrated that opioids have an immunomodulating effect. These effects are mediated via opioid receptors both on immune effector cells and in the central nervous system. Opioids have been demonstrated to have adverse effects on antimicrobial response and anti-tumour surveillance. Buprenorphine has  been demonstrated to have no impact on immune function.  Opioid Induced Hyperalgesia: Human studies have demonstrated that prolonged use of opioids can lead to a state of abnormal pain sensitivity, sometimes called opioid induced hyperalgesia (OIH). Opioid induced hyperalgesia is not usually seen in the absence of tolerance to opioid analgesia. Clinically, hyperalgesia may be diagnosed if the patient on long-term opioid therapy presents with increased pain. This might be qualitatively and anatomically distinct from pain related to disease progression or to breakthrough pain resulting from development of opioid tolerance. Pain associated with hyperalgesia tends to be more diffuse than the pre-existing pain and less defined in quality. Management of opioid induced hyperalgesia requires opioid dose reduction.  Cancer: Chronic opioid therapy has been associated with an increased risk of cancer  among noncancer patients with chronic pain. This association was more evident in chronic strong opioid users. Chronic opioid consumption causes significant pathological changes in the small intestine and colon. Epidemiological studies have found that there is a link between opium dependence and initiation of gastrointestinal cancers. Cancer is the second leading cause of death after cardiovascular disease. Chronic use of opioids can cause multiple conditions such as GERD, immunosuppression and renal damage as well as carcinogenic effects, which are associated with the incidence of cancers.   Mortality: Long-term opioid use has been associated with increased mortality among patients with chronic non-cancer pain (CNCP).  Prescription of long-acting opioids for chronic noncancer pain was associated with a significantly increased risk of all-cause mortality, including deaths from causes other than overdose.  Reference: Von Korff M, Kolodny A, Deyo RA, Chou R. Long-term opioid therapy reconsidered. Ann Intern Med. 2011  Sep 6;155(5):325-8. doi: 10.7326/[id number]. PMID: 64403474; PMCID: [id number]. Randon Goldsmith, Hayward RA, Dunn KM, [place] KP. Risk of adverse events in patients prescribed long-term opioids: A cohort study in the [place] [facility] Research Datalink. Eur J Pain. 2019 May;23(5):908-922. doi: 10.1002/ejp.1357. Epub 2019 Jan 31. PMID: 43329518. Colameco S, Coren JS, Ciervo CA. Continuous opioid treatment for chronic noncancer pain: a time for moderation in prescribing. Postgrad Med. 2009 Jul;121(4):61-6. doi: 10.3810/pgm.2009.07.2032. PMID: 84166063. William Hamburger RN, [place] SD, Blazina I, Cristopher Peru, Bougatsos C, Deyo RA. The effectiveness and risks of long-term opioid therapy for chronic pain: a systematic review for a [employer] of [employer] Pathways to [employer]. Ann Intern Med. 2015 Feb 17;162(4):276-86. doi: 10.7326/M14-2559. PMID: 01601093. Caryl Bis [facility], Makuc DM. NCHS Data Brief No. 22. [place]: Centers for Disease Control and Prevention; 2009. Sep, Increase in Fatal Poisonings Involving Opioid Analgesics in the [place], 1999-2006. Song IA, Choi HR, Oh TK. Long-term opioid use and mortality in patients with chronic non-cancer pain: Ten-year follow-up study in [place] from 2010 through 2019. EClinicalMedicine. 2022 Jul 18;51:101558. doi: 10.1016/j.eclinm.2022.235573. PMID: 22025427; PMCID: [id number]. Huser, W., Schubert, T., Vogelmann, T. et al. All-cause mortality in patients with long-term opioid therapy compared with non-opioid analgesics for chronic non-cancer pain: a database study. BMC Med 18, 162 (2020). http://lester.info/ Rashidian H, Karie Kirks, Malekzadeh R, Haghdoost AA. An Ecological Study of the Association between Opiate Use and Incidence of Cancers. Addict Health. 2016 Fall;8(4):252-260. PMID: 51761607; PMCID: [id number].  Our Goal: Our goal is to control your  pain with means other than the use of opioid pain medications.  Our Recommendation: Talk to your physician about coming off of these medications. We can assist you with the tapering down and stopping these medicines. Based on the new information, even if you cannot completely stop the medication, a decrease in the dose may be associated with a lesser risk. Ask for other means of controlling the pain. Decrease or eliminate those factors that significantly contribute to your pain such as smoking, obesity, and a diet heavily tilted towards "inflammatory" nutrients.  Last Updated: 05/18/2023   ____________________________________________________________________________________________     ____________________________________________________________________________________________  National Pain Medication Shortage  The [place] is experiencing worsening drug shortages. These have had a negative widespread effect on patient care and treatment. Not expected to improve any time soon. Predicted to last past 2029.   Drug shortage list (generic names) Oxycodone IR Oxycodone/APAP Oxymorphone IR Hydromorphone Hydrocodone/APAP Morphine  Where is the problem?  Manufacturing and supply level.  Will this shortage affect you?  Only if you  take any of the above pain medications.  How? You may be unable to fill your prescription.  Your pharmacist may offer a "partial fill" of your prescription. (Warning: Do not accept partial fills.) Prescriptions partially filled cannot be transferred to another pharmacy. Read our Medication Rules and Regulation. Depending on how much medicine you are dependent on, you may experience withdrawals when unable to get the medication.  Recommendations: Consider ending your dependence on opioid pain medications. Ask your pain specialist to assist you with the process. Consider switching to a medication currently not in shortage, such as Buprenorphine. Talk to your pain  specialist about this option. Consider decreasing your pain medication requirements by managing tolerance thru "Drug Holidays". This may help minimize withdrawals, should you run out of medicine. Control your pain thru the use of non-pharmacological interventional therapies.   Your prescriber: Prescribers cannot be blamed for shortages. Medication manufacturing and supply issues cannot be fixed by the prescriber.   NOTE: The prescriber is not responsible for supplying the medication, or solving supply issues. Work with your pharmacist to solve it. The patient is responsible for the decision to take or continue taking the medication and for identifying and securing a legal supply source. By law, supplying the medication is the job and responsibility of the pharmacy. The prescriber is responsible for the evaluation, monitoring, and prescribing of these medications.   Prescribers will NOT: Re-issue prescriptions that have been partially filled. Re-issue prescriptions already sent to a pharmacy.  Re-send prescriptions to a different pharmacy because yours did not have your medication. Ask pharmacist to order more medicine or transfer the prescription to another pharmacy. (Read below.)  New 2023 regulation: "July 11, 2022 Revised Regulation Allows DEA-Registered Pharmacies to Transfer Electronic Prescriptions at a Patient's Request DEA Headquarters Division - Public Information Office Patients now have the ability to request their electronic prescription be transferred to another pharmacy without having to go back to their practitioner to initiate the request. This revised regulation went into effect on Monday, July 07, 2022.     At a patient's request, a DEA-registered retail pharmacy can now transfer an electronic prescription for a controlled substance (schedules II-V) to another DEA-registered retail pharmacy. Prior to this change, patients would have to go through their practitioner to  cancel their prescription and have it re-issued to a different pharmacy. The process was taxing and time consuming for both patients and practitioners.    The Drug Enforcement Administration [facility]) published its intent to revise the process for transferring electronic prescriptions on September 28, 2020.  The final rule was published in the federal register on June 05, 2022 and went into effect 30 days later.  Under the final rule, a prescription can only be transferred once between pharmacies, and only if allowed under existing state or other applicable law. The prescription must remain in its electronic form; may not be altered in any way; and the transfer must be communicated directly between two licensed pharmacists. It's important to note, any authorized refills transfer with the original prescription, which means the entire prescription will be filled at the same pharmacy".  Reference: [email or web address].is [facility] website announcement)  [email or web address] [employer] of Justice)   [employer] / Vol. 88, No. 143 / Thursday, June 05, 2022 / Rules and Regulations DEPARTMENT OF JUSTICE  Drug Enforcement Administration  21 CFR Part 1306  [Docket No. [id number]]  RIN [id number] Transfer of Electronic Prescriptions for Schedules II-V Controlled Substances Between Pharmacies for Initial Filling  ____________________________________________________________________________________________  ____________________________________________________________________________________________  Transfer of Pain Medication between Pharmacies  Re: 2023 DEA Clarification on existing regulation  Published on DEA Website: July 11, 2022  Title: Revised Regulation Allows DEA-Registered Pharmacies to Electrical engineer Prescriptions at a Patient's  Request DEA Headquarters Division - [employer]  "Patients now have the ability to request their electronic prescription be transferred to another pharmacy without having to go back to their practitioner to initiate the request. This revised regulation went into effect on Monday, July 07, 2022.     At a patient's request, a DEA-registered retail pharmacy can now transfer an electronic prescription for a controlled substance (schedules II-V) to another DEA-registered retail pharmacy. Prior to this change, patients would have to go through their practitioner to cancel their prescription and have it re-issued to a different pharmacy. The process was taxing and time consuming for both patients and practitioners.    The Drug Enforcement Administration [facility]) published its intent to revise the process for transferring electronic prescriptions on September 28, 2020.  The final rule was published in the federal register on June 05, 2022 and went into effect 30 days later.  Under the final rule, a prescription can only be transferred once between pharmacies, and only if allowed under existing state or other applicable law. The prescription must remain in its electronic form; may not be altered in any way; and the transfer must be communicated directly between two licensed pharmacists. It's important to note, any authorized refills transfer with the original prescription, which means the entire prescription will be filled at the same pharmacy."    REFERENCES: 1. DEA website announcement [email or web address]  2. Department of Justice website  [email or web address]  3. DEPARTMENT OF JUSTICE Drug Enforcement Administration 21 CFR Part 1306 [Docket No. [id number]] RIN [id number] "Transfer of Electronic Prescriptions for Schedules II-V Controlled Substances  Between Pharmacies for Initial Filling"  ____________________________________________________________________________________________     _______________________________________________________________________  Medication Rules  Purpose: To inform patients, and their family members, of our medication rules and regulations.  Applies to: All patients receiving prescriptions from our practice (written or electronic).  Pharmacy of record: This is the pharmacy where your electronic prescriptions will be sent. Make sure we have the correct one.  Electronic prescriptions: In compliance with the [facility] Strengthen Opioid Misuse Prevention (STOP) Act of 2017 (Session Conni Elliot [phone]), effective November 10, 2018, all controlled substances must be electronically prescribed. Written prescriptions, faxing, or calling prescriptions to a pharmacy will no longer be done.  Prescription refills: These will be provided only during in-person appointments. No medications will be renewed without a "face-to-face" evaluation with your provider. Applies to all prescriptions.  NOTE: The following applies primarily to controlled substances (Opioid* Pain Medications).   Type of encounter (visit): For patients receiving controlled substances, face-to-face visits are required. (Not an option and not up to the patient.)  Patient's responsibilities: Pain Pills: Bring all pain pills to every appointment (except for procedure appointments). Pill Bottles: Bring pills in original pharmacy bottle. Bring bottle, even if empty. Always bring the bottle of the most recent fill.  Medication refills: You are responsible for knowing and keeping track of what medications you are taking and when is it that you will need a refill. The day before your appointment: write a list of all prescriptions that need to be refilled. The day of the appointment: give the list to the admitting nurse. Prescriptions will be written only  during appointments. No prescriptions will be written on procedure days. If you forget a  medication: it will not be "Called in", "Faxed", or "electronically sent". You will need to get another appointment to get these prescribed. No early refills. Do not call asking to have your prescription filled early. Partial  or short prescriptions: Occasionally your pharmacy may not have enough pills to fill your prescription.  NEVER ACCEPT a partial fill or a prescription that is short of the total amount of pills that you were prescribed.  With controlled substances the law allows 72 hours for the pharmacy to complete the prescription.  If the prescription is not completed within 72 hours, the pharmacist will require a new prescription to be written. This means that you will be short on your medicine and we WILL NOT send another prescription to complete your original prescription.  Instead, request the pharmacy to send a carrier to a nearby branch to get enough medication to provide you with your full prescription. Prescription Accuracy: You are responsible for carefully inspecting your prescriptions before leaving our office. Have the discharge nurse carefully go over each prescription with you, before taking them home. Make sure that your name is accurately spelled, that your address is correct. Check the name and dose of your medication to make sure it is accurate. Check the number of pills, and the written instructions to make sure they are clear and accurate. Make sure that you are given enough medication to last until your next medication refill appointment. Taking Medication: Take medication as prescribed. When it comes to controlled substances, taking less pills or less frequently than prescribed is permitted and encouraged. Never take more pills than instructed. Never take the medication more frequently than prescribed.  Inform other Doctors: Always inform, all of your healthcare providers, of all the  medications you take. Pain Medication from other Providers: You are not allowed to accept any additional pain medication from any other Doctor or Healthcare provider. There are two exceptions to this rule. (see below) In the event that you require additional pain medication, you are responsible for notifying us, as stated below. Cough Medicine: Often these contain an opioid, such as codeine or hydrocodone. Never accept or take cough medicine containing these opioids if you are already taking an opioid* medication. The combination may cause respiratory failure and death. Medication Agreement: You are responsible for carefully reading and following our Medication Agreement. This must be signed before receiving any prescriptions from our practice. Safely store a copy of your signed Agreement. Violations to the Agreement will result in no further prescriptions. (Additional copies of our Medication Agreement are available upon request.) Laws, Rules, & Regulations: All patients are expected to follow all [address] and [employer], [employer], Rules, [place] [employer]. Ignorance of the Laws does not constitute a valid excuse.  Illegal drugs and Controlled Substances: The use of illegal substances (including, but not limited to marijuana and its derivatives) and/or the illegal use of any controlled substances is strictly prohibited. Violation of this rule may result in the immediate and permanent discontinuation of any and all prescriptions being written by our practice. The use of any illegal substances is prohibited. Adopted CDC guidelines & recommendations: Target dosing levels will be at or below 60 MME/day. Use of benzodiazepines** is not recommended.  Exceptions: There are only two exceptions to the rule of not receiving pain medications from other Healthcare Providers. Exception #1 (Emergencies): In the event of an emergency (i.e.: accident requiring emergency care), you are allowed to receive additional pain  medication. However, you are responsible for: As soon as  you are able, call our office [phone], at any time of the day or night, and leave a message stating your name, the date and nature of the emergency, and the name and dose of the medication prescribed. In the event that your call is answered by a member of our staff, make sure to document and save the date, time, and the name of the person that took your information.  Exception #2 (Planned Surgery): In the event that you are scheduled by another doctor or dentist to have any type of surgery or procedure, you are allowed (for a period no longer than 30 days), to receive additional pain medication, for the acute post-op pain. However, in this case, you are responsible for picking up a copy of our "Post-op Pain Management for Surgeons" handout, and giving it to your surgeon or dentist. This document is available at our office, and does not require an appointment to obtain it. Simply go to our office during business hours (Monday-Thursday from 8:00 AM to 4:00 PM) (Friday 8:00 AM to 12:00 Noon) or if you have a scheduled appointment with [place], prior to your surgery, and ask for it by name. In addition, you are responsible for: calling our office [phone], at any time of the day or night, and leaving a message stating your name, name of your surgeon, type of surgery, and date of procedure or surgery. Failure to comply with your responsibilities may result in termination of therapy involving the controlled substances. Medication Agreement Violation. Following the above rules, including your responsibilities will help you in avoiding a Medication Agreement Violation ("Breaking your Pain Medication Contract").  Consequences:  Not following the above rules may result in permanent discontinuation of medication prescription therapy.  *Opioid medications include: morphine, codeine, oxycodone, oxymorphone, hydrocodone, hydromorphone, meperidine, tramadol,  tapentadol, buprenorphine, fentanyl, methadone. **Benzodiazepine medications include: diazepam (Valium), alprazolam (Xanax), clonazepam (Klonopine), lorazepam (Ativan), clorazepate (Tranxene), chlordiazepoxide (Librium), estazolam (Prosom), oxazepam (Serax), temazepam (Restoril), triazolam (Halcion) (Last updated: 09/02/2022) ______________________________________________________________________    ______________________________________________________________________  Medication Recommendations and Reminders  Applies to: All patients receiving prescriptions (written and/or electronic).  Medication Rules & Regulations: You are responsible for reading, knowing, and following our "Medication Rules" document. These exist for your safety and that of others. They are not flexible and neither are we. Dismissing or ignoring them is an act of "non-compliance" that may result in complete and irreversible termination of such medication therapy. For safety reasons, "non-compliance" will not be tolerated. As with the [place] fundamental legal principle of "ignorance of the law is no defense", we will accept no excuses for not having read and knowing the content of documents provided to you by our practice.  Pharmacy of record:  Definition: This is the pharmacy where your electronic prescriptions will be sent.  We do not endorse any particular pharmacy. It is up to you and your insurance to decide what pharmacy to use.  We do not restrict you in your choice of pharmacy. However, once we write for your prescriptions, we will NOT be re-sending more prescriptions to fix restricted supply problems created by your pharmacy, or your insurance.  The pharmacy listed in the electronic medical record should be the one where you want electronic prescriptions to be sent. If you choose to change pharmacy, simply notify our nursing staff. Changes will be made only during your regular appointments and not over the  phone.  Recommendations: Keep all of your pain medications in a safe place, under lock and key, even  if you live alone. We will NOT replace lost, stolen, or damaged medication. We do not accept "Police Reports" as proof of medications having been stolen. After you fill your prescription, take 1 week's worth of pills and put them away in a safe place. You should keep a separate, properly labeled bottle for this purpose. The remainder should be kept in the original bottle. Use this as your primary supply, until it runs out. Once it's gone, then you know that you have 1 week's worth of medicine, and it is time to come in for a prescription refill. If you do this correctly, it is unlikely that you will ever run out of medicine. To make sure that the above recommendation works, it is very important that you make sure your medication refill appointments are scheduled at least 1 week before you run out of medicine. To do this in an effective manner, make sure that you do not leave the office without scheduling your next medication management appointment. Always ask the nursing staff to show you in your prescription , when your medication will be running out. Then arrange for the receptionist to get you a return appointment, at least 7 days before you run out of medicine. Do not wait until you have 1 or 2 pills left, to come in. This is very poor planning and does not take into consideration that we may need to cancel appointments due to bad weather, sickness, or emergencies affecting our staff. DO NOT ACCEPT A "Partial Fill": If for any reason your pharmacy does not have enough pills/tablets to completely fill or refill your prescription, do not allow for a "partial fill". The law allows the pharmacy to complete that prescription within 72 hours, without requiring a new prescription. If they do not fill the rest of your prescription within those 72 hours, you will need a separate prescription to fill the remaining  amount, which we will NOT provide. If the reason for the partial fill is your insurance, you will need to talk to the pharmacist about payment alternatives for the remaining tablets, but again, DO NOT ACCEPT A PARTIAL FILL, unless you can trust your pharmacist to obtain the remainder of the pills within 72 hours.  Prescription refills and/or changes in medication(s):  Prescription refills, and/or changes in dose or medication, will be conducted only during scheduled medication management appointments. (Applies to both, written and electronic prescriptions.) No refills on procedure days. No medication will be changed or started on procedure days. No changes, adjustments, and/or refills will be conducted on a procedure day. Doing so will interfere with the diagnostic portion of the procedure. No phone refills. No medications will be "called into the pharmacy". No Fax refills. No weekend refills. No Holliday refills. No after hours refills.  Remember:  Business hours are:  Monday to Thursday 8:00 AM to 4:00 PM Provider's Schedule: Delano Metz, MD - Appointments are:  Medication management: Monday and Wednesday 8:00 AM to 4:00 PM Procedure day: Tuesday and Thursday 7:30 AM to 4:00 PM Edward Jolly, MD - Appointments are:  Medication management: Tuesday and Thursday 8:00 AM to 4:00 PM Procedure day: Monday and Wednesday 7:30 AM to 4:00 PM (Last update: 09/02/2022) ______________________________________________________________________   ____________________________________________________________________________________________  Naloxone Nasal Spray  Why am I receiving this medication? [place] STOP ACT requires that all patients taking high dose opioids or at risk of opioids respiratory depression, be prescribed an opioid reversal agent, such as Naloxone (AKA: Narcan).  What is this medication? NALOXONE (  nal OX one) treats opioid overdose, which causes slow or shallow breathing,  severe drowsiness, or trouble staying awake. Call emergency services after using this medication. You may need additional treatment. Naloxone works by reversing the effects of opioids. It belongs to a group of medications called opioid blockers.  COMMON BRAND NAME(S): Kloxxado, Narcan  What should I tell my care team before I take this medication? They need to know if you have any of these conditions: Heart disease Substance use disorder An unusual or allergic reaction to naloxone, other medications, foods, dyes, or preservatives Pregnant or trying to get pregnant Breast-feeding  When to use this medication? This medication is to be used for the treatment of respiratory depression (less than 8 breaths per minute) secondary to opioid overdose.   How to use this medication? This medication is for use in the nose. Lay the person on their back. Support their neck with your hand and allow the head to tilt back before giving the medication. The nasal spray should be given into 1 nostril. After giving the medication, move the person onto their side. Do not remove or test the nasal spray until ready to use. Get emergency medical help right away after giving the first dose of this medication, even if the person wakes up. You should be familiar with how to recognize the signs and symptoms of a narcotic overdose. If more doses are needed, give the additional dose in the other nostril. Talk to your care team about the use of this medication in children. While this medication may be prescribed for children as young as newborns for selected conditions, precautions do apply.  Naloxone Overdosage: If you think you have taken too much of this medicine contact a poison control center or emergency room at once.  NOTE: This medicine is only for you. Do not share this medicine with others.  What if I miss a dose? This does not apply.  What may interact with this medication? This is only used during an  emergency. No interactions are expected during emergency use. This list may not describe all possible interactions. Give your health care provider a list of all the medicines, herbs, non-prescription drugs, or dietary supplements you use. Also tell them if you smoke, drink alcohol, or use illegal drugs. Some items may interact with your medicine.  What should I watch for while using this medication? Keep this medication ready for use in the case of an opioid overdose. Make sure that you have the phone number of your care team and local hospital ready. You may need to have additional doses of this medication. Each nasal spray contains a single dose. Some emergencies may require additional doses. After use, bring the treated person to the nearest hospital or call 911. Make sure the treating care team knows that the person has received a dose of this medication. You will receive additional instructions on what to do during and after use of this medication before an emergency occurs.  What side effects may I notice from receiving this medication? Side effects that you should report to your care team as soon as possible: Allergic reactions--skin rash, itching, hives, swelling of the face, lips, tongue, or throat Side effects that usually do not require medical attention (report these to your care team if they continue or are bothersome): Constipation Dryness or irritation inside the nose Headache Increase in blood pressure Muscle spasms Stuffy nose Toothache This list may not describe all possible side effects. Call your doctor for  medical advice about side effects. You may report side effects to FDA at 1-800-FDA-1088.  Where should I keep my medication? Because this is an emergency medication, you should keep it with you at all times.  Keep out of the reach of children and pets. Store between 20 and 25 degrees C (68 and 77 degrees F). Do not freeze. Throw away any unused medication after the  expiration date. Keep in original box until ready to use.  NOTE: This sheet is a summary. It may not cover all possible information. If you have questions about this medicine, talk to your doctor, pharmacist, or health care provider.   2023 [employer]/Gold Standard (2021-07-05 00:00:00)  ____________________________________________________________________________________________

## 2023-08-19 ENCOUNTER — Ambulatory Visit: Payer: PPO | Attending: Pain Medicine | Admitting: Pain Medicine

## 2023-08-19 DIAGNOSIS — M545 Low back pain, unspecified: Secondary | ICD-10-CM

## 2023-08-19 DIAGNOSIS — G894 Chronic pain syndrome: Secondary | ICD-10-CM | POA: Diagnosis not present

## 2023-08-19 DIAGNOSIS — M5416 Radiculopathy, lumbar region: Secondary | ICD-10-CM | POA: Diagnosis not present

## 2023-08-19 DIAGNOSIS — M25551 Pain in right hip: Secondary | ICD-10-CM

## 2023-08-19 DIAGNOSIS — M4155 Other secondary scoliosis, thoracolumbar region: Secondary | ICD-10-CM | POA: Diagnosis not present

## 2023-08-19 DIAGNOSIS — M48062 Spinal stenosis, lumbar region with neurogenic claudication: Secondary | ICD-10-CM | POA: Diagnosis not present

## 2023-08-19 DIAGNOSIS — M5135 Other intervertebral disc degeneration, thoracolumbar region: Secondary | ICD-10-CM | POA: Diagnosis not present

## 2023-08-19 DIAGNOSIS — M25552 Pain in left hip: Secondary | ICD-10-CM

## 2023-08-19 DIAGNOSIS — M4316 Spondylolisthesis, lumbar region: Secondary | ICD-10-CM | POA: Diagnosis not present

## 2023-08-19 DIAGNOSIS — Z79899 Other long term (current) drug therapy: Secondary | ICD-10-CM

## 2023-08-19 DIAGNOSIS — M47816 Spondylosis without myelopathy or radiculopathy, lumbar region: Secondary | ICD-10-CM | POA: Diagnosis not present

## 2023-08-19 DIAGNOSIS — Z79891 Long term (current) use of opiate analgesic: Secondary | ICD-10-CM

## 2023-08-19 DIAGNOSIS — Q7649 Other congenital malformations of spine, not associated with scoliosis: Secondary | ICD-10-CM

## 2023-08-19 DIAGNOSIS — G8929 Other chronic pain: Secondary | ICD-10-CM

## 2023-08-19 DIAGNOSIS — M79605 Pain in left leg: Secondary | ICD-10-CM

## 2023-08-19 MED ORDER — NALOXONE HCL 4 MG/0.1ML NA LIQD
1.0000 | NASAL | 0 refills | Status: AC | PRN
Start: 2023-08-19 — End: 2024-09-13

## 2023-08-19 MED ORDER — OXYCODONE HCL 5 MG PO TABS
5.0000 mg | ORAL_TABLET | Freq: Every day | ORAL | 0 refills | Status: DC | PRN
Start: 2023-08-24 — End: 2023-09-23

## 2023-08-27 DIAGNOSIS — S32501A Unspecified fracture of right pubis, initial encounter for closed fracture: Secondary | ICD-10-CM | POA: Diagnosis not present

## 2023-08-27 DIAGNOSIS — S32401A Unspecified fracture of right acetabulum, initial encounter for closed fracture: Secondary | ICD-10-CM | POA: Diagnosis not present

## 2023-08-27 DIAGNOSIS — S32591A Other specified fracture of right pubis, initial encounter for closed fracture: Secondary | ICD-10-CM | POA: Diagnosis not present

## 2023-08-31 DIAGNOSIS — D631 Anemia in chronic kidney disease: Secondary | ICD-10-CM | POA: Diagnosis not present

## 2023-08-31 DIAGNOSIS — N1832 Chronic kidney disease, stage 3b: Secondary | ICD-10-CM | POA: Diagnosis not present

## 2023-08-31 DIAGNOSIS — M5135 Other intervertebral disc degeneration, thoracolumbar region: Secondary | ICD-10-CM | POA: Diagnosis not present

## 2023-08-31 DIAGNOSIS — M47817 Spondylosis without myelopathy or radiculopathy, lumbosacral region: Secondary | ICD-10-CM | POA: Diagnosis not present

## 2023-08-31 DIAGNOSIS — Q7649 Other congenital malformations of spine, not associated with scoliosis: Secondary | ICD-10-CM | POA: Diagnosis not present

## 2023-08-31 DIAGNOSIS — H401190 Primary open-angle glaucoma, unspecified eye, stage unspecified: Secondary | ICD-10-CM | POA: Diagnosis not present

## 2023-08-31 DIAGNOSIS — E611 Iron deficiency: Secondary | ICD-10-CM | POA: Diagnosis not present

## 2023-08-31 DIAGNOSIS — K219 Gastro-esophageal reflux disease without esophagitis: Secondary | ICD-10-CM | POA: Diagnosis not present

## 2023-08-31 DIAGNOSIS — M48061 Spinal stenosis, lumbar region without neurogenic claudication: Secondary | ICD-10-CM | POA: Diagnosis not present

## 2023-08-31 DIAGNOSIS — H209 Unspecified iridocyclitis: Secondary | ICD-10-CM | POA: Diagnosis not present

## 2023-08-31 DIAGNOSIS — M5136 Other intervertebral disc degeneration, lumbar region with discogenic back pain only: Secondary | ICD-10-CM | POA: Diagnosis not present

## 2023-08-31 DIAGNOSIS — S32501D Unspecified fracture of right pubis, subsequent encounter for fracture with routine healing: Secondary | ICD-10-CM | POA: Diagnosis not present

## 2023-08-31 DIAGNOSIS — G8929 Other chronic pain: Secondary | ICD-10-CM | POA: Diagnosis not present

## 2023-08-31 DIAGNOSIS — M160 Bilateral primary osteoarthritis of hip: Secondary | ICD-10-CM | POA: Diagnosis not present

## 2023-08-31 DIAGNOSIS — Z9181 History of falling: Secondary | ICD-10-CM | POA: Diagnosis not present

## 2023-08-31 DIAGNOSIS — K279 Peptic ulcer, site unspecified, unspecified as acute or chronic, without hemorrhage or perforation: Secondary | ICD-10-CM | POA: Diagnosis not present

## 2023-08-31 DIAGNOSIS — N319 Neuromuscular dysfunction of bladder, unspecified: Secondary | ICD-10-CM | POA: Diagnosis not present

## 2023-08-31 DIAGNOSIS — G4700 Insomnia, unspecified: Secondary | ICD-10-CM | POA: Diagnosis not present

## 2023-08-31 DIAGNOSIS — Z8673 Personal history of transient ischemic attack (TIA), and cerebral infarction without residual deficits: Secondary | ICD-10-CM | POA: Diagnosis not present

## 2023-08-31 DIAGNOSIS — K449 Diaphragmatic hernia without obstruction or gangrene: Secondary | ICD-10-CM | POA: Diagnosis not present

## 2023-08-31 DIAGNOSIS — S32401D Unspecified fracture of right acetabulum, subsequent encounter for fracture with routine healing: Secondary | ICD-10-CM | POA: Diagnosis not present

## 2023-09-10 DIAGNOSIS — M47817 Spondylosis without myelopathy or radiculopathy, lumbosacral region: Secondary | ICD-10-CM | POA: Diagnosis not present

## 2023-09-10 DIAGNOSIS — S32401D Unspecified fracture of right acetabulum, subsequent encounter for fracture with routine healing: Secondary | ICD-10-CM | POA: Diagnosis not present

## 2023-09-10 DIAGNOSIS — D631 Anemia in chronic kidney disease: Secondary | ICD-10-CM | POA: Diagnosis not present

## 2023-09-10 DIAGNOSIS — S32501D Unspecified fracture of right pubis, subsequent encounter for fracture with routine healing: Secondary | ICD-10-CM | POA: Diagnosis not present

## 2023-09-10 DIAGNOSIS — N319 Neuromuscular dysfunction of bladder, unspecified: Secondary | ICD-10-CM | POA: Diagnosis not present

## 2023-09-10 DIAGNOSIS — M5136 Other intervertebral disc degeneration, lumbar region with discogenic back pain only: Secondary | ICD-10-CM | POA: Diagnosis not present

## 2023-09-10 DIAGNOSIS — N1832 Chronic kidney disease, stage 3b: Secondary | ICD-10-CM | POA: Diagnosis not present

## 2023-09-10 DIAGNOSIS — M160 Bilateral primary osteoarthritis of hip: Secondary | ICD-10-CM | POA: Diagnosis not present

## 2023-09-10 DIAGNOSIS — G8929 Other chronic pain: Secondary | ICD-10-CM | POA: Diagnosis not present

## 2023-09-10 DIAGNOSIS — M5135 Other intervertebral disc degeneration, thoracolumbar region: Secondary | ICD-10-CM | POA: Diagnosis not present

## 2023-09-10 DIAGNOSIS — M48061 Spinal stenosis, lumbar region without neurogenic claudication: Secondary | ICD-10-CM | POA: Diagnosis not present

## 2023-09-10 DIAGNOSIS — K449 Diaphragmatic hernia without obstruction or gangrene: Secondary | ICD-10-CM | POA: Diagnosis not present

## 2023-09-23 ENCOUNTER — Ambulatory Visit: Payer: PPO | Attending: Pain Medicine | Admitting: Pain Medicine

## 2023-09-23 ENCOUNTER — Encounter: Payer: Self-pay | Admitting: Pain Medicine

## 2023-09-23 DIAGNOSIS — G8929 Other chronic pain: Secondary | ICD-10-CM | POA: Insufficient documentation

## 2023-09-23 DIAGNOSIS — G894 Chronic pain syndrome: Secondary | ICD-10-CM

## 2023-09-23 DIAGNOSIS — M4316 Spondylolisthesis, lumbar region: Secondary | ICD-10-CM | POA: Insufficient documentation

## 2023-09-23 DIAGNOSIS — M5416 Radiculopathy, lumbar region: Secondary | ICD-10-CM | POA: Insufficient documentation

## 2023-09-23 DIAGNOSIS — M4155 Other secondary scoliosis, thoracolumbar region: Secondary | ICD-10-CM | POA: Diagnosis not present

## 2023-09-23 DIAGNOSIS — M48062 Spinal stenosis, lumbar region with neurogenic claudication: Secondary | ICD-10-CM

## 2023-09-23 DIAGNOSIS — Z79891 Long term (current) use of opiate analgesic: Secondary | ICD-10-CM | POA: Diagnosis not present

## 2023-09-23 DIAGNOSIS — M5135 Other intervertebral disc degeneration, thoracolumbar region: Secondary | ICD-10-CM

## 2023-09-23 DIAGNOSIS — Q7649 Other congenital malformations of spine, not associated with scoliosis: Secondary | ICD-10-CM | POA: Diagnosis not present

## 2023-09-23 DIAGNOSIS — Z79899 Other long term (current) drug therapy: Secondary | ICD-10-CM

## 2023-09-23 DIAGNOSIS — M4726 Other spondylosis with radiculopathy, lumbar region: Secondary | ICD-10-CM

## 2023-09-23 DIAGNOSIS — M545 Low back pain, unspecified: Secondary | ICD-10-CM

## 2023-09-23 DIAGNOSIS — M79605 Pain in left leg: Secondary | ICD-10-CM

## 2023-09-23 DIAGNOSIS — M25552 Pain in left hip: Secondary | ICD-10-CM | POA: Diagnosis not present

## 2023-09-23 DIAGNOSIS — M47816 Spondylosis without myelopathy or radiculopathy, lumbar region: Secondary | ICD-10-CM | POA: Diagnosis not present

## 2023-09-23 DIAGNOSIS — M25551 Pain in right hip: Secondary | ICD-10-CM | POA: Diagnosis not present

## 2023-09-23 MED ORDER — OXYCODONE HCL 5 MG PO TABS: 5.0000 mg | ORAL_TABLET | Freq: Every day | ORAL | 0 refills | Status: DC | PRN

## 2023-09-23 NOTE — Progress Notes (Unsigned)
PROVIDER NOTE: Information contained herein reflects review and annotations entered in association with encounter. Interpretation of such information and data should be left to medically-trained personnel. Information provided to patient can be located elsewhere in the medical record under "Patient Instructions". Document created using STT-dictation technology, any transcriptional errors that may result from process are unintentional.    Patient: Cynthia Bennett  Service Category: E/M  Provider: Oswaldo Done, MD  DOB: 1933/08/04  DOS: 09/23/2023  Referring Provider: Jerl Mina, MD  MRN: [id number]  Specialty: Interventional Pain Management  PCP: Jerl Mina, MD  Type: Established Patient  Setting: Ambulatory outpatient    Location: Office  Delivery: Face-to-face     HPI  [name], a 89 y.o. year old female, is here today because of her No primary diagnosis found.. Ms. Drill primary complain today is Back Pain (lower)  Pertinent problems: Ms. Breitner has Fibromyalgia; Chronic pain syndrome; Lumbar radicular pain (Bilateral) (R>L); Chronic low back pain (1ry area of Pain) (Bilateral) (L>R) w/o sciatica; Grade 1 spondylolisthesis of L4 over L5; Lumbar discogenic pain syndrome; Lumbar facet syndrome (Bilateral) (L>R); Lumbar facet hypertrophy (Bilateral); Chronic sacroiliac joint pain (Left); Chronic hip pain (Left); Lumbar spondylosis; Abnormal MRI, lumbar spine (06/25/2016); Cervical spondylosis (Anterolisthesis of C5 over C6); Chronic lower extremity pain (2ry area of Pain) (Left); Chronic lumbar radicular pain (Left) (L5 Dermatome); Osteoarthritis of hip (Left); Chronic sacroiliac joint pain (Bilateral) (R>L); Chronic shoulder pain (Left); Osteoarthritis of shoulder (Left); Chronic sacroiliac joint pain (Right); Chronic hip pain (Right); Left upper arm pain; Myofascial pain syndrome (left upper extremity); Acromioclavicular joint pain (Left); Osteoarthritis of AC (acromioclavicular) joint  (Left); Supraspinatus tendon tear, sequela (Left); Spondylosis without myelopathy or radiculopathy, lumbosacral region; Other specified dorsopathies, sacral and sacrococcygeal region; Bertolotti's syndrome (Left); DDD (degenerative disc disease), lumbar; Severe (5-6 mm) L4-5 lumbar spinal stenosis and (8 mm) L3-4 spinal stenosis.; Osteoarthritis of hip (Right); Trigger point with back pain (Right); Chronic myofascial pain; Chronic low back pain (Right) w/o sciatica; Chronic hip pain (3ry area of Pain) (Bilateral) (R>L); Osteoarthritis of hips (Bilateral); Chronic low back pain (Left) w/o sciatica; Thoracic back pain (Left); DDD (degenerative disc disease), thoracic; DDD (degenerative disc disease), thoracolumbar; Scoliosis of thoracolumbar region due to degenerative disease of spine in adult; Weakness of left leg; Numbness and tingling of left leg; Lumbar facet joint pain; and Pseudoarthrosis of lumbar spine on their pertinent problem list. Pain Assessment: Severity of Chronic pain is reported as a 7 /10. Location: Back Lower/denies. Onset: More than a month ago. Quality: Aching, Sore. Timing: Intermittent. Modifying factor(s): sitting, heat. Vitals:  height is 5\' 2"  (1.575 m) and weight is 149 lb (67.6 kg). Her temporal temperature is 96.6 F (35.9 C) (abnormal). Her blood pressure is 142/64 (abnormal) and her pulse is 66. Her respiration is 16 and oxygen saturation is 100%.  BMI: Estimated body mass index is 27.25 kg/m as calculated from the following:   Height as of this encounter: 5\' 2"  (1.575 m).   Weight as of this encounter: 149 lb (67.6 kg). Last encounter: 08/19/2023. Last procedure: 05/26/2023.  Reason for encounter: medication management.  The patient indicates doing well with the current medication regimen. No adverse reactions or side effects reported to the medications.  Since the patient's last visit to our office, she fell and broke her pelvis.  RTCB: 12/22/2023   Pharmacotherapy  Assessment  Analgesic: Oxycodone IR 5 mg, 1 tab PO daily (maximum of 5 mg/day of oxycodone.) MME/day: 7.5 mg/day.  Monitoring: [place] PMP: PDMP reviewed during this encounter.       Pharmacotherapy: No side-effects or adverse reactions reported. Compliance: No problems identified. Effectiveness: Clinically acceptable.  Concepcion Elk, RN  09/23/2023  2:50 PM  Sign when Signing Visit Nursing Pain Medication Assessment:  Safety precautions to be maintained throughout the outpatient stay will include: orient to surroundings, keep bed in low position, maintain call bell within reach at all times, provide assistance with transfer out of bed and ambulation.  Medication Inspection Compliance: Pill count conducted under aseptic conditions, in front of the patient. Neither the pills nor the bottle was removed from the patient's sight at any time. Once count was completed pills were immediately returned to the patient in their original bottle.  Medication: Oxycodone IR Pill/Patch Count:  4 of 30 pills remain Pill/Patch Appearance: Markings consistent with prescribed medication Bottle Appearance: Standard pharmacy container. Clearly labeled. Filled Date: 09 / 14 / 2024 Last Medication intake:  Today    No results found for: "CBDTHCR" No results found for: "D8THCCBX" No results found for: "D9THCCBX"  UDS:  Summary  Date Value Ref Range Status  04/22/2023 Note  Final    Comment:    ==================================================================== [employer] Select 13 (MW) ==================================================================== Specimen Alert Not Detected result may be consistent with the time of last use noted for this medication. AS NEEDED (Oxycodone) ==================================================================== Test                             Result       Flag       Units  Drug Absent but Declared for Prescription Verification   Oxycodone                      Not  Detected UNEXPECTED ng/mg creat ==================================================================== Test                      Result    Flag   Units      Ref Range   Creatinine              65               mg/dL      >=78 ==================================================================== Declared Medications:  The flagging and interpretation on this report are based on the  following declared medications.  Unexpected results may arise from  inaccuracies in the declared medications.   **Note: The testing scope of this panel includes these medications:   Oxycodone   **Note: The testing scope of this panel does not include the  following reported medications:   Acetaminophen (Tylenol)  Doxepin (Sinequan)  Esomeprazole (Nexium)  Eye Drops  Magnesium (Mag-Ox)  Naloxone (Narcan)  Ondansetron (Zofran)  Sertraline (Zoloft)  Trazodone (Desyrel)  Vitamin D3  Zolpidem (Ambien) ==================================================================== For clinical consultation, please call [phone]. ====================================================================       ROS  Constitutional: Denies any fever or chills Gastrointestinal: No reported hemesis, hematochezia, vomiting, or acute GI distress Musculoskeletal: Denies any acute onset joint swelling, redness, loss of ROM, or weakness Neurological: No reported episodes of acute onset apraxia, aphasia, dysarthria, agnosia, amnesia, paralysis, loss of coordination, or loss of consciousness  Medication Review  Guaifenesin, Vitamin D3, acetaminophen, chlorhexidine, cyanocobalamin, diclofenac Sodium, dorzolamide-timolol, doxepin, esomeprazole, magnesium oxide, naloxone, ondansetron, oxyCODONE, sertraline, and zolpidem  History Review  Allergy: Ms. Nygren is allergic to ibuprofen. Drug: Ms. Holzknecht  reports no history of drug use.  Alcohol:  reports no history of alcohol use. Tobacco:  reports that she has quit smoking. She has  never used smokeless tobacco. Social: Ms. Carrio  reports that she has quit smoking. She has never used smokeless tobacco. She reports that she does not drink alcohol and does not use drugs. Medical:  has a past medical history of Dizzy spells, Fibromyalgia, Glaucoma, Hiatal hernia, Osteoarthritis, Osteoarthritis of spine with radiculopathy, lumbar region (08/14/2015), Peptic ulcer disease, Reflux, Spinal stenosis, Stroke (HCC), and TIA (transient ischemic attack). Surgical: Ms. Huther  has a past surgical history that includes Esophagogastroduodenoscopy (N/A, 04/16/2015); Appendectomy; and Abdominal hysterectomy. Family: family history includes COPD in her father; Mental illness in her mother.  Laboratory Chemistry Profile   Renal Lab Results  Component Value Date   BUN 16 06/10/2023   CREATININE 0.95 06/10/2023   BCR 11 (L) 01/10/2019   GFRAA 46 (L) 01/10/2019   GFRNONAA 58 (L) 06/10/2023    Hepatic Lab Results  Component Value Date   AST 17 07/31/2022   ALT 10 07/31/2022   ALBUMIN 3.6 07/31/2022   ALKPHOS 54 07/31/2022   LIPASE 139 04/27/2014    Electrolytes Lab Results  Component Value Date   NA 135 06/10/2023   K 3.8 06/10/2023   CL 105 06/10/2023   CALCIUM 8.8 (L) 06/10/2023   MG 2.3 01/10/2019    Bone Lab Results  Component Value Date   25OHVITD1 26 (L) 01/10/2019   25OHVITD2 1.6 01/10/2019   25OHVITD3 24 01/10/2019    Inflammation (CRP: Acute Phase) (ESR: Chronic Phase) Lab Results  Component Value Date   CRP 0.9 08/24/2020   ESRSEDRATE 51 (H) 01/10/2019         Note: Above Lab results reviewed.  Recent Imaging Review  DG PAIN CLINIC C-ARM 1-60 MIN NO REPORT Fluoro was used, but no Radiologist interpretation will be provided.  Please refer to "NOTES" tab for provider progress note. Note: Reviewed        Physical Exam  General appearance: Well nourished, well developed, and well hydrated. In no apparent acute distress Mental status: Alert, oriented x 3  (person, place, & time)       Respiratory: No evidence of acute respiratory distress Eyes: PERLA Vitals: BP (!) 142/64   Pulse 66   Temp (!) 96.6 F (35.9 C) (Temporal)   Resp 16   Ht 5\' 2"  (1.575 m)   Wt 149 lb (67.6 kg)   SpO2 100%   BMI 27.25 kg/m  BMI: Estimated body mass index is 27.25 kg/m as calculated from the following:   Height as of this encounter: 5\' 2"  (1.575 m).   Weight as of this encounter: 149 lb (67.6 kg). Ideal: Ideal body weight: 50.1 kg (110 lb 7.2 oz) Adjusted ideal body weight: 57.1 kg (125 lb 13.9 oz)  Assessment   Diagnosis Status  1. Chronic low back pain (1ry area of Pain) (Bilateral) (L>R) w/o sciatica   2. Chronic lower extremity pain (2ry area of Pain) (Left)   3. Chronic hip pain (3ry area of Pain) (Bilateral) (R>L)   4. Severe (5-6 mm) L4-5 lumbar spinal stenosis and (8 mm) L3-4 spinal stenosis.   5. Bertolotti's syndrome (Left)   6. Lumbar facet hypertrophy (Bilateral)   7. Grade 1 spondylolisthesis of L4 over L5   8. Chronic lumbar radicular pain (Left) (L5 Dermatome)   9. Scoliosis of thoracolumbar region due to degenerative disease of spine in adult   10. DDD (degenerative disc disease), thoracolumbar  11. Chronic pain syndrome   12. Pharmacologic therapy   13. Chronic use of opiate for therapeutic purpose   14. Encounter for medication management   15. Encounter for chronic pain management    Controlled Controlled Controlled   Updated Problems: No problems updated.  Plan of Care  Problem-specific:  No problem-specific Assessment & Plan notes found for this encounter.  Ms. PRINCELLA CRAIGE has a current medication list which includes the following long-term medication(s): doxepin, oxycodone, [START ON 10/23/2023] oxycodone, [START ON 11/22/2023] oxycodone, and zolpidem.  Pharmacotherapy (Medications Ordered): Meds ordered this encounter  Medications   oxyCODONE (OXY IR/ROXICODONE) 5 MG immediate release tablet    Sig: Take 1 tablet  (5 mg total) by mouth daily as needed for severe pain (pain score 7-10). Must last 30 days    Dispense:  30 tablet    Refill:  0    DO NOT: delete (not duplicate); no partial-fill (will deny script to complete), no refill request (F/U required). DISPENSE: 1 day early if closed on fill date. WARN: No CNS-depressants within 8 hrs of med.   oxyCODONE (OXY IR/ROXICODONE) 5 MG immediate release tablet    Sig: Take 1 tablet (5 mg total) by mouth daily as needed for severe pain (pain score 7-10). Must last 30 days    Dispense:  30 tablet    Refill:  0    DO NOT: delete (not duplicate); no partial-fill (will deny script to complete), no refill request (F/U required). DISPENSE: 1 day early if closed on fill date. WARN: No CNS-depressants within 8 hrs of med.   oxyCODONE (OXY IR/ROXICODONE) 5 MG immediate release tablet    Sig: Take 1 tablet (5 mg total) by mouth daily as needed for severe pain (pain score 7-10). Must last 30 days    Dispense:  30 tablet    Refill:  0    DO NOT: delete (not duplicate); no partial-fill (will deny script to complete), no refill request (F/U required). DISPENSE: 1 day early if closed on fill date. WARN: No CNS-depressants within 8 hrs of med.   Orders:  No orders of the defined types were placed in this encounter.  Follow-up plan:   Return in about 3 months (around 12/22/2023) for Eval-day (M,W), (F2F), (MM).      Interventional Therapies  Risk Factors  Complex Considerations:  Advanced age   Planned  Pending:   Diagnostic/therapeutic left L4-5 and L5-S1 lumbar facet MBB    Under consideration:   Diagnostic/therapeutic left L4-5 and L5-S1 lumbar facet MBB  Possible lumbar facet RFA    Completed:   Diagnostic left L5-S1 pseudoarthrosis inj. #1 (01/09/2022) (10/10 to 0/10)  Therapeutic left L4-5 LESI x8 (06/25/2021) (100/100/80/90)  Therapeutic right L4-5 LESI x1 (07/21/2017) (DNR-F/U)  Palliative right L5-S1 LESI x1 (09/27/2015) (100/100/90)  Therapeutic left  L5-S1 LESI x1 (02/05/2016) (100/100/100/100)  Diagnostic right SI joint injection x2 (08/25/2019) (100/100/40/30)  Diagnostic left SI joint injection x2 (01/27/2019) (100/100/100/100)  Diagnostic left glenohumeral joint inj. x1 (04/29/2017) (100/100/98/98)  Left biceps muscle trigger point inj. x1 (07/21/2017) (DNR-F/U)  Right PSIS trigger point inj. x1 (07/27/2018) (100/100/50/>50)  Diagnostic right lumbar facet MBB x3 (01/09/2022) (10/10 to 0/10)  Diagnostic left lumbar facet MBB x4 (01/09/2022) (10/10 to 0/10)  Diagnostic left IA hip inj. x1 (01/27/2019) (100/100/100/100)    Completed by other providers:   None at this time   Therapeutic  Palliative (PRN) options:   None established      Recent Visits Date Type  Provider Dept  08/19/23 Office Visit Delano Metz, MD [facility]  Showing recent visits within past 90 days and meeting all other requirements Today's Visits Date Type Provider Dept  09/23/23 Office Visit Delano Metz, MD [facility]  Showing today's visits and meeting all other requirements Future Appointments No visits were found meeting these conditions. Showing future appointments within next 90 days and meeting all other requirements  I discussed the assessment and treatment plan with the patient. The patient was provided an opportunity to ask questions and all were answered. The patient agreed with the plan and demonstrated an understanding of the instructions.  Patient advised to call back or seek an in-person evaluation if the symptoms or condition worsens.  Duration of encounter: 30 minutes.  Total time on encounter, as per AMA guidelines included both the face-to-face and non-face-to-face time personally spent by the physician and/or other qualified health care professional(s) on the day of the encounter (includes time in activities that require the physician or other qualified health care professional and does not include time in  activities normally performed by clinical staff). Physician's time may include the following activities when performed: Preparing to see the patient (e.g., pre-charting review of records, searching for previously ordered imaging, lab work, and nerve conduction tests) Review of prior analgesic pharmacotherapies. Reviewing PMP Interpreting ordered tests (e.g., lab work, imaging, nerve conduction tests) Performing post-procedure evaluations, including interpretation of diagnostic procedures Obtaining and/or reviewing separately obtained history Performing a medically appropriate examination and/or evaluation Counseling and educating the patient/family/caregiver Ordering medications, tests, or procedures Referring and communicating with other health care professionals (when not separately reported) Documenting clinical information in the electronic or other health record Independently interpreting results (not separately reported) and communicating results to the patient/ family/caregiver Care coordination (not separately reported)  Note by: Oswaldo Done, MD Date: 09/23/2023; Time: 3:03 PM

## 2023-09-23 NOTE — Progress Notes (Unsigned)
Nursing Pain Medication Assessment:  Safety precautions to be maintained throughout the outpatient stay will include: orient to surroundings, keep bed in low position, maintain call bell within reach at all times, provide assistance with transfer out of bed and ambulation.  Medication Inspection Compliance: Pill count conducted under aseptic conditions, in front of the patient. Neither the pills nor the bottle was removed from the patient's sight at any time. Once count was completed pills were immediately returned to the patient in their original bottle.  Medication: Oxycodone IR Pill/Patch Count:  4 of 30 pills remain Pill/Patch Appearance: Markings consistent with prescribed medication Bottle Appearance: Standard pharmacy container. Clearly labeled. Filled Date: 09 / 14 / 2024 Last Medication intake:  Today

## 2023-09-23 NOTE — Patient Instructions (Signed)

## 2023-09-24 DIAGNOSIS — S32591D Other specified fracture of right pubis, subsequent encounter for fracture with routine healing: Secondary | ICD-10-CM | POA: Diagnosis not present

## 2023-09-24 DIAGNOSIS — S32401A Unspecified fracture of right acetabulum, initial encounter for closed fracture: Secondary | ICD-10-CM | POA: Diagnosis not present

## 2023-09-24 DIAGNOSIS — S32401D Unspecified fracture of right acetabulum, subsequent encounter for fracture with routine healing: Secondary | ICD-10-CM | POA: Diagnosis not present

## 2023-09-29 ENCOUNTER — Ambulatory Visit
Admission: RE | Admit: 2023-09-29 | Discharge: 2023-09-29 | Disposition: A | Discharge status: Home / Self Care | Payer: PPO | Source: Ambulatory Visit | Attending: Pain Medicine | Admitting: Pain Medicine

## 2023-09-29 ENCOUNTER — Encounter: Payer: Self-pay | Admitting: Pain Medicine

## 2023-09-29 ENCOUNTER — Ambulatory Visit: Payer: PPO | Attending: Pain Medicine | Admitting: Pain Medicine

## 2023-09-29 VITALS — BP 111/66 | HR 67 | Temp 97.3°F | Resp 17 | Ht 62.0 in | Wt 145.0 lb

## 2023-09-29 DIAGNOSIS — M4316 Spondylolisthesis, lumbar region: Secondary | ICD-10-CM | POA: Insufficient documentation

## 2023-09-29 DIAGNOSIS — M5459 Other low back pain: Secondary | ICD-10-CM | POA: Diagnosis not present

## 2023-09-29 DIAGNOSIS — M545 Low back pain, unspecified: Secondary | ICD-10-CM | POA: Insufficient documentation

## 2023-09-29 DIAGNOSIS — M47816 Spondylosis without myelopathy or radiculopathy, lumbar region: Secondary | ICD-10-CM | POA: Insufficient documentation

## 2023-09-29 DIAGNOSIS — M47817 Spondylosis without myelopathy or radiculopathy, lumbosacral region: Secondary | ICD-10-CM | POA: Insufficient documentation

## 2023-09-29 DIAGNOSIS — G8929 Other chronic pain: Secondary | ICD-10-CM | POA: Insufficient documentation

## 2023-09-29 MED ORDER — LIDOCAINE HCL 2 % IJ SOLN
INTRAMUSCULAR | Status: AC
  Filled 2023-09-29: qty 20

## 2023-09-29 MED ORDER — LIDOCAINE HCL 2 % IJ SOLN
20.0000 mL | Freq: Once | INTRAMUSCULAR | Status: AC
  Administered 2023-09-29: 400 mg

## 2023-09-29 MED ORDER — TRIAMCINOLONE ACETONIDE 40 MG/ML IJ SUSP
40.0000 mg | Freq: Once | INTRAMUSCULAR | Status: AC
  Administered 2023-09-29: 40 mg

## 2023-09-29 MED ORDER — PENTAFLUOROPROP-TETRAFLUOROETH EX AERO
INHALATION_SPRAY | Freq: Once | CUTANEOUS | Status: AC
  Administered 2023-09-29: 30 via TOPICAL

## 2023-09-29 MED ORDER — ROPIVACAINE HCL 2 MG/ML IJ SOLN
9.0000 mL | Freq: Once | INTRAMUSCULAR | Status: AC
  Administered 2023-09-29: 9 mL via PERINEURAL

## 2023-09-29 MED ORDER — TRIAMCINOLONE ACETONIDE 40 MG/ML IJ SUSP
INTRAMUSCULAR | Status: AC
  Filled 2023-09-29: qty 1

## 2023-09-29 MED ORDER — ROPIVACAINE HCL 2 MG/ML IJ SOLN
INTRAMUSCULAR | Status: AC
  Filled 2023-09-29: qty 20

## 2023-09-29 NOTE — Patient Instructions (Addendum)
Facet Joint Block The facet joints connect the bones of the spine (vertebrae). They let you bend, twist, and make other movements with your spine. They also keep you from bending too far, twisting too far, and making other extreme movements. A facet joint block is a procedure where a numbing medicine (local anesthesia) is injected into a facet joint. Many times, a medicine for inflammation (steroid) is also injected. A facet joint block may be done: To diagnose neck or back pain. If the pain gets better after a facet joint block, the pain is likely coming from the facet joint. If the pain does not get better, the pain is likely not coming from the facet joint. To treat neck or back pain caused by an inflamed facet joint. To help you with physical therapy or other rehab (rehabilitation) exercises. Tell a health care provider about: Any allergies you have. All medicines you are taking, including vitamins, herbs, eye drops, creams, and over-the-counter medicines. Any problems you or family members have had with anesthesia. Any bleeding problems you have. Any surgeries you have had. Any medical conditions you have or have had. Whether you are pregnant or may be pregnant. What are the risks? Your health care provider will talk with you about risks. These may include: Infection. Allergic reactions to medicines or dyes. Bleeding. Injury to a nerve near where the needle was put in (injection site). Pain at the injection site. Short-term weakness or numbness in areas near the nerves at the injection site. What happens before the procedure? When to stop eating and drinking Follow instructions from your health care provider about what you may eat and drink. Medicines Ask your health care provider about: Changing or stopping your regular medicines. These include any diabetes medicines or blood thinners you take. Taking medicines such as aspirin and ibuprofen. These medicines can thin your blood. Do  not take these medicines unless your health care provider tells you to. Taking over-the-counter medicines, vitamins, herbs, and supplements. General instructions If you will be going home right after the procedure, plan to have a responsible adult: Take you home from the hospital or clinic. You will not be allowed to drive. Care for you for the time you are told. Ask your health care provider: How your injection site will be marked. What steps will be taken to help prevent infection. These may include washing skin with a soap that kills germs. What happens during the procedure?  An IV will be inserted into one of your veins. You will lie on your stomach on an X-ray table. You may be asked to lie in a different position if you will be getting an injection in your neck. Your injection site will be cleaned with a soap that kills germs and then covered with a germ-free (sterile) drape. A local anesthesia will be put in at the injection site. A type of X-ray machine (fluoroscopy) or CT scan will be used to help find your facet joint. A contrast dye may also be injected into your joint to help show if the needle is at the joint. When your provider knows the needle is at your joint, they will inject anesthesia and anti-inflammatory medicine as needed. The needle will be removed. Pressure will be applied to keep your injection site from bleeding. A bandage (dressing) will be placed over each injection site. The procedure may vary among health care providers and hospitals. What happens after the procedure? Your blood pressure, heart rate, breathing rate, and blood oxygen level  will be monitored until you leave the hospital or clinic. This information is not intended to replace advice given to you by your health care provider. Make sure you discuss any questions you have with your health care provider. Document Revised: 05/09/2022 Document Reviewed: 05/09/2022 [employer] Patient Education  2024  [employer]  ______________________________________________________________________    Post-Procedure Discharge Instructions  Instructions: Apply ice:  Purpose: This will minimize any swelling and discomfort after procedure.  When: Day of procedure, as soon as you get home. How: Fill a plastic sandwich bag with crushed ice. Cover it with a small towel and apply to injection site. How long: (15 min on, 15 min off) Apply for 15 minutes then remove x 15 minutes.  Repeat sequence on day of procedure, until you go to bed. Apply heat:  Purpose: To treat any soreness and discomfort from the procedure. When: Starting the next day after the procedure. How: Apply heat to procedure site starting the day following the procedure. How long: May continue to repeat daily, until discomfort goes away. Food intake: Start with clear liquids (like water) and advance to regular food, as tolerated.  Physical activities: Keep activities to a minimum for the first 8 hours after the procedure. After that, then as tolerated. Driving: If you have received any sedation, be responsible and do not drive. You are not allowed to drive for 24 hours after having sedation. Blood thinner: (Applies only to those taking blood thinners) You may restart your blood thinner 6 hours after your procedure. Insulin: (Applies only to Diabetic patients taking insulin) As soon as you can eat, you may resume your normal dosing schedule. Infection prevention: Keep procedure site clean and dry. Shower daily and clean area with soap and water. Post-procedure Pain Diary: Extremely important that this be done correctly and accurately. Recorded information will be used to determine the next step in treatment. For the purpose of accuracy, follow these rules: Evaluate only the area treated. Do not report or include pain from an untreated area. For the purpose of this evaluation, ignore all other areas of pain, except for the treated area. After  your procedure, avoid taking a long nap and attempting to complete the pain diary after you wake up. Instead, set your alarm clock to go off every hour, on the hour, for the initial 8 hours after the procedure. Document the duration of the numbing medicine, and the relief you are getting from it. Do not go to sleep and attempt to complete it later. It will not be accurate. If you received sedation, it is likely that you were given a medication that may cause amnesia. Because of this, completing the diary at a later time may cause the information to be inaccurate. This information is needed to plan your care. Follow-up appointment: Keep your post-procedure follow-up evaluation appointment after the procedure (usually 2 weeks for most procedures, 6 weeks for radiofrequencies). DO NOT FORGET to bring you pain diary with you.   Expect: (What should I expect to see with my procedure?) From numbing medicine (AKA: Local Anesthetics): Numbness or decrease in pain. You may also experience some weakness, which if present, could last for the duration of the local anesthetic. Onset: Full effect within 15 minutes of injected. Duration: It will depend on the type of local anesthetic used. On the average, 1 to 8 hours.  From steroids (Applies only if steroids were used): Decrease in swelling or inflammation. Once inflammation is improved, relief of the pain will follow. Onset  of benefits: Depends on the amount of swelling present. The more swelling, the longer it will take for the benefits to be seen. In some cases, up to 10 days. Duration: Steroids will stay in the system x 2 weeks. Duration of benefits will depend on multiple posibilities including persistent irritating factors. Side-effects: If present, they may typically last 2 weeks (the duration of the steroids). Frequent: Cramps (if they occur, drink Gatorade and take over-the-counter Magnesium 450-500 mg once to twice a day); water retention with temporary  weight gain; increases in blood sugar; decreased immune system response; increased appetite. Occasional: Facial flushing (red, warm cheeks); mood swings; menstrual changes. Uncommon: Long-term decrease or suppression of natural hormones; bone thinning. (These are more common with higher doses or more frequent use. This is why we prefer that our patients avoid having any injection therapies in other practices.)  Very Rare: Severe mood changes; psychosis; aseptic necrosis. From procedure: Some discomfort is to be expected once the numbing medicine wears off. This should be minimal if ice and heat are applied as instructed.  Call if: (When should I call?) You experience numbness and weakness that gets worse with time, as opposed to wearing off. New onset bowel or bladder incontinence. (Applies only to procedures done in the spine)  Emergency Numbers: Durning business hours (Monday - Thursday, 8:00 AM - 4:00 PM) (Friday, 9:00 AM - 12:00 Noon): [phone] After hours: [phone] NOTE: If you are having a problem and are unable connect with, or to talk to a provider, then go to your nearest urgent care or emergency department. If the problem is serious and urgent, please call 911. ______________________________________________________________________

## 2023-09-29 NOTE — Progress Notes (Signed)
PROVIDER NOTE: Interpretation of information contained herein should be left to medically-trained personnel. Specific patient instructions are provided elsewhere under "Patient Instructions" section of medical record. This document was created in part using STT-dictation technology, any transcriptional errors that may result from this process are unintentional.  Patient: Cynthia Bennett Type: Established DOB: [birth year] MRN: [id number] PCP: Jerl Mina, MD  Service: Procedure DOS: 09/29/2023 Setting: Ambulatory Location: Ambulatory outpatient facility Delivery: Face-to-face Provider: Oswaldo Done, MD Specialty: Interventional Pain Management Specialty designation: 09 Location: Outpatient facility Ref. Prov.: Jerl Mina, MD       Interventional Therapy   Type: Lumbar Facet, Medial Branch Block(s) R4L5           Laterality: Left  Level: L3, L4, L5, and S1 Medial Branch Level(s). Injecting these levels blocks the L4-5 and L5-S1 lumbar facet joints. Imaging: Fluoroscopic guidance Spinal ([id number]) Anesthesia: Local anesthesia (1-2% Lidocaine) Anxiolysis: None                 Sedation: No Sedation                       DOS: 09/29/2023 Performed by: Oswaldo Done, MD  Primary Purpose: Diagnostic/Therapeutic Indications: Low back pain severe enough to impact quality of life or function. 1. Chronic low back pain (Left) w/o sciatica   2. Lumbar facet joint pain   3. Lumbar facet hypertrophy (Bilateral)   4. Grade 1 spondylolisthesis of L4 over L5   5. Lumbar facet syndrome (Bilateral) (L>R)   6. Spondylosis without myelopathy or radiculopathy, lumbosacral region    NAS-11 Pain score:   Pre-procedure: 4 /10   Post-procedure: 4 /10     Position / Prep / Materials:  Position: Prone  Prep solution: ChloraPrep (2% chlorhexidine gluconate and 70% isopropyl alcohol) Area Prepped: Posterolateral Lumbosacral Spine (Wide prep: From the lower border of the scapula down to the  end of the tailbone and from flank to flank.)  Materials:  Tray: Block Needle(s):  Type: Spinal  Gauge (G): 22  Length: 5-in Qty: 4      H&P (Pre-op Assessment):  Cynthia Bennett is a 89 y.o. (year old), female patient, seen today for interventional treatment. She  has a past surgical history that includes Esophagogastroduodenoscopy (N/A, 04/16/2015); Appendectomy; and Abdominal hysterectomy. Cynthia Bennett has a current medication list which includes the following prescription(s): acetaminophen, chlorhexidine, vitamin d3, cyanocobalamin, diclofenac sodium, dorzolamide-timolol, doxepin, esomeprazole, guaifenesin, magnesium oxide, naloxone, ondansetron, oxycodone, [START ON 10/23/2023] oxycodone, [START ON 11/22/2023] oxycodone, sertraline, and zolpidem. Her primarily concern today is the Back Pain (Left, lower)  Initial Vital Signs:  Pulse/HCG Rate: 67ECG Heart Rate: 65 Temp: (!) 97.3 F (36.3 C) Resp: 16 BP: (!) 97/58 SpO2: 97 %  BMI: Estimated body mass index is 26.52 kg/m as calculated from the following:   Height as of this encounter: 5\' 2"  (1.575 m).   Weight as of this encounter: 145 lb (65.8 kg).  Risk Assessment: Allergies: Reviewed. She is allergic to ibuprofen.  Allergy Precautions: None required Coagulopathies: Reviewed. None identified.  Blood-thinner therapy: None at this time Active Infection(s): Reviewed. None identified. Cynthia Bennett is afebrile  Site Confirmation: Cynthia Bennett was asked to confirm the procedure and laterality before marking the site Procedure checklist: Completed Consent: Before the procedure and under the influence of no sedative(s), amnesic(s), or anxiolytics, the patient was informed of the treatment options, risks and possible complications. To fulfill our ethical and legal obligations, as recommended by the American Medical  Association's Code of Ethics, I have informed the patient of my clinical impression; the nature and purpose of the treatment or procedure; the  risks, benefits, and possible complications of the intervention; the alternatives, including doing nothing; the risk(s) and benefit(s) of the alternative treatment(s) or procedure(s); and the risk(s) and benefit(s) of doing nothing. The patient was provided information about the general risks and possible complications associated with the procedure. These may include, but are not limited to: failure to achieve desired goals, infection, bleeding, organ or nerve damage, allergic reactions, paralysis, and death. In addition, the patient was informed of those risks and complications associated to Spine-related procedures, such as failure to decrease pain; infection (i.e.: Meningitis, epidural or intraspinal abscess); bleeding (i.e.: epidural hematoma, subarachnoid hemorrhage, or any other type of intraspinal or peri-dural bleeding); organ or nerve damage (i.e.: Any type of peripheral nerve, nerve root, or spinal cord injury) with subsequent damage to sensory, motor, and/or autonomic systems, resulting in permanent pain, numbness, and/or weakness of one or several areas of the body; allergic reactions; (i.e.: anaphylactic reaction); and/or death. Furthermore, the patient was informed of those risks and complications associated with the medications. These include, but are not limited to: allergic reactions (i.e.: anaphylactic or anaphylactoid reaction(s)); adrenal axis suppression; blood sugar elevation that in diabetics may result in ketoacidosis or comma; water retention that in patients with history of congestive heart failure may result in shortness of breath, pulmonary edema, and decompensation with resultant heart failure; weight gain; swelling or edema; medication-induced neural toxicity; particulate matter embolism and blood vessel occlusion with resultant organ, and/or nervous system infarction; and/or aseptic necrosis of one or more joints. Finally, the patient was informed that Medicine is not an exact  science; therefore, there is also the possibility of unforeseen or unpredictable risks and/or possible complications that may result in a catastrophic outcome. The patient indicated having understood very clearly. We have given the patient no guarantees and we have made no promises. Enough time was given to the patient to ask questions, all of which were answered to the patient's satisfaction. Ms. Selles has indicated that she wanted to continue with the procedure. Attestation: I, the ordering provider, attest that I have discussed with the patient the benefits, risks, side-effects, alternatives, likelihood of achieving goals, and potential problems during recovery for the procedure that I have provided informed consent. Date  Time: 09/29/2023 11:17 AM   Pre-Procedure Preparation:  Monitoring: As per clinic protocol. Respiration, ETCO2, SpO2, BP, heart rate and rhythm monitor placed and checked for adequate function Safety Precautions: Patient was assessed for positional comfort and pressure points before starting the procedure. Time-out: I initiated and conducted the "Time-out" before starting the procedure, as per protocol. The patient was asked to participate by confirming the accuracy of the "Time Out" information. Verification of the correct person, site, and procedure were performed and confirmed by me, the nursing staff, and the patient. "Time-out" conducted as per Joint Commission's Universal Protocol (UP.01.01.01). Time: 1131 Start Time: 1131 hrs.  Description of Procedure:          Laterality: (see above) Targeted Levels: (see above)  Safety Precautions: Aspiration looking for blood return was conducted prior to all injections. At no point did we inject any substances, as a needle was being advanced. Before injecting, the patient was told to immediately notify me if she was experiencing any new onset of "ringing in the ears, or metallic taste in the mouth". No attempts were made at seeking  any paresthesias. Safe  injection practices and needle disposal techniques used. Medications properly checked for expiration dates. SDV (single dose vial) medications used. After the completion of the procedure, all disposable equipment used was discarded in the proper designated medical waste containers. Local Anesthesia: Protocol guidelines were followed. The patient was positioned over the fluoroscopy table. The area was prepped in the usual manner. The time-out was completed. The target area was identified using fluoroscopy. A 12-in long, straight, sterile hemostat was used with fluoroscopic guidance to locate the targets for each level blocked. Once located, the skin was marked with an approved surgical skin marker. Once all sites were marked, the skin (epidermis, dermis, and hypodermis), as well as deeper tissues (fat, connective tissue and muscle) were infiltrated with a small amount of a short-acting local anesthetic, loaded on a 10cc syringe with a 25G, 1.5-in  Needle. An appropriate amount of time was allowed for local anesthetics to take effect before proceeding to the next step. Local Anesthetic: Lidocaine 2.0% The unused portion of the local anesthetic was discarded in the proper designated containers. Technical description of process:   L3 Medial Branch Nerve Block (MBB): The target area for the L3 medial branch is at the junction of the postero-lateral aspect of the superior articular process and the superior, posterior, and medial edge of the transverse process of L4. Under fluoroscopic guidance, a Quincke needle was inserted until contact was made with os over the superior postero-lateral aspect of the pedicular shadow (target area). After negative aspiration for blood, 0.5 mL of the nerve block solution was injected without difficulty or complication. The needle was removed intact. L4 Medial Branch Nerve Block (MBB): The target area for the L4 medial branch is at the junction of the  postero-lateral aspect of the superior articular process and the superior, posterior, and medial edge of the transverse process of L5. Under fluoroscopic guidance, a Quincke needle was inserted until contact was made with os over the superior postero-lateral aspect of the pedicular shadow (target area). After negative aspiration for blood, 0.5 mL of the nerve block solution was injected without difficulty or complication. The needle was removed intact. L5 Medial Branch Nerve Block (MBB): The target area for the L5 medial branch is at the junction of the postero-lateral aspect of the superior articular process and the superior, posterior, and medial edge of the sacral ala. Under fluoroscopic guidance, a Quincke needle was inserted until contact was made with os over the superior postero-lateral aspect of the pedicular shadow (target area). After negative aspiration for blood, 0.5 mL of the nerve block solution was injected without difficulty or complication. The needle was removed intact. S1 Medial Branch Nerve Block (MBB): The target area for the S1 medial branch is at the posterior and inferior 6 o'clock position of the L5-S1 facet joint. Under fluoroscopic guidance, the Quincke needle inserted for the L5 MBB was redirected until contact was made with os over the inferior and postero aspect of the sacrum, at the 6 o' clock position under the L5-S1 facet joint (Target area). After negative aspiration for blood, 0.5 mL of the nerve block solution was injected without difficulty or complication. The needle was removed intact.  Once the entire procedure was completed, the treated area was cleaned, making sure to leave some of the prepping solution back to take advantage of its long term bactericidal properties.         Illustration of the posterior view of the lumbar spine and the posterior neural structures. Laminae of L2 through  S1 are labeled. DPRL5, dorsal primary ramus of L5; DPRS1, dorsal primary ramus  of S1; DPR3, dorsal primary ramus of L3; FJ, facet (zygapophyseal) joint L3-L4; I, inferior articular process of L4; LB1, lateral branch of dorsal primary ramus of L1; IAB, inferior articular branches from L3 medial branch (supplies L4-L5 facet joint); IBP, intermediate branch plexus; MB3, medial branch of dorsal primary ramus of L3; NR3, third lumbar nerve root; S, superior articular process of L5; SAB, superior articular branches from L4 (supplies L4-5 facet joint also); TP3, transverse process of L3.   Facet Joint Innervation (* possible contribution)  L1-2 T12, L1 (L2*)  Medial Branch  L2-3 L1, L2 (L3*)         "          "  L3-4 L2, L3 (L4*)         "          "  L4-5 L3, L4 (L5*)         "          "  L5-S1 L4, L5, S1          "          "    Vitals:   09/29/23 1117 09/29/23 1130 09/29/23 1135  BP: (!) 97/58 139/65 111/66  Pulse: 67    Resp: 16 17 17   Temp: (!) 97.3 F (36.3 C)    TempSrc: Temporal    SpO2: 97% 95% 96%  Weight: 145 lb (65.8 kg)    Height: 5\' 2"  (1.575 m)       End Time: 1135 hrs.  Imaging Guidance (Spinal):          Type of Imaging Technique: Fluoroscopy Guidance (Spinal) Indication(s): Fluoroscopy guidance for needle placement to enhance accuracy in procedures requiring precise needle localization for targeted delivery of medication in or near specific anatomical locations not easily accessible without such real-time imaging assistance. Exposure Time: Please see nurses notes. Contrast: None used. Fluoroscopic Guidance: I was personally present during the use of fluoroscopy. "Tunnel Vision Technique" used to obtain the best possible view of the target area. Parallax error corrected before commencing the procedure. "Direction-depth-direction" technique used to introduce the needle under continuous pulsed fluoroscopy. Once target was reached, antero-posterior, oblique, and lateral fluoroscopic projection used confirm needle placement in all planes. Images  permanently stored in EMR. Interpretation: No contrast injected. I personally interpreted the imaging intraoperatively. Adequate needle placement confirmed in multiple planes. Permanent images saved into the patient's record.  Post-operative Assessment:  Post-procedure Vital Signs:  Pulse/HCG Rate: 6760 Temp: (!) 97.3 F (36.3 C) Resp: 17 BP: 111/66 SpO2: 96 %  EBL: None  Complications: No immediate post-treatment complications observed by team, or reported by patient.  Note: The patient tolerated the entire procedure well. A repeat set of vitals were taken after the procedure and the patient was kept under observation following institutional policy, for this type of procedure. Post-procedural neurological assessment was performed, showing return to baseline, prior to discharge. The patient was provided with post-procedure discharge instructions, including a section on how to identify potential problems. Should any problems arise concerning this procedure, the patient was given instructions to immediately contact us, at any time, without hesitation. In any case, we plan to contact the patient by telephone for a follow-up status report regarding this interventional procedure.  Comments:  No additional relevant information.  Plan of Care (POC)  Orders:  Orders Placed This Encounter  Procedures   LUMBAR FACET(MEDIAL  BRANCH NERVE BLOCK) MBNB    Scheduling Instructions:     Procedure: Lumbar facet block (AKA.: Lumbosacral medial branch nerve block)     Side: Left-sided     Level: L4-5 and L5-S1 Facets (L3, L4, L5, and S1 Medial Branch Nerves)     Sedation: Patient's choice.     Timeframe: Today    Order Specific Question:   Where will this procedure be performed?    Answer:   [facility]   DG [facility] C-ARM 1-60 MIN NO REPORT    Intraoperative interpretation by procedural physician at [facility].    Standing Status:   Standing    Number of Occurrences:   1     Order Specific Question:   Reason for exam:    Answer:   Assistance in needle guidance and placement for procedures requiring needle placement in or near specific anatomical locations not easily accessible without such assistance.   Informed Consent Details: Physician/Practitioner Attestation; Transcribe to consent form and obtain patient signature    Nursing Order: Transcribe to consent form and obtain patient signature. Note: Always confirm laterality of pain with Ms. Bondar, before procedure.    Order Specific Question:   Physician/Practitioner attestation of informed consent for procedure/surgical case    Answer:   I, the physician/practitioner, attest that I have discussed with the patient the benefits, risks, side effects, alternatives, likelihood of achieving goals and potential problems during recovery for the procedure that I have provided informed consent.    Order Specific Question:   Procedure    Answer:   Lumbar Facet Block  under fluoroscopic guidance    Order Specific Question:   Physician/Practitioner performing the procedure    Answer:   Anjalina Bergevin A. Laban Emperor MD    Order Specific Question:   Indication/Reason    Answer:   Low Back Pain, with our without leg pain, due to Facet Joint Arthralgia (Joint Pain) Spondylosis (Arthritis of the Spine), without myelopathy or radiculopathy (Nerve Damage).   Provide equipment / supplies at bedside    Procedure tray: "Block Tray" (Disposable  single use) Skin infiltration needle: Regular 1.5-in, 25-G, (x1) Block Needle type: Spinal Amount/quantity: 4 Size: Medium (5-inch) Gauge: 22G    Standing Status:   Standing    Number of Occurrences:   1    Order Specific Question:   Specify    Answer:   Block Tray   Chronic Opioid Analgesic:  Oxycodone IR 5 mg, 1 tab PO daily (maximum of 5 mg/day of oxycodone.) MME/day: 7.5 mg/day.   Medications ordered for procedure: Meds ordered this encounter  Medications   lidocaine (XYLOCAINE) 2 % (with  pres) injection 400 mg   pentafluoroprop-tetrafluoroeth (GEBAUERS) aerosol   ropivacaine (PF) 2 mg/mL (0.2%) (NAROPIN) injection 9 mL   triamcinolone acetonide (KENALOG-40) injection 40 mg   Medications administered: We administered lidocaine, pentafluoroprop-tetrafluoroeth, ropivacaine (PF) 2 mg/mL (0.2%), and triamcinolone acetonide.  See the medical record for exact dosing, route, and time of administration.  Follow-up plan:   Return in about 2 weeks (around 10/13/2023) for (Face2F), (PPE).       Interventional Therapies  Risk Factors  Complex Considerations:  Advanced age   Planned  Pending:   Diagnostic/therapeutic left L4-5 and L5-S1 lumbar facet MBB R4L5    Under consideration:   Diagnostic/therapeutic left L4-5 and L5-S1 lumbar facet MBB  Possible lumbar facet RFA    Completed:   Diagnostic left L5-S1 pseudoarthrosis inj. #1 (01/09/2022) (10/10 to 0/10)  Therapeutic  left L4-5 LESI x8 (06/25/2021) (100/100/80/90)  Therapeutic right L4-5 LESI x1 (07/21/2017) (DNR-F/U)  Palliative right L5-S1 LESI x1 (09/27/2015) (100/100/90)  Therapeutic left L5-S1 LESI x1 (02/05/2016) (100/100/100/100)  Diagnostic right SI joint injection x2 (08/25/2019) (100/100/40/30)  Diagnostic left SI joint injection x2 (01/27/2019) (100/100/100/100)  Diagnostic left glenohumeral joint inj. x1 (04/29/2017) (100/100/98/98)  Left biceps muscle trigger point inj. x1 (07/21/2017) (DNR-F/U)  Right PSIS trigger point inj. x1 (07/27/2018) (100/100/50/>50)  Diagnostic right lumbar facet MBB x3 (01/09/2022) (10/10 to 0/10)  Diagnostic left lumbar facet MBB x4 (01/09/2022) (10/10 to 0/10)  Diagnostic left IA hip inj. x1 (01/27/2019) (100/100/100/100)    Completed by other providers:   None at this time   Therapeutic  Palliative (PRN) options:   None established      Recent Visits Date Type Provider Dept  09/23/23 Office Visit Delano Metz, MD [facility]  08/19/23 Office Visit Delano Metz, MD [facility]  Showing recent visits within past 90 days and meeting all other requirements Today's Visits Date Type Provider Dept  09/29/23 Procedure visit Delano Metz, MD [facility]  Showing today's visits and meeting all other requirements Future Appointments Date Type Provider Dept  10/12/23 Appointment Delano Metz, MD [facility]  12/21/23 Appointment Delano Metz, MD [facility]  Showing future appointments within next 90 days and meeting all other requirements  Disposition: Discharge home  Discharge (Date  Time): 09/29/2023; 1145 hrs.   Primary Care Physician: Jerl Mina, MD Location: [facility] Note by: Oswaldo Done, MD (TTS technology used. I apologize for any typographical errors that were not detected and corrected.) Date: 09/29/2023; Time: 12:07 PM  Disclaimer:  Medicine is not an Visual merchandiser. The only guarantee in medicine is that nothing is guaranteed. It is important to note that the decision to proceed with this intervention was based on the information collected from the patient. The Data and conclusions were drawn from the patient's questionnaire, the interview, and the physical examination. Because the information was provided in large part by the patient, it cannot be guaranteed that it has not been purposely or unconsciously manipulated. Every effort has been made to obtain as much relevant data as possible for this evaluation. It is important to note that the conclusions that lead to this procedure are derived in large part from the available data. Always take into account that the treatment will also be dependent on availability of resources and existing treatment guidelines, considered by other Pain Management Practitioners as being common knowledge and practice, at the time of the intervention. For Medico-Legal purposes, it is also important to point out  that variation in procedural techniques and pharmacological choices are the acceptable norm. The indications, contraindications, technique, and results of the above procedure should only be interpreted and judged by a Board-Certified Interventional Pain Specialist with extensive familiarity and expertise in the same exact procedure and technique.

## 2023-09-30 ENCOUNTER — Telehealth: Payer: Self-pay

## 2023-09-30 DIAGNOSIS — M797 Fibromyalgia: Secondary | ICD-10-CM | POA: Diagnosis not present

## 2023-09-30 DIAGNOSIS — G4700 Insomnia, unspecified: Secondary | ICD-10-CM | POA: Diagnosis not present

## 2023-09-30 DIAGNOSIS — S72001D Fracture of unspecified part of neck of right femur, subsequent encounter for closed fracture with routine healing: Secondary | ICD-10-CM | POA: Diagnosis not present

## 2023-09-30 DIAGNOSIS — D649 Anemia, unspecified: Secondary | ICD-10-CM | POA: Diagnosis not present

## 2023-09-30 NOTE — Telephone Encounter (Signed)
Post procedure follow up.  Call was being screened and the number would not accept the call.

## 2023-10-12 ENCOUNTER — Ambulatory Visit: Payer: PPO | Attending: Pain Medicine | Admitting: Pain Medicine

## 2023-10-12 DIAGNOSIS — M25552 Pain in left hip: Secondary | ICD-10-CM

## 2023-10-12 DIAGNOSIS — M5459 Other low back pain: Secondary | ICD-10-CM

## 2023-10-12 DIAGNOSIS — M545 Low back pain, unspecified: Secondary | ICD-10-CM

## 2023-10-12 DIAGNOSIS — M47816 Spondylosis without myelopathy or radiculopathy, lumbar region: Secondary | ICD-10-CM

## 2023-10-12 DIAGNOSIS — G8929 Other chronic pain: Secondary | ICD-10-CM

## 2023-10-12 DIAGNOSIS — Z09 Encounter for follow-up examination after completed treatment for conditions other than malignant neoplasm: Secondary | ICD-10-CM

## 2023-10-12 NOTE — Progress Notes (Signed)
Attempted to call patient phone went directly to unable to take call.

## 2023-10-12 NOTE — Progress Notes (Signed)
Patient: Cynthia Bennett  Service Category: E/M  Provider: Oswaldo Done, MD  DOB: [birth year]  DOS: 10/12/2023  Location: Office  MRN: [id number]  Setting: Ambulatory outpatient  Referring Provider: Jerl Mina, MD  Type: Established Patient  Specialty: Interventional Pain Management  PCP: Jerl Mina, MD  Location: Remote location  Delivery: TeleHealth     Virtual Encounter - Pain Management PROVIDER NOTE: Information contained herein reflects review and annotations entered in association with encounter. Interpretation of such information and data should be left to medically-trained personnel. Information provided to patient can be located elsewhere in the medical record under "Patient Instructions". Document created using STT-dictation technology, any transcriptional errors that may result from process are unintentional.    Contact & Pharmacy Preferred: [phone] Home: [phone] (home) Mobile: [phone] (mobile) E-mail: [name]  [facility] - [place], [place] - [address] [place] [address] Phone: [phone] Fax: [phone]   Pre-screening  Cynthia Bennett offered "in-person" vs "virtual" encounter. She indicated preferring virtual for this encounter.   Reason COVID-19*  Social distancing based on CDC and AMA recommendations.   I contacted Cynthia Bennett on 10/12/2023 via telephone.      I clearly identified myself as Oswaldo Done, MD. I verified that I was speaking with the correct person using two identifiers (Name: Cynthia Bennett, and date of birth: Sep 19, 1933).  Consent I sought verbal advanced consent from Cynthia Bennett for virtual visit interactions. I informed Cynthia Bennett of possible security and privacy concerns, risks, and limitations associated with providing "not-in-person" medical evaluation and management services. I also informed Cynthia Bennett of the availability of "in-person" appointments. Finally, I informed her that there would be a  charge for the virtual visit and that she could be  personally, fully or partially, financially responsible for it. Ms. Fadley expressed understanding and agreed to proceed.   Historic Elements   Cynthia Bennett is a 89 y.o. year old, female patient evaluated today after our last contact on 09/29/2023. Cynthia Bennett  has a past medical history of Dizzy spells, Fibromyalgia, Glaucoma, Hiatal hernia, Osteoarthritis, Osteoarthritis of spine with radiculopathy, lumbar region (08/14/2015), Peptic ulcer disease, Reflux, Spinal stenosis, Stroke (HCC), and TIA (transient ischemic attack). She also  has a past surgical history that includes Esophagogastroduodenoscopy (N/A, 04/16/2015); Appendectomy; and Abdominal hysterectomy. Cynthia Bennett has a current medication list which includes the following prescription(s): acetaminophen, chlorhexidine, vitamin d3, cyanocobalamin, diclofenac sodium, dorzolamide-timolol, doxepin, esomeprazole, guaifenesin, magnesium oxide, naloxone, ondansetron, oxycodone, [START ON 10/23/2023] oxycodone, [START ON 11/22/2023] oxycodone, sertraline, and zolpidem. She  reports that she has quit smoking. She has never used smokeless tobacco. She reports that she does not drink alcohol and does not use drugs. Cynthia Bennett is allergic to ibuprofen.  BMI: Estimated body mass index is 26.52 kg/m as calculated from the following:   Height as of 09/29/23: 5\' 2"  (1.575 m).   Weight as of 09/29/23: 145 lb (65.8 kg). Last encounter: 09/23/2023. Last procedure: 09/29/2023.  HPI  Today, she is being contacted for a post-procedure assessment. Discussed the use of AI scribe software for clinical note transcription with the patient, who gave verbal consent to proceed.  History of Present Illness   The patient, Cynthia Bennett, recently underwent a left-sided facet block for back pain at the pain clinic. Post-procedure, she reported complete resolution of the back pain, which has not recurred since. She also mentioned a recent onset  of sore throat and fever, unrelated to the procedure,  which she attributes to contact with her grandchild. She has been managing these symptoms with daily vitamin C intake.      Post-procedure evaluation   Type: Lumbar Facet, Medial Branch Block(s) #5           Laterality: Left  Level: L3, L4, L5, and S1 Medial Branch Level(s). Injecting these levels blocks the L4-5 and L5-S1 lumbar facet joints. Imaging: Fluoroscopic guidance Spinal ([id number]) Anesthesia: Local anesthesia (1-2% Lidocaine) Anxiolysis: None                 Sedation: No Sedation                       DOS: 09/29/2023 Performed by: Oswaldo Done, MD  Primary Purpose: Diagnostic/Therapeutic Indications: Low back pain severe enough to impact quality of life or function. 1. Chronic low back pain (Left) w/o sciatica   2. Lumbar facet joint pain   3. Lumbar facet hypertrophy (Bilateral)   4. Grade 1 spondylolisthesis of L4 over L5   5. Lumbar facet syndrome (Bilateral) (L>R)   6. Spondylosis without myelopathy or radiculopathy, lumbosacral region    NAS-11 Pain score:   Pre-procedure: 4 /10   Post-procedure: 4 /10      Effectiveness:  Initial hour after procedure:   100%. Subsequent 4-6 hours post-procedure:   100%. Analgesia past initial 6 hours:   100%. Ongoing improvement:  Analgesic: The patient indicates currently having an ongoing 100% relief of the low back pain.  She denies any lower extremity pain, numbness or weakness. Function: Cynthia Bennett reports improvement in function ROM: Cynthia Bennett reports improvement in ROM  Pharmacotherapy Assessment   Opioid Analgesic: Oxycodone IR 5 mg, 1 tab PO daily (maximum of 5 mg/day of oxycodone.) MME/day: 7.5 mg/day.   Monitoring: [facility] PMP: PDMP reviewed during this encounter.       Pharmacotherapy: No side-effects or adverse reactions reported. Compliance: No problems identified. Effectiveness: Clinically acceptable. Plan: Refer to "POC". UDS:  Summary  Date Value  Ref Range Status  04/22/2023 Note  Final    Comment:    ==================================================================== ToxASSU[employer] Select 13 (MW) ==================================================================== Specimen Alert Not Detected result may be consistent with the time of last use noted for this medication. AS NEEDED (Oxycodone) ==================================================================== Test                             Result       Flag       Units  Drug Absent but Declared for Prescription Verification   Oxycodone                      Not Detected UNEXPECTED ng/mg creat ==================================================================== Test                      Result    Flag   Units      Ref Range   Creatinine              65               mg/dL      >=54 ==================================================================== Declared Medications:  The flagging and interpretation on this report are based on the  following declared medications.  Unexpected results may arise from  inaccuracies in the declared medications.   **Note: The testing scope of this panel includes these medications:   Oxycodone   **Note: The testing scope  of this panel does not include the  following reported medications:   Acetaminophen (Tylenol)  Doxepin (Sinequan)  Esomeprazole (Nexium)  Eye Drops  Magnesium (Mag-Ox)  Naloxone (Narcan)  Ondansetron (Zofran)  Sertraline (Zoloft)  Trazodone (Desyrel)  Vitamin D3  Zolpidem (Ambien) ==================================================================== For clinical consultation, please call [phone]. ====================================================================    No results found for: "CBDTHCR", "D8THCCBX", "D9THCCBX"   Laboratory Chemistry Profile   Renal Lab Results  Component Value Date   BUN 16 06/10/2023   CREATININE 0.95 06/10/2023   BCR 11 (L) 01/10/2019   GFRAA 46 (L) 01/10/2019   GFRNONAA 58  (L) 06/10/2023    Hepatic Lab Results  Component Value Date   AST 17 07/31/2022   ALT 10 07/31/2022   ALBUMIN 3.6 07/31/2022   ALKPHOS 54 07/31/2022   LIPASE 139 04/27/2014    Electrolytes Lab Results  Component Value Date   NA 135 06/10/2023   K 3.8 06/10/2023   CL 105 06/10/2023   CALCIUM 8.8 (L) 06/10/2023   MG 2.3 01/10/2019    Bone Lab Results  Component Value Date   25OHVITD1 26 (L) 01/10/2019   25OHVITD2 1.6 01/10/2019   25OHVITD3 24 01/10/2019    Inflammation (CRP: Acute Phase) (ESR: Chronic Phase) Lab Results  Component Value Date   CRP 0.9 08/24/2020   ESRSEDRATE 51 (H) 01/10/2019         Note: Above Lab results reviewed.  Imaging  DG PAIN CLINIC C-ARM 1-60 MIN NO REPORT Fluoro was used, but no Radiologist interpretation will be provided.  Please refer to "NOTES" tab for provider progress note.  Assessment  The primary encounter diagnosis was Chronic low back pain (Left) w/o sciatica. Diagnoses of Lumbar facet joint pain, Lumbar facet syndrome (Bilateral) (L>R), Chronic hip pain (Left), and Postop check were also pertinent to this visit.  Plan of Care  Problem-specific:  Assessment and Plan    Post-facet block follow-up   She underwent a left-sided facet block during the last visit and reports complete resolution of back pain immediately post-procedure, remaining pain-free, which indicates a successful outcome. We will continue current management and follow up as needed.  Viral pharyngitis   She reports a sore throat and mild fever, likely secondary to a viral infection from close contact with a grandchild and is currently taking vitamin C daily. We will continue vitamin C, monitor symptoms, and advise seeking medical attention if they worsen.      Cynthia Bennett has a current medication list which includes the following long-term medication(s): doxepin, oxycodone, [START ON 10/23/2023] oxycodone, [START ON 11/22/2023] oxycodone, and  zolpidem.  Pharmacotherapy (Medications Ordered): No orders of the defined types were placed in this encounter.  Orders:  Orders Placed This Encounter  Procedures   Nursing Instructions:    Please complete this patient's postprocedure evaluation.    Scheduling Instructions:     Please complete this patient's postprocedure evaluation.   Follow-up plan:   No follow-ups on file.      Interventional Therapies  Risk Factors  Complex Considerations:  Advanced age   Planned  Pending:      Under consideration:   Palliative left L4-5 and L5-S1 lumbar facet MBB  Possible lumbar facet RFA    Completed:   Diagnostic left L5-S1 pseudoarthrosis inj. #1 (01/09/2022) (10/10 to 0/10)  Therapeutic left L4-5 LESI x8 (06/25/2021) (100/100/80/90)  Therapeutic right L4-5 LESI x1 (07/21/2017) (DNR-F/U)  Palliative right L5-S1 LESI x1 (09/27/2015) (100/100/90)  Therapeutic left L5-S1 LESI x1 (02/05/2016) (  100/100/100/100)  Diagnostic right SI joint injection x2 (08/25/2019) (100/100/40/30)  Diagnostic left SI joint injection x2 (01/27/2019) (100/100/100/100)  Diagnostic left glenohumeral joint inj. x1 (04/29/2017) (100/100/98/98)  Left biceps muscle trigger point inj. x1 (07/21/2017) (DNR-F/U)  Right PSIS trigger point inj. x1 (07/27/2018) (100/100/50/>50)  Diagnostic right lumbar facet MBB x3 (01/09/2022) (10/10 to 0/10)  Diagnostic left L4-5 and L5-S1 lumbar facet MBB x5 (09/29/2023) (100/100/100/100)  Diagnostic left IA hip inj. x1 (01/27/2019) (100/100/100/100)    Completed by other providers:   None at this time   Therapeutic  Palliative (PRN) options:   None established      Recent Visits Date Type Provider Dept  09/29/23 Procedure visit Delano Metz, MD [facility]  09/23/23 Office Visit Delano Metz, MD [facility]  08/19/23 Office Visit Delano Metz, MD [facility]  Showing recent visits within past 90 days and meeting all other  requirements Today's Visits Date Type Provider Dept  10/12/23 Office Visit Delano Metz, MD [facility]  Showing today's visits and meeting all other requirements Future Appointments Date Type Provider Dept  12/21/23 Appointment Delano Metz, MD [facility]  Showing future appointments within next 90 days and meeting all other requirements  I discussed the assessment and treatment plan with the patient. The patient was provided an opportunity to ask questions and all were answered. The patient agreed with the plan and demonstrated an understanding of the instructions.  Patient advised to call back or seek an in-person evaluation if the symptoms or condition worsens.  Duration of encounter: 12 minutes.  Note by: Oswaldo Done, MD Date: 10/12/2023; Time: 11:59 AM

## 2023-10-15 ENCOUNTER — Telehealth: Payer: Self-pay | Admitting: *Deleted

## 2023-10-15 ENCOUNTER — Encounter: Payer: Self-pay | Admitting: Internal Medicine

## 2023-10-15 ENCOUNTER — Inpatient Hospital Stay: Payer: PPO | Attending: Internal Medicine

## 2023-10-15 DIAGNOSIS — Z886 Allergy status to analgesic agent status: Secondary | ICD-10-CM | POA: Insufficient documentation

## 2023-10-15 DIAGNOSIS — Z87891 Personal history of nicotine dependence: Secondary | ICD-10-CM | POA: Diagnosis not present

## 2023-10-15 DIAGNOSIS — G8929 Other chronic pain: Secondary | ICD-10-CM | POA: Insufficient documentation

## 2023-10-15 DIAGNOSIS — R5382 Chronic fatigue, unspecified: Secondary | ICD-10-CM | POA: Insufficient documentation

## 2023-10-15 DIAGNOSIS — K5900 Constipation, unspecified: Secondary | ICD-10-CM | POA: Diagnosis not present

## 2023-10-15 DIAGNOSIS — Z818 Family history of other mental and behavioral disorders: Secondary | ICD-10-CM | POA: Insufficient documentation

## 2023-10-15 DIAGNOSIS — R634 Abnormal weight loss: Secondary | ICD-10-CM | POA: Diagnosis not present

## 2023-10-15 DIAGNOSIS — D631 Anemia in chronic kidney disease: Secondary | ICD-10-CM | POA: Insufficient documentation

## 2023-10-15 DIAGNOSIS — D72829 Elevated white blood cell count, unspecified: Secondary | ICD-10-CM | POA: Insufficient documentation

## 2023-10-15 DIAGNOSIS — Z9049 Acquired absence of other specified parts of digestive tract: Secondary | ICD-10-CM | POA: Insufficient documentation

## 2023-10-15 DIAGNOSIS — Z79899 Other long term (current) drug therapy: Secondary | ICD-10-CM | POA: Diagnosis not present

## 2023-10-15 DIAGNOSIS — Z825 Family history of asthma and other chronic lower respiratory diseases: Secondary | ICD-10-CM | POA: Insufficient documentation

## 2023-10-15 DIAGNOSIS — M549 Dorsalgia, unspecified: Secondary | ICD-10-CM | POA: Diagnosis not present

## 2023-10-15 DIAGNOSIS — Z8673 Personal history of transient ischemic attack (TIA), and cerebral infarction without residual deficits: Secondary | ICD-10-CM | POA: Insufficient documentation

## 2023-10-15 DIAGNOSIS — Z90710 Acquired absence of both cervix and uterus: Secondary | ICD-10-CM | POA: Diagnosis not present

## 2023-10-15 DIAGNOSIS — N1832 Chronic kidney disease, stage 3b: Secondary | ICD-10-CM | POA: Insufficient documentation

## 2023-10-15 LAB — CBC WITH DIFFERENTIAL (CANCER CENTER ONLY)
Abs Immature Granulocytes: 0.12 10*3/uL — ABNORMAL HIGH (ref 0.00–0.07)
Basophils Absolute: 0.1 10*3/uL (ref 0.0–0.1)
Basophils Relative: 1 %
Eosinophils Absolute: 0.2 10*3/uL (ref 0.0–0.5)
Eosinophils Relative: 1 %
HCT: 42.9 % (ref 36.0–46.0)
Hemoglobin: 14.1 g/dL (ref 12.0–15.0)
Immature Granulocytes: 1 %
Lymphocytes Relative: 14 %
Lymphs Abs: 1.8 10*3/uL (ref 0.7–4.0)
MCH: 29.6 pg (ref 26.0–34.0)
MCHC: 32.9 g/dL (ref 30.0–36.0)
MCV: 89.9 fL (ref 80.0–100.0)
Monocytes Absolute: 0.9 10*3/uL (ref 0.1–1.0)
Monocytes Relative: 6 %
Neutro Abs: 10.5 10*3/uL — ABNORMAL HIGH (ref 1.7–7.7)
Neutrophils Relative %: 77 %
Platelet Count: 317 10*3/uL (ref 150–400)
RBC: 4.77 MIL/uL (ref 3.87–5.11)
RDW: 12.6 % (ref 11.5–15.5)
WBC Count: 13.5 10*3/uL — ABNORMAL HIGH (ref 4.0–10.5)
nRBC: 0 % (ref 0.0–0.2)

## 2023-10-15 LAB — IRON AND TIBC
Iron: 23 ug/dL — ABNORMAL LOW (ref 28–170)
Saturation Ratios: 8 % — ABNORMAL LOW (ref 10.4–31.8)
TIBC: 297 ug/dL (ref 250–450)
UIBC: 274 ug/dL

## 2023-10-15 LAB — FERRITIN: Ferritin: 137 ng/mL (ref 11–307)

## 2023-10-15 LAB — BASIC METABOLIC PANEL - CANCER CENTER ONLY
Anion gap: 8 (ref 5–15)
BUN: 19 mg/dL (ref 8–23)
CO2: 24 mmol/L (ref 22–32)
Calcium: 8.9 mg/dL (ref 8.9–10.3)
Chloride: 107 mmol/L (ref 98–111)
Creatinine: 0.94 mg/dL (ref 0.44–1.00)
GFR, Estimated: 58 mL/min — ABNORMAL LOW (ref >60)
Glucose, Bld: 139 mg/dL — ABNORMAL HIGH (ref 70–99)
Potassium: 3.9 mmol/L (ref 3.5–5.1)
Sodium: 139 mmol/L (ref 135–145)

## 2023-10-15 NOTE — Telephone Encounter (Signed)
Call returned to Ms Cynthia Bennett and informed that we cannot accommodate patient tomorrow either and that she should contact her PCP regarding elevated WBC for possible infection. She agrees to this I also informed her that the Iron levels are not yet resulted and she should keep her appointment for next week

## 2023-10-15 NOTE — Telephone Encounter (Signed)
Daughter called reporting that patient is very weak and needs to be seen and get infusion before next week and is asking if there is any way she can be seen sooner. She had asked if patient could get infusion today, but I explained to her that we could not accommodate her today. Please advise,  Of note her WBC is elevated today          Component Ref Range & Units 13:06 (10/15/23) 4 mo ago (06/10/23) 7 mo ago (03/13/23) 11 mo ago (11/07/22) 1 yr ago (07/31/22) 1 yr ago (05/20/22) 1 yr ago (05/07/22)  WBC Count 4.0 - 10.5 K/uL 13.5 High  10.2 8.8 10.8 High  8.2 7.2 7.7  RBC 3.87 - 5.11 MIL/uL 4.77 4.55 3.95 4.85 3.62 Low  4.39 3.96  Hemoglobin 12.0 - 15.0 g/dL 19.1 47.8 Low  29.5 Low  13.8 9.8 Low  10.9 Low  9.3 Low   HCT 36.0 - 46.0 % 42.9 38.3 35.1 Low  42.3 31.2 Low  36.1 32.5 Low   MCV 80.0 - 100.0 fL 89.9 84.2 88.9 87.2 86.2 82.2 82.1  MCH 26.0 - 34.0 pg 29.6 25.9 Low  27.8 28.5 27.1 24.8 Low  23.5 Low   MCHC 30.0 - 36.0 g/dL 62.1 30.8 65.7 84.6 96.2 30.2 28.6 Low   RDW 11.5 - 15.5 % 12.6 15.2 12.4 14.3 15.7 High  23.1 High  22.5 High   Platelet Count 150 - 400 K/uL 317 380 381 353 352 353 358  nRBC 0.0 - 0.2 % 0.0 0.0 0.0 0.0 CM 0.0 0.0 0.0  Neutrophils Relative % % 77 69 63  48 59 62  Neutro Abs 1.7 - 7.7 K/uL 10.5 High  7.1 5.6  4.0 4.2 4.8  Lymphocytes Relative % 14 20 23   37 29 24  Lymphs Abs 0.7 - 4.0 K/uL 1.8 2.0 2.0  3.0 2.1 1.8  Monocytes Relative % 6 7 7  9 7 7   Monocytes Absolute 0.1 - 1.0 K/uL 0.9 0.7 0.6  0.7 0.5 0.6  Eosinophils Relative % 1 2 5  5 4 5   Eosinophils Absolute 0.0 - 0.5 K/uL 0.2 0.2 0.4  0.4 0.3 0.4  Basophils Relative % 1 1 1  1 1 1   Basophils Absolute 0.0 - 0.1 K/uL 0.1 0.1 0.1  0.1 0.1 0.1  Immature Granulocytes % 1 1 1   0 0 1  Abs Immature Granulocytes 0.00 - 0.07 K/uL 0.12 High  0.06 CM 0.04 CM  0.02 CM 0.02 CM

## 2023-10-16 DIAGNOSIS — R300 Dysuria: Secondary | ICD-10-CM | POA: Diagnosis not present

## 2023-10-16 DIAGNOSIS — R350 Frequency of micturition: Secondary | ICD-10-CM | POA: Diagnosis not present

## 2023-10-22 ENCOUNTER — Encounter: Payer: Self-pay | Admitting: Internal Medicine

## 2023-10-22 ENCOUNTER — Inpatient Hospital Stay: Payer: PPO | Admitting: Internal Medicine

## 2023-10-22 ENCOUNTER — Inpatient Hospital Stay: Payer: PPO

## 2023-10-22 VITALS — BP 93/58 | HR 81 | Temp 98.0°F | Resp 18

## 2023-10-22 VITALS — BP 123/73 | HR 98 | Temp 98.4°F | Ht 62.0 in | Wt 149.0 lb

## 2023-10-22 DIAGNOSIS — D631 Anemia in chronic kidney disease: Secondary | ICD-10-CM | POA: Diagnosis not present

## 2023-10-22 DIAGNOSIS — E611 Iron deficiency: Secondary | ICD-10-CM

## 2023-10-22 DIAGNOSIS — N1832 Chronic kidney disease, stage 3b: Secondary | ICD-10-CM | POA: Diagnosis not present

## 2023-10-22 MED ORDER — SODIUM CHLORIDE 0.9% FLUSH
10.0000 mL | Freq: Two times a day (BID) | INTRAVENOUS | Status: DC
  Administered 2023-10-22: 10 mL via INTRAVENOUS
  Filled 2023-10-22: qty 10

## 2023-10-22 MED ORDER — IRON SUCROSE 20 MG/ML IV SOLN
200.0000 mg | Freq: Once | INTRAVENOUS | Status: AC
Start: 2023-10-22 — End: 2023-10-22
  Administered 2023-10-22: 200 mg via INTRAVENOUS

## 2023-10-22 NOTE — Progress Notes (Signed)
[facility] CONSULT NOTE  Patient Care Team: Jerl Mina, MD as PCP - General (Family Medicine) Earna Coder, MD as Consulting Physician (Hematology and Oncology)  CHIEF COMPLAINTS/PURPOSE OF CONSULTATION: Anemia   HEMATOLOGY HISTORY  # CHRONIC NORMOCYTIC ANEMIA [since 2019-PCP- Hb 8-9; Iron sat- 5%;NO Ferritin; GFR- 43]EGD/colonoscopy-?2013-2104- pt declines procedures; Intolerance to p.o. iron. IV Venofer-  #CKD stage III/chronic pain back-pain clinic narcotics.  HISTORY OF PRESENTING ILLNESS: In a wheelchair.  Accompanied by family.   Cynthia Bennett 89 y.o.  female history of anemia secondary ?chronic kidney disease vs others is here for follow-up.  Most recently patient fell 08/12/23, broke pelvis, [facility]  However conservative measures.  Patient is up and about walking with a walker at this time.   Chronic fatigue not any worse or better.  Patient not on oral iron because of constipation.  Denies any blood in stools or black or stools.  Review of Systems  Constitutional:  Positive for malaise/fatigue. Negative for chills, diaphoresis and fever.  HENT:  Negative for nosebleeds and sore throat.   Eyes:  Negative for double vision.  Respiratory:  Negative for cough, hemoptysis, sputum production, shortness of breath and wheezing.   Cardiovascular:  Negative for chest pain, palpitations, orthopnea and leg swelling.  Gastrointestinal:  Negative for abdominal pain, blood in stool, constipation, diarrhea, heartburn, melena, nausea and vomiting.  Genitourinary:  Negative for dysuria, frequency and urgency.  Musculoskeletal:  Positive for back pain and joint pain.  Skin: Negative.  Negative for itching and rash.  Neurological:  Negative for dizziness, tingling, focal weakness, weakness and headaches.  Endo/Heme/Allergies:  Does not bruise/bleed easily.  Psychiatric/Behavioral:  Negative for depression. The patient is not nervous/anxious and does not have insomnia.      MEDICAL HISTORY:  Past Medical History:  Diagnosis Date   Dizzy spells    Fibromyalgia    Glaucoma    Hiatal hernia    Osteoarthritis    Osteoarthritis of spine with radiculopathy, lumbar region 08/14/2015   Peptic ulcer disease    Reflux    Spinal stenosis    Stroke (HCC)    TIA (transient ischemic attack)     SURGICAL HISTORY: Past Surgical History:  Procedure Laterality Date   ABDOMINAL HYSTERECTOMY     APPENDECTOMY     ESOPHAGOGASTRODUODENOSCOPY N/A 04/16/2015   Procedure: ESOPHAGOGASTRODUODENOSCOPY (EGD);  Surgeon: Wallace Cullens, MD;  Location: [facility] ENDOSCOPY;  Service: Gastroenterology;  Laterality: N/A;    SOCIAL HISTORY: Social History   Socioeconomic History   Marital status: Widowed    Spouse name: Not on file   Number of children: Not on file   Years of education: Not on file   Highest education level: Not on file  Occupational History   Not on file  Tobacco Use   Smoking status: Former   Smokeless tobacco: Never  Vaping Use   Vaping status: Never Used  Substance and Sexual Activity   Alcohol use: No    Alcohol/week: 0.0 standard drinks of alcohol   Drug use: No   Sexual activity: Not on file  Other Topics Concern   Not on file  Social History Narrative   Pt lives in [place]; with son. Quit smoking in 1957. No alcohol. Designed clothes/ alterations.    Social Drivers of Health   Financial Resource Strain: Patient Declined (03/30/2023)   Received from [facility], [facility]   Overall Financial Resource Strain (CARDIA)    Difficulty of Paying  Living Expenses: Patient declined  Food Insecurity: Patient Declined (03/30/2023)   Received [facility], [facility]   Hunger Vital Sign    Worried About Running Out of Food in the Last Year: Patient declined    Ran Out of Food in the Last Year: Patient declined  Transportation Needs: Patient Declined (03/30/2023)   Received from  [facility], [facility]   PRAPARE - Transportation    In the past 12 months, has lack of transportation kept you from medical appointments or from getting medications?: Patient declined    Lack of Transportation (Non-Medical): Patient declined  Physical Activity: Not on file  Stress: Not on file  Social Connections: Not on file  Intimate Partner Violence: Not on file    FAMILY HISTORY: Family History  Problem Relation Age of Onset   Mental illness Mother    COPD Father     ALLERGIES:  is allergic to ibuprofen.  MEDICATIONS:  Current Outpatient Medications  Medication Sig Dispense Refill   acetaminophen (TYLENOL) 325 MG tablet Take 650 mg by mouth every 6 (six) hours as needed.     chlorhexidine (PERIDEX) 0.12 % solution SMARTSIG:By Mouth     Cholecalciferol (VITAMIN D3) 20 MCG (800 UNIT) TABS Take by mouth 2 (two) times daily.     cyanocobalamin (VITAMIN B12) 1000 MCG/ML injection Inject 1,000 mcg into the muscle every 30 (thirty) days.     diclofenac Sodium (VOLTAREN) 1 % GEL Apply topically.     dorzolamide-timolol (COSOPT) 22.3-6.8 MG/ML ophthalmic solution Place 1 drop into both eyes in the morning and at bedtime.     doxepin (SINEQUAN) 100 MG capsule Take 100 mg by mouth at bedtime.     esomeprazole (NEXIUM) 40 MG capsule Take 40 mg by mouth 2 (two) times daily.     Guaifenesin 1200 MG TB12 Take 1 tablet by mouth daily.     magnesium oxide (MAG-OX) 400 MG tablet Take by mouth.     naloxone (NARCAN) nasal spray 4 mg/0.1 mL Place 1 spray into the nose as needed for up to 365 doses (for opioid-induced respiratory depresssion). In case of emergency (overdose), spray once into each nostril. If no response within 3 minutes, repeat application and call 911. 1 each 0   ondansetron (ZOFRAN) 8 MG tablet TAKE 1 TABLET BY MOUTH EVERY 8 HOURS AS NEEDED FOR NAUSEA AND VOMITING 45 tablet 1   oxyCODONE (OXY IR/ROXICODONE) 5 MG immediate release tablet Take  1 tablet (5 mg total) by mouth daily as needed for severe pain (pain score 7-10). Must last 30 days 30 tablet 0   [START ON 10/23/2023] oxyCODONE (OXY IR/ROXICODONE) 5 MG immediate release tablet Take 1 tablet (5 mg total) by mouth daily as needed for severe pain (pain score 7-10). Must last 30 days 30 tablet 0   [START ON 11/22/2023] oxyCODONE (OXY IR/ROXICODONE) 5 MG immediate release tablet Take 1 tablet (5 mg total) by mouth daily as needed for severe pain (pain score 7-10). Must last 30 days 30 tablet 0   sertraline (ZOLOFT) 100 MG tablet Take 100 mg by mouth daily.     zolpidem (AMBIEN) 10 MG tablet TAKE 1/2 TO 1 TABLET BY MOUTH EVERY NIGHT AT BEDTIME     No current facility-administered medications for this visit.      PHYSICAL EXAMINATION:   Vitals:   10/22/23 1034  BP: 123/73  Pulse: 98  Temp: 98.4 F (36.9 C)  SpO2: 98%  Filed Weights   10/22/23 1034  Weight: 149 lb (67.6 kg)      Physical Exam HENT:     Head: Normocephalic and atraumatic.     Mouth/Throat:     Pharynx: No oropharyngeal exudate.  Eyes:     Pupils: Pupils are equal, round, and reactive to light.  Cardiovascular:     Rate and Rhythm: Normal rate and regular rhythm.  Pulmonary:     Effort: Pulmonary effort is normal. No respiratory distress.     Breath sounds: Normal breath sounds. No wheezing.  Abdominal:     General: Bowel sounds are normal. There is no distension.     Palpations: Abdomen is soft. There is no mass.     Tenderness: There is no abdominal tenderness. There is no guarding or rebound.  Musculoskeletal:        General: No tenderness. Normal range of motion.     Cervical back: Normal range of motion and neck supple.  Skin:    General: Skin is warm.  Neurological:     Mental Status: She is alert and oriented to person, place, and time.  Psychiatric:        Mood and Affect: Affect normal.     LABORATORY DATA:  I have reviewed the data as listed Lab Results  Component  Value Date   WBC 13.5 (H) 10/15/2023   HGB 14.1 10/15/2023   HCT 42.9 10/15/2023   MCV 89.9 10/15/2023   PLT 317 10/15/2023   Recent Labs    03/13/23 1245 06/10/23 1520 10/15/23 1306  NA 137 135 139  K 4.1 3.8 3.9  CL 106 105 107  CO2 23 22 24   GLUCOSE 125* 109* 139*  BUN 14 16 19   CREATININE 0.97 0.95 0.94  CALCIUM 9.0 8.8* 8.9  GFRNONAA 57* 58* 58*     DG PAIN CLINIC C-ARM 1-60 MIN NO REPORT Result Date: 09/29/2023 Fluoro was used, but no Radiologist interpretation will be provided. Please refer to "NOTES" tab for provider progress note.   Anemia due to stage 3b chronic kidney disease (HCC) # Anemia iron def/CKD stageIII : Hb-8-9 [MCV- 77] s/p IV iron infusion.    # hemoglobin to 14; but Iron sat- 8%; Ferritin- 136- [PO iron - constipation] - proceed with Venofer today  # Etiology: Unclear etiology.  GI versus CKD.  colo > 10 years ago; patient reluctant with GI procedures in the past. Recommended CT CAP; declines imaging.   # Mild leukocytosis/neutrophilia \; WBC 13- no signs of infection- a monitor for now.    # CKD stage III-[GFR 57]-  stable.   # Constipation: ? Narcotics-defer to PCP/pain clinic-  stable.   # Chronic back pain/? scaitica [Dr.Neivera]- stable.   # weight loss- s/p dental extraction. S/p referral to nutrition- improved.   # DISPOSITION:  # proceed venofer  # follow up in 6 months- MD;labs- cbc/bmp/iron studies/ferritin; b12 levels-- possible IV venofer- Dr.B  Cc; Dr.Hedrick   All questions were answered. The patient knows to call the clinic with any problems, questions or concerns.      Earna Coder, MD 10/22/2023 10:55 AM

## 2023-10-22 NOTE — Progress Notes (Signed)
Fatigue/weakness: yes, having more weakness Dyspena: no Light headedness: no Blood in stool: no   Larey Seat 08/12/23, broke pelvis, [facility].  Appetite is 50% normal, goes up and down, ensure complete 2/day.

## 2023-10-22 NOTE — Assessment & Plan Note (Addendum)
#   Anemia iron def/CKD stageIII : Hb-8-9 [MCV- 77] s/p IV iron infusion.    # hemoglobin to 14; but Iron sat- 8%; Ferritin- 136- [PO iron - constipation] - proceed with Venofer today  # Etiology: Unclear etiology.  GI versus CKD.  colo > 10 years ago; patient reluctant with GI procedures in the past. Recommended CT CAP; declines imaging.   # Mild leukocytosis/neutrophilia \; WBC 13- no signs of infection- a monitor for now.    # CKD stage III-[GFR 57]-  stable.   # Constipation: ? Narcotics-defer to PCP/pain clinic-  stable.   # Chronic back pain/? scaitica [Dr.Neivera]- stable.   # weight loss- s/p dental extraction. S/p referral to nutrition- improved.   # DISPOSITION:  # proceed venofer  # follow up in 6 months- MD;labs- cbc/bmp/iron studies/ferritin; b12 levels-- possible IV venofer- Dr.B  Cc; Dr.Hedrick

## 2023-12-20 NOTE — Progress Notes (Signed)
 PROVIDER NOTE: Information contained herein reflects review and annotations entered in association with encounter. Interpretation of such information and data should be left to medically-trained personnel. Information provided to patient can be located elsewhere in the medical record under "Patient Instructions". Document created using STT-dictation technology, any transcriptional errors that may result from process are unintentional.    Patient: Cynthia Bennett  Service Category: E/M  Provider: Candi Chafe, MD  DOB: [birth year]  DOS: 12/21/2023  Referring Provider: Lyle San, MD  MRN: [id number]  Specialty: Interventional Pain Management  PCP: Lyle San, MD  Type: Established Patient  Setting: Ambulatory outpatient    Location: Office  Delivery: Face-to-face     HPI  [name], a 89 y.o. year old female, is here today because of her Chronic pain syndrome [G89.4]. Ms. Dorothy primary complain today is Back Pain  Pertinent problems: Ms. Litt has Fibromyalgia; Chronic pain syndrome; Lumbar radicular pain (Bilateral) (R>L); Chronic low back pain (1ry area of Pain) (Bilateral) (L>R) w/o sciatica; Grade 1 spondylolisthesis of L4 over L5; Lumbar discogenic pain syndrome; Lumbar facet syndrome (Bilateral) (L>R); Lumbar facet hypertrophy (Bilateral); Chronic sacroiliac joint pain (Left); Chronic hip pain (Left); Lumbar spondylosis; Abnormal MRI, lumbar spine (06/25/2016); Cervical spondylosis (Anterolisthesis of C5 over C6); Chronic lower extremity pain (2ry area of Pain) (Left); Chronic lumbar radicular pain (Left) (L5 Dermatome); Osteoarthritis of hip (Left); Chronic sacroiliac joint pain (Bilateral) (R>L); Chronic shoulder pain (Left); Osteoarthritis of shoulder (Left); Chronic sacroiliac joint pain (Right); Chronic hip pain (Right); Left upper arm pain; Myofascial pain syndrome (left upper extremity); Acromioclavicular joint pain (Left); Osteoarthritis of AC (acromioclavicular) joint (Left);  Supraspinatus tendon tear, sequela (Left); Spondylosis without myelopathy or radiculopathy, lumbosacral region; Other specified dorsopathies, sacral and sacrococcygeal region; Bertolotti's syndrome (Left); DDD (degenerative disc disease), lumbar; Severe (5-6 mm) L4-5 lumbar spinal stenosis and (8 mm) L3-4 spinal stenosis.; Osteoarthritis of hip (Right); Trigger point with back pain (Right); Chronic myofascial pain; Chronic low back pain (Right) w/o sciatica; Chronic hip pain (3ry area of Pain) (Bilateral) (R>L); Osteoarthritis of hips (Bilateral); Chronic low back pain (Left) w/o sciatica; Thoracic back pain (Left); DDD (degenerative disc disease), thoracic; DDD (degenerative disc disease), thoracolumbar; Scoliosis of thoracolumbar region due to degenerative disease of spine in adult; Weakness of left leg; Numbness and tingling of left leg; Lumbar facet joint pain; and Pseudoarthrosis of lumbar spine on their pertinent problem list. Pain Assessment: Severity of Chronic pain is reported as a 5 /10. Location: Back Lower/left leg numbness from foot up to knee. Onset: More than a month ago. Quality: Numbness, Aching. Timing: Intermittent. Modifying factor(s): rest, meds. Vitals:  height is 5\' 2"  (1.575 m) and weight is 140 lb (63.5 kg). Her temperature is 97.4 F (36.3 C) (abnormal). Her blood pressure is 111/92 (abnormal) and her pulse is 96. Her respiration is 16 and oxygen saturation is 94%.  BMI: Estimated body mass index is 25.61 kg/m as calculated from the following:   Height as of this encounter: 5\' 2"  (1.575 m).   Weight as of this encounter: 140 lb (63.5 kg). Last encounter: 10/12/2023. Last procedure: 09/29/2023.  Reason for encounter: medication management.  The patient indicates doing well with the current medication regimen. No adverse reactions or side effects reported to the medications.   Discussed the use of AI scribe software for clinical note transcription with the patient, who gave verbal  consent to proceed.  History of Present Illness   Cynthia Bennett is an 89 year old female  who presents for a follow-up regarding her pain medication management.  She accidentally discarded her pain medication pills last Wednesday while cleaning out her drawers. She was alone at the time, leading her to believe she must have discarded them by mistake.  In October, she experienced a fall, and her children stayed with her 24 hours a day for three weeks, moving things around in her house.  She confirms that she is not experiencing any side effects or adverse reactions to her pain medication. Her pharmacy remains the same, and she is compliant with the prescription monitoring program requirements.     RTCB: 01/21/2024    Pharmacotherapy Assessment  Analgesic: Oxycodone  IR 5 mg, 1 tab PO daily (maximum of 5 mg/day of oxycodone .) MME/day: 7.5 mg/day.   Monitoring: [place] PMP: PDMP reviewed during this encounter.       Pharmacotherapy: No side-effects or adverse reactions reported. Compliance: No problems identified. Effectiveness: Clinically acceptable.  Humberto Magnus, RN  12/21/2023  1:45 PM  Sign when Signing Visit Nursing Pain Medication Assessment:  Safety precautions to be maintained throughout the outpatient stay will include: orient to surroundings, keep bed in low position, maintain call bell within reach at all times, provide assistance with transfer out of bed and ambulation.  Medication Inspection Compliance: Ms. Dhaliwal did not comply with our request to bring her pills to be counted. She was reminded that bringing the medication bottles, even when empty, is a requirement.  Medication: None brought in. Pill/Patch Count: None available to be counted. Bottle Appearance: No container available. Did not bring bottle(s) to appointment. Filled Date: N/A Last Medication intake:   last week.  Patient thinks she threw her pills away.  Can't find them     No results found for: "CBDTHCR" No results  found for: "D8THCCBX" No results found for: "D9THCCBX"  UDS:  Summary  Date Value Ref Range Status  04/22/2023 Note  Final    Comment:    ==================================================================== [employer] Select 13 (MW) ==================================================================== Specimen Alert Not Detected result may be consistent with the time of last use noted for this medication. AS NEEDED (Oxycodone ) ==================================================================== Test                             Result       Flag       Units  Drug Absent but Declared for Prescription Verification   Oxycodone                       Not Detected UNEXPECTED ng/mg creat ==================================================================== Test                      Result    Flag   Units      Ref Range   Creatinine              65               mg/dL      >=96 ==================================================================== Declared Medications:  The flagging and interpretation on this report are based on the  following declared medications.  Unexpected results may arise from  inaccuracies in the declared medications.   **Note: The testing scope of this panel includes these medications:   Oxycodone    **Note: The testing scope of this panel does not include the  following reported medications:   Acetaminophen  (Tylenol )  Doxepin (Sinequan)  Esomeprazole (Nexium)  Eye Drops  Magnesium (  Mag-Ox)  Naloxone  (Narcan )  Ondansetron  (Zofran )  Sertraline (Zoloft)  Trazodone (Desyrel)  Vitamin D3  Zolpidem (Ambien) ==================================================================== For clinical consultation, please call [phone]. ====================================================================       ROS  Constitutional: Denies any fever or chills Gastrointestinal: No reported hemesis, hematochezia, vomiting, or acute GI distress Musculoskeletal: Denies any  acute onset joint swelling, redness, loss of ROM, or weakness Neurological: No reported episodes of acute onset apraxia, aphasia, dysarthria, agnosia, amnesia, paralysis, loss of coordination, or loss of consciousness  Medication Review  Guaifenesin, Vitamin D3, acetaminophen , chlorhexidine, cyanocobalamin , diclofenac Sodium, dorzolamide-timolol, doxepin, esomeprazole, magnesium oxide, naloxone , ondansetron , oxyCODONE , sertraline, and zolpidem  History Review  Allergy: Ms. Mitsch is allergic to ibuprofen. Drug: Ms. Mirkin  reports no history of drug use. Alcohol:  reports no history of alcohol use. Tobacco:  reports that she has quit smoking. She has never used smokeless tobacco. Social: Ms. Kender  reports that she has quit smoking. She has never used smokeless tobacco. She reports that she does not drink alcohol and does not use drugs. Medical:  has a past medical history of Dizzy spells, Fibromyalgia, Glaucoma, Hiatal hernia, Osteoarthritis, Osteoarthritis of spine with radiculopathy, lumbar region (08/14/2015), Peptic ulcer disease, Reflux, Spinal stenosis, Stroke (HCC), and TIA (transient ischemic attack). Surgical: Ms. Glatt  has a past surgical history that includes Esophagogastroduodenoscopy (N/A, 04/16/2015); Appendectomy; and Abdominal hysterectomy. Family: family history includes COPD in her father; Mental illness in her mother.  Laboratory Chemistry Profile   Renal Lab Results  Component Value Date   BUN 19 10/15/2023   CREATININE 0.94 10/15/2023   BCR 11 (L) 01/10/2019   GFRAA 46 (L) 01/10/2019   GFRNONAA 58 (L) 10/15/2023    Hepatic Lab Results  Component Value Date   AST 17 07/31/2022   ALT 10 07/31/2022   ALBUMIN 3.6 07/31/2022   ALKPHOS 54 07/31/2022   LIPASE 139 04/27/2014    Electrolytes Lab Results  Component Value Date   NA 139 10/15/2023   K 3.9 10/15/2023   CL 107 10/15/2023   CALCIUM 8.9 10/15/2023   MG 2.3 01/10/2019    Bone Lab Results  Component Value  Date   25OHVITD1 26 (L) 01/10/2019   25OHVITD2 1.6 01/10/2019   25OHVITD3 24 01/10/2019    Inflammation (CRP: Acute Phase) (ESR: Chronic Phase) Lab Results  Component Value Date   CRP 0.9 08/24/2020   ESRSEDRATE 51 (H) 01/10/2019         Note: Above Lab results reviewed.  Recent Imaging Review  DG PAIN CLINIC C-ARM 1-60 MIN NO REPORT Fluoro was used, but no Radiologist interpretation will be provided.  Please refer to "NOTES" tab for provider progress note. Note: Reviewed        Physical Exam  General appearance: Well nourished, well developed, and well hydrated. In no apparent acute distress Mental status: Alert, oriented x 3 (person, place, & time)       Respiratory: No evidence of acute respiratory distress Eyes: PERLA Vitals: BP (!) 111/92   Pulse 96   Temp (!) 97.4 F (36.3 C)   Resp 16   Ht 5\' 2"  (1.575 m)   Wt 140 lb (63.5 kg)   SpO2 94%   BMI 25.61 kg/m  BMI: Estimated body mass index is 25.61 kg/m as calculated from the following:   Height as of this encounter: 5\' 2"  (1.575 m).   Weight as of this encounter: 140 lb (63.5 kg). Ideal: Ideal body weight: 50.1 kg (110 lb 7.2 oz) Adjusted  ideal body weight: 55.5 kg (122 lb 4.3 oz)  Assessment   Diagnosis Status  1. Chronic pain syndrome   2. Chronic low back pain (1ry area of Pain) (Bilateral) (L>R) w/o sciatica   3. Chronic lower extremity pain (2ry area of Pain) (Left)   4. Chronic hip pain (3ry area of Pain) (Bilateral) (R>L)   5. Severe (5-6 mm) L4-5 lumbar spinal stenosis and (8 mm) L3-4 spinal stenosis.   6. Bertolotti's syndrome (Left)   7. Lumbar facet hypertrophy (Bilateral)   8. Grade 1 spondylolisthesis of L4 over L5   9. Chronic lumbar radicular pain (Left) (L5 Dermatome)   10. Scoliosis of thoracolumbar region due to degenerative disease of spine in adult   11. DDD (degenerative disc disease), thoracolumbar   12. Pharmacologic therapy   13. Chronic use of opiate for therapeutic purpose   14.  Encounter for medication management   15. Encounter for chronic pain management    Controlled Controlled Controlled   Updated Problems: No problems updated.  Plan of Care  Problem-specific:  Assessment and Plan    Chronic Pain Management Accidentally discarded pain medication last Wednesday while cleaning. No side effects or adverse reactions to medication. Compliant with the prescription monitoring program; urine drug screening not required. Emphasized importance of bringing pills or empty bottles for counting. Advised use of a small safe for storing controlled substances to prevent future incidents. Follow-up in 30 days to ensure compliance and provide a new prescription. Send prescription to pharmacy. Schedule follow-up in 30 days. Provide a copy of the rules and regulations for controlled substances. Advise to bring pills and empty bottles for counting at next visit.      Ms. MAYBELL LAFRENIER has a current medication list which includes the following long-term medication(s): doxepin, [START ON 12/22/2023] oxycodone , and zolpidem.  Pharmacotherapy (Medications Ordered): Meds ordered this encounter  Medications   oxyCODONE  (OXY IR/ROXICODONE ) 5 MG immediate release tablet    Sig: Take 1 tablet (5 mg total) by mouth daily as needed for severe pain (pain score 7-10). Must last 30 days    Dispense:  30 tablet    Refill:  0    DO NOT: delete (not duplicate); no partial-fill (will deny script to complete), no refill request (F/U required). DISPENSE: 1 day early if closed on fill date. WARN: No CNS-depressants within 8 hrs of med.   Orders:  No orders of the defined types were placed in this encounter.  Follow-up plan:   Return in about 31 days (around 01/21/2024) for Eval-day (M,W), (F2F), (MM).      Interventional Therapies  Risk Factors  Complex Considerations:  Advanced age   Planned  Pending:      Under consideration:   Palliative left L4-5 and L5-S1 lumbar facet MBB  Possible  lumbar facet RFA    Completed:   Diagnostic left L5-S1 pseudoarthrosis inj. #1 (01/09/2022) (10/10 to 0/10)  Therapeutic left L4-5 LESI x8 (06/25/2021) (100/100/80/90)  Therapeutic right L4-5 LESI x1 (07/21/2017) (DNR-F/U)  Palliative right L5-S1 LESI x1 (09/27/2015) (100/100/90)  Therapeutic left L5-S1 LESI x1 (02/05/2016) (100/100/100/100)  Diagnostic right SI joint injection x2 (08/25/2019) (100/100/40/30)  Diagnostic left SI joint injection x2 (01/27/2019) (100/100/100/100)  Diagnostic left glenohumeral joint inj. x1 (04/29/2017) (100/100/98/98)  Left biceps muscle trigger point inj. x1 (07/21/2017) (DNR-F/U)  Right PSIS trigger point inj. x1 (07/27/2018) (100/100/50/>50)  Diagnostic right lumbar facet MBB x3 (01/09/2022) (10/10 to 0/10)  Diagnostic left L4-5 and L5-S1 lumbar facet MBB x5 (09/29/2023) (  100/100/100/100)  Diagnostic left IA hip inj. x1 (01/27/2019) (100/100/100/100)    Completed by other providers:   None at this time   Therapeutic  Palliative (PRN) options:   None established     Recent Visits Date Type Provider Dept  10/12/23 Office Visit Renaldo Caroli, MD [facility]  09/29/23 Procedure visit Renaldo Caroli, MD [facility]  09/23/23 Office Visit Renaldo Caroli, MD [facility]  Showing recent visits within past 90 days and meeting all other requirements Today's Visits Date Type Provider Dept  12/21/23 Office Visit Renaldo Caroli, MD [facility]  Showing today's visits and meeting all other requirements Future Appointments Date Type Provider Dept  01/18/24 Appointment Renaldo Caroli, MD [facility]  Showing future appointments within next 90 days and meeting all other requirements  I discussed the assessment and treatment plan with the patient. The patient was provided an opportunity to ask questions and all were answered. The patient agreed with the plan and demonstrated an understanding of  the instructions.  Patient advised to call back or seek an in-person evaluation if the symptoms or condition worsens.  Duration of encounter: 30 minutes.  Total time on encounter, as per AMA guidelines included both the face-to-face and non-face-to-face time personally spent by the physician and/or other qualified health care professional(s) on the day of the encounter (includes time in activities that require the physician or other qualified health care professional and does not include time in activities normally performed by clinical staff). Physician's time may include the following activities when performed: Preparing to see the patient (e.g., pre-charting review of records, searching for previously ordered imaging, lab work, and nerve conduction tests) Review of prior analgesic pharmacotherapies. Reviewing PMP Interpreting ordered tests (e.g., lab work, imaging, nerve conduction tests) Performing post-procedure evaluations, including interpretation of diagnostic procedures Obtaining and/or reviewing separately obtained history Performing a medically appropriate examination and/or evaluation Counseling and educating the patient/family/caregiver Ordering medications, tests, or procedures Referring and communicating with other health care professionals (when not separately reported) Documenting clinical information in the electronic or other health record Independently interpreting results (not separately reported) and communicating results to the patient/ family/caregiver Care coordination (not separately reported)  Note by: Candi Chafe, MD Date: 12/21/2023; Time: 2:05 PM

## 2023-12-20 NOTE — Patient Instructions (Signed)

## 2023-12-21 ENCOUNTER — Ambulatory Visit: Payer: PPO | Attending: Pain Medicine | Admitting: Pain Medicine

## 2023-12-21 ENCOUNTER — Encounter: Payer: Self-pay | Admitting: Pain Medicine

## 2023-12-21 VITALS — BP 111/92 | HR 96 | Temp 97.4°F | Resp 16 | Ht 62.0 in | Wt 140.0 lb

## 2023-12-21 DIAGNOSIS — M5135 Other intervertebral disc degeneration, thoracolumbar region: Secondary | ICD-10-CM | POA: Insufficient documentation

## 2023-12-21 DIAGNOSIS — M47816 Spondylosis without myelopathy or radiculopathy, lumbar region: Secondary | ICD-10-CM | POA: Insufficient documentation

## 2023-12-21 DIAGNOSIS — G8929 Other chronic pain: Secondary | ICD-10-CM | POA: Insufficient documentation

## 2023-12-21 DIAGNOSIS — M79605 Pain in left leg: Secondary | ICD-10-CM | POA: Insufficient documentation

## 2023-12-21 DIAGNOSIS — Q7649 Other congenital malformations of spine, not associated with scoliosis: Secondary | ICD-10-CM | POA: Insufficient documentation

## 2023-12-21 DIAGNOSIS — Z79899 Other long term (current) drug therapy: Secondary | ICD-10-CM | POA: Insufficient documentation

## 2023-12-21 DIAGNOSIS — Z79891 Long term (current) use of opiate analgesic: Secondary | ICD-10-CM | POA: Diagnosis not present

## 2023-12-21 DIAGNOSIS — M4316 Spondylolisthesis, lumbar region: Secondary | ICD-10-CM | POA: Diagnosis not present

## 2023-12-21 DIAGNOSIS — M4155 Other secondary scoliosis, thoracolumbar region: Secondary | ICD-10-CM | POA: Diagnosis not present

## 2023-12-21 DIAGNOSIS — M5416 Radiculopathy, lumbar region: Secondary | ICD-10-CM | POA: Insufficient documentation

## 2023-12-21 DIAGNOSIS — M25551 Pain in right hip: Secondary | ICD-10-CM | POA: Insufficient documentation

## 2023-12-21 DIAGNOSIS — G894 Chronic pain syndrome: Secondary | ICD-10-CM | POA: Diagnosis not present

## 2023-12-21 DIAGNOSIS — M545 Low back pain, unspecified: Secondary | ICD-10-CM | POA: Insufficient documentation

## 2023-12-21 DIAGNOSIS — M48062 Spinal stenosis, lumbar region with neurogenic claudication: Secondary | ICD-10-CM | POA: Diagnosis not present

## 2023-12-21 DIAGNOSIS — M4726 Other spondylosis with radiculopathy, lumbar region: Secondary | ICD-10-CM

## 2023-12-21 DIAGNOSIS — M25552 Pain in left hip: Secondary | ICD-10-CM | POA: Insufficient documentation

## 2023-12-21 MED ORDER — OXYCODONE HCL 5 MG PO TABS: 5.0000 mg | ORAL_TABLET | Freq: Every day | ORAL | 0 refills | Status: DC | PRN

## 2023-12-21 NOTE — Progress Notes (Signed)
 Nursing Pain Medication Assessment:  Safety precautions to be maintained throughout the outpatient stay will include: orient to surroundings, keep bed in low position, maintain call bell within reach at all times, provide assistance with transfer out of bed and ambulation.  Medication Inspection Compliance: Ms. Laffitte did not comply with our request to bring her pills to be counted. She was reminded that bringing the medication bottles, even when empty, is a requirement.  Medication: None brought in. Pill/Patch Count: None available to be counted. Bottle Appearance: No container available. Did not bring bottle(s) to appointment. Filled Date: N/A Last Medication intake:   last week.  Patient thinks she threw her pills away.  Can't find them

## 2024-01-17 NOTE — Progress Notes (Unsigned)
 PROVIDER NOTE: Information contained herein reflects review and annotations entered in association with encounter. Interpretation of such information and data should be left to medically-trained personnel. Information provided to patient can be located elsewhere in the medical record under "Patient Instructions". Document created using STT-dictation technology, any transcriptional errors that may result from process are unintentional.    Patient: Cynthia Bennett  Service Category: E/M  Provider: Oswaldo Done, MD  DOB: Mar 20, 1933  DOS: 01/18/2024  Referring Provider: Jerl Mina, MD  MRN: [id number]  Specialty: Interventional Pain Management  PCP: Jerl Mina, MD  Type: Established Patient  Setting: Ambulatory outpatient    Location: Office  Delivery: Face-to-face     HPI  [name], a 89 y.o. year old female, is here today because of her No primary diagnosis found.. Ms. Bourgoin primary complain today is No chief complaint on file.  Pertinent problems: Ms. Depaoli has Fibromyalgia; Chronic pain syndrome; Lumbar radicular pain (Bilateral) (R>L); Chronic low back pain (1ry area of Pain) (Bilateral) (L>R) w/o sciatica; Grade 1 spondylolisthesis of L4 over L5; Lumbar discogenic pain syndrome; Lumbar facet syndrome (Bilateral) (L>R); Lumbar facet hypertrophy (Bilateral); Chronic sacroiliac joint pain (Left); Chronic hip pain (Left); Lumbar spondylosis; Abnormal MRI, lumbar spine (06/25/2016); Cervical spondylosis (Anterolisthesis of C5 over C6); Chronic lower extremity pain (2ry area of Pain) (Left); Chronic lumbar radicular pain (Left) (L5 Dermatome); Osteoarthritis of hip (Left); Chronic sacroiliac joint pain (Bilateral) (R>L); Chronic shoulder pain (Left); Osteoarthritis of shoulder (Left); Chronic sacroiliac joint pain (Right); Chronic hip pain (Right); Left upper arm pain; Myofascial pain syndrome (left upper extremity); Acromioclavicular joint pain (Left); Osteoarthritis of AC (acromioclavicular)  joint (Left); Supraspinatus tendon tear, sequela (Left); Spondylosis without myelopathy or radiculopathy, lumbosacral region; Other specified dorsopathies, sacral and sacrococcygeal region; Bertolotti's syndrome (Left); DDD (degenerative disc disease), lumbar; Severe (5-6 mm) L4-5 lumbar spinal stenosis and (8 mm) L3-4 spinal stenosis.; Osteoarthritis of hip (Right); Trigger point with back pain (Right); Chronic myofascial pain; Chronic low back pain (Right) w/o sciatica; Chronic hip pain (3ry area of Pain) (Bilateral) (R>L); Osteoarthritis of hips (Bilateral); Chronic low back pain (Left) w/o sciatica; Thoracic back pain (Left); DDD (degenerative disc disease), thoracic; DDD (degenerative disc disease), thoracolumbar; Scoliosis of thoracolumbar region due to degenerative disease of spine in adult; Weakness of left leg; Numbness and tingling of left leg; Lumbar facet joint pain; and Pseudoarthrosis of lumbar spine on their pertinent problem list. Pain Assessment: Severity of   is reported as a  /10. Location:    / . Onset:  . Quality:  . Timing:  . Modifying factor(s):  Marland Kitchen Vitals:  vitals were not taken for this visit.  BMI: Estimated body mass index is 25.61 kg/m as calculated from the following:   Height as of 12/21/23: 5\' 2"  (1.575 m).   Weight as of 12/21/23: 140 lb (63.5 kg). Last encounter: 12/21/2023. Last procedure: 09/29/2023.  Reason for encounter: medication management. ***  Discussed the use of AI scribe software for clinical note transcription with the patient, who gave verbal consent to proceed.  History of Present Illness         RTCB: 04/20/2024   Pharmacotherapy Assessment  Analgesic: Oxycodone IR 5 mg, 1 tab PO daily (maximum of 5 mg/day of oxycodone.) MME/day: 7.5 mg/day.   Monitoring: [place] PMP: PDMP reviewed during this encounter.       Pharmacotherapy: No side-effects or adverse reactions reported. Compliance: No problems identified. Effectiveness: Clinically  acceptable.  No notes on file  No results  found for: "CBDTHCR" No results found for: "D8THCCBX" No results found for: "D9THCCBX"  UDS:  Summary  Date Value Ref Range Status  04/22/2023 Note  Final    Comment:    ==================================================================== [employer] Select 13 (MW) ==================================================================== Specimen Alert Not Detected result may be consistent with the time of last use noted for this medication. AS NEEDED (Oxycodone) ==================================================================== Test                             Result       Flag       Units  Drug Absent but Declared for Prescription Verification   Oxycodone                      Not Detected UNEXPECTED ng/mg creat ==================================================================== Test                      Result    Flag   Units      Ref Range   Creatinine              65               mg/dL      >=78 ==================================================================== Declared Medications:  The flagging and interpretation on this report are based on the  following declared medications.  Unexpected results may arise from  inaccuracies in the declared medications.   **Note: The testing scope of this panel includes these medications:   Oxycodone   **Note: The testing scope of this panel does not include the  following reported medications:   Acetaminophen (Tylenol)  Doxepin (Sinequan)  Esomeprazole (Nexium)  Eye Drops  Magnesium (Mag-Ox)  Naloxone (Narcan)  Ondansetron (Zofran)  Sertraline (Zoloft)  Trazodone (Desyrel)  Vitamin D3  Zolpidem (Ambien) ==================================================================== For clinical consultation, please call [phone]. ====================================================================       ROS  Constitutional: Denies any fever or chills Gastrointestinal: No reported  hemesis, hematochezia, vomiting, or acute GI distress Musculoskeletal: Denies any acute onset joint swelling, redness, loss of ROM, or weakness Neurological: No reported episodes of acute onset apraxia, aphasia, dysarthria, agnosia, amnesia, paralysis, loss of coordination, or loss of consciousness  Medication Review  Guaifenesin, Vitamin D3, acetaminophen, chlorhexidine, cyanocobalamin, diclofenac Sodium, dorzolamide-timolol, doxepin, esomeprazole, magnesium oxide, naloxone, ondansetron, oxyCODONE, sertraline, and zolpidem  History Review  Allergy: Ms. Archbold is allergic to ibuprofen. Drug: Ms. Mesler  reports no history of drug use. Alcohol:  reports no history of alcohol use. Tobacco:  reports that she has quit smoking. She has never used smokeless tobacco. Social: Ms. Lybeck  reports that she has quit smoking. She has never used smokeless tobacco. She reports that she does not drink alcohol and does not use drugs. Medical:  has a past medical history of Dizzy spells, Fibromyalgia, Glaucoma, Hiatal hernia, Osteoarthritis, Osteoarthritis of spine with radiculopathy, lumbar region (08/14/2015), Peptic ulcer disease, Reflux, Spinal stenosis, Stroke (HCC), and TIA (transient ischemic attack). Surgical: Ms. Haubner  has a past surgical history that includes Esophagogastroduodenoscopy (N/A, 04/16/2015); Appendectomy; and Abdominal hysterectomy. Family: family history includes COPD in her father; Mental illness in her mother.  Laboratory Chemistry Profile   Renal Lab Results  Component Value Date   BUN 19 10/15/2023   CREATININE 0.94 10/15/2023   BCR 11 (L) 01/10/2019   GFRAA 46 (L) 01/10/2019   GFRNONAA 58 (L) 10/15/2023    Hepatic Lab Results  Component Value Date   AST  17 07/31/2022   ALT 10 07/31/2022   ALBUMIN 3.6 07/31/2022   ALKPHOS 54 07/31/2022   LIPASE 139 04/27/2014    Electrolytes Lab Results  Component Value Date   NA 139 10/15/2023   K 3.9 10/15/2023   CL 107 10/15/2023    CALCIUM 8.9 10/15/2023   MG 2.3 01/10/2019    Bone Lab Results  Component Value Date   25OHVITD1 26 (L) 01/10/2019   25OHVITD2 1.6 01/10/2019   25OHVITD3 24 01/10/2019    Inflammation (CRP: Acute Phase) (ESR: Chronic Phase) Lab Results  Component Value Date   CRP 0.9 08/24/2020   ESRSEDRATE 51 (H) 01/10/2019         Note: Above Lab results reviewed.  Recent Imaging Review  DG PAIN CLINIC C-ARM 1-60 MIN NO REPORT Fluoro was used, but no Radiologist interpretation will be provided.  Please refer to "NOTES" tab for provider progress note. Note: Reviewed        Physical Exam  General appearance: Well nourished, well developed, and well hydrated. In no apparent acute distress Mental status: Alert, oriented x 3 (person, place, & time)       Respiratory: No evidence of acute respiratory distress Eyes: PERLA Vitals: There were no vitals taken for this visit. BMI: Estimated body mass index is 25.61 kg/m as calculated from the following:   Height as of 12/21/23: 5\' 2"  (1.575 m).   Weight as of 12/21/23: 140 lb (63.5 kg). Ideal: Patient weight not recorded  Assessment   Diagnosis Status  1. Chronic low back pain (1ry area of Pain) (Bilateral) (L>R) w/o sciatica   2. Chronic lower extremity pain (2ry area of Pain) (Left)   3. Chronic hip pain (3ry area of Pain) (Bilateral) (R>L)   4. Severe (5-6 mm) L4-5 lumbar spinal stenosis and (8 mm) L3-4 spinal stenosis.   5. Bertolotti's syndrome (Left)   6. Lumbar facet hypertrophy (Bilateral)   7. Grade 1 spondylolisthesis of L4 over L5   8. Chronic lumbar radicular pain (Left) (L5 Dermatome)   9. Scoliosis of thoracolumbar region due to degenerative disease of spine in adult   10. DDD (degenerative disc disease), thoracolumbar   11. Chronic pain syndrome   12. Pharmacologic therapy   13. Chronic use of opiate for therapeutic purpose   14. Encounter for medication management   15. Encounter for chronic pain management     Controlled Controlled Controlled   Updated Problems: No problems updated.  Plan of Care  Problem-specific:  Assessment and Plan            Ms. Marcelle Overlie Quirarte has a current medication list which includes the following long-term medication(s): doxepin, oxycodone, and zolpidem.  Pharmacotherapy (Medications Ordered): No orders of the defined types were placed in this encounter.  Orders:  No orders of the defined types were placed in this encounter.  Follow-up plan:   No follow-ups on file.      Interventional Therapies  Risk Factors  Complex Considerations:  Advanced age   Planned  Pending:      Under consideration:   Palliative left L4-5 and L5-S1 lumbar facet MBB  Possible lumbar facet RFA    Completed:   Diagnostic left L5-S1 pseudoarthrosis inj. #1 (01/09/2022) (10/10 to 0/10)  Therapeutic left L4-5 LESI x8 (06/25/2021) (100/100/80/90)  Therapeutic right L4-5 LESI x1 (07/21/2017) (DNR-F/U)  Palliative right L5-S1 LESI x1 (09/27/2015) (100/100/90)  Therapeutic left L5-S1 LESI x1 (02/05/2016) (100/100/100/100)  Diagnostic right SI joint injection x2 (08/25/2019) (100/100/40/30)  Diagnostic left SI joint injection x2 (01/27/2019) (100/100/100/100)  Diagnostic left glenohumeral joint inj. x1 (04/29/2017) (100/100/98/98)  Left biceps muscle trigger point inj. x1 (07/21/2017) (DNR-F/U)  Right PSIS trigger point inj. x1 (07/27/2018) (100/100/50/>50)  Diagnostic right lumbar facet MBB x3 (01/09/2022) (10/10 to 0/10)  Diagnostic left L4-5 and L5-S1 lumbar facet MBB x5 (09/29/2023) (100/100/100/100)  Diagnostic left IA hip inj. x1 (01/27/2019) (100/100/100/100)    Completed by other providers:   None at this time   Therapeutic  Palliative (PRN) options:   None established     Recent Visits Date Type Provider Dept  12/21/23 Office Visit Delano Metz, MD [facility]  Showing recent visits within past 90 days and meeting all other requirements Future  Appointments Date Type Provider Dept  01/18/24 Appointment Delano Metz, MD [facility]  Showing future appointments within next 90 days and meeting all other requirements  I discussed the assessment and treatment plan with the patient. The patient was provided an opportunity to ask questions and all were answered. The patient agreed with the plan and demonstrated an understanding of the instructions.  Patient advised to call back or seek an in-person evaluation if the symptoms or condition worsens.  Duration of encounter: *** minutes.  Total time on encounter, as per AMA guidelines included both the face-to-face and non-face-to-face time personally spent by the physician and/or other qualified health care professional(s) on the day of the encounter (includes time in activities that require the physician or other qualified health care professional and does not include time in activities normally performed by clinical staff). Physician's time may include the following activities when performed: Preparing to see the patient (e.g., pre-charting review of records, searching for previously ordered imaging, lab work, and nerve conduction tests) Review of prior analgesic pharmacotherapies. Reviewing PMP Interpreting ordered tests (e.g., lab work, imaging, nerve conduction tests) Performing post-procedure evaluations, including interpretation of diagnostic procedures Obtaining and/or reviewing separately obtained history Performing a medically appropriate examination and/or evaluation Counseling and educating the patient/family/caregiver Ordering medications, tests, or procedures Referring and communicating with other health care professionals (when not separately reported) Documenting clinical information in the electronic or other health record Independently interpreting results (not separately reported) and communicating results to the patient/ family/caregiver Care coordination  (not separately reported)  Note by: Oswaldo Done, MD Date: 01/18/2024; Time: 2:57 PM

## 2024-01-17 NOTE — Patient Instructions (Incomplete)
 ______________________________________________________________________    Opioid Pain Medication Update  To: All patients taking opioid pain medications. (I.e.: hydrocodone, hydromorphone, oxycodone, oxymorphone, morphine, codeine, methadone, tapentadol, tramadol, buprenorphine, fentanyl, etc.)  Re: Updated review of side effects and adverse reactions of opioid analgesics, as well as new information about long term effects of this class of medications.  Direct risks of long-term opioid therapy are not limited to opioid addiction and overdose. Potential medical risks include serious fractures, breathing problems during sleep, hyperalgesia, immunosuppression, chronic constipation, bowel obstruction, myocardial infarction, and tooth decay secondary to xerostomia.  Unpredictable adverse effects that can occur even if you take your medication correctly: Cognitive impairment, respiratory depression, and death. Most people think that if they take their medication "correctly", and "as instructed", that they will be safe. Nothing could be farther from the truth. In reality, a significant amount of recorded deaths associated with the use of opioids has occurred in individuals that had taken the medication for a long time, and were taking their medication correctly. The following are examples of how this can happen: Patient taking his/her medication for a long time, as instructed, without any side effects, is given a certain antibiotic or another unrelated medication, which in turn triggers a "Drug-to-drug interaction" leading to disorientation, cognitive impairment, impaired reflexes, respiratory depression or an untoward event leading to serious bodily harm or injury, including death.  Patient taking his/her medication for a long time, as instructed, without any side effects, develops an acute impairment of liver and/or kidney function. This will lead to a rapid inability of the body to breakdown and eliminate  their pain medication, which will result in effects similar to an "overdose", but with the same medicine and dose that they had always taken. This again may lead to disorientation, cognitive impairment, impaired reflexes, respiratory depression or an untoward event leading to serious bodily harm or injury, including death.  A similar problem will occur with patients as they grow older and their liver and kidney function begins to decrease as part of the aging process.  Background information: Historically, the original case for using long-term opioid therapy to treat chronic noncancer pain was based on safety assumptions that subsequent experience has called into question. In 1996, the American Pain Society and the American Academy of Pain Medicine issued a consensus statement supporting long-term opioid therapy. This statement acknowledged the dangers of opioid prescribing but concluded that the risk for addiction was low; respiratory depression induced by opioids was short-lived, occurred mainly in opioid-naive patients, and was antagonized by pain; tolerance was not a common problem; and efforts to control diversion should not constrain opioid prescribing. This has now proven to be wrong. Experience regarding the risks for opioid addiction, misuse, and overdose in community practice has failed to support these assumptions.  According to the Centers for Disease Control and Prevention, fatal overdoses involving opioid analgesics have increased sharply over the past decade. Currently, more than 96,700 people die from drug overdoses every year. Opioids are a factor in 7 out of every 10 overdose deaths. Deaths from drug overdose have surpassed motor vehicle accidents as the leading cause of death for individuals between the ages of 26 and 45.  Clinical data suggest that neuroendocrine dysfunction may be very common in both men and women, potentially causing hypogonadism, erectile dysfunction, infertility,  decreased libido, osteoporosis, and depression. Recent studies linked higher opioid dose to increased opioid-related mortality. Controlled observational studies reported that long-term opioid therapy may be associated with increased risk for cardiovascular events. Subsequent  meta-analysis concluded that the safety of long-term opioid therapy in elderly patients has not been proven.   Side Effects and adverse reactions: Common side effects: Drowsiness (sedation). Dizziness. Nausea and vomiting. Constipation. Physical dependence -- Dependence often manifests with withdrawal symptoms when opioids are discontinued or decreased. Tolerance -- As you take repeated doses of opioids, you require increased medication to experience the same effect of pain relief. Respiratory depression -- This can occur in healthy people, especially with higher doses. However, people with COPD, asthma or other lung conditions may be even more susceptible to fatal respiratory impairment.  Uncommon side effects: An increased sensitivity to feeling pain and extreme response to pain (hyperalgesia). Chronic use of opioids can lead to this. Delayed gastric emptying (the process by which the contents of your stomach are moved into your small intestine). Muscle rigidity. Immune system and hormonal dysfunction. Quick, involuntary muscle jerks (myoclonus). Arrhythmia. Itchy skin (pruritus). Dry mouth (xerostomia).  Long-term side effects: Chronic constipation. Sleep-disordered breathing (SDB). Increased risk of bone fractures. Hypothalamic-pituitary-adrenal dysregulation. Increased risk of overdose.  RISKS: Respiratory depression and death: Opioids increase the risk of respiratory depression and death.  Drug-to-drug interactions: Opioids are relatively contraindicated in combination with benzodiazepines, sleep inducers, and other central nervous system depressants. Other classes of medications (i.e.: certain antibiotics  and even over-the-counter medications) may also trigger or induce respiratory depression in some patients.  Medical conditions: Patients with pre-existing respiratory problems are at higher risk of respiratory failure and/or depression when in combination with opioid analgesics. Opioids are relatively contraindicated in some medical conditions such as central sleep apnea.   Fractures and Falls:  Opioids increase the risk and incidence of falls. This is of particular importance in elderly patients.  Endocrine System:  Long-term administration is associated with endocrine abnormalities (endocrinopathies). (Also known as Opioid-induced Endocrinopathy) Influences on both the hypothalamic-pituitary-adrenal axis?and the hypothalamic-pituitary-gonadal axis have been demonstrated with consequent hypogonadism and adrenal insufficiency in both sexes. Hypogonadism and decreased levels of dehydroepiandrosterone sulfate have been reported in men and women. Endocrine effects include: Amenorrhoea in women (abnormal absence of menstruation) Reduced libido in both sexes Decreased sexual function Erectile dysfunction in men Hypogonadisms (decreased testicular function with shrinkage of testicles) Infertility Depression and fatigue Loss of muscle mass Anxiety Depression Immune suppression Hyperalgesia Weight gain Anemia Osteoporosis Patients (particularly women of childbearing age) should avoid opioids. There is insufficient evidence to recommend routine monitoring of asymptomatic patients taking opioids in the long-term for hormonal deficiencies.  Immune System: Human studies have demonstrated that opioids have an immunomodulating effect. These effects are mediated via opioid receptors both on immune effector cells and in the central nervous system. Opioids have been demonstrated to have adverse effects on antimicrobial response and anti-tumour surveillance. Buprenorphine has been demonstrated to have  no impact on immune function.  Opioid Induced Hyperalgesia: Human studies have demonstrated that prolonged use of opioids can lead to a state of abnormal pain sensitivity, sometimes called opioid induced hyperalgesia (OIH). Opioid induced hyperalgesia is not usually seen in the absence of tolerance to opioid analgesia. Clinically, hyperalgesia may be diagnosed if the patient on long-term opioid therapy presents with increased pain. This might be qualitatively and anatomically distinct from pain related to disease progression or to breakthrough pain resulting from development of opioid tolerance. Pain associated with hyperalgesia tends to be more diffuse than the pre-existing pain and less defined in quality. Management of opioid induced hyperalgesia requires opioid dose reduction.  Cancer: Chronic opioid therapy has been associated with an increased risk  of cancer among noncancer patients with chronic pain. This association was more evident in chronic strong opioid users. Chronic opioid consumption causes significant pathological changes in the small intestine and colon. Epidemiological studies have found that there is a link between opium dependence and initiation of gastrointestinal cancers. Cancer is the second leading cause of death after cardiovascular disease. Chronic use of opioids can cause multiple conditions such as GERD, immunosuppression and renal damage as well as carcinogenic effects, which are associated with the incidence of cancers.   Mortality: Long-term opioid use has been associated with increased mortality among patients with chronic non-cancer pain (CNCP).  Prescription of long-acting opioids for chronic noncancer pain was associated with a significantly increased risk of all-cause mortality, including deaths from causes other than overdose.  Reference: Von Korff M, Kolodny A, Deyo RA, Chou R. Long-term opioid therapy reconsidered. Ann Intern Med. 2011 Sep 6;155(5):325-8. doi:  10.7326/[id number]. PMID: 16109604; PMCID: [id number]. Randon Goldsmith, Hayward RA, Dunn KM, [place] KP. Risk of adverse events in patients prescribed long-term opioids: A cohort study in the [place] [facility] Research Datalink. Eur J Pain. 2019 May;23(5):908-922. doi: 10.1002/ejp.1357. Epub 2019 Jan 31. PMID: 78295621. Colameco S, Coren JS, Ciervo CA. Continuous opioid treatment for chronic noncancer pain: a time for moderation in prescribing. Postgrad Med. 2009 Jul;121(4):61-6. doi: 10.3810/pgm.2009.07.2032. PMID: 30865784. William Hamburger RN, Gardner SD, Blazina I, Cristopher Peru, Bougatsos C, Deyo RA. The effectiveness and risks of long-term opioid therapy for chronic pain: a systematic review for a [employer] of [employer] Pathways to [employer]. Ann Intern Med. 2015 Feb 17;162(4):276-86. doi: 10.7326/M14-2559. PMID: 69629528. Caryl Bis [facility], Makuc DM. NCHS Data Brief No. 22. [place]: Centers for Disease Control and Prevention; 2009. Sep, Increase in Fatal Poisonings Involving Opioid Analgesics in the [place], 1999-2006. Song IA, Choi HR, Oh TK. Long-term opioid use and mortality in patients with chronic non-cancer pain: Ten-year follow-up study in [place] from 2010 through 2019. EClinicalMedicine. 2022 Jul 18;51:101558. doi: 10.1016/j.eclinm.2022.413244. PMID: 01027253; PMCID: [id number]. Huser, W., Schubert, T., Vogelmann, T. et al. All-cause mortality in patients with long-term opioid therapy compared with non-opioid analgesics for chronic non-cancer pain: a database study. BMC Med 18, 162 (2020). http://lester.info/ Rashidian H, Karie Kirks, Malekzadeh R, Haghdoost AA. An Ecological Study of the Association between Opiate Use and Incidence of Cancers. Addict Health. 2016 Fall;8(4):252-260. PMID: 59563875; PMCID: [id number].  Our Goal: Our goal is to control your pain with means other  than the use of opioid pain medications.  Our Recommendation: Talk to your physician about coming off of these medications. We can assist you with the tapering down and stopping these medicines. Based on the new information, even if you cannot completely stop the medication, a decrease in the dose may be associated with a lesser risk. Ask for other means of controlling the pain. Decrease or eliminate those factors that significantly contribute to your pain such as smoking, obesity, and a diet heavily tilted towards "inflammatory" nutrients.  Last Updated: 05/18/2023   ______________________________________________________________________       ______________________________________________________________________    National Pain Medication Shortage  The [place] is experiencing worsening drug shortages. These have had a negative widespread effect on patient care and treatment. Not expected to improve any time soon. Predicted to last past 2029.   Drug shortage list (generic names) Oxycodone IR Oxycodone/APAP Oxymorphone IR Hydromorphone Hydrocodone/APAP Morphine  Where is the problem?  Manufacturing and supply level.  Will this shortage  affect you?  Only if you take any of the above pain medications.  How? You may be unable to fill your prescription.  Your pharmacist may offer a "partial fill" of your prescription. (Warning: Do not accept partial fills.) Prescriptions partially filled cannot be transferred to another pharmacy. Read our Medication Rules and Regulation. Depending on how much medicine you are dependent on, you may experience withdrawals when unable to get the medication.  Recommendations: Consider ending your dependence on opioid pain medications. Ask your pain specialist to assist you with the process. Consider switching to a medication currently not in shortage, such as Buprenorphine. Talk to your pain specialist about this option. Consider decreasing your pain  medication requirements by managing tolerance thru "Drug Holidays". This may help minimize withdrawals, should you run out of medicine. Control your pain thru the use of non-pharmacological interventional therapies.   Your prescriber: Prescribers cannot be blamed for shortages. Medication manufacturing and supply issues cannot be fixed by the prescriber.   NOTE: The prescriber is not responsible for supplying the medication, or solving supply issues. Work with your pharmacist to solve it. The patient is responsible for the decision to take or continue taking the medication and for identifying and securing a legal supply source. By law, supplying the medication is the job and responsibility of the pharmacy. The prescriber is responsible for the evaluation, monitoring, and prescribing of these medications.   Prescribers will NOT: Re-issue prescriptions that have been partially filled. Re-issue prescriptions already sent to a pharmacy.  Re-send prescriptions to a different pharmacy because yours did not have your medication. Ask pharmacist to order more medicine or transfer the prescription to another pharmacy. (Read below.)  New 2023 regulation: "July 11, 2022 Revised Regulation Allows DEA-Registered Pharmacies to Transfer Electronic Prescriptions at a Patient's Request DEA Headquarters Division - Public Information Office Patients now have the ability to request their electronic prescription be transferred to another pharmacy without having to go back to their practitioner to initiate the request. This revised regulation went into effect on Monday, July 07, 2022.     At a patient's request, a DEA-registered retail pharmacy can now transfer an electronic prescription for a controlled substance (schedules II-V) to another DEA-registered retail pharmacy. Prior to this change, patients would have to go through their practitioner to cancel their prescription and have it re-issued to a different  pharmacy. The process was taxing and time consuming for both patients and practitioners.    The Drug Enforcement Administration [facility]) published its intent to revise the process for transferring electronic prescriptions on September 28, 2020.  The final rule was published in the federal register on June 05, 2022 and went into effect 30 days later.  Under the final rule, a prescription can only be transferred once between pharmacies, and only if allowed under existing state or other applicable law. The prescription must remain in its electronic form; may not be altered in any way; and the transfer must be communicated directly between two licensed pharmacists. It's important to note, any authorized refills transfer with the original prescription, which means the entire prescription will be filled at the same pharmacy".  Reference: [email or web address] [facility] website announcement)  [email or web address] [employer])   [employer] / Vol. 88, No. 143 / Thursday, June 05, 2022 / Rules and Regulations DEPARTMENT OF JUSTICE  Drug Enforcement Administration  21 CFR Part 1306  [Docket No. [id number]]  RIN [id number] Transfer of Electronic Prescriptions for Schedules II-V Controlled Substances Between  Pharmacies for Initial Filling  ______________________________________________________________________       ______________________________________________________________________    Transfer of Pain Medication between Pharmacies  Re: 2023 DEA Clarification on existing regulation  Published on DEA Website: July 11, 2022  Title: Revised Regulation Allows DEA-Registered Pharmacies to Electrical engineer Prescriptions at a Patient's Request DEA Headquarters Division - [employer]  "Patients now have the ability to  request their electronic prescription be transferred to another pharmacy without having to go back to their practitioner to initiate the request. This revised regulation went into effect on Monday, July 07, 2022.     At a patient's request, a DEA-registered retail pharmacy can now transfer an electronic prescription for a controlled substance (schedules II-V) to another DEA-registered retail pharmacy. Prior to this change, patients would have to go through their practitioner to cancel their prescription and have it re-issued to a different pharmacy. The process was taxing and time consuming for both patients and practitioners.    The Drug Enforcement Administration [facility]) published its intent to revise the process for transferring electronic prescriptions on September 28, 2020.  The final rule was published in the federal register on June 05, 2022 and went into effect 30 days later.  Under the final rule, a prescription can only be transferred once between pharmacies, and only if allowed under existing state or other applicable law. The prescription must remain in its electronic form; may not be altered in any way; and the transfer must be communicated directly between two licensed pharmacists. It's important to note, any authorized refills transfer with the original prescription, which means the entire prescription will be filled at the same pharmacy."    REFERENCES: 1. DEA website announcement [email or web address]  2. Department of Justice website  [email or web address]  3. DEPARTMENT OF JUSTICE Drug Enforcement Administration 21 CFR Part 1306 [Docket No. [id number]] RIN [id number] "Transfer of Electronic Prescriptions for Schedules II-V Controlled Substances Between Pharmacies for Initial  Filling"  ______________________________________________________________________       ______________________________________________________________________    Medication Rules  Purpose: To inform patients, and their family members, of our medication rules and regulations.  Applies to: All patients receiving prescriptions from our practice (written or electronic).  Pharmacy of record: This is the pharmacy where your electronic prescriptions will be sent. Make sure we have the correct one.  Electronic prescriptions: In compliance with the [facility] Strengthen Opioid Misuse Prevention (STOP) Act of 2017 (Session Conni Elliot [phone]), effective November 10, 2018, all controlled substances must be electronically prescribed. Written prescriptions, faxing, or calling prescriptions to a pharmacy will no longer be done.  Prescription refills: These will be provided only during in-person appointments. No medications will be renewed without a "face-to-face" evaluation with your provider. Applies to all prescriptions.  NOTE: The following applies primarily to controlled substances (Opioid* Pain Medications).   Type of encounter (visit): For patients receiving controlled substances, face-to-face visits are required. (Not an option and not up to the patient.)  Patient's Responsibilities: Pain Pills: Bring all pain pills to every appointment (except for procedure appointments). Pill counts are required.  Pill Bottles: Bring pills in original pharmacy bottle. Bring bottle, even if empty. Always bring the bottle of the most recent fill.  Medication refills: You are responsible for knowing and keeping track of what medications you are taking and when is it that you will need a refill. The day before your appointment: write a list of all prescriptions that need to be refilled. The day of the appointment: give the list to  the admitting nurse. Prescriptions will be written only during appointments. No  prescriptions will be written on procedure days. If you forget a medication: it will not be "Called in", "Faxed", or "electronically sent". You will need to get another appointment to get these prescribed. No early refills. Do not call asking to have your prescription filled early. Partial  or short prescriptions: Occasionally your pharmacy may not have enough pills to fill your prescription.  NEVER ACCEPT a partial fill or a prescription that is short of the total amount of pills that you were prescribed.  With controlled substances the law allows 72 hours for the pharmacy to complete the prescription.  If the prescription is not completed within 72 hours, the pharmacist will require a new prescription to be written. This means that you will be short on your medicine and we WILL NOT send another prescription to complete your original prescription.  Instead, request the pharmacy to send a carrier to a nearby branch to get enough medication to provide you with your full prescription. Prescription Accuracy: You are responsible for carefully inspecting your prescriptions before leaving our office. Have the discharge nurse carefully go over each prescription with you, before taking them home. Make sure that your name is accurately spelled, that your address is correct. Check the name and dose of your medication to make sure it is accurate. Check the number of pills, and the written instructions to make sure they are clear and accurate. Make sure that you are given enough medication to last until your next medication refill appointment. Taking Medication: Take medication as prescribed. When it comes to controlled substances, taking less pills or less frequently than prescribed is permitted and encouraged. Never take more pills than instructed. Never take the medication more frequently than prescribed.  Inform other Doctors: Always inform, all of your healthcare providers, of all the medications you take. Pain  Medication from other Providers: You are not allowed to accept any additional pain medication from any other Doctor or Healthcare provider. There are two exceptions to this rule. (see below) In the event that you require additional pain medication, you are responsible for notifying us, as stated below. Cough Medicine: Often these contain an opioid, such as codeine or hydrocodone. Never accept or take cough medicine containing these opioids if you are already taking an opioid* medication. The combination may cause respiratory failure and death. Medication Agreement: You are responsible for carefully reading and following our Medication Agreement. This must be signed before receiving any prescriptions from our practice. Safely store a copy of your signed Agreement. Violations to the Agreement will result in no further prescriptions. (Additional copies of our Medication Agreement are available upon request.) Laws, Rules, & Regulations: All patients are expected to follow all [address] and [employer], Rules, [employer]. Ignorance of the Laws does not constitute a valid excuse.  Illegal drugs and Controlled Substances: The use of illegal substances (including, but not limited to marijuana and its derivatives) and/or the illegal use of any controlled substances is strictly prohibited. Violation of this rule may result in the immediate and permanent discontinuation of any and all prescriptions being written by our practice. The use of any illegal substances is prohibited. Adopted CDC guidelines & recommendations: Target dosing levels will be at or below 60 MME/day. Use of benzodiazepines** is not recommended. Urine Drug testing: Patients taking controlled substances will be required to provide a urine sample upon request. Do not void before coming to your medication management appointments.  Hold emptying your bladder until you are admitted. The admitting nurse will inform you if a sample is required. Our  practice reserves the right to call you at any time to provide a sample. Once receiving the call, you have 24 hours to comply with request. Not providing a sample upon request may result in termination of medication therapy.  Exceptions: There are only two exceptions to the rule of not receiving pain medications from other Healthcare Providers. Exception #1 (Emergencies): In the event of an emergency (i.e.: accident requiring emergency care), you are allowed to receive additional pain medication. However, you are responsible for: As soon as you are able, call our office [phone], at any time of the day or night, and leave a message stating your name, the date and nature of the emergency, and the name and dose of the medication prescribed. In the event that your call is answered by a member of our staff, make sure to document and save the date, time, and the name of the person that took your information.  Exception #2 (Planned Surgery): In the event that you are scheduled by another doctor or dentist to have any type of surgery or procedure, you are allowed (for a period no longer than 30 days), to receive additional pain medication, for the acute post-op pain. However, in this case, you are responsible for picking up a copy of our "Post-op Pain Management for Surgeons" handout, and giving it to your surgeon or dentist. This document is available at our office, and does not require an appointment to obtain it. Simply go to our office during business hours (Monday-Thursday from 8:00 AM to 4:00 PM) (Friday 8:00 AM to 12:00 Noon) or if you have a scheduled appointment with [place], prior to your surgery, and ask for it by name. In addition, you are responsible for: calling our office [phone], at any time of the day or night, and leaving a message stating your name, name of your surgeon, type of surgery, and date of procedure or surgery. Failure to comply with your responsibilities may result in termination  of therapy involving the controlled substances.  Consequences:  Non-compliance with the above rules may result in permanent discontinuation of medication prescription therapy. All patients receiving any type of controlled substance is expected to comply with the above patient responsibilities. Not doing so may result in permanent discontinuation of medication prescription therapy. Medication Agreement Violation. Following the above rules, including your responsibilities will help you in avoiding a Medication Agreement Violation ("Breaking your Pain Medication Contract").  *Opioid medications include: morphine, codeine, oxycodone, oxymorphone, hydrocodone, hydromorphone, meperidine, tramadol, tapentadol, buprenorphine, fentanyl, methadone. **Benzodiazepine medications include: diazepam (Valium), alprazolam (Xanax), clonazepam (Klonopine), lorazepam (Ativan), clorazepate (Tranxene), chlordiazepoxide (Librium), estazolam (Prosom), oxazepam (Serax), temazepam (Restoril), triazolam (Halcion) (Last updated: 09/02/2023) ______________________________________________________________________      ______________________________________________________________________    Medication Recommendations and Reminders  Applies to: All patients receiving prescriptions (written and/or electronic).  Medication Rules & Regulations: You are responsible for reading, knowing, and following our "Medication Rules" document. These exist for your safety and that of others. They are not flexible and neither are we. Dismissing or ignoring them is an act of "non-compliance" that may result in complete and irreversible termination of such medication therapy. For safety reasons, "non-compliance" will not be tolerated. As with the [place] fundamental legal principle of "ignorance of the law is no defense", we will accept no excuses for not having read and knowing the content of documents provided to you by  our practice.  Pharmacy of  record:  Definition: This is the pharmacy where your electronic prescriptions will be sent.  We do not endorse any particular pharmacy. It is up to you and your insurance to decide what pharmacy to use.  We do not restrict you in your choice of pharmacy. However, once we write for your prescriptions, we will NOT be re-sending more prescriptions to fix restricted supply problems created by your pharmacy, or your insurance.  The pharmacy listed in the electronic medical record should be the one where you want electronic prescriptions to be sent. If you choose to change pharmacy, simply notify our nursing staff. Changes will be made only during your regular appointments and not over the phone.  Recommendations: Keep all of your pain medications in a safe place, under lock and key, even if you live alone. We will NOT replace lost, stolen, or damaged medication. We do not accept "Police Reports" as proof of medications having been stolen. After you fill your prescription, take 1 week's worth of pills and put them away in a safe place. You should keep a separate, properly labeled bottle for this purpose. The remainder should be kept in the original bottle. Use this as your primary supply, until it runs out. Once it's gone, then you know that you have 1 week's worth of medicine, and it is time to come in for a prescription refill. If you do this correctly, it is unlikely that you will ever run out of medicine. To make sure that the above recommendation works, it is very important that you make sure your medication refill appointments are scheduled at least 1 week before you run out of medicine. To do this in an effective manner, make sure that you do not leave the office without scheduling your next medication management appointment. Always ask the nursing staff to show you in your prescription , when your medication will be running out. Then arrange for the receptionist to get you a return appointment, at least  7 days before you run out of medicine. Do not wait until you have 1 or 2 pills left, to come in. This is very poor planning and does not take into consideration that we may need to cancel appointments due to bad weather, sickness, or emergencies affecting our staff. DO NOT ACCEPT A "Partial Fill": If for any reason your pharmacy does not have enough pills/tablets to completely fill or refill your prescription, do not allow for a "partial fill". The law allows the pharmacy to complete that prescription within 72 hours, without requiring a new prescription. If they do not fill the rest of your prescription within those 72 hours, you will need a separate prescription to fill the remaining amount, which we will NOT provide. If the reason for the partial fill is your insurance, you will need to talk to the pharmacist about payment alternatives for the remaining tablets, but again, DO NOT ACCEPT A PARTIAL FILL, unless you can trust your pharmacist to obtain the remainder of the pills within 72 hours.  Prescription refills and/or changes in medication(s):  Prescription refills, and/or changes in dose or medication, will be conducted only during scheduled medication management appointments. (Applies to both, written and electronic prescriptions.) No refills on procedure days. No medication will be changed or started on procedure days. No changes, adjustments, and/or refills will be conducted on a procedure day. Doing so will interfere with the diagnostic portion of the procedure. No phone refills. No medications will be "  called into the pharmacy". No Fax refills. No weekend refills. No Holliday refills. No after hours refills.  Remember:  Business hours are:  Monday to Thursday 8:00 AM to 4:00 PM Provider's Schedule: Delano Metz, MD - Appointments are:  Medication management: Monday and Wednesday 8:00 AM to 4:00 PM Procedure day: Tuesday and Thursday 7:30 AM to 4:00 PM Edward Jolly, MD -  Appointments are:  Medication management: Tuesday and Thursday 8:00 AM to 4:00 PM Procedure day: Monday and Wednesday 7:30 AM to 4:00 PM (Last update: 09/02/2022) ______________________________________________________________________      ______________________________________________________________________    Drug Holidays  What is a "Drug Holiday"? Drug Holiday: is the name given to the process of slowly tapering down and temporarily stopping the pain medication for the purpose of decreasing or eliminating tolerance to the drug.  Benefits Improved effectiveness Decreased required effective dose Improved pain control End dependence on high dose therapy Decrease cost of therapy Uncovering "opioid-induced hyperalgesia". (OIH)  What is "opioid hyperalgesia"? It is a paradoxical increase in pain caused by exposure to opioids. Stopping the opioid pain medication, contrary to the expected, it actually decreases or completely eliminates the pain. Ref.: "A comprehensive review of opioid-induced hyperalgesia". Donney Rankins, et.al. Pain Physician. 2011 Mar-Apr;14(2):145-61.  What is tolerance? Tolerance: the progressive loss of effectiveness of a pain medicine due to repetitive use. A common problem of opioid pain medications.  How long should a "Drug Holiday" last? Effectiveness depends on the patient staying off all opioid pain medicines for a minimum of 14 consecutive days. (2 weeks) During this time the patient should not be taking any other opioid analgesic medication.  How about just taking less of the medicine? Does not work. Will not accomplish goal of eliminating the excess receptors.  How about switching to a different pain medicine? (AKA. "Opioid rotation") Does not work. Creates the illusion of effectiveness by taking advantage of inaccurate equivalent dose calculations between different opioids. -This "technique" was promoted by studies funded by [employer], such  as [employer], creators of "OxyContin".  Can I stop the medicine "cold [place]"? We do not recommend it. You should always coordinate with your prescribing physician to make the transition as smoothly as possible. Avoid stopping the medicine abruptly without consulting. We recommend a "slow taper".  What is a slow taper? Taper: refers to the gradual decrease in dose.   How do I stop/taper the dose? Slowly. Decrease the daily amount of pills that you take by one (1) pill every seven (7) days. This is called a "slow downward taper". Example: if you normally take four (4) pills per day, drop it to three (3) pills per day for seven (7) days, then to two (2) pills per day for seven (7) days, then to one (1) per day for seven (7) days, and then stop the medicine. The 14 day "Drug Holiday" starts on the first day without medicine.   Will I experience withdrawals? Unlikely with a slow taper.  What triggers withdrawals? Withdrawals are triggered by the sudden/abrupt stop of high dose opioids. Withdrawals can be avoided by slowly decreasing the dose over a prolonged period of time.  What are withdrawals? Symptoms associated with sudden/abrupt reduction/stopping of high-dose, long-term use of pain medication. Withdrawal are seldom seen on low dose therapy, or patients rarely taking opioid medication.  Early Withdrawal Symptoms may include: Agitation Anxiety Muscle aches Increased tearing Insomnia Runny nose Sweating Yawning  Late symptoms may include: Abdominal cramping Diarrhea Dilated pupils Goose bumps Nausea Vomiting  When could I see withdrawals? Onset: 8-24 hours after last use for most opioids. 12-48 hours for long-acting opioids (i.e.: methadone)  How long could they last? Duration: 4-10 days for most opioids. 14-21 days for long-acting opioids (i.e.: methadone)  What will happen after I complete my "Drug Holiday"? The need and indications for the opioid analgesic will be  reviewed before restarting the medication. Dose requirements will likely decrease and the dose will need to be adjusted accordingly.   (Last update: 09/02/2023) ______________________________________________________________________      ______________________________________________________________________     Naloxone Nasal Spray  Why am I receiving this medication? [place] STOP ACT requires that all patients taking high dose opioids or at risk of opioids respiratory depression, be prescribed an opioid reversal agent, such as Naloxone (AKA: Narcan).  What is this medication? NALOXONE (nal OX one) treats opioid overdose, which causes slow or shallow breathing, severe drowsiness, or trouble staying awake. Call emergency services after using this medication. You may need additional treatment. Naloxone works by reversing the effects of opioids. It belongs to a group of medications called opioid blockers.  COMMON BRAND NAME(S): Kloxxado, Narcan  What should I tell my care team before I take this medication? They need to know if you have any of these conditions: Heart disease Substance use disorder An unusual or allergic reaction to naloxone, other medications, foods, dyes, or preservatives Pregnant or trying to get pregnant Breast-feeding  When to use this medication? This medication is to be used for the treatment of respiratory depression (less than 8 breaths per minute) secondary to opioid overdose.   How to use this medication? This medication is for use in the nose. Lay the person on their back. Support their neck with your hand and allow the head to tilt back before giving the medication. The nasal spray should be given into 1 nostril. After giving the medication, move the person onto their side. Do not remove or test the nasal spray until ready to use. Get emergency medical help right away after giving the first dose of this medication, even if the person wakes up. You  should be familiar with how to recognize the signs and symptoms of a narcotic overdose. If more doses are needed, give the additional dose in the other nostril. Talk to your care team about the use of this medication in children. While this medication may be prescribed for children as young as newborns for selected conditions, precautions do apply.  Naloxone Overdosage: If you think you have taken too much of this medicine contact a poison control center or emergency room at once.  NOTE: This medicine is only for you. Do not share this medicine with others.  What if I miss a dose? This does not apply.  What may interact with this medication? This is only used during an emergency. No interactions are expected during emergency use. This list may not describe all possible interactions. Give your health care provider a list of all the medicines, herbs, non-prescription drugs, or dietary supplements you use. Also tell them if you smoke, drink alcohol, or use illegal drugs. Some items may interact with your medicine.  What should I watch for while using this medication? Keep this medication ready for use in the case of an opioid overdose. Make sure that you have the phone number of your care team and local hospital ready. You may need to have additional doses of this medication. Each nasal spray contains a single dose. Some emergencies may require  additional doses. After use, bring the treated person to the nearest hospital or call 911. Make sure the treating care team knows that the person has received a dose of this medication. You will receive additional instructions on what to do during and after use of this medication before an emergency occurs.  What side effects may I notice from receiving this medication? Side effects that you should report to your care team as soon as possible: Allergic reactions--skin rash, itching, hives, swelling of the face, lips, tongue, or throat Side effects that  usually do not require medical attention (report these to your care team if they continue or are bothersome): Constipation Dryness or irritation inside the nose Headache Increase in blood pressure Muscle spasms Stuffy nose Toothache This list may not describe all possible side effects. Call your doctor for medical advice about side effects. You may report side effects to FDA at 1-800-FDA-1088.  Where should I keep my medication? Because this is an emergency medication, you should keep it with you at all times.  Keep out of the reach of children and pets. Store between 20 and 25 degrees C (68 and 77 degrees F). Do not freeze. Throw away any unused medication after the expiration date. Keep in original box until ready to use.  NOTE: This sheet is a summary. It may not cover all possible information. If you have questions about this medicine, talk to your doctor, pharmacist, or health care provider.   2023 Elsevier/Gold Standard (2021-07-05 00:00:00)  ______________________________________________________________________

## 2024-01-18 ENCOUNTER — Encounter: Payer: Self-pay | Admitting: Pain Medicine

## 2024-01-18 ENCOUNTER — Ambulatory Visit: Payer: PPO | Attending: Pain Medicine | Admitting: Pain Medicine

## 2024-01-18 VITALS — BP 123/60 | HR 87 | Temp 97.4°F | Resp 18 | Ht 62.0 in | Wt 150.9 lb

## 2024-01-18 DIAGNOSIS — Z79899 Other long term (current) drug therapy: Secondary | ICD-10-CM

## 2024-01-18 DIAGNOSIS — M5135 Other intervertebral disc degeneration, thoracolumbar region: Secondary | ICD-10-CM | POA: Diagnosis not present

## 2024-01-18 DIAGNOSIS — M4726 Other spondylosis with radiculopathy, lumbar region: Secondary | ICD-10-CM

## 2024-01-18 DIAGNOSIS — M48062 Spinal stenosis, lumbar region with neurogenic claudication: Secondary | ICD-10-CM | POA: Diagnosis not present

## 2024-01-18 DIAGNOSIS — M25551 Pain in right hip: Secondary | ICD-10-CM | POA: Diagnosis not present

## 2024-01-18 DIAGNOSIS — M79605 Pain in left leg: Secondary | ICD-10-CM | POA: Diagnosis not present

## 2024-01-18 DIAGNOSIS — M545 Low back pain, unspecified: Secondary | ICD-10-CM | POA: Insufficient documentation

## 2024-01-18 DIAGNOSIS — M25552 Pain in left hip: Secondary | ICD-10-CM | POA: Diagnosis not present

## 2024-01-18 DIAGNOSIS — M4155 Other secondary scoliosis, thoracolumbar region: Secondary | ICD-10-CM | POA: Diagnosis not present

## 2024-01-18 DIAGNOSIS — M4316 Spondylolisthesis, lumbar region: Secondary | ICD-10-CM

## 2024-01-18 DIAGNOSIS — Q7649 Other congenital malformations of spine, not associated with scoliosis: Secondary | ICD-10-CM | POA: Diagnosis not present

## 2024-01-18 DIAGNOSIS — M5416 Radiculopathy, lumbar region: Secondary | ICD-10-CM | POA: Insufficient documentation

## 2024-01-18 DIAGNOSIS — Z79891 Long term (current) use of opiate analgesic: Secondary | ICD-10-CM

## 2024-01-18 DIAGNOSIS — G8929 Other chronic pain: Secondary | ICD-10-CM | POA: Diagnosis not present

## 2024-01-18 DIAGNOSIS — M47816 Spondylosis without myelopathy or radiculopathy, lumbar region: Secondary | ICD-10-CM | POA: Diagnosis not present

## 2024-01-18 DIAGNOSIS — G894 Chronic pain syndrome: Secondary | ICD-10-CM

## 2024-01-18 MED ORDER — OXYCODONE HCL 5 MG PO TABS: 5.0000 mg | ORAL_TABLET | Freq: Every day | ORAL | 0 refills | Status: DC | PRN

## 2024-01-18 NOTE — Progress Notes (Signed)
 Nursing Pain Medication Assessment:  Safety precautions to be maintained throughout the outpatient stay will include: orient to surroundings, keep bed in low position, maintain call bell within reach at all times, provide assistance with transfer out of bed and ambulation.  Medication Inspection Compliance: Pill count conducted under aseptic conditions, in front of the patient. Neither the pills nor the bottle was removed from the patient's sight at any time. Once count was completed pills were immediately returned to the patient in their original bottle.  Medication: Oxycodone IR Pill/Patch Count:  12 of 30 pills remain Pill/Patch Appearance: Markings consistent with prescribed medication Bottle Appearance: Standard pharmacy container. Clearly labeled. Filled Date: 02 / 11 / 2025 Last Medication intake:  Today

## 2024-02-02 ENCOUNTER — Ambulatory Visit: Admission: RE | Admit: 2024-02-02 | Source: Ambulatory Visit

## 2024-02-02 ENCOUNTER — Encounter: Payer: Self-pay | Admitting: Pain Medicine

## 2024-02-02 ENCOUNTER — Ambulatory Visit: Attending: Pain Medicine | Admitting: Pain Medicine

## 2024-02-02 VITALS — BP 153/78 | HR 76 | Temp 97.2°F | Resp 16 | Ht 62.0 in | Wt 145.0 lb

## 2024-02-02 DIAGNOSIS — M545 Low back pain, unspecified: Secondary | ICD-10-CM | POA: Diagnosis not present

## 2024-02-02 DIAGNOSIS — Z5189 Encounter for other specified aftercare: Secondary | ICD-10-CM | POA: Diagnosis not present

## 2024-02-02 DIAGNOSIS — G8929 Other chronic pain: Secondary | ICD-10-CM

## 2024-02-02 DIAGNOSIS — M51361 Other intervertebral disc degeneration, lumbar region with lower extremity pain only: Secondary | ICD-10-CM | POA: Insufficient documentation

## 2024-02-02 MED ORDER — TRIAMCINOLONE ACETONIDE 40 MG/ML IJ SUSP: 40.0000 mg | Freq: Once | INTRAMUSCULAR | Status: DC

## 2024-02-02 MED ORDER — ROPIVACAINE HCL 2 MG/ML IJ SOLN: 2.0000 mL | Freq: Once | INTRAMUSCULAR | Status: DC

## 2024-02-02 MED ORDER — SODIUM CHLORIDE 0.9% FLUSH: 2.0000 mL | Freq: Once | INTRAVENOUS | Status: DC

## 2024-02-02 MED ORDER — IOHEXOL 180 MG/ML  SOLN: 10.0000 mL | Freq: Once | INTRAMUSCULAR | Status: DC

## 2024-02-02 MED ORDER — MIDAZOLAM HCL 2 MG/2ML IJ SOLN: 0.5000 mg | Freq: Once | INTRAMUSCULAR | Status: DC

## 2024-02-02 NOTE — Patient Instructions (Signed)

## 2024-02-02 NOTE — Progress Notes (Signed)
(  02/02/2024) the patient came into the clinic today to have her procedure done, but due to some delays we were not able to get to her on a timely manner and she decided to reschedule.  She also indicated that she was also feeling somewhat off as if he was getting the flu.

## 2024-02-02 NOTE — Progress Notes (Signed)
 Safety precautions to be maintained throughout the outpatient stay will include: orient to surroundings, keep bed in low position, maintain call bell within reach at all times, provide assistance with transfer out of bed and ambulation.

## 2024-02-08 NOTE — Progress Notes (Unsigned)
 PROVIDER NOTE: Interpretation of information contained herein should be left to medically-trained personnel. Specific patient instructions are provided elsewhere under "Patient Instructions" section of medical record. This document was created in part using STT-dictation technology, any transcriptional errors that may result from this process are unintentional.  Patient: Cynthia Bennett Type: Established DOB: [birth year] MRN: [id number] PCP: Jerl Mina, MD  Service: Procedure DOS: 02/09/2024 Setting: Ambulatory Location: Ambulatory outpatient facility Delivery: Face-to-face Provider: Oswaldo Done, MD Specialty: Interventional Pain Management Specialty designation: 09 Location: Outpatient facility Ref. Prov.: Jerl Mina, MD       Interventional Therapy   Type: Lumbar epidural steroid injection (LESI) (interlaminar)  #9     Laterality: Left   Level:  L4-5 Level.  Imaging: Fluoroscopic guidance Spinal ([id number]) Anesthesia: Local anesthesia (1-2% Lidocaine) Anxiolysis: None                 Sedation: No Sedation                       DOS: 02/09/2024  Performed by: Oswaldo Done, MD  Purpose: Diagnostic/Therapeutic Indications: Lumbar radicular pain of intraspinal etiology of more than 4 weeks that has failed to respond to conservative therapy and is severe enough to impact quality of life or function. 1. Chronic low back pain (1ry area of Pain) (Bilateral) (L>R) w/o sciatica   2. Chronic lower extremity pain (2ry area of Pain) (Left)   3. Chronic lumbar radicular pain (Left) (L5 Dermatome)   4. Chronic hip pain (Left)   5. DDD (degenerative disc disease), lumbar w/ LEP   6. Grade 1 spondylolisthesis of L4 over L5   7. Numbness and tingling of left leg   8. Severe (5-6 mm) L4-5 lumbar spinal stenosis and (8 mm) L3-4 spinal stenosis.   9. Scoliosis of thoracolumbar region due to degenerative disease of spine in adult   10. Weakness of leg (Left)    NAS-11 Pain score:    Pre-procedure: 4 /10   Post-procedure: 4 /10      Position / Prep / Materials:  Position: Prone w/ head of the table raised (slight reverse trendelenburg) to facilitate breathing.  Prep solution: ChloraPrep (2% chlorhexidine gluconate and 70% isopropyl alcohol) Prep Area: Entire Posterior Lumbar Region from lower scapular tip down to mid buttocks area and from flank to flank. Materials:  Tray: Epidural tray Needle(s):  Type: Epidural needle (Tuohy) Gauge (G):  17 Length: Regular (3.5-in) Qty: 1   H&P (Pre-op Assessment):  Cynthia Bennett is a 89 y.o. (year old), female patient, seen today for interventional treatment. She  has a past surgical history that includes Esophagogastroduodenoscopy (N/A, 04/16/2015); Appendectomy; and Abdominal hysterectomy. Cynthia Bennett has a current medication list which includes the following prescription(s): acetaminophen, chlorhexidine, vitamin d3, cyanocobalamin, diclofenac sodium, dorzolamide-timolol, doxepin, esomeprazole, guaifenesin, magnesium oxide, naloxone, ondansetron, oxycodone, [START ON 02/20/2024] oxycodone, [START ON 03/21/2024] oxycodone, sertraline, and zolpidem. Her primarily concern today is the Back Pain  Initial Vital Signs:  Pulse/HCG Rate: 70ECG Heart Rate: 60 Temp: 98.2 F (36.8 C) Resp: 16 BP: 127/80 SpO2: 99 %  BMI: Estimated body mass index is 26.52 kg/m as calculated from the following:   Height as of this encounter: 5\' 2"  (1.575 m).   Weight as of this encounter: 145 lb (65.8 kg).  Risk Assessment: Allergies: Reviewed. She is allergic to ibuprofen.  Allergy Precautions: None required Coagulopathies: Reviewed. None identified.  Blood-thinner therapy: None at this time Active Infection(s): Reviewed. None identified. Cynthia Bennett  is afebrile  Site Confirmation: Cynthia Bennett was asked to confirm the procedure and laterality before marking the site Procedure checklist: Completed Consent: Before the procedure and under the influence of no  sedative(s), amnesic(s), or anxiolytics, the patient was informed of the treatment options, risks and possible complications. To fulfill our ethical and legal obligations, as recommended by the American Medical Association's Code of Ethics, I have informed the patient of my clinical impression; the nature and purpose of the treatment or procedure; the risks, benefits, and possible complications of the intervention; the alternatives, including doing nothing; the risk(s) and benefit(s) of the alternative treatment(s) or procedure(s); and the risk(s) and benefit(s) of doing nothing. The patient was provided information about the general risks and possible complications associated with the procedure. These may include, but are not limited to: failure to achieve desired goals, infection, bleeding, organ or nerve damage, allergic reactions, paralysis, and death. In addition, the patient was informed of those risks and complications associated to Spine-related procedures, such as failure to decrease pain; infection (i.e.: Meningitis, epidural or intraspinal abscess); bleeding (i.e.: epidural hematoma, subarachnoid hemorrhage, or any other type of intraspinal or peri-dural bleeding); organ or nerve damage (i.e.: Any type of peripheral nerve, nerve root, or spinal cord injury) with subsequent damage to sensory, motor, and/or autonomic systems, resulting in permanent pain, numbness, and/or weakness of one or several areas of the body; allergic reactions; (i.e.: anaphylactic reaction); and/or death. Furthermore, the patient was informed of those risks and complications associated with the medications. These include, but are not limited to: allergic reactions (i.e.: anaphylactic or anaphylactoid reaction(s)); adrenal axis suppression; blood sugar elevation that in diabetics may result in ketoacidosis or comma; water retention that in patients with history of congestive heart failure may result in shortness of breath,  pulmonary edema, and decompensation with resultant heart failure; weight gain; swelling or edema; medication-induced neural toxicity; particulate matter embolism and blood vessel occlusion with resultant organ, and/or nervous system infarction; and/or aseptic necrosis of one or more joints. Finally, the patient was informed that Medicine is not an exact science; therefore, there is also the possibility of unforeseen or unpredictable risks and/or possible complications that may result in a catastrophic outcome. The patient indicated having understood very clearly. We have given the patient no guarantees and we have made no promises. Enough time was given to the patient to ask questions, all of which were answered to the patient's satisfaction. Ms. Hinzman has indicated that she wanted to continue with the procedure. Attestation: I, the ordering provider, attest that I have discussed with the patient the benefits, risks, side-effects, alternatives, likelihood of achieving goals, and potential problems during recovery for the procedure that I have provided informed consent. Date  Time: 02/09/2024 10:52 AM  Pre-Procedure Preparation:  Monitoring: As per clinic protocol. Respiration, ETCO2, SpO2, BP, heart rate and rhythm monitor placed and checked for adequate function Safety Precautions: Patient was assessed for positional comfort and pressure points before starting the procedure. Time-out: I initiated and conducted the "Time-out" before starting the procedure, as per protocol. The patient was asked to participate by confirming the accuracy of the "Time Out" information. Verification of the correct person, site, and procedure were performed and confirmed by me, the nursing staff, and the patient. "Time-out" conducted as per Joint Commission's Universal Protocol (UP.01.01.01). Time: 1120 Start Time: 1120 hrs.  Description/Narrative of Procedure:          Target: Epidural space via interlaminar opening,  initially targeting the lower laminar border  of the superior vertebral body. Region: Lumbar Approach: Percutaneous paravertebral  Rationale (medical necessity): procedure needed and proper for the diagnosis and/or treatment of the patient's medical symptoms and needs. Procedural Technique Safety Precautions: Aspiration looking for blood return was conducted prior to all injections. At no point did we inject any substances, as a needle was being advanced. No attempts were made at seeking any paresthesias. Safe injection practices and needle disposal techniques used. Medications properly checked for expiration dates. SDV (single dose vial) medications used. Description of the Procedure: Protocol guidelines were followed. The procedure needle was introduced through the skin, ipsilateral to the reported pain, and advanced to the target area. Bone was contacted and the needle walked caudad, until the lamina was cleared. The epidural space was identified using "loss-of-resistance technique" with 2-3 ml of PF-NaCl (0.9% NSS), in a 5cc LOR glass syringe.  Vitals:   02/09/24 1050 02/09/24 1118 02/09/24 1123 02/09/24 1125  BP: 127/80 (!) 145/84 100/75 128/79  Pulse: 70     Resp: 16 18 18    Temp: 98.2 F (36.8 C)     SpO2: 99% 97% 99% 95%  Weight: 145 lb (65.8 kg)     Height: 5\' 2"  (1.575 m)       Start Time: 1120 hrs. End Time: 1125 hrs.  Imaging Guidance (Spinal):          Type of Imaging Technique: Fluoroscopy Guidance (Spinal) Indication(s): Fluoroscopy guidance for needle placement to enhance accuracy in procedures requiring precise needle localization for targeted delivery of medication in or near specific anatomical locations not easily accessible without such real-time imaging assistance. Exposure Time: Please see nurses notes. Contrast: Before injecting any contrast, we confirmed that the patient did not have an allergy to iodine, shellfish, or radiological contrast. Once satisfactory needle  placement was completed at the desired level, radiological contrast was injected. Contrast injected under live fluoroscopy. No contrast complications. See chart for type and volume of contrast used. Fluoroscopic Guidance: I was personally present during the use of fluoroscopy. "Tunnel Vision Technique" used to obtain the best possible view of the target area. Parallax error corrected before commencing the procedure. "Direction-depth-direction" technique used to introduce the needle under continuous pulsed fluoroscopy. Once target was reached, antero-posterior, oblique, and lateral fluoroscopic projection used confirm needle placement in all planes. Images permanently stored in EMR. Interpretation: I personally interpreted the imaging intraoperatively. Adequate needle placement confirmed in multiple planes. Appropriate spread of contrast into desired area was observed. No evidence of afferent or efferent intravascular uptake. No intrathecal or subarachnoid spread observed. Permanent images saved into the patient's record.  Antibiotic Prophylaxis:   Anti-infectives (From admission, onward)    None      Indication(s): None identified  Post-operative Assessment:  Post-procedure Vital Signs:  Pulse/HCG Rate: 7062 Temp: 98.2 F (36.8 C) Resp: 18 BP: 128/79 SpO2: 95 %  EBL: None  Complications: No immediate post-treatment complications observed by team, or reported by patient.  Note: The patient tolerated the entire procedure well. A repeat set of vitals were taken after the procedure and the patient was kept under observation following institutional policy, for this type of procedure. Post-procedural neurological assessment was performed, showing return to baseline, prior to discharge. The patient was provided with post-procedure discharge instructions, including a section on how to identify potential problems. Should any problems arise concerning this procedure, the patient was given  instructions to immediately contact us, at any time, without hesitation. In any case, we plan to contact the patient by  telephone for a follow-up status report regarding this interventional procedure.  Comments:  No additional relevant information.  Plan of Care (POC)  Orders:  Orders Placed This Encounter  Procedures   Lumbar Epidural Injection    Scheduling Instructions:     Procedure: Interlaminar LESI L4-5     Laterality: Left     Sedation: Patient's choice     Timeframe: Today    Where will this procedure be performed?:   [facility]   [facility] C-ARM 1-60 MIN NO REPORT    Intraoperative interpretation by procedural physician at [facility].    Standing Status:   Standing    Number of Occurrences:   1    Reason for exam::   Assistance in needle guidance and placement for procedures requiring needle placement in or near specific anatomical locations not easily accessible without such assistance.   Informed Consent Details: Physician/Practitioner Attestation; Transcribe to consent form and obtain patient signature    Note: Always confirm laterality of pain with Ms. Ege, before procedure. Transcribe to consent form and obtain patient signature.    Physician/Practitioner attestation of informed consent for procedure/surgical case:   I, the physician/practitioner, attest that I have discussed with the patient the benefits, risks, side effects, alternatives, likelihood of achieving goals and potential problems during recovery for the procedure that I have provided informed consent.    Procedure:   Lumbar epidural steroid injection under fluoroscopic guidance    Physician/Practitioner performing the procedure:   Kip Cropp A. Laban Emperor, MD    Indication/Reason:   Low back and/or lower extremity pain secondary to lumbar radiculitis   Provide equipment / supplies at bedside    Procedural tray: Epidural Tray (Disposable  single use) Skin infiltration needle: Regular  1.5-in, 25-G, (x1) Block needle size: Regular standard Catheter: No catheter required    Standing Status:   Standing    Number of Occurrences:   1    Specify:   Epidural Tray   Chronic Opioid Analgesic:   Oxycodone IR 5 mg, 1 tab PO daily (maximum of 5 mg/day of oxycodone.) MME/day: 7.5 mg/day.   Medications ordered for procedure: Meds ordered this encounter  Medications   iohexol (OMNIPAQUE) 180 MG/ML injection 10 mL    Must be Myelogram-compatible. If not available, you may substitute with a water-soluble, non-ionic, hypoallergenic, myelogram-compatible radiological contrast medium.   lidocaine (XYLOCAINE) 2 % (with pres) injection 400 mg   pentafluoroprop-tetrafluoroeth (GEBAUERS) aerosol   DISCONTD: midazolam (VERSED) injection 0.5-2 mg    Make sure Flumazenil is available in the pyxis when using this medication. If oversedation occurs, administer 0.2 mg IV over 15 sec. If after 45 sec no response, administer 0.2 mg again over 1 min; may repeat at 1 min intervals; not to exceed 4 doses (1 mg)   sodium chloride flush (NS) 0.9 % injection 2 mL   ropivacaine (PF) 2 mg/mL (0.2%) (NAROPIN) injection 2 mL   triamcinolone acetonide (KENALOG-40) injection 40 mg   Medications administered: We administered iohexol, lidocaine, pentafluoroprop-tetrafluoroeth, sodium chloride flush, ropivacaine (PF) 2 mg/mL (0.2%), and triamcinolone acetonide.  See the medical record for exact dosing, route, and time of administration.  Follow-up plan:   Return in about 2 weeks (around 02/23/2024) for (VV), (PPE).       Interventional Therapies  Risk Factors  Complex Considerations:  Advanced age   Planned  Pending:   Palliative left L4-5 LESI #9    Under consideration:   Palliative left L4-5 and L5-S1  lumbar facet MBB  Possible lumbar facet RFA    Completed:   Diagnostic left L5-S1 pseudoarthrosis inj. #1 (01/09/2022) (10/10 to 0/10)  Therapeutic left L4-5 LESI x8 (06/25/2021) (100/100/80/90)   Therapeutic right L4-5 LESI x1 (07/21/2017) (DNR-F/U)  Palliative right L5-S1 LESI x1 (09/27/2015) (100/100/90)  Therapeutic left L5-S1 LESI x1 (02/05/2016) (100/100/100/100)  Diagnostic right SI joint injection x2 (08/25/2019) (100/100/40/30)  Diagnostic left SI joint injection x2 (01/27/2019) (100/100/100/100)  Diagnostic left glenohumeral joint inj. x1 (04/29/2017) (100/100/98/98)  Left biceps muscle trigger point inj. x1 (07/21/2017) (DNR-F/U)  Right PSIS trigger point inj. x1 (07/27/2018) (100/100/50/>50)  Diagnostic right lumbar facet MBB x3 (01/09/2022) (10/10 to 0/10)  Diagnostic left L4-5 and L5-S1 lumbar facet MBB x5 (09/29/2023) (100/100/100/100)  Diagnostic left IA hip inj. x1 (01/27/2019) (100/100/100/100)    Completed by other providers:   None at this time   Therapeutic  Palliative (PRN) options:   None established      Recent Visits Date Type Provider Dept  02/02/24 Procedure visit Delano Metz, MD [facility]  01/18/24 Office Visit Delano Metz, MD [facility]  12/21/23 Office Visit Delano Metz, MD [facility]  Showing recent visits within past 90 days and meeting all other requirements Today's Visits Date Type Provider Dept  02/09/24 Procedure visit Delano Metz, MD [facility]  Showing today's visits and meeting all other requirements Future Appointments Date Type Provider Dept  02/23/24 Appointment Delano Metz, MD [facility]  04/11/24 Appointment Delano Metz, MD [facility]  Showing future appointments within next 90 days and meeting all other requirements  Disposition: Discharge home  Discharge (Date  Time): 02/09/2024; 1135 hrs.   Primary Care Physician: Jerl Mina, MD Location: [facility] Note by: Oswaldo Done, MD (TTS technology used. I apologize for any typographical errors that were not detected and  corrected.) Date: 02/09/2024; Time: 12:00 PM  Disclaimer:  Medicine is not an Visual merchandiser. The only guarantee in medicine is that nothing is guaranteed. It is important to note that the decision to proceed with this intervention was based on the information collected from the patient. The Data and conclusions were drawn from the patient's questionnaire, the interview, and the physical examination. Because the information was provided in large part by the patient, it cannot be guaranteed that it has not been purposely or unconsciously manipulated. Every effort has been made to obtain as much relevant data as possible for this evaluation. It is important to note that the conclusions that lead to this procedure are derived in large part from the available data. Always take into account that the treatment will also be dependent on availability of resources and existing treatment guidelines, considered by other Pain Management Practitioners as being common knowledge and practice, at the time of the intervention. For Medico-Legal purposes, it is also important to point out that variation in procedural techniques and pharmacological choices are the acceptable norm. The indications, contraindications, technique, and results of the above procedure should only be interpreted and judged by a Board-Certified Interventional Pain Specialist with extensive familiarity and expertise in the same exact procedure and technique.

## 2024-02-09 ENCOUNTER — Ambulatory Visit
Admission: RE | Admit: 2024-02-09 | Discharge: 2024-02-09 | Disposition: A | Discharge status: Home / Self Care | Source: Ambulatory Visit | Attending: Pain Medicine | Admitting: Pain Medicine

## 2024-02-09 ENCOUNTER — Ambulatory Visit: Attending: Pain Medicine | Admitting: Pain Medicine

## 2024-02-09 ENCOUNTER — Encounter: Payer: Self-pay | Admitting: Pain Medicine

## 2024-02-09 VITALS — BP 128/79 | HR 70 | Temp 98.2°F | Resp 18 | Ht 62.0 in | Wt 145.0 lb

## 2024-02-09 DIAGNOSIS — M79605 Pain in left leg: Secondary | ICD-10-CM | POA: Insufficient documentation

## 2024-02-09 DIAGNOSIS — G8929 Other chronic pain: Secondary | ICD-10-CM | POA: Insufficient documentation

## 2024-02-09 DIAGNOSIS — M48062 Spinal stenosis, lumbar region with neurogenic claudication: Secondary | ICD-10-CM | POA: Diagnosis not present

## 2024-02-09 DIAGNOSIS — R202 Paresthesia of skin: Secondary | ICD-10-CM | POA: Diagnosis not present

## 2024-02-09 DIAGNOSIS — M51361 Other intervertebral disc degeneration, lumbar region with lower extremity pain only: Secondary | ICD-10-CM | POA: Insufficient documentation

## 2024-02-09 DIAGNOSIS — M4155 Other secondary scoliosis, thoracolumbar region: Secondary | ICD-10-CM | POA: Insufficient documentation

## 2024-02-09 DIAGNOSIS — M25552 Pain in left hip: Secondary | ICD-10-CM | POA: Diagnosis not present

## 2024-02-09 DIAGNOSIS — M4316 Spondylolisthesis, lumbar region: Secondary | ICD-10-CM | POA: Diagnosis not present

## 2024-02-09 DIAGNOSIS — M5416 Radiculopathy, lumbar region: Secondary | ICD-10-CM | POA: Diagnosis not present

## 2024-02-09 DIAGNOSIS — R29898 Other symptoms and signs involving the musculoskeletal system: Secondary | ICD-10-CM | POA: Insufficient documentation

## 2024-02-09 DIAGNOSIS — M545 Low back pain, unspecified: Secondary | ICD-10-CM | POA: Diagnosis not present

## 2024-02-09 DIAGNOSIS — R200 Anesthesia of skin: Secondary | ICD-10-CM | POA: Insufficient documentation

## 2024-02-09 MED ORDER — LIDOCAINE HCL 2 % IJ SOLN
20.0000 mL | Freq: Once | INTRAMUSCULAR | Status: AC
  Administered 2024-02-09: 100 mg
  Filled 2024-02-09: qty 40

## 2024-02-09 MED ORDER — MIDAZOLAM HCL 2 MG/2ML IJ SOLN: 0.5000 mg | Freq: Once | INTRAMUSCULAR | Status: DC

## 2024-02-09 MED ORDER — TRIAMCINOLONE ACETONIDE 40 MG/ML IJ SUSP
40.0000 mg | Freq: Once | INTRAMUSCULAR | Status: AC
  Administered 2024-02-09: 40 mg
  Filled 2024-02-09: qty 1

## 2024-02-09 MED ORDER — SODIUM CHLORIDE 0.9% FLUSH
2.0000 mL | Freq: Once | INTRAVENOUS | Status: AC
  Administered 2024-02-09: 2 mL

## 2024-02-09 MED ORDER — ROPIVACAINE HCL 2 MG/ML IJ SOLN
2.0000 mL | Freq: Once | INTRAMUSCULAR | Status: AC
Start: 2024-02-09 — End: 2024-02-09
  Administered 2024-02-09: 2 mL via EPIDURAL
  Filled 2024-02-09: qty 20

## 2024-02-09 MED ORDER — IOHEXOL 180 MG/ML  SOLN
10.0000 mL | Freq: Once | INTRAMUSCULAR | Status: AC
  Administered 2024-02-09: 10 mL via EPIDURAL
  Filled 2024-02-09: qty 20

## 2024-02-09 MED ORDER — PENTAFLUOROPROP-TETRAFLUOROETH EX AERO
INHALATION_SPRAY | Freq: Once | CUTANEOUS | Status: AC
Start: 2024-02-09 — End: 2024-02-09
  Administered 2024-02-09: 30 via TOPICAL

## 2024-02-09 NOTE — Patient Instructions (Signed)

## 2024-02-09 NOTE — Progress Notes (Signed)
 Safety precautions to be maintained throughout the outpatient stay will include: orient to surroundings, keep bed in low position, maintain call bell within reach at all times, provide assistance with transfer out of bed and ambulation.

## 2024-02-10 ENCOUNTER — Telehealth: Payer: Self-pay

## 2024-02-10 NOTE — Telephone Encounter (Signed)
 Post procedure follow up.  The person I called is not accepting the call.  Unable to leave messsage

## 2024-02-22 ENCOUNTER — Telehealth: Payer: Self-pay

## 2024-02-22 NOTE — Telephone Encounter (Signed)
 Call to patient for nursing question prior to telephone visit with Dr. Barth Borne tomorrow.

## 2024-02-23 ENCOUNTER — Ambulatory Visit: Attending: Pain Medicine | Admitting: Pain Medicine

## 2024-02-23 DIAGNOSIS — M4155 Other secondary scoliosis, thoracolumbar region: Secondary | ICD-10-CM

## 2024-02-23 DIAGNOSIS — M51361 Other intervertebral disc degeneration, lumbar region with lower extremity pain only: Secondary | ICD-10-CM

## 2024-02-23 DIAGNOSIS — Z09 Encounter for follow-up examination after completed treatment for conditions other than malignant neoplasm: Secondary | ICD-10-CM

## 2024-02-23 DIAGNOSIS — G8929 Other chronic pain: Secondary | ICD-10-CM

## 2024-02-23 DIAGNOSIS — M4316 Spondylolisthesis, lumbar region: Secondary | ICD-10-CM

## 2024-02-23 DIAGNOSIS — R29898 Other symptoms and signs involving the musculoskeletal system: Secondary | ICD-10-CM

## 2024-02-23 DIAGNOSIS — M48062 Spinal stenosis, lumbar region with neurogenic claudication: Secondary | ICD-10-CM

## 2024-02-23 DIAGNOSIS — R200 Anesthesia of skin: Secondary | ICD-10-CM

## 2024-02-23 DIAGNOSIS — M545 Low back pain, unspecified: Secondary | ICD-10-CM

## 2024-02-23 DIAGNOSIS — Z91199 Patient's noncompliance with other medical treatment and regimen due to unspecified reason: Secondary | ICD-10-CM

## 2024-02-23 NOTE — Progress Notes (Signed)
 Unsuccessful attempt to contact patient for Virtual Visit (Pain Management Telehealth)   Patient provided contact information:  [phone] (home); [phone] (mobile); (Preferred) [phone] [name]   Pre-screening:  Our staff was successful in contacting Cynthia Bennett using the above provided information.   I unsuccessfully attempted to make contact with Cynthia Bennett on 02/23/2024 via telephone. I was unable to complete the virtual encounter due to call going directly to voicemail. I was unable to leave a message due to the mailbox being full and not accepting any new messages.  Note: The information below was obtained from the nurses precharting evaluation.  Post-procedure evaluation   Type: Lumbar epidural steroid injection (LESI) (interlaminar)  #9     Laterality: Left   Level:  L4-5 Level.  Imaging: Fluoroscopic guidance Spinal ([id number]) Anesthesia: Local anesthesia (1-2% Lidocaine) Anxiolysis: None                 Sedation: No Sedation                       DOS: 02/09/2024  Performed by: Oswaldo Done, MD  Purpose: Diagnostic/Therapeutic Indications: Lumbar radicular pain of intraspinal etiology of more than 4 weeks that has failed to respond to conservative therapy and is severe enough to impact quality of life or function. 1. Chronic low back pain (1ry area of Pain) (Bilateral) (L>R) w/o sciatica   2. Chronic lower extremity pain (2ry area of Pain) (Left)   3. Chronic lumbar radicular pain (Left) (L5 Dermatome)   4. Chronic hip pain (Left)   5. DDD (degenerative disc disease), lumbar w/ LEP   6. Grade 1 spondylolisthesis of L4 over L5   7. Numbness and tingling of left leg   8. Severe (5-6 mm) L4-5 lumbar spinal stenosis and (8 mm) L3-4 spinal stenosis.   9. Scoliosis of thoracolumbar region due to degenerative disease of spine in adult   10. Weakness of leg (Left)    NAS-11 Pain score:   Pre-procedure: 4 /10   Post-procedure: 4 /10     Effectiveness:  Initial hour after procedure: 100 %. Subsequent 4-6 hours post-procedure: 100 %. Analgesia past initial 6 hours: 85 %.   Pharmacotherapy Assessment  Analgesic: Oxycodone IR 5 mg, 1 tab PO daily (maximum of 5 mg/day of oxycodone.) MME/day: 7.5 mg/day.    Follow-up plan:   Reschedule Visit.     Interventional Therapies  Risk Factors  Complex Considerations:  Advanced age   Planned  Pending:   Palliative left L4-5 LESI #9    Under consideration:   Palliative left L4-5 and L5-S1 lumbar facet MBB  Possible lumbar facet RFA    Completed:   Diagnostic left L5-S1 pseudoarthrosis inj. #1 (01/09/2022) (10/10 to 0/10)  Therapeutic left L4-5 LESI x8 (06/25/2021) (100/100/80/90)  Therapeutic right L4-5 LESI x1 (07/21/2017) (DNR-F/U)  Palliative right L5-S1 LESI x1 (09/27/2015) (100/100/90)  Therapeutic left L5-S1 LESI x1 (02/05/2016) (100/100/100/100)  Diagnostic right SI joint injection x2 (08/25/2019) (100/100/40/30)  Diagnostic left SI joint injection x2 (01/27/2019) (100/100/100/100)  Diagnostic left glenohumeral joint inj. x1 (04/29/2017) (100/100/98/98)  Left biceps muscle trigger point inj. x1 (07/21/2017) (DNR-F/U)  Right PSIS trigger point inj. x1 (07/27/2018) (100/100/50/>50)  Diagnostic right lumbar facet MBB x3 (01/09/2022) (10/10 to 0/10)  Diagnostic left L4-5 and L5-S1 lumbar facet MBB x5 (09/29/2023) (100/100/100/100)  Diagnostic left IA hip inj. x1 (01/27/2019) (100/100/100/100)    Completed by other providers:   None at this time   Therapeutic  Palliative (  PRN) options:   None established     Recent Visits Date Type Provider Dept  02/09/24 Procedure visit Renaldo Caroli, MD [facility]  02/02/24 Procedure visit Renaldo Caroli, MD [facility]  01/18/24 Office Visit Renaldo Caroli, MD [facility]  12/21/23 Office Visit Renaldo Caroli, MD [facility]  Showing recent visits within past 90 days  and meeting all other requirements Today's Visits Date Type Provider Dept  02/23/24 Office Visit Renaldo Caroli, MD [facility]  Showing today's visits and meeting all other requirements Future Appointments Date Type Provider Dept  04/11/24 Appointment Renaldo Caroli, MD [facility]  Showing future appointments within next 90 days and meeting all other requirements   Note by: Candi Chafe, MD Date: 02/23/2024; Time: 4:20 PM

## 2024-04-11 ENCOUNTER — Encounter: Payer: Self-pay | Admitting: Nurse Practitioner

## 2024-04-11 ENCOUNTER — Ambulatory Visit: Attending: Nurse Practitioner | Admitting: Nurse Practitioner

## 2024-04-11 ENCOUNTER — Encounter: Admitting: Pain Medicine

## 2024-04-11 DIAGNOSIS — G8929 Other chronic pain: Secondary | ICD-10-CM

## 2024-04-11 DIAGNOSIS — Z79891 Long term (current) use of opiate analgesic: Secondary | ICD-10-CM

## 2024-04-11 DIAGNOSIS — M4155 Other secondary scoliosis, thoracolumbar region: Secondary | ICD-10-CM

## 2024-04-11 DIAGNOSIS — M4316 Spondylolisthesis, lumbar region: Secondary | ICD-10-CM | POA: Diagnosis not present

## 2024-04-11 DIAGNOSIS — M25552 Pain in left hip: Secondary | ICD-10-CM | POA: Diagnosis not present

## 2024-04-11 DIAGNOSIS — Z79899 Other long term (current) drug therapy: Secondary | ICD-10-CM

## 2024-04-11 DIAGNOSIS — M25551 Pain in right hip: Secondary | ICD-10-CM | POA: Insufficient documentation

## 2024-04-11 DIAGNOSIS — M79605 Pain in left leg: Secondary | ICD-10-CM | POA: Diagnosis not present

## 2024-04-11 DIAGNOSIS — M5416 Radiculopathy, lumbar region: Secondary | ICD-10-CM | POA: Insufficient documentation

## 2024-04-11 DIAGNOSIS — M48062 Spinal stenosis, lumbar region with neurogenic claudication: Secondary | ICD-10-CM

## 2024-04-11 DIAGNOSIS — M4726 Other spondylosis with radiculopathy, lumbar region: Secondary | ICD-10-CM | POA: Diagnosis not present

## 2024-04-11 DIAGNOSIS — G894 Chronic pain syndrome: Secondary | ICD-10-CM | POA: Diagnosis not present

## 2024-04-11 DIAGNOSIS — M47816 Spondylosis without myelopathy or radiculopathy, lumbar region: Secondary | ICD-10-CM | POA: Diagnosis not present

## 2024-04-11 DIAGNOSIS — M5135 Other intervertebral disc degeneration, thoracolumbar region: Secondary | ICD-10-CM | POA: Diagnosis not present

## 2024-04-11 DIAGNOSIS — M545 Low back pain, unspecified: Secondary | ICD-10-CM | POA: Diagnosis not present

## 2024-04-11 DIAGNOSIS — Q7649 Other congenital malformations of spine, not associated with scoliosis: Secondary | ICD-10-CM

## 2024-04-11 MED ORDER — OXYCODONE HCL 5 MG PO TABS: 5.0000 mg | ORAL_TABLET | Freq: Every day | ORAL | 0 refills | Status: DC | PRN

## 2024-04-11 NOTE — Progress Notes (Signed)
 Nursing Pain Medication Assessment:  Safety precautions to be maintained throughout the outpatient stay will include: orient to surroundings, keep bed in low position, maintain call bell within reach at all times, provide assistance with transfer out of bed and ambulation.  Medication Inspection Compliance: Pill count conducted under aseptic conditions, in front of the patient. Neither the pills nor the bottle was removed from the patient's sight at any time. Once count was completed pills were immediately returned to the patient in their original bottle.  Medication: oxycodone  Pill/Patch Count: 10 of 30 pills/patches remain Pill/Patch Appearance: Markings consistent with prescribed medication Bottle Appearance: Standard pharmacy container. Clearly labeled. Filled Date: 5 / 27 / 2025 Last Medication intake:  Yesterday

## 2024-04-11 NOTE — Progress Notes (Signed)
 PROVIDER NOTE: Interpretation of information contained herein should be left to medically-trained personnel. Specific patient instructions are provided elsewhere under "Patient Instructions" section of medical record. This document was created in part using AI and STT-dictation technology, any transcriptional errors that may result from this process are unintentional.  Patient: Cynthia Bennett  Service: E/M   PCP: Cynthia San, MD  DOB: 1933-10-31  DOS: 04/11/2024  Provider: Cherylin Corrigan, NP  MRN: [id number]  Delivery: Face-to-face  Specialty: Interventional Pain Management  Type: Established Patient  Setting: Ambulatory outpatient facility  Specialty designation: 09  Referring Prov.: Cynthia San, MD  Location: Outpatient office facility       History of present illness (HPI) [name], a 89 y.o. year old female, is here today because of her No primary diagnosis found.. Cynthia Bennett primary complain today is Back Pain   Pain Assessment: Severity of Chronic pain is reported as a 8 /10. Location: Back Lower/down left leg to left foot, left toes are numb. Onset: More than a month ago. Quality: Numbness, Sore, Aching. Timing: Constant. Modifying factor(s): meds, sitting. Vitals:  height is 5\' 2"  (1.575 m) and weight is 145 lb (65.8 kg). Her temperature is 97.3 F (36.3 C) (abnormal). Her blood pressure is 127/86 and her pulse is 87. Her respiration is 16 and oxygen saturation is 99%.  BMI: Estimated body mass index is 26.52 kg/m as calculated from the following:   Height as of this encounter: 5\' 2"  (1.575 m).   Weight as of this encounter: 145 lb (65.8 kg).  Last encounter: 02/23/2024 Last procedure: 02/09/2024  Reason for encounter: medication management.  The patient indicates doing well with current medication regimen. No side effects or any adverse reaction reported to medication. She complains of lower back pain; however pain medication regimen helps to manage daily functioning.    Pharmacotherapy Assessment  Analgesic: Oxycodone  (Oxy IR/ Roxicodone ) 5 mg immediate release daily as needed for severe pain. MME=7.50 Monitoring: Cynthia Bennett PMP: PDMP reviewed during this encounter.       Pharmacotherapy: No side-effects or adverse reactions reported. Compliance: No problems identified. Effectiveness: Clinically acceptable.  Cynthia Rabon, RN  04/11/2024 11:28 AM  Sign when Signing Visit Nursing Pain Medication Assessment:  Safety precautions to be maintained throughout the outpatient stay will include: orient to surroundings, keep bed in low position, maintain call bell within reach at all times, provide assistance with transfer out of bed and ambulation.  Medication Inspection Compliance: Pill count conducted under aseptic conditions, in front of the patient. Neither the pills nor the bottle was removed from the patient's sight at any time. Once count was completed pills were immediately returned to the patient in their original bottle.  Medication: oxycodone  Pill/Patch Count: 10 of 30 pills/patches remain Pill/Patch Appearance: Markings consistent with prescribed medication Bottle Appearance: Standard pharmacy container. Clearly labeled. Filled Date: 5 / 12 / 2025 Last Medication intake:  Yesterday  No results found for: "CBDTHCR" No results found for: "D8THCCBX" No results found for: "D9THCCBX"  UDS:  Summary  Date Value Ref Range Status  04/22/2023 Note  Final    Comment:    ==================================================================== ToxASSURE Select 13 (MW) ==================================================================== Specimen Alert Not Detected result may be consistent with the time of last use noted for this medication. AS NEEDED (Oxycodone ) ==================================================================== Test                             Result  Flag       Units  Drug Absent but Declared for Prescription Verification   Oxycodone                        Not Detected UNEXPECTED ng/mg creat ==================================================================== Test                      Result    Flag   Units      Ref Range   Creatinine              65               mg/dL      >=16 ==================================================================== Declared Medications:  The flagging and interpretation on this report are based on the  following declared medications.  Unexpected results may arise from  inaccuracies in the declared medications.   **Note: The testing scope of this panel includes these medications:   Oxycodone    **Note: The testing scope of this panel does not include the  following reported medications:   Acetaminophen  (Tylenol )  Doxepin (Sinequan)  Esomeprazole (Nexium)  Eye Drops  Magnesium (Mag-Ox)  Naloxone  (Narcan )  Ondansetron  (Zofran )  Sertraline (Zoloft)  Trazodone (Desyrel)  Vitamin D3  Zolpidem (Ambien) ==================================================================== For clinical consultation, please call [phone]. ====================================================================      ROS  Constitutional: Denies any fever or chills Gastrointestinal: No reported hemesis, hematochezia, vomiting, or acute GI distress Musculoskeletal: Lower back pain  Neurological: No reported episodes of acute onset apraxia, aphasia, dysarthria, agnosia, amnesia, paralysis, loss of coordination, or loss of consciousness  Medication Review  Guaifenesin, Vitamin D3, acetaminophen , chlorhexidine, cyanocobalamin , diclofenac Sodium, dorzolamide-timolol, doxepin, esomeprazole, magnesium oxide, naloxone , ondansetron , oxyCODONE , sertraline, and zolpidem  History Review  Allergy: Cynthia Bennett is allergic to ibuprofen. Drug: Cynthia Bennett  reports no history of drug use. Alcohol:  reports no history of alcohol use. Tobacco:  reports that she has quit smoking. She has never used smokeless tobacco. Social:  Cynthia Bennett  reports that she has quit smoking. She has never used smokeless tobacco. She reports that she does not drink alcohol and does not use drugs. Medical:  has a past medical history of Dizzy spells, Fibromyalgia, Glaucoma, Hiatal hernia, Osteoarthritis, Osteoarthritis of spine with radiculopathy, lumbar region (08/14/2015), Peptic ulcer disease, Reflux, Spinal stenosis, Stroke (HCC), and TIA (transient ischemic attack). Surgical: Ms. Stacy  has a past surgical history that includes Esophagogastroduodenoscopy (N/A, 04/16/2015); Appendectomy; and Abdominal hysterectomy. Family: family history includes COPD in her father; Mental illness in her mother.  Laboratory Chemistry Profile   Renal Lab Results  Component Value Date   BUN 19 10/15/2023   CREATININE 0.94 10/15/2023   BCR 11 (L) 01/10/2019   GFRAA 46 (L) 01/10/2019   GFRNONAA 58 (L) 10/15/2023    Hepatic Lab Results  Component Value Date   AST 17 07/31/2022   ALT 10 07/31/2022   ALBUMIN 3.6 07/31/2022   ALKPHOS 54 07/31/2022   LIPASE 139 04/27/2014    Electrolytes Lab Results  Component Value Date   NA 139 10/15/2023   K 3.9 10/15/2023   CL 107 10/15/2023   CALCIUM 8.9 10/15/2023   MG 2.3 01/10/2019    Bone Lab Results  Component Value Date   25OHVITD1 26 (L) 01/10/2019   25OHVITD2 1.6 01/10/2019   25OHVITD3 24 01/10/2019    Inflammation (CRP: Acute Phase) (ESR: Chronic Phase) Lab Results  Component Value Date   CRP 0.9 08/24/2020   ESRSEDRATE  51 (H) 01/10/2019         Note: Above Lab results reviewed.  Recent Imaging Review  DG PAIN CLINIC C-ARM 1-60 MIN NO REPORT Fluoro was used, but no Radiologist interpretation will be provided.  Please refer to "NOTES" tab for provider progress note. Note: Reviewed         Physical Exam  General appearance: Well nourished, well developed, and well hydrated. In no apparent acute distress Mental status: Alert, oriented x 3 (person, place, & time)       Respiratory: No  evidence of acute respiratory distress Eyes: PERLA Vitals: BP 127/86   Pulse 87   Temp (!) 97.3 F (36.3 C)   Resp 16   Ht 5\' 2"  (1.575 m)   Wt 145 lb (65.8 kg)   SpO2 99%   BMI 26.52 kg/m  BMI: Estimated body mass index is 26.52 kg/m as calculated from the following:   Height as of this encounter: 5\' 2"  (1.575 m).   Weight as of this encounter: 145 lb (65.8 kg). Ideal: Ideal body weight: 50.1 kg (110 lb 7.2 oz) Adjusted ideal body weight: 56.4 kg (124 lb 4.3 oz)  Assessment   Diagnosis Status  1. Chronic bilateral low back pain without sciatica   2. Chronic pain of left lower extremity   3. Chronic pain of both hips   4. Spinal stenosis, lumbar region, with neurogenic claudication   5. Bertolotti's syndrome   6. Facet hypertrophy of lumbar region   7. Spondylolisthesis of lumbar region   8. Chronic radicular lumbar pain   9. Scoliosis of thoracolumbar region due to degenerative disease of spine in adult   10. DDD (degenerative disc disease), thoracolumbar   11. Chronic pain syndrome   12. Pharmacologic therapy   13. Chronic use of opiate for therapeutic purpose   14. Encounter for medication management   15. Encounter for chronic pain management    Controlled Controlled Controlled   Updated Problems: No problems updated.  Plan of Care  Problem-specific:  Assessment and Plan We will continue on current medication regimen. Prescribing Drug Monitoring (PDMP) reviewed; finding consistent with the use of prescribed medication and no evidence of narcotic misuse or abuse. Routine UDS ordered today. Schedule follow up in 90 days for medication management with Marthe Slain, NP.    Ms. SHERI GATCHEL has a current medication list which includes the following long-term medication(s): doxepin, zolpidem, [START ON 04/20/2024] oxycodone , [START ON 05/20/2024] oxycodone , and [START ON 06/19/2024] oxycodone .  Pharmacotherapy (Medications Ordered): Meds ordered this encounter   Medications   oxyCODONE  (OXY IR/ROXICODONE ) 5 MG immediate release tablet    Sig: Take 1 tablet (5 mg total) by mouth daily as needed for severe pain (pain score 7-10). Must last 30 days    Dispense:  30 tablet    Refill:  0    DO NOT: delete (not duplicate); no partial-fill (will deny script to complete), no refill request (F/U required). DISPENSE: 1 day early if closed on fill date. WARN: No CNS-depressants within 8 hrs of med.   oxyCODONE  (OXY IR/ROXICODONE ) 5 MG immediate release tablet    Sig: Take 1 tablet (5 mg total) by mouth daily as needed for severe pain (pain score 7-10). Must last 30 days    Dispense:  30 tablet    Refill:  0    DO NOT: delete (not duplicate); no partial-fill (will deny script to complete), no refill request (F/U required). DISPENSE: 1 day early if closed on  fill date. WARN: No CNS-depressants within 8 hrs of med.   oxyCODONE  (OXY IR/ROXICODONE ) 5 MG immediate release tablet    Sig: Take 1 tablet (5 mg total) by mouth daily as needed for severe pain (pain score 7-10). Must last 30 days    Dispense:  30 tablet    Refill:  0    DO NOT: delete (not duplicate); no partial-fill (will deny script to complete), no refill request (F/U required). DISPENSE: 1 day early if closed on fill date. WARN: No CNS-depressants within 8 hrs of med.   Orders:  Orders Placed This Encounter  Procedures   ToxASSURE Select 13 (MW), Urine    Volume: 30 ml(s). Minimum 3 ml of urine is needed. Document temperature of fresh sample. Indications: Long term (current) use of opiate analgesic (W41.324)    Release to patient:   Immediate        Return in about 3 months (around 07/12/2024) for (F2F), (MM), Marthe Slain NP.    Recent Visits Date Type Provider Dept  02/23/24 Office Visit Renaldo Caroli, MD [facility]  02/09/24 Procedure visit Renaldo Caroli, MD [facility]  02/02/24 Procedure visit Renaldo Caroli, MD [facility]  01/18/24 Office  Visit Renaldo Caroli, MD [facility]  Showing recent visits within past 90 days and meeting all other requirements Today's Visits Date Type Provider Dept  04/11/24 Office Visit Atzel Mccambridge K, NP [facility]  Showing today's visits and meeting all other requirements Future Appointments No visits were found meeting these conditions. Showing future appointments within next 90 days and meeting all other requirements  I discussed the assessment and treatment plan with the patient. The patient was provided an opportunity to ask questions and all were answered. The patient agreed with the plan and demonstrated an understanding of the instructions.  Patient advised to call back or seek an in-person evaluation if the symptoms or condition worsens.  Duration of encounter: 30 minutes.  Total time on encounter, as per AMA guidelines included both the face-to-face and non-face-to-face time personally spent by the physician and/or other qualified health care professional(s) on the day of the encounter (includes time in activities that require the physician or other qualified health care professional and does not include time in activities normally performed by clinical staff). Physician's time may include the following activities when performed: Preparing to see the patient (e.g., pre-charting review of records, searching for previously ordered imaging, lab work, and nerve conduction tests) Review of prior analgesic pharmacotherapies. Reviewing PMP Interpreting ordered tests (e.g., lab work, imaging, nerve conduction tests) Performing post-procedure evaluations, including interpretation of diagnostic procedures Obtaining and/or reviewing separately obtained history Performing a medically appropriate examination and/or evaluation Counseling and educating the patient/family/caregiver Ordering medications, tests, or procedures Referring and communicating with other health care  professionals (when not separately reported) Documenting clinical information in the electronic or other health record Independently interpreting results (not separately reported) and communicating results to the patient/ family/caregiver Care coordination (not separately reported)  Note by: Ayumi Wangerin K Terisa Belardo, NP (TTS and AI technology used. I apologize for any typographical errors that were not detected and corrected.) Date: 04/11/2024; Time: 11:46 AM

## 2024-04-13 LAB — TOXASSURE SELECT 13 (MW), URINE

## 2024-04-22 ENCOUNTER — Inpatient Hospital Stay: Payer: PPO | Attending: Internal Medicine | Admitting: Internal Medicine

## 2024-04-22 ENCOUNTER — Inpatient Hospital Stay: Payer: PPO

## 2024-04-22 VITALS — BP 131/65 | HR 85 | Resp 18

## 2024-04-22 DIAGNOSIS — K5900 Constipation, unspecified: Secondary | ICD-10-CM | POA: Insufficient documentation

## 2024-04-22 DIAGNOSIS — N1832 Chronic kidney disease, stage 3b: Secondary | ICD-10-CM | POA: Diagnosis not present

## 2024-04-22 DIAGNOSIS — D631 Anemia in chronic kidney disease: Secondary | ICD-10-CM | POA: Insufficient documentation

## 2024-04-22 DIAGNOSIS — R5383 Other fatigue: Secondary | ICD-10-CM | POA: Insufficient documentation

## 2024-04-22 DIAGNOSIS — D509 Iron deficiency anemia, unspecified: Secondary | ICD-10-CM | POA: Diagnosis not present

## 2024-04-22 DIAGNOSIS — G8929 Other chronic pain: Secondary | ICD-10-CM | POA: Diagnosis not present

## 2024-04-22 DIAGNOSIS — E611 Iron deficiency: Secondary | ICD-10-CM

## 2024-04-22 DIAGNOSIS — R634 Abnormal weight loss: Secondary | ICD-10-CM | POA: Diagnosis not present

## 2024-04-22 DIAGNOSIS — M549 Dorsalgia, unspecified: Secondary | ICD-10-CM | POA: Insufficient documentation

## 2024-04-22 DIAGNOSIS — Z79899 Other long term (current) drug therapy: Secondary | ICD-10-CM | POA: Diagnosis not present

## 2024-04-22 LAB — FERRITIN: Ferritin: 13 ng/mL (ref 11–307)

## 2024-04-22 LAB — BASIC METABOLIC PANEL - CANCER CENTER ONLY
Anion gap: 9 (ref 5–15)
BUN: 19 mg/dL (ref 8–23)
CO2: 20 mmol/L — ABNORMAL LOW (ref 22–32)
Calcium: 8.7 mg/dL — ABNORMAL LOW (ref 8.9–10.3)
Chloride: 106 mmol/L (ref 98–111)
Creatinine: 1.04 mg/dL — ABNORMAL HIGH (ref 0.44–1.00)
GFR, Estimated: 52 mL/min — ABNORMAL LOW (ref >60)
Glucose, Bld: 128 mg/dL — ABNORMAL HIGH (ref 70–99)
Potassium: 3.9 mmol/L (ref 3.5–5.1)
Sodium: 135 mmol/L (ref 135–145)

## 2024-04-22 LAB — CBC WITH DIFFERENTIAL (CANCER CENTER ONLY)
Abs Immature Granulocytes: 0.03 10*3/uL (ref 0.00–0.07)
Basophils Absolute: 0.1 10*3/uL (ref 0.0–0.1)
Basophils Relative: 1 %
Eosinophils Absolute: 0.4 10*3/uL (ref 0.0–0.5)
Eosinophils Relative: 5 %
HCT: 39.5 % (ref 36.0–46.0)
Hemoglobin: 12.6 g/dL (ref 12.0–15.0)
Immature Granulocytes: 0 %
Lymphocytes Relative: 24 %
Lymphs Abs: 2.1 10*3/uL (ref 0.7–4.0)
MCH: 27.7 pg (ref 26.0–34.0)
MCHC: 31.9 g/dL (ref 30.0–36.0)
MCV: 86.8 fL (ref 80.0–100.0)
Monocytes Absolute: 0.6 10*3/uL (ref 0.1–1.0)
Monocytes Relative: 7 %
Neutro Abs: 5.4 10*3/uL (ref 1.7–7.7)
Neutrophils Relative %: 63 %
Platelet Count: 346 10*3/uL (ref 150–400)
RBC: 4.55 MIL/uL (ref 3.87–5.11)
RDW: 12.9 % (ref 11.5–15.5)
WBC Count: 8.7 10*3/uL (ref 4.0–10.5)
nRBC: 0 % (ref 0.0–0.2)

## 2024-04-22 LAB — IRON AND TIBC
Iron: 46 ug/dL (ref 28–170)
Saturation Ratios: 12 % (ref 10.4–31.8)
TIBC: 370 ug/dL (ref 250–450)
UIBC: 324 ug/dL

## 2024-04-22 LAB — VITAMIN B12: Vitamin B-12: 1218 pg/mL — ABNORMAL HIGH (ref 180–914)

## 2024-04-22 MED ORDER — IRON SUCROSE 20 MG/ML IV SOLN
200.0000 mg | Freq: Once | INTRAVENOUS | Status: AC
  Administered 2024-04-22: 200 mg via INTRAVENOUS
  Filled 2024-04-22: qty 10

## 2024-04-22 MED ORDER — SODIUM CHLORIDE 0.9% FLUSH
10.0000 mL | Freq: Two times a day (BID) | INTRAVENOUS | Status: DC
  Administered 2024-04-22: 10 mL via INTRAVENOUS
  Filled 2024-04-22: qty 10

## 2024-04-22 NOTE — Progress Notes (Signed)
 I connected with Cynthia Bennett on 04/22/24 at  1:15 PM EDT by video enabled telemedicine visit and verified that I am speaking with the correct person using two identifiers.  I discussed the limitations, risks, security and privacy concerns of performing an evaluation and management service by telemedicine and the availability of in-person appointments. I also discussed with the patient that there may be a patient responsible charge related to this service. The patient expressed understanding and agreed to proceed.    Other persons participating in the visit and their role in the encounter: RN/medical reconciliation Patient's location: office Provider's location: home  Oncology History   No history exists.     Chief Complaint: anemia   History of present illness:Cynthia Bennett 89 y.o.  female with history of anemia/CKD stage III- is here for a follow up.  No blood in stools or black colored stools. Complains of on going fatigue. Patient not on PO iron .   Observation/objective: Alert & oriented x 3. In No acute distress.   Assessment and plan: Anemia due to stage 3b chronic kidney disease (HCC) # Anemia iron  def/CKD stageIII : Hb-8-9 [MCV- 77] s/p IV iron  infusion.    # hemoglobin to 12 - lower than baseline- proceed with venofer . #Recommend gentle iron  [iron  biglycinate; 28 mg ] 1 pill a day.  This pill is unlikely to cause stomach upset or cause constipation.   # Etiology: Unclear etiology.  GI versus CKD.  colo > 10 years ago; patient reluctant with GI procedures in the past. Recommended CT CAP; declineD imaging.    # CKD stage III-[GFR 57]-  stable.   # Constipation: ? Narcotics-defer to PCP/pain clinic-  stable.   # Chronic back pain/? scaitica [Dr.Neivera]- stable.   # weight loss- s/p dental extraction. S/p referral to nutrition- improved.   # DISPOSITION:  # proceed venofer   # venofer  in 2 weeks # follow up in 4  months- MD;labs- cbc/bmp/iron  studies/ferritin; b12 levels--  possible IV venofer - Dr.B  Cc; Dr.Hedrick    Follow-up instructions:  I discussed the assessment and treatment plan with the patient.  The patient was provided an opportunity to ask questions and all were answered.  The patient agreed with the plan and demonstrated understanding of instructions.  The patient was advised to call back or seek an in person evaluation if the symptoms worsen or if the condition fails to improve as anticipated.    Dr. Hedda Liv [facility] 04/22/2024 2:15 PM

## 2024-04-22 NOTE — Assessment & Plan Note (Addendum)
#   Anemia iron  def/CKD stageIII : Hb-8-9 [MCV- 77] s/p IV iron  infusion.    # hemoglobin to 12 - lower than baseline- proceed with venofer . #Recommend gentle iron  [iron  biglycinate; 28 mg ] 1 pill a day.  This pill is unlikely to cause stomach upset or cause constipation.   # Etiology: Unclear etiology.  GI versus CKD.  colo > 10 years ago; patient reluctant with GI procedures in the past. Recommended CT CAP; declineD imaging.    # CKD stage III-[GFR 57]-  stable.   # Constipation: ? Narcotics-defer to PCP/pain clinic-  stable.   # Chronic back pain/? scaitica [Dr.Neivera]- stable.   # weight loss- s/p dental extraction. S/p referral to nutrition- improved.   # DISPOSITION:  # proceed venofer   # venofer  in 2 weeks # follow up in 4  months- MD;labs- cbc/bmp/iron  studies/ferritin; b12 levels-- possible IV venofer - Dr.B  Cc; Dr.Hedrick

## 2024-04-22 NOTE — Patient Instructions (Signed)
#  Recommend gentle iron [iron biglycinate; 28 mg ] 1 pill a day.  This pill is unlikely to cause stomach upset or cause constipation.

## 2024-04-22 NOTE — Progress Notes (Signed)
 Patient said that she started feeling bad starting last weekend, 04/15/2024, she has been feeling very weak and fatigued, she has no appetite. She can hardly get around her house without having to stop and rest.

## 2024-05-06 ENCOUNTER — Inpatient Hospital Stay

## 2024-05-06 VITALS — BP 135/82 | HR 90 | Temp 97.1°F | Resp 18

## 2024-05-06 DIAGNOSIS — N1832 Chronic kidney disease, stage 3b: Secondary | ICD-10-CM | POA: Diagnosis not present

## 2024-05-06 DIAGNOSIS — E611 Iron deficiency: Secondary | ICD-10-CM

## 2024-05-06 MED ORDER — IRON SUCROSE 20 MG/ML IV SOLN
200.0000 mg | Freq: Once | INTRAVENOUS | Status: AC
  Administered 2024-05-06: 200 mg via INTRAVENOUS

## 2024-05-06 NOTE — Patient Instructions (Signed)

## 2024-05-23 ENCOUNTER — Inpatient Hospital Stay

## 2024-05-23 ENCOUNTER — Inpatient Hospital Stay: Admitting: Internal Medicine

## 2024-05-26 ENCOUNTER — Other Ambulatory Visit: Payer: Self-pay | Admitting: *Deleted

## 2024-05-26 DIAGNOSIS — E611 Iron deficiency: Secondary | ICD-10-CM

## 2024-05-26 DIAGNOSIS — N1832 Chronic kidney disease, stage 3b: Secondary | ICD-10-CM

## 2024-05-27 ENCOUNTER — Inpatient Hospital Stay: Attending: Internal Medicine

## 2024-05-27 DIAGNOSIS — Z79899 Other long term (current) drug therapy: Secondary | ICD-10-CM | POA: Diagnosis not present

## 2024-05-27 DIAGNOSIS — N1832 Chronic kidney disease, stage 3b: Secondary | ICD-10-CM | POA: Diagnosis not present

## 2024-05-27 DIAGNOSIS — D509 Iron deficiency anemia, unspecified: Secondary | ICD-10-CM | POA: Insufficient documentation

## 2024-05-27 DIAGNOSIS — D631 Anemia in chronic kidney disease: Secondary | ICD-10-CM | POA: Diagnosis not present

## 2024-05-27 DIAGNOSIS — E611 Iron deficiency: Secondary | ICD-10-CM

## 2024-05-27 LAB — CMP (CANCER CENTER ONLY)
ALT: 10 U/L (ref 0–44)
AST: 20 U/L (ref 15–41)
Albumin: 3.7 g/dL (ref 3.5–5.0)
Alkaline Phosphatase: 61 U/L (ref 38–126)
Anion gap: 8 (ref 5–15)
BUN: 12 mg/dL (ref 8–23)
CO2: 21 mmol/L — ABNORMAL LOW (ref 22–32)
Calcium: 9 mg/dL (ref 8.9–10.3)
Chloride: 108 mmol/L (ref 98–111)
Creatinine: 0.89 mg/dL (ref 0.44–1.00)
GFR, Estimated: >60 mL/min (ref >60)
Glucose, Bld: 133 mg/dL — ABNORMAL HIGH (ref 70–99)
Potassium: 3.7 mmol/L (ref 3.5–5.1)
Sodium: 137 mmol/L (ref 135–145)
Total Bilirubin: 0.6 mg/dL (ref 0.0–1.2)
Total Protein: 6.6 g/dL (ref 6.5–8.1)

## 2024-05-27 LAB — CBC WITH DIFFERENTIAL (CANCER CENTER ONLY)
Abs Immature Granulocytes: 0.04 K/uL (ref 0.00–0.07)
Basophils Absolute: 0.1 K/uL (ref 0.0–0.1)
Basophils Relative: 1 %
Eosinophils Absolute: 0.3 K/uL (ref 0.0–0.5)
Eosinophils Relative: 4 %
HCT: 38 % (ref 36.0–46.0)
Hemoglobin: 11.4 g/dL — ABNORMAL LOW (ref 12.0–15.0)
Immature Granulocytes: 1 %
Lymphocytes Relative: 23 %
Lymphs Abs: 1.8 K/uL (ref 0.7–4.0)
MCH: 27.5 pg (ref 26.0–34.0)
MCHC: 30 g/dL (ref 30.0–36.0)
MCV: 91.6 fL (ref 80.0–100.0)
Monocytes Absolute: 0.7 K/uL (ref 0.1–1.0)
Monocytes Relative: 8 %
Neutro Abs: 5.1 K/uL (ref 1.7–7.7)
Neutrophils Relative %: 63 %
Platelet Count: 292 K/uL (ref 150–400)
RBC: 4.15 MIL/uL (ref 3.87–5.11)
RDW: 15 % (ref 11.5–15.5)
WBC Count: 8 K/uL (ref 4.0–10.5)
nRBC: 0 % (ref 0.0–0.2)

## 2024-05-27 LAB — IRON AND TIBC
Iron: 25 ug/dL — ABNORMAL LOW (ref 28–170)
Saturation Ratios: 9 % — ABNORMAL LOW (ref 10.4–31.8)
TIBC: 267 ug/dL (ref 250–450)
UIBC: 242 ug/dL

## 2024-05-27 LAB — SAMPLE TO BLOOD BANK

## 2024-05-27 LAB — FERRITIN: Ferritin: 87 ng/mL (ref 11–307)

## 2024-05-31 ENCOUNTER — Telehealth: Payer: Self-pay | Admitting: Internal Medicine

## 2024-05-31 NOTE — Telephone Encounter (Signed)
 Pt daughter called.   Stated pt had done labs and she wants to know what those lab results are and if pt will need iron .   Please advise and call pt daughter back. Thank you

## 2024-06-01 ENCOUNTER — Inpatient Hospital Stay

## 2024-06-01 VITALS — BP 122/57 | HR 70 | Temp 98.7°F | Resp 18

## 2024-06-01 DIAGNOSIS — E611 Iron deficiency: Secondary | ICD-10-CM

## 2024-06-01 DIAGNOSIS — N1832 Chronic kidney disease, stage 3b: Secondary | ICD-10-CM | POA: Diagnosis not present

## 2024-06-01 MED ORDER — IRON SUCROSE 20 MG/ML IV SOLN
200.0000 mg | Freq: Once | INTRAVENOUS | Status: AC
  Administered 2024-06-01: 200 mg via INTRAVENOUS
  Filled 2024-06-01: qty 10

## 2024-06-01 MED ORDER — SODIUM CHLORIDE 0.9% FLUSH
10.0000 mL | Freq: Once | INTRAVENOUS | Status: AC | PRN
  Administered 2024-06-01: 10 mL
  Filled 2024-06-01: qty 10

## 2024-06-15 ENCOUNTER — Inpatient Hospital Stay: Attending: Internal Medicine

## 2024-06-15 VITALS — BP 129/61 | HR 78 | Temp 97.5°F | Resp 16

## 2024-06-15 DIAGNOSIS — D631 Anemia in chronic kidney disease: Secondary | ICD-10-CM | POA: Insufficient documentation

## 2024-06-15 DIAGNOSIS — E611 Iron deficiency: Secondary | ICD-10-CM

## 2024-06-15 DIAGNOSIS — Z79899 Other long term (current) drug therapy: Secondary | ICD-10-CM | POA: Diagnosis not present

## 2024-06-15 DIAGNOSIS — N1832 Chronic kidney disease, stage 3b: Secondary | ICD-10-CM | POA: Diagnosis not present

## 2024-06-15 MED ORDER — IRON SUCROSE 20 MG/ML IV SOLN
200.0000 mg | Freq: Once | INTRAVENOUS | Status: AC
  Administered 2024-06-15: 200 mg via INTRAVENOUS
  Filled 2024-06-15: qty 10

## 2024-06-15 NOTE — Patient Instructions (Signed)
 Iron Sucrose Injection What is this medication? IRON SUCROSE (EYE ern SOO krose) treats low levels of iron (iron deficiency anemia) in people with kidney disease. Iron is a mineral that plays an important role in making red blood cells, which carry oxygen from your lungs to the rest of your body. This medicine may be used for other purposes; ask your health care provider or pharmacist if you have questions. COMMON BRAND NAME(S): Venofer What should I tell my care team before I take this medication? They need to know if you have any of these conditions: Anemia not caused by low iron levels Heart disease High levels of iron in the blood Kidney disease Liver disease An unusual or allergic reaction to iron, other medications, foods, dyes, or preservatives Pregnant or trying to get pregnant Breastfeeding How should I use this medication? This medication is for infusion into a vein. It is given in a hospital or clinic setting. Talk to your care team about the use of this medication in children. While this medication may be prescribed for children as young as 2 years for selected conditions, precautions do apply. Overdosage: If you think you have taken too much of this medicine contact a poison control center or emergency room at once. NOTE: This medicine is only for you. Do not share this medicine with others. What if I miss a dose? Keep appointments for follow-up doses. It is important not to miss your dose. Call your care team if you are unable to keep an appointment. What may interact with this medication? Do not take this medication with any of the following: Deferoxamine Dimercaprol Other iron products This medication may also interact with the following: Chloramphenicol Deferasirox This list may not describe all possible interactions. Give your health care provider a list of all the medicines, herbs, non-prescription drugs, or dietary supplements you use. Also tell them if you smoke,  drink alcohol, or use illegal drugs. Some items may interact with your medicine. What should I watch for while using this medication? Visit your care team regularly. Tell your care team if your symptoms do not start to get better or if they get worse. You may need blood work done while you are taking this medication. You may need to follow a special diet. Talk to your care team. Foods that contain iron include: whole grains/cereals, dried fruits, beans, or peas, leafy green vegetables, and organ meats (liver, kidney). What side effects may I notice from receiving this medication? Side effects that you should report to your care team as soon as possible: Allergic reactions--skin rash, itching, hives, swelling of the face, lips, tongue, or throat Low blood pressure--dizziness, feeling faint or lightheaded, blurry vision Shortness of breath Side effects that usually do not require medical attention (report to your care team if they continue or are bothersome): Flushing Headache Joint pain Muscle pain Nausea Pain, redness, or irritation at injection site This list may not describe all possible side effects. Call your doctor for medical advice about side effects. You may report side effects to FDA at 1-800-FDA-1088. Where should I keep my medication? This medication is given in a hospital or clinic. It will not be stored at home. NOTE: This sheet is a summary. It may not cover all possible information. If you have questions about this medicine, talk to your doctor, pharmacist, or health care provider.  2024 Elsevier/Gold Standard (2023-04-03 00:00:00)

## 2024-07-12 ENCOUNTER — Ambulatory Visit: Attending: Nurse Practitioner | Admitting: Nurse Practitioner

## 2024-07-12 ENCOUNTER — Encounter: Payer: Self-pay | Admitting: Nurse Practitioner

## 2024-07-12 DIAGNOSIS — G894 Chronic pain syndrome: Secondary | ICD-10-CM | POA: Insufficient documentation

## 2024-07-12 DIAGNOSIS — M4316 Spondylolisthesis, lumbar region: Secondary | ICD-10-CM | POA: Insufficient documentation

## 2024-07-12 DIAGNOSIS — M25551 Pain in right hip: Secondary | ICD-10-CM | POA: Diagnosis not present

## 2024-07-12 DIAGNOSIS — M79605 Pain in left leg: Secondary | ICD-10-CM | POA: Diagnosis not present

## 2024-07-12 DIAGNOSIS — M25552 Pain in left hip: Secondary | ICD-10-CM | POA: Insufficient documentation

## 2024-07-12 DIAGNOSIS — Z79891 Long term (current) use of opiate analgesic: Secondary | ICD-10-CM | POA: Insufficient documentation

## 2024-07-12 DIAGNOSIS — G8929 Other chronic pain: Secondary | ICD-10-CM | POA: Diagnosis not present

## 2024-07-12 DIAGNOSIS — Q7649 Other congenital malformations of spine, not associated with scoliosis: Secondary | ICD-10-CM | POA: Diagnosis not present

## 2024-07-12 DIAGNOSIS — M5135 Other intervertebral disc degeneration, thoracolumbar region: Secondary | ICD-10-CM | POA: Insufficient documentation

## 2024-07-12 DIAGNOSIS — M545 Low back pain, unspecified: Secondary | ICD-10-CM | POA: Diagnosis not present

## 2024-07-12 DIAGNOSIS — M4155 Other secondary scoliosis, thoracolumbar region: Secondary | ICD-10-CM | POA: Diagnosis not present

## 2024-07-12 DIAGNOSIS — M5416 Radiculopathy, lumbar region: Secondary | ICD-10-CM | POA: Diagnosis not present

## 2024-07-12 DIAGNOSIS — Z79899 Other long term (current) drug therapy: Secondary | ICD-10-CM | POA: Insufficient documentation

## 2024-07-12 DIAGNOSIS — M48062 Spinal stenosis, lumbar region with neurogenic claudication: Secondary | ICD-10-CM | POA: Insufficient documentation

## 2024-07-12 DIAGNOSIS — M47816 Spondylosis without myelopathy or radiculopathy, lumbar region: Secondary | ICD-10-CM | POA: Insufficient documentation

## 2024-07-12 MED ORDER — OXYCODONE HCL 5 MG PO TABS: 5.0000 mg | ORAL_TABLET | Freq: Every day | ORAL | 0 refills | Status: DC | PRN

## 2024-07-12 NOTE — Patient Instructions (Addendum)
 ______________________________________________________________________    Procedure instructions  Stop blood-thinners  Do not eat or drink fluids (other than water) for 6 hours before your procedure  No water for 2 hours before your procedure  Take your blood pressure medicine with a sip of water  Arrive 30 minutes before your appointment  If sedation is planned, bring suitable driver. [place], [place], & public transportation are NOT APPROVED)  Carefully read the Preparing for your procedure detailed instructions  If you have questions call us  at [phone]  Procedure appointments are for procedures only.   NO medication refills or new problem evaluations will be done on procedure days.   Only the scheduled, pre-approved procedure and side will be done.   ______________________________________________________________________     ______________________________________________________________________    Update on Controlled Substance (Opioid) Regulations   To: All patients taking opioid pain medications. (I.e.: hydrocodone , hydromorphone, oxycodone , oxymorphone, morphine, codeine, methadone, tapentadol, tramadol, buprenorphine, fentanyl , etc.)  Re: Review on the state of controlled substance regulations.  Introduction: Rules and regulations associated with all aspects of controlled substances are constantly being modified. Unfortunately we have encountered patients questioning the veracity of the information that we provide them about these changes. This is intended to provide them with appropriate references and a historical review of these changes.  A Brief History: As of August 25, 2016, the [place]  Government declared the opioid epidemic a public health emergency. Prescription drug monitoring programs (PDMPs) and the [facility] All Schedules Prescription Electronic Reporting Act (NASPER). Before 1800, clinicians regarded pain as an existential phenomenon, a consequence of  aging. There was no regulation on the use of cocaine and opioids, resulting in widespread marketing and prescribing for many ailments ranging from diarrhea to toothache. The [employer] of [phone], passed in response to the sudden emergence of street heroin abuse as well as iatrogenic morphine dependence, influenced both physician and patient alike to avoid opiates. Patients with unexplained pain in the 1920s were regarded as deluded, malingering, or abusers, and cancer patients through the 1950s were encouraged to wean themselves off opioids until their lives "could be measured in weeks". Alongside this opioid evolution, the American Pain Society launched their influential "pain as the fifth vital sign" campaign in 1995. Concurrently, pharmaceutical companies introduced new formulations, such as extended release oxycodone  (OxyContin ). From 1997 to 2002, OxyContin  prescriptions increased from 670,000 to 6.2 million. However, concerns soon began to surface regarding overzealous opioid treatment. It must be noted that pharmaceutical companies contributed significantly to the rise of the opioid epidemic, receiving considerable reprimands as a consequence. In 2007, as the opioid epidemic began to inflict profound damage, [employer] pleaded guilty to federal charges related to the misbranding of OxyContin . [employer] agreed to pay a total of $634.5 million to resolve Justice Department investigations, as well as a $19.5 million settlement to [address] and the [address] of [place].  In response to the current epidemic, changes in focus to the development of new abuse deterrent opioid formulations at the [place]  Food and Drug Administration (FDA) as well as drafting of new public standards for pain treatment were created at [facility] in 2017. In response to the opioid epidemic, FDA public policy changes were announced in February 2016. Among these new positions were a re-examination of the risk-benefit paradigm for  opioids with strict emphasis on the large public health ramifications. The various modified opioids released over the past 20 years, such as tamper-resistant preparation, have had differing levels of success, and are collectively referred to as  Risk Evaluation and Mitigation Strategies (REMS). There is also a growing focus on preventing opioid use disorder (OUD) and on offering affected individuals accessible and effective treatment. [place]  government policy reflects these changes and both the Affordable Care Act and the Mental Health Parity and Addiction Equity Act were major steps forward in treating opioid addiction. The Affordable Care Act, which was signed into law in 07-30-09, with major provisions coming into effect by 07/30/2013.  In the 1990s, the intensified marketing of newly reformulated prescription opioid medications (e.g., OxyContin ) and an influential pain advocacy campaign that encouraged greater pain management led to a precipitous rise in opioid use in the [place] . Research from the Centers for Disease Control and Prevention (CDC) shows that prescription opioid sales in the [place]  quadrupled from 1998/07/30 to 07-30-2009. At the same time, opioid misuse and opioid-involved overdose deaths increased (Figure 1). Between July 30, 1998 and July 30, 2009, the rate of opioidinvolved overdose deaths in the [place]  doubled from 2.9 to 6.8 deaths per 100,000 people. This initial rise in opioid-related deaths is often referred to as the first wave of the recent opioid crisis.  Between 07/30/1998 and 07-31-19, 565,000 Americans died of opioid-involved overdoses. In turn, federal, state, and local governments responded with various legal and policy efforts to curb opioid misuse and drug-related overdose Deaths.  Recent Congresses have enacted several laws addressing the opioid crisis, such as the Comprehensive Addiction and Recovery Act of 2015/07/31 (CARA, P.L. [phone]); the 21st Century Cures Act (P.L. 114-255); the  Substance UseDisorder Prevention that Promotes Opioid Recovery and Treatment for Patients and Communities Act (SUPPORT Act, P.L. 304-207-3251); the Fentanyl  Sanctions Act (Title LXXII of P.L. [id number]); and the Blocking Deadly Fentanyl  Imports Act (P.L. 117-81, 6610). These laws addressed overprescribing and misuse of opioids, expanded substance use disorder prevention and treatment capacities, bolstered drug diversion capabilities, and enhanced international drug interdiction, counternarcotics cooperation, and sanctions efforts. Congress also directed additional funds to many of these initiatives through appropriations.  Congress provided funding in the [employer] Act of 2020/07/30 [phone]; P.L. 117-2) for syringe services programs (often known as needle exchange programs) and other harm reduction initiatives. Federal and state harm reduction strategies have frequently involved the distribution of naloxone  (e.g., Narcan )--a medication used to reverse an opioid overdose--and test strips used to detect fentanyl  in drug samples.  The Department of Justice (DOJ) and Department of Homeland Security [facility]) aim to reduce the diversion of prescription opioids and the use, manufacturing, and trafficking of illicit opioids. DOJ--via the Drug Enforcement Administration (DEA)--regulates opioid manufacturers, distributors, and dispensers; it also controls the opioid supply through enforcement of regulatory requirements.  A History of Opiate Laws in the [place]   Prior to Jul 30, 1889, laws concerning opiates were strictly imposed on a local city or state-by-state basis. One of the first was in [place] in 07-30-74 where it became illegal to smoke opium only in opium dens. It did not ban the sale, import or use otherwise. In the next 25 years different states enacted opium laws ranging from outlawing opium dens altogether to making possession of opium, morphine and heroin without a physician's  prescription illegal.  The first Congressional Act took place in Jul 30, 1889 that levied taxes on morphine and opium. From that time on the [employer] has had a series of laws and acts directly aimed at opiate use, abuse and control. These are outlined below:  1906 - Pure Food and Drug Act Preventing the manufacture, sale, or transportation of adulterated or misbranded or poisonous  or deleterious foods, drugs, medicines, and liquors, and for regulating traffic therein, and for other purposes. Punishment included fines and prison time.  1909   - Smoking Opium Exclusion Act Banned the importation, possession and use of smoking opium. Did not regulate opium-based medications. First Freight forwarder banning the non-medical use of a substance.  1914  - The Margrette Act In summary, The Margrette Act of 1914 was written more to have all parties involved in importing, exporting, Set designer and distributing opium or cocaine to register with the [employer] and have taxes levied upon them. Exempt from the law were physicians operating "in the course of his professional practice"  1919 - Supreme Court ratified the [employer] in [place] et al., v. [place]  and [place]  v. [place], then again in [facility] v. [place] , in 1920, holding that doctors may not prescribe maintenance supplies of narcotics to people addicted to narcotics. However, it does not prohibit doctors from prescribing narcotics to wean a patient off of the drug. It was also the opinion of the court that prescribing narcotics to habitual users was not considered "professional practice" hence it then was considered illegal for doctors to prescribe opioids for the purposes of maintaining an addiction. It can be argued that today's addiction medications are not intended to maintain an addiction but to facilitate addiction remission. In which case, this opinion of the court should not preclude practitioners from  prescribing buprenorphine or methadone to patients suffering from an addictive disorder.  1924  - Heroin Act Architectural technologist, importation and possession of heroin illegal - even for medicinal use.  1922 -- Narcotic Drug Import and Export Act Enacted to assure proper control of importation, sale, possession, production and consumption of narcotics.  1927  -- Special educational needs teacher of Prohibition [employer] of Prohibition was responsible for tracking bootleggers and organized Conservation officer, historic buildings. They focused primarily on interstate and international cases and those cases where local law enforcement official would not or could not act.  1932 -- Uniform State Narcotic Act Encouraged states to pass uniform state laws matching the federal Narcotic Drug Import and Export Act. Suggested prohibiting cannabis use at the state level.  40 -- Food, Drug, and Cosmetic Act The new law brought cosmetics and medical devices under control, and it required that drugs be labeled with adequate directions for safe use. Moreover, it mandated pre-market approval of all new drugs, such that a manufacturer would have to prove to FDA that a drug were safe before it could be sold  1951 -- Boggs Act Imposed maximum criminal penalties for violations of the import/export and internal revenue laws related to drugs and also established mandatory minimum prison sentences.  1956 -- Narcotics Control Act Increased Boggs Act penalties and mandatory prison sentence minimums for violations of existing drug laws.  1965 -- Drug Abuse Control Amendment Enacted to deal with problems caused by abuse of depressants, stimulants and hallucinogens. Restricted research into psychoactive drugs such as LSD by requiring FDA approval.  1970 -- Controlled Substance Act  Controlled Substances Import and Export Act These laws are a consolidation of numerous laws regulating the manufacture and distribution of narcotics, stimulants, depressants,  hallucinogens, anabolic steroids, and chemicals used in the illicit production of controlled substances. The CSA places all substances that are regulated under existing federal law into one of five schedules. This placement is based upon the substance's medicinal value, harmfulness, and potential for abuse or addiction. Schedule I is reserved for the most dangerous  drugs that have no recognized medical use, while Schedule V is the classification used for the least dangerous drugs. The act also provides a mechanism for substances to be controlled, added to a schedule, decontrolled, removed from control, rescheduled, or transferred from one schedule to another.  68 - Drug Enforcement Agency By Executive Order, the DEA was formed to take place of the [employer] of Narcotics and Dangerous Drugs.  5 -Narcotic Addict Treatment Act of  1974  - Public Law [phone] Amends the Controlled Substance Act of 1970 to provide for the registration of practitioners conducting narcotic treatment programs. [methadone clinics] It also provides legal definitions for the phrases "maintenance treatment" and "detoxification treatment".  1986 -- Anti-Drug Abuse Act of 1986 Strengthened Federal efforts to encourage foreign cooperation in eradicating illicit drug crops and in halting international drug traffic, to improve enforcement of Federal drug laws and enhance interdiction of illicit drug shipments, to provide strong Market researcher in establishing effective drug abuse prevention and education programs, to [employer] support for drug abuse treatment and rehabilitation efforts, and for other purposes. It also re-imposed mandatory sentencing minimums depending on which drug and how much was involved.  1988 -- Anti-Drug Abuse Act of 1988 Established the Office of Materials engineer (ONDCP) in the [employer] of the Economist; authorized funds for [employer], state and local drug enforcement activities,  school-based drug prevention efforts, and drug abuse treatment with special emphasis on injecting drug abusers at high risk for AIDS.  2000 -- Federal - The Drug Addiction Treatment Act of 2000 (DATA 2000) It enables qualified physicians to prescribe and/or dispense narcotics for the purpose of treating opioid dependency. For the first time, physicians are able to treat this disease from their private offices or other clinical settings. This presents a very desirable treatment option for those who are unwilling or unable to seek help in drug treatment clinics. Patients can now be treated in the privacy of their doctor's office, as are other people being treated for any other type of medical condition. One medicine doctors may now prescribe is Buprenorphine. The major downfall of this Act is the limitation of 30 patients per practice - which means that large facilities, no matter how many physicians are there, can only treat 30 patients at a time.  2002-- DEA reschedules buprenorphine from a schedule V drug to a schedule III drug, on August 16, 2001 - the day before the FDA approval of Suboxone and Subutex despite overwhelming objection by the medical community.  2004: June 2004 THE CONFIDENTIALITY OF ALCOHOL AND DRUG ABUSE PATIENT RECORDS REGULATION AND THE HIPAA PRIVACY RULE:  Confidentiality of Alcohol and Drug Dependence Patient Records (summary) Code of Federal Regulations Title 42 Part 2 (42 CFR Part 2)  The confidentiality of alcohol and drug dependence patient records maintained by this practice/program is protected by federal law and regulations. Generally, the practice/program may not say to a person outside the practice/program that a patient attends the practice/program, or disclose any information identifying a patient as being alcohol or drug dependent unless:  The patient consents in writing; The disclosure is allowed by a court order, or The disclosure is made to medical personnel in a  medical emergency or to qualified personnel for research,  audit, or practice/program evaluation. Violation of the federal law and regulations by a practice/program is a crime. Suspected violations may be reported to appropriate authorities in accordance with federal regulations. Freight forwarder and regulations do not protect any information about a  crime committed by a patient either at the practice/program or against any person who works for the practice/program or about any threat to commit such a crime. Federal laws and regulations do not protect any information about suspected child abuse or neglect from being reported under state law to appropriate state or local authorities.  sample consent form (MS-WORD)  2005: 06-11-2004 Public law [phone], Amends the Controlled Substances Act to eliminate the 30-patient limit for medical group practices allowed to dispense narcotic drugs in schedules III, IV, or V for maintenance or detoxification treatment (retains the 30-patient limit for an individual physician). This amendment removes the 30-patient limit on group medical practices that treat opioid dependence with buprenorphine. The restriction was part of the original Drug Addiction Treatment Act of 2000 (DATA) that allowed treatment of opioid dependence in a doctor's office. With this change, every certified doctor may now prescribe buprenorphine up to his or her individual physician limit of 30 patients.  2006: On 11/07/2005 President Levy signed Bill H.R.6344 into law. This allows physicians who have been certified to prescribe certain drugs for the treatment of opioid dependence under DATA2000 to treat up to 100 patients (up from 30) by submitting an intent notification to the Dept of Health and [employer]. This is a major step forward in both fighting the stigma and allowing access to treatment previously not available to some. For more details see 30/100-PATIENT LIMIT  2016: HHS augments regulations  concerning the 30/100 patient limit by raising the limit to 275 for qualifying physicians. Link to summary of regulation  2016: Comprehensive Addiction and Recovery Act of 2016 (sec.303) amends the Controlled Substance Act - to allow Nurse Practitioners and Physician Assistants to become eligible to prescribe buprenorphine for the treatment of opioid use disorder. See the entire law for more details.  The roots of the concurrent regulation of certain drugs under two statutory schemes go back to the beginning of this century. In 1906, Congress enacted the Pure Food and Drug Act, establishing one regime of regulation to assure (among other things) that drugs were not adulterated or misbranded. These regulations were amended several times, recodified in 1938, and expanded on again from the 1940s through the 1990s. Their implementation and enforcement is today assigned to the Food and Drug Administration (FDA) in the Department of Health and Human Services [facility]).  In 1914, Congress adopted the [place] Narcotic Act to stop abuse of addictive drugs. The Margrette Narcotic Act was amended in 1937 to include marijuana. In 1965, amphetamines, barbiturates, and hallucinogens came under regulation, but under the [employer], Drug, and Cosmetic Act. In 1970, these various statutes were consolidated and recodified as the Controlled Substances Act (CSA), which has been amended several times since then. Its implementation and enforcement is today assigned to the Drug Enforcement Administration (DEA) in the Department of Justice.  The first clash occurred after World War I, when so-called morphine clinics existed and physicians prescribed or dispensed morphine to addicts. Some addicts were veterans of the American Civil War, the Spanish-American War, and WWI, who had become addicted during treatment for war wounds, but most of them came from the growing population of nonmedical addicts ([place], 8017). The Narcotics  Division of the Prohibition Unit of the Department of the Treasury, which was then responsible for enforcing the [facility] Narcotic Act, concluded that this activity was not the legitimate practice of medicine but simple drug trafficking. The Treasury Department swiftly closed the clinics and made it personally and professionally risky for  physicians to maintain a narcotic addict for any reason. In did so, however, only after the American Medical Association had adopted a resolution, in 1920, opposing ambulatory clinics''.  In 1972, the public health establishment, including the Secretary of Health, Education, and Welfare, the Education officer, environmental, the [employer] of [employer], and the Chemical engineer for Drug Abuse Prevention, was unprepared to allow [employer] of Narcotics and Dangerous Drugs, DEA's predecessor agency, to unilaterally define the parameters of medical practice for the use of methadone in the treatment of heroin addiction. As a consequence, a new set of rules--the third, on top of the FDA and DEA schemes--was added, one that inserted FDA deeply into the practice of medicine, notwithstanding its protestations to the contrary. Congress ratified this joint responsibility of law enforcement and public health officials for methadone through this third set of rules in 1974 with the passage of the Narcotic Addict Treatment Act (NATA). To examine in detail the evolution of this third set of rules--commonly referred to as the FDA or DHHS methadone regulations--we turn, first, to the period of the mid-1960s.  Increased use of heroin in the post World War II period first became apparent in the early to mid 1950s. During the [employer], a minimum mandatory narcotics law was enacted in 1956, effective July 1957. 1962 [facility] conference on drug abuse, the [employer] on Narcotic and Drug Abuse (the [employer]) of 1963, the Drug Abuse  Control Amendments of 1965, the President's Commission on [employer] and Administration of Justice (the [employer]) of [phone], and the Narcotic Addiction Rehabilitation Act of 1966.  The 1965 Drug Abuse Control Amendments brought under strict federal control all nonnarcotic drugs capable of producing serious psychotoxic effects when abused. This act also created the [employer] of Drug Abuse Control within the Department of Health, Education, and Welfare (DHEW) and shifted the basis for [employer] of illegal drugs from tax principles (administered by the Department of Treasury) to the regulation of commerce (administered by the [employer]).  The 1966 Narcotic Addiction Rehabilitation Act Tour manager) authorized the civil commitment of narcotic addicts, and federal assistance to state and local governments to develop a local system of drug treatment programs. With respect to the latter, the [employer] of Mental Health [facility]) initially proposed the gradual implementation of the state assistance effort, mainly through a common mental health mechanism--inpatient treatment programs. However, because of a perceived pressing need, the courts began to commit addicts to these programs even before they were officially opened or staffed. The NARA legislation imposed the following contract requirements on treatment centers: (1) thrice-a-week counseling sessions; (2) weekly urine tests; (3) restorative dental services; (4) psychological consultations and vocational training; and (5) the treatment modalities of drug-free outpatient, therapeutic community, and methadone maintenance. Reorganization Plan No. 1 of 1968 transferred the primary functions of the [employer] of Narcotics (FBN) from the [employer] to the Department of Justice; it also transferred the [employer] of Drug Abuse Control functions to the Department of Justice. Within the [employer], the [employer] of Narcotics and  Dangerous Drugs (BNDD) was created, which became the Drug Enforcement Administration in 1973.   Under the first [place] administration [phone]), federal drug abuse policy developed in a significant way. These developments included a 1969 war on drugs presidential message, resulting legislation in 1970, and a Special Action Office created by executive order in 1971 and authorized in statute in 1972. Brynn, in 1969, to send a message  to Congress on drug abuse. Although this was the first time that a U.S. president invoked the war on drugs image, it was in retrospect the most balanced approach to the problem of drug abuse that had been advanced. The 1969 message resulted in the submission of legislation to the Congress and the passage, the following year, of the Comprehensive Drug Abuse Prevention and Control Act of 1970 [employer] [phone], September 05, 1969). The act dealt with research, treatment, and prevention of drug abuse and drug dependence, and with drug abuse Charity fundraiser. One major purpose of the 1970 legislation was to reverse some of the strictures of the [employer] of 1914. The 1970 act sought to clarify for the medical profession . . . the extent to which they may safely go in treating narcotic addicts as patients. Title I, in Section IV, charged the Surveyor, minerals, Education, and Welfare, to determine the appropriate methods of professional practice in the medical treatment of the narcotic addiction of various classes of narcotic addicts. This provision constitutes the initial statutory basis for treatment standards. The law enforcement sections consolidated all prior federal statutes into the Controlled Substances Act and the Controlled Substances Export and Import Act (Titles II and III, respectively, of the Comprehensive Drug Abuse Prevention and Control Act of 1970). Under this legislation, substances were classified under five schedules according to their abuse  potential, and psychological and physical effects. Methadone was placed in Schedule II, along with such opiate drugs as morphine, codeine, and hydrocodone .  One of the most important steps taken by President Brynn was to establish in June 1971 the Special Action Office for Drug Abuse Prevention (SAODAP) in the [employer] of the President ([place] [phone], April 26, 1970). In mid-1971, [facility] appointed Dr. Maple Dunnings as SAODAP director. Within a year, the Drug Abuse Prevention Office and Treatment Act of 1972 [employer] [phone], January 29, 1971) gave statutory authority to [facility], but limiting setting, on May 09, 1974, as the limit on its existence.  The purpose of the 1972 act was to bring the resources of the federal government to bear on drug abuse with the immediate objective of significantly reducing its incidence and developing a comprehensive, coordinated long-term federal strategy to combat drug abuse.  Narcotic Addict Treatment Act [employer]) of 1974 [employer] [phone]), which amended the Controlled Substances Act. This legislation was driven by concern for the diversion of methadone to illicit channels that was occurring in 1972 and 1973, as reflected in the title of the Senate bill adopted on April 17, 1972, the Methadone Diversion Control Act of 1973. (U.S. Senate, 1970a, 8029a).  The 1980 final rule (45 FR 37305, July 30, 1979) reduced the minimum standard for admission from two years of addiction to one year coupled with a clinical determination that the individual was currently physiologically.  The regulations were next revised in 1989, following two proposals to modify them, one in 1983 and one in 1987.  Under President Tanda Corrente, a government-wide effort was made to review all federal government regulations and to eliminate or reduce the burden of these regulations on the private sector, state and [employer], and [employer].   The 1983  recommendations, though not adopted, did initiate another revision of the methadone regulations, which first found expression in a 1987 proposed rule (52 FR 37047, August 11, 1986) and culminated in a final rule (54 FR 8954, January 10, 1988) at the end of the decade. In the 1987  proposed rule, the FDA and NIDA, in an effort to put the best face on the unenthusiastic 1983 response by the provider community to converting the regulations to guidelines, indicated that they had retained the current requirements necessary to achieve the goals of the 1974 NATA, but were proposing to streamline the regulation and to promote more efficient operation of methadone programs. The 1987 proposed rule, issued by the FDA and NIDA, advanced the following changes in the methadone regulation: that detoxification treatment be divided into short-term (<21 days) and long-term (>21 and <180 days) treatment; that the minimum staffing ratio of one counselor to 50 patients be eliminated; that blood tests be allowed as ways to conduct initial drug screening or to meet the monthly testing requirements for six-day take-home patients; that the 72-hour notification of FDA and the pertinent state authority for methadone doses greater than 100 mg be eliminated; that special adverse reaction reporting requirements for methadone be eliminated and reliance placed upon general FDA reporting requirements; that a supervising counselor be allowed to conduct the annual review of the patient's treatment plan for certain qualified patients who had been in treatment for 3 years or longer; and that the requirement of an annual report of methadone treatment programs to the FDA be dropped. The FDA and NIDA issued a final rule on January 10, 1988, based on comments on the 1987 proposed (54 FR 8954). Concurrently, FDA and NIDA issued a six-page guidance document, which noted that the regulations, over time, had recommended certain practices that were not  actually required. Public Health Service, in Congress, and elsewhere, to reorganize the Alcohol, Drug Abuse, and Mental Health Administration (ADAMHA). These efforts culminated in the [employer] of 1992 [employer] [phone], May 20, 1991), the main purpose of which was to transfer the research portions of the three ADAMHA institutes--NIDA, the [employer] of Alcoholism and Alcohol Abuse, and the [employer] of Mental Health--to the [employer] and to create the Substance Abuse and Museum/gallery exhibitions officer [facility]) as the home for the service functions of these entitles.  Guidelines for Opioid Treatment The Federal Guidelines for Opioid Treatment Programs - 2015 serve as a guide to accrediting organizations for developing accreditation standards. The guidelines also provide OTPs with information on how programs can achieve and maintain compliance with federal regulations. The 2015 guidelines are an update to the 2007 Guidelines for the Accreditation of Opioid Treatment Programs (PDF  547 KB). The new document reflects the obligation of OTPs to deliver care consistent with the patient-centered, integrated, and recovery-oriented standards of substance use treatment.  DPT oversees the certification of OTPs and provides guidance to nonprofit organizations and state governmental entities that want to become a SAMHSA-approved accrediting body. Learn more about the accreditation and certification of OTPs and [employer] oversight of OTP accreditation bodies.  Model Guidelines for [facility] Boards With input from [facility], the Federation of [employer] in 2013 adopted a revised version of the federation's office-based opioid treatment policies. The Model Policy on DATA 2000 and Treatment of Opioid Addiction in the Medical Office - 2013 (PDF  279 KB) provides model guidelines for use by state medical boards in regulating office-based opioid  treatment.  Holiday Guidance for Opioid Treatment Programs (PDF  203 KB) In response to requests for the upcoming federal holidays and ensuing weekends (December 24th, 25th, and 26th and December 31st, Jan 1st, and Jan 2nd), this letter is to provide guidance regarding requests for unsupervised doses of medication  for patients for these dates. View a sample [id number] (PDF  194 KB).  Federal regulation of drugs emerged as early as 31, under a law that addressed only imported drugs. In 1905 the [employer] launched a private, voluntary means of controlling a substantial part of the drug marketplace, a system that remained in place for over a half-century. Drug regulation in FDA has evolved considerably since President Ricardo Para signed the 1906 Pure Food and Drugs Act.  1820 Eleven doctors set up the U.S. Pharmacopeia and record the first list of standard drugs. 1848 Drug Importation Act passed by Congress requires U.S. Customs Service inspection to stop entry of tainted, low quality drugs from overseas. 8116 Dr. Mitchell MICAEL Burrs becomes the chief chemist at the [facility] [place] adulteration studies.  1905 The American Medical Association [facility]) begins a voluntary program of drug approval that would last until 1955. In order to advertise in the [facility] and related journals, drug companies must show proof that the drug will treat what they claim. 1906 The original Food and Drug Act is passed by Congress on June 30 and signed by [employer]. The Act outlaws states from buying and selling food, drinks, and drugs that have been mislabeled and tainted. 1911 In U.S. v. Vicci, the [employer] that the [employer] and Drugs Act does not outlaw false medical claims but only false and misleading statements about the ingredients or identity of a drug. 1912 Congress passes the [place] Amendment to overcome the ruling in U.S. v. Vicci. The Act  outlaws labeling medicines with fake medical claims that is meant to trick the buyer. 1930 The name of the Food, Drug, and Insecticide Administration is shortened to Food and Drug Administration (FDA) under an Therapist, music. 1933 FDA recommends a total rewrite of the out-of-date 1906 Food and Drugs Act.   1937 Elixir Sulfanilamide, contain the poisonous liquid, diethylene glycol, kills 107 persons, many of whom are children, dramatizing the need to establish drug safety before marketing and to pass the pending food and drug law. 1938 Congress passes [employer], Drug, and Cosmetic (FDC) Act of 1938, which requires that new drugs show safety before selling. This starts a new system of drug regulation. The Act also requires that safe limits be set for unavoidable poisonous matter and allows for factory inspections. The [employer] is given power to oversee advertising for all FDAregulated products except prescription drugs. FDA states that sulfanilamide and other dangerous drugs must be given under the direction of a medical expert. This begins the requirement for prescription only (nonnarcotic) drugs (see 1951 [place] amendment). 1941 Nearly 300 deaths and injuries result from the use of sulfathiazole tablets, an antibiotic, tainted with the sedative, phenobarbital. In response, FDA drastically changes manufacturing and quality controls. These changes lead to the development of good manufacturing practices (GMPs). 1948 The [employer] in U.S. v. Floretta that FDA jurisdiction extends to retail stores, thereby allowing FDA to stop illegal sales of drugs by pharmacies including barbiturates and amphetamines. 1950 In Walgreen. v. U.S., a U.S. Court of Appeals rules that the directions for use on a drug label must include the drug's purpose. 1951 Congress passes the [place] Amendment, which defines the kinds of  drugs that cannot be used safely without medical supervision. The amendment limits sale of these drugs to prescription only by a medical professional. All other drugs are to be available without a prescription. 1952 A nationwide  investigation by FDA reveals that chloramphenicol, an antibiotic, caused nearly 180 cases of often deadly blood diseases. Two years later FDA engages the [employer] of Hospital Pharmacists, the American Association of Medical Record Librarians, and later the American Medical Association in a voluntary program of drug reaction reporting. 1953 The [employer] Amendment clarifies previous law and requires FDA to give manufacturers written reports of conditions seen during inspections and results of factory samples. 1962 Thalidomide, a new sleeping pill, causes severe birth defects of the arms and legs in thousands of babies born in [place]. The U.S. media reports on how Dr. Cathlean Mort, a FDA medical officer, helped prevent approval and marketing of Thalidomide in the [place] . These reports stirred up public support for stronger drug laws. 3 Congress passes the [employer]. For the first time, these laws require drug makers to prove their drug works before FDA can approve them for sale. The Advisory Committee on Investigational Drugs meets for the first time. This was the first meeting of a committee to advise FDA on product approval and policy on an ongoing basis. 1966 FDA contracts with the [employer] of [employer] to measure the effectiveness of 4,000 marketed drugs approved on the basis of safety alone between [phone] and 1961-08-17. The Fair Packaging and Labeling Act requires all consumer products, in interstate commerce, to be honestly and informatively labeled. 1967/08/18 FDA forms the Drug Efficacy Study Implementation (DESI) to carry out recommendations of the [employer] of the effectiveness of drugs first sold between [place] and 1962-10-081970/10/08 FDA requires the first patient package insert, medicines must come with information for the patient about risks and benefits. 1972 Over-the-Counter Drug Review begins to enhance the safety, effectiveness and appropriate labeling of drugs sold without prescription. 1973 The U.S. Supreme Court upholds the [place] drug effectiveness law and approves FDA's action to control entire classes of products. 1982 FDA issues Tamper-resistant Packaging Regulations to prevent poisonings such as deaths from cyanide placed in Tylenol  capsules. Congress passes the [employer] in 1982/08/17, making it a crime to tamper with packaged consumer products. August 18, 1983 Drug Price Advertising account planner Act (Hatch-Waxman Act) increases the availability of less costly generic drugs by allowing FDA to approve applications for generic versions of brand-name drugs without repeating the research that proved the safety and effectiveness of the brand-name drugs. The Act also allowed brand-name companies to apply for up to five years additional patent protection for the new medicines they developed to make up for time lost while their products were going through FDA's approval process. 1989 The FDA issued guidelines asking drug makers to decide if a drug is likely to have usefulness in elderly people and to include elderly people in studies when applicable. 1991 In 1980-08-17, the FDA and the Department of Health and Human Services published a policy on protecting people in research. In 08-17-1990, this policy is adopted by more than a dozen federal agencies involved in human subject research and becomes known as the Common Rule. 4 1993 FDA launches MedWatch, a system designed to collect reports from health professionals on problems with drugs and other medical products. FDA issues guidelines for measuring gender differences in  responses to medication. Drug companies are encouraged to include patients of both sexes in their research of drugs and to study any gender-specific effects. 1995 FDA declares cigarettes to be drug delivery devices. Limits are issued on marketing and sales to reduce smoking by  young people. 1998 FDA introduces the Adverse Event Reporting System (AERS), a computerized database designed to store and study safety reports on already marketed drugs.  The Demographic Rule requires that a marketing application review data on safety and effectiveness by age, gender, and race. The Pediatric Rule requires drug makers of selected new and existing drugs to conduct studies on drug safety and effectiveness in children. 1999 Creation of the Drug Facts Label for OTC drug products. The law requires all overthe-counter drug labels to have information in a standard format. These drug facts labels are designed to give the user easy-to-find information. 2000 The U. S. [employer], upholds an earlier decision from The [employer] and Drug Administration v. [employer]. et al. and rules 5-4 that FDA does not have authority to regulate tobacco as a drug. 2002 The Best Pharmaceuticals for Children Act, in exchange for studying the drug in children, the drug maker gets six months of selling their product without competition. 2003 The Pediatric Research Equity Act gives FDA the right to ask drug companies to study the effectiveness of new drugs in children. 2004 FDA advises medical professionals to limit the use of a pain reliever called Cox-2, a nonsteroidal anti-inflammatory drug (NSAIDs). Studies had shown that long-term use raised chances of heart attacks and strokes. The warning is also added to the over-thecounter NSAIDs' Drug Facts label. Medicines used in hospitals must have a bar code to prevent patients from receiving the wrong medicine. 5 2005 The Drug Safety Board is formed,  consisting of FDA staff and representatives from the [employer] of [address] and the [employer]IG[employer]. The Board advises the Director, Center for Drug Evaluation and Research, FDA, on drug safety issues and works with the agency in sharing safety information to health professionals and patients.  The [place]  Food and Drug Administration (FDA) was first created to enforce the Pure Food and Drug Act of 1906. In this capacity, the FDA is charged with protecting the health of the US  public, to ensure the quality of its food, medicine, and cosmetics. Before this time, the [place]  government had no formal oversight of these products and left issues of quality and purity to the individual manufactures, or at times, individual states.    Review: [place] Stop ACT. (The Strengthen Opioid Misuse Prevention (STOP) Act of 2017). GENERAL ASSEMBLY OF [place]  SESSION 2017 SESSION LAW 2017-74 HOUSE BILL 243  PMP mandatory The dispenser shall report: (1) The dispenser's DEA number. (2) The name of the patient for whom the controlled substance is being dispensed, and the patient's: a. Full address, including city, state, and zip code, b. Telephone number, and c. Date of birth. (3) The date the prescription was written. (4) The date the prescription was filled. (5) The prescription number. (6) Whether the prescription is new or a refill. (7) Metric quantity of the dispensed drug. (8) Estimated days of supply of dispensed drug, if provided to the dispenser. (9) National Drug Code of dispensed drug. (10) Prescriber's DEA number. (11) Method of payment for the prescription.  No paper prescriptions  Duration of scripts Acute vs Chronic prescribing  2016 CDC Guidelines for prescribing Opioids for Chronic Pain. (Updated in 2022.) Medical Board  Laws:  Prescription Laws Drug laws, rules, and regulations are constantly changing. Any attempt to summarize them would quickly become  outdated. Because of that, the Board encourages practitioners who seek guidance on prescribing procedures to refer to the sources listed below in addition to the  Board's position statements, rules and Medical Practice Act.  [place]  Board of Pharmacy (NCBOP) (which offers the state's pharmacy laws and rules, and links to the Code of Federal Regulations) [employer] Site: www.ncbop.org  [place]  General Statutes General Web Site: PoliticalPool.cz See: [place]  Food, Drug, and Cosmetic Act: [id number] & 106-134 See: [place]  Pharmacy Practice Act, Article 4A: [phone] See: [place]  Controlled Substances Act, Article 5: 90-86 & 90-113.8 See: Use of controlled substances to render one mentally incapacitated or physically helpless: [facility] [facility]. Code, Title 21, Food & Drugs www.deadiversion.usdoj.gov Controlled Substances Schedules www.deadiversion.usdoj.gov Drug Warehouse manager - www.deadiversion.usdoj.gov 42 CFR  8.12 - Federal opioid treatment standards.   Effective July 06, 2016, prior approval will be required for opioid analgesic doses for [facility]. Medicaid and [place] [facility]) beneficiaries which:  Exceed 120 mg of morphine equivalents (MME) per day  Are greater than a 14-day supply of any opioid, or,  Are non-preferred opioid products on the [place] Medicaid Preferred Drug List (PDL)  FEDERAL 42 CFR  8.12 - Federal opioid treatment standards. Title II of the Comprehensive Drug Abuse Prevention and Control Act of 1970, commonly known as the Controlled Substance Act (CSA) Title 21 [place]  Code ([place]) Controlled Substances Act.   Reference:   ______________________________________________________________________       ______________________________________________________________________    Opioid Pain Medication Update  To: All patients taking opioid pain medications. (I.e.: hydrocodone ,  hydromorphone, oxycodone , oxymorphone, morphine, codeine, methadone, tapentadol, tramadol, buprenorphine, fentanyl , etc.)  Re: Updated review of side effects and adverse reactions of opioid analgesics, as well as new information about long term effects of this class of medications.  Direct risks of long-term opioid therapy are not limited to opioid addiction and overdose. Potential medical risks include serious fractures, breathing problems during sleep, hyperalgesia, immunosuppression, chronic constipation, bowel obstruction, myocardial infarction, and tooth decay secondary to xerostomia.  Unpredictable adverse effects that can occur even if you take your medication correctly: Cognitive impairment, respiratory depression, and death. Most people think that if they take their medication correctly, and as instructed, that they will be safe. Nothing could be farther from the truth. In reality, a significant amount of recorded deaths associated with the use of opioids has occurred in individuals that had taken the medication for a long time, and were taking their medication correctly. The following are examples of how this can happen: Patient taking his/her medication for a long time, as instructed, without any side effects, is given a certain antibiotic or another unrelated medication, which in turn triggers a Drug-to-drug interaction leading to disorientation, cognitive impairment, impaired reflexes, respiratory depression or an untoward event leading to serious bodily harm or injury, including death.  Patient taking his/her medication for a long time, as instructed, without any side effects, develops an acute impairment of liver and/or kidney function. This will lead to a rapid inability of the body to breakdown and eliminate their pain medication, which will result in effects similar to an overdose, but with the same medicine and dose that they had always taken. This again may lead to  disorientation, cognitive impairment, impaired reflexes, respiratory depression or an untoward event leading to serious bodily harm or injury, including death.  A similar problem will occur with patients as they grow older and their liver and kidney function begins to decrease as part of the aging process.  Background information: Historically, the original case for using long-term opioid therapy to treat chronic noncancer pain  was based on safety assumptions that subsequent experience has called into question. In 1996, the American Pain Society and the American Academy of Pain Medicine issued a consensus statement supporting long-term opioid therapy. This statement acknowledged the dangers of opioid prescribing but concluded that the risk for addiction was low; respiratory depression induced by opioids was short-lived, occurred mainly in opioid-naive patients, and was antagonized by pain; tolerance was not a common problem; and efforts to control diversion should not constrain opioid prescribing. This has now proven to be wrong. Experience regarding the risks for opioid addiction, misuse, and overdose in community practice has failed to support these assumptions.  According to the Centers for Disease Control and Prevention, fatal overdoses involving opioid analgesics have increased sharply over the past decade. Currently, more than 96,700 people die from drug overdoses every year. Opioids are a factor in 7 out of every 10 overdose deaths. Deaths from drug overdose have surpassed motor vehicle accidents as the leading cause of death for individuals between the ages of 72 and 51.  Clinical data suggest that neuroendocrine dysfunction may be very common in both men and women, potentially causing hypogonadism, erectile dysfunction, infertility, decreased libido, osteoporosis, and depression. Recent studies linked higher opioid dose to increased opioid-related mortality. Controlled observational studies reported  that long-term opioid therapy may be associated with increased risk for cardiovascular events. Subsequent meta-analysis concluded that the safety of long-term opioid therapy in elderly patients has not been proven.   Side Effects and adverse reactions: Common side effects: Drowsiness (sedation). Dizziness. Nausea and vomiting. Constipation. Physical dependence -- Dependence often manifests with withdrawal symptoms when opioids are discontinued or decreased. Tolerance -- As you take repeated doses of opioids, you require increased medication to experience the same effect of pain relief. Respiratory depression -- This can occur in healthy people, especially with higher doses. However, people with COPD, asthma or other lung conditions may be even more susceptible to fatal respiratory impairment.  Uncommon side effects: An increased sensitivity to feeling pain and extreme response to pain (hyperalgesia). Chronic use of opioids can lead to this. Delayed gastric emptying (the process by which the contents of your stomach are moved into your small intestine). Muscle rigidity. Immune system and hormonal dysfunction. Quick, involuntary muscle jerks (myoclonus). Arrhythmia. Itchy skin (pruritus). Dry mouth (xerostomia).  Long-term side effects: Chronic constipation. Sleep-disordered breathing (SDB). Increased risk of bone fractures. Hypothalamic-pituitary-adrenal dysregulation. Increased risk of overdose.  RISKS: Respiratory depression and death: Opioids increase the risk of respiratory depression and death.  Drug-to-drug interactions: Opioids are relatively contraindicated in combination with benzodiazepines, sleep inducers, and other central nervous system depressants. Other classes of medications (i.e.: certain antibiotics and even over-the-counter medications) may also trigger or induce respiratory depression in some patients.  Medical conditions: Patients with pre-existing respiratory  problems are at higher risk of respiratory failure and/or depression when in combination with opioid analgesics. Opioids are relatively contraindicated in some medical conditions such as central sleep apnea.   Fractures and Falls:  Opioids increase the risk and incidence of falls. This is of particular importance in elderly patients.  Endocrine System:  Long-term administration is associated with endocrine abnormalities (endocrinopathies). (Also known as Opioid-induced Endocrinopathy) Influences on both the hypothalamic-pituitary-adrenal axis?and the hypothalamic-pituitary-gonadal axis have been demonstrated with consequent hypogonadism and adrenal insufficiency in both sexes. Hypogonadism and decreased levels of dehydroepiandrosterone sulfate have been reported in men and women. Endocrine effects include: Amenorrhoea in women (abnormal absence of menstruation) Reduced libido in both sexes Decreased sexual function Erectile  dysfunction in men Hypogonadisms (decreased testicular function with shrinkage of testicles) Infertility Depression and fatigue Loss of muscle mass Anxiety Depression Immune suppression Hyperalgesia Weight gain Anemia Osteoporosis Patients (particularly women of childbearing age) should avoid opioids. There is insufficient evidence to recommend routine monitoring of asymptomatic patients taking opioids in the long-term for hormonal deficiencies.  Immune System: Human studies have demonstrated that opioids have an immunomodulating effect. These effects are mediated via opioid receptors both on immune effector cells and in the central nervous system. Opioids have been demonstrated to have adverse effects on antimicrobial response and anti-tumour surveillance. Buprenorphine has been demonstrated to have no impact on immune function.  Opioid Induced Hyperalgesia: Human studies have demonstrated that prolonged use of opioids can lead to a state of abnormal pain  sensitivity, sometimes called opioid induced hyperalgesia (OIH). Opioid induced hyperalgesia is not usually seen in the absence of tolerance to opioid analgesia. Clinically, hyperalgesia may be diagnosed if the patient on long-term opioid therapy presents with increased pain. This might be qualitatively and anatomically distinct from pain related to disease progression or to breakthrough pain resulting from development of opioid tolerance. Pain associated with hyperalgesia tends to be more diffuse than the pre-existing pain and less defined in quality. Management of opioid induced hyperalgesia requires opioid dose reduction.  Cancer: Chronic opioid therapy has been associated with an increased risk of cancer among noncancer patients with chronic pain. This association was more evident in chronic strong opioid users. Chronic opioid consumption causes significant pathological changes in the small intestine and colon. Epidemiological studies have found that there is a link between opium dependence and initiation of gastrointestinal cancers. Cancer is the second leading cause of death after cardiovascular disease. Chronic use of opioids can cause multiple conditions such as GERD, immunosuppression and renal damage as well as carcinogenic effects, which are associated with the incidence of cancers.   Mortality: Long-term opioid use has been associated with increased mortality among patients with chronic non-cancer pain (CNCP).  Prescription of long-acting opioids for chronic noncancer pain was associated with a significantly increased risk of all-cause mortality, including deaths from causes other than overdose.  Reference: Von Korff M, Kolodny A, Deyo RA, Chou R. Long-term opioid therapy reconsidered. Ann Intern Med. 2011 Sep 6;155(5):325-8. doi: 10.7326/[id number]. PMID: 78106373; PMCID: EFR6719914. Kit JINNY Laurence CINDERELLA Pearley JINNY, Hayward RA, Dunn KM, [place] KP. Risk of adverse events  in patients prescribed long-term opioids: A cohort study in the [employer]. Eur J Pain. 2019 May;23(5):908-922. doi: 10.1002/ejp.1357. Epub 2019 Jan 31. PMID: 69379883. Colameco S, Coren JS, Ciervo CA. Continuous opioid treatment for chronic noncancer pain: a time for moderation in prescribing. Postgrad Med. 2009 Jul;121(4):61-6. doi: 10.3810/pgm.2009.07.2032. PMID: 80358728. Gigi JONELLE Shlomo MILUS Levern IVER Conny RN, [place] SD, Blazina I, Lonell DASEN, Bougatsos C, Deyo RA. The effectiveness and risks of long-term opioid therapy for chronic pain: a systematic review for a [employer]ways to [employer]. Ann Intern Med. 2015 Feb 17;162(4):276-86. doi: 10.7326/M14-2559. PMID: 74418742. Rory CHRISTELLA Laurence [facility], [place] DM. NCHS Data Brief No. 22. [place]: Centers for Disease Control and Prevention; 2009. Sep, Increase in Fatal Poisonings Involving Opioid Analgesics in the [place] , 1999-2006. Song IA, Choi HR, Oh TK. Long-term opioid use and mortality in patients with chronic non-cancer pain: Ten-year follow-up study in [place] from 2010 through 2019. EClinicalMedicine. 2022 Jul 18;51:101558. doi: 10.1016/j.eclinm.2022.898441. PMID: 64124182; PMCID: EFR0695089. Huser, WSABRA [place], T., Vogelmann, T. et al. All-cause mortality in patients with long-term  opioid therapy compared with non-opioid analgesics for chronic non-cancer pain: a database study. BMC Med 18, 162 (2020). http://lester.info/ Rashidian H, Zendehdel K, Kamangar F, Malekzadeh R, Haghdoost AA. An Ecological Study of the Association between Opiate Use and Incidence of Cancers. Addict Health. 2016 Fall;8(4):252-260. PMID: 71180443; PMCID: EFR4445194.  Our Goal: Our goal is to control your pain with means other than the use of opioid pain medications.  Our Recommendation: Talk to your physician about coming off of these medications. We can assist you with the tapering  down and stopping these medicines. Based on the new information, even if you cannot completely stop the medication, a decrease in the dose may be associated with a lesser risk. Ask for other means of controlling the pain. Decrease or eliminate those factors that significantly contribute to your pain such as smoking, obesity, and a diet heavily tilted towards inflammatory nutrients.  Last Updated: 05/18/2023   ______________________________________________________________________       ______________________________________________________________________    Preparing for your procedure  Appointments: If you think you may not be able to keep your appointment, call 24-48 hours in advance to cancel. We need time to make it available to others.  Procedure visits are for procedures only. During your procedure appointment there will be: NO Prescription Refills*. NO medication changes or discussions*. NO discussion of disability issues*. NO unrelated pain problem evaluations*. NO evaluations to order other pain procedures*. *These will be addressed at a separate and distinct evaluation encounter on the provider's evaluation schedule and not during procedure days.  Instructions: Food intake: Avoid eating anything solid for at least 8 hours prior to your procedure. Clear liquid intake: You may take clear liquids such as water up to 2 hours prior to your procedure. (No carbonated drinks. No soda.) Transportation: Unless otherwise stated by your physician, bring a driver. (Driver cannot be a Market researcher, Pharmacist, community, or any other form of public transportation.) Morning Medicines: Except for blood thinners, take all of your other morning medications with a sip of water. Make sure to take your heart and blood pressure medicines. If your blood pressure's lower number is above 100, the case will be rescheduled. Blood thinners: Make sure to stop your blood thinners as instructed.  If you take a blood thinner, but  were not instructed to stop it, call our office [phone] and ask to talk to a nurse. Not stopping a blood thinner prior to certain procedures could lead to serious complications. Diabetics on insulin: Notify the staff so that you can be scheduled 1st case in the morning. If your diabetes requires high dose insulin, take only  of your normal insulin dose the morning of the procedure and notify the staff that you have done so. Preventing infections: Shower with an antibacterial soap the morning of your procedure.  Build-up your immune system: Take 1000 mg of Vitamin C with every meal (3 times a day) the day prior to your procedure. Antibiotics: Inform the nursing staff if you are taking any antibiotics or if you have any conditions that may require antibiotics prior to procedures. (Example: recent joint implants)   Pregnancy: If you are pregnant make sure to notify the nursing staff. Not doing so may result in injury to the fetus, including death.  Sickness: If you have a cold, fever, or any active infections, call and cancel or reschedule your procedure. Receiving steroids while having an infection may result in complications. Arrival: You must be in the facility at least 30 minutes prior to your  scheduled procedure. Tardiness: Your scheduled time is also the cutoff time. If you do not arrive at least 15 minutes prior to your procedure, you will be rescheduled.  Children: Do not bring any children with you. Make arrangements to keep them home. Dress appropriately: There is always a possibility that your clothing may get soiled. Avoid long dresses. Valuables: Do not bring any jewelry or valuables.  Reasons to call and reschedule or cancel your procedure: (Following these recommendations will minimize the risk of a serious complication.) Surgeries: Avoid having procedures within 2 weeks of any surgery. (Avoid for 2 weeks before or after any surgery). Flu Shots: Avoid having procedures within 2  weeks of a flu shots or . (Avoid for 2 weeks before or after immunizations). Barium: Avoid having a procedure within 7-10 days after having had a radiological study involving the use of radiological contrast. (Myelograms, Barium swallow or enema study). Heart attacks: Avoid any elective procedures or surgeries for the initial 6 months after a Myocardial Infarction (Heart Attack). Blood thinners: It is imperative that you stop these medications before procedures. Let us  know if you if you take any blood thinner.  Infection: Avoid procedures during or within two weeks of an infection (including chest colds or gastrointestinal problems). Symptoms associated with infections include: Localized redness, fever, chills, night sweats or profuse sweating, burning sensation when voiding, cough, congestion, stuffiness, runny nose, sore throat, diarrhea, nausea, vomiting, cold or Flu symptoms, recent or current infections. It is specially important if the infection is over the area that we intend to treat. Heart and lung problems: Symptoms that may suggest an active cardiopulmonary problem include: cough, chest pain, breathing difficulties or shortness of breath, dizziness, ankle swelling, uncontrolled high or unusually low blood pressure, and/or palpitations. If you are experiencing any of these symptoms, cancel your procedure and contact your primary care physician for an evaluation.  Remember:  Regular Business hours are:  Monday to Thursday 8:00 AM to 4:00 PM  Provider's Schedule: Eric Como, MD:  Procedure days: Tuesday and Thursday 7:30 AM to 4:00 PM  Wallie Sherry, MD:  Procedure days: Monday and Wednesday 7:30 AM to 4:00 PM Last  Updated: 10/20/2023 ______________________________________________________________________     ______________________________________________________________________    Procedure instructions  Stop blood-thinners  Do not eat or drink fluids (other than water)  for 6 hours before your procedure  No water for 2 hours before your procedure  Take your blood pressure medicine with a sip of water  Arrive 30 minutes before your appointment  If sedation is planned, bring suitable driver. [place], [place], & public transportation are NOT APPROVED)  Carefully read the Preparing for your procedure detailed instructions  If you have questions call us  at [phone]  Procedure appointments are for procedures only.   NO medication refills or new problem evaluations will be done on procedure days.   Only the scheduled, pre-approved procedure and side will be done.   ______________________________________________________________________

## 2024-07-12 NOTE — Progress Notes (Signed)
 PROVIDER NOTE: Interpretation of information contained herein should be left to medically-trained personnel. Specific patient instructions are provided elsewhere under Patient Instructions section of medical record. This document was created in part using AI and STT-dictation technology, any transcriptional errors that may result from this process are unintentional.  Patient: Cynthia Bennett  Service: E/M   PCP: Valora Agent, MD  DOB: 10-20-1933  DOS: 07/12/2024  Provider: Emmy MARLA Blanch, NP  MRN: [id number]  Delivery: Face-to-face  Specialty: Interventional Pain Management  Type: Established Patient  Setting: Ambulatory outpatient facility  Specialty designation: 09  Referring Prov.: Valora Agent, MD  Location: Outpatient office facility       History of present illness (HPI) Cynthia Bennett, a 89 y.o. year old female, is here today because of her No primary diagnosis found.. Cynthia Bennett primary complain today is Back Pain (Lower Back )  Pertinent problems: Cynthia Bennett has Fibromyalgia; Lumbar radicular pain (Bilateral) (R>L); Chronic low back pain (1ry area of Pain) (Bilateral) (L>R) w/o sciatica; Grade 1 spondylolisthesis of L4 over L5; Lumbar discogenic pain syndrome; Lumbar facet syndrome (Bilateral) (L>R); Lumbar facet hypertrophy (Bilateral); Chronic sacroiliac joint pain (Left); Chronic hip pain (Left); Lumbar spondylosis; and Chronic pain syndrome on their pertinent problem list.   Pain Assessment: Severity of Chronic pain is reported as a 8 /10. Location: Back Lower/Right Side hip. Onset:  . Quality: Aching, Sore. Timing: Intermittent. Modifying factor(s): Rest. Vitals:  height is 5' 2.5 (1.588 m) and weight is 145 lb (65.8 kg). Her temperature is 97.1 F (36.2 C) (abnormal). Her blood pressure is 81/50 (abnormal) and her pulse is 73. Her respiration is 18 and oxygen saturation is 97%.  BMI: Estimated body mass index is 26.1 kg/m as calculated from the following:   Height as of this encounter:  5' 2.5 (1.588 m).   Weight as of this encounter: 145 lb (65.8 kg).  Last encounter: 04/11/2024. Last procedure: Visit date not found.  Reason for encounter: medication management. The patient indicates doing well with current medication regimen. No side effects or any adverse reaction reported to medication. She complains of lower back pain; however pain medication regimen helps to manage daily functioning.  The patient experiences low back pain primarily on the left side.  The pain radiates down the left leg to the ankle but does not extend into the foot.   The patient received a lumbar epidural steroid injection (L-ESI) on February 09, 2024, which provided significant pain relief for approximately 5 months.  She is now experiencing increased back pain and expressed interest in undergoing another lumbar epidural steroid injection. Pharmacotherapy Assessment   Oxycodone  (Oxy IR/ Roxicodone ) 5 mg immediate release daily as needed for severe pain. MME=7.50  Monitoring: [place] PMP: PDMP reviewed during this encounter.       Pharmacotherapy: No side-effects or adverse reactions reported. Compliance: No problems identified. Effectiveness: Clinically acceptable.  Erlene Doyal SAUN[place], [place]  07/12/2024 11:32 AM  Sign when Signing Visit Nursing Pain Medication Assessment:  Safety precautions to be maintained throughout the outpatient stay will include: orient to surroundings, keep bed in low position, maintain call bell within reach at all times, provide assistance with transfer out of bed and ambulation.  Medication Inspection Compliance: Pill count conducted under aseptic conditions, in front of the patient. Neither the pills nor the bottle was removed from the patient's sight at any time. Once count was completed pills were immediately returned to the patient in their original bottle.  Medication: Oxycodone  IR Pill/Patch  Count: 4 of 30 pills/patches remain Pill/Patch Appearance: Markings consistent with prescribed  medication Bottle Appearance: Standard pharmacy container. Clearly labeled. Filled Date: 08 / 11 / 2025 Last Medication intake:  Today    UDS:  Summary  Date Value Ref Range Status  04/11/2024 FINAL  Final    Comment:    ==================================================================== [employer] [facility] (MW) ==================================================================== Test                             Result       Flag       Units  Drug Present and Declared for Prescription Verification   Oxycodone                       278          EXPECTED   ng/mg creat   Oxymorphone                    84           EXPECTED   ng/mg creat   Noroxycodone                   2007         EXPECTED   ng/mg creat    Sources of oxycodone  include scheduled prescription medications.    Oxymorphone and noroxycodone are expected metabolites of oxycodone .    Oxymorphone is also available as a scheduled prescription medication.  ==================================================================== Test                      Result    Flag   Units      Ref Range   Creatinine              107              mg/dL      >=79 ==================================================================== Declared Medications:  The flagging and interpretation on this report are based on the  following declared medications.  Unexpected results may arise from  inaccuracies in the declared medications.   **Note: The testing scope of this panel includes these medications:   Oxycodone  (OxyIR)   **Note: The testing scope of this panel does not include the  following reported medications:   Acetaminophen  (Tylenol )  Chlorhexidine (Peridex)  Diclofenac (Voltaren)  Doxepin (Sinequan)  Esomeprazole (Nexium)  Eye Drops  Guaifenesin  Magnesium (Mag-Ox)  Naloxone  (Narcan )  Ondansetron  (Zofran )  Sertraline (Zoloft)  Vitamin B12  Vitamin D3  Zolpidem  (Ambien) ==================================================================== For clinical consultation, please call [phone]. ====================================================================     No results found for: CBDTHCR No results found for: D8THCCBX No results found for: D9THCCBX  ROS  Constitutional: Denies any fever or chills Gastrointestinal: No reported hemesis, hematochezia, vomiting, or acute GI distress Musculoskeletal: low back pain  Neurological: No reported episodes of acute onset apraxia, aphasia, dysarthria, agnosia, amnesia, paralysis, loss of coordination, or loss of consciousness  Medication Review  Guaifenesin, Vitamin D3, acetaminophen , chlorhexidine, cyanocobalamin , diclofenac Sodium, dorzolamide-timolol, doxepin, esomeprazole, magnesium oxide, naloxone , ondansetron , oxyCODONE , sertraline, and zolpidem  History Review  Allergy: Cynthia Bennett is allergic to ibuprofen. Drug: Cynthia Bennett  reports no history of drug use. Alcohol:  reports no history of alcohol use. Tobacco:  reports that she has quit smoking. She has never used smokeless tobacco. Social: Cynthia Bennett  reports that she has quit smoking. She has never used smokeless tobacco. She reports that  she does not drink alcohol and does not use drugs. Medical:  has a past medical history of Dizzy spells, Fibromyalgia, Glaucoma, Hiatal hernia, Osteoarthritis, Osteoarthritis of spine with radiculopathy, lumbar region (08/14/2015), Peptic ulcer disease, Reflux, Spinal stenosis, Stroke (HCC), and TIA (transient ischemic attack). Surgical: Cynthia Bennett  has a past surgical history that includes Esophagogastroduodenoscopy (N/A, 04/16/2015); Appendectomy; and Abdominal hysterectomy. Family: family history includes COPD in her father; Mental illness in her mother.  Laboratory Chemistry Profile   Renal Lab Results  Component Value Date   BUN 12 05/27/2024   CREATININE 0.89 05/27/2024   BCR 11 (L) 01/10/2019   GFRAA  46 (L) 01/10/2019   GFRNONAA >60 05/27/2024    Hepatic Lab Results  Component Value Date   AST 20 05/27/2024   ALT 10 05/27/2024   ALBUMIN 3.7 05/27/2024   ALKPHOS 61 05/27/2024   LIPASE 139 04/27/2014    Electrolytes Lab Results  Component Value Date   NA 137 05/27/2024   K 3.7 05/27/2024   CL 108 05/27/2024   CALCIUM 9.0 05/27/2024   MG 2.3 01/10/2019    Bone Lab Results  Component Value Date   25OHVITD1 26 (L) 01/10/2019   25OHVITD2 1.6 01/10/2019   25OHVITD3 24 01/10/2019    Inflammation (CRP: Acute Phase) (ESR: Chronic Phase) Lab Results  Component Value Date   CRP 0.9 08/24/2020   ESRSEDRATE 51 (H) 01/10/2019         Note: Above Lab results reviewed.  Recent Imaging Review  DG PAIN CLINIC C-ARM 1-60 MIN NO REPORT Fluoro was used, but no Radiologist interpretation will be provided.  Please refer to NOTES tab for provider progress note. Note: Reviewed        Physical Exam  Vitals: BP (!) 81/50   Pulse 73   Temp (!) 97.1 F (36.2 C)   Resp 18   Ht 5' 2.5 (1.588 m)   Wt 145 lb (65.8 kg)   SpO2 97%   BMI 26.10 kg/m  BMI: Estimated body mass index is 26.1 kg/m as calculated from the following:   Height as of this encounter: 5' 2.5 (1.588 m).   Weight as of this encounter: 145 lb (65.8 kg). Ideal: Ideal body weight: 51.3 kg (112 lb 15.8 oz) Adjusted ideal body weight: 57.1 kg (125 lb 12.7 oz) General appearance: Well nourished, well developed, and well hydrated. In no apparent acute distress Mental status: Alert, oriented x 3 (person, place, & time)       Respiratory: No evidence of acute respiratory distress Eyes: PERLA  Lumbar Exam  Skin & Axial Inspection: No masses, redness, or swelling Alignment: Symmetrical Functional ROM: Pain restricted ROM affecting primarily the left Stability: No instability detected Muscle Tone/Strength: Functionally intact. No obvious neuro-muscular anomalies detected. Sensory (Neurological): Neuropathic pain  pattern Palpation: No palpable anomalies       Provocative Tests: Hyperextension/rotation test: deferred today       Lumbar quadrant test (Kemp's test): deferred today       Lateral bending test: deferred today       Patrick's Maneuver: (+) for left-sided S-I arthralgia and  FABER* test: (+) for left-sided S-I arthralgia  S-I anterior distraction/compression test: (+) Left-sided S-I arthralgia/arthropathy S-I lateral compression test: (+) Left-sided S-I arthralgia/arthropathy S-I Thigh-thrust test: deferred today         S-I Gaenslen's test: deferred today         *(Flexion, ABduction and External Rotation)  Assessment   Diagnosis Status  1. Chronic bilateral  low back pain without sciatica   2. Chronic pain of left lower extremity   3. Chronic radicular lumbar pain   4. Chronic pain of both hips   5. Spinal stenosis, lumbar region, with neurogenic claudication   6. Bertolotti's syndrome   7. Facet hypertrophy of lumbar region   8. Spondylolisthesis of lumbar region   9. Scoliosis of thoracolumbar region due to degenerative disease of spine in adult   10. DDD (degenerative disc disease), thoracolumbar   11. Chronic pain syndrome   12. Pharmacologic therapy   13. Chronic use of opiate for therapeutic purpose   14. Encounter for medication management   15. Encounter for chronic pain management    Having a Flare-up Having a Flare-up Having a Flare-up   Updated Problems: No problems updated.  Plan of Care  Problem-specific:  Assessment and Plan We will continue on current medication regimen. Prescribing Drug Monitoring (PDMP) reviewed; finding consistent with the use of prescribed medication and no evidence of narcotic misuse or abuse. Schedule follow up in 90 days for medication management with Emmy Blanch, NP.  Urine drug screening (UDS) up-to-date.  No other new issues or problems reported at this visit.  Plan: Schedule (Clinic) (L) L-ESI # 10 with Dr. Tanya   Cynthia Bennett. Cynthia NOVAK Bevel has a current medication list which includes the following long-term medication(s): doxepin, zolpidem, [START ON 07/19/2024] oxycodone , [START ON 08/18/2024] oxycodone , and [START ON 09/17/2024] oxycodone .  Pharmacotherapy (Medications Ordered): Meds ordered this encounter  Medications   oxyCODONE  (OXY IR/ROXICODONE ) 5 MG immediate release tablet    Sig: Take 1 tablet (5 mg total) by mouth daily as needed for severe pain (pain score 7-10). Must last 30 days    Dispense:  30 tablet    Refill:  0    DO NOT: delete (not duplicate); no partial-fill (will deny script to complete), no refill request (F/U required). DISPENSE: 1 day early if closed on fill date. WARN: No CNS-depressants within 8 hrs of med.   oxyCODONE  (OXY IR/ROXICODONE ) 5 MG immediate release tablet    Sig: Take 1 tablet (5 mg total) by mouth daily as needed for severe pain (pain score 7-10). Must last 30 days    Dispense:  30 tablet    Refill:  0    DO NOT: delete (not duplicate); no partial-fill (will deny script to complete), no refill request (F/U required). DISPENSE: 1 day early if closed on fill date. WARN: No CNS-depressants within 8 hrs of med.   oxyCODONE  (OXY IR/ROXICODONE ) 5 MG immediate release tablet    Sig: Take 1 tablet (5 mg total) by mouth daily as needed for severe pain (pain score 7-10). Must last 30 days    Dispense:  30 tablet    Refill:  0    DO NOT: delete (not duplicate); no partial-fill (will deny script to complete), no refill request (F/U required). DISPENSE: 1 day early if closed on fill date. WARN: No CNS-depressants within 8 hrs of med.   Orders:  Orders Placed This Encounter  Procedures   Lumbar Epidural Injection    Standing Status:   Future    Expiration Date:   10/11/2024    Scheduling Instructions:     Procedure: Interlaminar Lumbar Epidural Steroid injection (LESI)  L4-5     Laterality: Left-sided     Sedation: No Sedation      Timeframe: As soon as schedule allows.    Where will this  procedure be performed?:   [facility] Management        Return in about 3 months (around 10/11/2024) for (F2F), (MM), Emmy Blanch NP.    Recent Visits No visits were found meeting these conditions. Showing recent visits within past 90 days and meeting all other requirements Today's Visits Date Type Provider Dept  07/12/24 Office Visit Makaylin Carlo K, NP [facility]  Showing today's visits and meeting all other requirements Future Appointments Date Type Provider Dept  07/26/24 Appointment Tanya Glisson, MD [facility]  10/10/24 Appointment Yu Peggs K, NP [facility]  Showing future appointments within next 90 days and meeting all other requirements  I discussed the assessment and treatment plan with the patient. The patient was provided an opportunity to ask questions and all were answered. The patient agreed with the plan and demonstrated an understanding of the instructions.  Patient advised to call back or seek an in-person evaluation if the symptoms or condition worsens.  Duration of encounter: 30 minutes.  Total time on encounter, as per AMA guidelines included both the face-to-face and non-face-to-face time personally spent by the physician and/or other qualified health care professional(s) on the day of the encounter (includes time in activities that require the physician or other qualified health care professional and does not include time in activities normally performed by clinical staff). Physician's time may include the following activities when performed: Preparing to see the patient (e.g., pre-charting review of records, searching for previously ordered imaging, lab work, and nerve conduction tests) Review of prior analgesic pharmacotherapies. Reviewing PMP Interpreting ordered tests (e.g., lab work, imaging, nerve conduction tests) Performing post-procedure evaluations, including interpretation of diagnostic procedures Obtaining and/or  reviewing separately obtained history Performing a medically appropriate examination and/or evaluation Counseling and educating the patient/family/caregiver Ordering medications, tests, or procedures Referring and communicating with other health care professionals (when not separately reported) Documenting clinical information in the electronic or other health record Independently interpreting results (not separately reported) and communicating results to the patient/ family/caregiver Care coordination (not separately reported)  Note by: Marquay Kruse K Ermal Haberer, NP (TTS and AI technology used. I apologize for any typographical errors that were not detected and corrected.) Date: 07/12/2024; Time: 12:42 PM

## 2024-07-12 NOTE — Progress Notes (Signed)
 Nursing Pain Medication Assessment:  Safety precautions to be maintained throughout the outpatient stay will include: orient to surroundings, keep bed in low position, maintain call bell within reach at all times, provide assistance with transfer out of bed and ambulation.  Medication Inspection Compliance: Pill count conducted under aseptic conditions, in front of the patient. Neither the pills nor the bottle was removed from the patient's sight at any time. Once count was completed pills were immediately returned to the patient in their original bottle.  Medication: Oxycodone  IR Pill/Patch Count: 4 of 30 pills/patches remain Pill/Patch Appearance: Markings consistent with prescribed medication Bottle Appearance: Standard pharmacy container. Clearly labeled. Filled Date: 08 / 11 / 2025 Last Medication intake:  Today

## 2024-07-26 ENCOUNTER — Ambulatory Visit
Admission: RE | Admit: 2024-07-26 | Discharge: 2024-07-26 | Disposition: A | Discharge status: Home / Self Care | Source: Ambulatory Visit | Attending: Pain Medicine | Admitting: Pain Medicine

## 2024-07-26 ENCOUNTER — Ambulatory Visit (HOSPITAL_BASED_OUTPATIENT_CLINIC_OR_DEPARTMENT_OTHER): Admitting: Pain Medicine

## 2024-07-26 ENCOUNTER — Encounter: Payer: Self-pay | Admitting: Pain Medicine

## 2024-07-26 VITALS — BP 133/84 | HR 95 | Temp 98.1°F | Resp 16 | Ht 62.0 in | Wt 140.0 lb

## 2024-07-26 DIAGNOSIS — R29898 Other symptoms and signs involving the musculoskeletal system: Secondary | ICD-10-CM | POA: Diagnosis not present

## 2024-07-26 DIAGNOSIS — M545 Low back pain, unspecified: Secondary | ICD-10-CM | POA: Insufficient documentation

## 2024-07-26 DIAGNOSIS — M48062 Spinal stenosis, lumbar region with neurogenic claudication: Secondary | ICD-10-CM

## 2024-07-26 DIAGNOSIS — M51361 Other intervertebral disc degeneration, lumbar region with lower extremity pain only: Secondary | ICD-10-CM | POA: Diagnosis not present

## 2024-07-26 DIAGNOSIS — G8929 Other chronic pain: Secondary | ICD-10-CM | POA: Insufficient documentation

## 2024-07-26 DIAGNOSIS — M79605 Pain in left leg: Secondary | ICD-10-CM | POA: Insufficient documentation

## 2024-07-26 DIAGNOSIS — M5442 Lumbago with sciatica, left side: Secondary | ICD-10-CM | POA: Insufficient documentation

## 2024-07-26 DIAGNOSIS — M4316 Spondylolisthesis, lumbar region: Secondary | ICD-10-CM | POA: Insufficient documentation

## 2024-07-26 MED ORDER — ROPIVACAINE HCL 2 MG/ML IJ SOLN
INTRAMUSCULAR | Status: AC
  Filled 2024-07-26: qty 20

## 2024-07-26 MED ORDER — SODIUM CHLORIDE (PF) 0.9 % IJ SOLN
INTRAMUSCULAR | Status: AC
  Filled 2024-07-26: qty 10

## 2024-07-26 MED ORDER — TRIAMCINOLONE ACETONIDE 40 MG/ML IJ SUSP
INTRAMUSCULAR | Status: AC
  Filled 2024-07-26: qty 1

## 2024-07-26 MED ORDER — PENTAFLUOROPROP-TETRAFLUOROETH EX AERO: INHALATION_SPRAY | Freq: Once | CUTANEOUS | Status: DC

## 2024-07-26 MED ORDER — LIDOCAINE HCL (PF) 2 % IJ SOLN
INTRAMUSCULAR | Status: AC
  Filled 2024-07-26: qty 10

## 2024-07-26 MED ORDER — SODIUM CHLORIDE 0.9% FLUSH
2.0000 mL | Freq: Once | INTRAVENOUS | Status: AC
  Administered 2024-07-26: 2 mL

## 2024-07-26 MED ORDER — IOHEXOL 180 MG/ML  SOLN
INTRAMUSCULAR | Status: AC
  Filled 2024-07-26: qty 20

## 2024-07-26 MED ORDER — TRIAMCINOLONE ACETONIDE 40 MG/ML IJ SUSP
40.0000 mg | Freq: Once | INTRAMUSCULAR | Status: AC
  Administered 2024-07-26: 40 mg
  Filled 2024-07-26: qty 1

## 2024-07-26 MED ORDER — IOHEXOL 180 MG/ML  SOLN
10.0000 mL | Freq: Once | INTRAMUSCULAR | Status: AC
  Administered 2024-07-26: 10 mL via EPIDURAL
  Filled 2024-07-26: qty 20

## 2024-07-26 MED ORDER — ROPIVACAINE HCL 2 MG/ML IJ SOLN
2.0000 mL | Freq: Once | INTRAMUSCULAR | Status: AC
  Administered 2024-07-26: 2 mL via EPIDURAL
  Filled 2024-07-26: qty 20

## 2024-07-26 MED ORDER — LIDOCAINE HCL 2 % IJ SOLN
20.0000 mL | Freq: Once | INTRAMUSCULAR | Status: AC
  Administered 2024-07-26: 200 mg
  Filled 2024-07-26: qty 20

## 2024-07-26 NOTE — Progress Notes (Signed)
 Safety precautions to be maintained throughout the outpatient stay will include: orient to surroundings, keep bed in low position, maintain call bell within reach at all times, provide assistance with transfer out of bed and ambulation.

## 2024-07-26 NOTE — Patient Instructions (Signed)
 ______________________________________________________________________    Post-Procedure Discharge Instructions  Instructions: Apply ice:  Purpose: This will minimize any swelling and discomfort after procedure.  When: Day of procedure, as soon as you get home. How: Fill a plastic sandwich bag with crushed ice. Cover it with a small towel and apply to injection site. How long: (15 min on, 15 min off) Apply for 15 minutes then remove x 15 minutes.  Repeat sequence on day of procedure, until you go to bed. Apply heat:  Purpose: To treat any soreness and discomfort from the procedure. When: Starting the next day after the procedure. How: Apply heat to procedure site starting the day following the procedure. How long: May continue to repeat daily, until discomfort goes away. Food intake: Start with clear liquids (like water) and advance to regular food, as tolerated.  Physical activities: Keep activities to a minimum for the first 8 hours after the procedure. After that, then as tolerated. Driving: If you have received any sedation, be responsible and do not drive. You are not allowed to drive for 24 hours after having sedation. Blood thinner: (Applies only to those taking blood thinners) You may restart your blood thinner 6 hours after your procedure. Insulin : (Applies only to Diabetic patients taking insulin ) As soon as you can eat, you may resume your normal dosing schedule. Infection prevention: Keep procedure site clean and dry. Shower daily and clean area with soap and water. Post-procedure Pain Diary: Extremely important that this be done correctly and accurately. Recorded information will be used to determine the next step in treatment. For the purpose of accuracy, follow these rules: Evaluate only the area treated. Do not report or include pain from an untreated area. For the purpose of this evaluation, ignore all other areas of pain, except for the treated area. After your procedure,  avoid taking a long nap and attempting to complete the pain diary after you wake up. Instead, set your alarm clock to go off every hour, on the hour, for the initial 8 hours after the procedure. Document the duration of the numbing medicine, and the relief you are getting from it. Do not go to sleep and attempt to complete it later. It will not be accurate. If you received sedation, it is likely that you were given a medication that may cause amnesia. Because of this, completing the diary at a later time may cause the information to be inaccurate. This information is needed to plan your care. Follow-up appointment: Keep your post-procedure follow-up evaluation appointment after the procedure (usually 2 weeks for most procedures, 6 weeks for radiofrequencies). DO NOT FORGET to bring you pain diary with you.   Expect: (What should I expect to see with my procedure?) From numbing medicine (AKA: Local Anesthetics): Numbness or decrease in pain. You may also experience some weakness, which if present, could last for the duration of the local anesthetic. Onset: Full effect within 15 minutes of injected. Duration: It will depend on the type of local anesthetic used. On the average, 1 to 8 hours.  From steroids (Applies only if steroids were used): Decrease in swelling or inflammation. Once inflammation is improved, relief of the pain will follow. Onset of benefits: Depends on the amount of swelling present. The more swelling, the longer it will take for the benefits to be seen. In some cases, up to 10 days. Duration: Steroids will stay in the system x 2 weeks. Duration of benefits will depend on multiple posibilities including persistent irritating factors. Side-effects: If  present, they may typically last 2 weeks (the duration of the steroids). Frequent: Cramps (if they occur, drink Gatorade and take over-the-counter Magnesium 450-500 mg once to twice a day); water retention with temporary weight gain;  increases in blood sugar; decreased immune system response; increased appetite. Occasional: Facial flushing (red, warm cheeks); mood swings; menstrual changes. Uncommon: Long-term decrease or suppression of natural hormones; bone thinning. (These are more common with higher doses or more frequent use. This is why we prefer that our patients avoid having any injection therapies in other practices.)  Very Rare: Severe mood changes; psychosis; aseptic necrosis. From procedure: Some discomfort is to be expected once the numbing medicine wears off. This should be minimal if ice and heat are applied as instructed.  Call if: (When should I call?) You experience numbness and weakness that gets worse with time, as opposed to wearing off. New onset bowel or bladder incontinence. (Applies only to procedures done in the spine)  Emergency Numbers: Durning business hours (Monday - Thursday, 8:00 AM - 4:00 PM) (Friday, 9:00 AM - 12:00 Noon): [phone] After hours: [phone] NOTE: If you are having a problem and are unable connect with, or to talk to a provider, then go to your nearest urgent care or emergency department. If the problem is serious and urgent, please call 911. ______________________________________________________________________     ______________________________________________________________________    Steroid injections  Common steroids for injections Triamcinolone : Used by many sports medicine physicians for large joint and bursal injections, often combined with a local anesthetic like lidocaine . A study focusing on coccydynia (tailbone pain) found triamcinolone  was more effective than betamethasone, suggesting it may also be preferable for other localized inflammation conditions. Methylprednisolone: A common alternative to triamcinolone  that is also a strong anti-inflammatory. It is available in different formulations, with the acetate suspension being the long-acting option  for intra-articular injections. Dexamethasone: This is a non-particulate steroid, meaning it has a lower risk of tissue damage compared to particulate steroids like triamcinolone  and methylprednisolone. While less common for this specific use, it is an option for targeted injections.   Considerations for physicians Particulate vs. non-particulate steroids: Triamcinolone  and methylprednisolone are particulate, meaning they can clump together. Dexamethasone is non-particulate. Particulate steroids are often preferred for their longer-lasting effects but carry a theoretical higher risk for certain injections (though this is less of a concern in the costochondral joints). Combined injectate: Corticosteroids are typically mixed with a local anesthetic like lidocaine  to provide both immediate pain relief (from the anesthetic) and longer-term inflammation reduction (from the steroid). Imaging guidance: To ensure accurate placement of the needle and medication, physicians may use ultrasound or fluoroscopic guidance for the injection, especially in complex or refractory cases.   Patient guidance Before undergoing a steroid injection, discuss the options with your physician. They will determine the best steroid, dosage, and procedure for your specific case based on factors like: Severity of your condition History of response to other treatments Your overall health status Experience and preference of the physician  Last  Updated: 07/05/2024 ______________________________________________________________________

## 2024-07-26 NOTE — Progress Notes (Signed)
 PROVIDER NOTE: Interpretation of information contained herein should be left to medically-trained personnel. Specific patient instructions are provided elsewhere under Patient Instructions section of medical record. This document was created in part using STT-dictation technology, any transcriptional errors that may result from this process are unintentional.  Patient: Cynthia Bennett Type: Established DOB: 02-26-1933 MRN: [id number] PCP: Valora Agent, MD  Service: Procedure DOS: 07/26/2024 Setting: Ambulatory Location: Ambulatory outpatient facility Delivery: Face-to-face Provider: Eric DELENA Como, MD Specialty: Interventional Pain Management Specialty designation: 09 Location: Outpatient facility Ref. Prov.: Valora Agent, MD       Interventional Therapy   Type: Lumbar epidural steroid injection (LESI) (interlaminar) #10    Laterality: Left   Level:  L4-5 Level.  Imaging: Fluoroscopic guidance Spinal ([id number]) Anesthesia: Local anesthesia (1-2% Lidocaine ) Anxiolysis: None                 Sedation: No Sedation                       DOS: 07/26/2024  Performed by: Eric DELENA Como, MD  Purpose: Diagnostic/Therapeutic Indications: Lumbar radicular pain of intraspinal etiology of more than 4 weeks that has failed to respond to conservative therapy and is severe enough to impact quality of life or function. 1. Chronic low back pain (Bilateral) (L>R) w/ sciatica (Left)   2. Low back pain of over 3 months duration   3. Grade 1 spondylolisthesis of L4 over L5   4. Severe (5-6 mm) L4-5 lumbar spinal stenosis and (8 mm) L3-4 spinal stenosis.   5. DDD (degenerative disc disease), lumbar w/ LEP   6. Low back pain radiating to left leg   7. Intermittent low back pain   8. Multifactorial low back pain   9. Weakness of leg (Left)    NAS-11 Pain score:   Pre-procedure: 8 /10   Post-procedure: 0-No pain/10      Position / Prep / Materials:  Position: Prone w/ head of the table  raised (slight reverse trendelenburg) to facilitate breathing.  Prep solution: ChloraPrep (2% chlorhexidine gluconate and 70% isopropyl alcohol) Prep Area: Entire Posterior Lumbar Region from lower scapular tip down to mid buttocks area and from flank to flank. Materials:  Tray: Epidural tray Needle(s):  Type: Epidural needle (Tuohy) Gauge (G):  17 Length: Regular (3.5-in) Qty: 1  H&P (Pre-op Assessment):  Cynthia Bennett is a 89 y.o. (year old), female patient, seen today for interventional treatment. She  has a past surgical history that includes Esophagogastroduodenoscopy (N/A, 04/16/2015); Appendectomy; and Abdominal hysterectomy. Cynthia Bennett has a current medication list which includes the following prescription(s): acetaminophen , chlorhexidine, vitamin d3, cyanocobalamin , diclofenac sodium, dorzolamide-timolol, doxepin, esomeprazole, guaifenesin, magnesium oxide, naloxone , ondansetron , oxycodone , [START ON 08/18/2024] oxycodone , [START ON 09/17/2024] oxycodone , sertraline, and zolpidem, and the following Facility-Administered Medications: pentafluoroprop-tetrafluoroeth. Her primarily concern today is the Back Pain (Lower left back)  Initial Vital Signs:  Pulse/HCG Rate: 95ECG Heart Rate: 89 Temp: 98.1 F (36.7 C) Resp: 15 BP: 126/68 SpO2: 97 %  BMI: Estimated body mass index is 25.61 kg/m as calculated from the following:   Height as of this encounter: 5' 2 (1.575 m).   Weight as of this encounter: 140 lb (63.5 kg).  Risk Assessment: Allergies: Reviewed. She is allergic to ibuprofen.  Allergy Precautions: None required Coagulopathies: Reviewed. None identified.  Blood-thinner therapy: None at this time Active Infection(s): Reviewed. None identified. Cynthia Bennett is afebrile  Site Confirmation: Cynthia Bennett was asked to confirm the procedure and laterality  before marking the site Procedure checklist: Completed Consent: Before the procedure and under the influence of no sedative(s), amnesic(s), or  anxiolytics, the patient was informed of the treatment options, risks and possible complications. To fulfill our ethical and legal obligations, as recommended by the American Medical Association's Code of Ethics, I have informed the patient of my clinical impression; the nature and purpose of the treatment or procedure; the risks, benefits, and possible complications of the intervention; the alternatives, including doing nothing; the risk(s) and benefit(s) of the alternative treatment(s) or procedure(s); and the risk(s) and benefit(s) of doing nothing. The patient was provided information about the general risks and possible complications associated with the procedure. These may include, but are not limited to: failure to achieve desired goals, infection, bleeding, organ or nerve damage, allergic reactions, paralysis, and death. In addition, the patient was informed of those risks and complications associated to Spine-related procedures, such as failure to decrease pain; infection (i.e.: Meningitis, epidural or intraspinal abscess); bleeding (i.e.: epidural hematoma, subarachnoid hemorrhage, or any other type of intraspinal or peri-dural bleeding); organ or nerve damage (i.e.: Any type of peripheral nerve, nerve root, or spinal cord injury) with subsequent damage to sensory, motor, and/or autonomic systems, resulting in permanent pain, numbness, and/or weakness of one or several areas of the body; allergic reactions; (i.e.: anaphylactic reaction); and/or death. Furthermore, the patient was informed of those risks and complications associated with the medications. These include, but are not limited to: allergic reactions (i.e.: anaphylactic or anaphylactoid reaction(s)); adrenal axis suppression; blood sugar elevation that in diabetics may result in ketoacidosis or comma; water retention that in patients with history of congestive heart failure may result in shortness of breath, pulmonary edema, and decompensation  with resultant heart failure; weight gain; swelling or edema; medication-induced neural toxicity; particulate matter embolism and blood vessel occlusion with resultant organ, and/or nervous system infarction; and/or aseptic necrosis of one or more joints. Finally, the patient was informed that Medicine is not an exact science; therefore, there is also the possibility of unforeseen or unpredictable risks and/or possible complications that may result in a catastrophic outcome. The patient indicated having understood very clearly. We have given the patient no guarantees and we have made no promises. Enough time was given to the patient to ask questions, all of which were answered to the patient's satisfaction. Ms. Iannello has indicated that she wanted to continue with the procedure. Attestation: I, the ordering provider, attest that I have discussed with the patient the benefits, risks, side-effects, alternatives, likelihood of achieving goals, and potential problems during recovery for the procedure that I have provided informed consent. Date  Time: 07/26/2024 12:52 PM  Pre-Procedure Preparation:  Monitoring: As per clinic protocol. Respiration, ETCO2, SpO2, BP, heart rate and rhythm monitor placed and checked for adequate function Safety Precautions: Patient was assessed for positional comfort and pressure points before starting the procedure. Time-out: I initiated and conducted the Time-out before starting the procedure, as per protocol. The patient was asked to participate by confirming the accuracy of the Time Out information. Verification of the correct person, site, and procedure were performed and confirmed by me, the nursing staff, and the patient. Time-out conducted as per Joint Commission's Universal Protocol (UP.01.01.01). Time: 1326 Start Time: 1326 hrs.  Description/Narrative of Procedure:          Target: Epidural space via interlaminar opening, initially targeting the lower laminar  border of the superior vertebral body. Region: Lumbar Approach: Percutaneous paravertebral  Rationale (medical necessity): procedure  needed and proper for the diagnosis and/or treatment of the patient's medical symptoms and needs. Procedural Technique Safety Precautions: Aspiration looking for blood return was conducted prior to all injections. At no point did we inject any substances, as a needle was being advanced. No attempts were made at seeking any paresthesias. Safe injection practices and needle disposal techniques used. Medications properly checked for expiration dates. SDV (single dose vial) medications used. Description of the Procedure: Protocol guidelines were followed. The procedure needle was introduced through the skin, ipsilateral to the reported pain, and advanced to the target area. Bone was contacted and the needle walked caudad, until the lamina was cleared. The epidural space was identified using "loss-of-resistance technique" with 2-3 ml of PF-NaCl (0.9% NSS), in a 5cc LOR glass syringe.  Vitals:   07/26/24 1250 07/26/24 1325 07/26/24 1330 07/26/24 1333  BP: 126/68 (!) 146/99 137/74 133/84  Pulse: 95     Resp:  15 18 16   Temp: 98.1 F (36.7 C)     SpO2: 97% 97% 97% 99%  Weight: 140 lb (63.5 kg)     Height: 5' 2 (1.575 m)      Start Time: 1326 hrs. End Time: 1332 hrs.  Imaging Guidance (Spinal):          Type of Imaging Technique: Fluoroscopy Guidance (Spinal) Indication(s): Fluoroscopy guidance for needle placement to enhance accuracy in procedures requiring precise needle localization for targeted delivery of medication in or near specific anatomical locations not easily accessible without such real-time imaging assistance. Exposure Time: Please see nurses notes. Contrast: Before injecting any contrast, we confirmed that the patient did not have an allergy to iodine, shellfish, or radiological contrast. Once satisfactory needle placement was completed at the desired  level, radiological contrast was injected. Contrast injected under live fluoroscopy. No contrast complications. See chart for type and volume of contrast used. Fluoroscopic Guidance: I was personally present during the use of fluoroscopy. Tunnel Vision Technique used to obtain the best possible view of the target area. Parallax error corrected before commencing the procedure. Direction-depth-direction technique used to introduce the needle under continuous pulsed fluoroscopy. Once target was reached, antero-posterior, oblique, and lateral fluoroscopic projection used confirm needle placement in all planes. Images permanently stored in EMR. Interpretation: I personally interpreted the imaging intraoperatively. Adequate needle placement confirmed in multiple planes. Appropriate spread of contrast into desired area was observed. No evidence of afferent or efferent intravascular uptake. No intrathecal or subarachnoid spread observed. Permanent images saved into the patient's record.  Antibiotic Prophylaxis:   Anti-infectives (From admission, onward)    None      Indication(s): None identified  Post-operative Assessment:  Post-procedure Vital Signs:  Pulse/HCG Rate: 9583 Temp: 98.1 F (36.7 C) Resp: 16 BP: 133/84 SpO2: 99 %  EBL: None  Complications: No immediate post-treatment complications observed by team, or reported by patient.  Note: The patient tolerated the entire procedure well. A repeat set of vitals were taken after the procedure and the patient was kept under observation following institutional policy, for this type of procedure. Post-procedural neurological assessment was performed, showing return to baseline, prior to discharge. The patient was provided with post-procedure discharge instructions, including a section on how to identify potential problems. Should any problems arise concerning this procedure, the patient was given instructions to immediately contact us , at any  time, without hesitation. In any case, we plan to contact the patient by telephone for a follow-up status report regarding this interventional procedure.  Comments:  No additional relevant  information.  Plan of Care (POC)  Orders:  Orders Placed This Encounter  Procedures   Lumbar Epidural Injection    Scheduling Instructions:     Procedure: Interlaminar LESI L4-5     Laterality: Left     Sedation: No Sedation     Date: 07/26/2024    Where will this procedure be performed?:   [facility]   [facility] C-ARM 1-60 MIN NO REPORT    Intraoperative interpretation by procedural physician at [facility].    Standing Status:   Standing    Number of Occurrences:   1    Reason for exam::   Assistance in needle guidance and placement for procedures requiring needle placement in or near specific anatomical locations not easily accessible without such assistance.   Informed Consent Details: Physician/Practitioner Attestation; Transcribe to consent form and obtain patient signature    Note: Always confirm laterality of pain with Ms. Farabee, before procedure. Transcribe to consent form and obtain patient signature.    Physician/Practitioner attestation of informed consent for procedure/surgical case:   I, the physician/practitioner, attest that I have discussed with the patient the benefits, risks, side effects, alternatives, likelihood of achieving goals and potential problems during recovery for the procedure that I have provided informed consent.    Procedure:   Lumbar epidural steroid injection under fluoroscopic guidance    Physician/Practitioner performing the procedure:   Hayli Milligan A. Alinda Egolf, MD    Indication/Reason:   Low back and/or lower extremity pain secondary to lumbar radiculitis   Provide equipment / supplies at bedside    Procedural tray: Epidural Tray (Disposable  single use) Skin infiltration needle: Regular 1.5-in, 25-G, (x1) Block needle size: Regular  standard Catheter: No catheter required    Standing Status:   Standing    Number of Occurrences:   1    Specify:   Epidural Tray     Oxycodone  IR 5 mg, 1 tab PO daily (maximum of 5 mg/day of oxycodone .) MME/day: 7.5 mg/day.    Medications ordered for procedure: Meds ordered this encounter  Medications   iohexol  (OMNIPAQUE ) 180 MG/ML injection 10 mL    Must be Myelogram-compatible. If not available, you may substitute with a water-soluble, non-ionic, hypoallergenic, myelogram-compatible radiological contrast medium.   lidocaine  (XYLOCAINE ) 2 % (with pres) injection 400 mg   pentafluoroprop-tetrafluoroeth (GEBAUERS) aerosol   sodium chloride  flush (NS) 0.9 % injection 2 mL   ropivacaine  (PF) 2 mg/mL (0.2%) (NAROPIN ) injection 2 mL   triamcinolone  acetonide (KENALOG -40) injection 40 mg   Medications administered: We administered iohexol , lidocaine , sodium chloride  flush, ropivacaine  (PF) 2 mg/mL (0.2%), and triamcinolone  acetonide.  See the medical record for exact dosing, route, and time of administration.    Interventional Therapies  Risk Factors  Complex Considerations:  Advanced age   Planned  Pending:   Palliative left L4-5 LESI #9    Under consideration:   Palliative left L4-5 and L5-S1 lumbar facet MBB  Possible lumbar facet RFA    Completed:   Diagnostic left L5-S1 pseudoarthrosis inj. #1 (01/09/2022) (10/10 to 0/10)  Therapeutic left L4-5 LESI x8 (06/25/2021) (100/100/80/90)  Therapeutic right L4-5 LESI x1 (07/21/2017) (DNR-F/U)  Palliative right L5-S1 LESI x1 (09/27/2015) (100/100/90)  Therapeutic left L5-S1 LESI x1 (02/05/2016) (100/100/100/100)  Diagnostic right SI joint injection x2 (08/25/2019) (100/100/40/30)  Diagnostic left SI joint injection x2 (01/27/2019) (100/100/100/100)  Diagnostic left glenohumeral joint inj. x1 (04/29/2017) (100/100/98/98)  Left biceps muscle trigger point inj. x1 (07/21/2017) (DNR-F/U)  Right PSIS  trigger point inj. x1 (07/27/2018)  (100/100/50/>50)  Diagnostic right lumbar facet MBB x3 (01/09/2022) (10/10 to 0/10)  Diagnostic left L4-5 and L5-S1 lumbar facet MBB x5 (09/29/2023) (100/100/100/100)  Diagnostic left IA hip inj. x1 (01/27/2019) (100/100/100/100)    Completed by other providers:   None at this time   Therapeutic  Palliative (PRN) options:   None established       Follow-up plan:   Return in about 2 weeks (around 08/09/2024) for Eval-day (M,W), (VV), (PPE).     Recent Visits Date Type Provider Dept  07/12/24 Office Visit Patel, Seema K, NP [facility]  Showing recent visits within past 90 days and meeting all other requirements Today's Visits Date Type Provider Dept  07/26/24 Procedure visit Tanya Glisson, MD [facility]  Showing today's visits and meeting all other requirements Future Appointments Date Type Provider Dept  08/10/24 Appointment Tanya Glisson, MD [facility]  10/10/24 Appointment Patel, Seema K, NP [facility]  Showing future appointments within next 90 days and meeting all other requirements   Disposition: Discharge home  Discharge (Date  Time): 07/26/2024; 1338 hrs.   Primary Care Physician: Valora Agent, MD Location: [facility] Note by: Glisson DELENA Tanya, MD (TTS technology used. I apologize for any typographical errors that were not detected and corrected.) Date: 07/26/2024; Time: 1:39 PM  Disclaimer:  Medicine is not an Visual merchandiser. The only guarantee in medicine is that nothing is guaranteed. It is important to note that the decision to proceed with this intervention was based on the information collected from the patient. The Data and conclusions were drawn from the patient's questionnaire, the interview, and the physical examination. Because the information was provided in large part by the patient, it cannot be guaranteed that it has not been purposely or unconsciously manipulated. Every  effort has been made to obtain as much relevant data as possible for this evaluation. It is important to note that the conclusions that lead to this procedure are derived in large part from the available data. Always take into account that the treatment will also be dependent on availability of resources and existing treatment guidelines, considered by other Pain Management Practitioners as being common knowledge and practice, at the time of the intervention. For Medico-Legal purposes, it is also important to point out that variation in procedural techniques and pharmacological choices are the acceptable norm. The indications, contraindications, technique, and results of the above procedure should only be interpreted and judged by a Board-Certified Interventional Pain Specialist with extensive familiarity and expertise in the same exact procedure and technique.

## 2024-07-27 ENCOUNTER — Telehealth: Payer: Self-pay | Admitting: *Deleted

## 2024-07-27 NOTE — Telephone Encounter (Signed)
 Post procedure call:   no  questions or concerns.

## 2024-08-09 ENCOUNTER — Encounter: Payer: Self-pay | Admitting: Pain Medicine

## 2024-08-10 ENCOUNTER — Ambulatory Visit: Attending: Pain Medicine | Admitting: Pain Medicine

## 2024-08-10 DIAGNOSIS — M5442 Lumbago with sciatica, left side: Secondary | ICD-10-CM | POA: Diagnosis not present

## 2024-08-10 DIAGNOSIS — M4316 Spondylolisthesis, lumbar region: Secondary | ICD-10-CM | POA: Diagnosis not present

## 2024-08-10 DIAGNOSIS — M48062 Spinal stenosis, lumbar region with neurogenic claudication: Secondary | ICD-10-CM | POA: Diagnosis not present

## 2024-08-10 DIAGNOSIS — R29898 Other symptoms and signs involving the musculoskeletal system: Secondary | ICD-10-CM | POA: Diagnosis not present

## 2024-08-10 DIAGNOSIS — G8929 Other chronic pain: Secondary | ICD-10-CM

## 2024-08-10 DIAGNOSIS — M79605 Pain in left leg: Secondary | ICD-10-CM | POA: Diagnosis not present

## 2024-08-10 DIAGNOSIS — M545 Low back pain, unspecified: Secondary | ICD-10-CM

## 2024-08-10 NOTE — Progress Notes (Signed)
 PROVIDER NOTE: Interpretation of information contained herein should be left to medically-trained personnel. Specific patient instructions are provided elsewhere under Patient Instructions section of medical record. This document was created in part using AI and STT-dictation technology, any transcriptional errors that may result from this process are unintentional.  Patient: Cynthia Bennett  Service: E/M   PCP: Stanton Lynwood FALCON, MD  DOB: 01-29-1933  DOS: 08/10/2024  Provider: Eric DELENA Como, MD  MRN: [id number]  Delivery: Virtual Visit  Specialty: Interventional Pain Management  Type: Established Patient  Setting: Ambulatory outpatient facility  Specialty designation: 09  Referring Prov.: Stanton Lynwood FALCON, MD  Location: Remote location       Virtual Encounter - Pain Management PROVIDER NOTE: Information contained herein reflects review and annotations entered in association with encounter. Interpretation of such information and data should be left to medically-trained personnel. Information provided to patient can be located elsewhere in the medical record under Patient Instructions. Document created using STT-dictation technology, any transcriptional errors that may result from process are unintentional.    Contact & Pharmacy Preferred: [phone] Home: [phone] (home) Mobile: [phone] (mobile) E-mail: [name]  [facility] [place], [place] - [address] [place] [address] Phone: [phone] Fax: [phone]   Pre-screening  Ms. Amy offered in-person vs virtual encounter. She indicated preferring virtual for this encounter.   Reason COVID-19*  Social distancing based on CDC and AMA recommendations.   I contacted Cynthia Bennett on 08/10/2024 via telephone.      I clearly identified myself as Eric DELENA Como, MD. I verified that I was speaking with the correct person using two identifiers (Name: Cynthia Bennett, and date of birth:  17-Oct-1933).  Consent I sought verbal advanced consent from Cynthia Bennett for virtual visit interactions. I informed Cynthia Bennett of possible security and privacy concerns, risks, and limitations associated with providing not-in-person medical evaluation and management services. I also informed Cynthia Bennett of the availability of in-person appointments. Finally, I informed her that there would be a charge for the virtual visit and that she could be  personally, fully or partially, financially responsible for it. Cynthia Bennett expressed understanding and agreed to proceed.   Historic Elements   Cynthia Bennett is a 89 y.o. year old, female patient evaluated today after our last contact on 07/26/2024. Cynthia Bennett  has a past medical history of Dizzy spells, Fibromyalgia, Glaucoma, Hiatal hernia, Osteoarthritis, Osteoarthritis of spine with radiculopathy, lumbar region (08/14/2015), Peptic ulcer disease, Reflux, Spinal stenosis, Stroke (HCC), and TIA (transient ischemic attack). She also  has a past surgical history that includes Esophagogastroduodenoscopy (N/A, 04/16/2015); Appendectomy; and Abdominal hysterectomy. Cynthia Bennett has a current medication list which includes the following prescription(s): acetaminophen , chlorhexidine, vitamin d3, cyanocobalamin , diclofenac sodium, dorzolamide-timolol, doxepin, esomeprazole, guaifenesin, magnesium oxide, naloxone , ondansetron , oxycodone , [START ON 08/18/2024] oxycodone , [START ON 09/17/2024] oxycodone , sertraline, and zolpidem. She  reports that she has quit smoking. She has never used smokeless tobacco. She reports that she does not drink alcohol and does not use drugs. Cynthia Bennett is allergic to ibuprofen.  BMI: Estimated body mass index is 25.61 kg/m as calculated from the following:   Height as of 07/26/24: 5' 2 (1.575 m).   Weight as of 07/26/24: 140 lb (63.5 kg). Last encounter: 02/23/2024. Last procedure: 07/26/2024.  HPI  Today, she is being contacted for a post-procedure  assessment.  The patient indicates that the therapy continues to work for her and at this point  she is getting about an 80% improvement that seems to be ongoing.  Because of her age she is not interested in any surgery despite the fact that her L4-5 spinal stenosis is significant (5 to 6 mm).  Post-Procedure Evaluation   Type: Lumbar epidural steroid injection (LESI) (interlaminar) #10    Laterality: Left   Level:  L4-5 Level.  Imaging: Fluoroscopic guidance Spinal ([id number]) Anesthesia: Local anesthesia (1-2% Lidocaine ) Anxiolysis: None                 Sedation: No Sedation                       DOS: 07/26/2024  Performed by: Eric DELENA Como, MD  Purpose: Diagnostic/Therapeutic Indications: Lumbar radicular pain of intraspinal etiology of more than 4 weeks that has failed to respond to conservative therapy and is severe enough to impact quality of life or function. 1. Chronic low back pain (Bilateral) (L>R) w/ sciatica (Left)   2. Low back pain of over 3 months duration   3. Grade 1 spondylolisthesis of L4 over L5   4. Severe (5-6 mm) L4-5 lumbar spinal stenosis and (8 mm) L3-4 spinal stenosis.   5. DDD (degenerative disc disease), lumbar w/ LEP   6. Low back pain radiating to left leg   7. Intermittent low back pain   8. Multifactorial low back pain   9. Weakness of leg (Left)    NAS-11 Pain score:   Pre-procedure: 8 /10   Post-procedure: 0-No pain/10     Effectiveness:  Initial hour after procedure: 100 %. Subsequent 4-6 hours post-procedure: 100 %. Analgesia past initial 6 hours: 80 %. Ongoing improvement:  Analgesic: The patient indicates having attained 100% relief of the pain for the duration of local anesthetic followed by an ongoing 80% improvement. Function: Cynthia Bennett reports improvement in function ROM: Cynthia Bennett reports improvement in ROM  Pharmacotherapy Assessment  Oxycodone  IR 5 mg, 1 tab PO daily (maximum of 5 mg/day of oxycodone .) MME/day: 7.5 mg/day.    Monitoring: [place] PMP: PDMP reviewed during this encounter.       Pharmacotherapy: No side-effects or adverse reactions reported. Compliance: No problems identified. Effectiveness: Clinically acceptable. Plan: Refer to POC.  UDS:  Summary  Date Value Ref Range Status  04/11/2024 FINAL  Final    Comment:    ==================================================================== [employer] Select 13 (MW) ==================================================================== Test                             Result       Flag       Units  Drug Present and Declared for Prescription Verification   Oxycodone                       278          EXPECTED   ng/mg creat   Oxymorphone                    84           EXPECTED   ng/mg creat   Noroxycodone                   2007         EXPECTED   ng/mg creat    Sources of oxycodone  include scheduled prescription medications.    Oxymorphone and noroxycodone are expected metabolites of oxycodone .  Oxymorphone is also available as a scheduled prescription medication.  ==================================================================== Test                      Result    Flag   Units      Ref Range   Creatinine              107              mg/dL      >=79 ==================================================================== Declared Medications:  The flagging and interpretation on this report are based on the  following declared medications.  Unexpected results may arise from  inaccuracies in the declared medications.   **Note: The testing scope of this panel includes these medications:   Oxycodone  (OxyIR)   **Note: The testing scope of this panel does not include the  following reported medications:   Acetaminophen  (Tylenol )  Chlorhexidine (Peridex)  Diclofenac (Voltaren)  Doxepin (Sinequan)  Esomeprazole (Nexium)  Eye Drops  Guaifenesin  Magnesium (Mag-Ox)  Naloxone  (Narcan )  Ondansetron  (Zofran )  Sertraline (Zoloft)  Vitamin B12   Vitamin D3  Zolpidem (Ambien) ==================================================================== For clinical consultation, please call [phone]. ====================================================================    No results found for: CBDTHCR, D8THCCBX, D9THCCBX  Laboratory Chemistry Profile   Renal Lab Results  Component Value Date   BUN 12 05/27/2024   CREATININE 0.89 05/27/2024   BCR 11 (L) 01/10/2019   GFRAA 46 (L) 01/10/2019   GFRNONAA >60 05/27/2024    Hepatic Lab Results  Component Value Date   AST 20 05/27/2024   ALT 10 05/27/2024   ALBUMIN 3.7 05/27/2024   ALKPHOS 61 05/27/2024   LIPASE 139 04/27/2014    Electrolytes Lab Results  Component Value Date   NA 137 05/27/2024   K 3.7 05/27/2024   CL 108 05/27/2024   CALCIUM 9.0 05/27/2024   MG 2.3 01/10/2019    Bone Lab Results  Component Value Date   25OHVITD1 26 (L) 01/10/2019   25OHVITD2 1.6 01/10/2019   25OHVITD3 24 01/10/2019    Inflammation (CRP: Acute Phase) (ESR: Chronic Phase) Lab Results  Component Value Date   CRP 0.9 08/24/2020   ESRSEDRATE 51 (H) 01/10/2019         Note: Above Lab results reviewed.  Imaging  DG PAIN CLINIC C-ARM 1-60 MIN NO REPORT Fluoro was used, but no Radiologist interpretation will be provided.  Please refer to NOTES tab for provider progress note.  Assessment  The primary encounter diagnosis was Chronic low back pain (Bilateral) (L>R) w/ sciatica (Left). Diagnoses of Low back pain radiating to left leg, Chronic pain of left lower extremity, Weakness of leg (Left), Severe (5-6 mm) L4-5 lumbar spinal stenosis and (8 mm) L3-4 spinal stenosis., Grade 1 spondylolisthesis of L4 over L5, Low back pain of over 3 months duration, Multifactorial low back pain, and Intermittent low back pain were also pertinent to this visit.  Plan of Care  Problem-specific:  No problem-specific Assessment & Plan notes found for this encounter.  Cynthia Bennett has a  current medication list which includes the following long-term medication(s): doxepin, oxycodone , [START ON 08/18/2024] oxycodone , [START ON 09/17/2024] oxycodone , and zolpidem.  Pharmacotherapy (Medications Ordered): No orders of the defined types were placed in this encounter.  Orders:  No orders of the defined types were placed in this encounter.  Follow-up plan:   Return if symptoms worsen or fail to improve.      Interventional Therapies  Risk Factors  Complex Considerations:  Advanced  age   Planned  Pending:   Palliative left L4-5 LESI #9    Under consideration:   Palliative left L4-5 and L5-S1 lumbar facet MBB  Possible lumbar facet RFA    Completed:   Diagnostic left L5-S1 pseudoarthrosis inj. #1 (01/09/2022) (10/10 to 0/10)  Therapeutic left L4-5 LESI x8 (06/25/2021) (100/100/80/90)  Therapeutic right L4-5 LESI x1 (07/21/2017) (DNR-F/U)  Palliative right L5-S1 LESI x1 (09/27/2015) (100/100/90)  Therapeutic left L5-S1 LESI x1 (02/05/2016) (100/100/100/100)  Diagnostic right SI joint injection x2 (08/25/2019) (100/100/40/30)  Diagnostic left SI joint injection x2 (01/27/2019) (100/100/100/100)  Diagnostic left glenohumeral joint inj. x1 (04/29/2017) (100/100/98/98)  Left biceps muscle trigger point inj. x1 (07/21/2017) (DNR-F/U)  Right PSIS trigger point inj. x1 (07/27/2018) (100/100/50/>50)  Diagnostic right lumbar facet MBB x3 (01/09/2022) (10/10 to 0/10)  Diagnostic left L4-5 and L5-S1 lumbar facet MBB x5 (09/29/2023) (100/100/100/100)  Diagnostic left IA hip inj. x1 (01/27/2019) (100/100/100/100)    Completed by other providers:   None at this time   Therapeutic  Palliative (PRN) options:   None established       Recent Visits Date Type Provider Dept  07/26/24 Procedure visit Tanya Glisson, MD [facility]  07/12/24 Office Visit Patel, Seema K, NP [facility]  Showing recent visits within past 90 days and meeting all other  requirements Today's Visits Date Type Provider Dept  08/10/24 Office Visit Tanya Glisson, MD [facility]  Showing today's visits and meeting all other requirements Future Appointments Date Type Provider Dept  10/10/24 Appointment Patel, Seema K, NP [facility]  Showing future appointments within next 90 days and meeting all other requirements  I discussed the assessment and treatment plan with the patient. The patient was provided an opportunity to ask questions and all were answered. The patient agreed with the plan and demonstrated an understanding of the instructions.  Patient advised to call back or seek an in-person evaluation if the symptoms or condition worsens.  Duration of encounter: 11 minutes.  Note by: Glisson DELENA Tanya, MD Date: 08/10/2024; Time: 3:26 PM

## 2024-08-22 ENCOUNTER — Other Ambulatory Visit

## 2024-08-22 ENCOUNTER — Ambulatory Visit: Admitting: Internal Medicine

## 2024-08-22 ENCOUNTER — Ambulatory Visit

## 2024-08-25 DIAGNOSIS — H524 Presbyopia: Secondary | ICD-10-CM | POA: Diagnosis not present

## 2024-09-13 ENCOUNTER — Inpatient Hospital Stay: Admitting: Nurse Practitioner

## 2024-09-13 ENCOUNTER — Inpatient Hospital Stay

## 2024-09-13 ENCOUNTER — Inpatient Hospital Stay: Attending: Internal Medicine

## 2024-09-13 VITALS — BP 113/71 | HR 77 | Temp 98.7°F | Resp 20

## 2024-09-13 VITALS — BP 108/66 | HR 94 | Resp 18 | Ht 62.0 in | Wt 145.0 lb

## 2024-09-13 DIAGNOSIS — Z886 Allergy status to analgesic agent status: Secondary | ICD-10-CM | POA: Diagnosis not present

## 2024-09-13 DIAGNOSIS — Z818 Family history of other mental and behavioral disorders: Secondary | ICD-10-CM | POA: Insufficient documentation

## 2024-09-13 DIAGNOSIS — Z8711 Personal history of peptic ulcer disease: Secondary | ICD-10-CM | POA: Diagnosis not present

## 2024-09-13 DIAGNOSIS — N1832 Chronic kidney disease, stage 3b: Secondary | ICD-10-CM | POA: Diagnosis not present

## 2024-09-13 DIAGNOSIS — M5430 Sciatica, unspecified side: Secondary | ICD-10-CM | POA: Insufficient documentation

## 2024-09-13 DIAGNOSIS — M2550 Pain in unspecified joint: Secondary | ICD-10-CM | POA: Diagnosis not present

## 2024-09-13 DIAGNOSIS — E611 Iron deficiency: Secondary | ICD-10-CM

## 2024-09-13 DIAGNOSIS — M1990 Unspecified osteoarthritis, unspecified site: Secondary | ICD-10-CM | POA: Insufficient documentation

## 2024-09-13 DIAGNOSIS — Z87891 Personal history of nicotine dependence: Secondary | ICD-10-CM | POA: Insufficient documentation

## 2024-09-13 DIAGNOSIS — Z825 Family history of asthma and other chronic lower respiratory diseases: Secondary | ICD-10-CM | POA: Insufficient documentation

## 2024-09-13 DIAGNOSIS — D509 Iron deficiency anemia, unspecified: Secondary | ICD-10-CM | POA: Diagnosis not present

## 2024-09-13 DIAGNOSIS — R5383 Other fatigue: Secondary | ICD-10-CM | POA: Diagnosis not present

## 2024-09-13 DIAGNOSIS — D631 Anemia in chronic kidney disease: Secondary | ICD-10-CM | POA: Insufficient documentation

## 2024-09-13 DIAGNOSIS — Z8673 Personal history of transient ischemic attack (TIA), and cerebral infarction without residual deficits: Secondary | ICD-10-CM | POA: Insufficient documentation

## 2024-09-13 DIAGNOSIS — M549 Dorsalgia, unspecified: Secondary | ICD-10-CM | POA: Insufficient documentation

## 2024-09-13 DIAGNOSIS — Z79899 Other long term (current) drug therapy: Secondary | ICD-10-CM | POA: Diagnosis not present

## 2024-09-13 DIAGNOSIS — Z90710 Acquired absence of both cervix and uterus: Secondary | ICD-10-CM | POA: Insufficient documentation

## 2024-09-13 DIAGNOSIS — Z9049 Acquired absence of other specified parts of digestive tract: Secondary | ICD-10-CM | POA: Insufficient documentation

## 2024-09-13 LAB — CBC WITH DIFFERENTIAL (CANCER CENTER ONLY)
Abs Immature Granulocytes: 0.05 K/uL (ref 0.00–0.07)
Basophils Absolute: 0.1 K/uL (ref 0.0–0.1)
Basophils Relative: 1 %
Eosinophils Absolute: 0.3 K/uL (ref 0.0–0.5)
Eosinophils Relative: 3 %
HCT: 42.4 % (ref 36.0–46.0)
Hemoglobin: 13.6 g/dL (ref 12.0–15.0)
Immature Granulocytes: 1 %
Lymphocytes Relative: 24 %
Lymphs Abs: 2.1 K/uL (ref 0.7–4.0)
MCH: 28.3 pg (ref 26.0–34.0)
MCHC: 32.1 g/dL (ref 30.0–36.0)
MCV: 88.3 fL (ref 80.0–100.0)
Monocytes Absolute: 0.7 K/uL (ref 0.1–1.0)
Monocytes Relative: 8 %
Neutro Abs: 5.8 K/uL (ref 1.7–7.7)
Neutrophils Relative %: 63 %
Platelet Count: 300 K/uL (ref 150–400)
RBC: 4.8 MIL/uL (ref 3.87–5.11)
RDW: 13.2 % (ref 11.5–15.5)
WBC Count: 9 K/uL (ref 4.0–10.5)
nRBC: 0 % (ref 0.0–0.2)

## 2024-09-13 LAB — BASIC METABOLIC PANEL WITH GFR
Anion gap: 8 (ref 5–15)
BUN: 16 mg/dL (ref 8–23)
CO2: 22 mmol/L (ref 22–32)
Calcium: 9.2 mg/dL (ref 8.9–10.3)
Chloride: 105 mmol/L (ref 98–111)
Creatinine, Ser: 1.03 mg/dL — ABNORMAL HIGH (ref 0.44–1.00)
GFR, Estimated: 52 mL/min — ABNORMAL LOW (ref >60)
Glucose, Bld: 109 mg/dL — ABNORMAL HIGH (ref 70–99)
Potassium: 4.2 mmol/L (ref 3.5–5.1)
Sodium: 135 mmol/L (ref 135–145)

## 2024-09-13 LAB — IRON AND TIBC
Iron: 79 ug/dL (ref 28–170)
Saturation Ratios: 25 % (ref 10.4–31.8)
TIBC: 312 ug/dL (ref 250–450)
UIBC: 233 ug/dL

## 2024-09-13 LAB — VITAMIN B12: Vitamin B-12: 465 pg/mL (ref 180–914)

## 2024-09-13 LAB — FERRITIN: Ferritin: 49 ng/mL (ref 11–307)

## 2024-09-13 MED ORDER — SODIUM CHLORIDE 0.9% FLUSH
10.0000 mL | Freq: Once | INTRAVENOUS | Status: AC | PRN
  Administered 2024-09-13: 10 mL
  Filled 2024-09-13: qty 10

## 2024-09-13 MED ORDER — IRON SUCROSE 20 MG/ML IV SOLN
200.0000 mg | Freq: Once | INTRAVENOUS | Status: AC
  Administered 2024-09-13: 200 mg via INTRAVENOUS
  Filled 2024-09-13: qty 10

## 2024-09-13 NOTE — Progress Notes (Signed)
 [facility] CONSULT NOTE  Patient Care Team: Valora Lynwood FALCON, MD as PCP - General (Family Medicine) Rennie Cindy SAUNDERS, MD as Consulting Physician (Hematology and Oncology)  CHIEF COMPLAINTS/PURPOSE OF CONSULTATION: Anemia   HEMATOLOGY HISTORY  # CHRONIC NORMOCYTIC ANEMIA [since 2019 PCP- Hb 8-9; Iron  sat- 5%; NO Ferritin; GFR- 43] EGD/colonoscopy-?2013-2104- pt declines procedures; Intolerance to p.o. iron . IV Venofer -  #CKD stage III/chronic pain back-pain clinic narcotics.  HISTORY OF PRESENTING ILLNESS: In a wheelchair.  Accompanied by family.   Cynthia Bennett 89 y.o. female history of anemia secondary to chronic kidney disease who returns to clinic for follow-up. She is fatigued which comes and goes. Tolerating gentle iron  better. Denies any neurologic complaints. Denies recent fevers or illnesses. Denies any easy bleeding or bruising. No melena or hematochezia. No pica or restless leg. Reports good appetite and denies weight loss. Denies chest pain. Denies any nausea, vomiting, constipation, or diarrhea. Denies urinary complaints. Patient offers no further specific complaints today.  Review of Systems  Constitutional:  Positive for malaise/fatigue. Negative for chills, diaphoresis and fever.  HENT:  Negative for nosebleeds and sore throat.   Eyes:  Negative for double vision.  Respiratory:  Negative for cough, hemoptysis, sputum production, shortness of breath and wheezing.   Cardiovascular:  Negative for chest pain, palpitations, orthopnea and leg swelling.  Gastrointestinal:  Negative for abdominal pain, blood in stool, constipation, diarrhea, heartburn, melena, nausea and vomiting.  Genitourinary:  Negative for dysuria, frequency and urgency.  Musculoskeletal:  Positive for back pain and joint pain.  Skin: Negative.  Negative for itching and rash.  Neurological:  Negative for dizziness, tingling, focal weakness, weakness and headaches.  Endo/Heme/Allergies:   Does not bruise/bleed easily.  Psychiatric/Behavioral:  Negative for depression. The patient is not nervous/anxious and does not have insomnia.     MEDICAL HISTORY:  Past Medical History:  Diagnosis Date   Dizzy spells    Fibromyalgia    Glaucoma    Hiatal hernia    Osteoarthritis    Osteoarthritis of spine with radiculopathy, lumbar region 08/14/2015   Peptic ulcer disease    Reflux    Spinal stenosis    Stroke (HCC)    TIA (transient ischemic attack)     SURGICAL HISTORY: Past Surgical History:  Procedure Laterality Date   ABDOMINAL HYSTERECTOMY     APPENDECTOMY     ESOPHAGOGASTRODUODENOSCOPY N/A 04/16/2015   Procedure: ESOPHAGOGASTRODUODENOSCOPY (EGD);  Surgeon: Deward CINDERELLA Piedmont, MD;  Location: [facility] ENDOSCOPY;  Service: Gastroenterology;  Laterality: N/A;    SOCIAL HISTORY: Social History   Socioeconomic History   Marital status: Widowed    Spouse name: Not on file   Number of children: Not on file   Years of education: Not on file   Highest education level: Not on file  Occupational History   Not on file  Tobacco Use   Smoking status: Former   Smokeless tobacco: Never  Vaping Use   Vaping status: Never Used  Substance and Sexual Activity   Alcohol use: No    Alcohol/week: 0.0 standard drinks of alcohol   Drug use: No   Sexual activity: Not on file  Other Topics Concern   Not on file  Social History Narrative   Pt lives in [place]; with son. Quit smoking in 1957. No alcohol. Designed clothes/ alterations.    Social Drivers of Health   Financial Resource Strain: Patient Declined (03/30/2023)   Received from [facility]   Overall Financial Resource  Strain (CARDIA)    Difficulty of Paying Living Expenses: Patient declined  Food Insecurity: Patient Declined (03/30/2023)   Received from [facility]   Hunger Vital Sign    Within the past 12 months, you worried that your food would run out before you got the money to buy more.:  Patient declined    Within the past 12 months, the food you bought just didn't last and you didn't have money to get more.: Patient declined  Transportation Needs: Patient Declined (03/30/2023)   Received from [facility] - Transportation    In the past 12 months, has lack of transportation kept you from medical appointments or from getting medications?: Patient declined    Lack of Transportation (Non-Medical): Patient declined  Physical Activity: Not on file  Stress: Not on file  Social Connections: Not on file  Intimate Partner Violence: Not on file    FAMILY HISTORY: Family History  Problem Relation Age of Onset   Mental illness Mother    COPD Father     ALLERGIES:  is allergic to ibuprofen.  MEDICATIONS:  Current Outpatient Medications  Medication Sig Dispense Refill   acetaminophen  (TYLENOL ) 325 MG tablet Take 650 mg by mouth every 6 (six) hours as needed.     chlorhexidine (PERIDEX) 0.12 % solution SMARTSIG:By Mouth     Cholecalciferol (VITAMIN D3) 20 MCG (800 UNIT) TABS Take by mouth 2 (two) times daily.     diclofenac Sodium (VOLTAREN) 1 % GEL Apply topically.     doxepin (SINEQUAN) 100 MG capsule Take 100 mg by mouth at bedtime.     esomeprazole (NEXIUM) 40 MG capsule Take 40 mg by mouth 2 (two) times daily.     Guaifenesin 1200 MG TB12 Take 1 tablet by mouth daily.     naloxone  (NARCAN ) nasal spray 4 mg/0.1 mL Place 1 spray into the nose as needed for up to 365 doses (for opioid-induced respiratory depresssion). In case of emergency (overdose), spray once into each nostril. If no response within 3 minutes, repeat application and call 911. 1 each 0   ondansetron  (ZOFRAN ) 8 MG tablet TAKE 1 TABLET BY MOUTH EVERY 8 HOURS AS NEEDED FOR NAUSEA AND VOMITING 45 tablet 1   oxyCODONE  (OXY IR/ROXICODONE ) 5 MG immediate release tablet Take 1 tablet (5 mg total) by mouth daily as needed for severe pain (pain score 7-10). Must last 30 days 30 tablet 0    sertraline (ZOLOFT) 100 MG tablet Take 100 mg by mouth daily.     zolpidem (AMBIEN) 10 MG tablet TAKE 1/2 TO 1 TABLET BY MOUTH EVERY NIGHT AT BEDTIME     cyanocobalamin  (VITAMIN B12) 1000 MCG/ML injection Inject 1,000 mcg into the muscle every 30 (thirty) days. (Patient not taking: Reported on 09/13/2024)     dorzolamide-timolol (COSOPT) 22.3-6.8 MG/ML ophthalmic solution Place 1 drop into both eyes in the morning and at bedtime. (Patient not taking: Reported on 09/13/2024)     magnesium oxide (MAG-OX) 400 MG tablet Take by mouth. (Patient not taking: Reported on 09/13/2024)     oxyCODONE  (OXY IR/ROXICODONE ) 5 MG immediate release tablet Take 1 tablet (5 mg total) by mouth daily as needed for severe pain (pain score 7-10). Must last 30 days (Patient not taking: Reported on 09/13/2024) 30 tablet 0   [START ON 09/17/2024] oxyCODONE  (OXY IR/ROXICODONE ) 5 MG immediate release tablet Take 1 tablet (5 mg total) by mouth daily as needed for severe pain (pain score 7-10). Must  last 30 days 30 tablet 0   No current facility-administered medications for this visit.      PHYSICAL EXAMINATION:   Vitals:   09/13/24 1322  BP: 108/66  Pulse: 94  Resp: 18  SpO2: 97%   Filed Weights   09/13/24 1322  Weight: 145 lb (65.8 kg)   Physical Exam HENT:     Head: Normocephalic and atraumatic.     Mouth/Throat:     Pharynx: No oropharyngeal exudate.  Eyes:     Pupils: Pupils are equal, round, and reactive to light.  Cardiovascular:     Rate and Rhythm: Normal rate and regular rhythm.  Pulmonary:     Effort: Pulmonary effort is normal. No respiratory distress.     Breath sounds: Normal breath sounds. No wheezing.  Abdominal:     General: Bowel sounds are normal. There is no distension.     Palpations: Abdomen is soft. There is no mass.     Tenderness: There is no abdominal tenderness. There is no guarding or rebound.  Musculoskeletal:        General: No tenderness. Normal range of motion.     Cervical  back: Normal range of motion and neck supple.  Skin:    General: Skin is warm.  Neurological:     Mental Status: She is alert and oriented to person, place, and time.  Psychiatric:        Mood and Affect: Affect normal.    LABORATORY DATA:  I have reviewed the data as listed Lab Results  Component Value Date   WBC 9.0 09/13/2024   HGB 13.6 09/13/2024   HCT 42.4 09/13/2024   MCV 88.3 09/13/2024   PLT 300 09/13/2024   Recent Labs    04/22/24 1307 05/27/24 0939 09/13/24 1303  NA 135 137 135  K 3.9 3.7 4.2  CL 106 108 105  CO2 20* 21* 22  GLUCOSE 128* 133* 109*  BUN 19 12 16   CREATININE 1.04* 0.89 1.03*  CALCIUM 8.7* 9.0 9.2  GFRNONAA 52* >60 52*  PROT  --  6.6  --   ALBUMIN  --  3.7  --   AST  --  20  --   ALT  --  10  --   ALKPHOS  --  61  --   BILITOT  --  0.6  --    Iron /TIBC/Ferritin/ %Sat    Component Value Date/Time   IRON  25 (L) 05/27/2024 0939   TIBC 267 05/27/2024 0939   FERRITIN 87 05/27/2024 0939   IRONPCTSAT 9 (L) 05/27/2024 0939   No results found.  Assessment & Plan:   Anemia due to stage 3b chronic kidney disease  # Anemia iron  def/CKD stageIII : Hb-8-9 [MCV- 77] s/p IV iron  infusion.  Last received IV iron  in August 2025.  # Hemoglobin today is 13.6.  Ferritin and iron  studies are pending at time of visit.  Previously, ferritin was 87 however, iron  saturation was 9%.  She continues taking gentle iron  1 pill/day.  Tolerating well.  Recommend proceeding with Venofer  today.  Discussed that additional doses would be based on her iron  stores   # Etiology: Unclear - GI versus CKD.  colo > 10 years ago; patient reluctant with GI procedures in the past. Recommended CT CAP however, declined.     # CKD stage III-[GFR 57]-  stable.    # Constipation: ? Narcotics-defer to PCP/pain clinic-  stable.    # Chronic back pain/? Sciatica [Dr. Naveira]- stable.    #  Weight loss- s/p dental extraction. S/p referral to nutrition- improved.    # DISPOSITION:   - venofer  today - 4 mo- labs (cbc, cmp, ferritin, iron  studies, b12), Dr Rennie, +/- venofer - la  No problem-specific Assessment & Plan notes found for this encounter.  All questions were answered. The patient knows to call the clinic with any problems, questions or concerns.  Cynthia KANDICE Dawn, NP 09/13/2024

## 2024-09-15 ENCOUNTER — Other Ambulatory Visit: Payer: Self-pay | Admitting: *Deleted

## 2024-09-15 DIAGNOSIS — H353131 Nonexudative age-related macular degeneration, bilateral, early dry stage: Secondary | ICD-10-CM | POA: Diagnosis not present

## 2024-09-15 DIAGNOSIS — H04123 Dry eye syndrome of bilateral lacrimal glands: Secondary | ICD-10-CM | POA: Diagnosis not present

## 2024-09-15 DIAGNOSIS — N1832 Chronic kidney disease, stage 3b: Secondary | ICD-10-CM

## 2024-09-15 DIAGNOSIS — Z961 Presence of intraocular lens: Secondary | ICD-10-CM | POA: Diagnosis not present

## 2024-09-15 DIAGNOSIS — H401133 Primary open-angle glaucoma, bilateral, severe stage: Secondary | ICD-10-CM | POA: Diagnosis not present

## 2024-10-04 DIAGNOSIS — M797 Fibromyalgia: Secondary | ICD-10-CM | POA: Diagnosis not present

## 2024-10-04 DIAGNOSIS — M48061 Spinal stenosis, lumbar region without neurogenic claudication: Secondary | ICD-10-CM | POA: Diagnosis not present

## 2024-10-04 DIAGNOSIS — D649 Anemia, unspecified: Secondary | ICD-10-CM | POA: Diagnosis not present

## 2024-10-04 DIAGNOSIS — G459 Transient cerebral ischemic attack, unspecified: Secondary | ICD-10-CM | POA: Diagnosis not present

## 2024-10-04 DIAGNOSIS — M545 Low back pain, unspecified: Secondary | ICD-10-CM | POA: Diagnosis not present

## 2024-10-04 DIAGNOSIS — G8929 Other chronic pain: Secondary | ICD-10-CM | POA: Diagnosis not present

## 2024-10-04 DIAGNOSIS — E785 Hyperlipidemia, unspecified: Secondary | ICD-10-CM | POA: Diagnosis not present

## 2024-10-04 DIAGNOSIS — G4700 Insomnia, unspecified: Secondary | ICD-10-CM | POA: Diagnosis not present

## 2024-10-04 DIAGNOSIS — Z0000 Encounter for general adult medical examination without abnormal findings: Secondary | ICD-10-CM | POA: Diagnosis not present

## 2024-10-04 DIAGNOSIS — K219 Gastro-esophageal reflux disease without esophagitis: Secondary | ICD-10-CM | POA: Diagnosis not present

## 2024-10-04 DIAGNOSIS — Z1331 Encounter for screening for depression: Secondary | ICD-10-CM | POA: Diagnosis not present

## 2024-10-04 DIAGNOSIS — F1120 Opioid dependence, uncomplicated: Secondary | ICD-10-CM | POA: Diagnosis not present

## 2024-10-10 ENCOUNTER — Encounter: Payer: Self-pay | Admitting: Nurse Practitioner

## 2024-10-10 ENCOUNTER — Ambulatory Visit: Attending: Nurse Practitioner | Admitting: Nurse Practitioner

## 2024-10-10 DIAGNOSIS — G8929 Other chronic pain: Secondary | ICD-10-CM | POA: Diagnosis not present

## 2024-10-10 DIAGNOSIS — M47816 Spondylosis without myelopathy or radiculopathy, lumbar region: Secondary | ICD-10-CM | POA: Insufficient documentation

## 2024-10-10 DIAGNOSIS — M4316 Spondylolisthesis, lumbar region: Secondary | ICD-10-CM | POA: Insufficient documentation

## 2024-10-10 DIAGNOSIS — M79605 Pain in left leg: Secondary | ICD-10-CM | POA: Insufficient documentation

## 2024-10-10 DIAGNOSIS — M25551 Pain in right hip: Secondary | ICD-10-CM | POA: Diagnosis not present

## 2024-10-10 DIAGNOSIS — M25552 Pain in left hip: Secondary | ICD-10-CM | POA: Insufficient documentation

## 2024-10-10 DIAGNOSIS — M5416 Radiculopathy, lumbar region: Secondary | ICD-10-CM | POA: Diagnosis not present

## 2024-10-10 DIAGNOSIS — Z79891 Long term (current) use of opiate analgesic: Secondary | ICD-10-CM | POA: Diagnosis not present

## 2024-10-10 DIAGNOSIS — G894 Chronic pain syndrome: Secondary | ICD-10-CM | POA: Insufficient documentation

## 2024-10-10 DIAGNOSIS — M545 Low back pain, unspecified: Secondary | ICD-10-CM | POA: Diagnosis not present

## 2024-10-10 DIAGNOSIS — M5135 Other intervertebral disc degeneration, thoracolumbar region: Secondary | ICD-10-CM | POA: Diagnosis not present

## 2024-10-10 DIAGNOSIS — Q7649 Other congenital malformations of spine, not associated with scoliosis: Secondary | ICD-10-CM | POA: Insufficient documentation

## 2024-10-10 DIAGNOSIS — M4155 Other secondary scoliosis, thoracolumbar region: Secondary | ICD-10-CM | POA: Diagnosis not present

## 2024-10-10 DIAGNOSIS — M48062 Spinal stenosis, lumbar region with neurogenic claudication: Secondary | ICD-10-CM | POA: Diagnosis not present

## 2024-10-10 DIAGNOSIS — Z79899 Other long term (current) drug therapy: Secondary | ICD-10-CM

## 2024-10-10 MED ORDER — OXYCODONE HCL 5 MG PO TABS: 5.0000 mg | ORAL_TABLET | Freq: Every day | ORAL | 0 refills | Status: AC | PRN

## 2024-10-10 NOTE — Progress Notes (Signed)
 PROVIDER NOTE: Interpretation of information contained herein should be left to medically-trained personnel. Specific patient instructions are provided elsewhere under Patient Instructions section of medical record. This document was created in part using AI and STT-dictation technology, any transcriptional errors that may result from this process are unintentional.  Patient: Cynthia Bennett  Service: E/M   PCP: Valora Lynwood FALCON, MD  DOB: 1933-03-22  DOS: 10/10/2024  Provider: Emmy MARLA Blanch, NP  MRN: [id number]  Delivery: Face-to-face  Specialty: Interventional Pain Management  Type: Established Patient  Setting: Ambulatory outpatient facility  Specialty designation: 09  Referring Prov.: Valora Lynwood FALCON, MD  Location: Outpatient office facility       History of present illness (HPI) [name], a 89 y.o. year old female, is here today because of her back pain. [name] primary complain today is Back Pain  Pertinent problems: [name] has Fibromyalgia; Chronic pain syndrome; Lumbar radicular pain; Long term (current) use of opiate analgesic; Long term prescription opiate use; Uncomplicated opioid dependence (HCC); Opiate use; Chronic low back pain (1ry area of Pain) (Bilateral) (L>R) w/o sciatica; Grade 1 spondylolisthesis of L4 over L5; Lumbar discogenic pain syndrome; Lumbar facet syndrome (Bilateral) (L>R); Lumbar facet hypertrophy (Bilateral); Chronic sacroiliac joint pain (Left); Chronic hip pain (Left); Morbid obesity (HCC); Lumbar spondylosis; Cervical spondylosis (Anterolisthesis of C5 over C6); Chronic lower extremity pain (2ry area of Pain) (Left); Chronic lumbar radicular pain (Left) (L5 Dermatome); Osteoarthritis of hip (Left); Chronic sacroiliac joint pain (Bilateral) (R>L); Chronic shoulder pain (Left); Osteoarthritis of shoulder (Left); Chronic sacroiliac joint pain (Right); Chronic hip pain (Right); Acromioclavicular joint pain (Left); Osteoarthritis of AC (acromioclavicular) joint  (Left); Supraspinatus tendon tear, sequela (Left); Spondylosis without myelopathy or radiculopathy, lumbosacral region; Other specified dorsopathies, sacral and sacrococcygeal region; Bertolotti's syndrome (Left); Pharmacologic therapy; Disorder of skeletal system; Severe (5-6 mm) L4-5 lumbar spinal stenosis and (8 mm) L3-4 spinal stenosis.; Osteoarthritis of hip (Right); Chronic myofascial pain; Chronic low back pain (Right) w/o sciatica; Chronic hip pain (3ry area of Pain) (Bilateral) (R>L); Osteoarthritis of hips (Bilateral); Chronic low back pain (Left) w/o sciatica; Chronic use of opiate for therapeutic purpose; DDD (degenerative disc disease), thoracic; DDD (degenerative disc disease), thoracolumbar; Scoliosis of thoracolumbar region due to degenerative disease of spine in adult; At high risk for falls; Balance problem; Weakness of leg (Left); Numbness and tingling of left leg; Difficulty walking; Lumbar facet joint pain; Pseudoarthrosis of lumbar spine; Anemia in stage 3a chronic kidney disease (HCC); DDD (degenerative disc disease), lumbar w/ LEP; Chronic low back pain (Bilateral) (L>R) w/ sciatica (Left); Low back pain of over 3 months duration; Low back pain radiating to left leg; Intermittent low back pain; and Multifactorial low back pain on their pertinent problem list.  Pain Assessment: Severity of Chronic pain is reported as a 5 /10. Location: Back Lower, Left/radiates down left leg to foot on the side and crosses to front at knee to big toe. Onset: More than a month ago. Quality: Sharp, Sore (all of the above). Timing: Intermittent. Modifying factor(s): rubbing on outside of left knee. Vitals:  height is 5' 2 (1.575 m) and weight is 144 lb (65.3 kg). Her temperature is 97.3 F (36.3 C) (abnormal). Her blood pressure is 136/70 and her pulse is 71. Her respiration is 18 and oxygen saturation is 100%.  BMI: Estimated body mass index is 26.34 kg/m as calculated from the following:   Height as of  this encounter: 5' 2 (1.575 m).   Weight as of  this encounter: 144 lb (65.3 kg).  Last encounter: 07/12/2024. Last procedure: Visit date not found.  Reason for encounter: medication management.  The patient indicates doing well with current medication regimen.  No side effects or adverse reactions reported to medication.  Discussed the use of AI scribe software for clinical note transcription with the patient, who gave verbal consent to proceed.  History of Present Illness   Cynthia Bennett is an 89 year old female who presents with chronic low back pain.  She experiences chronic low back pain primarily on the left side, which has been persistent and debilitating. Initially, the pain radiated to her groin but has now shifted to her hip. She describes the pain as 'dropping, going lower or something.'  She underwent a lumbar epidural procedure on July 26, 2024, targeting the L4-L5 region on the left side, without sedation. This was approximately three months ago. She has had similar procedures in the past, sometimes scheduled back-to-back in different areas to manage her pain.  She is currently taking oxycodone  5 mg for pain management and reports no side effects or adverse reactions from this medication. She has been on this medication for a while.  She expressed concern about managing her pain through the holiday season.     Pharmacotherapy Assessment   Oxycodone  (Oxy IR/ Roxicodone ) 5 mg immediate release daily as needed for severe pain. MME=7.50  Monitoring: [place] PMP: PDMP reviewed during this encounter.       Pharmacotherapy: No side-effects or adverse reactions reported. Compliance: No problems identified. Effectiveness: Clinically acceptable.  Rebecka Wolm HERO, RN  10/10/2024 10:42 AM  Sign when Signing Visit Nursing Pain Medication Assessment:  Safety precautions to be maintained throughout the outpatient stay will include: orient to surroundings, keep bed in low position, maintain call  bell within reach at all times, provide assistance with transfer out of bed and ambulation.  Medication Inspection Compliance: Pill count conducted under aseptic conditions, in front of the patient. Neither the pills nor the bottle was removed from the patient's sight at any time. Once count was completed pills were immediately returned to the patient in their original bottle.  Medication: Oxycodone  IR Pill/Patch Count: 5 of 30 pills/patches remain Pill/Patch Appearance: Markings consistent with prescribed medication Bottle Appearance: Standard pharmacy container. Clearly labeled. Filled Date: 78 / 8 / 2025 Last Medication intake:  Today    UDS:  Summary  Date Value Ref Range Status  04/11/2024 FINAL  Final    Comment:    ==================================================================== [employer] [facility] (MW) ==================================================================== Test                             Result       Flag       Units  Drug Present and Declared for Prescription Verification   Oxycodone                       278          EXPECTED   ng/mg creat   Oxymorphone                    84           EXPECTED   ng/mg creat   Noroxycodone                   2007         EXPECTED   ng/mg creat  Sources of oxycodone  include scheduled prescription medications.    Oxymorphone and noroxycodone are expected metabolites of oxycodone .    Oxymorphone is also available as a scheduled prescription medication.  ==================================================================== Test                      Result    Flag   Units      Ref Range   Creatinine              107              mg/dL      >=79 ==================================================================== Declared Medications:  The flagging and interpretation on this report are based on the  following declared medications.  Unexpected results may arise from  inaccuracies in the declared medications.   **Note: The  testing scope of this panel includes these medications:   Oxycodone  (OxyIR)   **Note: The testing scope of this panel does not include the  following reported medications:   Acetaminophen  (Tylenol )  Chlorhexidine (Peridex)  Diclofenac (Voltaren)  Doxepin (Sinequan)  Esomeprazole (Nexium)  Eye Drops  Guaifenesin  Magnesium (Mag-Ox)  Naloxone  (Narcan )  Ondansetron  (Zofran )  Sertraline (Zoloft)  Vitamin B12  Vitamin D3  Zolpidem (Ambien) ==================================================================== For clinical consultation, please call [phone]. ====================================================================     No results found for: CBDTHCR No results found for: D8THCCBX No results found for: D9THCCBX  ROS  Constitutional: Denies any fever or chills Gastrointestinal: No reported hemesis, hematochezia, vomiting, or acute GI distress Musculoskeletal: Low back pain (L>R) Neurological: No reported episodes of acute onset apraxia, aphasia, dysarthria, agnosia, amnesia, paralysis, loss of coordination, or loss of consciousness  Medication Review  Guaifenesin, Vitamin D3, acetaminophen , aspirin EC, chlorhexidine, cyanocobalamin , diclofenac Sodium, dorzolamide-timolol, doxepin, esomeprazole, magnesium oxide, ondansetron , oxyCODONE , sertraline, and zolpidem  History Review  Allergy: Ms. Hoffert is allergic to ibuprofen. Drug: Ms. Mandile  reports no history of drug use. Alcohol:  reports no history of alcohol use. Tobacco:  reports that she has quit smoking. She has never used smokeless tobacco. Social: [name]  reports that she has quit smoking. She has never used smokeless tobacco. She reports that she does not drink alcohol and does not use drugs. Medical:  has a past medical history of Dizzy spells, Fibromyalgia, Glaucoma, Hiatal hernia, Osteoarthritis, Osteoarthritis of spine with radiculopathy, lumbar region (08/14/2015), Peptic ulcer disease, Reflux,  Spinal stenosis, Stroke (HCC), and TIA (transient ischemic attack). Surgical: Ms. Crosley  has a past surgical history that includes Esophagogastroduodenoscopy (N/A, 04/16/2015); Appendectomy; and Abdominal hysterectomy. Family: family history includes COPD in her father; Mental illness in her mother.  Laboratory Chemistry Profile   Renal Lab Results  Component Value Date   BUN 16 09/13/2024   CREATININE 1.03 (H) 09/13/2024   BCR 11 (L) 01/10/2019   GFRAA 46 (L) 01/10/2019   GFRNONAA 52 (L) 09/13/2024    Hepatic Lab Results  Component Value Date   AST 20 05/27/2024   ALT 10 05/27/2024   ALBUMIN 3.7 05/27/2024   ALKPHOS 61 05/27/2024   LIPASE 139 04/27/2014    Electrolytes Lab Results  Component Value Date   NA 135 09/13/2024   K 4.2 09/13/2024   CL 105 09/13/2024   CALCIUM 9.2 09/13/2024   MG 2.3 01/10/2019    Bone Lab Results  Component Value Date   25OHVITD1 26 (L) 01/10/2019   25OHVITD2 1.6 01/10/2019   25OHVITD3 24 01/10/2019    Inflammation (CRP: Acute Phase) (ESR: Chronic Phase) Lab Results  Component Value Date   CRP 0.9 08/24/2020   ESRSEDRATE 51 (H) 01/10/2019         Note: Above Lab results reviewed.  Recent Imaging Review  DG PAIN CLINIC C-ARM 1-60 MIN NO REPORT Fluoro was used, but no Radiologist interpretation will be provided.  Please refer to NOTES tab for provider progress note. Note: Reviewed        Physical Exam  Vitals: BP 136/70   Pulse 71   Temp (!) 97.3 F (36.3 C)   Resp 18   Ht 5' 2 (1.575 m)   Wt 144 lb (65.3 kg)   SpO2 100%   BMI 26.34 kg/m  BMI: Estimated body mass index is 26.34 kg/m as calculated from the following:   Height as of this encounter: 5' 2 (1.575 m).   Weight as of this encounter: 144 lb (65.3 kg). Ideal: Ideal body weight: 50.1 kg (110 lb 7.2 oz) Adjusted ideal body weight: 56.2 kg (123 lb 13.9 oz) General appearance: Well nourished, well developed, and well hydrated. In no apparent acute distress Mental  status: Alert, oriented x 3 (person, place, & time)       Respiratory: No evidence of acute respiratory distress Eyes: PERLA  Musculoskeletal: +LBP  Lumbar Exam  Skin & Axial Inspection: No masses, redness, or swelling Alignment: Symmetrical Functional ROM: Pain restricted ROM affecting primarily the left Stability: No instability detected Muscle Tone/Strength: Functionally intact. No obvious neuro-muscular anomalies detected. Sensory (Neurological): Neuropathic pain pattern Palpation: No palpable anomalies       Provocative Tests: Hyperextension/rotation test: deferred today       Lumbar quadrant test (Kemp's test): deferred today       Lateral bending test: deferred today       Patrick's Maneuver: (+) for left-sided S-I arthralgia and  FABER* test: (+) for left-sided S-I arthralgia  S-I anterior distraction/compression test: (+) Left-sided S-I arthralgia/arthropathy S-I lateral compression test: (+) Left-sided S-I arthralgia/arthropathy S-I Thigh-thrust test: deferred today         S-I Gaenslen's test: deferred today         *(Flexion, ABduction and External Rotation) Assessment   Diagnosis Status  1. Chronic bilateral low back pain without sciatica   2. Chronic pain of left lower extremity   3. Chronic use of opiate for therapeutic purpose   4. Chronic pain syndrome   5. Chronic pain of both hips   6. Spinal stenosis, lumbar region, with neurogenic claudication   7. Bertolotti's syndrome   8. Facet hypertrophy of lumbar region   9. Spondylolisthesis of lumbar region   10. Chronic radicular lumbar pain   11. Scoliosis of thoracolumbar region due to degenerative disease of spine in adult   12. DDD (degenerative disc disease), thoracolumbar    Having a Flare-up Having a Flare-up Controlled   Updated Problems: No problems updated.  Plan of Care  Problem-specific:  Assessment and Plan    Chronic low back pain with left-sided lumbar radiculopathy Chronic low back pain  with left-sided lumbar radiculopathy, primarily affecting the left side and extending to the hip. Previous lumbar epidural at L4-L5 on the left side performed on September 16th, 2025. - Scheduled lumbar epidural before Christmas. - Continue oxycodone  5 mg as prescribed.  Chronic pain syndrome Managed with oxycodone . No reported side effects or adverse reactions from oxycodone . - Continue current pain management regimen with oxycodone .  Patient's pain is controlled with oxycodone , will continue on current medication regimen.  Prescribing drug monitoring (PDMP) reviewed, findings  consistent with the use of prescribed medication and no evidence of narcotic misuse or abuse.  Urine drug screening (UDS) up to date.  No side effects or adverse reaction reported to medication.  Schedule follow-up in 90 days for medication management.  Chronic use of opioid therapeutic purpose: Meds ordered this encounter  Medications   oxyCODONE  (OXY IR/ROXICODONE ) 5 MG immediate release tablet    Sig: Take 1 tablet (5 mg total) by mouth daily as needed for severe pain (pain score 7-10). Must last 30 days    Dispense:  30 tablet    Refill:  0    DO NOT: delete (not duplicate); no partial-fill (will deny script to complete), no refill request (F/U required). DISPENSE: 1 day early if closed on fill date. WARN: No CNS-depressants within 8 hrs of med.   oxyCODONE  (OXY IR/ROXICODONE ) 5 MG immediate release tablet    Sig: Take 1 tablet (5 mg total) by mouth daily as needed for severe pain (pain score 7-10). Must last 30 days    Dispense:  30 tablet    Refill:  0    DO NOT: delete (not duplicate); no partial-fill (will deny script to complete), no refill request (F/U required). DISPENSE: 1 day early if closed on fill date. WARN: No CNS-depressants within 8 hrs of med.   oxyCODONE  (OXY IR/ROXICODONE ) 5 MG immediate release tablet    Sig: Take 1 tablet (5 mg total) by mouth daily as needed for severe pain (pain score 7-10).  Must last 30 days    Dispense:  30 tablet    Refill:  0    DO NOT: delete (not duplicate); no partial-fill (will deny script to complete), no refill request (F/U required). DISPENSE: 1 day early if closed on fill date. WARN: No CNS-depressants within 8 hrs of med.        Ms. HEILY CARLUCCI has a current medication list which includes the following long-term medication(s): doxepin, zolpidem, [START ON 10/17/2024] oxycodone , [START ON 11/16/2024] oxycodone , and [START ON 12/16/2024] oxycodone .  Pharmacotherapy (Medications Ordered): Meds ordered this encounter  Medications   oxyCODONE  (OXY IR/ROXICODONE ) 5 MG immediate release tablet    Sig: Take 1 tablet (5 mg total) by mouth daily as needed for severe pain (pain score 7-10). Must last 30 days    Dispense:  30 tablet    Refill:  0    DO NOT: delete (not duplicate); no partial-fill (will deny script to complete), no refill request (F/U required). DISPENSE: 1 day early if closed on fill date. WARN: No CNS-depressants within 8 hrs of med.   oxyCODONE  (OXY IR/ROXICODONE ) 5 MG immediate release tablet    Sig: Take 1 tablet (5 mg total) by mouth daily as needed for severe pain (pain score 7-10). Must last 30 days    Dispense:  30 tablet    Refill:  0    DO NOT: delete (not duplicate); no partial-fill (will deny script to complete), no refill request (F/U required). DISPENSE: 1 day early if closed on fill date. WARN: No CNS-depressants within 8 hrs of med.   oxyCODONE  (OXY IR/ROXICODONE ) 5 MG immediate release tablet    Sig: Take 1 tablet (5 mg total) by mouth daily as needed for severe pain (pain score 7-10). Must last 30 days    Dispense:  30 tablet    Refill:  0    DO NOT: delete (not duplicate); no partial-fill (will deny script to complete), no refill request (F/U required). DISPENSE: 1 day early  if closed on fill date. WARN: No CNS-depressants within 8 hrs of med.   Orders:  No orders of the defined types were placed in this encounter.        Return in about 3 months (around 01/08/2025) for (F2F), (MM), Emmy Blanch NP.    Recent Visits Date Type Provider Dept  08/10/24 Office Visit Tanya Glisson, MD [facility]  07/26/24 Procedure visit Tanya Glisson, MD [facility]  07/12/24 Office Visit Aleric Froelich K, NP [facility]  Showing recent visits within past 90 days and meeting all other requirements Today's Visits Date Type Provider Dept  10/10/24 Office Visit Karmen Altamirano K, NP [facility]  Showing today's visits and meeting all other requirements Future Appointments Date Type Provider Dept  10/20/24 Appointment Tanya Glisson, MD [facility]  01/05/25 Appointment Nyjah Schwake K, NP [facility]  Showing future appointments within next 90 days and meeting all other requirements  I discussed the assessment and treatment plan with the patient. The patient was provided an opportunity to ask questions and all were answered. The patient agreed with the plan and demonstrated an understanding of the instructions.  Patient advised to call back or seek an in-person evaluation if the symptoms or condition worsens.  I personally spent a total of 30 minutes in the care of the patient today including preparing to see the patient, getting/reviewing separately obtained history, performing a medically appropriate exam/evaluation, counseling and educating, placing orders, referring and communicating with other health care professionals, documenting clinical information in the EHR, independently interpreting results, communicating results, and coordinating care.   Note by: Hassaan Crite K Ginevra Tacker, NP (TTS and AI technology used. I apologize for any typographical errors that were not detected and corrected.) Date: 10/10/2024; Time: 12:47 PM

## 2024-10-10 NOTE — Progress Notes (Signed)
 Nursing Pain Medication Assessment:  Safety precautions to be maintained throughout the outpatient stay will include: orient to surroundings, keep bed in low position, maintain call bell within reach at all times, provide assistance with transfer out of bed and ambulation.  Medication Inspection Compliance: Pill count conducted under aseptic conditions, in front of the patient. Neither the pills nor the bottle was removed from the patient's sight at any time. Once count was completed pills were immediately returned to the patient in their original bottle.  Medication: Oxycodone  IR Pill/Patch Count: 5 of 30 pills/patches remain Pill/Patch Appearance: Markings consistent with prescribed medication Bottle Appearance: Standard pharmacy container. Clearly labeled. Filled Date: 48 / 8 / 2025 Last Medication intake:  Today

## 2024-10-13 DIAGNOSIS — H04123 Dry eye syndrome of bilateral lacrimal glands: Secondary | ICD-10-CM | POA: Diagnosis not present

## 2024-10-13 DIAGNOSIS — H401133 Primary open-angle glaucoma, bilateral, severe stage: Secondary | ICD-10-CM | POA: Diagnosis not present

## 2024-10-13 DIAGNOSIS — Z961 Presence of intraocular lens: Secondary | ICD-10-CM | POA: Diagnosis not present

## 2024-10-20 ENCOUNTER — Ambulatory Visit: Admitting: Pain Medicine

## 2024-10-20 ENCOUNTER — Encounter: Payer: Self-pay | Admitting: Pain Medicine

## 2024-10-20 ENCOUNTER — Ambulatory Visit
Admission: RE | Admit: 2024-10-20 | Discharge: 2024-10-20 | Disposition: A | Discharge status: Home / Self Care | Source: Ambulatory Visit | Attending: Pain Medicine | Admitting: Pain Medicine

## 2024-10-20 VITALS — BP 123/73 | HR 75 | Temp 97.3°F | Resp 20 | Ht 62.0 in | Wt 142.0 lb

## 2024-10-20 DIAGNOSIS — M5416 Radiculopathy, lumbar region: Secondary | ICD-10-CM | POA: Diagnosis present

## 2024-10-20 DIAGNOSIS — M51361 Other intervertebral disc degeneration, lumbar region with lower extremity pain only: Secondary | ICD-10-CM

## 2024-10-20 DIAGNOSIS — M5442 Lumbago with sciatica, left side: Secondary | ICD-10-CM | POA: Diagnosis not present

## 2024-10-20 DIAGNOSIS — R29898 Other symptoms and signs involving the musculoskeletal system: Secondary | ICD-10-CM | POA: Diagnosis present

## 2024-10-20 DIAGNOSIS — M4316 Spondylolisthesis, lumbar region: Secondary | ICD-10-CM

## 2024-10-20 DIAGNOSIS — R54 Age-related physical debility: Secondary | ICD-10-CM | POA: Insufficient documentation

## 2024-10-20 DIAGNOSIS — M48062 Spinal stenosis, lumbar region with neurogenic claudication: Secondary | ICD-10-CM

## 2024-10-20 DIAGNOSIS — M5136 Other intervertebral disc degeneration, lumbar region with discogenic back pain only: Secondary | ICD-10-CM

## 2024-10-20 DIAGNOSIS — M545 Low back pain, unspecified: Secondary | ICD-10-CM | POA: Diagnosis present

## 2024-10-20 DIAGNOSIS — G8929 Other chronic pain: Secondary | ICD-10-CM | POA: Diagnosis present

## 2024-10-20 DIAGNOSIS — M79605 Pain in left leg: Secondary | ICD-10-CM | POA: Insufficient documentation

## 2024-10-20 MED ORDER — TRIAMCINOLONE ACETONIDE 40 MG/ML IJ SUSP
40.0000 mg | Freq: Once | INTRAMUSCULAR | Status: AC
  Administered 2024-10-20: 40 mg
  Filled 2024-10-20: qty 1

## 2024-10-20 MED ORDER — SODIUM CHLORIDE (PF) 0.9 % IJ SOLN
INTRAMUSCULAR | Status: AC
  Filled 2024-10-20: qty 10

## 2024-10-20 MED ORDER — ROPIVACAINE HCL 2 MG/ML IJ SOLN
2.0000 mL | Freq: Once | INTRAMUSCULAR | Status: AC
  Administered 2024-10-20: 2 mL via EPIDURAL
  Filled 2024-10-20: qty 20

## 2024-10-20 MED ORDER — IOHEXOL 180 MG/ML  SOLN
10.0000 mL | Freq: Once | INTRAMUSCULAR | Status: AC
  Administered 2024-10-20: 10 mL via EPIDURAL
  Filled 2024-10-20: qty 20

## 2024-10-20 MED ORDER — PENTAFLUOROPROP-TETRAFLUOROETH EX AERO
INHALATION_SPRAY | Freq: Once | CUTANEOUS | Status: AC
  Administered 2024-10-20: 30 via TOPICAL

## 2024-10-20 MED ORDER — LIDOCAINE HCL 2 % IJ SOLN
20.0000 mL | Freq: Once | INTRAMUSCULAR | Status: AC
  Administered 2024-10-20: 400 mg
  Filled 2024-10-20: qty 20

## 2024-10-20 MED ORDER — SODIUM CHLORIDE 0.9% FLUSH
2.0000 mL | Freq: Once | INTRAVENOUS | Status: AC
  Administered 2024-10-20: 2 mL

## 2024-10-20 NOTE — Progress Notes (Signed)
 Safety precautions to be maintained throughout the outpatient stay will include: orient to surroundings, keep bed in low position, maintain call bell within reach at all times, provide assistance with transfer out of bed and ambulation.

## 2024-10-20 NOTE — Progress Notes (Signed)
 PROVIDER NOTE: Interpretation of information contained herein should be left to medically-trained personnel. Specific patient instructions are provided elsewhere under Patient Instructions section of medical record. This document was created in part using STT-dictation technology, any transcriptional errors that may result from this process are unintentional.  Patient: Cynthia Bennett Type: Established DOB: 02/15/1933 MRN: [id number] PCP: Valora Lynwood FALCON, MD  Service: Procedure DOS: 10/20/2024 Setting: Ambulatory Location: Ambulatory outpatient facility Delivery: Face-to-face Provider: Eric DELENA Como, MD Specialty: Interventional Pain Management Specialty designation: 09 Location: Outpatient facility Ref. Prov.: Como Eric, MD       Interventional Therapy   Type: Lumbar epidural steroid injection (LESI) (interlaminar) #11    Laterality: Left   Level:  L4-5 Level.  Imaging: Fluoroscopic guidance         Anesthesia: Local anesthesia (1-2% Lidocaine ) Anxiolysis: None                            Sedation: No Sedation                       DOS: 10/20/2024  Performed by: Eric DELENA Como, MD  Purpose: Diagnostic/Therapeutic Indications: Lumbar radicular pain of intraspinal etiology of more than 4 weeks that has failed to respond to conservative therapy and is severe enough to impact quality of life or function. 1. Chronic low back pain (Bilateral) (L>R) w/ sciatica (Left)   2. Chronic lower extremity pain (2ry area of Pain) (Left)   3. Chronic lumbar radicular pain (Left) (L5 Dermatome)   4. DDD (degenerative disc disease), lumbar w/ LEP   5. Grade 1 spondylolisthesis of L4 over L5   6. Low back pain of over 3 months duration   7. Low back pain radiating to left leg   8. Lumbar discogenic pain syndrome   9. Multifactorial low back pain   10. Severe (5-6 mm) L4-5 lumbar spinal stenosis and (8 mm) L3-4 spinal stenosis.   11. Recurrent low back pain   12. Intermittent low  back pain   13. Advanced age    NAS-11 Pain score:   Pre-procedure: 6 /10   Post-procedure: 6 /10      Position / Prep / Materials:  Position: Prone w/ head of the table raised (slight reverse trendelenburg) to facilitate breathing.  Prep solution: ChloraPrep (2% chlorhexidine gluconate and 70% isopropyl alcohol) Prep Area: Entire Posterior Lumbar Region from lower scapular tip down to mid buttocks area and from flank to flank. Materials:  Tray: Epidural tray Needle(s):  Type: Epidural needle (Tuohy) Gauge (G):  17 Length: Regular (3.5-in) Qty: 1  H&P (Pre-op Assessment):  Ms. Stoll is a 89 y.o. (year old), female patient, seen today for interventional treatment. She  has a past surgical history that includes Esophagogastroduodenoscopy (N/A, 04/16/2015); Appendectomy; and Abdominal hysterectomy. Ms. Garofano has a current medication list which includes the following prescription(s): acetaminophen , aspirin ec, chlorhexidine, vitamin d3, cyanocobalamin , diclofenac sodium, dorzolamide-timolol, doxepin, esomeprazole, guaifenesin, magnesium oxide, ondansetron , oxycodone , [START ON 11/16/2024] oxycodone , [START ON 12/16/2024] oxycodone , sertraline, and zolpidem. Her primarily concern today is the Back Pain  Initial Vital Signs:  Pulse/HCG Rate: 75ECG Heart Rate: 68 Temp: (!) 97.3 F (36.3 C) Resp: 16 BP: (!) 119/56 SpO2: 97 %  BMI: Estimated body mass index is 25.97 kg/m as calculated from the following:   Height as of this encounter: 5' 2 (1.575 m).   Weight as of this encounter: 142 lb (64.4 kg).  Risk  Assessment: Allergies: Reviewed. She is allergic to ibuprofen.  Allergy Precautions: None required Coagulopathies: Reviewed. None identified.  Blood-thinner therapy: None at this time Active Infection(s): Reviewed. None identified. Ms. Parada is afebrile  Site Confirmation: Ms. Rossy was asked to confirm the procedure and laterality before marking the site Procedure checklist:  Completed Consent: Before the procedure and under the influence of no sedative(s), amnesic(s), or anxiolytics, the patient was informed of the treatment options, risks and possible complications. To fulfill our ethical and legal obligations, as recommended by the American Medical Association's Code of Ethics, I have informed the patient of my clinical impression; the nature and purpose of the treatment or procedure; the risks, benefits, and possible complications of the intervention; the alternatives, including doing nothing; the risk(s) and benefit(s) of the alternative treatment(s) or procedure(s); and the risk(s) and benefit(s) of doing nothing. The patient was provided information about the general risks and possible complications associated with the procedure. These may include, but are not limited to: failure to achieve desired goals, infection, bleeding, organ or nerve damage, allergic reactions, paralysis, and death. In addition, the patient was informed of those risks and complications associated to Spine-related procedures, such as failure to decrease pain; infection (i.e.: Meningitis, epidural or intraspinal abscess); bleeding (i.e.: epidural hematoma, subarachnoid hemorrhage, or any other type of intraspinal or peri-dural bleeding); organ or nerve damage (i.e.: Any type of peripheral nerve, nerve root, or spinal cord injury) with subsequent damage to sensory, motor, and/or autonomic systems, resulting in permanent pain, numbness, and/or weakness of one or several areas of the body; allergic reactions; (i.e.: anaphylactic reaction); and/or death. Furthermore, the patient was informed of those risks and complications associated with the medications. These include, but are not limited to: allergic reactions (i.e.: anaphylactic or anaphylactoid reaction(s)); adrenal axis suppression; blood sugar elevation that in diabetics may result in ketoacidosis or comma; water retention that in patients with history  of congestive heart failure may result in shortness of breath, pulmonary edema, and decompensation with resultant heart failure; weight gain; swelling or edema; medication-induced neural toxicity; particulate matter embolism and blood vessel occlusion with resultant organ, and/or nervous system infarction; and/or aseptic necrosis of one or more joints. Finally, the patient was informed that Medicine is not an exact science; therefore, there is also the possibility of unforeseen or unpredictable risks and/or possible complications that may result in a catastrophic outcome. The patient indicated having understood very clearly. We have given the patient no guarantees and we have made no promises. Enough time was given to the patient to ask questions, all of which were answered to the patient's satisfaction. Ms. Streeter has indicated that she wanted to continue with the procedure. Attestation: I, the ordering provider, attest that I have discussed with the patient the benefits, risks, side-effects, alternatives, likelihood of achieving goals, and potential problems during recovery for the procedure that I have provided informed consent. Date  Time: 10/20/2024 11:42 AM  Pre-Procedure Preparation:  Monitoring: As per clinic protocol. Respiration, ETCO2, SpO2, BP, heart rate and rhythm monitor placed and checked for adequate function Safety Precautions: Patient was assessed for positional comfort and pressure points before starting the procedure. Time-out: I initiated and conducted the Time-out before starting the procedure, as per protocol. The patient was asked to participate by confirming the accuracy of the Time Out information. Verification of the correct person, site, and procedure were performed and confirmed by me, the nursing staff, and the patient. Time-out conducted as per Joint Commission's Universal Protocol (UP.01.01.01). Time:  1229 Start Time: 1229 hrs.  Description/Narrative of Procedure:           Target: Epidural space via interlaminar opening, initially targeting the lower laminar border of the superior vertebral body. Region: Lumbar Approach: Percutaneous paravertebral  Rationale (medical necessity): procedure needed and proper for the diagnosis and/or treatment of the patient's medical symptoms and needs. Procedural Technique Safety Precautions: Aspiration looking for blood return was conducted prior to all injections. At no point did we inject any substances, as a needle was being advanced. No attempts were made at seeking any paresthesias. Safe injection practices and needle disposal techniques used. Medications properly checked for expiration dates. SDV (single dose vial) medications used. Description of the Procedure: Protocol guidelines were followed. The procedure needle was introduced through the skin, ipsilateral to the reported pain, and advanced to the target area. Bone was contacted and the needle walked caudad, until the lamina was cleared. The epidural space was identified using loss-of-resistance technique with 2-3 ml of PF-NaCl (0.9% NSS), in a 5cc LOR glass syringe.  Vitals:   10/20/24 1225 10/20/24 1230 10/20/24 1235 10/20/24 1238  BP: (!) 148/72 136/68 127/67 123/73  Pulse:      Resp: 16 20 20 20   Temp:      SpO2: 99% 100% 98% 99%  Weight:      Height:        Start Time: 1229 hrs. End Time: 1237 hrs.  Imaging Guidance (Spinal):          Type of Imaging Technique: Fluoroscopy Guidance (Spinal) Indication(s): Fluoroscopy guidance for needle placement to enhance accuracy in procedures requiring precise needle localization for targeted delivery of medication in or near specific anatomical locations not easily accessible without such real-time imaging assistance. Exposure Time: Please see nurses notes. Contrast: Before injecting any contrast, we confirmed that the patient did not have an allergy to iodine, shellfish, or radiological contrast. Once  satisfactory needle placement was completed at the desired level, radiological contrast was injected. Contrast injected under live fluoroscopy. No contrast complications. See chart for type and volume of contrast used. Fluoroscopic Guidance: I was personally present during the use of fluoroscopy. Tunnel Vision Technique used to obtain the best possible view of the target area. Parallax error corrected before commencing the procedure. Direction-depth-direction technique used to introduce the needle under continuous pulsed fluoroscopy. Once target was reached, antero-posterior, oblique, and lateral fluoroscopic projection used confirm needle placement in all planes. Images permanently stored in EMR. Interpretation: I personally interpreted the imaging intraoperatively. Adequate needle placement confirmed in multiple planes. Appropriate spread of contrast into desired area was observed. No evidence of afferent or efferent intravascular uptake. No intrathecal or subarachnoid spread observed. Permanent images saved into the patient's record.  Antibiotic Prophylaxis:   Anti-infectives (From admission, onward)    None      Indication(s): None identified  Post-operative Assessment:  Post-procedure Vital Signs:  Pulse/HCG Rate: 7573 Temp: (!) 97.3 F (36.3 C) Resp: 20 BP: 123/73 SpO2: 99 %  EBL: None  Complications: No immediate post-treatment complications observed by team, or reported by patient.  Note: The patient tolerated the entire procedure well. A repeat set of vitals were taken after the procedure and the patient was kept under observation following institutional policy, for this type of procedure. Post-procedural neurological assessment was performed, showing return to baseline, prior to discharge. The patient was provided with post-procedure discharge instructions, including a section on how to identify potential problems. Should any problems arise concerning this procedure, the  patient was given instructions to immediately contact us , at any time, without hesitation. In any case, we plan to contact the patient by telephone for a follow-up status report regarding this interventional procedure.  Comments:  No additional relevant information.  Plan of Care (POC)  Orders:  Orders Placed This Encounter  Procedures   Lumbar Epidural Injection    Scheduling Instructions:     Procedure: Interlaminar LESI L4-5     Laterality: Left     Sedation: No Sedation     Date: 10/20/2024    Where will this procedure be performed?:   [facility]   [facility] C-ARM 1-60 MIN NO REPORT    Intraoperative interpretation by procedural physician at [facility].    Standing Status:   Standing    Number of Occurrences:   1    Reason for exam::   Assistance in needle guidance and placement for procedures requiring needle placement in or near specific anatomical locations not easily accessible without such assistance.   Informed Consent Details: Physician/Practitioner Attestation; Transcribe to consent form and obtain patient signature    Note: Always confirm laterality of pain with Ms. Mancebo, before procedure. Transcribe to consent form and obtain patient signature.    Physician/Practitioner attestation of informed consent for procedure/surgical case:   I, the physician/practitioner, attest that I have discussed with the patient the benefits, risks, side effects, alternatives, likelihood of achieving goals and potential problems during recovery for the procedure that I have provided informed consent.    Procedure:   Lumbar epidural steroid injection under fluoroscopic guidance    Physician/Practitioner performing the procedure:   Braden Cimo A. Tanya, MD    Indication/Reason:   Low back and/or lower extremity pain secondary to lumbar radiculitis   Provide equipment / supplies at bedside    Procedural tray: Epidural Tray (Disposable  single use) Skin infiltration  needle: Regular 1.5-in, 25-G, (x1) Block needle size: Regular standard Catheter: No catheter required    Standing Status:   Standing    Number of Occurrences:   1    Specify:   Epidural Tray     Oxycodone  IR 5 mg, 1 tab PO daily (maximum of 5 mg/day of oxycodone .) MME/day: 7.5 mg/day.    Medications ordered for procedure: Meds ordered this encounter  Medications   iohexol  (OMNIPAQUE ) 180 MG/ML injection 10 mL    Must be Myelogram-compatible. If not available, you may substitute with a water-soluble, non-ionic, hypoallergenic, myelogram-compatible radiological contrast medium.   lidocaine  (XYLOCAINE ) 2 % (with pres) injection 400 mg   pentafluoroprop-tetrafluoroeth (GEBAUERS) aerosol   sodium chloride  flush (NS) 0.9 % injection 2 mL   ropivacaine  (PF) 2 mg/mL (0.2%) (NAROPIN ) injection 2 mL   triamcinolone  acetonide (KENALOG -40) injection 40 mg   Medications administered: We administered iohexol , lidocaine , pentafluoroprop-tetrafluoroeth, sodium chloride  flush, ropivacaine  (PF) 2 mg/mL (0.2%), and triamcinolone  acetonide.  See the medical record for exact dosing, route, and time of administration.    Interventional Therapies  Risk Factors  Complex Considerations:  Advanced age   Planned  Pending:   Palliative left L4-5 LESI #9    Under consideration:   Palliative left L4-5 and L5-S1 lumbar facet MBB  Possible lumbar facet RFA    Completed:   Diagnostic left L5-S1 pseudoarthrosis inj. #1 (01/09/2022) (10/10 to 0/10)  Therapeutic left L4-5 LESI x8 (06/25/2021) (100/100/80/90)  Therapeutic right L4-5 LESI x1 (07/21/2017) (DNR-F/U)  Palliative right L5-S1 LESI x1 (09/27/2015) (100/100/90)  Therapeutic left L5-S1 LESI x1 (02/05/2016) (100/100/100/100)  Diagnostic right SI joint injection x2 (08/25/2019) (100/100/40/30)  Diagnostic left SI joint injection x2 (01/27/2019) (100/100/100/100)  Diagnostic left glenohumeral joint inj. x1 (04/29/2017) (100/100/98/98)  Left biceps  muscle trigger point inj. x1 (07/21/2017) (DNR-F/U)  Right PSIS trigger point inj. x1 (07/27/2018) (100/100/50/>50)  Diagnostic right lumbar facet MBB x3 (01/09/2022) (10/10 to 0/10)  Diagnostic left L4-5 and L5-S1 lumbar facet MBB x5 (09/29/2023) (100/100/100/100)  Diagnostic left IA hip inj. x1 (01/27/2019) (100/100/100/100)    Completed by other providers:   None at this time   Therapeutic  Palliative (PRN) options:   None established       Follow-up plan:   Return in about 2 weeks (around 11/03/2024) for (Face2F), (PPE).     Recent Visits Date Type Provider Dept  10/10/24 Office Visit Patel, Seema K, NP [facility]  08/10/24 Office Visit Tanya Glisson, MD [facility]  07/26/24 Procedure visit Tanya Glisson, MD [facility]  Showing recent visits within past 90 days and meeting all other requirements Today's Visits Date Type Provider Dept  10/20/24 Procedure visit Tanya Glisson, MD [facility]  Showing today's visits and meeting all other requirements Future Appointments Date Type Provider Dept  01/05/25 Appointment Patel, Seema K, NP [facility]  Showing future appointments within next 90 days and meeting all other requirements   Disposition: Discharge home  Discharge (Date  Time): 10/20/2024; 1239 hrs.   Primary Care Physician: Valora Lynwood FALCON, MD Location: [facility] Note by: Glisson DELENA Tanya, MD (TTS technology used. I apologize for any typographical errors that were not detected and corrected.) Date: 10/20/2024; Time: 12:42 PM  Disclaimer:  Medicine is not an visual merchandiser. The only guarantee in medicine is that nothing is guaranteed. It is important to note that the decision to proceed with this intervention was based on the information collected from the patient. The Data and conclusions were drawn from the patient's questionnaire, the interview, and the physical  examination. Because the information was provided in large part by the patient, it cannot be guaranteed that it has not been purposely or unconsciously manipulated. Every effort has been made to obtain as much relevant data as possible for this evaluation. It is important to note that the conclusions that lead to this procedure are derived in large part from the available data. Always take into account that the treatment will also be dependent on availability of resources and existing treatment guidelines, considered by other Pain Management Practitioners as being common knowledge and practice, at the time of the intervention. For Medico-Legal purposes, it is also important to point out that variation in procedural techniques and pharmacological choices are the acceptable norm. The indications, contraindications, technique, and results of the above procedure should only be interpreted and judged by a Board-Certified Interventional Pain Specialist with extensive familiarity and expertise in the same exact procedure and technique.

## 2024-10-20 NOTE — Patient Instructions (Signed)
 ______________________________________________________________________    Post-Procedure Discharge Instructions  INSTRUCTIONS Apply ice:  Purpose: This will minimize any swelling and discomfort after procedure.  When: Day of procedure, as soon as you get home. How: Fill a plastic sandwich bag with crushed ice. Cover it with a small towel and apply to injection site. How long: (15 min on, 15 min off) Apply for 15 minutes then remove x 15 minutes.  Repeat sequence on day of procedure, until you go to bed. Apply heat:  Purpose: To treat any soreness and discomfort from the procedure. When: Starting the next day after the procedure. How: Apply heat to procedure site starting the day following the procedure. How long: May continue to repeat daily, until discomfort goes away. Food intake: Start with clear liquids (like water) and advance to regular food, as tolerated.  Physical activities: Keep activities to a minimum for the first 8 hours after the procedure. After that, then as tolerated. Driving: If you have received any sedation, be responsible and do not drive. You are not allowed to drive for 24 hours after having sedation. Blood thinner: (Applies only to those taking blood thinners) You may restart your blood thinner 6 hours after your procedure. Insulin: (Applies only to Diabetic patients taking insulin) As soon as you can eat, you may resume your normal dosing schedule. Infection prevention: Keep procedure site clean and dry. Shower daily and clean area with soap and water.  PAIN DIARY Post-procedure Pain Diary: Extremely important that this be done correctly and accurately. Recorded information will be used to determine the next step in treatment. For the purpose of accuracy, follow these rules: Evaluate only the area treated. Do not report or include pain from an untreated area. For the purpose of this evaluation, ignore all other areas of pain, except for the treated area. After your  procedure, avoid taking a long nap and attempting to complete the pain diary after you wake up. Instead, set your alarm clock to go off every hour, on the hour, for the initial 8 hours after the procedure. Document the duration of the numbing medicine, and the relief you are getting from it. Do not go to sleep and attempt to complete it later. It will not be accurate. If you received sedation, it is likely that you were given a medication that may cause amnesia. Because of this, completing the diary at a later time may cause the information to be inaccurate. This information is needed to plan your care. Follow-up appointment: Keep your post-procedure follow-up evaluation appointment after the procedure (usually 2 weeks for most procedures, 6 weeks for radiofrequencies). DO NOT FORGET to bring you pain diary with you.   EXPECT... (What should I expect to see with my procedure?) From numbing medicine (AKA: Local Anesthetics): Numbness or decrease in pain. You may also experience some weakness, which if present, could last for the duration of the local anesthetic. Onset: Full effect within 15 minutes of injected. Duration: It will depend on the type of local anesthetic used. On the average, 1 to 8 hours.  From steroids (Applies only if steroids were used): Decrease in swelling or inflammation. Once inflammation is improved, relief of the pain will follow. Onset of benefits: Depends on the amount of swelling present. The more swelling, the longer it will take for the benefits to be seen. In some cases, up to 10 days. Duration: Steroids will stay in the system x 2 weeks. Duration of benefits will depend on multiple posibilities including persistent irritating  factors. Side-effects: If present, they may typically last 2 weeks (the duration of the steroids). Frequent: Cramps (if they occur, drink Gatorade and take over-the-counter Magnesium 450-500 mg once to twice a day); water retention with temporary weight  gain; increases in blood sugar; decreased immune system response; increased appetite. Occasional: Facial flushing (red, warm cheeks); mood swings; menstrual changes. Uncommon: Long-term decrease or suppression of natural hormones; bone thinning. (These are more common with higher doses or more frequent use. This is why we prefer that our patients avoid having any injection therapies in other practices.)  Very Rare: Severe mood changes; psychosis; aseptic necrosis. From procedure: Some discomfort is to be expected once the numbing medicine wears off. This should be minimal if ice and heat are applied as instructed.  CALL IF... (When should I call?) You experience numbness and weakness that gets worse with time, as opposed to wearing off. New onset bowel or bladder incontinence. (Applies only to procedures done in the spine)  Emergency Numbers: Durning business hours (Monday - Thursday, 8:00 AM - 4:00 PM) (Friday, 9:00 AM - 12:00 Noon): [phone] After hours: [phone] NOTE: If you are having a problem and are unable connect with, or to talk to a provider, then go to your nearest urgent care or emergency department. If the problem is serious and urgent, please call 911. ______________________________________________________________________     ______________________________________________________________________    Steroid injections  Common steroids for injections Triamcinolone : Used by many sports medicine physicians for large joint and bursal injections, often combined with a local anesthetic like lidocaine . A study focusing on coccydynia (tailbone pain) found triamcinolone  was more effective than betamethasone, suggesting it may also be preferable for other localized inflammation conditions. Methylprednisolone: A common alternative to triamcinolone  that is also a strong anti-inflammatory. It is available in different formulations, with the acetate suspension being the long-acting  option for intra-articular injections. Dexamethasone : This is a non-particulate steroid, meaning it has a lower risk of tissue damage compared to particulate steroids like triamcinolone  and methylprednisolone. While less common for this specific use, it is an option for targeted injections.   Considerations for physicians Particulate vs. non-particulate steroids: Triamcinolone  and methylprednisolone are particulate, meaning they can clump together. Dexamethasone  is non-particulate. Particulate steroids are often preferred for their longer-lasting effects but carry a theoretical higher risk for certain injections (though this is less of a concern in the costochondral joints). Combined injectate: Corticosteroids are typically mixed with a local anesthetic like lidocaine  to provide both immediate pain relief (from the anesthetic) and longer-term inflammation reduction (from the steroid). Imaging guidance: To ensure accurate placement of the needle and medication, physicians may use ultrasound or fluoroscopic guidance for the injection, especially in complex or refractory cases.   Patient guidance Before undergoing a steroid injection, discuss the options with your physician. They will determine the best steroid, dosage, and procedure for your specific case based on factors like: Severity of your condition History of response to other treatments Your overall health status Experience and preference of the physician  Last  Updated: 07/05/2024 ______________________________________________________________________

## 2024-10-21 ENCOUNTER — Telehealth: Payer: Self-pay

## 2024-10-21 NOTE — Telephone Encounter (Signed)
 Post procedure call, patient reports a little bit of pain in her back  last night, but feels good this morning. Encouraged to call with questions or concerns.

## 2024-11-16 ENCOUNTER — Ambulatory Visit: Admitting: Pain Medicine

## 2024-11-29 NOTE — Progress Notes (Unsigned)
 PROVIDER NOTE: Interpretation of information contained herein should be left to medically-trained personnel. Specific patient instructions are provided elsewhere under Patient Instructions section of medical record. This document was created in part using AI and STT-dictation technology, any transcriptional errors that may result from this process are unintentional.  Patient: Cynthia Bennett  Service: E/M   PCP: Cynthia Lynwood FALCON, MD  DOB: 06/02/1933  DOS: 11/30/2024  Provider: Eric DELENA Como, MD  MRN: [id number]  Delivery: Face-to-face  Specialty: Interventional Pain Management  Type: Established Patient  Setting: Ambulatory outpatient facility  Specialty designation: 09  Referring Prov.: Cynthia Lynwood FALCON, MD  Location: Outpatient office facility       History of present illness (HPI) [name], a 89 y.o. year old female, is here today because of her Chronic bilateral low back pain with left-sided sciatica [G89.29, M54.42]. Cynthia Bennett primary complain today is No chief complaint on file.  Pertinent problems: Cynthia Bennett has Fibromyalgia; Chronic pain syndrome; Lumbar radicular pain; Chronic low back pain (1ry area of Pain) (Bilateral) (L>R) w/o sciatica; Grade 1 spondylolisthesis of L4 over L5; Lumbar discogenic pain syndrome; Lumbar facet syndrome (Bilateral) (L>R); Lumbar facet hypertrophy (Bilateral); Chronic sacroiliac joint pain (Left); Chronic hip pain (Left); Lumbar spondylosis; Abnormal MRI, lumbar spine (06/25/2016); Cervical spondylosis (Anterolisthesis of C5 over C6); Chronic lower extremity pain (2ry area of Pain) (Left); Chronic lumbar radicular pain (Left) (L5 Dermatome); Osteoarthritis of hip (Left); Chronic sacroiliac joint pain (Bilateral) (R>L); Chronic shoulder pain (Left); Osteoarthritis of shoulder (Left); Chronic sacroiliac joint pain (Right); Chronic hip pain (Right); Left upper arm pain; Myofascial pain syndrome (left upper extremity); Acromioclavicular joint pain (Left);  Osteoarthritis of AC (acromioclavicular) joint (Left); Supraspinatus tendon tear, sequela (Left); Spondylosis without myelopathy or radiculopathy, lumbosacral region; Other specified dorsopathies, sacral and sacrococcygeal region; Bertolotti's syndrome (Left); Severe (5-6 mm) L4-5 lumbar spinal stenosis and (8 mm) L3-4 spinal stenosis.; Osteoarthritis of hip (Right); Trigger point with back pain (Right); Chronic myofascial pain; Chronic low back pain (Right) w/o sciatica; Chronic hip pain (3ry area of Pain) (Bilateral) (R>L); Osteoarthritis of hips (Bilateral); Chronic low back pain (Left) w/o sciatica; Thoracic back pain (Left); DDD (degenerative disc disease), thoracic; DDD (degenerative disc disease), thoracolumbar; Scoliosis of thoracolumbar region due to degenerative disease of spine in adult; Weakness of leg (Left); Numbness and tingling of left leg; Lumbar facet joint pain; Pseudoarthrosis of lumbar spine; DDD (degenerative disc disease), lumbar w/ LEP; Chronic low back pain (Bilateral) (L>R) w/ sciatica (Left); Low back pain of over 3 months duration; Low back pain radiating to left leg; Intermittent low back pain; Multifactorial low back pain; and Recurrent low back pain on their pertinent problem list.  Pain Assessment: Severity of   is reported as a  /10. Location:    / . Onset:  . Quality:  . Timing:  . Modifying factor(s):  SABRA Vitals:  vitals were not taken for this visit.  BMI: Estimated body mass index is 25.97 kg/m as calculated from the following:   Height as of 10/20/24: 5' 2 (1.575 m).   Weight as of 10/20/24: 142 lb (64.4 kg).  Last encounter: 08/10/2024. Last procedure: 10/20/2024.  Reason for encounter: post-procedure evaluation and assessment.   Discussed the use of AI scribe software for clinical note transcription with the patient, who gave verbal consent to proceed.  History of Present Illness          Post-Procedure Evaluation   Type: Lumbar epidural steroid injection  (LESI) (interlaminar) #11  Laterality: Left   Level:  L4-5 Level.  Imaging: Fluoroscopic guidance         Anesthesia: Local anesthesia (1-2% Lidocaine ) Anxiolysis: None                            Sedation: No Sedation                       DOS: 10/20/2024  Performed by: Cynthia DELENA Como, MD  Purpose: Diagnostic/Therapeutic Indications: Lumbar radicular pain of intraspinal etiology of more than 4 weeks that has failed to respond to conservative therapy and is severe enough to impact quality of life or function. 1. Chronic low back pain (Bilateral) (L>R) w/ sciatica (Left)   2. Chronic lower extremity pain (2ry area of Pain) (Left)   3. Chronic lumbar radicular pain (Left) (L5 Dermatome)   4. DDD (degenerative disc disease), lumbar w/ LEP   5. Grade 1 spondylolisthesis of L4 over L5   6. Low back pain of over 3 months duration   7. Low back pain radiating to left leg   8. Lumbar discogenic pain syndrome   9. Multifactorial low back pain   10. Severe (5-6 mm) L4-5 lumbar spinal stenosis and (8 mm) L3-4 spinal stenosis.   11. Recurrent low back pain   12. Intermittent low back pain   13. Advanced age    NAS-11 Pain score:   Pre-procedure: 6 /10   Post-procedure: 6 /10     Effectiveness:  Initial hour after procedure:   ***. Subsequent 4-6 hours post-procedure:   ***. Analgesia past initial 6 hours:   ***. Ongoing improvement:  Analgesic:  *** Function:    ***    ROM:    ***    Interpretation: ***  Pharmacotherapy Assessment   Oxycodone  IR 5 mg, 1 tab PO daily (maximum of 5 mg/day of oxycodone .) MME/day: 7.5 mg/day.   Monitoring: [place] PMP: PDMP reviewed during this encounter.       Pharmacotherapy: No side-effects or adverse reactions reported. Compliance: No problems identified. Effectiveness: Clinically acceptable.  No notes on file  UDS:  Summary  Date Value Ref Range Status  04/11/2024 FINAL  Final    Comment:     ==================================================================== [employer] Select 13 (MW) ==================================================================== Test                             Result       Flag       Units  Drug Present and Declared for Prescription Verification   Oxycodone                       278          EXPECTED   ng/mg creat   Oxymorphone                    84           EXPECTED   ng/mg creat   Noroxycodone                   2007         EXPECTED   ng/mg creat    Sources of oxycodone  include scheduled prescription medications.    Oxymorphone and noroxycodone are expected metabolites of oxycodone .    Oxymorphone is also available as a scheduled prescription medication.  ==================================================================== Test  Result    Flag   Units      Ref Range   Creatinine              107              mg/dL      >=79 ==================================================================== Declared Medications:  The flagging and interpretation on this report are based on the  following declared medications.  Unexpected results may arise from  inaccuracies in the declared medications.   **Note: The testing scope of this panel includes these medications:   Oxycodone  (OxyIR)   **Note: The testing scope of this panel does not include the  following reported medications:   Acetaminophen  (Tylenol )  Chlorhexidine (Peridex)  Diclofenac (Voltaren)  Doxepin (Sinequan)  Esomeprazole (Nexium)  Eye Drops  Guaifenesin  Magnesium (Mag-Ox)  Naloxone  (Narcan )  Ondansetron  (Zofran )  Sertraline (Zoloft)  Vitamin B12  Vitamin D3  Zolpidem (Ambien) ==================================================================== For clinical consultation, please call [phone]. ====================================================================     No results found for: CBDTHCR No results found for: D8THCCBX No results found for:  D9THCCBX  ROS  Constitutional: Denies any fever or chills Gastrointestinal: No reported hemesis, hematochezia, vomiting, or acute GI distress Musculoskeletal: Denies any acute onset joint swelling, redness, loss of ROM, or weakness Neurological: No reported episodes of acute onset apraxia, aphasia, dysarthria, agnosia, amnesia, paralysis, loss of coordination, or loss of consciousness  Medication Review  Guaifenesin, Vitamin D3, acetaminophen , aspirin EC, chlorhexidine, cyanocobalamin , diclofenac Sodium, dorzolamide-timolol, doxepin, esomeprazole, magnesium oxide, ondansetron , oxyCODONE , sertraline, and zolpidem  History Review  Allergy: Cynthia Bennett is allergic to ibuprofen. Drug: Cynthia Bennett  reports no history of drug use. Alcohol:  reports no history of alcohol use. Tobacco:  reports that she has quit smoking. She has never used smokeless tobacco. Social: Cynthia Bennett  reports that she has quit smoking. She has never used smokeless tobacco. She reports that she does not drink alcohol and does not use drugs. Medical:  has a past medical history of Dizzy spells, Fibromyalgia, Glaucoma, Hiatal hernia, Osteoarthritis, Osteoarthritis of spine with radiculopathy, lumbar region (08/14/2015), Peptic ulcer disease, Reflux, Spinal stenosis, Stroke (HCC), and TIA (transient ischemic attack). Surgical: Cynthia Bennett  has a past surgical history that includes Esophagogastroduodenoscopy (N/A, 04/16/2015); Appendectomy; and Abdominal hysterectomy. Family: family history includes COPD in her father; Mental illness in her mother.  Laboratory Chemistry Profile   Renal Lab Results  Component Value Date   BUN 16 09/13/2024   CREATININE 1.03 (H) 09/13/2024   BCR 11 (L) 01/10/2019   GFRAA 46 (L) 01/10/2019   GFRNONAA 52 (L) 09/13/2024    Hepatic Lab Results  Component Value Date   AST 20 05/27/2024   ALT 10 05/27/2024   ALBUMIN 3.7 05/27/2024   ALKPHOS 61 05/27/2024   LIPASE 139 04/27/2014    Electrolytes Lab  Results  Component Value Date   NA 135 09/13/2024   Bennett 4.2 09/13/2024   CL 105 09/13/2024   CALCIUM 9.2 09/13/2024   MG 2.3 01/10/2019    Bone Lab Results  Component Value Date   25OHVITD1 26 (L) 01/10/2019   25OHVITD2 1.6 01/10/2019   25OHVITD3 24 01/10/2019    Inflammation (CRP: Acute Phase) (ESR: Chronic Phase) Lab Results  Component Value Date   CRP 0.9 08/24/2020   ESRSEDRATE 51 (H) 01/10/2019         Note: Above Lab results reviewed.  Recent Imaging Review  DG PAIN CLINIC C-ARM 1-60 MIN NO REPORT Fluoro was used, but no Radiologist  interpretation will be provided.  Please refer to NOTES tab for provider progress note. Note: Reviewed        Physical Exam  Vitals: There were no vitals taken for this visit. BMI: Estimated body mass index is 25.97 kg/m as calculated from the following:   Height as of 10/20/24: 5' 2 (1.575 m).   Weight as of 10/20/24: 142 lb (64.4 kg). Ideal: Patient weight not recorded General appearance: Well nourished, well developed, and well hydrated. In no apparent acute distress Mental status: Alert, oriented x 3 (person, place, & time)       Respiratory: No evidence of acute respiratory distress Eyes: PERLA   Assessment   Diagnosis Status  1. Chronic low back pain (Bilateral) (L>R) w/ sciatica (Left)   2. Chronic lower extremity pain (2ry area of Pain) (Left)   3. Chronic lumbar radicular pain (Left) (L5 Dermatome)   4. Postop check    Controlled Controlled Controlled   Updated Problems: No problems updated.  Plan of Care  Problem-specific:  Assessment and Plan            Cynthia Bennett has a current medication list which includes the following long-term medication(s): doxepin, oxycodone , oxycodone , [START ON 12/16/2024] oxycodone , and zolpidem.  Pharmacotherapy (Medications Ordered): No orders of the defined types were placed in this encounter.  Orders:  No orders of the defined types were placed in this encounter.     Interventional Therapies  Risk Factors  Complex Considerations:  Advanced age   Planned  Pending:   Palliative left L4-5 LESI #9    Under consideration:   Palliative left L4-5 and L5-S1 lumbar facet MBB  Possible lumbar facet RFA    Completed:   Diagnostic left L5-S1 pseudoarthrosis inj. #1 (01/09/2022) (10/10 to 0/10)  Therapeutic left L4-5 LESI x8 (06/25/2021) (100/100/80/90)  Therapeutic right L4-5 LESI x1 (07/21/2017) (DNR-F/U)  Palliative right L5-S1 LESI x1 (09/27/2015) (100/100/90)  Therapeutic left L5-S1 LESI x1 (02/05/2016) (100/100/100/100)  Diagnostic right SI joint injection x2 (08/25/2019) (100/100/40/30)  Diagnostic left SI joint injection x2 (01/27/2019) (100/100/100/100)  Diagnostic left glenohumeral joint inj. x1 (04/29/2017) (100/100/98/98)  Left biceps muscle trigger point inj. x1 (07/21/2017) (DNR-F/U)  Right PSIS trigger point inj. x1 (07/27/2018) (100/100/50/>50)  Diagnostic right lumbar facet MBB x3 (01/09/2022) (10/10 to 0/10)  Diagnostic left L4-5 and L5-S1 lumbar facet MBB x5 (09/29/2023) (100/100/100/100)  Diagnostic left IA hip inj. x1 (01/27/2019) (100/100/100/100)    Completed by other providers:   None at this time   Therapeutic  Palliative (PRN) options:   None established      No follow-ups on file.    Recent Visits Date Type Provider Dept  10/20/24 Procedure visit Tanya Glisson, MD [facility]  10/10/24 Office Visit Patel, Seema K, NP [facility]  Showing recent visits within past 90 days and meeting all other requirements Future Appointments Date Type Provider Dept  11/30/24 Appointment Tanya Glisson, MD [facility]  01/05/25 Appointment Patel, Seema K, NP [facility]  Showing future appointments within next 90 days and meeting all other requirements  I discussed the assessment and treatment plan with the patient. The patient was provided an opportunity to ask questions and all were  answered. The patient agreed with the plan and demonstrated an understanding of the instructions.  Patient advised to call back or seek an in-person evaluation if the symptoms or condition worsens.  Duration of encounter: *** minutes.  Total time on encounter, as per [facility]  guidelines included both the face-to-face and non-face-to-face time personally spent by the physician and/or other qualified health care professional(s) on the day of the encounter (includes time in activities that require the physician or other qualified health care professional and does not include time in activities normally performed by clinical staff). Physician's time may include the following activities when performed: Preparing to see the patient (e.g., pre-charting review of records, searching for previously ordered imaging, lab work, and nerve conduction tests) Review of prior analgesic pharmacotherapies. Reviewing PMP Interpreting ordered tests (e.g., lab work, imaging, nerve conduction tests) Performing post-procedure evaluations, including interpretation of diagnostic procedures Obtaining and/or reviewing separately obtained history Performing a medically appropriate examination and/or evaluation Counseling and educating the patient/family/caregiver Ordering medications, tests, or procedures Referring and communicating with other health care professionals (when not separately reported) Documenting clinical information in the electronic or other health record Independently interpreting results (not separately reported) and communicating results to the patient/ family/caregiver Care coordination (not separately reported)  Note by: Cynthia DELENA Como, MD (TTS and AI technology used. I apologize for any typographical errors that were not detected and corrected.) Date: 11/30/2024; Time: 5:15 PM

## 2024-11-30 ENCOUNTER — Ambulatory Visit (HOSPITAL_BASED_OUTPATIENT_CLINIC_OR_DEPARTMENT_OTHER): Admitting: Pain Medicine

## 2024-11-30 DIAGNOSIS — G8929 Other chronic pain: Secondary | ICD-10-CM

## 2024-11-30 DIAGNOSIS — Z09 Encounter for follow-up examination after completed treatment for conditions other than malignant neoplasm: Secondary | ICD-10-CM

## 2024-11-30 DIAGNOSIS — Z91199 Patient's noncompliance with other medical treatment and regimen due to unspecified reason: Secondary | ICD-10-CM

## 2024-11-30 NOTE — Patient Instructions (Signed)

## 2025-01-05 ENCOUNTER — Encounter: Admitting: Nurse Practitioner

## 2025-01-11 ENCOUNTER — Inpatient Hospital Stay

## 2025-01-11 ENCOUNTER — Inpatient Hospital Stay: Admitting: Internal Medicine
